# Patient Record
Sex: Male | Born: 1935 | Race: White | Hispanic: No | Smoking: Former smoker
Health system: Southern US, Community
[De-identification: ages and names within clinical notes are randomized; demographics above are authoritative.]

## PROBLEM LIST (undated history)

## (undated) DIAGNOSIS — N200 Calculus of kidney: Secondary | ICD-10-CM

## (undated) DIAGNOSIS — I714 Abdominal aortic aneurysm, without rupture, unspecified: Secondary | ICD-10-CM

## (undated) DIAGNOSIS — J189 Pneumonia, unspecified organism: Secondary | ICD-10-CM

## (undated) DIAGNOSIS — E785 Hyperlipidemia, unspecified: Secondary | ICD-10-CM

## (undated) DIAGNOSIS — I219 Acute myocardial infarction, unspecified: Secondary | ICD-10-CM

## (undated) DIAGNOSIS — J40 Bronchitis, not specified as acute or chronic: Secondary | ICD-10-CM

## (undated) DIAGNOSIS — I2510 Atherosclerotic heart disease of native coronary artery without angina pectoris: Secondary | ICD-10-CM

## (undated) DIAGNOSIS — H9190 Unspecified hearing loss, unspecified ear: Secondary | ICD-10-CM

## (undated) DIAGNOSIS — M1990 Unspecified osteoarthritis, unspecified site: Secondary | ICD-10-CM

## (undated) DIAGNOSIS — Z9109 Other allergy status, other than to drugs and biological substances: Secondary | ICD-10-CM

## (undated) DIAGNOSIS — K219 Gastro-esophageal reflux disease without esophagitis: Secondary | ICD-10-CM

## (undated) DIAGNOSIS — C801 Malignant (primary) neoplasm, unspecified: Secondary | ICD-10-CM

## (undated) DIAGNOSIS — I10 Essential (primary) hypertension: Secondary | ICD-10-CM

## (undated) DIAGNOSIS — I4510 Unspecified right bundle-branch block: Secondary | ICD-10-CM

## (undated) HISTORY — DX: Essential (primary) hypertension: I10

## (undated) HISTORY — DX: Unspecified osteoarthritis, unspecified site: M19.90

## (undated) HISTORY — PX: CARDIAC CATHETERIZATION: SHX172

## (undated) HISTORY — PX: COLONOSCOPY W/ POLYPECTOMY: SHX1380

## (undated) HISTORY — DX: Atherosclerotic heart disease of native coronary artery without angina pectoris: I25.10

## (undated) HISTORY — DX: Abdominal aortic aneurysm, without rupture: I71.4

## (undated) HISTORY — PX: EYE SURGERY: SHX253

## (undated) HISTORY — PX: HERNIA REPAIR: SHX51

## (undated) HISTORY — DX: Hyperlipidemia, unspecified: E78.5

## (undated) HISTORY — DX: Abdominal aortic aneurysm, without rupture, unspecified: I71.40

## (undated) HISTORY — DX: Malignant (primary) neoplasm, unspecified: C80.1

---

## 1966-07-25 DIAGNOSIS — N200 Calculus of kidney: Secondary | ICD-10-CM

## 1966-07-25 HISTORY — DX: Calculus of kidney: N20.0

## 1990-07-25 DIAGNOSIS — I219 Acute myocardial infarction, unspecified: Secondary | ICD-10-CM

## 1990-07-25 HISTORY — DX: Acute myocardial infarction, unspecified: I21.9

## 1991-03-01 HISTORY — PX: CORONARY ARTERY BYPASS GRAFT: SHX141

## 1993-08-13 HISTORY — PX: ABDOMINAL AORTIC ANEURYSM REPAIR: SUR1152

## 1994-04-20 HISTORY — PX: OTHER SURGICAL HISTORY: SHX169

## 1997-07-25 HISTORY — PX: OTHER SURGICAL HISTORY: SHX169

## 1997-11-18 ENCOUNTER — Ambulatory Visit (HOSPITAL_COMMUNITY): Admission: RE | Admit: 1997-11-18 | Discharge: 1997-11-18 | Payer: Self-pay | Admitting: *Deleted

## 1998-04-29 ENCOUNTER — Inpatient Hospital Stay (HOSPITAL_COMMUNITY): Admission: AD | Admit: 1998-04-29 | Discharge: 1998-05-01 | Payer: Self-pay | Admitting: Internal Medicine

## 1998-04-30 ENCOUNTER — Encounter: Payer: Self-pay | Admitting: General Surgery

## 1998-09-24 ENCOUNTER — Inpatient Hospital Stay (HOSPITAL_COMMUNITY): Admission: AD | Admit: 1998-09-24 | Discharge: 1998-10-01 | Payer: Self-pay | Admitting: *Deleted

## 1998-09-25 ENCOUNTER — Encounter: Payer: Self-pay | Admitting: General Surgery

## 1998-09-26 ENCOUNTER — Encounter: Payer: Self-pay | Admitting: Internal Medicine

## 1998-09-26 ENCOUNTER — Encounter: Payer: Self-pay | Admitting: General Surgery

## 1998-09-28 ENCOUNTER — Encounter: Payer: Self-pay | Admitting: Internal Medicine

## 1999-12-13 ENCOUNTER — Encounter: Payer: Self-pay | Admitting: *Deleted

## 1999-12-13 ENCOUNTER — Encounter: Admission: RE | Admit: 1999-12-13 | Discharge: 1999-12-13 | Payer: Self-pay | Admitting: *Deleted

## 2001-08-04 ENCOUNTER — Encounter: Payer: Self-pay | Admitting: Ophthalmology

## 2001-08-04 ENCOUNTER — Inpatient Hospital Stay (HOSPITAL_COMMUNITY): Admission: EM | Admit: 2001-08-04 | Discharge: 2001-08-05 | Payer: Self-pay | Admitting: Emergency Medicine

## 2004-09-09 ENCOUNTER — Ambulatory Visit: Payer: Self-pay | Admitting: *Deleted

## 2004-09-10 ENCOUNTER — Ambulatory Visit: Payer: Self-pay | Admitting: Internal Medicine

## 2004-09-15 ENCOUNTER — Ambulatory Visit: Payer: Self-pay | Admitting: *Deleted

## 2004-09-29 ENCOUNTER — Ambulatory Visit: Payer: Self-pay | Admitting: *Deleted

## 2004-10-29 ENCOUNTER — Ambulatory Visit: Payer: Self-pay | Admitting: *Deleted

## 2004-11-04 ENCOUNTER — Ambulatory Visit: Payer: Self-pay | Admitting: Cardiology

## 2005-01-28 ENCOUNTER — Ambulatory Visit: Payer: Self-pay | Admitting: *Deleted

## 2005-02-10 ENCOUNTER — Ambulatory Visit: Payer: Self-pay | Admitting: Cardiology

## 2005-03-08 ENCOUNTER — Ambulatory Visit: Payer: Self-pay

## 2005-03-22 ENCOUNTER — Ambulatory Visit: Payer: Self-pay | Admitting: *Deleted

## 2005-05-27 ENCOUNTER — Ambulatory Visit: Payer: Self-pay | Admitting: *Deleted

## 2005-08-31 ENCOUNTER — Ambulatory Visit: Payer: Self-pay | Admitting: *Deleted

## 2005-11-18 ENCOUNTER — Ambulatory Visit: Payer: Self-pay | Admitting: *Deleted

## 2005-11-24 ENCOUNTER — Ambulatory Visit: Payer: Self-pay | Admitting: Cardiology

## 2006-03-07 ENCOUNTER — Ambulatory Visit: Payer: Self-pay | Admitting: *Deleted

## 2006-03-08 ENCOUNTER — Ambulatory Visit: Payer: Self-pay

## 2006-04-26 ENCOUNTER — Encounter: Payer: Self-pay | Admitting: Cardiology

## 2006-04-26 ENCOUNTER — Ambulatory Visit: Payer: Self-pay | Admitting: *Deleted

## 2006-04-26 ENCOUNTER — Ambulatory Visit: Payer: Self-pay

## 2006-05-05 ENCOUNTER — Ambulatory Visit: Payer: Self-pay | Admitting: *Deleted

## 2006-05-05 LAB — CONVERTED CEMR LAB
ALT: 19 units/L (ref 0–40)
Albumin: 4.2 g/dL (ref 3.5–5.2)
Alkaline Phosphatase: 41 units/L (ref 39–117)
Chloride: 108 meq/L (ref 96–112)
Glucose, Bld: 100 mg/dL — ABNORMAL HIGH (ref 70–99)
LDL Cholesterol: 91 mg/dL (ref 0–99)
Potassium: 4.4 meq/L (ref 3.5–5.1)
Sodium: 140 meq/L (ref 135–145)
VLDL: 17 mg/dL (ref 0–40)

## 2006-05-11 ENCOUNTER — Ambulatory Visit: Payer: Self-pay | Admitting: Internal Medicine

## 2006-06-07 ENCOUNTER — Ambulatory Visit: Payer: Self-pay | Admitting: *Deleted

## 2006-06-08 ENCOUNTER — Ambulatory Visit: Payer: Self-pay

## 2006-10-20 ENCOUNTER — Ambulatory Visit: Payer: Self-pay | Admitting: *Deleted

## 2006-10-20 LAB — CONVERTED CEMR LAB
Alkaline Phosphatase: 42 units/L (ref 39–117)
Bilirubin, Direct: 0.2 mg/dL (ref 0.0–0.3)
Cholesterol: 123 mg/dL (ref 0–200)
LDL Cholesterol: 74 mg/dL (ref 0–99)
Total CHOL/HDL Ratio: 4.5
Total Protein: 6.2 g/dL (ref 6.0–8.3)

## 2006-10-26 ENCOUNTER — Ambulatory Visit: Payer: Self-pay | Admitting: Cardiology

## 2007-01-03 ENCOUNTER — Ambulatory Visit: Payer: Self-pay | Admitting: *Deleted

## 2007-01-03 LAB — CONVERTED CEMR LAB
Calcium: 9.9 mg/dL (ref 8.4–10.5)
Chloride: 103 meq/L (ref 96–112)
GFR calc non Af Amer: 77 mL/min
Glucose, Bld: 92 mg/dL (ref 70–99)

## 2007-04-12 ENCOUNTER — Ambulatory Visit: Payer: Self-pay | Admitting: *Deleted

## 2007-04-20 ENCOUNTER — Ambulatory Visit: Payer: Self-pay | Admitting: Cardiovascular Disease

## 2007-04-20 LAB — CONVERTED CEMR LAB
Bilirubin, Direct: 0.2 mg/dL (ref 0.0–0.3)
Cholesterol: 143 mg/dL (ref 0–200)
HDL: 24.5 mg/dL — ABNORMAL LOW (ref >39.0)
Total Bilirubin: 1.1 mg/dL (ref 0.3–1.2)
Total CHOL/HDL Ratio: 5.8
Total Protein: 6.2 g/dL (ref 6.0–8.3)
Triglycerides: 115 mg/dL (ref 0–149)

## 2007-04-26 ENCOUNTER — Ambulatory Visit: Payer: Self-pay | Admitting: Cardiology

## 2007-05-01 ENCOUNTER — Ambulatory Visit: Payer: Self-pay | Admitting: Cardiovascular Disease

## 2007-05-29 ENCOUNTER — Ambulatory Visit: Payer: Self-pay

## 2007-10-05 ENCOUNTER — Ambulatory Visit: Payer: Self-pay | Admitting: Cardiovascular Disease

## 2007-10-05 LAB — CONVERTED CEMR LAB
ALT: 19 units/L (ref 0–53)
AST: 15 units/L (ref 0–37)
Bilirubin, Direct: 0.2 mg/dL (ref 0.0–0.3)
Cholesterol: 140 mg/dL (ref 0–200)
HDL: 25.3 mg/dL — ABNORMAL LOW (ref >39.0)
Total Protein: 6.1 g/dL (ref 6.0–8.3)

## 2007-10-11 ENCOUNTER — Ambulatory Visit: Payer: Self-pay | Admitting: Cardiology

## 2007-12-28 ENCOUNTER — Ambulatory Visit: Payer: Self-pay | Admitting: Cardiovascular Disease

## 2007-12-28 LAB — CONVERTED CEMR LAB
ALT: 16 units/L (ref 0–53)
AST: 15 units/L (ref 0–37)
Bilirubin, Direct: 0.2 mg/dL (ref 0.0–0.3)
Total Bilirubin: 0.8 mg/dL (ref 0.3–1.2)
Total CHOL/HDL Ratio: 5.2

## 2008-01-03 ENCOUNTER — Ambulatory Visit: Payer: Self-pay | Admitting: Cardiology

## 2008-04-17 ENCOUNTER — Ambulatory Visit: Payer: Self-pay | Admitting: *Deleted

## 2008-06-03 ENCOUNTER — Ambulatory Visit: Admission: RE | Admit: 2008-06-03 | Discharge: 2008-08-29 | Payer: Self-pay | Admitting: Radiation Oncology

## 2008-06-20 ENCOUNTER — Ambulatory Visit: Payer: Self-pay | Admitting: Cardiology

## 2008-06-20 LAB — CONVERTED CEMR LAB
Albumin: 4 g/dL (ref 3.5–5.2)
Alkaline Phosphatase: 38 units/L — ABNORMAL LOW (ref 39–117)
Bilirubin, Direct: 0.2 mg/dL (ref 0.0–0.3)
LDL Cholesterol: 75 mg/dL (ref 0–99)
Total CHOL/HDL Ratio: 3.8
Triglycerides: 100 mg/dL (ref 0–149)
VLDL: 20 mg/dL (ref 0–40)

## 2008-06-26 ENCOUNTER — Ambulatory Visit: Payer: Self-pay | Admitting: Cardiology

## 2008-07-07 ENCOUNTER — Ambulatory Visit: Payer: Self-pay | Admitting: Cardiovascular Disease

## 2008-07-25 DIAGNOSIS — Z8546 Personal history of malignant neoplasm of prostate: Secondary | ICD-10-CM

## 2008-07-25 DIAGNOSIS — C801 Malignant (primary) neoplasm, unspecified: Secondary | ICD-10-CM

## 2008-07-25 HISTORY — DX: Personal history of malignant neoplasm of prostate: Z85.46

## 2008-07-25 HISTORY — DX: Malignant (primary) neoplasm, unspecified: C80.1

## 2008-08-29 ENCOUNTER — Ambulatory Visit: Admission: RE | Admit: 2008-08-29 | Discharge: 2008-10-10 | Payer: Self-pay | Admitting: Radiation Oncology

## 2008-12-19 ENCOUNTER — Ambulatory Visit: Payer: Self-pay | Admitting: Cardiovascular Disease

## 2008-12-25 ENCOUNTER — Ambulatory Visit: Payer: Self-pay | Admitting: Internal Medicine

## 2008-12-26 LAB — CONVERTED CEMR LAB
ALT: 14 units/L (ref 0–53)
AST: 17 units/L (ref 0–37)
Albumin: 3.9 g/dL (ref 3.5–5.2)
HDL: 33.9 mg/dL — ABNORMAL LOW (ref >39.00)
Total Bilirubin: 0.9 mg/dL (ref 0.3–1.2)
Total Protein: 6.3 g/dL (ref 6.0–8.3)
Triglycerides: 73 mg/dL (ref 0.0–149.0)
VLDL: 14.6 mg/dL (ref 0.0–40.0)

## 2008-12-28 DIAGNOSIS — E782 Mixed hyperlipidemia: Secondary | ICD-10-CM | POA: Insufficient documentation

## 2009-01-16 DIAGNOSIS — I739 Peripheral vascular disease, unspecified: Secondary | ICD-10-CM | POA: Insufficient documentation

## 2009-01-16 DIAGNOSIS — Z9889 Other specified postprocedural states: Secondary | ICD-10-CM | POA: Insufficient documentation

## 2009-01-16 DIAGNOSIS — E785 Hyperlipidemia, unspecified: Secondary | ICD-10-CM | POA: Insufficient documentation

## 2009-01-16 DIAGNOSIS — I25810 Atherosclerosis of coronary artery bypass graft(s) without angina pectoris: Secondary | ICD-10-CM | POA: Insufficient documentation

## 2009-01-16 DIAGNOSIS — I10 Essential (primary) hypertension: Secondary | ICD-10-CM | POA: Insufficient documentation

## 2009-04-01 ENCOUNTER — Ambulatory Visit: Payer: Self-pay | Admitting: Cardiovascular Disease

## 2009-04-02 ENCOUNTER — Telehealth: Payer: Self-pay | Admitting: Cardiovascular Disease

## 2009-04-06 ENCOUNTER — Telehealth: Payer: Self-pay | Admitting: Cardiovascular Disease

## 2009-04-13 ENCOUNTER — Telehealth: Payer: Self-pay | Admitting: Cardiovascular Disease

## 2009-04-24 ENCOUNTER — Ambulatory Visit: Payer: Self-pay | Admitting: Vascular Surgery

## 2009-05-22 ENCOUNTER — Ambulatory Visit: Payer: Self-pay | Admitting: Cardiology

## 2009-05-28 ENCOUNTER — Ambulatory Visit: Payer: Self-pay | Admitting: Internal Medicine

## 2009-06-02 LAB — CONVERTED CEMR LAB
ALT: 14 units/L (ref 0–53)
AST: 19 units/L (ref 0–37)
Albumin: 3.8 g/dL (ref 3.5–5.2)
Alkaline Phosphatase: 48 units/L (ref 39–117)
Bilirubin, Direct: 0.2 mg/dL (ref 0.0–0.3)
Cholesterol: 141 mg/dL (ref 0–200)
LDL Cholesterol: 95 mg/dL (ref 0–99)
Total Protein: 6 g/dL (ref 6.0–8.3)
Triglycerides: 69 mg/dL (ref 0.0–149.0)

## 2009-08-28 ENCOUNTER — Ambulatory Visit: Payer: Self-pay | Admitting: Cardiovascular Disease

## 2009-08-29 LAB — CONVERTED CEMR LAB
ALT: 18 units/L (ref 0–53)
AST: 16 units/L (ref 0–37)
Alkaline Phosphatase: 52 units/L (ref 39–117)
Bilirubin, Direct: 0.2 mg/dL (ref 0.0–0.3)
Total Bilirubin: 0.7 mg/dL (ref 0.3–1.2)
Total CHOL/HDL Ratio: 4

## 2009-09-03 ENCOUNTER — Ambulatory Visit: Payer: Self-pay | Admitting: Cardiology

## 2009-10-09 ENCOUNTER — Emergency Department (HOSPITAL_COMMUNITY): Admission: EM | Admit: 2009-10-09 | Discharge: 2009-10-09 | Payer: Self-pay | Admitting: Emergency Medicine

## 2009-12-18 ENCOUNTER — Ambulatory Visit: Payer: Self-pay | Admitting: Vascular Surgery

## 2009-12-18 ENCOUNTER — Encounter: Payer: Self-pay | Admitting: Cardiovascular Disease

## 2009-12-29 ENCOUNTER — Ambulatory Visit: Payer: Self-pay | Admitting: Cardiovascular Disease

## 2010-01-28 ENCOUNTER — Ambulatory Visit (HOSPITAL_COMMUNITY): Admission: RE | Admit: 2010-01-28 | Discharge: 2010-01-28 | Payer: Self-pay | Admitting: Gastroenterology

## 2010-02-26 ENCOUNTER — Ambulatory Visit: Payer: Self-pay | Admitting: Cardiovascular Disease

## 2010-03-02 LAB — CONVERTED CEMR LAB
Albumin: 4.1 g/dL (ref 3.5–5.2)
Alkaline Phosphatase: 81 units/L (ref 39–117)
Cholesterol: 152 mg/dL (ref 0–200)
HDL: 25.3 mg/dL — ABNORMAL LOW (ref >39.00)
LDL Cholesterol: 107 mg/dL — ABNORMAL HIGH (ref 0–99)
Total CHOL/HDL Ratio: 6
Total Protein: 6.2 g/dL (ref 6.0–8.3)
Triglycerides: 98 mg/dL (ref 0.0–149.0)

## 2010-03-04 ENCOUNTER — Ambulatory Visit: Payer: Self-pay | Admitting: Cardiovascular Disease

## 2010-06-16 ENCOUNTER — Ambulatory Visit: Payer: Self-pay | Admitting: Vascular Surgery

## 2010-08-26 NOTE — Assessment & Plan Note (Signed)
Summary: rov-tp   Visit Type:  Follow-up Primary Provider:  Lanell Persons, MD   History of Present Illness: [name] has been doing reasonably well.  Still adjusting to being a widower and cooking for himself.  He has been trying to walk for exercise but not very consistently. He denies any new or significant muscle aches or pains.  He has been compliant with his medications including simvastatin 40mg  and fenofibrate 200mg .   Lipid Management History:      Positive NCEP/ATP III risk factors include male age 75 years old or older, HDL cholesterol less than 40, hypertension, ASHD (either angina/prior MI/prior CABG), and peripheral vascular disease.  Negative NCEP/ATP III risk factors include non-diabetic.     Jesse [name] is seen back in the lipid clinic for continued evaluation and medication titration associated with his mixed hyperlipidemia.  Jesse [name] relates that his wife passed away in 09/10/2022 of this year, and states that he has not exercised since his cancer diagnosis earlier this year.  He completed 40 radiation treatments in March 2010.    Diet therapy:  Jesse [name] has been eating out more at [employer], and works to select low-fat and low-cholesterol foods.  When at home, he has selected soups, sandwiches, and healthy choice meal selections.   Jesse [name] has maintained compliance and tolerance of crestor and fenofibrate throughout this period.    Lipid Management Provider  Charolotte Eke, PharmD  Current Medications (verified): 1)  Carvedilol 12.5 Mg Tabs (Carvedilol) .... Take One Tablet By Mouth Twice A Day 2)  Cozaar 50 Mg Tabs (Losartan Potassium) .... Take One Tablet By Mouth Daily 3)  Simvastatin 40 Mg Tabs (Simvastatin) .... Take One Tablet By Mouth Daily At Bedtime 4)  Fenofibrate Micronized 200 Mg Caps (Fenofibrate Micronized) .... Take 1 Tablet By Mouth Daily 5)  Aspirin 81 Mg Tbec (Aspirin) .... Take One Tablet By Mouth Daily 6)  Claritin 10 Mg Tabs (Loratadine) ....  Take 1 Tablet Day 7)  Acetaminophen 500 Mg  Caps (Acetaminophen) .... As Needed 8)  Hydrochlorothiazide 12.5 Mg Tabs (Hydrochlorothiazide) .... Take One Tablet By Mouth Daily.  Allergies (verified): No Known Drug Allergies   Vital Signs:  Patient profile:   77 year old male Weight:      203 pounds Pulse rate:   72 / minute BP sitting:   120 / 80  (left arm)  Impression & Recommendations:  Problem # 1:  HYPERLIPIDEMIA, BORDERLINE (ICD-272.4) His TC, LDL, and TG are at goals.  HDL is improved but not quite at the goal of >40.  His liver enzymes are normal.  I encouraged him to continue to follow a heart healthy diet and exercise the best he can.  We will see him again in six months.  His updated medication list for this problem includes:    Simvastatin 40 Mg Tabs (Simvastatin) .Marland Kitchen... Take one tablet by mouth daily at bedtime    Fenofibrate Micronized 200 Mg Caps (Fenofibrate micronized) .Marland Kitchen... Take 1 tablet by mouth daily  Lipid Assessment/Plan:      Based on NCEP/ATP III, the patient's risk factor category is "history of coronary disease, peripheral vascular disease, cerebrovascular disease, or aortic aneurysm".  The patient's lipid goals are as follows: Total cholesterol goal is 200; LDL cholesterol goal is 100; HDL cholesterol goal is 40; Triglyceride goal is 150.  His LDL cholesterol goal has been met.

## 2010-08-26 NOTE — Assessment & Plan Note (Signed)
Summary: rov-sl   Jesse [name] is seen back in the lipid clinic for a f/u appointment. Still taking simvastatin 40 mg and fenofibrate 200 mg. No complaints of muscle pain or weakness and no problems obtaining medications.   Diet: Jesse [name] has had difficulty preparing meals at home since his wife passed away in 10/02/08. He eats meals out 3-4x/week, often at [employer]. He chooses vegetables and white meats including chicken and [place]. At home, Jesse [name] prepares low-fat, low-calorie frozen meals such as [employer], [employer], [employer] Choice.  Breakfast: egg beaters 1-2x/week, [employer] sausage biscuits, [place] bacon Snacks: Nabs  ~ peanut butter and crackers Beverages: Sweet tea, coffee, diet coke  Exercise: Jesse [name] has not been able to maintain his exercise routine over the past month due to the heat. He enjoys riding his bike and hopes to resume this activity 2-3x/week once the weather cools off. He goes to the [employer], but does not like to use the treadmill or weights available there.       Lipid Management Caidence Higashi  Jesse [name], PharmD  Preventive Screening-Counseling & Management  Alcohol-Tobacco     Smoking Status: quit > 6 months     Year Quit: 1971  Current Medications (verified): 1)  Carvedilol 12.5 Mg Tabs (Carvedilol) .... Take One Tablet By Mouth Twice A Day 2)  Cozaar 50 Mg Tabs (Losartan Potassium) .... Take One Tablet By Mouth Daily 3)  Simvastatin 40 Mg Tabs (Simvastatin) .... Take One Tablet By Mouth Daily At Bedtime 4)  Fenofibrate Micronized 200 Mg Caps (Fenofibrate Micronized) .... Take 1 Tablet By Mouth Daily 5)  Claritin 10 Mg Tabs (Loratadine) .... Take 1 Tablet Day 6)  Acetaminophen 500 Mg  Caps (Acetaminophen) .... As Needed 7)  Hydrochlorothiazide 12.5 Mg Tabs (Hydrochlorothiazide) .... Take One Tablet By Mouth Daily.  Allergies (verified): No Known Drug Allergies  Past History:  Past medical, surgical, family and social histories  (including risk factors) reviewed, and no changes noted (except as noted below).  Past Medical History: Reviewed history from 01/16/2009 and no changes required. Current Problems:  CAD, ARTERY BYPASS GRAFT (ICD-414.04)- CABG in 1992 PERIPHERAL VASCULAR DISEASE (ICD-443.9) ABDOMINAL AORTIC ANEURYSM REPAIR, HX OF (ICD-V15.1) HYPERTENSION (ICD-401.9) HYPERLIPIDEMIA, BORDERLINE (ICD-272.4) MIXED HYPERLIPIDEMIA (ICD-272.2)  Past Surgical History: Reviewed history from 01/16/2009 and no changes required.  coronary artery bypass graft in 1992  Abdominal aortic aneurysm repair  cataract implant surgery.  ventral hernia repair  Family History: Reviewed history and no changes required.  Social History: Reviewed history from 01/16/2009 and no changes required. Widowed in 2/10.   Lives alone.   He is a retired Psychologist, occupational. He has two children and two grandchildren.  He does not use cigarettes. He does not drink alcohol.    Smoking Status:  quit > 6 months   Vital Signs:  Patient profile:   77 year old male Height:      72 inches Weight:      204 pounds BMI:     27.77 BP sitting:   120 / 78  Impression & Recommendations:  Problem # 1:  HYPERLIPIDEMIA, BORDERLINE (ICD-272.4) Assessment Unchanged Lipids: LDL currently close to goal (LDL < 100). HDL below goal of 40. TC and TG at goal.  TC 152, TG 98, HDL 25, LDL 107.  LFTs WNL. Pt currently compliant and tolerating medication regimen well. Continue current medications.  His updated medication list for this problem includes:    Simvastatin 40 Mg Tabs (Simvastatin) .Marland Kitchen... Take  one tablet by mouth daily at bedtime    Fenofibrate Micronized 200 Mg Caps (Fenofibrate micronized) .Marland Kitchen... Take 1 tablet by mouth daily Exercise: Pt has not exercised over the past month due to the heat, but plans to resume exercise regimen of biking 2-3x/week. Discussed benefits of exercise including increasing HDL.  Diet: Pt attempts to eat a low-fat/low-cal diet,  but has difficulty preparing meals for himself at home. Pt to continue to make healthy choices when eating out and choosing frozen dinners. Discussed excess sodium in frozen meals. Return to clinic for f/u in 6 months.   Patient Instructions: 1)  Lipids: 2)  Total Cholesterol: 152 3)  Triglycerides: 98 4)  HDL ("good" cholesterol): 25 5)  LDL ("bad" cholesterol): 107 6)  Goals before next visit: 7)  1.) Thanks for being here today! 8)  2.) Increase exercise: get out and ride bike or walk once it cools off 9)  3.) Continue to watch diet and intake of fats 10)  Next Visit: 11)  Labs @ [facility]: Friday, March 9th at 8:30 AM 12)  Lipid Clinic: Thursday, March 15th at 1:30

## 2010-08-26 NOTE — Assessment & Plan Note (Signed)
Summary: 9 MONTH      Allergies Added: NKDA  Visit Type:  9 months follow up Primary Provider:  Dr Clarene Duke [facility]  CC:  No complains.  History of Present Illness: [name] is a 77 year old gentleman with coronary artery disease, status post CABG in 1992 as well as previous AAA repair.  He had a followup cardiac cath in 1999, that showed a patent LIMA as well as patent vein grafts to the diagonal OM and sequence graft to the PDA and posterolateral branches of the right coronary artery.  His last Myoview scan was in 2007, that showed a gated LVEF of 44%.  There was a fixed defect in the inferolateral wall, but no ischemia.    He is doing well at present and has no cardiac c/o. He denies chest pain, dyspnea, edema, lightheadedness, or palps.   Current Medications (verified): 1)  Carvedilol 12.5 Mg Tabs (Carvedilol) .... Take One Tablet By Mouth Twice A Day 2)  Cozaar 50 Mg Tabs (Losartan Potassium) .... Take One Tablet By Mouth Daily 3)  Simvastatin 40 Mg Tabs (Simvastatin) .... Take One Tablet By Mouth Daily At Bedtime 4)  Fenofibrate Micronized 200 Mg Caps (Fenofibrate Micronized) .... Take 1 Tablet By Mouth Daily 5)  Claritin 10 Mg Tabs (Loratadine) .... Take 1 Tablet Day 6)  Acetaminophen 500 Mg  Caps (Acetaminophen) .... As Needed 7)  Hydrochlorothiazide 12.5 Mg Tabs (Hydrochlorothiazide) .... Take One Tablet By Mouth Daily.  Allergies (verified): No Known Drug Allergies  Past History:  Past medical history reviewed for relevance to current acute and chronic problems.  Past Medical History: Reviewed history from 01/16/2009 and no changes required. Current Problems:  CAD, ARTERY BYPASS GRAFT (ICD-414.04)- CABG in 1992 PERIPHERAL VASCULAR DISEASE (ICD-443.9) ABDOMINAL AORTIC ANEURYSM REPAIR, HX OF (ICD-V15.1) HYPERTENSION (ICD-401.9) HYPERLIPIDEMIA, BORDERLINE (ICD-272.4) MIXED HYPERLIPIDEMIA (ICD-272.2)  Review of Systems       Positive for mild  rectal bleeding, sinus congestion, otherwise negative except as per history of present illness.   Vital Signs:  Patient profile:   77 year old male Height:      72 inches Weight:      202 pounds BMI:     27.50 Pulse rate:   63 / minute Pulse rhythm:   regular Resp:     18 per minute BP sitting:   122 / 72  (left arm) Cuff size:   large  Vitals Entered By: Vikki Ports (December 29, 2009 4:19 PM)  Physical Exam  General:  Pt is alert and oriented, elderly male, in no acute distress. HEENT: normal Neck: normal carotid upstrokes without bruits, JVP normal Lungs: CTA CV: RRR without murmur or gallop Abd: soft, NT, positive BS, no bruit, no organomegaly Ext: no clubbing, cyanosis, or edema. peripheral pulses 2+ and equal Skin: warm and dry without rash    EKG  Procedure date:  12/29/2009  Findings:      NSR with LBBB, HR 63 bpm.  Impression & Recommendations:  Problem # 1:  CAD, ARTERY BYPASS GRAFT (ICD-414.04) Stable without angina. continue current medical therapy.  The following medications were removed from the medication list:    Aspirin 81 Mg Tbec (Aspirin) .Marland Kitchen... Take one tablet by mouth daily His updated medication list for this problem includes:    Carvedilol 12.5 Mg Tabs (Carvedilol) .Marland Kitchen... Take one tablet by mouth twice a day  Orders: EKG w/ Interpretation (93000)  Problem # 2:  HYPERTENSION (ICD-401.9) BP well-controlled.  The following  medications were removed from the medication list:    Aspirin 81 Mg Tbec (Aspirin) .Marland Kitchen... Take one tablet by mouth daily His updated medication list for this problem includes:    Carvedilol 12.5 Mg Tabs (Carvedilol) .Marland Kitchen... Take one tablet by mouth twice a day    Cozaar 50 Mg Tabs (Losartan potassium) .Marland Kitchen... Take one tablet by mouth daily    Hydrochlorothiazide 12.5 Mg Tabs (Hydrochlorothiazide) .Marland Kitchen... Take one tablet by mouth daily.  Orders: EKG w/ Interpretation (93000)  BP today: 122/72 Prior BP: 120/80  (09/03/2009)  Prior 10 Yr Risk Heart Disease: N/A (09/03/2009)  Labs Reviewed: K+: 4.3 (01/03/2007) Creat: : 1.0 (01/03/2007)   Chol: 139 (08/28/2009)   HDL: 37.20 (08/28/2009)   LDL: 89 (08/28/2009)   TG: 64.0 (08/28/2009)  Problem # 3:  MIXED HYPERLIPIDEMIA (ICD-272.2) Lipids at goal, followed by the lipid clinic. We discussed the importance of increased exercise. Diet has been difficult after becoming a widower. His updated medication list for this problem includes:    Simvastatin 40 Mg Tabs (Simvastatin) .Marland Kitchen... Take one tablet by mouth daily at bedtime    Fenofibrate Micronized 200 Mg Caps (Fenofibrate micronized) .Marland Kitchen... Take 1 tablet by mouth daily  CHOL: 139 (08/28/2009)   LDL: 89 (08/28/2009)   HDL: 37.20 (08/28/2009)   TG: 64.0 (08/28/2009) CHOL (goal): 200 (09/03/2009)   LDL (goal): 100 (09/03/2009)   HDL (goal): 40 (09/03/2009)   TG (goal): 150 (09/03/2009)  Patient Instructions: 1)  Your physician recommends that you continue on your current medications as directed. Please refer to the Current Medication list given to you today. 2)  Your physician wants you to follow-up in:  [facility] 2012. You will receive a reminder letter in the mail two months in advance. If you don't receive a letter, please call our office to schedule the follow-up appointment. 3)  Your physician discussed the importance of regular exercise and recommended that you start or continue a regular exercise program for good health.

## 2010-08-26 NOTE — Letter (Signed)
Summary: [facility]  [facility]   Imported By: Debby Freiberg 01/26/2010 13:25:41  _____________________________________________________________________  External Attachment:    Type:   Image     Comment:   External Document

## 2010-09-23 ENCOUNTER — Encounter: Payer: Self-pay | Admitting: Cardiovascular Disease

## 2010-09-23 ENCOUNTER — Ambulatory Visit (INDEPENDENT_AMBULATORY_CARE_PROVIDER_SITE_OTHER): Payer: Medicare Other | Admitting: Cardiovascular Disease

## 2010-09-23 DIAGNOSIS — E7800 Pure hypercholesterolemia, unspecified: Secondary | ICD-10-CM

## 2010-09-23 DIAGNOSIS — I2510 Atherosclerotic heart disease of native coronary artery without angina pectoris: Secondary | ICD-10-CM

## 2010-09-30 NOTE — Assessment & Plan Note (Signed)
Summary: [id number]/LWB//FROM 12/29/09 OV/LWB/PER PT CALL/MJ/SAF   Visit Type:  Follow-up Primary Provider:  Dr Clarene Duke [facility]  CC:  no complaints.  History of Present Illness: Mr. Crumby is an 77 year old gentleman with coronary artery disease, status post CABG in 1992 as well as previous AAA repair.  He had a followup cardiac cath in 1999, that showed a patent LIMA as well as patent vein grafts to the diagonal OM and sequence graft to the PDA and posterolateral branches of the right coronary artery.  His last Myoview scan was in 2007, that showed a gated LVEF of 44%.  There was a fixed defect in the inferolateral wall, but no ischemia.    The patient is doing well at present. He denies chest pain, dyspnea, edema, orthopnea, PND, or palpitations.  Current Medications (verified): 1)  Carvedilol 12.5 Mg Tabs (Carvedilol) .... Take One Tablet By Mouth Twice A Day 2)  Cozaar 50 Mg Tabs (Losartan Potassium) .... Take One Tablet By Mouth Daily 3)  Simvastatin 40 Mg Tabs (Simvastatin) .... Take One Tablet By Mouth Daily At Bedtime 4)  Fenofibrate Micronized 200 Mg Caps (Fenofibrate Micronized) .... Take 1 Tablet By Mouth Daily 5)  Claritin 10 Mg Tabs (Loratadine) .... Take 1 Tablet Day 6)  Acetaminophen 500 Mg  Caps (Acetaminophen) .... As Needed 7)  Hydrochlorothiazide 12.5 Mg Tabs (Hydrochlorothiazide) .... Take One Tablet By Mouth Daily.  Allergies (verified): No Known Drug Allergies  Past History:  Past medical history reviewed for relevance to current acute and chronic problems.  Past Medical History: Reviewed history from 01/16/2009 and no changes required. Current Problems:  CAD, ARTERY BYPASS GRAFT (ICD-414.04)- CABG in 1992 PERIPHERAL VASCULAR DISEASE (ICD-443.9) ABDOMINAL AORTIC ANEURYSM REPAIR, HX OF (ICD-V15.1) HYPERTENSION (ICD-401.9) HYPERLIPIDEMIA, BORDERLINE (ICD-272.4) MIXED HYPERLIPIDEMIA (ICD-272.2)  Review of Systems       Negative except as per  HPI   Vital Signs:  Patient profile:   77 year old male Height:      72 inches Weight:      205 pounds BMI:     27.90 Pulse rate:   69 / minute Resp:     16 per minute BP sitting:   102 / 64  (left arm) Cuff size:   regular  Vitals Entered By: Hardin Negus, RMA (September 23, 2010 1:34 PM)  Physical Exam  General:  Pt is alert and oriented, elderly male, in no acute distress. HEENT: normal Neck: normal carotid upstrokes without bruits, JVP normal Lungs: CTA CV: RRR without murmur or gallop Abd: soft, obese, NT, positive BS, no organomegaly Ext: no clubbing, cyanosis, or edema. peripheral pulses 2+ and equal Skin: warm and dry without rash    EKG  Procedure date:  09/23/2010  Findings:      NSR, nonspecific IVCD, cannot rule out age-indeterminate inferior infarction. HR 69 bpm.  Impression & Recommendations:  Problem # 1:  CAD, ARTERY BYPASS GRAFT (ICD-414.04) Pt stable without angina. He is 20 years out from CABG. We discussed surveillance stress testing and have decided not to do this since he remains asymptomatic. Continue current medical program.  His updated medication list for this problem includes:    Carvedilol 12.5 Mg Tabs (Carvedilol) .Marland Kitchen... Take one tablet by mouth twice a day  Problem # 2:  HYPERTENSION (ICD-401.9) BP well-controlled. Continue current Rx.  His updated medication list for this problem includes:    Carvedilol 12.5 Mg Tabs (Carvedilol) .Marland Kitchen... Take one tablet by mouth twice a day  Cozaar 50 Mg Tabs (Losartan potassium) .Marland Kitchen... Take one tablet by mouth daily    Hydrochlorothiazide 12.5 Mg Tabs (Hydrochlorothiazide) .Marland Kitchen... Take one tablet by mouth daily.  BP today: 102/64 Prior BP: 120/78 (03/04/2010)  Prior 10 Yr Risk Heart Disease: N/A (09/03/2009)  Labs Reviewed: K+: 4.3 (01/03/2007) Creat: : 1.0 (01/03/2007)   Chol: 152 (02/26/2010)   HDL: 25.30 (02/26/2010)   LDL: 107 (02/26/2010)   TG: 98.0 (02/26/2010)  Problem # 3:  HYPERLIPIDEMIA,  BORDERLINE (ICD-272.4) A little above goal. Discussed dietary modification. He follows with the lipid clinic.  His updated medication list for this problem includes:    Simvastatin 40 Mg Tabs (Simvastatin) .Marland Kitchen... Take one tablet by mouth daily at bedtime    Fenofibrate Micronized 200 Mg Caps (Fenofibrate micronized) .Marland Kitchen... Take 1 tablet by mouth daily  CHOL: 152 (02/26/2010)   LDL: 107 (02/26/2010)   HDL: 25.30 (02/26/2010)   TG: 98.0 (02/26/2010) CHOL (goal): 200 (09/03/2009)   LDL (goal): 100 (09/03/2009)   HDL (goal): 40 (09/03/2009)   TG (goal): 150 (09/03/2009)  Patient Instructions: 1)  Your physician recommends that you schedule a follow-up appointment in: 6 months

## 2010-10-01 ENCOUNTER — Other Ambulatory Visit: Payer: Self-pay

## 2010-10-07 ENCOUNTER — Ambulatory Visit (INDEPENDENT_AMBULATORY_CARE_PROVIDER_SITE_OTHER): Payer: Medicare Other

## 2010-10-07 DIAGNOSIS — I4891 Unspecified atrial fibrillation: Secondary | ICD-10-CM

## 2010-10-11 ENCOUNTER — Other Ambulatory Visit: Payer: Self-pay | Admitting: Cardiovascular Disease

## 2010-10-11 ENCOUNTER — Encounter (INDEPENDENT_AMBULATORY_CARE_PROVIDER_SITE_OTHER): Payer: Self-pay | Admitting: *Deleted

## 2010-10-11 ENCOUNTER — Other Ambulatory Visit: Payer: Medicare Other

## 2010-10-11 DIAGNOSIS — E7800 Pure hypercholesterolemia, unspecified: Secondary | ICD-10-CM

## 2010-10-11 DIAGNOSIS — Z79899 Other long term (current) drug therapy: Secondary | ICD-10-CM

## 2010-10-11 LAB — HEPATIC FUNCTION PANEL
ALT: 18 U/L (ref 0–53)
AST: 16 U/L (ref 0–37)
Bilirubin, Direct: 0.2 mg/dL (ref 0.0–0.3)
Total Bilirubin: 0.9 mg/dL (ref 0.3–1.2)
Total Protein: 6.2 g/dL (ref 6.0–8.3)

## 2010-10-11 LAB — LIPID PANEL
Cholesterol: 143 mg/dL (ref 0–200)
HDL: 30.9 mg/dL — ABNORMAL LOW (ref >39.00)
VLDL: 16.8 mg/dL (ref 0.0–40.0)

## 2010-10-14 ENCOUNTER — Ambulatory Visit (INDEPENDENT_AMBULATORY_CARE_PROVIDER_SITE_OTHER): Payer: Medicare Other

## 2010-10-14 VITALS — BP 118/64 | Wt 202.0 lb

## 2010-10-14 DIAGNOSIS — E785 Hyperlipidemia, unspecified: Secondary | ICD-10-CM

## 2010-10-14 DIAGNOSIS — E7800 Pure hypercholesterolemia, unspecified: Secondary | ICD-10-CM

## 2010-10-14 NOTE — Patient Instructions (Addendum)
1)  It was a pleasure to meet with you today! 2)  Your labwork looks great, keep up the good work! 3)  Attempt to limit frozen dinners in order to limit sodium intake 4)  Consider joining the "Silver Sneakers" program at [employer] (cost covered by [employer]) 5)  Return to clinic in 6 months

## 2010-10-14 NOTE — Progress Notes (Signed)
Subjective:  Jesse [name] is a 77 y.o. male who presents for follow-up of dyslipidemia. Current lipid regimen includes Simvastatin 40 mg daily and fenofibrate 200 mg daily.  He reports compliance and denies any adverse events.   Diet:  Jesse [name] lost his wife about 2 years ago and he mentioned her several times throughout our visit.  She prepared all meals for him and now he has had to learn to prepare his own meals.  He eats frozen dinners ([employer], [employer], etc.) 4-5 nights per week and eats out at [employer] the other nights.  His favorite dishes there are Chicken pot pie, [place] bell peppers, and other chicken/turkey dinners.  He also gets a dessert each night.  The biggest concern regarding diet is his sodium intake due to many frozen dinners.  I have cautioned him about this and he states that "this is the best he can do given the circumstances."    Exercise: Patent exercises only on rare occasion.  He is aware of the importance of exercise and would like to do so.  His barriers are significant allergic rhinitis, sensitivity to the sun, and fatigue and disinterest after losing his wife.  He states that after losing her, the "wind" was knocked out of him and he simply hasn't been able to recover.  I have encouraged him to find some activity that he enjoys and to increase physical activity as tolerated.  I informed him of the Silver Sneaker program at the [facility] and he was very interested in this.    Previous history of cardiac disease includes: CABG, coronary artery disease and previous M.I..  Patient does not smoke.   Lipid Values: TC 143 (goal <200), TG 84 (goal <150), HDL 30.9 (goal>40), LDL 95 (goal <100, optimal <70).    Review of Systems Pertinent items are noted in HPI.   Assessment: TC, TG, and LDL are at goal, HDL is below goal, but patient has never been very successful at increasing this.  Patient does very well for his age but could use some improvements in diet and exercise.  He  lacks motivation due to loss of wife ~2 yrs ago.  He mentioned her multiple times during the visit.  I do not feel the patient needs med changes at this time.  I also have attempted to encourage him regarding improved diet and exercise.    Plan: 1)  It was a pleasure to meet with you today! 2)  Your labwork looks great, keep up the good work! 3)  Attempt to limit frozen dinners in order to limit sodium intake 4)  Consider joining the "Silver Sneakers" program at [employer] (cost covered by [employer]) 5)  Return to clinic in 6 months

## 2010-10-18 LAB — URINALYSIS, ROUTINE W REFLEX MICROSCOPIC
Glucose, UA: NEGATIVE mg/dL
Hgb urine dipstick: NEGATIVE
Ketones, ur: NEGATIVE mg/dL
Protein, ur: NEGATIVE mg/dL
Urobilinogen, UA: 1 mg/dL (ref 0.0–1.0)

## 2010-12-07 NOTE — Procedures (Signed)
LOWER EXTREMITY ARTERIAL DUPLEX   INDICATION:  Follow up lower extremity aneurysms.   HISTORY:  Diabetes:  No.  Cardiac:  CABG.  Hypertension:  Yes.  Smoking:  No.  Previous Surgery:  Aorta right fem graft, aorta left iliac graft.   SINGLE LEVEL ARTERIAL EXAM                          RIGHT                LEFT  Brachial:               142                  143  Anterior tibial:        154                  163  Posterior tibial:       157                  172  Peroneal:  Ankle/Brachial Index:   1.1                  1.2   LOWER EXTREMITY ARTERIAL DUPLEX EXAM   PRIOR EXAM:  Date:  12/18/2009  Right:  1.15  Left:  1.16   DUPLEX:  1. Patent bilateral EIA, CFA, and popliteal arteries.  2. Please see attached worksheet for all measurements.   IMPRESSION:  1. Stable ankle brachial indices from previous examination.  2. 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  3. Right lower extremity aneurysms appear stable.        ___________________________________________  Larina Earthly, M.D.   LT/MEDQ  D:  06/16/2010  T:  06/16/2010  Job:  [id number]

## 2010-12-07 NOTE — Procedures (Signed)
VASCULAR LAB EXAM   INDICATION:  Followup peripheral artery aneurysms.   HISTORY:  AAA repair, right aortofem, left aortoiliac bypass graft 1995 by Dr.  Madilyn Fireman.  Diabetes:  No.  Cardiac:  CABG, MI.  Hypertension:  Yes.   EXAM:  Bilateral EIA, CFA and popliteal artery duplex.   See attached work sheet for all measurements.   IMPRESSION:  1. Right femoral artery measurements show slight increase from      previous study.  2. Left distal EIA/CFA shows significant increase from previous study.  3. Bilateral popliteal artery measurements appear stable from previous      study.   ___________________________________________  Larina Earthly, M.D.   AS/MEDQ  D:  04/24/2009  T:  04/25/2009  Job:  [id number]

## 2010-12-07 NOTE — Assessment & Plan Note (Signed)
[facility]                               LIPID CLINIC NOTE   RAMIREZ, FULLBRIGHT                         MRN:          [id number]  DATE:06/26/2008                            DOB:          [birth year]    Jesse [name] comes in today for followup of his hyperlipidemia therapy,  which includes Crestor 20 mg daily and fenofibrate 200 mg daily.  He has  been compliant with both of these therapies and tolerating them just  fine with no muscle aches or pains to report.   OTHER MEDICATIONS:  1. Aspirin 81 mg.  2. Zinc 50 mg.  3. Claritin 10 mg.  4. Diovan/HCTZ.  5. Coreg.   PHYSICAL EXAMINATION:  VITAL SIGNS:  His weight is 202 pounds and blood  pressure is 115/70.   LABORATORY DATA:  Total cholesterol 128, triglycerides 100, HDL 33.5,  and LDL 75.  LFTs are within normal limits.   ASSESSMENT:  Jesse [name] numbers are slightly better and his  triglycerides remain at goal.  HDL is slightly better, but not yet at  the goal of greater than 45.  His LDL has improved and is closer to the  goal of less than 70.  He has been continuing with the same diet, but  admits to not exercising still.  Of note, he recently received a  diagnosis of prostate cancer and will receive treatment for that in the  next few weeks.  His affect is slightly down due to this news.   PLAN:  We are going to continue with the same medications for now and  hope that he has improved those numbers, continue to improve, I do not  really want to make any changes to his medications at this time in a  high-stress situation, getting ready to undergo treatment for prostate  cancer.  I asked him to continue exercising as much as he can tolerate  and I wished him not to quit his treatment and we made an appointment  with him to follow up with [place] in 6  months to repeat the liver and lipid panel.  He was instructed to call  us with any questions or concerns about his hyperlipidemia therapy in  the meantime.     Charolotte Eke, PharmD  Electronically Signed      Rollene Rotunda, MD, [facility]  Electronically Signed   TP/MedQ  DD: 06/26/2008  DT: 06/27/2008  Job #: [id number]

## 2010-12-07 NOTE — Assessment & Plan Note (Signed)
[facility]                               LIPID CLINIC NOTE   Jesse [name], Jesse [name]                         MRN:          [id number]  DATE:04/26/2007                            DOB:          04/11/1928    PAST MEDICAL HISTORY:  1. Hyperlipidemia.  2. Coronary artery disease status post coronary artery bypass graft.  3. Ischemic cardiomyopathy with ejection fraction of 55%.  4. Hypertension.   MEDICATIONS:  1. Aspirin 81 mg daily.  2. Zinc 50 mg daily.  3. Claritin 10 mg daily.  4. Lipitor 80 mg daily.  5. Diovan HCTZ 80/12.5 mg daily.  6. Fenofibrate 200 mg daily.  7. Coreg 12.5 mg twice daily.   Weight 208 pounds, blood pressure 60/90, heart rate 70.   LAB DATA:  Total cholesterol 143, triglycerides 115, HDL 24, LDL 96,  LFTs within normal limits.   ASSESSMENT:  Jesse [name] is a pleasant 77 year old gentleman who returns  to lipid clinic today with no chest pain, no shortness of breath, no  muscle aches and pains.  He is compliant with current medication.  His  total cholesterol is at goal of less than 200.  Triglycerides at goal of  less than 150.  HDL less than goal greater than 40 and significantly  decreased from last visit.  LDL is greater than goal of less than 70 and  significantly elevated from last visit.  Jesse [name] is compliant with a  heart healthy diet, low fat, low cholesterol diet, low carbohydrates;  however, he is noncompliant with his exercise regimen, always having  several excuses.  He does walk or ride a bike 30-45 minutes twice a  week; however, every time we have discussed increasing throughout, he  states that he does for a short period of time, and then he has an  excuse of why he has slacked off.  Over the recent several weeks, he has  not been exercising very vigilantly.  He has been watching the stock  market changes on TV, and he is very upset over the recent issues  surrounding the [place] economy.  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  We had a lengthy discussion regarding the benefit  of continuing to exercise and that he cannot change the market and  therefore can take 30-45 minutes daily to exercise, decrease his stress,  and will ultimately help him in the long run.  He is willing, so he  states, to follow this advice and does understand if he does not  increase his exercise and bring his lipid panel within range by next  visit that we will be adding new medications, Niaspan being 1 option.   PLAN:  1. Continue current medications at this time.  2. Increase exercise regimen.  3. Continue heart healthy diet.  4. Followup visit in 6 months for lipid panel and LFTs, and I will      make adjustments at that time.      Leota Sauers, PharmD  Electronically Signed      Jesse Sans. Daleen Squibb, MD, [facility]  Electronically Signed   LC/MedQ  DD: 04/26/2007  DT: 04/27/2007  Job #: [id number]

## 2010-12-07 NOTE — Assessment & Plan Note (Signed)
[facility]                               LIPID CLINIC NOTE   GARV, KUECHLE                         MRN:          [id number]  DATE:01/03/2008                            DOB:          Apr 09, 1928    Return office visit for lipid clinic.   PAST MEDICAL HISTORY:  1. Hyperlipidemia.  2. Coronary artery disease, status post acute myocardial infarction      and coronary artery bypass graft in 1992.  3. Peripheral arterial disease.  4. Abdominal aortic aneurysm repair by Dr. Madilyn Fireman.   MEDICATIONS:  1. Aspirin 81 mg daily.  2. Zinc 50 mg daily.  3. Claritin 10 mg daily.  4. Diovan 80 mg daily.  5. HCTZ 12.5 mg daily.  6. Crestor 20 mg daily.  7. Coreg 12.5 mg twice daily.   Vital signs; weight 206 pounds, blood pressure 132/70 and heart rate 70.   LABORATORY DATA:  Total cholesterol 130, triglycerides 105, HDL 25, LDL  84.  LFTs within normal limits.   ASSESSMENT:  Mr. Cajuste is a very pleasant 77 year old man who has no  chest pain, no shortness of breath, no muscle aches or pains.  He states  that he feels like he is in fine health and doing pretty well given his  past medical history.  He states compliance with his exercise regimen  and says how he loves to do it, but upon further questioning of what  exactly he does, it sounds like he rides his bike once a week and  occasionally, maybe two or three other times that week, he may  potentially walk about 20 to 30 minutes.  He has a whole flew of excuses  of why he does not do his exercises, weather it be from allergies and  itching eyes to runny nose to raining or too hot, too cold, etc.  I have  encouraged him and suggested several places indoors where he could walk,  but he says he prefers outside, but then complains of weather related  problems.  I have continued to encourage Mr. Heyward to continue his  exercise regimen a minimum of 3 days a week with the hopes of bringing  his LDL to goal  of less than 70.  He is very against increasing the  Crestor to 40 mg, nor does he want to add Zetia 10 mg to his medication  regimen.  Mr. Haynesworth does understand that with an LDL of greater than  70, it does increase his risk of another myocardial infarction, stroke  or death.  He says that he is sure that he can change his diet of eating  foods that are not as good for him, although he does like to snack on  his sweets, and exercise and bring down his lipid panel to goal.  He is  agreeable to give him the next 6 months to make lifestyle changes and at  that time if his LDL still remains to be above 70, that we will have to  increase his Crestor or add Zetia.  He  seems to be very leery of this,  and hopefully he will make his lifestyle modifications because this  would be healthier for him.   PLAN:  1. Continue current medication regimen.  2. Increase exercise.  3. Decrease fats in diet.  4. Follow-up visit in 6 months for lipid panel and LFTs and make      adjustments at that time.      Leota Sauers, PharmD  Electronically Signed      Jesse Sans. Daleen Squibb, MD, [facility]  Electronically Signed   LC/MedQ  DD: 01/03/2008  DT: 01/03/2008  Job #: [id number]

## 2010-12-07 NOTE — Assessment & Plan Note (Signed)
OFFICE VISIT   Jesse [name], Jesse [name]  DOB:  1928/02/04                                       04/17/2008  CHART#:[id number]   The patient returns for yearly followup of known popliteal and femoral  aneurysm.   No complaints of leg pain.   BP is 115/75, pulse is 78 per minute.  He is alert and oriented and in  no distress.  Femoral and popliteal pulses are intact bilaterally.  2+  posterior tibial and dorsalis pedis bilaterally.  No ankle edema.   Duplex evaluation of the femoral and popliteal arteries bilaterally was  carried out today.  Fairly stable size of peripheral aneurysms.  Right  external iliac measures 3.4 cm, right common femoral 2.2 and right  popliteal 1.7.  Left external iliac 2.6.  Left common femoral 2.8 and  left popliteal 2.5 cm.  These are reasonably stable.   Continue to follow on a yearly basis with surveillance ultrasound.   Balinda Quails, M.D.  Electronically Signed   PGH/MEDQ  D:  04/17/2008  T:  04/19/2008  Job:  [id number]

## 2010-12-07 NOTE — Assessment & Plan Note (Signed)
OFFICE VISIT   DARRIC, PLANTE  DOB:  1927-12-31                                       04/12/2007  CHART#:[id number]   Patient returns today for continued followup of his popliteal and  femoral aneurysms.   He underwent duplex scan revealing his femoral arteries to be 2.1 cm  bilaterally.  The popliteal arteries reveal 1.7 and 2.4 in the right and  left, respectively.  Mild enlargement of his popliteal arteries noted.  Femoral arteries are stable.   BP is 122/72.  Pulse is 74 per minute.  Intact femoral pulses  bilaterally.  Popliteals are full.  Dorsalis pedis and posterior tibial  are 2+ bilaterally.  No ankle edema.   Patient continues to have asymptomatic ectasia with small aneurysm  formation in his lower extremities at the femorals and popliteals.  We  will plan to continue followup on a yearly basis.   Balinda Quails, M.D.  Electronically Signed   PGH/MEDQ  D:  04/12/2007  T:  04/13/2007  Job:  [id number]

## 2010-12-07 NOTE — Procedures (Signed)
BYPASS GRAFT EVALUATION   INDICATION:  Follow up aorto to right femoral/left iliac bypass graft.   HISTORY:  Diabetes:  No.  Cardiac:  CABG, MI.  Hypertension:  Yes.  Smoking:  Previous.  Previous Surgery:  AAA repair and aorta to right femoral/left iliac  bypass graft in 1995 by Dr. Madilyn Fireman.   SINGLE LEVEL ARTERIAL EXAM                               RIGHT              LEFT  Brachial:                    135                136  Anterior tibial:             143                157  Posterior tibial:            156                158  Peroneal:  Ankle/brachial index:        1.15               1.16   PREVIOUS ABI:  Date: 04/24/09  RIGHT:  1.26  LEFT:  1.27   LOWER EXTREMITY BYPASS GRAFT DUPLEX EXAM:   DUPLEX:  Patent aorta, right femoral/left iliac bypass graft with no  evidence of focal stenosis.  Bilateral external iliac artery, common  femoral artery, and popliteal aneurysms appear stable.   IMPRESSION:  1. Stable ankle brachial indices bilaterally.  2. Patent aorta to right femoral/left iliac bypass graft with no focal      stenosis.  3. Stable aneurysms in bilateral legs.         ___________________________________________  Larina Earthly, M.D.   CB/MEDQ  D:  12/18/2009  T:  12/18/2009  Job:  [id number]

## 2010-12-07 NOTE — Procedures (Signed)
VASCULAR LAB EXAM   INDICATION:  Follow up bilateral popliteal and common femoral aneurysm.   HISTORY:  Diabetes:  No  Cardiac:  Yes  Hypertension:  Yes   EXAM:  Duplex of bilateral popliteal artery and common femoral artery.   IMPRESSION:  Stable bilateral external iliac and popliteal aneurysms.   ___________________________________________  P. Liliane Bade, M.D.   MG/MEDQ  D:  04/12/2007  T:  04/12/2007  Job:  [id number]

## 2010-12-07 NOTE — Assessment & Plan Note (Signed)
OFFICE VISIT   DALLYN, BERGLAND  DOB:  02-Jul-1928                                       12/18/2009  CHART#:[id number]   The patient presents today for continued followup of his old aneurysm  repair and also followup of femoral and popliteal artery aneurysms.  He  is an established patient of Dr. Liliane Bade and understands that Dr.  Madilyn Fireman has left our practice.  He had undergone a prior aneurysm repair  in January of 2009 with an aorta to right femoral and left iliac artery.  He subsequently had repair of a large ventral incisional hernia which  has recurred.  Most recently he has had difficulty with prostate cancer  having undergone radiation therapy and was having some difficulty with  rectal irradiation injury.  He has no symptoms referable to his hernia  and has been seen in followup.  He has known peripheral aneurysms as  well.   PAST HISTORY:  Significant for coronary artery bypass grafting,  myocardial infarction.  He is not a diabetic.  He does have history of  hypertension and does not smoke currently.   PHYSICAL EXAM:  A well-developed, well-nourished white male appearing  stated age of 44.  Blood pressure is 148/91, pulse 56, respirations 18.  He is in no acute stress.  HEENT is normal.  Neck is without adenopathy.  Chest is clear bilaterally.  Heart regular rate and rhythm.  His radial  pulses are 2+ bilaterally.  He has prominent femoral, prominent  popliteal and prominent posterior tibial pulses bilaterally.  Abdominal  exam reveals a very large ventral incisional hernia.  Musculoskeletal  shows no major deformities, cyanosis.  Neurologic, no focal weakness or  paresthesias.  Skin without ulcers or rashes.   He did undergo repeat ankle arm indices today revealing normal ankle arm  index bilaterally.  He had imaging of his popliteal artery showing no  significant change in his small to moderate popliteal artery aneurysms.  I again discussed this  at length with the patient and explained that he  is at some risk for progressive enlargement of these and would recommend  that we continue to watch them in our noninvasive vascular lab.  I agree  with Dr. Florina Ou plan due to his advanced age and diffuse peripheral  aneurysms that we would recommend observation unless they become large  or symptomatic.  I discussed symptoms of acute ischemia with the patient  and he knows to present immediately to the emergency room should this  occur.     Larina Earthly, M.D.  Electronically Signed   TFE/MEDQ  D:  12/18/2009  T:  12/18/2009  Job:  [id number]   cc:   Florene Glen  Veverly Fells. Excell Seltzer, MD

## 2010-12-07 NOTE — Assessment & Plan Note (Signed)
[facility]                            CARDIOLOGY OFFICE NOTE   Jesse [name], Jesse [name]                         MRN:          [id number]  DATE:01/03/2007                            DOB:          09-Jan-1928    Jesse [name] is a very pleasant, 77 year old, white, married male, retired  Psychologist, occupational with a long history of coronary artery disease, hyperlipidemia,  hypertension, and ischemic cardiomyopathy. The patient had CABG in 1992  by Dr. Tyrone Sage. He subsequently had AAA resection and grafting by Dr.  Madilyn Fireman who continues to follow him concerning this. The patient had  severe ischemic cardiomyopathy in the past and was considered for an ICD  but decided not to proceed. Most recent echo, April 26, 2006, actually  revealed an EF of 55% with slight LV dysfunction.   The patient is feeling well with no cardiac symptoms. He does note some  increased fatigue at times.   The patient has been on Coreg 12.5 b.i.d. but does not desire to  increase it.   He is also on fenofibrate 200 daily, Diovan HCT 80/12.5, Lipitor 80,  Claritin, Zinc and aspirin 81.   PHYSICAL EXAMINATION:  VITAL SIGNS:  Blood pressure 115/70, pulse 60,  normal sinus rhythm.  GENERAL:  Normal.  NECK:  JVP is not elevated. Carotid pulses palpably equal without  bruits.  LUNGS:  Clear.  CARDIAC:  Reveals no murmur or gallop.  ABDOMEN:  Reveals a large hernia otherwise unremarkable.  EXTREMITIES:  Reveal no edema.   EKG reveals normal sinus rhythm with left anterior hemiblock, there are  some PACs with aberrant conduction.   </DIAGNOSES/M  As noted above. The patient is quite stable. It certainly appears that  his ejection fraction has improved. We plan to obtain a BMP today and  see Dr. Excell Seltzer in 4 months. He will need followup carotids in 6 or 8  months per Dr. Excell Seltzer.     Cecil Cranker, MD, [facility]  Electronically Signed    EJL/MedQ  DD: 01/03/2007  DT: 01/03/2007  Job #: [id number]

## 2010-12-07 NOTE — Assessment & Plan Note (Signed)
[name]                            CARDIOLOGY OFFICE NOTE   Jesse [name], Jesse [name]                         MRN:          [id number]  DATE:12/28/2007                            DOB:          Sep 25, 1927    Jesse [name] was seen in follow-up at the [facility] on  December 28 2007.  Jesse [name] is a 77 year old gentleman with coronary artery  disease status post CABG in 1992 and status post AAA repair.  He has an  ischemic cardiomyopathy that has improved with medical therapy and  revascularization.   He continues to do well from a symptomatic standpoint.  He engages in  regular walking for exercise and has no exertional symptoms.  He  specifically denies chest pain or pressure.  He has no exertional  dyspnea.  He denies edema or palpitations.  He continues to follow with  our lipid clinic for treatment of his cholesterol and he follows yearly  with Dr. Madilyn Fireman for surveillance after AAA repair.   CURRENT MEDICATIONS:  1. Aspirin 81 mg daily.  2. Zinc 50 mg daily.  3. Claritin 10 mg daily.  4. Diovan HCT 80/12.5 mg daily.  5. Fenofibrate 200 mg daily.  6. Crestor 20 mg daily.  7. Coreg 12.5 mg twice daily.   ALLERGIES:  NKDA.   EXAM:  The patient is alert and oriented, he is in no acute distress.  Weight is 206, blood pressure is 124/80, heart rate 62, respiratory rate  is 12.  HEENT:  Normal.  NECK:  Normal carotid upstrokes without bruits, jugular venous pressure  is normal.  LUNGS:  Clear to auscultation bilaterally.  HEART:  Regular rate and rhythm without murmurs or gallops.  ABDOMEN:  Soft, nontender.  There is a midline abdominal hernia that is  easily reducible.  EXTREMITIES:  No clubbing, cyanosis or edema.  Posterior tibial pulses  are 2+ and equal.   EKG shows sinus rhythm with an age indeterminate inferior MI and a left  bundle branch block.   ASSESSMENT:  1. Coronary artery disease status post coronary artery bypass  grafting.  Jesse [name] is doing well with medical therapy.  He has      no angina.  Continue current regimen which includes aspirin, statin      and antihypertensive therapy with beta blocker and angiotensin      reception blocker  2. Dyslipidemia, he has a follow-up appointment in our lipid clinic      next week.  He continues on dual therapy with Crestor and      fenofibrate.  3. Hypertension, he remains on combination of Coreg and Diovan HCT.      He brought in home blood pressure readings which were excellent.      There were a few outliers but over 90% of his blood pressures were      in the range of 110 to the low 120s systolic over 70s diastolic.   For follow-up I would like to see Jesse [name] back in 6 months.     Veverly Fells. Excell Seltzer, MD  Electronically Signed    MDC/MedQ  DD: 12/28/2007  DT: 12/28/2007  Job #: [id number]   cc:   Vikki Ports, M.D.

## 2010-12-07 NOTE — Assessment & Plan Note (Signed)
[facility]                               LIPID CLINIC NOTE   CARMINE, CARROZZA                         MRN:          [id number]  DATE:10/11/2007                            DOB:          March 13, 1928    PAST MEDICAL HISTORY:  1. Hyperlipidemia.  2. Coronary artery disease, status post coronary artery bypass graft      in 1992.  3. Peripheral arterial disease.  4. Abdominal aortic aneurysm repair by Dr. Madilyn Fireman.   MEDICATIONS:  1. Aspirin 81 mg daily.  2. Zinc 50 mg daily.  3. Claritin 10 mg daily.  4. Lipitor 80 mg daily.  5. Diovan 80 mg daily.  6. HCTZ 12.5 mg daily.  7. Tricor 200 mg daily.  8. Coreg 12.5 mg twice daily.   PHYSICAL EXAMINATION:  VITAL SIGNS:  Weight 207 pounds, blood pressure  130/78, heart rate 80.   LABORATORY DATA:  Total cholesterol 140, triglyceride 116, HDL 25, LDL  92.  LFTs within normal limits.   ASSESSMENT:  [name] is a very pleasant, 77 year old gentleman who  returns to lipid clinic today with no chest pain, no shortness of  breath, no muscle aches or pains.  He is compliant with current  medication regimen, although he does state that the Lipitor is a very  large pill and hard to follow and would like to be changed to a  different medication.  He has in the past tolerated Crestor 20 mg with  no problem.  I am unsure why several years ago he was switched from  Crestor 20 to Lipitor 80.  Therefore, we will change him back to Crestor  20 mg tablets 1 tablet daily.  Prescription was given.  Mr. Broyhill  total cholesterol is at goal of less than 200, triglycerides at goal  less of 150, LDL is greater than goal of less than 70 and HDL is than  goal of greater than 40.  This has remained the same since last visit.  At last visit, we had a lengthy discussion regarding diet and exercise.  He swore that he would restart his exercise program which he was riding  his bike or walking several days a week and he has yet  to do this.  He  says, oh yeah, I love to exercise and I do it all the time, but then  once asking him how many times in the last week he did so, he says he  has exercised once in the last 2 weeks.  He blames a lot of his excuses  on the weather whether it has been too cold or too hot, it has been  snowing, it has been rainy, etc.  We have encouraged him to walk in his  house, to use the steps and do those several times a day.  On days the  weather is not permissible to exercise, he is willing to go to the [employer]  and to lift some weights and to ride his bike on days that he is able  to.  As far as  his diet, it sounds like his wife cooks very healthy, low-  fat meals for him.  When out, he seems to choose low-fat, nonfried  foods, however, he does snack on 5-6 chocolate chip cookies a day as  well as a pack of nabs or peanut butter crackers or pimento cheese  crackers.  I have encouraged him to snack on other healthier foods like  fruits and vegetables, however, he has turned his nose up at these.  It  seems that although Mr. Westmoreland wants to have a healthier lifestyle, that  he is not willing to make very many changes in his current activities of  daily living.  I have encouraged him and have also told him that I will  continue to encourage on all followup visits, although I am unsure if  these will actually make any change in his exercise or diet.   PLAN:  1. Stop Lipitor 80 mg.  2. Begin Crestor 20 mg daily.  3. Followup visit in 3 months for lipid panel and LFTs.  4. Increase exercise and decrease snacks in diet.      Leota Sauers, PharmD  Electronically Signed      Jesse Sans. Daleen Squibb, MD, [facility]  Electronically Signed   LC/MedQ  DD: 10/11/2007  DT: 10/12/2007  Job #: [id number]

## 2010-12-07 NOTE — Procedures (Signed)
LOWER EXTREMITY ARTERIAL EVALUATION-SINGLE LEVEL   INDICATION:  Followup evaluation of popliteal and external iliac artery  aneurysms.   HISTORY:  Diabetes:  No.  Cardiac:  Coronary artery bypass graft, MI.  Hypertension:  Yes.  Smoking:  No.  Previous Surgery:  Abdominal aortic aneurysm repair.   RESTING SYSTOLIC PRESSURES: (ABI)                          RIGHT                LEFT  Brachial:               122                  126  Anterior tibial:        133                  144  Posterior tibial:       144 (> 1.0)          158 (> 1.0)  Peroneal:  DOPPLER WAVEFORM ANALYSIS:  Anterior tibial:        Triphasic            Triphasic  Posterior tibial:       Triphasic            Triphasic  Peroneal:   PREVIOUS ABI'S:  Date:  RIGHT:  LEFT:   IMPRESSION:  ABI suggests no significant arterial occlusive disease in  the lower extremities bilaterally.   ___________________________________________  P. Liliane Bade, M.D.   MC/MEDQ  D:  04/17/2008  T:  04/18/2008  Job:  [id number]

## 2010-12-07 NOTE — Assessment & Plan Note (Signed)
[facility]                            CARDIOLOGY OFFICE NOTE   STEADMAN, PROSPERI                         MRN:          [id number]  DATE:05/01/2007                            DOB:          1927/12/04    SUBJECTIVE:  Chord Marsolek was seen in followup at the [facility]iology  Office on May 01, 2007.  He is a very nice 77 year old gentleman who  will turn 68 tomorrow.  He is a retired Psychologist, occupational with a long history of  coronary artery disease and previous bypass surgery back in 1992 by Dr.  Tyrone Sage.  He also has a history of peripheral arterial disease and AAA  repair by Dr. Madilyn Fireman.  He has had an ischemic cardiomyopathy that has  recovered with medical therapy and revascularization.   From a symptomatic standpoint, Mr. Stern is doing quite well.  He tells  me that he has good days and bad days regarding his energy level.  Some  days he feels tired and fatigued but most of the time he feels good.  He  relates some of his symptoms to the air quality.  He [place]  He has not had any chest  pain, dyspnea, edema, light headedness, syncope or palpitations.   CURRENT MEDICATIONS:  1. Aspirin 81 mg daily.  2. Zinc 50 mg daily.  3. Claritin 10 mg daily.  4. Lipitor 80 mg daily.  5. Diovan/HCT 80/12.5 mg daily.  6. Fenofibrate 200 mg daily.  7. Coreg 12.5 mg twice daily.   ALLERGIES:  No known drug allergies.   PHYSICAL EXAMINATION:  GENERAL APPEARANCE:  The patient is alert and  oriented.  He is in no acute distress.  VITAL SIGNS:  Weight 208.  Blood pressure 129/70.  Heart rate 65.  Respiratory rate 12.  HEENT:  Normal.  NECK:  Normal carotid upstrokes without bruits.  Jugular venous pressure  is normal.  LUNGS:  Clear to auscultation bilaterally.  HEART:  Regular rate and rhythm without murmurs or gallops.  ABDOMEN:  Soft, nontender, no organomegaly.  No abdominal bruits.  EXTREMITIES:  No  cyanosis, clubbing or edema.  Peripheral pulses are  intact and equal.   CLINICAL DATA:  Electrocardiogram shows normal sinus rhythm with an  inferior MI, age indeterminate and left axis deviation.  There is a  nonspecific T wave abnormality present.   LABORATORY DATA:  Lipids from April 20, 2007 show a total  cholesterol of 143, triglycerides 115, HDL 25, LDL 96.   ASSESSMENT:  1. Coronary artery disease status post remote coronary artery bypass      grafting.  Continue with aggressive secondary risk reduction and      with antiplatelet therapy with aspirin, Statin therapy with Lipitor      and antihypertensive therapy with combination of Coreg, Diovan and      HCT.  I encouraged Mr. Devino to try to increase his exercise in      order to improve his overall sense of well-being and overall energy  level.  2. Hyperlipidemia.  He is followed in the lipid clinic.  His lipids      are as detailed above.  He continues on Fenofibrate and Lipitor.  3. Hypertension.  Blood pressure is under optimal control with      combination of Diovan/HCT and Coreg.  4. Peripheral arterial disease and prior abdominal aortic aneurysm      repair.  He continues to be regularly followed by Dr. Madilyn Fireman.  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   FOLLOWUP:  I would like to see Mr. Weilbacher back in six months or sooner  if any new problems arise.     Veverly Fells. Excell Seltzer, MD  Electronically Signed    MDC/MedQ  DD: 05/01/2007  DT: 05/02/2007  Job #: [id number]

## 2010-12-07 NOTE — Assessment & Plan Note (Signed)
[facility]                            CARDIOLOGY OFFICE NOTE   Jesse [name], Jesse [name]                         MRN:          [id number]  DATE:07/07/2008                            DOB:          03-28-1928    REASON FOR VISIT:  Followup CAD and status post CABG.   HISTORY OF PRESENT ILLNESS:  Jesse [name] is a 77 year old gentleman with  coronary artery disease, status post CABG in 1992 as well as previous  AAA repair.  He had a followup cardiac cath in 1999, that showed a  patent LIMA as well as patent vein grafts to the diagonal OM and  sequence graft to the PDA and posterolateral branches of the right  coronary artery.  His last Myoview scan was in 2007, that showed a gated  LVEF of 44%.  There was a fixed defect in the inferolateral wall, but no  ischemia.   Jesse [name] has been diagnosed with prostate cancer.  He has had a  difficult time dealing with this and has been struggling with depression  over the last month.  He has an upcoming seed implant and planned course  of prostate radiation therapy.  He denies any cardiac complaints.  He  specifically denies chest pain or dyspnea.   MEDICATIONS:  1. Aspirin 81 mg daily.  2. Claritin 10 mg daily.  3. Diovan/slash HCTZ 80/12.5 mg daily.  4. Fenofibrate 200 mg daily.  5. Crestor 20 mg daily.  6. Coreg 12.5 mg twice daily.   ALLERGIES:  NKDA.   PHYSICAL EXAMINATION:  GENERAL:  The patient is alert and oriented.  He  is in no acute distress.  VITAL SIGNS:  Weight is 199 pounds, blood pressure 132/90, heart rate  73, and respiratory rate 12.  HEENT:  Normal.  NECK:  Normal carotid upstrokes.  No bruits.  JVP normal.  LUNGS:  Clear bilaterally.  HEART:  Regular rate and rhythm.  No murmurs or gallops.  ABDOMEN:  Soft and nontender with a midline umbilical hernia easily  reducible without tenderness.  EXTREMITIES:  No clubbing, cyanosis, or edema.  Peripheral pulses are  intact and equal.   EKG  shows normal sinus rhythm with a left bundle branch block.   ASSESSMENT:  1. Coronary artery disease status post coronary artery bypass graft.      No angina at present.  Continue current medical program, which      includes aspirin, statin, beta-blocker, and angiotensin receptor      blocker.  2. Hypertension, blood pressure remains under good control on his      current medical regimen.  3. Status post abdominal aortic aneurysm repair.  The patient      continues close followup with Dr. Madilyn Fireman.  4. Dyslipidemia.  He is followed in our Lipid Clinic.  He remains on      TriCor and Crestor.  Lipids at last check showed cholesterol 128,      triglycerides 100, HDL 33, and LDL 75.  He was encouraged to resume      his exercise program.  Overall, I think, Jesse [name] is doing well.  We spent a long time  talking about his recent diagnosis of prostate cancer.  I would expect  his prognosis to be fairly good after based on the information that has  been given by Dr. Vernie Ammons.  I would like to see him back in 6 months for  cardiac followup.  I would be happy to see him sooner if any problems  arise.     Veverly Fells. Excell Seltzer, MD  Electronically Signed    MDC/MedQ  DD: 07/07/2008  DT: 07/08/2008  Job #: [id number]   cc:   Balinda Quails, M.D.  Veverly Fells. Vernie Ammons, M.D.  Billie Lade, M.D.  Vikki Ports, M.D.

## 2010-12-07 NOTE — Procedures (Signed)
VASCULAR LAB EXAM   INDICATION:  Followup evaluation of external iliac and popliteal artery  aneurysm.   HISTORY:  Diabetes:  No.  Cardiac:  Coronary artery bypass graft, MI.  Hypertension:  Yes.   EXAM:  Previous duplex on 04/12/2007 revealed right external iliac  measured 3.4 cm x 3.2 cm, right common femoral artery measured 1.8 x 2.2  cm, right popliteal artery measured 1.5 x 1.7 cm, left external iliac  artery measured 2.4 cm x 2.8 cm, left common femoral artery measured 1.6  x 2.1 cm, left popliteal artery measured 2.5 x 2.3 cm.   IMPRESSION:  1. Today right external iliac 3.5 cm x 3.6 cm, left external iliac 2.4      cm x 2.6 cm, right common femoral 1.6 cm x 1.9 cm, left common      femoral 2.5 cm x 2.8 cm.  Popliteal right 1.6 cm x 1.7 cm, left 2.4      cm x 2.5.  2. External iliac, common femoral and popliteal artery aneurysm      measurements are stable compared to previous study.  3. The left common femoral artery is slightly larger than previously      recorded.   ___________________________________________  P. Liliane Bade, M.D.   MC/MEDQ  D:  04/17/2008  T:  04/18/2008  Job:  [id number]

## 2010-12-10 NOTE — Consult Note (Signed)
[place]  Patient:    Jesse [name], Jesse [name] Visit Number: [id number] MRN: [id number]          Service Type: SUR Location: [id number] Attending Physician:  Ivor Messier Dictated by:   Nathen May, M.D., [facility] Proc. Date: 08/04/01 Admit Date:  08/04/2001 Discharge Date: 08/05/2001   CC:         Jesse [name], M.D. [facility]                          Consultation Report  REASON FOR CONSULTATION:  Jesse [name] is seen at the request of Dr. Cecilie Kicks because of anticipated surgery for a left retinal detachment.  HISTORY OF PRESENT ILLNESS:  Jesse [name] has ischemic heart disease.  He is status post bypass surgery in 1992 with a catheterization in 1999 demonstrating patent grafts and a negative Cardiolite in February of this last year that was apparently okay, although the report from February 2000 reportedly demonstrated mild apical ischemia.  There was diffuse hypokinesis with an estimated ejection fraction of 29%.  I saw him at some point in the last year regarding primary prevention therapy for sudden cardiac death given his ischemic heart disease and depressed left ventricular function.  He elected not to undertake this, as his functional capacity was really quite good, probably class I.  He also has peripheral vascular disease, status post AAA repair, and has had some aneurysms followed by Dr. Madilyn Fireman.  He has had no problems with chest pain or shortness of breath of late.  He saw Dr. _____ a couple of weeks ago and at that point the assessment was that he was stable.  MEDICATIONS:  His medications at present include Toprol 25 mg, hydrochlorothiazide 12.5 mg, Pravachol 40 mg, aspirin, and an unknown cholesterol medication.  ALLERGIES:  No known drug allergies.  PAST MEDICAL HISTORY:  As noted above.  In addition, he has hypertension, nephrolithiasis, irritable bowel, and asymptomatic cholelithiasis.  PAST SURGICAL HISTORY:   TURP, a ventral hernia repair, a AAA repair as noted.  SOCIAL HISTORY:  He is married.  He is involved with his children and grandchildren.  He does not use cigarettes, alcohol, or recreational drugs.  PHYSICAL EXAMINATION:  VITAL SIGNS:  His blood pressure is 153/90, his pulse is 57, respirations are 16 and unlabored.  GENERAL:  He is an elderly Caucasian male in no acute distress.  HEENT:  No scleral icterus or xanthomata.  His eyes were not examined.  NECK:  His neck veins were flat.  His carotids were brisk bilaterally without bruits.  BACK:  Without kyphosis or scoliosis.  CHEST:  His lungs were clear.  CARDIAC:  Heart sounds were regular with a diminished S1 and normal S2.  There was a well-healed midsternotomy scar.  ABDOMEN:  Soft with active bowel sounds without midline pulsation or hepatomegaly.  EXTREMITIES:  His femoral pulses were 2+.  Distal pulses were intact.  There was no clubbing, cyanosis, or edema.  NEUROLOGIC:  Grossly normal.  DIAGNOSTIC STUDIES:  Electrocardiogram demonstrated no acute changes.  IMPRESSION: 1. Left retinal detachment. 2. Ischemic heart disease.    a. Status post coronary artery bypass graft.    b. Apparently not negative Cardiolite last spring.    c. No intercurrent change in symptoms.    d. Ejection fraction of 29% with prior referral for implantable       cardioverter-defibrillator implantation for primary prevention, which  the patient declined. 3. Hypertension. 4. Diabetes. 5. Dyslipidemia.  [name] has ischemic heart disease with depressed left ventricular function.  His functional capacity is excellent.  His risk should be acceptable for surgery.  Please let us known, and we will follow afterward. Dictated by:   Nathen May, M.D., [facility] Attending Physician:  Ivor Messier DD:  08/04/01 TD:  08/06/01 Job: [id number]

## 2010-12-10 NOTE — Assessment & Plan Note (Signed)
[facility]                              CARDIOLOGY OFFICE NOTE   NAME:Jesse [name], Jesse [name]                         MRN:          [id number]  DATE:04/26/2006                            DOB:          1928/05/06    A very pleasant nearly 77 year old white married male with a long history of  coronary artery disease, hyperlipidemia, hypertension and ischemic  cardiomyopathy.  He has also had an AAA resection and grafting per Dr.  Madilyn Fireman.  He is followed by Dr. Madilyn Fireman with yearly ultrasound.  A CABG was  performed in 1984.  The patient is having no cardiac symptoms, generally  feels well.  His EF had been as low as 30% by echo in 2002 and more recent  35-45%; however, today it is stated to be 55% with mild decrease in LV  systolic function.  The patient is having no cardiac symptoms.  We recently  started him on Coreg CR 20.   MEDICATIONS:  1. Aspirin 81.  2. Zinc.  3. Claritin.  4. Lipitor 80.  5. Diovan/HCTZ 80/12.5.  6. Fenofibrate.  7. Coreg CR 20.   Blood pressure 118/83, pulse 57, normal sinus rhythm.  GENERAL APPEARANCE:  Normal.  JVP is not elevated, carotid pulse bounding  without bruits.  LUNGS:  Clear.  CARDIAC EXAM:  Normal.  ABDOMINAL EXAM:  Unremarkable.  EXTREMITIES:  Normal.   EKG reveals left bundle branch block unchanged.   DIAGNOSIS:  As above.   I am certainly pleased that his ejection fraction appears to be improved.  The patient is asymptomatic.   We will call Dr. Madilyn Fireman for a copy of the recent ultrasound.  We plan to  increase his Coreg but will give it to him in the form of 25 twice a day.  He is to check his blood pressure closely, have  his blood pressure checked again when he returns for BNP, LFT and lipids and  I will see him back in 6 weeks or p.r.n.          ADDENDUM:  Because of the sinus bradycardia and intraventricular conduction  delay, and blood pressure 118, we plan to increase the Coreg to 18.5 twice  a  day, rather than the 25 twice a day.              ______________________________  E. Graceann Congress, MD, [facility]    Job# [id number]  EJL/MedQ  DD:  04/26/2006  DT:  04/27/2006  Job #:  [id number]

## 2010-12-10 NOTE — H&P (Signed)
[place]  Patient:    Jesse [name], Jesse [name] Visit Number: [id number] MRN: [id number]          Service Type: SUR Location: 5700 [id number] Attending Physician:  Ivor Messier Dictated by:   Guadelupe Sabin, M.D. Proc. Date: 08/05/01 Admit Date:  08/04/2001   CC:         Shon Hale E. Eulah Pont, M.D.  Marcelyn Bruins. Nile Riggs, M.D.  Dellis Anes Idell Pickles, M.D.  Cecil Cranker, M.D. 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  HISTORY OF PRESENT ILLNESS:  This patient has had previous cataract implant surgery by Dr. Loraine Leriche T. Nile Riggs, his regular ophthalmologist, 2 years ago. Last year the patient was treated for a cloudy capsule with YAG laser capsulotomy.  Approximately 3 days ago the patient began to note the onset of a shadow like sensation at the superior portion of his left eye.  The patient denied any new floaters or lighting flashes.  The patient was seen by Dr. Antony Contras, who was covering the practice of Dr. Jethro Bolus, and was found to have a rhegmatogenous retinal detachment.  The patient was referred to my office where this diagnosis was confirmed and arrangements made for his outpatient admission.  PAST MEDICAL HISTORY:  The patient has a history of arteriosclerotic heart disease, coronary artery disease, with probable myocardial infarction.  The patient has undergone a coronary bypass procedure 1992, with 6 bypasses.  The patient currently is being followed by Dr. Glennon Hamilton, Cardiologist, and is currently taking 1 baby aspirin a day.  His regular physician is Dr. Dellis Anes. Heller.  REVIEW OF SYSTEMS:  No current cardiorespiratory complaints.  FAMILY HISTORY:  Patients mother had glaucoma.  PHYSICAL EXAMINATION:  VITAL SIGNS:  As recorded on admission.  Blood pressure 153/89,  temperature 96.9, respirations 20, pulse 57.  GENERAL: The patient is an anxious, well-nourished, well-developed 77 year old white male in acute ocular distress.  HEENT:  Eyes:  The eyes are white and clear.  Visual acuity following dilation of the pupil by Dr. Eulah Pont in the office reveals with correction 20/30 right eye, less than 20/400 left eye.  Applanation tonometry 25 mL right eye 20 left eye.  Slit lamp examination:  The eyes are white and clear with posterior chamber intraocular lens implants.  There appears to be vitreous in the anterior chamber of the right eye.  The posterior capsule appears open.  The left pupil is dilated but small at approximately 3 mm size.  B-scan ultrasonography:  Inferior retinal detachment.  Detailed fundus examination with indirect ophthalmoscopy left eye:  Visualization is somewhat difficult due to the 3 mm pupil despite vigorous dilatation.  The vitreous is relatively clear.  There is an inferior retinal rhegmatogenous retinal detachment involving the inferior macula.  Tiny retinal holes are noted at the 5 and 7 oclock position.  The optic nerve is sharply outlined, of good color.  Disk cup ratio 0.4.  CHEST:  Lungs clear to percussion and auscultation.  HEART:  Normal sinus rhythm.  No cardiomegaly.  No murmurs.  ABDOMEN:  Negative.  EXTREMITIES:  Negative.  ADMISSION DIAGNOSIS:  Rhegmatogenous retinal detachment left eye, pseudophakia both eyes.  SECONDARY DIAGNOSIS: 1. Arteriosclerotic heart disease. 2. Coronary artery disease. 3. Status post coronary artery bypass surgery.  SURGICAL PLAN:  Preoperative evaluation then scleral buckling procedure left eye  with possible vitrectomy.  The patient has been given oral discussion and printed information concerning the procedure and its complications including anesthetic complications.  He has signed an informed consent and arrangements were made for his emergency admission. Dictated by:    Guadelupe Sabin, M.D. Attending Physician:  Ivor Messier DD:  08/05/01 TD:  08/05/01 Job: [id number]

## 2010-12-10 NOTE — Assessment & Plan Note (Signed)
[facility]                               LIPID CLINIC NOTE   MADDEN, GARRON                         MRN:          [id number]  DATE:10/26/2006                            DOB:          [birth year]    Return office visit for lipid clinic.   PAST MEDICAL HISTORY:  Hyperlipidemia, hypertension, coronary artery  disease status post coronary artery bypass graft 13 years ago, ischemic  cardiomyopathy with ejection fraction of 35%.   MEDICATIONS:  1. Aspirin 81 mg daily.  2. Zinc 50 mg daily.  3. Claritin 10 mg daily.  4. Lipitor 80 mg daily.  5. Diovan HCTZ 80/12.5 daily.  6. Tricor 200 mg daily.  7. Coreg 12.5 twice daily.  8. Flomax 0.4 mg daily.   VITAL SIGNS:  Weight 211 pounds, blood pressure 130/78, heart rate 68.   LABORATORY DATA:  Total cholesterol 123, triglycerides 108, HDL 27, LDL  74.   ASSESSMENT:  Mr. Goshert is a very pleasant gentleman. He returns to the  lipid clinic today with no chest pain, no shortness of breath, no muscle  aches or pains. He is compliant with medication regimen. As seen with  the panel, he has had improvement since last visit. Total cholesterol is  a goal of less than 200, triglycerides a goal of less 150, HDL less than  goal of greater than 40 however improved from last visit and LDL at goal  of less than 914 and improved from last visit. He has not followed  through with his exercise regimen that he had planned on last visit  however he does state that now that spring is coming he will increase  his exercise to 3 times a week. He also is eating a fairly heart healthy  diet with grits and toast in the morning, a low fat sandwich at lunch  and his evening meal is soup or broiled meat, most likely chicken with  vegetables and a snack pack of peanut butter crackers at some point  throughout the week. Given that his weight has not changed and his lipid  panel is trending in the right direction, I have  encouraged him to  increase his exercise and continue his current diet and we will followup  in 6 months.   PLAN:  1. Continue current medication regimen.  2. Increase exercise to 3 times a week.  3. Continue heart health diet.  4. Followup visit in 6 months for a lipid panel and LFTs and will make      adjustments at that time.      Leota Sauers, PharmD  Electronically Signed      Jesse Sans. Daleen Squibb, MD, [facility]  Electronically Signed   LC/MedQ  DD: 10/26/2006  DT: 10/26/2006  Job #: [id number]

## 2010-12-10 NOTE — Assessment & Plan Note (Signed)
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  He also has had an AAA resection  and grafting per Dr. Madilyn Fireman where he has been followed yearly.  He had a CABG  in 1984.  As far back as 10 years ago, his EF was in the 30% range; in 2006,  it was 35-45%; however, the most recent echo on April 26, 2006 revealed an  EF of 55% with slight LV systolic dysfunction.  The patient is feeling well  with no cardiac symptoms.   MEDICATIONS:  1. Aspirin 81.  2. Claritin.  3. Lipitor 80.  4. Diovan HCT 80/12.5.  5. Fenofibrate 200 daily.  6. Coreg 12.5 b.i.d.  (His last note stated he was on 18.75 b.i.d., but we      had actually changed it to 12.5).  7. He is now also on Flomax.   PHYSICAL EXAMINATION:  VITAL SIGNS:  Blood pressure 122/72, pulse 62, normal  sinus rhythm.  GENERAL APPEARANCE:  Normal.  NECK:  JVP is not elevated.  Carotid pulses palpable and equal.  Short bruit  over the right.  LUNGS:  Clear.  CARDIAC:  Normal.  ABDOMEN:  No masses.  There is a large hernia.  EXTREMITIES:  No edema.   ELECTROCARDIOGRAM:  Normal sinus rhythm with left anterior hemiblock with  probable left bundle branch block.   DIAGNOSES:  As above.   We plan to continue the same therapy.  I have considered increasing the  Coreg, but the patient states he is feeling fine, and I think he would like  to remain on the same dose.  We plan to see him back in 3-4 months.  We also  plan to get carotid Dopplers.  He will need lipids on returning.     Cecil Cranker, MD, [facility]  Electronically Signed    EJL/MedQ  DD: 06/07/2006   DT: 06/07/2006  Job #: [id number]

## 2010-12-10 NOTE — Assessment & Plan Note (Signed)
[facility]                              CARDIOLOGY OFFICE NOTE   [name], [name]                         MRN:          [id number]  DATE:05/11/2006                            DOB:          08-08-1927    RETURN OFFICE VISIT FOR LIPID CLINIC:   PAST MEDICAL HISTORY:  1. Hyperlipidemia.  2. Coronary artery disease, status post CABG 13 years ago.  3. Ischemic cardiomyopathy, ejection fraction of 35%.   MEDICATIONS:  1. Carvedilol 12.5 mg twice daily.  2. Fenofibrate 200 mg daily.  3. Diovan/HCTZ 80/12.5 mg daily.  4. Lipitor 80 mg daily.  5. Claritin 10 mg daily.  6. Zinc 50 mg daily.  7. Aspirin 81 mg daily.   PHYSICAL EXAMINATION:  VITAL SIGNS:  Weight 211 pounds.  Blood pressure  120/78.  Heart rate 58.   LAB DATA:  Total cholesterol 132, triglycerides 84, HDL 24.6, LDL 91.  LFTs  within normal limits.   ASSESSMENT:  Jesse [name] is a very pleasant 77 year old male who returns to  the lipid clinic today with no chest pain, no shortness of breath, no muscle  aches or pain.  He is compliant with current medication regimen; however, he  is not compliant with his exercise regimen.  He says that on occasion, he  rides a bike about seven miles or he walks.  He says he attempts to do this  about three times a week, but in reality, it is about once a week.  He has  had an increase in his LDL, and his HDL has remained about stable.  Total  cholesterol goal of less than 200.  Triglycerides a goal of less than 150.  HDL much less than goal of greater than 40, and LDL greater than goal of  less than 70.  He is agreeable to be more compliant with his exercise  regimen.  He says that some days he just does not feel like doing it,  especially in the heat of the summer when it has been extremely humid and  hot.  Now that it is much cooler, he is agreeable to walk three times a week  for 30 minutes.  On cold days, now that winter is approaching, he is  willing  to ride his exercise bike at the [facility], and so will be compliant with  a 3-4 day a week exercise regimen.  He states that he will be compliant with  [place] to prevent having to add another medication such as Niaspan to increase  his HDL, which has been below goal for quite some time.  He does follow a  fairly heart-healthy diet with very little fried food, low carbohydrate, low  fat, lots of fruits and vegetables, and lean protein.   PLAN:  1. Continue current medication regimen with the possibility of adding      Niaspan in the future if he does not follow an exercise regimen.  2. Be compliant with exercise regimen 3-4 days a week of either walking or      riding a bike.  3. Continue heart-healthy diet.  4. Follow-up visit in six months for lipid panel, LFTs, and make changes      that will be needed at that time.     ______________________________  Leota Sauers, PharmD    ______________________________  Jesse Sans. Daleen Squibb, MD, [facility]   LC/MedQ  DD: 05/11/2006  DT: 05/14/2006  Job #: [id number]

## 2010-12-10 NOTE — Discharge Summary (Signed)
[place]  Patient:    Jesse [name], Jesse [name] Visit Number: [id number] MRN: [id number]          Service Type: SUR Location: [id number] Attending Physician:  Ivor Messier Dictated by:   Guadelupe Sabin, M.D. Admit Date:  08/04/2001 Disc. Date: 08/05/01   CC:         Kellie Simmering. Eulah Pont, M.D.  Marcelyn Bruins. Nile Riggs, M.D.  Dellis Anes Idell Pickles, M.D.  Cecil Cranker, M.D. 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 (see detailed admission history and physical).  HOSPITAL COURSE:  The patient was evaluated preoperatively and felt to be in satisfactory condition for the proposed surgery under general anesthesia.  He had signed an informed consent.  The patient was taken into the operating room where a scleral buckling procedure was performed on the left eye using solid silicone implants #277 and #240 with cryoapplication and transcleral drainage of subretinal fluid.  The patient tolerated the 1-3/4 hour procedure under general anesthesia well, and there was minimal subretinal fluid remaining at the end of the procedure.  The patient was transferred to the recovery room and subsequently to the 23 hour observation unit.  The patient was seen the following morning.  His vital signs had stabilized and the patient was experiencing no cardiorespiratory complaints.  Examination of the operated eye revealed a clear vitreous.  There remained some ______ folds and a small amount of subretinal fluid present inferiorly. It was felt, however, that since it had been less than 12 hours since the surgery that the patient could be discharged home in a sitting position to allow further drainage of the subretinal fluid and to allow further absorption of the remaining small amount of subretinal fluid.  The patient was given  a printed list of discharge instructions on the care and use of the operated eye.  DISCHARGE OCULAR MEDICATIONS: 1. Tobradex 2. Cyclomydril ophthalmic solutions, 1 drop 4 times a day, 5 minutes apart 3. Maxitrol and atropine ointment at bedtime.  FOLLOWUP:  My office in 3 days.  CONDITION ON DISCHARGE:  Improved.  PROGNOSIS:  Guarded, however, there is still a small amount of subretinal fluid remaining.  Additional surgery may or may not be required.  DISCHARGE DIAGNOSIS:  Rhegmatogenous retinal detachment left eye, pseudophakia both eyes, following previous cataract implant surgery.  SECONDARY DIAGNOSES: 1. Arteriosclerotic heart disease. 2. Coronary artery disease. 3. Status post coronary artery bypass surgery. Dictated by:   Guadelupe Sabin, M.D. Attending Physician:  Ivor Messier DD:  08/05/01 TD:  08/05/01 Job: [id number]

## 2010-12-10 NOTE — Assessment & Plan Note (Signed)
[facility]                              CARDIOLOGY OFFICE NOTE   NAME:Mount, JAWAD WIACEK                         MRN:          [id number]  DATE:                                      DOB:          07/13/1928    ADDENDUM  Because of the sinus bradycardia and intraventricular conduction delay, and  blood pressure 118, we plan to increase the Coreg to 18.5 twice a day,  rather than the 25 twice a day.            ______________________________  E. Graceann Congress, MD, [facility]     EJL/MedQ  DD:  04/26/2006  DT:  04/27/2006  Job #:  [id number]

## 2010-12-10 NOTE — Op Note (Signed)
[place]  Patient:    Jesse [name], Jesse [name] Visit Number: [id number] MRN: [id number]          Service Type: SUR Location: 5700 5733 [id number] Attending Physician:  Ivor Messier Dictated by:   Guadelupe Sabin, M.D. Proc. Date: 08/04/01 Admit Date:  08/04/2001   CC:         Shon Hale E. Eulah Pont, M.D.  Marcelyn Bruins. Nile Riggs, M.D.  Dellis Anes Idell Pickles, M.D.  Cecil Cranker, M.D. [facility]   Operative Report  PREOPERATIVE DIAGNOSIS:  Rhegmatogenous retinal detachment, left eye, multiple defects.  POSTOPERATIVE DIAGNOSIS:  Rhegmatogenous retinal detachment, left eye, multiple defects.  PROCEDURE:  Scleral buckling procedure, left eye, using solid silicone implants #277 and #240 cryoapplication, diathermy application, transcleral subretinal fluid drainage.  SURGEON:  Guadelupe Sabin, M.D.  ASSISTANT:  Nurse.  ANESTHESIA:  General.  OPHTHALMOSCOPY:  Pupil dilatation is approximately 3 mm and this makes ophthalmoscopy difficult.  DESCRIPTION OF PROCEDURE:  After the patient was prepped and draped, a speculum was inserted in the left eye and traction sutures placed in the left upper and lower lids.  A conjunctival peritomy was performed 360 degrees. There was slight oozing of the conjunctival blood vessels probably related to the patients chronic aspirin administration.  The subconjunctival tissue was cleaned, and the rectus muscles isolated with 4-0 silk traction sutures.  The sclerae was inspected and felt to be in satisfactory condition for the proposed surgery.  Indirect ophthalmoscopy was carried out with localization of the tiny retinal holes at the 5:30 and 7:00 oclock position.  It was elected to perform a lamellar scleral dissection from the 3:30 to 8:30 position the bed measuring 9 mm in width.  Light diathermy applications were applied to the intrascleral lamella.  A total of four 4-0 green M[place]ilene sutures were used to close the scleral  flaps over a trimmed #277 solid silicone implant with four 3-0 plain catgut reinforcements.  A #240 solid silicone encircling band was placed about the globe, tied with two sutures at the 2 oclock position.  Anchoring sutures of 5-0 white Dacron were placed at the 2:30 and 10:00 oclock position to hold the encircling band in place.  Repeat indirect ophthalmoscopy was performed and it was elected to drain fluid through the interlamellar of the sclera at the 5 oclock position.  Incision was with the #64 beaver blade, the choroid exposed, treated with diathermy and then perforated with the 10 electrode.  A moderate amount of subretinal fluid drained.  The scleral flaps were pulled up and the tension of the encircling band adjusted.  There was minimal subretinal fluid remaining inferiorly, although there remained some on the buckle itself.  It was then elected to reopen the drain site and a small amount of additional fluid drained until there was some pigment granules. This was felt to represent the end of the drainage and the buckle was pulled up once again and the flaps closed.  Repeat indirect ophthalmoscopy revealed improvement of the fundus status with less fluid present on the buckle itself.  There was small ______ folds noted at the 4 and 7 oclock position.  It was elected to close feeling that the residual subretinal fluid and folds would flatten with time.  The tension again was adjusted of the encircling band as the eye was rather hypotonus.  It was then elected to close.  Tenons capsule was pulled forward in the 4 quadrants and tied as a separate layer with a 6-0  chromic catgut suture. Neosporin ophthalmic solution was irrigated in the subtenon space.  The conjunctivae was then pulled forward and closed with a running 6-0 chromic catgut suture.  Depo-Garamycin and Depo-Dexamethasone were injected in the subtenon space inferiorly and maxitrol and atropine ointment instilled in  the conjunctival cul-de-sac.  A light patch and protector shield were applied.  Duration of the procedure was 1 and 3/4 hours.  The patient tolerated the procedure well and in general the left operating room for the recovery room in good condition. Dictated by:   Guadelupe Sabin, M.D. Attending Physician:  Ivor Messier DD:  08/05/01 TD:  08/05/01 Job: [id number]

## 2010-12-10 NOTE — Assessment & Plan Note (Signed)
[facility]                              CARDIOLOGY OFFICE NOTE   Jesse [name], Jesse [name]                         MRN:          [id number]  DATE:03/07/2006                            DOB:          Jan 09, 1928    The patient is a very pleasant 77 year old white married male with a long  history of coronary artery disease, hyperlipidemia, hypertension and  ischemic cardiomyopathy.  The patient also had an AAA RNG per Dr. Madilyn Fireman.  His CABG was performed in 1994.  The patient is followed __________  with  fairly good control.  He is having no cardiac symptoms at this time.  He is  scheduled to have his initial colonoscopy done next week.  Most recent  stress test was in February of 2001.   The EF on most recent 2D echo on February 23, 2005, was 35% to 45%.  He had  mild aortic sclerosis and trace mitral regurgitation.   MEDICATIONS:  1. Toprol XL 25 mg.  2. Aspirin 81 mg.  3. Zinc 50 mg.  4. Claritin 10 mg.  5. Lipitor 80 mg.  6. Diovan HCT 80/12.5 mg.  7. Fenofibrate 200 mg.   PHYSICAL EXAMINATION:  VITAL SIGNS:  Blood pressure 120/82, pulse 73, normal  sinus rhythm.  GENERAL APPEARANCE:  Normal.  NECK:  JVD is not elevated.  Carotid pulses are palpable and equal without  bruits.  LUNGS:  Clear.  CARDIAC:  Exam reveals no murmur or gallop.  ABDOMEN:  Exam reveals a large hernia.  EXTREMITIES:  Reveal no edema.   EKG reveals normal sinus rhythm, left anterior hemiblock with LVH.   IMPRESSION:  1. Hyperlipidemia, controlled.  2. Coronary artery disease with 13 years post CABG, with ischemic      cardiomyopathy, EF of 35% to 45%.   We plan to change him from Toprol to Coreg CR 20 mg and up-titrate when he  returns.  Because he is having a colonoscopy and has not had a stress test  in 5 years, I think it would be reasonable for an adenosine Myoview.   Otherwise continue the same therapy.                              Cecil Cranker, MD,  [facility]    EJL/MedQ  DD:  03/07/2006  DT:  03/08/2006  Job #:  [id number]

## 2011-03-15 ENCOUNTER — Other Ambulatory Visit: Payer: Self-pay | Admitting: Cardiovascular Disease

## 2011-04-07 ENCOUNTER — Other Ambulatory Visit: Payer: Self-pay | Admitting: Cardiovascular Disease

## 2011-04-11 ENCOUNTER — Other Ambulatory Visit: Payer: Medicare Other

## 2011-04-14 ENCOUNTER — Ambulatory Visit: Payer: Medicare Other

## 2011-04-21 ENCOUNTER — Ambulatory Visit: Payer: Medicare Other

## 2011-04-21 ENCOUNTER — Other Ambulatory Visit: Payer: Medicare Other

## 2011-04-21 DIAGNOSIS — E7800 Pure hypercholesterolemia, unspecified: Secondary | ICD-10-CM

## 2011-04-21 DIAGNOSIS — Z79899 Other long term (current) drug therapy: Secondary | ICD-10-CM

## 2011-04-22 LAB — HEPATIC FUNCTION PANEL
ALT: 16 U/L (ref 0–53)
AST: 19 U/L (ref 0–37)
Albumin: 4.1 g/dL (ref 3.5–5.2)

## 2011-04-22 LAB — LIPID PANEL
Cholesterol: 136 mg/dL (ref 0–200)
Triglycerides: 66 mg/dL (ref 0.0–149.0)

## 2011-04-28 ENCOUNTER — Ambulatory Visit (INDEPENDENT_AMBULATORY_CARE_PROVIDER_SITE_OTHER): Payer: Medicare Other

## 2011-04-28 VITALS — BP 128/72 | HR 78 | Wt 209.0 lb

## 2011-04-28 DIAGNOSIS — E782 Mixed hyperlipidemia: Secondary | ICD-10-CM

## 2011-04-28 NOTE — Assessment & Plan Note (Addendum)
Assessment: Lab values from 9/28: TC 136 (Goal <200), TG 66 (Goal <150), HDL 36.3 (Goal >40), LDL 87 (Goal <100 [ideal <70]), AST 19, ALT 16. Dyslipidemia currently controlled with medication therapy. LDL remains slightly above ideal goal of <70, however has decreased from last lab draw. Continued improvement in lab work since previous visit. Patient does not voice any complaints or present any issues.  Plan:  1) Continue current medications without change. Report any issues that may arise. 2) Try to limit frozen meals to one per day, and incorporate fresh food options throughout the day. Continue to make healthy dietary decisions. 3) Contact the [employer] regarding the [employer] program. Seek exercise programs that include a social aspect so that they are more enjoyable. 4) Patient will begin following up with Dr. Excell Seltzer for his lipids, as he has been stable and controlled for the past several months. Last office visit with Dr. Excell Seltzer was 09/23/10- patient is due for his 6 month visit and will follow-up at that appointment.

## 2011-04-28 NOTE — Patient Instructions (Signed)
1) Continue with same medications without change. 2) Try to incorporate fresh fruits/vegetables into diet at least once a day.  3) Ask about a trial period with Silver Sneakers, or continue going to the [employer] and for walks as able. 4) Follow up will be with Dr. Excell Seltzer from now on since cholesterol has been at goal for an extended period of time. Due for appointment now (last office visit was March 1st).

## 2011-04-28 NOTE — Progress Notes (Signed)
Jesse [name] is an 77 year old white male presenting to the Lipid Clinic today for his 6 month follow-up appointment for the management of his dyslipidemia. Current cholesterol medications include simvastatin 40 mg daily and fenofibrate 200 mg daily. Patient reports that he is adherent to his medications overall (1-2 missed doses per month) and denies any issues tolerating them.  Upon review, diet overall is simple, as patient states he does not like to use the stove or oven. His wife, who has passed away, used to do all of the cooking. Breakfasts consist of bacon, eggs, cereal, and occasional pastries with juice. Lunch is generally either a sandwich or just a snack such as cottage cheese with fruit. Dinners are frequently frozen meals, with restaurant eating interspersed on occasion. Patient enjoys going out to eat at places such as [employer]. States he tries to stay away from red meats, secondary to his history of coronary artery disease.  [name] reports physical activity a few times per month. He enjoys going to the [employer] to use the weight machines or treadmills, but prefers to be outdoors and will sometimes go for walks. He has not looked into the Silver Sneakers program since his last visit, but says that he still has the phone number and thinks he might look into it now- even if just for a trial period.  Denies alcohol or tobacco use.  Current Outpatient Prescriptions on File Prior to Visit  Medication Sig Dispense Refill  . acetaminophen (TYLENOL) 500 MG tablet Take 500 mg by mouth every 6 (six) hours as needed.        . carvedilol (COREG) 12.5 MG tablet TAKE 1 TABLET TWICE A DAY  180 tablet  2  . fenofibrate micronized (LOFIBRA) 200 MG capsule Take 200 mg by mouth daily.        . hydrochlorothiazide (,MICROZIDE/HYDRODIURIL,) 12.5 MG capsule Take 12.5 mg by mouth daily.        Marland Kitchen loratadine (CLARITIN) 10 MG tablet Take 10 mg by mouth daily.        Marland Kitchen losartan (COZAAR) 50 MG tablet Take  50 mg by mouth daily.        . simvastatin (ZOCOR) 40 MG tablet TAKE 1 TABLET DAILY AT BEDTIME  90 tablet  2  . hydrochlorothiazide (HYDRODIURIL) 12.5 MG tablet Take 12.5 mg by mouth daily.

## 2011-05-23 ENCOUNTER — Encounter: Payer: Self-pay | Admitting: Cardiovascular Disease

## 2011-05-23 ENCOUNTER — Ambulatory Visit (INDEPENDENT_AMBULATORY_CARE_PROVIDER_SITE_OTHER): Payer: Medicare Other | Admitting: Cardiovascular Disease

## 2011-05-23 DIAGNOSIS — I25810 Atherosclerosis of coronary artery bypass graft(s) without angina pectoris: Secondary | ICD-10-CM

## 2011-05-23 DIAGNOSIS — E782 Mixed hyperlipidemia: Secondary | ICD-10-CM

## 2011-05-23 DIAGNOSIS — I10 Essential (primary) hypertension: Secondary | ICD-10-CM

## 2011-05-23 DIAGNOSIS — E7800 Pure hypercholesterolemia, unspecified: Secondary | ICD-10-CM

## 2011-05-23 NOTE — Progress Notes (Signed)
HPI:  This is an 77 year old gentleman presenting for follow evaluation. The patient has coronary artery disease and remote CABG (approximately 20 years ago). At the time of his bypass surgery he really had minimal symptoms but was noted to have an abnormal EKG which led to cardiac catheterization showing multivessel disease. The patient has also undergone AAA repair. He has been less active over the last few years since losing his wife. He denies chest pain or tightness. He does complain of fatigue. He denies dyspnea, orthopnea, PND, or palpitations.  Outpatient Encounter Prescriptions as of 05/23/2011  Medication Sig Dispense Refill  . acetaminophen (TYLENOL) 500 MG tablet Take 500 mg by mouth every 6 (six) hours as needed.        . carvedilol (COREG) 12.5 MG tablet TAKE 1 TABLET TWICE A DAY  180 tablet  2  . fenofibrate micronized (LOFIBRA) 200 MG capsule Take 200 mg by mouth daily.        . hydrochlorothiazide (,MICROZIDE/HYDRODIURIL,) 12.5 MG capsule Take 12.5 mg by mouth daily.        Marland Kitchen loratadine (CLARITIN) 10 MG tablet Take 10 mg by mouth daily.        Marland Kitchen losartan (COZAAR) 50 MG tablet Take 50 mg by mouth daily.        . simvastatin (ZOCOR) 40 MG tablet TAKE 1 TABLET DAILY AT BEDTIME  90 tablet  2  . DISCONTD: hydrochlorothiazide (HYDRODIURIL) 12.5 MG tablet Take 12.5 mg by mouth daily.          No Known Allergies  Past Medical History  Diagnosis Date  . Coronary artery disease   . Hyperlipidemia   . Hypertension     ROS: Negative except as per HPI  BP 124/80  Pulse 62  Resp 18  Ht 6' (1.829 m)  Wt 202 lb 12.8 oz (91.989 kg)  BMI 27.50 kg/m2  PHYSICAL EXAM: Pt is alert and oriented, elderly male in NAD HEENT: normal Neck: JVP - normal, carotids 2+= without bruits Lungs: CTA bilaterally CV: RRR without murmur or gallop Abd: soft, NT, Positive BS, there is a large midline hernia easily reducible Ext: no C/C/E, distal pulses intact and equal Skin: warm/dry no rash  EKG:   Normal sinus rhythm with left bundle branch block, heart rate 62 beats per minute, no significant change from last tracing.  ASSESSMENT AND PLAN:

## 2011-05-23 NOTE — Assessment & Plan Note (Signed)
The patient is stable. He has a left bundle branch on EKG. Since he is 20 years out from coronary bypass surgery and has not had ischemic symptoms even predating his surgery, I recommended a Myoview stress scan. This will be done with Lexiscan because of his underlying left bundle branch block.  We will continue his current medical program.

## 2011-05-23 NOTE — Assessment & Plan Note (Signed)
Lipids reviewed and they have been at goal. The patient is on simvastatin 40 mg. We'll repeat a lipid panel prior to his next office visit in 6 months.

## 2011-05-23 NOTE — Assessment & Plan Note (Signed)
Blood pressure is well controlled on current medications. 

## 2011-05-23 NOTE — Patient Instructions (Signed)
Your physician has requested that you have a lexiscan myoview. For further information please visit [email or web address] Please follow instruction sheet, as given.  Your physician wants you to follow-up in: 6 MONTHS.  You will receive a reminder letter in the mail two months in advance. If you don't receive a letter, please call our office to schedule the follow-up appointment.  Your physician recommends that you return for a FASTING lipid and liver profile in 6 MONTHS--nothing to eat or drink after midnight, lab opens at 8:30 (414.02, 272.0)   Your physician recommends that you continue on your current medications as directed. Please refer to the Current Medication list given to you today.

## 2011-05-24 ENCOUNTER — Other Ambulatory Visit (HOSPITAL_COMMUNITY): Payer: Self-pay | Admitting: Radiology

## 2011-05-24 DIAGNOSIS — I2510 Atherosclerotic heart disease of native coronary artery without angina pectoris: Secondary | ICD-10-CM

## 2011-05-24 DIAGNOSIS — I447 Left bundle-branch block, unspecified: Secondary | ICD-10-CM

## 2011-05-25 ENCOUNTER — Ambulatory Visit (HOSPITAL_COMMUNITY): Payer: Medicare Other | Attending: Cardiovascular Disease | Admitting: Radiology

## 2011-05-25 DIAGNOSIS — I447 Left bundle-branch block, unspecified: Secondary | ICD-10-CM

## 2011-05-25 DIAGNOSIS — I2510 Atherosclerotic heart disease of native coronary artery without angina pectoris: Secondary | ICD-10-CM | POA: Insufficient documentation

## 2011-05-25 DIAGNOSIS — I25810 Atherosclerosis of coronary artery bypass graft(s) without angina pectoris: Secondary | ICD-10-CM

## 2011-05-25 DIAGNOSIS — R0602 Shortness of breath: Secondary | ICD-10-CM

## 2011-05-25 DIAGNOSIS — R9431 Abnormal electrocardiogram [ECG] [EKG]: Secondary | ICD-10-CM

## 2011-05-25 MED ORDER — ADENOSINE (DIAGNOSTIC) 3 MG/ML IV SOLN: 0.8400 mg/kg | Freq: Once | INTRAVENOUS | Status: DC

## 2011-05-25 MED ORDER — TECHNETIUM TC 99M TETROFOSMIN IV KIT
33.0000 | PACK | Freq: Once | INTRAVENOUS | Status: AC | PRN
  Administered 2011-05-25: 33 via INTRAVENOUS

## 2011-05-25 MED ORDER — ADENOSINE (DIAGNOSTIC) 3 MG/ML IV SOLN
0.5600 mg/kg | Freq: Once | INTRAVENOUS | Status: AC
  Administered 2011-05-25: 52.2 mg via INTRAVENOUS

## 2011-05-25 MED ORDER — TECHNETIUM TC 99M TETROFOSMIN IV KIT
11.0000 | PACK | Freq: Once | INTRAVENOUS | Status: AC | PRN
  Administered 2011-05-25: 11 via INTRAVENOUS

## 2011-05-25 NOTE — Progress Notes (Signed)
[facility] SITE 3 NUCLEAR MED [address] [place] [address] [phone]  Cardiology Nuclear Med Study  Lyon GAUTHAM HEWINS is a 77 y.o. male [id number] 10-Oct-1927   Nuclear Med Background Indication for Stress Test:  Evaluation for Ischemia, Graft Patency and Abnormal EKG- new LBBB History: '92 CABG, COPD, '07 Echo: EF 55%, '99 Heart Catheterization: EF 30% patent grafts and '07 Myocardial Perfusion Study: EF 44% inflat scar Cardiac Risk Factors: Family History - CAD, History of Smoking, Hypertension, Lipids and PVD  Symptoms:  DOE, Palpitations and SOB   Nuclear Pre-Procedure Caffeine/Decaff Intake:  None NPO After: 8:00am   Lungs:  clear IV 0.9% NS with Angio Cath:  20g  IV Site: R Antecubital  IV Started by:  Bonnita Levan, RN  Chest Size (in):  48 Cup Size: n/a  Height: 6' (1.829 m)  Weight:  205 lb (92.987 kg)  BMI:  Body mass index is 27.80 kg/(m^2). Tech Comments:  Patient held Meds this AM    Nuclear Med Study 1 or 2 day study: 1 day  Stress Test Type:  Adenosine  Reading MD: [name], MD  Order Authorizing Provider:  Dr. Dayle Points  Resting Radionuclide: Technetium 59m Tetrofosmin  Resting Radionuclide Dose: 11.0 mCi   Stress Radionuclide:  Technetium 22m Tetrofosmin  Stress Radionuclide Dose: 33.0 mCi           Stress Protocol Rest HR: 58 Stress HR: 81  Rest BP: 130/86 Stress BP: 151/86  Exercise Time (min): n/a METS: n/a          Dose of Adenosine (mg):  52.2 Dose of Lexiscan: n/a mg  Dose of Atropine (mg): n/a Dose of Dobutamine: n/a mcg/kg/min (at [place] HR)  Stress Test Technologist: Milana Na, EMT-P  Nuclear Technologist:  Domenic Polite, CNMT     Rest Procedure:  Myocardial perfusion imaging was performed at rest 45 minutes following the intravenous administration of Technetium 1m Tetrofosmin. Rest ECG: NSR-LBBB  Stress Procedure:  The patient received IV adenosine at 140 mcg/kg/min for 4 minutes.  There were no  significant changes, + sob, chest tightness, aching in jaws, and a hot flash with infusion.  Technetium 16m Tetrofosmin was injected at the 2 minute mark and quantitative spect images were obtained after a 45 minute delay. Stress ECG: No significant change from baseline ECG  QPS Raw Data Images:  Normal; no motion artifact; normal heart/lung ratio. Stress Images:  Mildly decreased uptake in inferior and infero-lateral walls. Rest Images:  Mildly decreased uptake in inferior and infero-lateral walls. Subtraction (SDS):  There is a fixed defect that is most consistent with a previous infarction. With trivial peri-infarct ischemia. Transient Ischemic Dilatation (Normal <1.22):  1.03 Lung/Heart Ratio (Normal <0.45):  0.46  Quantitative Gated Spect Images QGS EDV:  177 ml QGS ESV:  88 ml QGS cine images:  Mild inferior HK QGS EF: 50%  Impression Exercise Capacity:  Adenosine study with no exercise. BP Response:  n/a Clinical Symptoms:  n/a ECG Impression:  No significant ST segment change with adenosine. Comparison with Prior Nuclear Study: No significant change from previous study  Overall Impression:  Low risk stress nuclear study. Mildly decreased uptake in inferior and infero-lateral walls consistent with small previous infarct. Trivial peri-infarct ischemia.   Jesse [name]

## 2011-05-26 ENCOUNTER — Other Ambulatory Visit: Payer: Self-pay | Admitting: Cardiovascular Disease

## 2011-06-14 ENCOUNTER — Other Ambulatory Visit: Payer: Self-pay | Admitting: Cardiovascular Disease

## 2011-07-06 ENCOUNTER — Ambulatory Visit (INDEPENDENT_AMBULATORY_CARE_PROVIDER_SITE_OTHER): Payer: Medicare Other | Admitting: *Deleted

## 2011-07-06 ENCOUNTER — Other Ambulatory Visit (INDEPENDENT_AMBULATORY_CARE_PROVIDER_SITE_OTHER): Payer: Medicare Other | Admitting: *Deleted

## 2011-07-06 ENCOUNTER — Other Ambulatory Visit: Payer: Self-pay

## 2011-07-06 DIAGNOSIS — I724 Aneurysm of artery of lower extremity: Secondary | ICD-10-CM

## 2011-07-07 ENCOUNTER — Other Ambulatory Visit: Payer: Self-pay

## 2011-08-08 ENCOUNTER — Encounter: Payer: Self-pay | Admitting: Vascular Surgery

## 2011-08-08 ENCOUNTER — Other Ambulatory Visit: Payer: Self-pay | Admitting: *Deleted

## 2011-08-08 DIAGNOSIS — I724 Aneurysm of artery of lower extremity: Secondary | ICD-10-CM

## 2011-08-08 NOTE — Procedures (Unsigned)
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  HISTORY: Diabetes: Cardiac: Hypertension:  EXAM:  Bilateral lower extremity duplex.  IMPRESSION: 1. Triphasic Doppler waveforms noted throughout the right common     femoral and popliteal arteries with maximum diameter measurements     of 3.6 X 3.8 cm and 2.1 X 2.0 cm, respectively. 2. Triphasic Doppler waveforms noted throughout the left common     femoral and popliteal arteries with maximum diameter measurements     of 3.8 X 3.7 cm and 2.5 X 2.4 cm respectively. 3. Significant increase in the right popliteal artery maximum diameter     when compared to the previous examinations (last examination was     06/16/2010).  Mild increase of the maximum diameter of the left     common femoral artery when compared to the previous examination.     Stable maximum diameter measurements of the right common femoral     and left popliteal arteries noted.  ___________________________________________ Larina Earthly, M.D.  CH/MEDQ  D:  07/07/2011  T:  07/07/2011  Job:  [id number]

## 2011-08-09 ENCOUNTER — Ambulatory Visit (INDEPENDENT_AMBULATORY_CARE_PROVIDER_SITE_OTHER): Payer: Medicare Other | Admitting: Vascular Surgery

## 2011-08-09 ENCOUNTER — Encounter: Payer: Self-pay | Admitting: Vascular Surgery

## 2011-08-09 ENCOUNTER — Other Ambulatory Visit: Payer: Self-pay | Admitting: *Deleted

## 2011-08-09 VITALS — BP 109/74 | HR 68 | Resp 16 | Ht 72.0 in | Wt 209.0 lb

## 2011-08-09 DIAGNOSIS — I724 Aneurysm of artery of lower extremity: Secondary | ICD-10-CM

## 2011-08-09 DIAGNOSIS — I723 Aneurysm of iliac artery: Secondary | ICD-10-CM

## 2011-08-09 NOTE — Progress Notes (Signed)
Vascular and Vein Specialist of [facility]   Patient name: Jesse [name] MRN: [id number] DOB: 10-13-1927 Sex: male     Reason for referral:  Chief Complaint  Patient presents with  . Aneurysm    Iliac Art  . Follow-up    L E Fem/Pop    HISTORY OF PRESENT ILLNESS: The patient has today for discussion of his diffuse aneurysmal disease. He is status post open repair of infrarenal abdominal aortic aneurysm Dr. Madilyn Fireman 1995 area and he has been followed since that time with diffuse peripheral aneurysms. He most recently underwent a duplex evaluation our office in December of 2012 he is here today for discussion of this. He tends to be quite active and has no symptoms referable to this.  Past Medical History  Diagnosis Date  . Coronary artery disease   . Hyperlipidemia   . Hypertension     Past Surgical History  Procedure Date  . Coronary artery bypass graft     History   Social History  . Marital Status: Married    Spouse Name: N/A    Number of Children: N/A  . Years of Education: N/A   Occupational History  . Not on file.   Social History Main Topics  . Smoking status: Never Smoker   . Smokeless tobacco: Not on file  . Alcohol Use: Not on file  . Drug Use: Not on file  . Sexually Active: Not on file   Other Topics Concern  . Not on file   Social History Narrative  . No narrative on file    History reviewed. No pertinent family history.  Allergies as of 08/09/2011  . (No Known Allergies)    Current Outpatient Prescriptions on File Prior to Visit  Medication Sig Dispense Refill  . acetaminophen (TYLENOL) 500 MG tablet Take 500 mg by mouth every 6 (six) hours as needed.        . carvedilol (COREG) 12.5 MG tablet TAKE 1 TABLET TWICE A DAY  180 tablet  2  . fenofibrate micronized (LOFIBRA) 200 MG capsule TAKE 1 CAPSULE DAILY  90 capsule  2  . hydrochlorothiazide (,MICROZIDE/HYDRODIURIL,) 12.5 MG capsule Take 12.5 mg by mouth daily.        . hydrochlorothiazide  (HYDRODIURIL) 12.5 MG tablet TAKE 1 TABLET DAILY  90 tablet  2  . loratadine (CLARITIN) 10 MG tablet Take 10 mg by mouth daily.        Marland Kitchen losartan (COZAAR) 50 MG tablet TAKE 1 TABLET DAILY  90 tablet  2  . simvastatin (ZOCOR) 40 MG tablet TAKE 1 TABLET DAILY AT BEDTIME  90 tablet  2     REVIEW OF SYSTEMS:  Positives indicated with an "X"  CARDIOVASCULAR:  [ ]  chest pain   [ ]  chest pressure   [ ]  palpitations   [ ]  orthopnea   [ ]  dyspnea on exertion   [ ]  claudication   [ ]  rest pain   [ ]  DVT   [ ]  phlebitis PULMONARY:   [ ]  productive cough   [ ]  asthma   [ ]  wheezing NEUROLOGIC:   [ ]  weakness  [ ]  paresthesias  [ ]  aphasia  [ ]  amaurosis  [ ]  dizziness HEMATOLOGIC:   [ ]  bleeding problems   [ ]  clotting disorders MUSCULOSKELETAL:  [ ]  joint pain   [ ]  joint swelling GASTROINTESTINAL: [ ]   blood in stool  [ ]   hematemesis GENITOURINARY:  [ ]   dysuria  [ ]   hematuria PSYCHIATRIC:  [ ]  history of major depression INTEGUMENTARY:  [ ]  rashes  [ ]  ulcers CONSTITUTIONAL:  [ ]  fever   [ ]  chills  PHYSICAL EXAMINATION:  General: The patient is a well-nourished male, in no acute distress. Vital signs are BP 109/74  Pulse 68  Resp 16  Ht 6' (1.829 m)  Wt 209 lb (94.802 kg)  BMI 28.35 kg/m2  SpO2 98% Pulmonary: There is a good air exchange bilaterally without wheezing or rales. Abdomen: Soft and non-tender with normal pitch bowel sounds. Ventral hernia is present Musculoskeletal: There are no major deformities.  There is no significant extremity pain. Neurologic: No focal weakness or paresthesias are detected, Skin: There are no ulcer or rashes noted. Psychiatric: The patient has normal affect. Cardiovascular: There is a regular rate and rhythm without significant murmur appreciated. Pulse status: 3+ femoral popliteal dorsalis pedis pulses bilaterally he does have palpable nontender aneurysms in his groins and popliteal bilaterally  VVS Vascular Lab Studies:  Ordered and  Independently Reviewed duplex reveals a 3.8 cm right common femoral aneurysm and 2.1 cm right popliteal artery aneurysm. Left common femoral artery a 3.8 and the left popliteal artery is 2.5 cm. This is slight increase in systemic and 2011.  Impression and Plan:  Long discussion with Mr. Lambert Mody regarding this. He certainly has indication for potential elective repair of all 4 of these vessels. I think this would be quite a significant undertaking to stage these procedures. I did explain the risk for thrombosis and less risk for rupture of these. Feel that his best course would be continued observation this time. We will see him in 6 months a CT angiogram for more definitive evaluation of all these aneurysms. He knows to report immediately for any symptoms    Hieu Herms F [facility] of [place] Office: [phone]

## 2011-09-26 ENCOUNTER — Other Ambulatory Visit: Payer: Self-pay | Admitting: *Deleted

## 2011-09-26 MED ORDER — CARVEDILOL 12.5 MG PO TABS: 12.5000 mg | ORAL_TABLET | Freq: Two times a day (BID) | ORAL | Status: DC

## 2011-09-26 MED ORDER — SIMVASTATIN 40 MG PO TABS: 40.0000 mg | ORAL_TABLET | Freq: Every day | ORAL | Status: DC

## 2011-11-29 ENCOUNTER — Other Ambulatory Visit: Payer: Self-pay | Admitting: *Deleted

## 2011-11-29 DIAGNOSIS — I723 Aneurysm of iliac artery: Secondary | ICD-10-CM

## 2011-11-29 DIAGNOSIS — I714 Abdominal aortic aneurysm, without rupture: Secondary | ICD-10-CM

## 2012-01-10 ENCOUNTER — Ambulatory Visit: Payer: Medicare Other | Admitting: Vascular Surgery

## 2012-01-12 ENCOUNTER — Telehealth: Payer: Self-pay | Admitting: Vascular Surgery

## 2012-01-12 NOTE — Telephone Encounter (Signed)
Patient called a little confused and upset about his 02/21/12 appointment with [facility] and [facility]. He stated that he received a letter about seeing Dr Arbie Cookey, but not about his CAT scan. I apologized for the confusion and confirmed that he did need to be a [facility] @ 1145a for a CAT scan and then to follow up with [facility] @ 1:00. I will mail his paperwork, in the case that it was mailed incorrectly the first time.   He was also anxious about his insurance paying for his CAT scan. I explained that about a week prior to his test, we would initiate a "pre-certification" to ensure that it was a payable service of his [facility] Medicare carrier, and in the event that it was denied, we would contact him. He said "well, ya'll need to do that and let me know." So I am putting a note on his pre-cert sheet to call the patient with the authorization #.

## 2012-02-17 ENCOUNTER — Telehealth: Payer: Self-pay | Admitting: Vascular Surgery

## 2012-02-17 NOTE — Telephone Encounter (Signed)
I called Jesse [name] to remind him of his appointment with [facility] on 02/21/2012. Jesse [name] confirmed the appointment, but right when I went to hang the phone up, he said he wasn't sure he could have a CTA.   Jesse. [name] said he had 40 treatments of radiation two years ago for cancer, and thought that having a CTA might be harmful for him. I told Jesse [name] that I was not familiar with radiation levels and what effects they may have on him, but I would see if our nurse could give him a call to let him know if he should proceed with his imaging.   Jesse [name] can be reached at [phone]. I am routing this to the clinical pool for a call back to Jesse [name].

## 2012-02-20 ENCOUNTER — Encounter: Payer: Self-pay | Admitting: Vascular Surgery

## 2012-02-20 ENCOUNTER — Other Ambulatory Visit: Payer: Self-pay | Admitting: Vascular Surgery

## 2012-02-20 LAB — CREATININE, SERUM: Creat: 1 mg/dL (ref 0.50–1.35)

## 2012-02-20 NOTE — Telephone Encounter (Signed)
This patient should call [facility] with these concerns. We really don't know how much radiation he will or will not receive. If we need to change orders, we will get the doctor involved.

## 2012-02-21 ENCOUNTER — Ambulatory Visit (INDEPENDENT_AMBULATORY_CARE_PROVIDER_SITE_OTHER): Payer: Medicare Other | Admitting: Vascular Surgery

## 2012-02-21 ENCOUNTER — Encounter: Payer: Self-pay | Admitting: Vascular Surgery

## 2012-02-21 ENCOUNTER — Ambulatory Visit
Admission: RE | Admit: 2012-02-21 | Discharge: 2012-02-21 | Disposition: A | Discharge status: Home / Self Care | Payer: Medicare Other | Source: Ambulatory Visit | Attending: Vascular Surgery | Admitting: Vascular Surgery

## 2012-02-21 VITALS — BP 134/85 | HR 58 | Temp 98.4°F | Ht 72.0 in | Wt 202.0 lb

## 2012-02-21 DIAGNOSIS — I714 Abdominal aortic aneurysm, without rupture: Secondary | ICD-10-CM

## 2012-02-21 DIAGNOSIS — I723 Aneurysm of iliac artery: Secondary | ICD-10-CM

## 2012-02-21 MED ORDER — IOHEXOL 350 MG/ML SOLN
80.0000 mL | Freq: Once | INTRAVENOUS | Status: AC | PRN
  Administered 2012-02-21: 80 mL via INTRAVENOUS

## 2012-02-21 NOTE — Progress Notes (Signed)
The patient has today for followup of diffuse peripheral vascular aneurysms. He remains active with no new major medical difficulties. He has no symptoms referable to these aneurysms. He is status post repair of abdominal aortic aneurysm in 1995 and has had followup of his progressive iliac femoral and popliteal aneurysms. He is here today for discussion after obtaining a CT scan for further definition.  Past Medical History  Diagnosis Date  . Coronary artery disease   . Hyperlipidemia   . Hypertension   . Cancer     prostate     History  Substance Use Topics  . Smoking status: Former Smoker    Types: Cigarettes    Quit date: 07/25/1968  . Smokeless tobacco: Never Used  . Alcohol Use: No    History reviewed. No pertinent family history.  No Known Allergies  Current outpatient prescriptions:acetaminophen (TYLENOL) 500 MG tablet, Take 500 mg by mouth every 6 (six) hours as needed.  , Disp: , Rfl: ;  carvedilol (COREG) 12.5 MG tablet, Take 1 tablet (12.5 mg total) by mouth 2 (two) times daily., Disp: 180 tablet, Rfl: 2;  fenofibrate micronized (LOFIBRA) 200 MG capsule, TAKE 1 CAPSULE DAILY, Disp: 90 capsule, Rfl: 2 hydrochlorothiazide (,MICROZIDE/HYDRODIURIL,) 12.5 MG capsule, Take 12.5 mg by mouth daily.  , Disp: , Rfl: ;  hydrochlorothiazide (HYDRODIURIL) 12.5 MG tablet, TAKE 1 TABLET DAILY, Disp: 90 tablet, Rfl: 2;  loratadine (CLARITIN) 10 MG tablet, Take 10 mg by mouth daily.  , Disp: , Rfl: ;  losartan (COZAAR) 50 MG tablet, TAKE 1 TABLET DAILY, Disp: 90 tablet, Rfl: 2 simvastatin (ZOCOR) 40 MG tablet, Take 1 tablet (40 mg total) by mouth at bedtime., Disp: 90 tablet, Rfl: 2 No current facility-administered medications for this visit. Facility-Administered Medications Ordered in Other Visits: iohexol (OMNIPAQUE) 350 MG/ML injection 80 mL, 80 mL, Intravenous, Once PRN, Medication Radiologist, MD, 80 mL at 02/21/12 1239  BP 134/85  Pulse 58  Temp 98.4 F (36.9 C) (Oral)  Ht 6'  (1.829 m)  Wt 202 lb (91.627 kg)  BMI 27.40 kg/m2  SpO2 100%  Body mass index is 27.40 kg/(m^2).       Physical exam reveals a well-developed white male in no acute distress Carotid arteries without bruits bilaterally Chest clear bilaterally without wheezes Heart regular rate and rhythm Abdomen soft nontender large ventral incisional hernia Aneurysms in both common femoral arteries and both popliteal arteries, nontender Neurologically he is grossly intact Skin without ulcers or rashes  CT scan shows continued progressive size of his common femoral arteries bilaterally both approaching 5 cm in diameter. He does have the iliac artery aneurysms bilaterally which are smaller in the 3-3 and half range.  He does have known to 2.5 cm popliteal artery aneurysms.  Impression and plan: A marked enlargement of bilateral femoral artery aneurysms. I feel that he has reached a point where it is not practical continued observation only. We have scheduled an appointment for him to see Dr. Excell Seltzer for cardiac clearance. Following this we will proceed with bilateral femoral artery aneurysm repair.

## 2012-03-06 ENCOUNTER — Encounter: Payer: Self-pay | Admitting: Cardiovascular Disease

## 2012-03-06 ENCOUNTER — Ambulatory Visit (INDEPENDENT_AMBULATORY_CARE_PROVIDER_SITE_OTHER): Payer: Medicare Other | Admitting: Cardiovascular Disease

## 2012-03-06 VITALS — BP 118/80 | HR 71 | Ht 72.0 in | Wt 201.0 lb

## 2012-03-06 DIAGNOSIS — I25810 Atherosclerosis of coronary artery bypass graft(s) without angina pectoris: Secondary | ICD-10-CM

## 2012-03-06 DIAGNOSIS — E782 Mixed hyperlipidemia: Secondary | ICD-10-CM

## 2012-03-06 DIAGNOSIS — I10 Essential (primary) hypertension: Secondary | ICD-10-CM

## 2012-03-06 NOTE — Patient Instructions (Signed)
Your physician wants you to follow-up in: 1 YEAR with Dr Excell Seltzer.  You will receive a reminder letter in the mail two months in advance. If you don't receive a letter, please call our office to schedule the follow-up appointment.  You are cleared for surgery from a cardiac standpoint.   You can take loratadine or cetirizine over the counter.

## 2012-03-12 ENCOUNTER — Encounter: Payer: Self-pay | Admitting: Vascular Surgery

## 2012-03-13 ENCOUNTER — Other Ambulatory Visit: Payer: Self-pay | Admitting: *Deleted

## 2012-03-13 ENCOUNTER — Encounter: Payer: Self-pay | Admitting: *Deleted

## 2012-03-13 ENCOUNTER — Ambulatory Visit (INDEPENDENT_AMBULATORY_CARE_PROVIDER_SITE_OTHER): Payer: Medicare Other | Admitting: Vascular Surgery

## 2012-03-13 ENCOUNTER — Encounter: Payer: Self-pay | Admitting: Vascular Surgery

## 2012-03-13 VITALS — BP 139/74 | HR 62 | Resp 18 | Ht 72.0 in | Wt 202.2 lb

## 2012-03-13 DIAGNOSIS — I714 Abdominal aortic aneurysm, without rupture: Secondary | ICD-10-CM

## 2012-03-13 NOTE — Progress Notes (Signed)
Patient is here today for a discussion regarding his diffuse peripheral vascular aneurysms. He was seen with an extensive office visit on 02/21/2012. CT scan at that time showed a 5 cm common femoral artery aneurysms bilaterally. I have recommended elective repair and he is been seen by Dr. Cooper for cardiac clearance which has been obtained. He is a very pleasant and inquisitive and we had an extremely long discussion regarding the etiology of his diffuse arteriomegaly in aneurysms and also suggested treatment.  Past Medical History  Diagnosis Date  . Coronary artery disease   . Hyperlipidemia   . Hypertension   . Cancer 2010    prostate (radiation treatments)  . AAA (abdominal aortic aneurysm)   . Arthritis     History  Substance Use Topics  . Smoking status: Former Smoker    Types: Cigarettes    Quit date: 07/25/1968  . Smokeless tobacco: Never Used  . Alcohol Use: No    History reviewed. No pertinent family history.  No Known Allergies  Current outpatient prescriptions:acetaminophen (TYLENOL) 500 MG tablet, Take 500 mg by mouth every 6 (six) hours as needed.  , Disp: , Rfl: ;  carvedilol (COREG) 12.5 MG tablet, Take 1 tablet (12.5 mg total) by mouth 2 (two) times daily., Disp: 180 tablet, Rfl: 2;  fenofibrate micronized (LOFIBRA) 200 MG capsule, TAKE 1 CAPSULE DAILY, Disp: 90 capsule, Rfl: 2 hydrochlorothiazide (HYDRODIURIL) 12.5 MG tablet, TAKE 1 TABLET DAILY, Disp: 90 tablet, Rfl: 2;  loratadine (CLARITIN) 10 MG tablet, Take 10 mg by mouth daily.  , Disp: , Rfl: ;  losartan (COZAAR) 50 MG tablet, TAKE 1 TABLET DAILY, Disp: 90 tablet, Rfl: 2;  Polyvinyl Alcohol-Povidone (REFRESH OP), Apply to eye 3 (three) times daily as needed., Disp: , Rfl:  simvastatin (ZOCOR) 40 MG tablet, Take 1 tablet (40 mg total) by mouth at bedtime., Disp: 90 tablet, Rfl: 2  BP 139/74  Pulse 62  Resp 18  Ht 6' (1.829 m)  Wt 202 lb 3.2 oz (91.717 kg)  BMI 27.42 kg/m2  Body mass index is 27.42  kg/(m^2).       Physical exam is unchanged he has a very large nontender femoral artery aneurysms and also prominent popliteal pulse I should this as well.  Impression and plan 5 cm common femoral artery aneurysms which expanded significantly from 3.8-5 cm in the last year and a half. I have recommended staged elective repair. We will proceed initially with his right common femoral artery aneurysm and then repair his left common femoral artery several weeks after recovery. 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 I explained that in all likelihood he should recover quite nicely in the no limitation by that time 

## 2012-03-14 ENCOUNTER — Encounter (HOSPITAL_COMMUNITY): Payer: Self-pay | Admitting: Respiratory Therapy

## 2012-03-14 NOTE — Pre-Procedure Instructions (Signed)
[address]  03/14/2012   Your procedure is scheduled on:  Monday August 26,2013.  Report to [facility] at 0530 AM.  Call this number if you have problems the morning of surgery: [phone]   Remember:   Do not eat food or drink:After Midnight.    Take these medicines the morning of surgery with A SIP OF WATER: Carvedilol (Coreg),    Do not wear jewelry  Do not wear lotions or colognes.  Men may shave face and neck.  Do not bring valuables to the hospital.  Contacts, dentures or bridgework may not be worn into surgery.  Leave suitcase in the car. After surgery it may be brought to your room.  For patients admitted to the hospital, checkout time is 11:00 AM the day of discharge.   Patients discharged the day of surgery will not be allowed to drive home.  Name and phone number of your driver:   Special Instructions: CHG Shower Use Special Wash: 1/2 bottle night before surgery and 1/2 bottle morning of surgery.   Please read over the following fact sheets that you were given: Pain Booklet, Coughing and Deep Breathing, MRSA Information and Surgical Site Infection Prevention

## 2012-03-15 ENCOUNTER — Encounter (HOSPITAL_COMMUNITY)
Admission: RE | Admit: 2012-03-15 | Discharge: 2012-03-15 | Disposition: A | Discharge status: Home / Self Care | Payer: Medicare Other | Source: Ambulatory Visit | Attending: Vascular Surgery | Admitting: Vascular Surgery

## 2012-03-15 ENCOUNTER — Other Ambulatory Visit: Payer: Self-pay | Admitting: *Deleted

## 2012-03-15 ENCOUNTER — Encounter (HOSPITAL_COMMUNITY): Payer: Self-pay

## 2012-03-15 HISTORY — DX: Acute myocardial infarction, unspecified: I21.9

## 2012-03-15 HISTORY — DX: Pneumonia, unspecified organism: J18.9

## 2012-03-15 HISTORY — DX: Bronchitis, not specified as acute or chronic: J40

## 2012-03-15 HISTORY — DX: Calculus of kidney: N20.0

## 2012-03-15 LAB — COMPREHENSIVE METABOLIC PANEL
ALT: 13 U/L (ref 0–53)
AST: 14 U/L (ref 0–37)
Albumin: 3.9 g/dL (ref 3.5–5.2)
Calcium: 10 mg/dL (ref 8.4–10.5)
Chloride: 104 mEq/L (ref 96–112)
Creatinine, Ser: 1.05 mg/dL (ref 0.50–1.35)
Sodium: 138 mEq/L (ref 135–145)
Total Bilirubin: 0.7 mg/dL (ref 0.3–1.2)

## 2012-03-15 LAB — URINALYSIS, ROUTINE W REFLEX MICROSCOPIC
Bilirubin Urine: NEGATIVE
Glucose, UA: NEGATIVE mg/dL
Hgb urine dipstick: NEGATIVE
Specific Gravity, Urine: 1.018 (ref 1.005–1.030)

## 2012-03-15 LAB — CBC
MCV: 89.3 fL (ref 78.0–100.0)
Platelets: 219 10*3/uL (ref 150–400)
RBC: 4.56 MIL/uL (ref 4.22–5.81)
RDW: 13.4 % (ref 11.5–15.5)
WBC: 5.5 10*3/uL (ref 4.0–10.5)

## 2012-03-15 LAB — APTT: aPTT: 31 seconds (ref 24–37)

## 2012-03-15 LAB — SURGICAL PCR SCREEN: Staphylococcus aureus: NEGATIVE

## 2012-03-15 LAB — PROTIME-INR
INR: 1.14 (ref 0.00–1.49)
Prothrombin Time: 14.8 seconds (ref 11.6–15.2)

## 2012-03-15 MED ORDER — FENOFIBRATE MICRONIZED 200 MG PO CAPS: 200.0000 mg | ORAL_CAPSULE | Freq: Every day | ORAL | Status: DC

## 2012-03-15 NOTE — Progress Notes (Signed)
Patient informed Nurse that he had a stress test approximately 1 year ago with Dr. Excell Seltzer at [facility]. Patient had a CAGB in 1992 but denied having any angina or shortness of breath, patient also denied having a recent cardiac cath, or ever having a sleep study. LOV requested from Dr. Ihor Gully at [facility] office # [phone]; fax # [phone].

## 2012-03-15 NOTE — Telephone Encounter (Signed)
..   Requested Prescriptions   Signed Prescriptions Disp Refills  . fenofibrate micronized (LOFIBRA) 200 MG capsule 30 capsule 6    Sig: Take 1 capsule (200 mg total) by mouth daily before breakfast.    Authorizing Provider: Tonny Bollman    Ordering User: Christella Hartigan, Lindsay Soulliere Judie Petit

## 2012-03-16 ENCOUNTER — Encounter: Payer: Self-pay | Admitting: Cardiovascular Disease

## 2012-03-16 NOTE — Assessment & Plan Note (Signed)
Blood pressure is well controlled on carvedilol and hydrochlorothiazide and losartan

## 2012-03-16 NOTE — Assessment & Plan Note (Signed)
The patient is stable without anginal symptoms. He had a low risk stress nuclear study within the last 12 months. He is at moderately increased cardiac risk with his upcoming vascular surgery because of advanced age and remote coronary bypass surgery. However, his risk is certainly not prohibitive and considering his asymptomatic status I would recommend proceeding with surgery without further testing at this time. He is on a stable medical program and his perioperative beta-blockade should be continued without interruption. I would be happy to see him if there are any problems in the postoperative period.

## 2012-03-16 NOTE — Assessment & Plan Note (Signed)
Lipids and LFTs reviewed with the last 12 months. The patient is on a combination of simvastatin and fenofibrate.

## 2012-03-16 NOTE — Progress Notes (Signed)
   HPI:  78 year old gentleman presenting for followup evaluation. The patient is seen in preoperative evaluation for upcoming femoral artery aneurysm repair. He has undergone previous abdominal aortic aneurysm repair. He has a history of coronary artery disease with remote coronary bypass surgery.  Overall the patient is doing fairly well. He has generalized fatigue, but no specific complaints. He denies chest pain or pressure, dyspnea, palpitations, edema, lightheadedness, or syncope.  The patient had a nuclear stress test 05/25/2011. This demonstrated a left ventricular ejection fraction of 50%. There was mildly decreased uptake in the inferior and inferolateral walls consistent with a small previous infarction. Trivial peri-infarct ischemia was noted. The study was low risk.  Outpatient Encounter Prescriptions as of 03/06/2012  Medication Sig Dispense Refill  . acetaminophen (TYLENOL) 500 MG tablet Take 500 mg by mouth every 6 (six) hours as needed.        . carvedilol (COREG) 12.5 MG tablet Take 1 tablet (12.5 mg total) by mouth 2 (two) times daily.  180 tablet  2  . loratadine (CLARITIN) 10 MG tablet Take 10 mg by mouth daily.        . Polyvinyl Alcohol-Povidone (REFRESH OP) Apply 1 drop to eye 3 (three) times daily as needed. For dry eyes      . simvastatin (ZOCOR) 40 MG tablet Take 1 tablet (40 mg total) by mouth at bedtime.  90 tablet  2  . DISCONTD: fenofibrate micronized (LOFIBRA) 200 MG capsule TAKE 1 CAPSULE DAILY  90 capsule  2  . DISCONTD: hydrochlorothiazide (HYDRODIURIL) 12.5 MG tablet TAKE 1 TABLET DAILY  90 tablet  2  . DISCONTD: losartan (COZAAR) 50 MG tablet TAKE 1 TABLET DAILY  90 tablet  2  . DISCONTD: hydrochlorothiazide (,MICROZIDE/HYDRODIURIL,) 12.5 MG capsule Take 12.5 mg by mouth daily.        Marland Kitchen DISCONTD: tobramycin-dexamethasone (TOBRADEX) ophthalmic solution as needed.       No Known Allergies  Past Medical History  Diagnosis Date  . Coronary artery disease   .  Hyperlipidemia   . Hypertension   . Cancer 2010    prostate (radiation treatments)  . AAA (abdominal aortic aneurysm)   . Arthritis   . Myocardial infarction 1992  . Pneumonia     hx of  . Bronchitis     hx of appro 40 years ago  . Kidney stone 1968   ROS: Negative except as per HPI  BP 118/80  Pulse 71  Ht 6' (1.829 m)  Wt 91.173 kg (201 lb)  BMI 27.26 kg/m2  PHYSICAL EXAM: Pt is alert and oriented, pleasant elderly gentleman in NAD HEENT: normal Neck: JVP - normal, carotids 2+= without bruits Lungs: CTA bilaterally CV: RRR without murmur or gallop Abd: soft, NT, Positive BS, no hepatomegaly Ext: no C/C/E, distal pulses intact and equal, enlarged pulsatile femoral arteries bilaterally Skin: warm/dry no rash  EKG:  Sinus rhythm with occasional PVCs, left bundle branch block. No significant change from previous tracing.  ASSESSMENT AND PLAN:

## 2012-03-18 MED ORDER — DEXTROSE 5 % IV SOLN
1.5000 g | INTRAVENOUS | Status: AC
  Administered 2012-03-19: 1.5 g via INTRAVENOUS
  Filled 2012-03-18: qty 1.5

## 2012-03-19 ENCOUNTER — Inpatient Hospital Stay (HOSPITAL_COMMUNITY)
Admission: RE | Admit: 2012-03-19 | Discharge: 2012-03-21 | DRG: 254 | Disposition: A | Discharge status: Home / Self Care | Payer: Medicare Other | Source: Ambulatory Visit | Attending: Vascular Surgery | Admitting: Vascular Surgery

## 2012-03-19 ENCOUNTER — Encounter (HOSPITAL_COMMUNITY)
Admission: RE | Disposition: A | Discharge status: Home / Self Care | Payer: Self-pay | Source: Ambulatory Visit | Attending: Vascular Surgery

## 2012-03-19 ENCOUNTER — Encounter (HOSPITAL_COMMUNITY): Payer: Self-pay | Admitting: *Deleted

## 2012-03-19 ENCOUNTER — Telehealth: Payer: Self-pay | Admitting: Vascular Surgery

## 2012-03-19 ENCOUNTER — Encounter (HOSPITAL_COMMUNITY): Payer: Self-pay | Admitting: Certified Registered"

## 2012-03-19 ENCOUNTER — Ambulatory Visit (HOSPITAL_COMMUNITY): Payer: Medicare Other | Admitting: Certified Registered"

## 2012-03-19 DIAGNOSIS — I252 Old myocardial infarction: Secondary | ICD-10-CM

## 2012-03-19 DIAGNOSIS — Z8546 Personal history of malignant neoplasm of prostate: Secondary | ICD-10-CM

## 2012-03-19 DIAGNOSIS — I739 Peripheral vascular disease, unspecified: Secondary | ICD-10-CM | POA: Diagnosis present

## 2012-03-19 DIAGNOSIS — Z923 Personal history of irradiation: Secondary | ICD-10-CM

## 2012-03-19 DIAGNOSIS — I724 Aneurysm of artery of lower extremity: Secondary | ICD-10-CM

## 2012-03-19 DIAGNOSIS — Z951 Presence of aortocoronary bypass graft: Secondary | ICD-10-CM

## 2012-03-19 DIAGNOSIS — I723 Aneurysm of iliac artery: Secondary | ICD-10-CM | POA: Diagnosis present

## 2012-03-19 DIAGNOSIS — Z79899 Other long term (current) drug therapy: Secondary | ICD-10-CM

## 2012-03-19 DIAGNOSIS — I10 Essential (primary) hypertension: Secondary | ICD-10-CM | POA: Diagnosis present

## 2012-03-19 DIAGNOSIS — R509 Fever, unspecified: Secondary | ICD-10-CM | POA: Diagnosis not present

## 2012-03-19 DIAGNOSIS — I2510 Atherosclerotic heart disease of native coronary artery without angina pectoris: Secondary | ICD-10-CM | POA: Diagnosis present

## 2012-03-19 DIAGNOSIS — Z87891 Personal history of nicotine dependence: Secondary | ICD-10-CM

## 2012-03-19 DIAGNOSIS — Z01812 Encounter for preprocedural laboratory examination: Secondary | ICD-10-CM

## 2012-03-19 DIAGNOSIS — E782 Mixed hyperlipidemia: Secondary | ICD-10-CM | POA: Diagnosis present

## 2012-03-19 HISTORY — PX: OTHER SURGICAL HISTORY: SHX169

## 2012-03-19 LAB — CREATININE, SERUM
Creatinine, Ser: 1 mg/dL (ref 0.50–1.35)
GFR calc Af Amer: 79 mL/min — ABNORMAL LOW (ref >90)
GFR calc non Af Amer: 68 mL/min — ABNORMAL LOW (ref >90)

## 2012-03-19 LAB — CBC
HCT: 36.1 % — ABNORMAL LOW (ref 39.0–52.0)
Hemoglobin: 12 g/dL — ABNORMAL LOW (ref 13.0–17.0)
RBC: 4.08 MIL/uL — ABNORMAL LOW (ref 4.22–5.81)
WBC: 7.4 10*3/uL (ref 4.0–10.5)

## 2012-03-19 SURGERY — ENDARTERECTOMY, FEMORAL
Anesthesia: General | Site: Leg Upper | Laterality: Right | Wound class: Clean

## 2012-03-19 MED ORDER — ROCURONIUM BROMIDE 100 MG/10ML IV SOLN
INTRAVENOUS | Status: DC | PRN
  Administered 2012-03-19: 50 mg via INTRAVENOUS

## 2012-03-19 MED ORDER — OXYCODONE HCL 5 MG PO TABS: 5.0000 mg | ORAL_TABLET | Freq: Four times a day (QID) | ORAL | Status: DC | PRN

## 2012-03-19 MED ORDER — 0.9 % SODIUM CHLORIDE (POUR BTL) OPTIME
TOPICAL | Status: DC | PRN
  Administered 2012-03-19: 2000 mL

## 2012-03-19 MED ORDER — GUAIFENESIN-DM 100-10 MG/5ML PO SYRP: 15.0000 mL | ORAL_SOLUTION | ORAL | Status: DC | PRN

## 2012-03-19 MED ORDER — GLYCOPYRROLATE 0.2 MG/ML IJ SOLN
INTRAMUSCULAR | Status: DC | PRN
  Administered 2012-03-19: 0.2 mg via INTRAVENOUS

## 2012-03-19 MED ORDER — SODIUM CHLORIDE 0.9 % IV SOLN: INTRAVENOUS | Status: DC

## 2012-03-19 MED ORDER — POTASSIUM CHLORIDE CRYS ER 20 MEQ PO TBCR: 20.0000 meq | EXTENDED_RELEASE_TABLET | Freq: Once | ORAL | Status: AC | PRN

## 2012-03-19 MED ORDER — SIMVASTATIN 40 MG PO TABS
40.0000 mg | ORAL_TABLET | Freq: Every day | ORAL | Status: DC
  Administered 2012-03-19 – 2012-03-20 (×2): 40 mg via ORAL
  Filled 2012-03-19 (×3): qty 1

## 2012-03-19 MED ORDER — PROTAMINE SULFATE 10 MG/ML IV SOLN
INTRAVENOUS | Status: DC | PRN
  Administered 2012-03-19 (×2): 10 mg via INTRAVENOUS
  Administered 2012-03-19: 20 mg via INTRAVENOUS
  Administered 2012-03-19: 10 mg via INTRAVENOUS

## 2012-03-19 MED ORDER — MORPHINE SULFATE 2 MG/ML IJ SOLN
2.0000 mg | INTRAMUSCULAR | Status: DC | PRN
Start: 2012-03-19 — End: 2012-03-21
  Administered 2012-03-19 – 2012-03-20 (×5): 2 mg via INTRAVENOUS
  Filled 2012-03-19 (×5): qty 1

## 2012-03-19 MED ORDER — LACTATED RINGERS IV SOLN
INTRAVENOUS | Status: DC | PRN
  Administered 2012-03-19 (×3): via INTRAVENOUS

## 2012-03-19 MED ORDER — DEXTROSE 5 % IV SOLN
1.5000 g | Freq: Two times a day (BID) | INTRAVENOUS | Status: AC
  Administered 2012-03-19 – 2012-03-20 (×2): 1.5 g via INTRAVENOUS
  Filled 2012-03-19 (×2): qty 1.5

## 2012-03-19 MED ORDER — TEMAZEPAM 15 MG PO CAPS: 15.0000 mg | ORAL_CAPSULE | Freq: Every evening | ORAL | Status: DC | PRN

## 2012-03-19 MED ORDER — DOPAMINE-DEXTROSE 3.2-5 MG/ML-% IV SOLN: 3.0000 ug/kg/min | INTRAVENOUS | Status: DC

## 2012-03-19 MED ORDER — ONDANSETRON HCL 4 MG/2ML IJ SOLN
INTRAMUSCULAR | Status: DC | PRN
  Administered 2012-03-19: 4 mg via INTRAVENOUS

## 2012-03-19 MED ORDER — MIDAZOLAM HCL 5 MG/5ML IJ SOLN
INTRAMUSCULAR | Status: DC | PRN
  Administered 2012-03-19: 2 mg via INTRAVENOUS

## 2012-03-19 MED ORDER — CARBOXYMETHYLCELLULOSE SODIUM 1 % OP SOLN
1.0000 [drp] | Freq: Three times a day (TID) | OPHTHALMIC | Status: DC | PRN
Start: 2012-03-19 — End: 2012-03-19

## 2012-03-19 MED ORDER — ACETAMINOPHEN 500 MG PO TABS
500.0000 mg | ORAL_TABLET | Freq: Four times a day (QID) | ORAL | Status: DC | PRN
  Administered 2012-03-20 (×3): 500 mg via ORAL
  Filled 2012-03-19: qty 1
  Filled 2012-03-19: qty 2
  Filled 2012-03-19: qty 1

## 2012-03-19 MED ORDER — LIDOCAINE HCL (CARDIAC) 20 MG/ML IV SOLN
INTRAVENOUS | Status: DC | PRN
  Administered 2012-03-19: 60 mg via INTRAVENOUS

## 2012-03-19 MED ORDER — HYDROMORPHONE HCL PF 1 MG/ML IJ SOLN
0.2500 mg | INTRAMUSCULAR | Status: DC | PRN
  Administered 2012-03-19: 0.5 mg via INTRAVENOUS

## 2012-03-19 MED ORDER — LOSARTAN POTASSIUM 50 MG PO TABS
50.0000 mg | ORAL_TABLET | Freq: Every day | ORAL | Status: DC
  Administered 2012-03-19 – 2012-03-21 (×3): 50 mg via ORAL
  Filled 2012-03-19 (×3): qty 1

## 2012-03-19 MED ORDER — HEPARIN SODIUM (PORCINE) 1000 UNIT/ML IJ SOLN
INTRAMUSCULAR | Status: DC | PRN
  Administered 2012-03-19: 9000 [IU] via INTRAVENOUS

## 2012-03-19 MED ORDER — ENOXAPARIN SODIUM 30 MG/0.3ML ~~LOC~~ SOLN
30.0000 mg | SUBCUTANEOUS | Status: DC
  Administered 2012-03-20: 30 mg via SUBCUTANEOUS
  Filled 2012-03-19: qty 0.3

## 2012-03-19 MED ORDER — ACETAMINOPHEN 650 MG RE SUPP: 325.0000 mg | RECTAL | Status: DC | PRN

## 2012-03-19 MED ORDER — HYDROMORPHONE HCL PF 1 MG/ML IJ SOLN
INTRAMUSCULAR | Status: AC
  Filled 2012-03-19: qty 1

## 2012-03-19 MED ORDER — ALUM & MAG HYDROXIDE-SIMETH 200-200-20 MG/5ML PO SUSP: 15.0000 mL | ORAL | Status: DC | PRN

## 2012-03-19 MED ORDER — PROPOFOL 10 MG/ML IV EMUL
INTRAVENOUS | Status: DC | PRN
  Administered 2012-03-19: 200 mg via INTRAVENOUS

## 2012-03-19 MED ORDER — OXYCODONE HCL 5 MG PO TABS
5.0000 mg | ORAL_TABLET | Freq: Four times a day (QID) | ORAL | Status: DC | PRN
  Administered 2012-03-20 – 2012-03-21 (×4): 5 mg via ORAL
  Filled 2012-03-19 (×4): qty 1

## 2012-03-19 MED ORDER — LABETALOL HCL 5 MG/ML IV SOLN
10.0000 mg | INTRAVENOUS | Status: DC | PRN
  Filled 2012-03-19: qty 4

## 2012-03-19 MED ORDER — KCL IN DEXTROSE-NACL 20-5-0.45 MEQ/L-%-% IV SOLN
INTRAVENOUS | Status: AC
  Administered 2012-03-19: 1000 mL
  Filled 2012-03-19: qty 1000

## 2012-03-19 MED ORDER — POLYVINYL ALCOHOL 1.4 % OP SOLN
1.0000 [drp] | Freq: Three times a day (TID) | OPHTHALMIC | Status: DC | PRN
  Filled 2012-03-19: qty 15

## 2012-03-19 MED ORDER — METOPROLOL TARTRATE 1 MG/ML IV SOLN: 2.0000 mg | INTRAVENOUS | Status: DC | PRN

## 2012-03-19 MED ORDER — PHENOL 1.4 % MT LIQD
1.0000 | OROMUCOSAL | Status: DC | PRN
  Administered 2012-03-20 – 2012-03-21 (×3): 1 via OROMUCOSAL
  Filled 2012-03-19 (×2): qty 177

## 2012-03-19 MED ORDER — EPHEDRINE SULFATE 50 MG/ML IJ SOLN
INTRAMUSCULAR | Status: DC | PRN
  Administered 2012-03-19: 10 mg via INTRAVENOUS
  Administered 2012-03-19: 5 mg via INTRAVENOUS
  Administered 2012-03-19 (×3): 10 mg via INTRAVENOUS

## 2012-03-19 MED ORDER — LORATADINE 10 MG PO TABS
10.0000 mg | ORAL_TABLET | Freq: Every day | ORAL | Status: DC
  Administered 2012-03-19 – 2012-03-21 (×3): 10 mg via ORAL
  Filled 2012-03-19 (×3): qty 1

## 2012-03-19 MED ORDER — CARVEDILOL 12.5 MG PO TABS
12.5000 mg | ORAL_TABLET | Freq: Two times a day (BID) | ORAL | Status: DC
  Administered 2012-03-19 – 2012-03-21 (×3): 12.5 mg via ORAL
  Filled 2012-03-19 (×5): qty 1

## 2012-03-19 MED ORDER — PHENYLEPHRINE HCL 10 MG/ML IJ SOLN
INTRAMUSCULAR | Status: DC | PRN
  Administered 2012-03-19: 80 ug via INTRAVENOUS
  Administered 2012-03-19 (×3): 40 ug via INTRAVENOUS

## 2012-03-19 MED ORDER — HYDRALAZINE HCL 20 MG/ML IJ SOLN
10.0000 mg | INTRAMUSCULAR | Status: DC | PRN
  Filled 2012-03-19: qty 0.5

## 2012-03-19 MED ORDER — ONDANSETRON HCL 4 MG/2ML IJ SOLN: 4.0000 mg | Freq: Four times a day (QID) | INTRAMUSCULAR | Status: DC | PRN

## 2012-03-19 MED ORDER — KCL IN DEXTROSE-NACL 20-5-0.45 MEQ/L-%-% IV SOLN
INTRAVENOUS | Status: DC
  Administered 2012-03-19 – 2012-03-20 (×2): via INTRAVENOUS
  Filled 2012-03-19 (×3): qty 1000

## 2012-03-19 MED ORDER — HYDROCHLOROTHIAZIDE 12.5 MG PO CAPS
12.5000 mg | ORAL_CAPSULE | Freq: Every day | ORAL | Status: DC
  Administered 2012-03-19 – 2012-03-21 (×3): 12.5 mg via ORAL
  Filled 2012-03-19 (×3): qty 1

## 2012-03-19 MED ORDER — SODIUM CHLORIDE 0.9 % IV SOLN: 500.0000 mL | Freq: Once | INTRAVENOUS | Status: AC | PRN

## 2012-03-19 MED ORDER — FENTANYL CITRATE 0.05 MG/ML IJ SOLN
INTRAMUSCULAR | Status: DC | PRN
  Administered 2012-03-19 (×2): 50 ug via INTRAVENOUS

## 2012-03-19 MED ORDER — DOCUSATE SODIUM 100 MG PO CAPS
100.0000 mg | ORAL_CAPSULE | Freq: Every day | ORAL | Status: DC
  Administered 2012-03-21: 100 mg via ORAL
  Filled 2012-03-19 (×2): qty 1

## 2012-03-19 MED ORDER — SODIUM CHLORIDE 0.9 % IR SOLN
Status: DC | PRN
  Administered 2012-03-19: 08:00:00

## 2012-03-19 MED ORDER — PANTOPRAZOLE SODIUM 40 MG PO TBEC
40.0000 mg | DELAYED_RELEASE_TABLET | Freq: Every day | ORAL | Status: DC
  Administered 2012-03-19 – 2012-03-21 (×3): 40 mg via ORAL
  Filled 2012-03-19 (×3): qty 1

## 2012-03-19 SURGICAL SUPPLY — 55 items
BANDAGE ESMARK 6X9 LF (GAUZE/BANDAGES/DRESSINGS) IMPLANT
BENZOIN TINCTURE PRP APPL 2/3 (GAUZE/BANDAGES/DRESSINGS) ×2 IMPLANT
BNDG ESMARK 6X9 LF (GAUZE/BANDAGES/DRESSINGS)
CANISTER SUCTION 2500CC (MISCELLANEOUS) ×2 IMPLANT
CLIP LIGATING EXTRA MED SLVR (CLIP) ×2 IMPLANT
CLIP LIGATING EXTRA SM BLUE (MISCELLANEOUS) ×2 IMPLANT
CLOTH BEACON ORANGE TIMEOUT ST (SAFETY) ×2 IMPLANT
COVER SURGICAL LIGHT HANDLE (MISCELLANEOUS) ×2 IMPLANT
CUFF TOURNIQUET SINGLE 18IN (TOURNIQUET CUFF) IMPLANT
CUFF TOURNIQUET SINGLE 24IN (TOURNIQUET CUFF) IMPLANT
CUFF TOURNIQUET SINGLE 34IN LL (TOURNIQUET CUFF) IMPLANT
CUFF TOURNIQUET SINGLE 44IN (TOURNIQUET CUFF) IMPLANT
DRAIN SNY 10X20 3/4 PERF (WOUND CARE) IMPLANT
DRAPE WARM FLUID 44X44 (DRAPE) ×2 IMPLANT
DRAPE X-RAY CASS 24X20 (DRAPES) IMPLANT
DRSG COVADERM 4X6 (GAUZE/BANDAGES/DRESSINGS) ×2 IMPLANT
DRSG COVADERM 4X8 (GAUZE/BANDAGES/DRESSINGS) IMPLANT
ELECT REM PT RETURN 9FT ADLT (ELECTROSURGICAL) ×2
ELECTRODE REM PT RTRN 9FT ADLT (ELECTROSURGICAL) ×1 IMPLANT
EVACUATOR SILICONE 100CC (DRAIN) IMPLANT
GLOVE BIOGEL M 6.5 STRL (GLOVE) ×8 IMPLANT
GLOVE BIOGEL PI IND STRL 6.5 (GLOVE) ×4 IMPLANT
GLOVE BIOGEL PI IND STRL 7.0 (GLOVE) ×1 IMPLANT
GLOVE BIOGEL PI INDICATOR 6.5 (GLOVE) ×4
GLOVE BIOGEL PI INDICATOR 7.0 (GLOVE) ×1
GLOVE ECLIPSE 7.0 STRL STRAW (GLOVE) ×2 IMPLANT
GLOVE SS BIOGEL STRL SZ 7.5 (GLOVE) ×1 IMPLANT
GLOVE SUPERSENSE BIOGEL SZ 7.5 (GLOVE) ×1
GLOVE SURG SS PI 6.5 STRL IVOR (GLOVE) ×4 IMPLANT
GOWN STRL NON-REIN LRG LVL3 (GOWN DISPOSABLE) ×12 IMPLANT
GRAFT CV 30X10STRG TUBE (Vascular Products) ×1 IMPLANT
GRAFT HEMASHIELD 10MM (Vascular Products) ×1 IMPLANT
KIT BASIN OR (CUSTOM PROCEDURE TRAY) ×2 IMPLANT
KIT ROOM TURNOVER OR (KITS) ×2 IMPLANT
NS IRRIG 1000ML POUR BTL (IV SOLUTION) ×4 IMPLANT
PACK PERIPHERAL VASCULAR (CUSTOM PROCEDURE TRAY) ×2 IMPLANT
PAD ARMBOARD 7.5X6 YLW CONV (MISCELLANEOUS) ×4 IMPLANT
PADDING CAST COTTON 6X4 STRL (CAST SUPPLIES) IMPLANT
SET COLLECT BLD 21X3/4 12 (NEEDLE) IMPLANT
STAPLER VISISTAT 35W (STAPLE) IMPLANT
STOPCOCK 4 WAY LG BORE MALE ST (IV SETS) IMPLANT
STRIP CLOSURE SKIN 1/2X4 (GAUZE/BANDAGES/DRESSINGS) ×2 IMPLANT
SUT ETHILON 3 0 PS 1 (SUTURE) IMPLANT
SUT PROLENE 3 0 SH DA (SUTURE) ×4 IMPLANT
SUT PROLENE 5 0 C 1 24 (SUTURE) ×8 IMPLANT
SUT PROLENE 6 0 CC (SUTURE) ×2 IMPLANT
SUT VIC AB 2-0 CTX 36 (SUTURE) ×4 IMPLANT
SUT VIC AB 3-0 SH 27 (SUTURE) ×2
SUT VIC AB 3-0 SH 27X BRD (SUTURE) ×2 IMPLANT
TOWEL OR 17X24 6PK STRL BLUE (TOWEL DISPOSABLE) ×4 IMPLANT
TOWEL OR 17X26 10 PK STRL BLUE (TOWEL DISPOSABLE) ×4 IMPLANT
TRAY FOLEY CATH 14FRSI W/METER (CATHETERS) ×2 IMPLANT
TUBING EXTENTION W/L.L. (IV SETS) IMPLANT
UNDERPAD 30X30 INCONTINENT (UNDERPADS AND DIAPERS) ×2 IMPLANT
WATER STERILE IRR 1000ML POUR (IV SOLUTION) ×2 IMPLANT

## 2012-03-19 NOTE — Transfer of Care (Signed)
Immediate Anesthesia Transfer of Care Note  Patient: Jesse [name]  Procedure(s) Performed: Procedure(s) (LRB): ENDARTERECTOMY FEMORAL (Right)  Patient Location: PACU  Anesthesia Type: General  Level of Consciousness: awake, alert  and oriented  Airway & Oxygen Therapy: Patient Spontanous Breathing and Patient connected to nasal cannula oxygen  Post-op Assessment: Report given to PACU RN and Post -op Vital signs reviewed and stable  Post vital signs: Reviewed and stable  Complications: No apparent anesthesia complications

## 2012-03-19 NOTE — Telephone Encounter (Addendum)
Message copied by Rosalyn Charters on Mon Mar 19, 2012  1:35 PM ------      Message from: [place], [place] K      Created: Mon Mar 19, 2012 11:35 AM      Regarding: schedule                   ----- Message -----         From: Dara Lords, PA         Sent: 03/19/2012  10:08 AM           To: Sharee Pimple, CMA            [name],Jesse [name], 78 y.o., 11-Nov-1927            Repair right femoral art aneurysm [facility] today.            Fu with [facility] in 2 weeks.            Thanks,      Jesse [name]   notified patient of fu appt. with dr. early on April 03, 2012 at 3:00 p.m.

## 2012-03-19 NOTE — Anesthesia Postprocedure Evaluation (Signed)
  Anesthesia Post-op Note  Patient: Jesse [name]  Procedure(s) Performed: Procedure(s) (LRB): ENDARTERECTOMY FEMORAL (Right)  Patient Location: PACU  Anesthesia Type: General  Level of Consciousness: awake  Airway and Oxygen Therapy: Patient Spontanous Breathing  Post-op Pain: moderate  Post-op Assessment: Post-op Vital signs reviewed  Post-op Vital Signs: Reviewed  Complications: No apparent anesthesia complications

## 2012-03-19 NOTE — Interval H&P Note (Signed)
History and Physical Interval Note:  03/19/2012 7:26 AM  Jesse [name]  has presented today for surgery, with the diagnosis of BILATERAL CFA  ANEURYSM  The various methods of treatment have been discussed with the patient and family. After consideration of risks, benefits and other options for treatment, the patient has consented to  Procedure(s) (LRB): ENDARTERECTOMY FEMORAL (Right) as a surgical intervention .  The patient's history has been reviewed, patient examined, no change in status, stable for surgery.  I have reviewed the patient's chart and labs.  Questions were answered to the patient's satisfaction.     Makaley Storts

## 2012-03-19 NOTE — Op Note (Signed)
OPERATIVE REPORT  DATE OF SURGERY: 03/19/2012  PATIENT: Jesse [name], 78 y.o. male MRN: [id number]  DOB: 03-03-1928  PRE-OPERATIVE DIAGNOSIS: Right common femoral artery aneurysm  POST-OPERATIVE DIAGNOSIS:  Same  PROCEDURE: Resection of right common femoral artery aneurysm and replacement with 10 mm Hemashield graft end and the superficial femoral artery and reimplantation of the profundus femoris artery into the graft  SURGEON:  Gretta Began, M.D.  PHYSICIAN ASSISTANT: Rhyne  ANESTHESIA:  Gen.  EBL: 400 ml  Total I/O In: 2100 [I.V.:2100] Out: 700 [Urine:300; Blood:400]  BLOOD ADMINISTERED: None  DRAINS: None  SPECIMEN: Femoral aneurysm  COUNTS CORRECT:  YES  PLAN OF CARE: PACU   PATIENT DISPOSITION:  PACU - hemodynamically stable  PROCEDURE DETAILS: The patient was taken up replacing that is where the area the right groin was prepped and draped in usual sterile fashion. The patient had a prior aorto right femoral bypass. Patient had extensively growing true aneurysm in his common femoral artery with the graft anastomosed to this. Bilateral aneurysm was recommended he undergo staged repair since both were now 5 cm.  Scissors made to the old scar and carried down to isolate the right limb of the aortofemoral graft this was encircled with a vessel loop. The superficial femoral artery was encircled with a vessel loop and the profundus femoris arteries were also isolated. The patient did have diffuse arteriomegaly. The common femoral artery was aneurysmal up under the inguinal ligament and this was controlled as well. After exposure and control of all vessels the patient was given 9000 intravenous heparin. After adequate circulation time the right limb of aortofemoral graft the common femoral the superficial femoral and function was arteries were all occluded with vascular clamps. The anastomosis was taken down off the old right limb of aortofemoral graft. The common femoral artery  proximally up under the inguinal ligament was oversewn with 3-0 Prolene sutures. CT scan did show that the common femoral artery external iliac artery were chronically occluded with some collateral flow into this proximally. This was oversewn with a 3-0 Prolene suture. Next a 10 mm Hemashield graft was brought onto the field. Due to the diffuse arteriomegaly this was a good size match for the superficial femoral artery and the profundus femoris artery. An ellipse of graft was removed and the back wall of the 10 mm graft and this was sewn with 5-0 Prolene suture with an end to side anastomosis of the profundus femoris artery to the back wall of the 10 mm graft. This anastomosis was tested and found to be adequate. The graft was then cut to appropriate length and was sewn end to end to the superficial femoral artery with a running 5-0 Prolene suture. This anastomosis was tested and also adequate. The graft was flushed with heparinized saline and reoccluded. Finally the old limb of the aortofemoral graft was transected and the new graft cut to appropriate length and sewn end to end to the old graft with a running 5-0 Prolene suture. Prior to completion of the anastomosis the usual flushing maneuvers were undertaken. Anastomosis was completed and flow is restored. The patient was given 50 mg of protamine to reverse the heparin. The wounds were irrigated with saline. Hemostasis obtained electrocautery. The wounds were closed with 2-0 Vicryl in several layers in the subcutaneous tissue. The skin was closed with 3-0 subcuticular Vicryl stitch. Sterile dressing was applied and the patient was taken to the recovery room in stable condition   Gretta Began, M.D. 03/19/2012 10:31 AM

## 2012-03-19 NOTE — Progress Notes (Signed)
Utilization review completed.  

## 2012-03-19 NOTE — H&P (View-Only) (Signed)
Patient is here today for a discussion regarding his diffuse peripheral vascular aneurysms. He was seen with an extensive office visit on 02/21/2012. CT scan at that time showed a 5 cm common femoral artery aneurysms bilaterally. I have recommended elective repair and he is been seen by Dr. Excell Seltzer for cardiac clearance which has been obtained. He is a very pleasant and inquisitive and we had an extremely long discussion regarding the etiology of his diffuse arteriomegaly in aneurysms and also suggested treatment.  Past Medical History  Diagnosis Date  . Coronary artery disease   . Hyperlipidemia   . Hypertension   . Cancer 2010    prostate (radiation treatments)  . AAA (abdominal aortic aneurysm)   . Arthritis     History  Substance Use Topics  . Smoking status: Former Smoker    Types: Cigarettes    Quit date: 07/25/1968  . Smokeless tobacco: Never Used  . Alcohol Use: No    History reviewed. No pertinent family history.  No Known Allergies  Current outpatient prescriptions:acetaminophen (TYLENOL) 500 MG tablet, Take 500 mg by mouth every 6 (six) hours as needed.  , Disp: , Rfl: ;  carvedilol (COREG) 12.5 MG tablet, Take 1 tablet (12.5 mg total) by mouth 2 (two) times daily., Disp: 180 tablet, Rfl: 2;  fenofibrate micronized (LOFIBRA) 200 MG capsule, TAKE 1 CAPSULE DAILY, Disp: 90 capsule, Rfl: 2 hydrochlorothiazide (HYDRODIURIL) 12.5 MG tablet, TAKE 1 TABLET DAILY, Disp: 90 tablet, Rfl: 2;  loratadine (CLARITIN) 10 MG tablet, Take 10 mg by mouth daily.  , Disp: , Rfl: ;  losartan (COZAAR) 50 MG tablet, TAKE 1 TABLET DAILY, Disp: 90 tablet, Rfl: 2;  Polyvinyl Alcohol-Povidone (REFRESH OP), Apply to eye 3 (three) times daily as needed., Disp: , Rfl:  simvastatin (ZOCOR) 40 MG tablet, Take 1 tablet (40 mg total) by mouth at bedtime., Disp: 90 tablet, Rfl: 2  BP 139/74  Pulse 62  Resp 18  Ht 6' (1.829 m)  Wt 202 lb 3.2 oz (91.717 kg)  BMI 27.42 kg/m2  Body mass index is 27.42  kg/(m^2).       Physical exam is unchanged he has a very large nontender femoral artery aneurysms and also prominent popliteal pulse I should this as well.  Impression and plan 5 cm common femoral artery aneurysms which expanded significantly from 3.8-5 cm in the last year and a half. I have recommended staged elective repair. We will proceed initially with his right common femoral artery aneurysm and then repair his left common femoral artery several weeks after recovery. 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 I explained that in all likelihood he should recover quite nicely in the no limitation by that time

## 2012-03-19 NOTE — Discharge Summary (Signed)
[facility] Discharge Summary  Jesse [name] 1928-01-28 78 y.o. male  [id number]  Admission Date: 03/19/2012  Discharge Date: 03/21/2012   Physician: Larina Earthly, MD  Admission Diagnosis: BILATERAL CFA  ANEURYSM   HPI:   This is a 78 y.o. male here today for a discussion regarding his diffuse peripheral vascular aneurysms. He was seen with an extensive office visit on 02/21/2012. CT scan at that time showed a 5 cm common femoral artery aneurysms bilaterally. I have recommended elective repair and he is been seen by Dr. Excell Seltzer for cardiac clearance which has been obtained. He is a very pleasant and inquisitive and we had an extremely long discussion regarding the etiology of his diffuse arteriomegaly in aneurysms and also suggested treatment.  Hospital Course:  The patient was admitted to the hospital and taken to the operating room on 03/19/2012 and underwent Procedure(s):Resection of right common femoral artery aneurysm and replacement with 10 mm Hemashield graft end and the superficial femoral artery and reimplantation of the profundus femoris artery into the graft   The pt tolerated the procedure well and was transported to the PACU in good condition. He had a low grade temp thought to be atelectasis and he was using IS well. His wounds were healing well and he has a palpable right DP pulse.  The remainder of the hospital course consisted of increasing mobilization and increasing intake of solids without difficulty.  Labs: Lab Results  Component Value Date   WBC 8.4 03/20/2012   HGB 11.8* 03/20/2012   HCT 35.2* 03/20/2012   MCV 89.1 03/20/2012   PLT 188 03/20/2012     Discharge Instructions:   The patient is discharged to home with extensive instructions on wound care and progressive ambulation.  They are instructed not to drive or perform any heavy lifting until returning to see the physician in his office.  Discharge Orders    Future Orders Please Complete  By Expires   Resume previous diet      Driving Restrictions      Comments:   No driving for 2 weeks   Lifting restrictions      Comments:   No lifting for 6 weeks   Call MD for:  temperature >100.5      Call MD for:  redness, tenderness, or signs of infection (pain, swelling, bleeding, redness, odor or green/yellow discharge around incision site)      Call MD for:  severe or increased pain, loss or decreased feeling  in affected limb(s)      Discharge wound care:      Comments:   Shower daily with soap and water starting 03/21/12      Discharge Diagnosis:  BILATERAL CFA  ANEURYSM  Secondary Diagnosis: Patient Active Problem List  Diagnosis  . MIXED HYPERLIPIDEMIA  . HYPERLIPIDEMIA, BORDERLINE  . HYPERTENSION  . CAD, ARTERY BYPASS GRAFT  . PERIPHERAL VASCULAR DISEASE  . ABDOMINAL AORTIC ANEURYSM REPAIR, HX OF  . Aneurysm of iliac artery  . Aneurysm of artery of lower extremity  . Abdominal aneurysm without mention of rupture   Past Medical History  Diagnosis Date  . Coronary artery disease   . Hyperlipidemia   . Hypertension   . Cancer 2010    prostate (radiation treatments)  . AAA (abdominal aortic aneurysm)   . Arthritis   . Myocardial infarction 1992  . Pneumonia     hx of  . Bronchitis     hx of appro 40 years ago  .  Kidney stone 1968      [name], Jesse  Home Medication Instructions [id number]   Printed on:03/19/12 1011  Medication Information                    loratadine (CLARITIN) 10 MG tablet Take 10 mg by mouth daily.             acetaminophen (TYLENOL) 500 MG tablet Take 500 mg by mouth every 6 (six) hours as needed.             carvedilol (COREG) 12.5 MG tablet Take 1 tablet (12.5 mg total) by mouth 2 (two) times daily.           simvastatin (ZOCOR) 40 MG tablet Take 1 tablet (40 mg total) by mouth at bedtime.           Polyvinyl Alcohol-Povidone (REFRESH OP) Apply 1 drop to eye 3 (three) times daily as needed. For dry eyes             Polyethyl Glycol-Propyl Glycol (SYSTANE OP) Place 1 drop into both eyes 2 (two) times daily.           hydrochlorothiazide (HYDRODIURIL) 12.5 MG tablet Take 12.5 mg by mouth daily.           losartan (COZAAR) 50 MG tablet Take 50 mg by mouth daily.           fenofibrate micronized (LOFIBRA) 200 MG capsule Take 1 capsule (200 mg total) by mouth daily before breakfast.           oxyCODONE (ROXICODONE) 5 MG immediate release tablet Take 1 tablet (5 mg total) by mouth every 6 (six) hours as needed for pain. #30 NR            Disposition: DC to home  Patient's condition: is Good  Follow up: 1. Dr. Arbie Cookey in 2 weeks   Doreatha Massed, PA-C [facility] [phone] 03/19/2012  10:11 AM

## 2012-03-19 NOTE — Anesthesia Preprocedure Evaluation (Addendum)
Anesthesia Evaluation  Patient identified by MRN, date of birth, ID band Patient awake    Reviewed: Allergy & Precautions, H&P , NPO status , Patient's Chart, lab work & pertinent test results, reviewed documented beta blocker date and time   History of Anesthesia Complications Negative for: history of anesthetic complications  Airway Mallampati: I TM Distance: >3 FB     Dental  (+) Teeth Intact   Pulmonary pneumonia -,  breath sounds clear to auscultation        Cardiovascular hypertension, Pt. on medications and Pt. on home beta blockers + CAD, + Past MI and + CABG (CABG times 6 1992) Rhythm:Regular Rate:Normal     Neuro/Psych    GI/Hepatic Neg liver ROS,   Endo/Other    Renal/GU Renal disease     Musculoskeletal   Abdominal   Peds  Hematology negative hematology ROS (+)   Anesthesia Other Findings   Reproductive/Obstetrics                         Anesthesia Physical Anesthesia Plan  ASA: III  Anesthesia Plan: General   Post-op Pain Management:    Induction: Intravenous  Airway Management Planned: Oral ETT  Additional Equipment:   Intra-op Plan:   Post-operative Plan: Extubation in OR  Informed Consent: I have reviewed the patients History and Physical, chart, labs and discussed the procedure including the risks, benefits and alternatives for the proposed anesthesia with the patient or authorized representative who has indicated his/her understanding and acceptance.   Dental advisory given  Plan Discussed with: Anesthesiologist, Surgeon and CRNA  Anesthesia Plan Comments:        Anesthesia Quick Evaluation

## 2012-03-20 DIAGNOSIS — Z48812 Encounter for surgical aftercare following surgery on the circulatory system: Secondary | ICD-10-CM

## 2012-03-20 LAB — CBC
HCT: 35.2 % — ABNORMAL LOW (ref 39.0–52.0)
Platelets: 188 10*3/uL (ref 150–400)
RDW: 13.5 % (ref 11.5–15.5)
WBC: 8.4 10*3/uL (ref 4.0–10.5)

## 2012-03-20 LAB — BASIC METABOLIC PANEL
Chloride: 103 mEq/L (ref 96–112)
GFR calc Af Amer: 89 mL/min — ABNORMAL LOW (ref >90)
GFR calc non Af Amer: 77 mL/min — ABNORMAL LOW (ref >90)
Potassium: 3.9 mEq/L (ref 3.5–5.1)
Sodium: 134 mEq/L — ABNORMAL LOW (ref 135–145)

## 2012-03-20 MED ORDER — ENOXAPARIN SODIUM 40 MG/0.4ML ~~LOC~~ SOLN
40.0000 mg | SUBCUTANEOUS | Status: DC
  Administered 2012-03-21: 40 mg via SUBCUTANEOUS
  Filled 2012-03-20: qty 0.4

## 2012-03-20 NOTE — Progress Notes (Signed)
Subjective: Interval History: none.. sitting up in chair. Has been up walking in the halls   Objective: Vital signs in last 24 hours: Temp:  [97.2 F (36.2 C)-98.5 F (36.9 C)] 97.7 F (36.5 C) (08/27 0400) Pulse Rate:  [58-73] 72  (08/27 0400) Resp:  [10-24] 17  (08/27 0400) BP: (105-145)/(61-83) 114/63 mmHg (08/27 0400) SpO2:  [96 %-99 %] 97 % (08/27 0400) Weight:  [200 lb 5 oz (90.861 kg)-201 lb 1 oz (91.2 kg)] 201 lb 1 oz (91.2 kg) (08/26 1152)  Intake/Output from previous day: 08/26 0701 - 08/27 0700 In: 3525 [I.V.:3525] Out: 2300 [Urine:1900; Blood:400] Intake/Output this shift: Total I/O In: 825 [I.V.:825] Out: 1025 [Urine:1025]  3+ popliteal pulse bilaterally. Right groin dressing is intact without hematoma.  Lab Results:  Basename 03/19/12 1330  WBC 7.4  HGB 12.0*  HCT 36.1*  PLT 182   BMET  Basename 03/19/12 1330  NA --  K --  CL --  CO2 --  GLUCOSE --  BUN --  CREATININE 1.00  CALCIUM --    Studies/Results: Dg Chest 2 View  03/15/2012  *RADIOLOGY REPORT*  Clinical Data: Preoperative assessment for endarterectomy, history coronary artery disease, hypertension, abdominal aortic aneurysm, MI  CHEST - 2 VIEW  Comparison: None.  Findings: Upper normal heart size post CABG. Calcified tortuous aorta. Pulmonary vascularity normal. Left basilar scarring. Emphysematous changes without infiltrate or effusion. No pneumothorax. Minimal height loss of a mid thoracic vertebra, age indeterminate. Osseous demineralization.  IMPRESSION: Emphysematous changes with left basilar scarring. Post CABG.   Original Report Authenticated By: Lollie Marrow, M.D.    Ct Angio Abd/pel W/ And/or W/o  02/21/2012  *RADIOLOGY REPORT*  Clinical Data:  Aneurysm post open repair, peripheral aneurysms  CT ANGIOGRAPHY ABDOMEN AND PELVIS  Technique:  Multidetector CT imaging of the abdomen and pelvis was performed using the standard protocol during bolus administration of intravenous contrast.   Multiplanar reconstructed images including MIPs were obtained and reviewed to evaluate the vascular anatomy.  Contrast: 80mL OMNIPAQUE IOHEXOL 350 MG/ML SOLN  Comparison:  12/13/1999 by report only  Arterial findings: Aorta:                  Visualized distal descending thoracic aorta is mildly tortuous and ectatic measuring up to 3.8cm diameter just above the diaphragm.  There is scattered calcified plaque in the proximal abdominal aorta.  Changes of the aortobi-iliac grafting with maximum  native sac diameter 3.5 cm.  No significant intraluminal thrombus.  No dissection or stenosis.  Celiac axis:            Partially calcified nonocclusive ostial plaque, widely patent distally  Superior mesenteric:Scattered nonocclusive calcified plaque in its midportion.  Classic distal branching anatomy.  Left renal:             Single, patent proximally.  The distal main left renal artery has a somewhat lobular ectatic appearance measuring up to 12 mm diameter at its bifurcation.  There is calcified plaque in the proximal aspect of the anterior division without discrete aneurysm.  Right renal:            Scattered nonocclusive plaque through the main renal artery.  Ectasia at its bifurcation, with the posterior division branch measuring 12 mm diameter over a short segment.  Inferior mesenteric: Origin occlusion,  distal reconstitution by visceral collaterals.  Left iliac:             Calcified plaque through the common iliac without aneurysm or  stenosis.  3.4 cm aneurysm of the left internal iliac with some mural thrombus.  Mild plaque and an ectatic external iliac artery.  There is a fusiform aneurysm of the common femoral artery up to 4.7 cm transverse diameter extending to involve its bifurcation, with a moderate amount of intraluminal thrombus.  The proximal SFA is ectatic up to 16 mm diameter.  Right iliac:            Graft extends to the right common femoral artery, distal anastomosis widely patent.  Native common ,  proximal internal iliac, and external iliac are thrombosed. There is a nonenhancing internal iliac 4.2 cm aneurysm.    There is a 4.3 cm diameter fusiform aneurysm of the common femoral artery, extending to its bifurcation.  Proximal SFA is ectatic measuring 2 cm diameter.  Venous findings:  Dedicated venous imaging was not obtained.   Review of the MIP images confirms the above findings.  Nonvascular findings: Linear scarring or subsegmental atelectasis posteriorly in the visualized lung bases, left greater than right. Previous median sternotomy.  Extensive coronary arterial calcifications are noted.  Multiple sub centimeter stones are layering in the gallbladder nondistended gallbladder.  Unremarkable arterial phase evaluation of liver, spleen, adrenal glands, pancreas.  2.5 cm probable cyst in the interpolar region of the right kidney.  No hydronephrosis.  Stomach, small bowel, and colon are decompressed.  Urinary bladder incompletely distended. Metallic densities in the region of the prostate.  No free air.  No ascites.  No adenopathy localized.  Mild spondylitic changes in the lumbar spine.  IMPRESSION:  1.  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 2.  Thrombosed 4.2 cm right internal iliac artery aneurysm 3.  4.7 cm left common femoral artery aneurysm with mural thrombus. 4.  4.3 cm right common femoral artery aneurysm. 5.  Cholelithiasis.  Original Report Authenticated By: Osa Craver, M.D.   Anti-infectives: Anti-infectives     Start     Dose/Rate Route Frequency Ordered Stop   03/19/12 1400   cefUROXime (ZINACEF) 1.5 g in dextrose 5 % 50 mL IVPB        1.5 g 100 mL/hr over 30 Minutes Intravenous Every 12 hours 03/19/12 1156 03/20/12 0406   03/18/12 1317   cefUROXime (ZINACEF) 1.5 g in dextrose 5 % 50 mL IVPB        1.5 g 100 mL/hr over 30 Minutes Intravenous 30 min pre-op 03/18/12 1317 03/19/12 0757          Assessment/Plan: s/p Procedure(s) (LRB): ENDARTERECTOMY FEMORAL  (Right) Table postop day 1. We will transfer to unit 2000 and DC Foley catheter. The plan discharge in the a.m.   LOS: 1 day   Gregoria Selvy 03/20/2012, 6:50 AM

## 2012-03-20 NOTE — Evaluation (Signed)
Physical Therapy Evaluation Patient Details Name: Jesse [name] MRN: [id number] DOB: 1928/02/16 Today's Date: 03/20/2012 Time: 1478-2956 PT Time Calculation (min): 31 min  PT Assessment / Plan / Recommendation Clinical Impression  pt s/p resection of femoral aneurysm with grafting.  Mobility limited by pain, but generally WFL.  Pt should be safe to return home with children checking in on him.  No further PT needs.    PT Assessment  Patent does not need any further PT services    Follow Up Recommendations  No PT follow up    Barriers to Discharge        Equipment Recommendations  None recommended by PT    Recommendations for Other Services     Frequency      Precautions / Restrictions Restrictions Weight Bearing Restrictions: No   Pertinent Vitals/Pain       Mobility  Bed Mobility Bed Mobility: Supine to Sit;Sitting - Scoot to Edge of Bed Supine to Sit: 6: Modified independent (Device/Increase time) Sitting - Scoot to Edge of Bed: 7: Independent Transfers Transfers: Sit to Stand;Stand to Sit Sit to Stand: 6: Modified independent (Device/Increase time) Stand to Sit: 6: Modified independent (Device/Increase time) Ambulation/Gait Ambulation/Gait Assistance: 7: Independent Ambulation Distance (Feet): 150 Feet Assistive device: None Ambulation/Gait Assistance Details: mildly unsteady gait that improved with quicker gait speed.  Stability did not worsen with scanning, turns or start/stops Gait Pattern: Within Functional Limits Stairs: No    Exercises     PT Diagnosis:    PT Problem List:   PT Treatment Interventions:     PT Goals    Visit Information  Last PT Received On: 03/20/12 Assistance Needed: +1 PT/OT Co-Evaluation/Treatment: Yes    Subjective Data  Subjective: I really don't need any help Patient Stated Goal: Back Independent   Prior Functioning  Home Living Lives With: Alone Available Help at Discharge:  (son and daughter but live out of  town) Type of Home: House Home Access: Stairs to enter Secretary/administrator of Steps: 3 Entrance Stairs-Rails: Can reach both Home Layout: Two level;Able to live on main level with bedroom/bathroom Bathroom Shower/Tub: Tub/shower unit;C[employer]ain Firefighter: Standard Home Adaptive Equipment: Grab bars in shower;Straight cane Prior Function Level of Independence: Independent Able to Take Stairs?: Yes Driving: Yes Vocation:  (muscian, play professionally) Communication Communication: No difficulties Dominant Hand: Right    Cognition  Overall Cognitive Status: Appears within functional limits for tasks assessed/performed Arousal/Alertness: Awake/alert Orientation Level: Appears intact for tasks assessed Behavior During Session: [facility] for tasks performed    Extremity/Trunk Assessment Right Lower Extremity Assessment RLE ROM/Strength/Tone: [facility] for tasks assessed Left Lower Extremity Assessment LLE ROM/Strength/Tone: Within functional levels Trunk Assessment Trunk Assessment: Normal   Balance    End of Session PT - End of Session Equipment Utilized During Treatment: Gait belt Activity Tolerance: Patient tolerated treatment well Patient left: in chair;with call bell/phone within reach Nurse Communication: Mobility status  GP     Salman Wellen, Eliseo Gum 03/20/2012, 1:11 PM  03/20/2012  Dillard Bing, PT [phone] (pager)

## 2012-03-20 NOTE — Progress Notes (Signed)
VASCULAR LAB PRELIMINARY  ARTERIAL  ABI completed:    RIGHT    LEFT    PRESSURE WAVEFORM  PRESSURE WAVEFORM  BRACHIAL 94 T BRACHIAL 95 T  DP 101 T DP 110 T  AT   AT    PT 118 T PT 108 T  PER   PER    GREAT TOE  NA GREAT TOE  NA    RIGHT LEFT  ABI >1.0 >1.0     Jesse [name], 03/20/2012, 9:14 AM

## 2012-03-20 NOTE — Evaluation (Signed)
Occupational Therapy Evaluation Patient Details Name: Jesse [name] MRN: [id number] DOB: 1927/10/25 Today's Date: 03/20/2012 Time: 1478-2956 OT Time Calculation (min): 31 min  OT Assessment / Plan / Recommendation Clinical Impression  78 yo male admitted for endarterectomy femoral rt removal of aneurysm that does not require skilled OT acutely    OT Assessment  Patient does not need any further OT services    Follow Up Recommendations  No OT follow up    Barriers to Discharge      Equipment Recommendations  None recommended by OT    Recommendations for Other Services    Frequency       Precautions / Restrictions Precautions Precautions: None Restrictions Weight Bearing Restrictions: No   Pertinent Vitals/Pain None    ADL  Grooming: Performed;Wash/dry face;Modified independent;Shaving Where Assessed - Grooming: Unsupported standing Lower Body Dressing: Performed;Set up Where Assessed - Lower Body Dressing: Unsupported sitting Toilet Transfer: Simulated;Modified independent Toilet Transfer Method: Sit to Barista: Regular height toilet Transfers/Ambulation Related to ADLs: pt ambulating with balance challenges and no balance deficits noted ADL Comments: Pt completed bed mobility, ambulating in hall for balance testing, sink level grooming and transfer to chair with no acute OT needs    OT Diagnosis:    OT Problem List:   OT Treatment Interventions:     OT Goals    Visit Information  Last OT Received On: 03/20/12 Assistance Needed: +1 PT/OT Co-Evaluation/Treatment: Yes    Subjective Data  Subjective: "thank you young folks" Patient Stated Goal: to return home   Prior Functioning  Vision/Perception  Home Living Lives With: Alone Available Help at Discharge:  (son and daughter but live out of town) Type of Home: House Home Access: Stairs to enter Secretary/administrator of Steps: 3 Entrance Stairs-Rails: Can reach both Home Layout:  Two level;Able to live on main level with bedroom/bathroom Bathroom Shower/Tub: Tub/shower unit;Curtain Firefighter: Standard Home Adaptive Equipment: Grab bars in shower;Straight cane Prior Function Level of Independence: Independent Able to Take Stairs?: Yes Driving: Yes Vocation:  (muscian, play professionally) Communication Communication: No difficulties Dominant Hand: Right      Cognition  Overall Cognitive Status: Appears within functional limits for tasks assessed/performed Arousal/Alertness: Awake/alert Orientation Level: Appears intact for tasks assessed Behavior During Session: [facility] for tasks performed    Extremity/Trunk Assessment Right Upper Extremity Assessment RUE ROM/Strength/Tone: Within functional levels RUE Sensation: WFL - Light Touch RUE Coordination: WFL - gross/fine motor Left Upper Extremity Assessment LUE ROM/Strength/Tone: Within functional levels LUE Sensation: WFL - Light Touch LUE Coordination: WFL - gross/fine motor Trunk Assessment Trunk Assessment: Normal   Mobility Bed Mobility Supine to Sit: 6: Modified independent (Device/Increase time) Sitting - Scoot to Edge of Bed: 7: Independent Transfers Sit to Stand: 6: Modified independent (Device/Increase time) Stand to Sit: 6: Modified independent (Device/Increase time)   Exercise    Balance Balance Balance Assessed: Yes Standardized Balance Assessment Standardized Balance Assessment: Dynamic Gait Index Dynamic Gait Index Level Surface: Normal Change in Gait Speed: Normal Gait with Horizontal Head Turns: Normal Gait with Vertical Head Turns: Normal Gait and Pivot Turn: Normal Step Over Obstacle: Normal Step Around Obstacles: Normal High Level Balance High Level Balance Activites: Side stepping;Backward walking;Sudden stops;Turns  End of Session OT - End of Session Activity Tolerance: Patient tolerated treatment well Patient left: in chair;with call bell/phone within reach  GO      Lucile Shutters 03/20/2012, 3:12 PM Pager: [phone]

## 2012-03-21 MED ORDER — OXYCODONE HCL 5 MG PO TABS: 5.0000 mg | ORAL_TABLET | Freq: Four times a day (QID) | ORAL | Status: AC | PRN

## 2012-03-21 NOTE — Progress Notes (Signed)
[facility] Progress Note  03/21/2012 7:22 AM POD 2  Subjective:  No complaints other than sore throat and states "I'm having trouble with my sinuses".  Tm 101.2 now 99.3   Otherwise VSS  92%RA Filed Vitals:   03/21/12 0433  BP: 100/58  Pulse: 66  Temp: 99.3 F (37.4 C)  Resp: 18    Physical Exam: Incisions:  C/d/i with minimal erythema and a couple of small tape burns. Extremities:  + palpable right DP pulse. Lungs:  CTAB  CBC    Component Value Date/Time   WBC 8.4 03/20/2012 0613   RBC 3.95* 03/20/2012 0613   HGB 11.8* 03/20/2012 0613   HCT 35.2* 03/20/2012 0613   PLT 188 03/20/2012 0613   MCV 89.1 03/20/2012 0613   MCH 29.9 03/20/2012 0613   MCHC 33.5 03/20/2012 0613   RDW 13.5 03/20/2012 0613    BMET    Component Value Date/Time   NA 134* 03/20/2012 0613   K 3.9 03/20/2012 0613   CL 103 03/20/2012 0613   CO2 23 03/20/2012 0613   GLUCOSE 102* 03/20/2012 0613   GLUCOSE 100* 05/05/2006 0839   BUN 14 03/20/2012 0613   CREATININE 0.93 03/20/2012 0613   CREATININE 1.00 02/20/2012 1452   CALCIUM 8.9 03/20/2012 0613   GFRNONAA 77* 03/20/2012 0613   GFRAA 89* 03/20/2012 0613    INR    Component Value Date/Time   INR 1.14 03/15/2012 0832     Intake/Output Summary (Last 24 hours) at 03/21/12 0722 Last data filed at 03/21/12 0500  Gross per 24 hour  Intake    910 ml  Output   2075 ml  Net  -1165 ml     Assessment/Plan:  78 y.o. 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  May have sinusitis as pt states he feels he is having problems with this as he has in the past.  Incision looks good. -possibly home or keep one more day to observe fever.  Will d/w Dr. Arbie Cookey. -continue mobilization.  -pt has been working with Nature conservation officer.   Doreatha Massed, PA-C [facility] [phone] 03/21/2012 7:22 AM

## 2012-03-22 ENCOUNTER — Telehealth: Payer: Self-pay

## 2012-03-22 DIAGNOSIS — T8140XA Infection following a procedure, unspecified, initial encounter: Secondary | ICD-10-CM

## 2012-03-22 MED ORDER — CEPHALEXIN 500 MG PO CAPS: ORAL_CAPSULE | ORAL | Status: DC

## 2012-03-22 NOTE — Telephone Encounter (Signed)
Phone call from pt. At 4:45 pm.  States he was discharged from hospital yesterday and noticed a redness in right inner thigh, and a purple discoloration posterior thigh.  States there is a soreness in area of redness.  Denies fever or chills.  States incision in right groin is intact/ no drainage.  Denies any swelling of right thigh, or lower leg.  Discussed with Dr. Darrick Penna.  Recommends Keflex 500 mg., Take 1 cap po, tid x 10 days.  Pt. Instructed about medication to be called to pharmacy, and advised to call office is symptoms worsen or don't improve.  Verb. Understanding.

## 2012-04-02 ENCOUNTER — Encounter: Payer: Self-pay | Admitting: Vascular Surgery

## 2012-04-03 ENCOUNTER — Ambulatory Visit (INDEPENDENT_AMBULATORY_CARE_PROVIDER_SITE_OTHER): Payer: Medicare Other | Admitting: Vascular Surgery

## 2012-04-03 ENCOUNTER — Encounter: Payer: Self-pay | Admitting: Vascular Surgery

## 2012-04-03 VITALS — BP 112/74 | HR 66 | Resp 16 | Ht 72.0 in | Wt 195.0 lb

## 2012-04-03 DIAGNOSIS — I724 Aneurysm of artery of lower extremity: Secondary | ICD-10-CM

## 2012-04-03 NOTE — Progress Notes (Signed)
The patient has today for followup of his resection of the large false aneurysm in his right groin. He had an extension from his old limb of his aortofemoral graft down to the superficial femoral artery and reimplantation of his profundus femoris artery. He did well the hospital and was discharged without incident. He is continuing to do well at home. He does have some erythema over his thigh which appears to be more related to Betadine prep. He has a normal femoral pulse and normal popliteal pulses bilaterally. His incision specifically he has healed completely.  Impression and plan successful treatment a large false aneurysm in his right groin. 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 We discussed timing of treatment of this. 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 We have scheduled him for October 14 admission day surgery for repair of left common femoral artery aneurysm

## 2012-04-05 ENCOUNTER — Other Ambulatory Visit: Payer: Self-pay | Admitting: Cardiology

## 2012-04-05 MED ORDER — HYDROCHLOROTHIAZIDE 12.5 MG PO TABS: 12.5000 mg | ORAL_TABLET | Freq: Every day | ORAL | Status: DC

## 2012-04-16 ENCOUNTER — Other Ambulatory Visit: Payer: Self-pay | Admitting: Cardiovascular Disease

## 2012-04-16 MED ORDER — LOSARTAN POTASSIUM 50 MG PO TABS: 50.0000 mg | ORAL_TABLET | Freq: Every day | ORAL | Status: DC

## 2012-04-16 NOTE — Telephone Encounter (Signed)
[facility] on [address]

## 2012-04-18 ENCOUNTER — Other Ambulatory Visit: Payer: Self-pay | Admitting: *Deleted

## 2012-04-18 MED ORDER — FENOFIBRATE MICRONIZED 200 MG PO CAPS: 200.0000 mg | ORAL_CAPSULE | Freq: Every day | ORAL | Status: DC

## 2012-04-20 ENCOUNTER — Other Ambulatory Visit: Payer: Self-pay

## 2012-04-24 ENCOUNTER — Encounter (HOSPITAL_COMMUNITY): Payer: Self-pay | Admitting: Pharmacy Technician

## 2012-04-27 ENCOUNTER — Encounter (HOSPITAL_COMMUNITY): Payer: Self-pay

## 2012-04-27 ENCOUNTER — Encounter (HOSPITAL_COMMUNITY)
Admission: RE | Admit: 2012-04-27 | Discharge: 2012-04-27 | Disposition: A | Discharge status: Home / Self Care | Payer: Medicare Other | Source: Ambulatory Visit | Attending: Vascular Surgery | Admitting: Vascular Surgery

## 2012-04-27 LAB — COMPREHENSIVE METABOLIC PANEL
ALT: 13 U/L (ref 0–53)
AST: 14 U/L (ref 0–37)
Albumin: 4.1 g/dL (ref 3.5–5.2)
Alkaline Phosphatase: 45 U/L (ref 39–117)
BUN: 21 mg/dL (ref 6–23)
CO2: 27 mEq/L (ref 19–32)
Calcium: 10.2 mg/dL (ref 8.4–10.5)
Chloride: 103 mEq/L (ref 96–112)
Creatinine, Ser: 1.01 mg/dL (ref 0.50–1.35)
GFR calc Af Amer: 78 mL/min — ABNORMAL LOW (ref >90)
GFR calc non Af Amer: 68 mL/min — ABNORMAL LOW (ref >90)
Glucose, Bld: 127 mg/dL — ABNORMAL HIGH (ref 70–99)
Potassium: 3.6 mEq/L (ref 3.5–5.1)
Sodium: 139 mEq/L (ref 135–145)
Total Bilirubin: 0.6 mg/dL (ref 0.3–1.2)
Total Protein: 6.5 g/dL (ref 6.0–8.3)

## 2012-04-27 LAB — SURGICAL PCR SCREEN
MRSA, PCR: NEGATIVE
Staphylococcus aureus: NEGATIVE

## 2012-04-27 LAB — ABO/RH: ABO/RH(D): A POS

## 2012-04-27 LAB — CBC
HCT: 39.4 % (ref 39.0–52.0)
MCHC: 33.5 g/dL (ref 30.0–36.0)
MCV: 89.7 fL (ref 78.0–100.0)
RDW: 13.7 % (ref 11.5–15.5)

## 2012-04-27 LAB — TYPE AND SCREEN
ABO/RH(D): A POS
Antibody Screen: NEGATIVE

## 2012-04-27 LAB — URINALYSIS, ROUTINE W REFLEX MICROSCOPIC
Leukocytes, UA: NEGATIVE
Protein, ur: NEGATIVE mg/dL
Urobilinogen, UA: 1 mg/dL (ref 0.0–1.0)

## 2012-04-27 LAB — APTT: aPTT: 36 seconds (ref 24–37)

## 2012-04-27 NOTE — Pre-Procedure Instructions (Signed)
20 Jesse [name]   04/27/2012   Your procedure is scheduled on:  Monday October 14  Report to [facility] at 7:30 AM.  Call this number if you have problems the morning of surgery: [phone]   Remember:   Do not eat or drink:After Midnight.    Take these medicines the morning of surgery with A SIP OF WATER: Carvedilol (Coreg), loratadine (Claritin).   Do not wear jewelry, make-up or nail polish.  Do not wear lotions, powders, or perfumes. You may wear deodorant.  Do not shave 48 hours prior to surgery. Men may shave face and neck.  Do not bring valuables to the hospital.  Contacts, dentures or bridgework may not be worn into surgery.  Leave suitcase in the car. After surgery it may be brought to your room.  For patients admitted to the hospital, checkout time is 11:00 AM the day of discharge.   Patients discharged the day of surgery will not be allowed to drive home.  Name and phone number of your driver: NA  Special Instructions: Shower using CHG 2 nights before surgery and the night before surgery.  If you shower the day of surgery use CHG.  Use special wash - you have one bottle of CHG for all showers.  You should use approximately 1/3 of the bottle for each shower.   Please read over the following fact sheets that you were given: Pain Booklet, Coughing and Deep Breathing, Blood Transfusion Information and Surgical Site Infection Prevention

## 2012-05-01 NOTE — Consult Note (Signed)
Anesthesia Chart Review:  Patient is a 78 year old male posted for repair of left femoral artery aneurysm on 05/07/12 by Dr. Arbie Cookey.  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  Other history includes former smoker, CAD/MI s/p CABG '92, HLD, HTN, AAA repair '95, PNA, prostate CA.  His Cardiologist is Dr. Excell Seltzer.  He last saw patient on 03/06/12 for clearance before his right CFA aneurysm repair in August 2013.  EKG then showed SR with occasional PVCs, LAD, left BBB, with no new acute changes.  According to Dr. Earmon Phoenix note, "The patient is stable without anginal symptoms. He had a low risk stress nuclear study within the last 12 months. He is at moderately increased cardiac risk with his upcoming vascular surgery because of advanced age and remote coronary bypass surgery. However, his risk is certainly not prohibitive and considering his asymptomatic status I would recommend proceeding with surgery without further testing at this time. He is on a stable medical program and his perioperative beta-blockade should be continued without interruption. I would be happy to see him if there are any problems in the postoperative period."  The patient had a nuclear stress test 05/25/2011. This demonstrated a left ventricular ejection fraction of 50%. There was mildly decreased uptake in the inferior and inferolateral walls consistent with a small previous infarction. Trivial peri-infarct ischemia was noted. The study was low risk.   Echo on 04/26/06 showed: - There is mild decreased thickening of the posterior wall at the base. There is mild septal dyssynergy that could be related to IVCD and/or prior CABG. Overall left ventricular systolic function was mildly decreased. Left ventricular ejection fraction was estimated to be 55 %. - Aortic valve thickness was mildly increased. - The left atrium was mildly dilated.  CXR on 03/15/12 showed emphysematous changes with left basilar scarring. Post CABG.   Labs  noted.  He tolerated recent vascular procedure in August 2013.  Anticipate he can proceed as planned if no new CV symptoms.  Shonna Chock, PA-C

## 2012-05-06 MED ORDER — DEXTROSE 5 % IV SOLN
1.5000 g | INTRAVENOUS | Status: AC
  Administered 2012-05-07: 1.5 g via INTRAVENOUS
  Filled 2012-05-06: qty 1.5

## 2012-05-06 MED ORDER — SODIUM CHLORIDE 0.9 % IV SOLN: INTRAVENOUS | Status: DC

## 2012-05-07 ENCOUNTER — Encounter (HOSPITAL_COMMUNITY)
Admission: RE | Disposition: A | Discharge status: Home / Self Care | Payer: Self-pay | Source: Ambulatory Visit | Attending: Vascular Surgery

## 2012-05-07 ENCOUNTER — Ambulatory Visit (HOSPITAL_COMMUNITY): Payer: Medicare Other | Admitting: Vascular Surgery

## 2012-05-07 ENCOUNTER — Encounter (HOSPITAL_COMMUNITY): Payer: Self-pay | Admitting: Vascular Surgery

## 2012-05-07 ENCOUNTER — Encounter (HOSPITAL_COMMUNITY): Payer: Self-pay | Admitting: *Deleted

## 2012-05-07 ENCOUNTER — Inpatient Hospital Stay (HOSPITAL_COMMUNITY)
Admission: RE | Admit: 2012-05-07 | Discharge: 2012-05-09 | DRG: 254 | Disposition: A | Discharge status: Home / Self Care | Payer: Medicare Other | Source: Ambulatory Visit | Attending: Vascular Surgery | Admitting: Vascular Surgery

## 2012-05-07 ENCOUNTER — Telehealth: Payer: Self-pay | Admitting: Vascular Surgery

## 2012-05-07 DIAGNOSIS — E785 Hyperlipidemia, unspecified: Secondary | ICD-10-CM | POA: Diagnosis present

## 2012-05-07 DIAGNOSIS — I724 Aneurysm of artery of lower extremity: Secondary | ICD-10-CM

## 2012-05-07 DIAGNOSIS — I10 Essential (primary) hypertension: Secondary | ICD-10-CM | POA: Diagnosis present

## 2012-05-07 DIAGNOSIS — I739 Peripheral vascular disease, unspecified: Secondary | ICD-10-CM | POA: Diagnosis present

## 2012-05-07 DIAGNOSIS — I2510 Atherosclerotic heart disease of native coronary artery without angina pectoris: Secondary | ICD-10-CM | POA: Diagnosis present

## 2012-05-07 DIAGNOSIS — N289 Disorder of kidney and ureter, unspecified: Secondary | ICD-10-CM | POA: Diagnosis present

## 2012-05-07 DIAGNOSIS — Z79899 Other long term (current) drug therapy: Secondary | ICD-10-CM

## 2012-05-07 DIAGNOSIS — Z8546 Personal history of malignant neoplasm of prostate: Secondary | ICD-10-CM

## 2012-05-07 DIAGNOSIS — Z87891 Personal history of nicotine dependence: Secondary | ICD-10-CM

## 2012-05-07 DIAGNOSIS — M129 Arthropathy, unspecified: Secondary | ICD-10-CM | POA: Diagnosis present

## 2012-05-07 DIAGNOSIS — Z01812 Encounter for preprocedural laboratory examination: Secondary | ICD-10-CM

## 2012-05-07 DIAGNOSIS — I252 Old myocardial infarction: Secondary | ICD-10-CM

## 2012-05-07 HISTORY — PX: FALSE ANEURYSM REPAIR: SHX5152

## 2012-05-07 SURGERY — REPAIR, PSEUDOANEURYSM
Anesthesia: General | Site: Thigh | Laterality: Left | Wound class: Clean

## 2012-05-07 MED ORDER — DOPAMINE-DEXTROSE 3.2-5 MG/ML-% IV SOLN: 3.0000 ug/kg/min | INTRAVENOUS | Status: DC

## 2012-05-07 MED ORDER — FENOFIBRATE 54 MG PO TABS
54.0000 mg | ORAL_TABLET | Freq: Every day | ORAL | Status: DC
  Administered 2012-05-08 – 2012-05-09 (×2): 54 mg via ORAL
  Filled 2012-05-07 (×3): qty 1

## 2012-05-07 MED ORDER — LABETALOL HCL 5 MG/ML IV SOLN
10.0000 mg | INTRAVENOUS | Status: DC | PRN
  Filled 2012-05-07: qty 4

## 2012-05-07 MED ORDER — POTASSIUM CHLORIDE CRYS ER 20 MEQ PO TBCR: 20.0000 meq | EXTENDED_RELEASE_TABLET | Freq: Once | ORAL | Status: AC | PRN

## 2012-05-07 MED ORDER — LACTATED RINGERS IV SOLN
INTRAVENOUS | Status: DC | PRN
  Administered 2012-05-07 (×2): via INTRAVENOUS

## 2012-05-07 MED ORDER — DEXTROSE 5 % IV SOLN
1.5000 g | Freq: Two times a day (BID) | INTRAVENOUS | Status: AC
  Administered 2012-05-07 – 2012-05-08 (×2): 1.5 g via INTRAVENOUS
  Filled 2012-05-07 (×2): qty 1.5

## 2012-05-07 MED ORDER — DEXTROSE 5 % IV SOLN
1.5000 g | Freq: Two times a day (BID) | INTRAVENOUS | Status: DC
  Filled 2012-05-07: qty 1.5

## 2012-05-07 MED ORDER — TEMAZEPAM 15 MG PO CAPS
15.0000 mg | ORAL_CAPSULE | Freq: Every evening | ORAL | Status: DC | PRN
  Administered 2012-05-09: 15 mg via ORAL
  Filled 2012-05-07: qty 1

## 2012-05-07 MED ORDER — LORATADINE 10 MG PO TABS
10.0000 mg | ORAL_TABLET | Freq: Every day | ORAL | Status: DC
  Administered 2012-05-08 – 2012-05-09 (×2): 10 mg via ORAL
  Filled 2012-05-07 (×2): qty 1

## 2012-05-07 MED ORDER — LIDOCAINE HCL (CARDIAC) 20 MG/ML IV SOLN
INTRAVENOUS | Status: DC | PRN
  Administered 2012-05-07: 70 mg via INTRAVENOUS

## 2012-05-07 MED ORDER — ARTIFICIAL TEARS OP OINT
TOPICAL_OINTMENT | OPHTHALMIC | Status: DC | PRN
  Administered 2012-05-07: 1 via OPHTHALMIC

## 2012-05-07 MED ORDER — HYDROCHLOROTHIAZIDE 12.5 MG PO CAPS
12.5000 mg | ORAL_CAPSULE | Freq: Every day | ORAL | Status: DC
  Administered 2012-05-08 – 2012-05-09 (×2): 12.5 mg via ORAL
  Filled 2012-05-07 (×3): qty 1

## 2012-05-07 MED ORDER — SODIUM CHLORIDE 0.9 % IV SOLN
INTRAVENOUS | Status: DC
  Administered 2012-05-07 – 2012-05-08 (×2): via INTRAVENOUS

## 2012-05-07 MED ORDER — MORPHINE SULFATE 2 MG/ML IJ SOLN
INTRAMUSCULAR | Status: AC
  Administered 2012-05-07: 2 mg via INTRAVENOUS
  Filled 2012-05-07: qty 1

## 2012-05-07 MED ORDER — OXYCODONE HCL 5 MG PO TABS
5.0000 mg | ORAL_TABLET | ORAL | Status: DC | PRN
  Administered 2012-05-07: 5 mg via ORAL
  Administered 2012-05-08: 10 mg via ORAL
  Administered 2012-05-09 (×3): 5 mg via ORAL
  Filled 2012-05-07: qty 1
  Filled 2012-05-07: qty 2
  Filled 2012-05-07 (×3): qty 1

## 2012-05-07 MED ORDER — HYDROMORPHONE HCL PF 1 MG/ML IJ SOLN: 0.2500 mg | INTRAMUSCULAR | Status: DC | PRN

## 2012-05-07 MED ORDER — HYDRALAZINE HCL 20 MG/ML IJ SOLN
10.0000 mg | INTRAMUSCULAR | Status: DC | PRN
  Filled 2012-05-07: qty 0.5

## 2012-05-07 MED ORDER — DEXTROSE 5 % IV SOLN
INTRAVENOUS | Status: DC | PRN
  Administered 2012-05-07: 10:00:00 via INTRAVENOUS

## 2012-05-07 MED ORDER — PHENYLEPHRINE HCL 10 MG/ML IJ SOLN
20.0000 mg | INTRAVENOUS | Status: DC | PRN
  Administered 2012-05-07: 25 ug/min via INTRAVENOUS

## 2012-05-07 MED ORDER — SODIUM CHLORIDE 0.9 % IR SOLN
Status: DC | PRN
  Administered 2012-05-07: 11:00:00

## 2012-05-07 MED ORDER — CARVEDILOL 12.5 MG PO TABS
12.5000 mg | ORAL_TABLET | Freq: Two times a day (BID) | ORAL | Status: DC
  Administered 2012-05-08: 12.5 mg via ORAL
  Filled 2012-05-07 (×6): qty 1

## 2012-05-07 MED ORDER — ACETAMINOPHEN 650 MG RE SUPP: 325.0000 mg | RECTAL | Status: DC | PRN

## 2012-05-07 MED ORDER — MORPHINE SULFATE 2 MG/ML IJ SOLN
2.0000 mg | INTRAMUSCULAR | Status: DC | PRN
  Administered 2012-05-07 (×3): 2 mg via INTRAVENOUS
  Filled 2012-05-07 (×5): qty 1

## 2012-05-07 MED ORDER — SODIUM CHLORIDE 0.9 % IV SOLN: 500.0000 mL | Freq: Once | INTRAVENOUS | Status: AC | PRN

## 2012-05-07 MED ORDER — ALUM & MAG HYDROXIDE-SIMETH 200-200-20 MG/5ML PO SUSP: 15.0000 mL | ORAL | Status: DC | PRN

## 2012-05-07 MED ORDER — ROCURONIUM BROMIDE 100 MG/10ML IV SOLN
INTRAVENOUS | Status: DC | PRN
  Administered 2012-05-07: 50 mg via INTRAVENOUS
  Administered 2012-05-07: 20 mg via INTRAVENOUS

## 2012-05-07 MED ORDER — LACTATED RINGERS IV SOLN
INTRAVENOUS | Status: DC
  Administered 2012-05-07: 09:00:00 via INTRAVENOUS

## 2012-05-07 MED ORDER — NEOSTIGMINE METHYLSULFATE 1 MG/ML IJ SOLN
INTRAMUSCULAR | Status: DC | PRN
  Administered 2012-05-07: 4 mg via INTRAVENOUS

## 2012-05-07 MED ORDER — OXYCODONE HCL 5 MG PO TABS: 5.0000 mg | ORAL_TABLET | Freq: Four times a day (QID) | ORAL | Status: DC | PRN

## 2012-05-07 MED ORDER — DOCUSATE SODIUM 100 MG PO CAPS
100.0000 mg | ORAL_CAPSULE | Freq: Every day | ORAL | Status: DC
  Administered 2012-05-08 – 2012-05-09 (×2): 100 mg via ORAL
  Filled 2012-05-07 (×2): qty 1

## 2012-05-07 MED ORDER — ACETAMINOPHEN 500 MG PO TABS
500.0000 mg | ORAL_TABLET | Freq: Four times a day (QID) | ORAL | Status: DC | PRN
  Administered 2012-05-08: 500 mg via ORAL
  Filled 2012-05-07 (×2): qty 1

## 2012-05-07 MED ORDER — PHENOL 1.4 % MT LIQD: 1.0000 | OROMUCOSAL | Status: DC | PRN

## 2012-05-07 MED ORDER — METOPROLOL TARTRATE 1 MG/ML IV SOLN
2.0000 mg | INTRAVENOUS | Status: DC | PRN
  Administered 2012-05-08: 2.5 mg via INTRAVENOUS
  Filled 2012-05-07: qty 5

## 2012-05-07 MED ORDER — PANTOPRAZOLE SODIUM 40 MG PO TBEC
40.0000 mg | DELAYED_RELEASE_TABLET | Freq: Every day | ORAL | Status: DC
  Administered 2012-05-07 – 2012-05-08 (×2): 40 mg via ORAL
  Filled 2012-05-07 (×2): qty 1

## 2012-05-07 MED ORDER — SIMVASTATIN 40 MG PO TABS
40.0000 mg | ORAL_TABLET | Freq: Every day | ORAL | Status: DC
  Administered 2012-05-07 – 2012-05-08 (×2): 40 mg via ORAL
  Filled 2012-05-07 (×4): qty 1

## 2012-05-07 MED ORDER — HYDROCHLOROTHIAZIDE 25 MG PO TABS
12.5000 mg | ORAL_TABLET | Freq: Every day | ORAL | Status: DC
  Filled 2012-05-07: qty 0.5

## 2012-05-07 MED ORDER — ONDANSETRON HCL 4 MG/2ML IJ SOLN
INTRAMUSCULAR | Status: DC | PRN
  Administered 2012-05-07: 4 mg via INTRAVENOUS

## 2012-05-07 MED ORDER — PROTAMINE SULFATE 10 MG/ML IV SOLN
INTRAVENOUS | Status: DC | PRN
  Administered 2012-05-07: 50 mg via INTRAVENOUS

## 2012-05-07 MED ORDER — HEPARIN SODIUM (PORCINE) 1000 UNIT/ML IJ SOLN
INTRAMUSCULAR | Status: DC | PRN
  Administered 2012-05-07: 9000 [IU] via INTRAVENOUS

## 2012-05-07 MED ORDER — SUFENTANIL CITRATE 50 MCG/ML IV SOLN
INTRAVENOUS | Status: DC | PRN
  Administered 2012-05-07 (×2): 10 ug via INTRAVENOUS
  Administered 2012-05-07: 15 ug via INTRAVENOUS

## 2012-05-07 MED ORDER — PROPOFOL 10 MG/ML IV BOLUS
INTRAVENOUS | Status: DC | PRN
  Administered 2012-05-07: 150 mg via INTRAVENOUS

## 2012-05-07 MED ORDER — LOSARTAN POTASSIUM 50 MG PO TABS
50.0000 mg | ORAL_TABLET | Freq: Every day | ORAL | Status: DC
  Administered 2012-05-08: 50 mg via ORAL
  Filled 2012-05-07 (×3): qty 1

## 2012-05-07 MED ORDER — LIDOCAINE HCL 4 % MT SOLN
OROMUCOSAL | Status: DC | PRN
  Administered 2012-05-07: 4 mL via TOPICAL

## 2012-05-07 MED ORDER — ONDANSETRON HCL 4 MG/2ML IJ SOLN: 4.0000 mg | Freq: Four times a day (QID) | INTRAMUSCULAR | Status: DC | PRN

## 2012-05-07 MED ORDER — MAGNESIUM SULFATE 40 MG/ML IJ SOLN
2.0000 g | Freq: Once | INTRAMUSCULAR | Status: AC | PRN
  Filled 2012-05-07: qty 50

## 2012-05-07 MED ORDER — OXYCODONE HCL 5 MG/5ML PO SOLN: 5.0000 mg | Freq: Once | ORAL | Status: DC | PRN

## 2012-05-07 MED ORDER — OXYCODONE HCL 5 MG PO TABS: 5.0000 mg | ORAL_TABLET | Freq: Once | ORAL | Status: DC | PRN

## 2012-05-07 MED ORDER — GLYCOPYRROLATE 0.2 MG/ML IJ SOLN
INTRAMUSCULAR | Status: DC | PRN
  Administered 2012-05-07: .6 mg via INTRAVENOUS

## 2012-05-07 MED ORDER — GUAIFENESIN-DM 100-10 MG/5ML PO SYRP: 15.0000 mL | ORAL_SOLUTION | ORAL | Status: DC | PRN

## 2012-05-07 MED ORDER — 0.9 % SODIUM CHLORIDE (POUR BTL) OPTIME
TOPICAL | Status: DC | PRN
  Administered 2012-05-07: 2000 mL

## 2012-05-07 SURGICAL SUPPLY — 55 items
BANDAGE ESMARK 6X9 LF (GAUZE/BANDAGES/DRESSINGS) IMPLANT
BENZOIN TINCTURE PRP APPL 2/3 (GAUZE/BANDAGES/DRESSINGS) ×2 IMPLANT
BNDG ESMARK 6X9 LF (GAUZE/BANDAGES/DRESSINGS)
CANISTER SUCTION 2500CC (MISCELLANEOUS) ×2 IMPLANT
CLIP LIGATING EXTRA MED SLVR (CLIP) ×2 IMPLANT
CLIP LIGATING EXTRA SM BLUE (MISCELLANEOUS) ×2 IMPLANT
CLOSURE STERI-STRIP 1/4X4 (GAUZE/BANDAGES/DRESSINGS) ×2 IMPLANT
CLOTH BEACON ORANGE TIMEOUT ST (SAFETY) ×2 IMPLANT
COVER SURGICAL LIGHT HANDLE (MISCELLANEOUS) ×2 IMPLANT
CUFF TOURNIQUET SINGLE 18IN (TOURNIQUET CUFF) IMPLANT
CUFF TOURNIQUET SINGLE 24IN (TOURNIQUET CUFF) IMPLANT
CUFF TOURNIQUET SINGLE 34IN LL (TOURNIQUET CUFF) IMPLANT
CUFF TOURNIQUET SINGLE 44IN (TOURNIQUET CUFF) IMPLANT
DRAIN SNY 10X20 3/4 PERF (WOUND CARE) IMPLANT
DRAPE WARM FLUID 44X44 (DRAPE) ×2 IMPLANT
DRAPE X-RAY CASS 24X20 (DRAPES) IMPLANT
DRSG COVADERM 4X6 (GAUZE/BANDAGES/DRESSINGS) ×2 IMPLANT
DRSG COVADERM 4X8 (GAUZE/BANDAGES/DRESSINGS) IMPLANT
ELECT REM PT RETURN 9FT ADLT (ELECTROSURGICAL) ×2
ELECTRODE REM PT RTRN 9FT ADLT (ELECTROSURGICAL) ×1 IMPLANT
EVACUATOR SILICONE 100CC (DRAIN) IMPLANT
GLOVE BIO SURGEON STRL SZ 6.5 (GLOVE) ×6 IMPLANT
GLOVE BIOGEL PI IND STRL 6.5 (GLOVE) ×3 IMPLANT
GLOVE BIOGEL PI INDICATOR 6.5 (GLOVE) ×3
GLOVE SS BIOGEL STRL SZ 6.5 (GLOVE) ×1 IMPLANT
GLOVE SS BIOGEL STRL SZ 7.5 (GLOVE) ×1 IMPLANT
GLOVE SUPERSENSE BIOGEL SZ 6.5 (GLOVE) ×1
GLOVE SUPERSENSE BIOGEL SZ 7.5 (GLOVE) ×1
GLOVE SURG SS PI 6.0 STRL IVOR (GLOVE) ×2 IMPLANT
GLOVE SURG SS PI 6.5 STRL IVOR (GLOVE) ×2 IMPLANT
GOWN STRL NON-REIN LRG LVL3 (GOWN DISPOSABLE) ×8 IMPLANT
GRAFT HEMASHIELD 12MM (Vascular Products) ×1 IMPLANT
GRAFT VASC STRG 30X12KNIT (Vascular Products) ×1 IMPLANT
KIT BASIN OR (CUSTOM PROCEDURE TRAY) ×2 IMPLANT
KIT ROOM TURNOVER OR (KITS) ×2 IMPLANT
NS IRRIG 1000ML POUR BTL (IV SOLUTION) ×4 IMPLANT
PACK PERIPHERAL VASCULAR (CUSTOM PROCEDURE TRAY) ×2 IMPLANT
PAD ARMBOARD 7.5X6 YLW CONV (MISCELLANEOUS) ×4 IMPLANT
PADDING CAST COTTON 6X4 STRL (CAST SUPPLIES) IMPLANT
SET COLLECT BLD 21X3/4 12 (NEEDLE) IMPLANT
STAPLER VISISTAT 35W (STAPLE) IMPLANT
STOPCOCK 4 WAY LG BORE MALE ST (IV SETS) IMPLANT
STRIP CLOSURE SKIN 1/2X4 (GAUZE/BANDAGES/DRESSINGS) ×2 IMPLANT
SUT ETHILON 3 0 PS 1 (SUTURE) IMPLANT
SUT PROLENE 5 0 C 1 24 (SUTURE) ×8 IMPLANT
SUT PROLENE 6 0 CC (SUTURE) ×4 IMPLANT
SUT VIC AB 2-0 CTX 36 (SUTURE) ×2 IMPLANT
SUT VIC AB 3-0 SH 27 (SUTURE) ×1
SUT VIC AB 3-0 SH 27X BRD (SUTURE) ×1 IMPLANT
TOWEL OR 17X24 6PK STRL BLUE (TOWEL DISPOSABLE) ×4 IMPLANT
TOWEL OR 17X26 10 PK STRL BLUE (TOWEL DISPOSABLE) ×2 IMPLANT
TRAY FOLEY CATH 14FRSI W/METER (CATHETERS) ×2 IMPLANT
TUBING EXTENTION W/L.L. (IV SETS) IMPLANT
UNDERPAD 30X30 INCONTINENT (UNDERPADS AND DIAPERS) ×2 IMPLANT
WATER STERILE IRR 1000ML POUR (IV SOLUTION) ×2 IMPLANT

## 2012-05-07 NOTE — H&P (Signed)
Jesse [name]  Description:  78 year old male  03/13/2012 2:00 PM Office Visit Provider:  Kaelani Kendrick, MD  MRN: [id number] Department:  [facility]            Diagnoses  Reason for Visit    Abdominal aneurysm without mention of rupture - Primary  Follow-up     CT angiogram 02-21-2012 [facility]   441.4  AAA           Vitals - Last Recorded       BP  Pulse  Resp  Ht  Wt  BMI    139/74  62  18  6' (1.829 m)  202 lb 3.2 oz (91.717 kg)  27.42 kg/m2             Progress Notes     Jadien Lehigh, MD 03/13/2012 5:45 PM Signed  Patient is here today for a discussion regarding his diffuse peripheral vascular aneurysms. He was seen with an extensive office visit on 02/21/2012. CT scan at that time showed a 5 cm common femoral artery aneurysms bilaterally. I have recommended elective repair and he is been seen by Dr. Excell Seltzer for cardiac clearance which has been obtained. He is a very pleasant and inquisitive and we had an extremely long discussion regarding the etiology of his diffuse arteriomegaly in aneurysms and also suggested treatment.     Past Medical History     Diagnosis  Date     .  Coronary artery disease      .  Hyperlipidemia      .  Hypertension      .  Cancer  2010       prostate (radiation treatments)     .  AAA (abdominal aortic aneurysm)      .  Arthritis          History     Substance Use Topics     .  Smoking status:  Former Smoker       Types:  Cigarettes       Quit date:  07/25/1968     .  Smokeless tobacco:  Never Used     .  Alcohol Use:  No      History reviewed. No pertinent family history.  No Known Allergies  Current outpatient prescriptions:acetaminophen (TYLENOL) 500 MG tablet, Take 500 mg by mouth every 6 (six) hours as needed. , Disp: , Rfl: ; carvedilol (COREG) 12.5 MG tablet, Take 1 tablet (12.5 mg total) by mouth 2 (two) times daily., Disp: 180 tablet, Rfl: 2; fenofibrate micronized (LOFIBRA) 200 MG capsule, TAKE 1  CAPSULE DAILY, Disp: 90 capsule, Rfl: 2  hydrochlorothiazide (HYDRODIURIL) 12.5 MG tablet, TAKE 1 TABLET DAILY, Disp: 90 tablet, Rfl: 2; loratadine (CLARITIN) 10 MG tablet, Take 10 mg by mouth daily. , Disp: , Rfl: ; losartan (COZAAR) 50 MG tablet, TAKE 1 TABLET DAILY, Disp: 90 tablet, Rfl: 2; Polyvinyl Alcohol-Povidone (REFRESH OP), Apply to eye 3 (three) times daily as needed., Disp: , Rfl:  simvastatin (ZOCOR) 40 MG tablet, Take 1 tablet (40 mg total) by mouth at bedtime., Disp: 90 tablet, Rfl: 2  BP 139/74  Pulse 62  Resp 18  Ht 6' (1.829 m)  Wt 202 lb 3.2 oz (91.717 kg)  BMI 27.42 kg/m2  Body mass index is 27.42 kg/(m^2).  Physical exam is unchanged he has a very large nontender femoral artery aneurysms and also prominent popliteal pulse I should this  as well.  Impression and plan 5 cm common femoral artery aneurysms which expanded significantly from 3.8-5 cm in the last year and a half. I have recommended staged elective repair. We will proceed initially with his right common femoral artery aneurysm and then repair his left common femoral artery several weeks after recovery. 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 I explained that in all likelihood he should recover quite nicely in the no limitation by that time      Electronic signature on 03/13/2012          Not recorded                               Level of Service  Follow-up and Disposition    PR OFFICE OUTPATIENT VISIT 15 MINUTES [99213]  Routing History Recorded           All Flowsheet Templates (all recorded)     Encounter Vitals Flowsheet   Custom Formula Data Flowsheet   Anthropometrics Flowsheet                           Referring Provider          Sigmund Hazel, MD            All Charges for This Encounter       Code  Description  Service Date  Service Provider   Modifiers  Quantity    99213  PR OFFICE OUTPATIENT VISIT 15 MINUTES  03/13/2012  Larina Earthly, MD   1                Other Encounter Related Information     Allergies & Medications      Problem List      History      Patient-Entered Questionnaires      Printed AVS Reports       Printed at    Printed by    03/13/2012 3:34 PM  After Visit Summary  Fredrich Birks           No data filed

## 2012-05-07 NOTE — Telephone Encounter (Signed)
Message copied by Fredrich Birks on Mon May 07, 2012  2:04 PM ------      Message from: Sharee Pimple      Created: Mon May 07, 2012 12:34 PM      Regarding: schedule                   ----- Message -----         From: Dara Lords, PA         Sent: 05/07/2012  12:06 PM           To: Sharee Pimple, CMA            S/p left femoral artery aneurysm repair by TFE today            F/u with him in 2 weeks.            Thanks,      Lelon Mast

## 2012-05-07 NOTE — Anesthesia Preprocedure Evaluation (Addendum)
Anesthesia Evaluation  Patient identified by MRN, date of birth, ID band Patient awake    Reviewed: Allergy & Precautions, H&P , NPO status , Patient's Chart, lab work & pertinent test results  Airway Mallampati: II TM Distance: >3 FB Neck ROM: full    Dental  (+) Teeth Intact, Dental Advisory Given, Caps and Implants,    Pulmonary pneumonia -, resolved, former smoker,   About 1 week ago had productive cough.  Lungs bilat. Clear, w/ decrease in bases Pulmonary exam normal       Cardiovascular hypertension, Pt. on medications and Pt. on home beta blockers + CAD, + Past MI, + CABG and + Peripheral Vascular Disease Rhythm:Regular Rate:Normal     Neuro/Psych    GI/Hepatic negative GI ROS, Neg liver ROS,   Endo/Other  negative endocrine ROS  Renal/GU Renal InsufficiencyRenal disease     Musculoskeletal  (+) Arthritis -,   Abdominal   Peds  Hematology negative hematology ROS (+)   Anesthesia Other Findings Upper Molar bridge  Reproductive/Obstetrics                        Anesthesia Physical Anesthesia Plan  ASA: III  Anesthesia Plan: General   Post-op Pain Management:    Induction: Intravenous  Airway Management Planned: LMA  Additional Equipment:   Intra-op Plan:   Post-operative Plan:   Informed Consent: I have reviewed the patients History and Physical, chart, labs and discussed the procedure including the risks, benefits and alternatives for the proposed anesthesia with the patient or authorized representative who has indicated his/her understanding and acceptance.   Dental advisory given  Plan Discussed with: CRNA and Surgeon  Anesthesia Plan Comments:        Anesthesia Quick Evaluation

## 2012-05-07 NOTE — Op Note (Signed)
OPERATIVE REPORT  DATE OF SURGERY: 05/07/2012  PATIENT: Jesse [name], 78 y.o. male MRN: [id number]  DOB: 05/10/1928  PRE-OPERATIVE DIAGNOSIS: Left common femoral artery aneurysm  POST-OPERATIVE DIAGNOSIS:  Same  PROCEDURE: Repair of left common femoral artery aneurysm with a 12 mm Hemashield graft  SURGEON:  Gretta Began, M.D.  PHYSICIAN ASSISTANT: Rhyne  ANESTHESIA:  Gen.  EBL: 200 ml  Total I/O In: 1550 [I.V.:1550] Out: 650 [Urine:650]  BLOOD ADMINISTERED: None  DRAINS: None  SPECIMEN: Femoral aneurysm  COUNTS CORRECT:  YES  PLAN OF CARE: PACU   PATIENT DISPOSITION:  PACU - hemodynamically stable  PROCEDURE DETAILS: The patient was taken the operating room placed supine position where the area the left groin was prepped and draped in usual sterile fashion. An incision was made over the femoral artery pulsation and carried down to isolate the large left femoral false aneurysm. Dissection was continued up under the inguinal ligament and tributary branches were ligated with 2-0 silk ties and divided. The external iliac artery was encircled at this level above the inguinal ligament and was ectatic but not aneurysmal. Dissection was then continued down to isolate the superficial femoral artery which again was ectatic with the patient's and diffuse arteriomegaly. The patient had 2 large branches of the profundus femoris arteries and both of these were isolated with vessel loops. The patient was given 9000 units of intravenous heparin and after adequate circulation time the external iliac artery was occluded with a Hanley clamp. The superficial femoral artery was also occluded with a Hanley clamp. The profundus branches were occluded with a baby profunda clamps. The aneurysm was opened longitudinally and the artery was transected below the proximal clamp at the level of the inguinal ligament. The takeoff of the superficial femoral and profundus femoris arteries were close enough of  these could both be included in the closure with a straight graft. An 12 mm Hemashield graft was brought onto the field. This was sewn end to end to the distal external iliac artery with a running 5-0 Prolene suture. This anastomosis was tested and found to be adequate. Next the graft was cut to appropriate length and was sewn end-to-end to the junction of the superficial femoral and profundus femoris arteries. This was with 5-0 Prolene sutures well. Prior to completion of the anastomosis the usual flushing maneuvers were undertaken. Anastomosis completed and flow is restored to the superficial and profundus femoris arteries. Several additional sutures required at the distal anastomosis for hemostasis. The patient was given 50 mg of protamine to reverse heparin. Wound irrigated with saline. Hemostasis was obtained. The wounds were closed with 2-0 Vicryl in several layers in the subcutaneous tissue. The skin was closed with 3-0 subcuticular Vicryl stitch. Sterile dressing was applied and the patient was taken to the recovery in stable condition   Gretta Began, M.D. 05/07/2012 12:37 PM

## 2012-05-07 NOTE — Telephone Encounter (Signed)
Lvm and sent letter to home address regarding follow up from femoral artery repair, dpm

## 2012-05-07 NOTE — Preoperative (Addendum)
Beta Blockers   Coreg last taken on 6:30 on 05/07/12

## 2012-05-07 NOTE — Anesthesia Procedure Notes (Signed)
Procedure Name: Intubation Date/Time: 05/07/2012 9:41 AM Performed by: Tyrone Nine Pre-anesthesia Checklist: Patient identified, Timeout performed, Emergency Drugs available, Suction available and Patient being monitored Patient Re-evaluated:Patient Re-evaluated prior to inductionOxygen Delivery Method: Circle system utilized Preoxygenation: Pre-oxygenation with 100% oxygen Intubation Type: IV induction Ventilation: Mask ventilation without difficulty Laryngoscope Size: Mac and 3 Grade View: Grade I Tube type: Oral Tube size: 8.0 mm Number of attempts: 1 Airway Equipment and Method: Stylet Placement Confirmation: breath sounds checked- equal and bilateral,  CO2 detector,  positive ETCO2 and ETT inserted through vocal cords under direct vision Secured at: 22 cm Tube secured with: Tape Dental Injury: Teeth and Oropharynx as per pre-operative assessment

## 2012-05-07 NOTE — Interval H&P Note (Signed)
History and Physical Interval Note:  05/07/2012 7:39 AM  Jesse [name]  has presented today for surgery, with the diagnosis of Left Femoral Artery Aneurysm   The various methods of treatment have been discussed with the patient and family. After consideration of risks, benefits and other options for treatment, the patient has consented to  Procedure(s) (LRB) with comments: REPAIR FALSE ANEURYSM (Left) - Repair of Left Femoral Artery Aneurysm as a surgical intervention .  The patient's history has been reviewed, patient examined, no change in status, stable for surgery.  I have reviewed the patient's chart and labs.  Questions were answered to the patient's satisfaction.   The patient had an uneventful repair of his right femoral aneurysm proximal and one month ago and now returns for elective repair of his left femoral aneurysm.  [name]

## 2012-05-07 NOTE — H&P (View-Only) (Signed)
Anesthesia Chart Review:  Patient is a 78 year old male posted for repair of left femoral artery aneurysm on 05/07/12 by Dr. Early.  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  Other history includes former smoker, CAD/MI s/p CABG '92, HLD, HTN, AAA repair '95, PNA, prostate CA.  His Cardiologist is Dr. Cooper.  He last saw patient on 03/06/12 for clearance before his right CFA aneurysm repair in August 2013.  EKG then showed SR with occasional PVCs, LAD, left BBB, with no new acute changes.  According to Dr. Cooper's note, "The patient is stable without anginal symptoms. He had a low risk stress nuclear study within the last 12 months. He is at moderately increased cardiac risk with his upcoming vascular surgery because of advanced age and remote coronary bypass surgery. However, his risk is certainly not prohibitive and considering his asymptomatic status I would recommend proceeding with surgery without further testing at this time. He is on a stable medical program and his perioperative beta-blockade should be continued without interruption. I would be happy to see him if there are any problems in the postoperative period."  The patient had a nuclear stress test 05/25/2011. This demonstrated a left ventricular ejection fraction of 50%. There was mildly decreased uptake in the inferior and inferolateral walls consistent with a small previous infarction. Trivial peri-infarct ischemia was noted. The study was low risk.   Echo on 04/26/06 showed: - There is mild decreased thickening of the posterior wall at the base. There is mild septal dyssynergy that could be related to IVCD and/or prior CABG. Overall left ventricular systolic function was mildly decreased. Left ventricular ejection fraction was estimated to be 55 %. - Aortic valve thickness was mildly increased. - The left atrium was mildly dilated.  CXR on 03/15/12 showed emphysematous changes with left basilar scarring. Post CABG.   Labs  noted.  He tolerated recent vascular procedure in August 2013.  Anticipate he can proceed as planned if no new CV symptoms.  Johnesha Acheampong, PA-C  

## 2012-05-07 NOTE — Transfer of Care (Signed)
Immediate Anesthesia Transfer of Care Note  Patient: Jesse [name]  Procedure(s) Performed: Procedure(s) (LRB) with comments: REPAIR FALSE ANEURYSM (Left) - Repair of Left Femoral Artery Aneurysm  Patient Location: PACU  Anesthesia Type: General  Level of Consciousness: awake, alert , oriented and patient cooperative  Airway & Oxygen Therapy: Patient Spontanous Breathing  Post-op Assessment: Report given to PACU RN and Post -op Vital signs reviewed and stable  Post vital signs: Reviewed and stable  Complications: No apparent anesthesia complications

## 2012-05-07 NOTE — Progress Notes (Signed)
Pt admitted from PACU, oriented to unit and routines, pain management and safety discussed, pt and son verbalize understanding

## 2012-05-08 ENCOUNTER — Other Ambulatory Visit: Payer: Self-pay

## 2012-05-08 LAB — BASIC METABOLIC PANEL
CO2: 24 mEq/L (ref 19–32)
Chloride: 106 mEq/L (ref 96–112)
Glucose, Bld: 95 mg/dL (ref 70–99)
Potassium: 4.1 mEq/L (ref 3.5–5.1)
Sodium: 136 mEq/L (ref 135–145)

## 2012-05-08 LAB — CBC
Hemoglobin: 11.3 g/dL — ABNORMAL LOW (ref 13.0–17.0)
MCH: 30.1 pg (ref 26.0–34.0)
Platelets: 205 10*3/uL (ref 150–400)
RBC: 3.75 MIL/uL — ABNORMAL LOW (ref 4.22–5.81)
WBC: 7.9 10*3/uL (ref 4.0–10.5)

## 2012-05-08 MED ORDER — INFLUENZA VIRUS VACC SPLIT PF IM SUSP
0.5000 mL | Freq: Once | INTRAMUSCULAR | Status: AC
  Administered 2012-05-08: 0.5 mL via INTRAMUSCULAR

## 2012-05-08 NOTE — Evaluation (Signed)
Occupational Therapy Evaluation Patient Details Name: Jesse [name] MRN: [id number] DOB: [birth year] Today's Date: 05/08/2012 Time: 4098-1191 OT Time Calculation (min): 32 min  OT Assessment / Plan / Recommendation Clinical Impression  78 y/o male admitted for left femoral aneurysm repair. Pt presents with ADL and mobility deficits mainly related to pain. Educated pt on AE for LB ADLs, but not anticipate pain will restrict movement for extended period of time. Will continue to see pt acutely to ensure safety upon return home.    OT Assessment  Patient needs continued OT Services    Follow Up Recommendations  No OT follow up;Supervision - Intermittent    Barriers to Discharge Decreased caregiver support son lives with pt but works throughout the day  Equipment Recommendations  None recommended by OT    Recommendations for Other Services    Frequency  Min 2X/week    Precautions / Restrictions Precautions Precautions: Fall Restrictions Weight Bearing Restrictions: No   Pertinent Vitals/Pain Pt reports left sided pain. Did not rate, but indicated increase with activity. Pt states he is pre-medicated.    ADL       OT Diagnosis: Generalized weakness;Acute pain  OT Problem List: Decreased activity tolerance;Impaired balance (sitting and/or standing);Pain OT Treatment Interventions: Self-care/ADL training;DME and/or AE instruction;Therapeutic activities;Balance training;Patient/family education   OT Goals Acute Rehab OT Goals OT Goal Formulation: With patient Time For Goal Achievement: 05/15/12 Potential to Achieve Goals: Good ADL Goals Pt Will Perform Grooming: Independently;Standing at sink ADL Goal: Grooming - Progress: Goal set today Pt Will Perform Lower Body Dressing: Independently;with modified independence;Sit to stand from bed;Sit to stand from chair ADL Goal: Lower Body Dressing - Progress: Goal set today Pt Will Transfer to Toilet: Independently;Ambulation;Comfort  height toilet ADL Goal: Toilet Transfer - Progress: Goal set today Pt Will Perform Toileting - Hygiene: Independently;Sitting on 3-in-1 or toilet;Sit to stand from 3-in-1/toilet ADL Goal: Toileting - Hygiene - Progress: Goal set today Pt Will Perform Tub/Shower Transfer: Tub transfer;with modified independence;Independently;Ambulation ADL Goal: Tub/Shower Transfer - Progress: Goal set today  Visit Information  Last OT Received On: 05/08/12 Assistance Needed: +1    Subjective Data  Subjective: I feel like this surgery wasn't as bad as the last, except my vision was off when I woke up. It's a little better now. Patient Stated Goal: return home and to music (hobby)   Prior Functioning     Home Living Lives With: Son Available Help at Discharge: Family;Available PRN/intermittently Type of Home: House Home Access: Stairs to enter [employer] of Steps: 3 Entrance Stairs-Rails: Can reach both Home Layout: Two level;Able to live on main level with bedroom/bathroom Bathroom Shower/Tub: Tub/shower unit;Curtain Bathroom Toilet: Standard Home Adaptive Equipment: Grab bars in shower;Straight cane;Shower chair with back Prior Function Level of Independence: Independent Able to Take Stairs?: Yes Driving: Yes 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 He also states he feels as though it has improved over the past day. When reading larger print, pt with no errors, when reading newspaper print, pt with 2 word errors and lost place x 2. Will continue to follow up, but anticipate this will clear on its own.  Cognition   Overall Cognitive Status: Appears within functional limits for tasks assessed/performed Arousal/Alertness: Awake/alert Orientation Level: Appears intact for tasks assessed Behavior During Session: [name] for tasks performed    Extremity/Trunk Assessment Right Upper Extremity Assessment RUE ROM/Strength/Tone: WFL for tasks assessed RUE Sensation: WFL - Light Touch RUE Coordination: WFL - gross/fine motor Left Upper Extremity Assessment LUE ROM/Strength/Tone: WFL for tasks assessed LUE Sensation: WFL - Light Touch LUE Coordination: WFL - gross/fine motor     Mobility Transfers Sit to Stand: 4: Min guard;With armrests;From chair/3-in-1 Stand to Sit: 4: Min guard;With armrests;To chair/3-in-1 Details for Transfer Assistance: pt with incr time/difficulty with sit to stand, most likely related to lt sided pain. Min guard for safety     Shoulder Instructions     Exercise     Balance     End of Session OT - End of Session Equipment Utilized During Treatment: Gait belt Activity Tolerance: Patient tolerated treatment well;Patient limited by pain Patient left: in chair;with call bell/phone within reach Nurse Communication: Mobility status  GO Functional Assessment Tool Used: clinical judgement Functional Limitation: Self care Self Care Current Status (Z6109): At least 1 percent but less than 20 percent impaired, limited or restricted Self Care Goal Status (U0454): 0 percent impaired, limited or restricted   Daneli Butkiewicz 05/08/2012, 11:23 AM

## 2012-05-08 NOTE — Anesthesia Postprocedure Evaluation (Signed)
  Anesthesia Post-op Note  Patient: Jesse [name]  Procedure(s) Performed: Procedure(s) (LRB) with comments: REPAIR FALSE ANEURYSM (Left) - Repair of Left Femoral Artery Aneurysm  Patient Location: PACU and Nursing Unit  Anesthesia Type: General  Level of Consciousness: awake, alert  and oriented  Airway and Oxygen Therapy: Patient Spontanous Breathing  Post-op Pain: none  Post-op Assessment: Post-op Vital signs reviewed, Patient's Cardiovascular Status Stable, Respiratory Function Stable, Patent Airway, No signs of Nausea or vomiting and Pain level controlled  Post-op Vital Signs: Reviewed and stable  Complications: No apparent anesthesia complications

## 2012-05-08 NOTE — Progress Notes (Signed)
Notified that pt has been experiencing frequent PVC's (usually bigeminy). Notified MD on call to inquire about necessary further intervention. Order placed for BMET tomorrow morning. Will continue to monitor pt closely.

## 2012-05-08 NOTE — Progress Notes (Signed)
Pt with new onset Afib HR 110's-140.  Complains of "sinus headache" and "chills."   PA notified, orders received and carried out. VSS, see flowsheet.  Per tele Pt in and out of Afib / NSR with PAC's.  See strips in chart.  12 lead performed as ordered.  Will monitor closely.

## 2012-05-08 NOTE — Evaluation (Signed)
Physical Therapy Evaluation Patient Details Name: Jesse [name] MRN: [id number] DOB: 11-06-1927 Today's Date: 05/08/2012 Time: 4098-1191 PT Time Calculation (min): 28 min  PT Assessment / Plan / Recommendation Clinical Impression  pt s/p resection of femoral aneurysm with grafting.  Mobility limited by mild pain and fatigue.  Should be safe at home with intermittent assist.   No further PT needs.  D/C    PT Assessment  Patent does not need any further PT services    Follow Up Recommendations  No PT follow up    Does the patient have the potential to tolerate intense rehabilitation      Barriers to Discharge        Equipment Recommendations  None recommended by PT    Recommendations for Other Services     Frequency      Precautions / Restrictions Precautions Precautions: Fall Restrictions Weight Bearing Restrictions: No   Pertinent Vitals/Pain       Mobility  Transfers Transfers: Sit to Stand;Stand to Sit Sit to Stand: 5: Supervision;From chair/3-in-1 Stand to Sit: 5: Supervision;To chair/3-in-1 Details for Transfer Assistance: safe technique Ambulation/Gait Ambulation/Gait Assistance: 5: Supervision Ambulation Distance (Feet): 400 Feet Assistive device: None Ambulation/Gait Assistance Details: mildly unsteady at times (scanning), but no LOB Gait Pattern: Within Functional Limits Gait velocity: moderate speed with ability to vary cadence safely Stairs: Yes Stairs Assistance: 5: Supervision Stair Management Technique: Two rails;Alternating pattern;Step to pattern;Forwards Number of Stairs: 5  Wheelchair Mobility Wheelchair Mobility: No    Shoulder Instructions     Exercises     PT Diagnosis:    PT Problem List:   PT Treatment Interventions:     PT Goals    Visit Information  Last PT Received On: 05/08/12 Assistance Needed: +1    Subjective Data      Prior Functioning  Home Living Lives With: Son Available Help at Discharge: Family;Available  PRN/intermittently Type of Home: House Home Access: Stairs to enter [employer] of Steps: 3 Entrance Stairs-Rails: Can reach both Home Layout: Two level;Able to live on main level with bedroom/bathroom Bathroom Shower/Tub: Tub/shower unit;Curtain Bathroom Toilet: Standard Home Adaptive Equipment: Grab bars in shower;Straight cane;Shower chair with back Prior Function Level of Independence: Independent Able to Take Stairs?: Yes Driving: Yes 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 for tasks performed    Extremity/Trunk Assessment Right Upper Extremity Assessment RUE ROM/Strength/Tone: WFL for tasks assessed RUE Sensation: WFL - Light Touch RUE Coordination: WFL - gross/fine motor Left Upper Extremity Assessment LUE ROM/Strength/Tone: WFL for tasks assessed LUE Sensation: WFL - Light Touch LUE Coordination: WFL - gross/fine motor Right Lower Extremity Assessment RLE ROM/Strength/Tone: WFL for tasks assessed Left Lower Extremity Assessment LLE ROM/Strength/Tone: Within functional levels Trunk Assessment Trunk Assessment: Normal   Balance Balance Balance Assessed: Yes Standardized Balance Assessment Standardized Balance Assessment: Dynamic Gait Index Dynamic Gait Index Level Surface: Normal Change in Gait Speed: Mild Impairment Gait with Horizontal Head Turns: Mild Impairment Gait with Vertical Head Turns: Normal Gait and Pivot Turn: Normal Step Over Obstacle: Mild Impairment Step Around Obstacles: Normal Steps: Mild Impairment Total Score: 20  High Level Balance High Level Balance Activites: Backward  walking;Direction changes;Head turns;Sudden stops;Turns (with no LOB)  End of Session  PT - End of Session Activity Tolerance: Patient tolerated treatment well Patient left: in chair;with call bell/phone within reach Nurse Communication: Mobility status  GP     Hadeel Hillebrand, Eliseo Gum 05/08/2012, 2:09 PM  05/08/2012  [place], [place] [phone] (pager)

## 2012-05-08 NOTE — Progress Notes (Signed)
Subjective: Interval History: none.. comfortable up in chair  Objective: Vital signs in last 24 hours: Temp:  [96.8 F (36 C)-99.7 F (37.6 C)] 98.6 F (37 C) (10/15 0300) Pulse Rate:  [46-75] 75  (10/15 0300) Resp:  [1-47] 20  (10/15 0300) BP: (99-136)/(48-78) 103/48 mmHg (10/15 0300) SpO2:  [84 %-100 %] 93 % (10/15 0300) Weight:  [195 lb 1.7 oz (88.5 kg)-195 lb 12.3 oz (88.8 kg)] 195 lb 1.7 oz (88.5 kg) (10/14 1345)  Intake/Output from previous day: 10/14 0701 - 10/15 0700 In: 3003.8 [I.V.:2953.8; IV Piggyback:50] Out: 1875 [Urine:1875] Intake/Output this shift:    Left groin without hematoma dressing intact 3+ popliteal  pulse  Lab Results:  Basename 05/08/12 0430  WBC 7.9  HGB 11.3*  HCT 34.0*  PLT 205   BMET  Basename 05/08/12 0430  NA 136  K 4.1  CL 106  CO2 24  GLUCOSE 95  BUN 17  CREATININE 1.07  CALCIUM 8.5    Studies/Results: No results found. Anti-infectives: Anti-infectives     Start     Dose/Rate Route Frequency Ordered Stop   05/07/12 2200   cefUROXime (ZINACEF) 1.5 g in dextrose 5 % 50 mL IVPB        1.5 g 100 mL/hr over 30 Minutes Intravenous Every 12 hours 05/07/12 1408 05/08/12 2159   05/07/12 1415   cefUROXime (ZINACEF) 1.5 g in dextrose 5 % 50 mL IVPB  Status:  Discontinued        1.5 g 100 mL/hr over 30 Minutes Intravenous Every 12 hours 05/07/12 1400 05/07/12 1408   05/06/12 0926   cefUROXime (ZINACEF) 1.5 g in dextrose 5 % 50 mL IVPB        1.5 g 100 mL/hr over 30 Minutes Intravenous 30 min pre-op 05/06/12 0926 05/07/12 0938          Assessment/Plan: s/p Procedure(s) (LRB) with comments: REPAIR FALSE ANEURYSM (Left) - Repair of Left Femoral Artery Aneurysm Of table. Has been up walking. Will DC Foley today and transferred to 2000.   LOS: 1 day   Jesse [name] 05/08/2012, 7:14 AM

## 2012-05-09 ENCOUNTER — Encounter (HOSPITAL_COMMUNITY): Payer: Self-pay | Admitting: Vascular Surgery

## 2012-05-09 LAB — BASIC METABOLIC PANEL
BUN: 15 mg/dL (ref 6–23)
CO2: 23 mEq/L (ref 19–32)
Chloride: 101 mEq/L (ref 96–112)
GFR calc non Af Amer: 65 mL/min — ABNORMAL LOW (ref >90)
Glucose, Bld: 104 mg/dL — ABNORMAL HIGH (ref 70–99)
Potassium: 3.6 mEq/L (ref 3.5–5.1)
Sodium: 131 mEq/L — ABNORMAL LOW (ref 135–145)

## 2012-05-09 NOTE — Progress Notes (Signed)
[facility]  Subjective  - POD#2 Repair of left common femoral artery aneurysm with a 12 mm Hemashield graft  He is ambulating well and is taking PO'S well.      Objective 122/63 64 98.3 F (36.8 C) (Oral) 19 91%  Intake/Output Summary (Last 24 hours) at 05/09/12 0827 Last data filed at 05/09/12 0600  Gross per 24 hour  Intake    530 ml  Output   1575 ml  Net  -1045 ml    Left groin without hematoma dressing intact 3+ popliteal pulse      Assessment/Planning: POD #2  Plan to D/C home with SON as care giver. F/U in the office is 2 weeks with Dr. Arbie Cookey.  Clinton [facility] 05/09/2012 8:27 AM --  Laboratory Lab Results:  Basename 05/08/12 0430  WBC 7.9  HGB 11.3*  HCT 34.0*  PLT 205   BMET  Basename 05/09/12 0525 05/08/12 0430  NA 131* 136  K 3.6 4.1  CL 101 106  CO2 23 24  GLUCOSE 104* 95  BUN 15 17  CREATININE 1.04 1.07  CALCIUM 9.0 8.5    COAG Lab Results  Component Value Date   INR 1.15 04/27/2012   INR 1.14 03/15/2012   No results found for this basename: PTT    Antibiotics Anti-infectives     Start     Dose/Rate Route Frequency Ordered Stop   05/07/12 2200   cefUROXime (ZINACEF) 1.5 g in dextrose 5 % 50 mL IVPB        1.5 g 100 mL/hr over 30 Minutes Intravenous Every 12 hours 05/07/12 1408 05/08/12 0931   05/07/12 1415   cefUROXime (ZINACEF) 1.5 g in dextrose 5 % 50 mL IVPB  Status:  Discontinued        1.5 g 100 mL/hr over 30 Minutes Intravenous Every 12 hours 05/07/12 1400 05/07/12 1408   05/06/12 0926   cefUROXime (ZINACEF) 1.5 g in dextrose 5 % 50 mL IVPB        1.5 g 100 mL/hr over 30 Minutes Intravenous 30 min pre-op 05/06/12 0926 05/07/12 1610

## 2012-05-09 NOTE — Progress Notes (Signed)
Occupational Therapy Treatment Patient Details Name: Jesse [name] MRN: [id number] DOB: 06-Jul-1928 Today's Date: 05/09/2012 Time: 7829-5621 OT Time Calculation (min): 9 min  OT Assessment / Plan / Recommendation Comments on Treatment Session Pt. doing very well.  Pt progressing toward goals.  Ready for discharge    Follow Up Recommendations  No OT follow up;Supervision - Intermittent    Barriers to Discharge       Equipment Recommendations  None recommended by PT;None recommended by OT    Recommendations for Other Services    Frequency     Plan All goals met and education completed, patient discharged from OT services    Precautions / Restrictions Precautions Precautions: Fall Restrictions Weight Bearing Restrictions: No   Pertinent Vitals/Pain     ADL  Grooming: Simulated;Modified independent Where Assessed - Grooming: Unsupported standing Lower Body Dressing: Performed;Modified independent Where Assessed - Lower Body Dressing: Unsupported sit to stand Toilet Transfer: Performed;Modified independent Toilet Transfer Method: Sit to Barista: Regular height toilet Toileting - Clothing Manipulation and Hygiene: Simulated;Modified independent Where Assessed - Toileting Clothing Manipulation and Hygiene: Standing Tub/Shower Transfer: Simulated;Modified independent Tub/Shower Transfer Method: Science writer: Shower seat with back;Grab bars Transfers/Ambulation Related to ADLs: modified independent ADL Comments: Pt. able to access feet today with no difficulty.  Pt. simulated stepping over tub modified independently.  He reports vision is cleared.     OT Diagnosis:    OT Problem List:   OT Treatment Interventions:     OT Goals ADL Goals ADL Goal: Grooming - Progress: Discontinued (comment) (discharge) ADL Goal: Lower Body Dressing - Progress: Met ADL Goal: Toilet Transfer - Progress: Discontinued (comment) (discharge) ADL  Goal: Toileting - Hygiene - Progress: Met ADL Goal: Tub/Shower Transfer - Progress: Met  Visit Information  Last OT Received On: 05/09/12 Assistance Needed: +1    Subjective Data      Prior Functioning       Cognition  Overall Cognitive Status: Appears within functional limits for tasks assessed/performed Arousal/Alertness: Awake/alert Orientation Level: Appears intact for tasks assessed Behavior During Session: [facility] for tasks performed    Mobility  Shoulder Instructions Transfers Transfers: Sit to Stand;Stand to Sit Sit to Stand: 6: Modified independent (Device/Increase time);From chair/3-in-1;From toilet Stand to Sit: 6: Modified independent (Device/Increase time);To chair/3-in-1;To toilet;Without upper extremity assist       Exercises      Balance     End of Session OT - End of Session Activity Tolerance: Patient tolerated treatment well Patient left: in chair;with family/visitor present  GO Functional Limitation: Self care Self Care Current Status (H0865): At least 1 percent but less than 20 percent impaired, limited or restricted Self Care Goal Status (H8469): 0 percent impaired, limited or restricted Self Care Discharge Status [phone]): 0 percent impaired, limited or restricted   Jesse [name] M 05/09/2012, 11:16 AM

## 2012-05-09 NOTE — Discharge Summary (Signed)
[facility] Discharge Summary   Patient ID:  Jesse [name] MRN: [id number] DOB/AGE: Mar 12, 1928 78 y.o.  Admit date: 05/07/2012 Discharge date: 05/09/2012 Date of Surgery: 05/07/2012 Surgeon: Surgeon(s): Larina Earthly, MD  Admission Diagnosis: Left Femoral Artery Aneurysm   Discharge Diagnoses:  Left Femoral Artery Aneurysm   Secondary Diagnoses: Past Medical History  Diagnosis Date  . Coronary artery disease   . Hyperlipidemia   . Hypertension   . Cancer 2010    prostate (radiation treatments)  . AAA (abdominal aortic aneurysm)   . Arthritis   . Myocardial infarction 1992  . Pneumonia     hx of  . Bronchitis     hx of appro 40 years ago  . Kidney stone 1968    Procedure(s): REPAIR FALSE ANEURYSM  Discharged Condition: good  HPI: Patient is here today for a discussion regarding his diffuse peripheral vascular aneurysms. He was seen with an extensive office visit on 02/21/2012. CT scan at that time showed a 5 cm common femoral artery aneurysms bilaterally. I have recommended elective repair and he is been seen by Dr. Excell Seltzer for cardiac clearance which has been obtained. He is a very pleasant and inquisitive and we had an extremely long discussion regarding the etiology of his diffuse arteriomegaly in aneurysms and also suggested treatment.     Hospital Course:  Jesse [name] is a 78 y.o. male is S/P Left Procedure(s): REPAIR FALSE ANEURYSM Extubated: POD # 0 Post-op wounds clean, dry, intact or healing well Pt. Ambulating, voiding and taking PO diet without difficulty. Pt pain controlled with PO pain meds. Labs as below Complications:none  Consults:     Significant Diagnostic Studies: CBC Lab Results  Component Value Date   WBC 7.9 05/08/2012   HGB 11.3* 05/08/2012   HCT 34.0* 05/08/2012   MCV 90.7 05/08/2012   PLT 205 05/08/2012    BMET    Component Value Date/Time   NA 131* 05/09/2012 0525   K 3.6 05/09/2012 0525   CL 101  05/09/2012 0525   CO2 23 05/09/2012 0525   GLUCOSE 104* 05/09/2012 0525   GLUCOSE 100* 05/05/2006 0839   BUN 15 05/09/2012 0525   CREATININE 1.04 05/09/2012 0525   CREATININE 1.00 02/20/2012 1452   CALCIUM 9.0 05/09/2012 0525   GFRNONAA 65* 05/09/2012 0525   GFRAA 75* 05/09/2012 0525   COAG Lab Results  Component Value Date   INR 1.15 04/27/2012   INR 1.14 03/15/2012     Disposition:  Discharge to :Home Discharge Orders    Future Appointments: Provider: Department: Dept Phone: Center:   05/22/2012 1:15 PM Larina Earthly, MD [facility] [phone] [facility]     Future Orders Please Complete By Expires   Resume previous diet      Driving Restrictions      Comments:   No driving for 2 weeks   Lifting restrictions      Comments:   No lifting for 6 weeks   Call MD for:  temperature >100.5      Call MD for:  redness, tenderness, or signs of infection (pain, swelling, bleeding, redness, odor or green/yellow discharge around incision site)      Call MD for:  severe or increased pain, loss or decreased feeling  in affected limb(s)      Discharge wound care:      Comments:   Shower daily with soap and water starting 05/09/12      [name]  Home Medication Instructions  BJY:[id number]   Printed on:05/09/12 0865  Medication Information                    loratadine (CLARITIN) 10 MG tablet Take 10 mg by mouth daily.            acetaminophen (TYLENOL) 500 MG tablet Take 500 mg by mouth every 6 (six) hours as needed.             carvedilol (COREG) 12.5 MG tablet Take 1 tablet (12.5 mg total) by mouth 2 (two) times daily.           simvastatin (ZOCOR) 40 MG tablet Take 1 tablet (40 mg total) by mouth at bedtime.           Polyvinyl Alcohol-Povidone (REFRESH OP) Apply 1 drop to eye 3 (three) times daily as needed. For dry eyes           Polyethyl Glycol-Propyl Glycol (SYSTANE OP) Place 1 drop into both eyes 2 (two) times daily.           hydrochlorothiazide (HYDRODIURIL)  12.5 MG tablet Take 1 tablet (12.5 mg total) by mouth daily.           losartan (COZAAR) 50 MG tablet Take 1 tablet (50 mg total) by mouth daily.           fenofibrate micronized (LOFIBRA) 200 MG capsule Take 1 capsule (200 mg total) by mouth daily before breakfast.           oxyCODONE (ROXICODONE) 5 MG immediate release tablet Take 1 tablet (5 mg total) by mouth every 6 (six) hours as needed for pain.            Verbal and written Discharge instructions given to the patient. Wound care per Discharge AVS Follow-up Information    Follow up with EARLY, TODD, MD. In 2 weeks. (Office will call you to arranage your appt (sent))    Contact information:   [address] [place] [address] [phone]          Signed: Clinton Gallant [facility] 05/09/2012, 8:32 AM

## 2012-05-21 ENCOUNTER — Encounter: Payer: Self-pay | Admitting: Vascular Surgery

## 2012-05-22 ENCOUNTER — Ambulatory Visit (INDEPENDENT_AMBULATORY_CARE_PROVIDER_SITE_OTHER): Payer: Medicare Other | Admitting: Vascular Surgery

## 2012-05-22 ENCOUNTER — Encounter: Payer: Self-pay | Admitting: Vascular Surgery

## 2012-05-22 VITALS — BP 125/75 | HR 66 | Resp 20 | Ht 72.0 in | Wt 189.6 lb

## 2012-05-22 DIAGNOSIS — I724 Aneurysm of artery of lower extremity: Secondary | ICD-10-CM | POA: Insufficient documentation

## 2012-05-22 DIAGNOSIS — Z48812 Encounter for surgical aftercare following surgery on the circulatory system: Secondary | ICD-10-CM

## 2012-05-22 NOTE — Progress Notes (Signed)
Patient has today for followup of staged bilateral common femoral artery aneurysm repair. Most recently this was the left side. He had a Hemashield graft placed from his distal external iliac artery up under the inguinal ligament down to the bifurcation of his femoral artery. He did well in the hospital and was discharged to home with no complications. He has electively to include playing saxophone. He reports the soreness is diminishing.  Physical exam: Incisions healing. There is an exposed Vicryl stitch right at the groin crease was some mild surrounding erythema. The Vicryl suture was removed. He does have palpable popliteal pulses.  Impression and plan stable recovery following staged bilateral femoral artery aneurysm repairs. I have written a prescription for Keflex 500 mg 3 times a day for 10 days due to the mild erythema at the incision. We will see him in the office in 6 months with lower surety duplex for followup of his popliteal artery aneurysms. He'll notify should he develop any wound problems or other difficulties in the interim

## 2012-05-23 NOTE — Addendum Note (Signed)
Addended by: Melodye Ped C on: 05/23/2012 11:45 AM   Modules accepted: Orders

## 2012-10-05 ENCOUNTER — Telehealth: Payer: Self-pay

## 2012-10-05 MED ORDER — HYDROCHLOROTHIAZIDE 12.5 MG PO TABS: 12.5000 mg | ORAL_TABLET | Freq: Every day | ORAL | Status: DC

## 2012-10-09 ENCOUNTER — Other Ambulatory Visit: Payer: Self-pay | Admitting: *Deleted

## 2012-10-09 MED ORDER — HYDROCHLOROTHIAZIDE 12.5 MG PO TABS: 12.5000 mg | ORAL_TABLET | Freq: Every day | ORAL | Status: DC

## 2012-10-11 NOTE — Telephone Encounter (Signed)
error 

## 2012-11-12 ENCOUNTER — Other Ambulatory Visit: Payer: Self-pay | Admitting: *Deleted

## 2012-11-12 MED ORDER — CARVEDILOL 12.5 MG PO TABS: 12.5000 mg | ORAL_TABLET | Freq: Two times a day (BID) | ORAL | Status: DC

## 2012-11-19 ENCOUNTER — Encounter: Payer: Self-pay | Admitting: Vascular Surgery

## 2012-11-20 ENCOUNTER — Ambulatory Visit: Payer: Medicare Other | Admitting: Vascular Surgery

## 2012-11-30 ENCOUNTER — Other Ambulatory Visit: Payer: Self-pay | Admitting: *Deleted

## 2012-11-30 MED ORDER — SIMVASTATIN 40 MG PO TABS: 40.0000 mg | ORAL_TABLET | Freq: Every day | ORAL | Status: DC

## 2012-12-03 ENCOUNTER — Encounter: Payer: Self-pay | Admitting: Vascular Surgery

## 2012-12-04 ENCOUNTER — Encounter: Payer: Self-pay | Admitting: Vascular Surgery

## 2012-12-04 ENCOUNTER — Ambulatory Visit (INDEPENDENT_AMBULATORY_CARE_PROVIDER_SITE_OTHER): Payer: Medicare Other | Admitting: Vascular Surgery

## 2012-12-04 ENCOUNTER — Other Ambulatory Visit: Payer: Self-pay | Admitting: *Deleted

## 2012-12-04 ENCOUNTER — Encounter (INDEPENDENT_AMBULATORY_CARE_PROVIDER_SITE_OTHER): Payer: Medicare Other | Admitting: *Deleted

## 2012-12-04 ENCOUNTER — Other Ambulatory Visit: Payer: Self-pay | Admitting: Vascular Surgery

## 2012-12-04 VITALS — BP 143/81 | HR 57 | Resp 18 | Ht 72.0 in | Wt 193.4 lb

## 2012-12-04 DIAGNOSIS — I714 Abdominal aortic aneurysm, without rupture, unspecified: Secondary | ICD-10-CM

## 2012-12-04 DIAGNOSIS — Z48812 Encounter for surgical aftercare following surgery on the circulatory system: Secondary | ICD-10-CM

## 2012-12-04 DIAGNOSIS — I724 Aneurysm of artery of lower extremity: Secondary | ICD-10-CM

## 2012-12-04 LAB — BUN: BUN: 26 mg/dL — ABNORMAL HIGH (ref 6–23)

## 2012-12-04 NOTE — Progress Notes (Signed)
Patient presents today for followup of his diffuse aneurysmal disease. He is status post resection of abdominal aortic aneurysm in 1995. He did have the development of bilateral femoral artery aneurysms and underwent a resection of these by myself and staged fashion. The right femoral artery resection was in August of last year and the left was aneurysm resection left common femoral artery in October of 2013. He does have known popliteal artery aneurysms which are larger on the left than on the right. He did well with his femoral artery aneurysm repairs and returned to his normal baseline. He is quite good today with no limitations in no discomfort.  Past Medical History  Diagnosis Date  . Coronary artery disease   . Hyperlipidemia   . Hypertension   . Cancer 2010    prostate (radiation treatments)  . AAA (abdominal aortic aneurysm)   . Arthritis   . Myocardial infarction 1992  . Pneumonia     hx of  . Bronchitis     hx of appro 40 years ago  . Kidney stone 1968    History  Substance Use Topics  . Smoking status: Former Smoker -- 20 years    Types: Cigarettes    Quit date: 07/25/1968  . Smokeless tobacco: Never Used  . Alcohol Use: No    History reviewed. No pertinent family history.  No Known Allergies  Current outpatient prescriptions:acetaminophen (TYLENOL) 500 MG tablet, Take 500 mg by mouth every 6 (six) hours as needed.  , Disp: , Rfl: ;  carvedilol (COREG) 12.5 MG tablet, Take 1 tablet (12.5 mg total) by mouth 2 (two) times daily., Disp: 180 tablet, Rfl: 2;  fenofibrate micronized (LOFIBRA) 200 MG capsule, Take 1 capsule (200 mg total) by mouth daily before breakfast., Disp: 90 capsule, Rfl: 3 hydrochlorothiazide (HYDRODIURIL) 12.5 MG tablet, Take 1 tablet (12.5 mg total) by mouth daily., Disp: 90 tablet, Rfl: 3;  loratadine (CLARITIN) 10 MG tablet, Take 10 mg by mouth daily. , Disp: , Rfl: ;  losartan (COZAAR) 50 MG tablet, Take 1 tablet (50 mg total) by mouth daily., Disp:  30 tablet, Rfl: 11;  Polyethyl Glycol-Propyl Glycol (SYSTANE OP), Place 1 drop into both eyes 2 (two) times daily., Disp: , Rfl:  Polyvinyl Alcohol-Povidone (REFRESH OP), Apply 1 drop to eye 3 (three) times daily as needed. For dry eyes, Disp: , Rfl: ;  simvastatin (ZOCOR) 40 MG tablet, Take 1 tablet (40 mg total) by mouth at bedtime., Disp: 90 tablet, Rfl: 2;  oxyCODONE (ROXICODONE) 5 MG immediate release tablet, Take 1 tablet (5 mg total) by mouth every 6 (six) hours as needed for pain., Disp: 30 tablet, Rfl: 0  BP 143/81  Pulse 57  Resp 18  Ht 6' (1.829 m)  Wt 193 lb 6.4 oz (87.726 kg)  BMI 26.22 kg/m2  Body mass index is 26.22 kg/(m^2).       Exam well-developed well-nourished gentleman appearing stated age in no acute distress Abdomen soft nontender he does have a diastases rectus. No aneurysm palpable. Lateral groin incisions are well-healed with normal femoral pulse and no evidence of aneurysm He does have prominent popliteal pulses bilaterally with palpable aneurysms. Is nontender. Does have 2+ dorsalis pedis pulses bilaterally Neurologically he is grossly intact Skin without ulcers or rashes Respirations equal and nonlabored Ultrasound today reveals progression in size of his left popliteal artery aneurysm up to 3.2 cm this is changed from 2.5 cm in December of 2012 maximal size of the right is 1.8 cm with  minimal change.  Impression and plan: Diffuse aneurysmal disease. I'm concerned regarding his left popliteal artery aneurysm. It as such and significant growth in the last 18 months. I have recommended CT angiogram for further evaluation of this. I did explain the risk for thrombosis in the higher risk of limb loss should this occur. We will obtain an outpatient CT angiogram and discuss recommendations further pending these results

## 2012-12-24 ENCOUNTER — Encounter: Payer: Self-pay | Admitting: Vascular Surgery

## 2012-12-25 ENCOUNTER — Encounter: Payer: Self-pay | Admitting: Vascular Surgery

## 2012-12-25 ENCOUNTER — Ambulatory Visit
Admission: RE | Admit: 2012-12-25 | Discharge: 2012-12-25 | Disposition: A | Discharge status: Home / Self Care | Payer: Medicare Other | Source: Ambulatory Visit | Attending: Vascular Surgery | Admitting: Vascular Surgery

## 2012-12-25 ENCOUNTER — Ambulatory Visit (INDEPENDENT_AMBULATORY_CARE_PROVIDER_SITE_OTHER): Payer: Medicare Other | Admitting: Vascular Surgery

## 2012-12-25 VITALS — BP 120/75 | HR 59 | Resp 16 | Ht 72.0 in | Wt 193.0 lb

## 2012-12-25 DIAGNOSIS — I714 Abdominal aortic aneurysm, without rupture: Secondary | ICD-10-CM

## 2012-12-25 DIAGNOSIS — M79609 Pain in unspecified limb: Secondary | ICD-10-CM | POA: Insufficient documentation

## 2012-12-25 DIAGNOSIS — Z48812 Encounter for surgical aftercare following surgery on the circulatory system: Secondary | ICD-10-CM

## 2012-12-25 DIAGNOSIS — I724 Aneurysm of artery of lower extremity: Secondary | ICD-10-CM

## 2012-12-25 MED ORDER — IOHEXOL 350 MG/ML SOLN
80.0000 mL | Freq: Once | INTRAVENOUS | Status: AC | PRN
  Administered 2012-12-25: 80 mL via INTRAVENOUS

## 2012-12-25 NOTE — Progress Notes (Signed)
Today for followup of popliteal artery aneurysm. CT of abdomen and pelvis today which shows stable prior repair. Did not have runoff. I discussed his explained to the patient. Will be scheduled this and see him back in the 2 weeks for continued discussion

## 2012-12-26 NOTE — Addendum Note (Signed)
Addended by: Sharee Pimple on: 12/26/2012 11:23 AM   Modules accepted: Orders

## 2012-12-31 ENCOUNTER — Ambulatory Visit
Admission: RE | Admit: 2012-12-31 | Discharge: 2012-12-31 | Disposition: A | Discharge status: Home / Self Care | Payer: Medicare Other | Source: Ambulatory Visit | Attending: Vascular Surgery | Admitting: Vascular Surgery

## 2012-12-31 DIAGNOSIS — I724 Aneurysm of artery of lower extremity: Secondary | ICD-10-CM

## 2012-12-31 DIAGNOSIS — M79609 Pain in unspecified limb: Secondary | ICD-10-CM

## 2012-12-31 MED ORDER — IOHEXOL 350 MG/ML SOLN
200.0000 mL | Freq: Once | INTRAVENOUS | Status: AC | PRN
  Administered 2012-12-31: 200 mL via INTRAVENOUS

## 2013-01-07 ENCOUNTER — Encounter: Payer: Self-pay | Admitting: Vascular Surgery

## 2013-01-08 ENCOUNTER — Ambulatory Visit (INDEPENDENT_AMBULATORY_CARE_PROVIDER_SITE_OTHER): Payer: Medicare Other | Admitting: Vascular Surgery

## 2013-01-08 ENCOUNTER — Encounter: Payer: Self-pay | Admitting: Vascular Surgery

## 2013-01-08 VITALS — BP 106/70 | HR 56 | Resp 16 | Ht 72.0 in | Wt 194.0 lb

## 2013-01-08 DIAGNOSIS — Z48812 Encounter for surgical aftercare following surgery on the circulatory system: Secondary | ICD-10-CM | POA: Insufficient documentation

## 2013-01-08 DIAGNOSIS — I724 Aneurysm of artery of lower extremity: Secondary | ICD-10-CM

## 2013-01-08 NOTE — Progress Notes (Signed)
The patient has today for continued discussion regarding his diffuse aneurysmal disease. He is status post resection of abdominal aortic aneurysm in the mid 1990s and a recent staged femoral artery aneurysm repair bilaterally by myself. 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 This did show some expansion ultrasound and he had been scheduled for a CT angiogram is abdomen pelvis and runoff. Unfortunately this was not accomplished and he had CT of his abdomen and pelvis only. He returns today for runoff study. This does show a 3.1 cm aneurysm the left popliteal artery 2.4 cm aneurysm on the right popliteal artery aneurysm. He has no symptoms referable to this. He has no new medical difficulties as visit 2 weeks ago.  Past Medical History  Diagnosis Date  . Coronary artery disease   . Hyperlipidemia   . Hypertension   . AAA (abdominal aortic aneurysm)   . Arthritis   . Myocardial infarction 1992  . Pneumonia     hx of  . Bronchitis     hx of appro 40 years ago  . Kidney stone 1968  . Cancer 2010    prostate ( 40 Txs. of radiation treatments)    History  Substance Use Topics  . Smoking status: Former Smoker -- 20 years    Types: Cigarettes    Quit date: 07/25/1968  . Smokeless tobacco: Never Used  . Alcohol Use: No    History reviewed. No pertinent family history.  No Known Allergies  Current outpatient prescriptions:acetaminophen (TYLENOL) 500 MG tablet, Take 500 mg by mouth every 6 (six) hours as needed.  , Disp: , Rfl: ;  carvedilol (COREG) 12.5 MG tablet, Take 1 tablet (12.5 mg total) by mouth 2 (two) times daily., Disp: 180 tablet, Rfl: 2;  fenofibrate micronized (LOFIBRA) 200 MG capsule, Take 1 capsule (200 mg total) by mouth daily before breakfast., Disp: 90 capsule, Rfl: 3 hydrochlorothiazide (HYDRODIURIL) 12.5 MG tablet, Take 1 tablet (12.5 mg total) by mouth daily., Disp: 90 tablet, Rfl: 3;  loratadine (CLARITIN) 10 MG tablet, Take 10 mg by mouth daily. , Disp: , Rfl: ;   losartan (COZAAR) 50 MG tablet, Take 1 tablet (50 mg total) by mouth daily., Disp: 30 tablet, Rfl: 11;  Polyethyl Glycol-Propyl Glycol (SYSTANE OP), Place 1 drop into both eyes 2 (two) times daily., Disp: , Rfl:  Polyvinyl Alcohol-Povidone (REFRESH OP), Apply 1 drop to eye 3 (three) times daily as needed. For dry eyes, Disp: , Rfl: ;  simvastatin (ZOCOR) 40 MG tablet, Take 1 tablet (40 mg total) by mouth at bedtime., Disp: 90 tablet, Rfl: 2;  oxyCODONE (ROXICODONE) 5 MG immediate release tablet, Take 1 tablet (5 mg total) by mouth every 6 (six) hours as needed for pain., Disp: 30 tablet, Rfl: 0  BP 106/70  Pulse 56  Resp 16  Ht 6' (1.829 m)  Wt 194 lb (87.998 kg)  BMI 26.31 kg/m2  SpO2 98%  Body mass index is 26.31 kg/(m^2).       Physical exam is unchanged. He does have a prominent popliteal pulses bilaterally with no tenderness.  I did review his CT of this femoral and runoff and discussed this at length with the patient. This does show extreme diffuse arteriomegaly throughout his lower extremity. His native superficial arteries proximally 2 cm throughout its course. His popliteal artery and left and mildly dilated on the right.  Had a long discussion with the patient. I explained that he does have a significant diameter of his popliteal arteries bilaterally but  in comparison to his native surrounding arteries has minimal change to his diffuse arteriomegaly. I would not recommend any treatment since I feel that this puts him at minimal risk for thrombosis or rupture. He is understands this and leave this discussion. He will see [place] again in 6 months with repeat duplex of his popliteal artery

## 2013-01-09 ENCOUNTER — Other Ambulatory Visit: Payer: Self-pay | Admitting: *Deleted

## 2013-01-09 DIAGNOSIS — Z48812 Encounter for surgical aftercare following surgery on the circulatory system: Secondary | ICD-10-CM

## 2013-01-09 DIAGNOSIS — I724 Aneurysm of artery of lower extremity: Secondary | ICD-10-CM

## 2013-03-11 ENCOUNTER — Other Ambulatory Visit: Payer: Self-pay | Admitting: Cardiology

## 2013-03-11 MED ORDER — LOSARTAN POTASSIUM 50 MG PO TABS: 50.0000 mg | ORAL_TABLET | Freq: Every day | ORAL | Status: DC

## 2013-04-01 ENCOUNTER — Other Ambulatory Visit: Payer: Self-pay | Admitting: *Deleted

## 2013-04-02 ENCOUNTER — Other Ambulatory Visit: Payer: Self-pay | Admitting: *Deleted

## 2013-04-02 MED ORDER — FENOFIBRATE MICRONIZED 200 MG PO CAPS: 200.0000 mg | ORAL_CAPSULE | Freq: Every day | ORAL | Status: DC

## 2013-04-11 ENCOUNTER — Ambulatory Visit: Payer: Medicare Other | Admitting: Cardiovascular Disease

## 2013-04-17 ENCOUNTER — Encounter: Payer: Self-pay | Admitting: Cardiovascular Disease

## 2013-04-17 ENCOUNTER — Ambulatory Visit (INDEPENDENT_AMBULATORY_CARE_PROVIDER_SITE_OTHER): Payer: Medicare Other | Admitting: Cardiovascular Disease

## 2013-04-17 VITALS — BP 124/68 | HR 62 | Wt 193.0 lb

## 2013-04-17 DIAGNOSIS — I10 Essential (primary) hypertension: Secondary | ICD-10-CM

## 2013-04-17 DIAGNOSIS — E785 Hyperlipidemia, unspecified: Secondary | ICD-10-CM

## 2013-04-17 DIAGNOSIS — I25810 Atherosclerosis of coronary artery bypass graft(s) without angina pectoris: Secondary | ICD-10-CM

## 2013-04-17 NOTE — Patient Instructions (Addendum)
Your physician has requested that you have an adenosine myoview. For further information please visit [email or web address] Please follow instruction sheet, as given.  Your physician recommends that you return for a FASTING LIPID and LIVER Profile--nothing to eat or drink after midnight, lab opens at 7:30 (this can be done the same day as myoview)  Your physician wants you to follow-up in: 1 YEAR with Dr Excell Seltzer.  You will receive a reminder letter in the mail two months in advance. If you don't receive a letter, please call our office to schedule the follow-up appointment.  Your physician recommends that you continue on your current medications as directed. Please refer to the Current Medication list given to you today.

## 2013-04-17 NOTE — Progress Notes (Signed)
HPI:  79 year old gentleman presenting for followup evaluation. The patient has a history of diffuse aneurysmal disease. He is followed by Dr. Arbie Cookey. He also has coronary artery disease status post remote CABG about 20 years ago. The patient has a left bundle branch block. He presents today for followup.  He complains of exertional dyspnea. He is more short of breath when playing his musical instrument. He denies chest pain or pressure. He denies orthopnea, PND, or palpitations. He complains of postnasal drip and wheezing at nighttime.  Outpatient Encounter Prescriptions as of 04/17/2013  Medication Sig Dispense Refill  . acetaminophen (TYLENOL) 500 MG tablet Take 500 mg by mouth every 6 (six) hours as needed.        . carvedilol (COREG) 12.5 MG tablet Take 1 tablet (12.5 mg total) by mouth 2 (two) times daily.  180 tablet  2  . fenofibrate micronized (LOFIBRA) 200 MG capsule Take 1 capsule (200 mg total) by mouth daily before breakfast.  90 capsule  1  . hydrochlorothiazide (HYDRODIURIL) 12.5 MG tablet Take 1 tablet (12.5 mg total) by mouth daily.  90 tablet  3  . loratadine (CLARITIN) 10 MG tablet Take 10 mg by mouth daily.       Marland Kitchen losartan (COZAAR) 50 MG tablet Take 1 tablet (50 mg total) by mouth daily.  90 tablet  1  . oxyCODONE (ROXICODONE) 5 MG immediate release tablet Take 1 tablet (5 mg total) by mouth every 6 (six) hours as needed for pain.  30 tablet  0  . Polyethyl Glycol-Propyl Glycol (SYSTANE OP) Place 1 drop into both eyes 2 (two) times daily.      . Polyvinyl Alcohol-Povidone (REFRESH OP) Apply 1 drop to eye 3 (three) times daily as needed. For dry eyes      . simvastatin (ZOCOR) 40 MG tablet Take 1 tablet (40 mg total) by mouth at bedtime.  90 tablet  2   No facility-administered encounter medications on file as of 04/17/2013.    No Known Allergies  Past Medical History  Diagnosis Date  . Coronary artery disease   . Hyperlipidemia   . Hypertension   . AAA (abdominal  aortic aneurysm)   . Arthritis   . Myocardial infarction 1992  . Pneumonia     hx of  . Bronchitis     hx of appro 40 years ago  . Kidney stone 1968  . Cancer 2010    prostate ( 40 Txs. of radiation treatments)    ROS: Negative except as per HPI  BP 124/68  Pulse 62  Wt 193 lb (87.544 kg)  BMI 26.17 kg/m2  PHYSICAL EXAM: Pt is alert and oriented,  pleasant elderly male in NAD HEENT: normal Neck: JVP - normal, carotids 2+= without bruits Lungs: CTA bilaterally CV: RRR without murmur or gallop Abd: soft, NT, Positive BS, no hepatomegaly Ext: no C/C/E, distal pulses intact and equal Skin: warm/dry no rash  EKG:  normal sinus rhythm 62 beats per minute, left bundle branch block   ASSESSMENT AND PLAN: 1. Coronary artery disease, native vessel. The patient had remote CABG. He does have some worsening in dyspnea in this could be an anginal equivalent. His EKG is nondiagnostic because of left bundle branch block. Recommend a Myoview scan for further risk stratification.  2. Hyperlipidemia. The patient is treated with simvastatin and fenofibrate. Lipids from September 2012 showed cholesterol of 136, HDL 36, and LDL 87. His primary care physician is following his lipids and LFTs.  3. Hypertension. Blood pressure is well controlled on carvedilol, losartan, and hydrochlorothiazide.  4. Arteriomegaly and diffuse aneurysmal disease. Followed by vascular surgery.  Tonny Bollman 04/17/2013 4:13 PM

## 2013-05-03 ENCOUNTER — Encounter: Payer: Self-pay | Admitting: Cardiology

## 2013-05-03 ENCOUNTER — Encounter: Payer: Self-pay | Admitting: *Deleted

## 2013-05-08 ENCOUNTER — Ambulatory Visit (HOSPITAL_COMMUNITY): Payer: Medicare Other | Attending: Cardiology | Admitting: Radiology

## 2013-05-08 ENCOUNTER — Other Ambulatory Visit (INDEPENDENT_AMBULATORY_CARE_PROVIDER_SITE_OTHER): Payer: Medicare Other

## 2013-05-08 VITALS — BP 113/88 | Ht 72.0 in | Wt 194.0 lb

## 2013-05-08 DIAGNOSIS — E785 Hyperlipidemia, unspecified: Secondary | ICD-10-CM

## 2013-05-08 DIAGNOSIS — R0989 Other specified symptoms and signs involving the circulatory and respiratory systems: Secondary | ICD-10-CM | POA: Insufficient documentation

## 2013-05-08 DIAGNOSIS — I25810 Atherosclerosis of coronary artery bypass graft(s) without angina pectoris: Secondary | ICD-10-CM

## 2013-05-08 DIAGNOSIS — I10 Essential (primary) hypertension: Secondary | ICD-10-CM | POA: Insufficient documentation

## 2013-05-08 DIAGNOSIS — I252 Old myocardial infarction: Secondary | ICD-10-CM | POA: Insufficient documentation

## 2013-05-08 DIAGNOSIS — R0609 Other forms of dyspnea: Secondary | ICD-10-CM | POA: Insufficient documentation

## 2013-05-08 DIAGNOSIS — R9439 Abnormal result of other cardiovascular function study: Secondary | ICD-10-CM | POA: Insufficient documentation

## 2013-05-08 DIAGNOSIS — Z87891 Personal history of nicotine dependence: Secondary | ICD-10-CM | POA: Insufficient documentation

## 2013-05-08 DIAGNOSIS — Z951 Presence of aortocoronary bypass graft: Secondary | ICD-10-CM | POA: Insufficient documentation

## 2013-05-08 DIAGNOSIS — I739 Peripheral vascular disease, unspecified: Secondary | ICD-10-CM | POA: Insufficient documentation

## 2013-05-08 DIAGNOSIS — I4949 Other premature depolarization: Secondary | ICD-10-CM

## 2013-05-08 DIAGNOSIS — I447 Left bundle-branch block, unspecified: Secondary | ICD-10-CM | POA: Insufficient documentation

## 2013-05-08 DIAGNOSIS — Z8249 Family history of ischemic heart disease and other diseases of the circulatory system: Secondary | ICD-10-CM | POA: Insufficient documentation

## 2013-05-08 LAB — LIPID PANEL: VLDL: 14.4 mg/dL (ref 0.0–40.0)

## 2013-05-08 LAB — HEPATIC FUNCTION PANEL
ALT: 16 U/L (ref 0–53)
Bilirubin, Direct: 0 mg/dL (ref 0.0–0.3)
Total Bilirubin: 0.8 mg/dL (ref 0.3–1.2)

## 2013-05-08 MED ORDER — TECHNETIUM TC 99M SESTAMIBI GENERIC - CARDIOLITE
30.0000 | Freq: Once | INTRAVENOUS | Status: AC | PRN
  Administered 2013-05-08: 30 via INTRAVENOUS

## 2013-05-08 MED ORDER — ADENOSINE (DIAGNOSTIC) 3 MG/ML IV SOLN
0.5600 mg/kg | Freq: Once | INTRAVENOUS | Status: AC
  Administered 2013-05-08: 49.2 mg via INTRAVENOUS

## 2013-05-08 MED ORDER — TECHNETIUM TC 99M SESTAMIBI GENERIC - CARDIOLITE
10.0000 | Freq: Once | INTRAVENOUS | Status: AC | PRN
  Administered 2013-05-08: 10 via INTRAVENOUS

## 2013-05-08 NOTE — Progress Notes (Signed)
[facility] SITE 3 NUCLEAR MED [address] [place], [place] [address] [phone]    Cardiology Nuclear Med Study  Jesse [name] is a 79 y.o. male     MRN : [id number]     DOB: [birth year]  Procedure Date: 05/08/2013  Nuclear Med Background Indication for Stress Test:  Evaluation for Ischemia and Graft Patency History:  '92 MI> CABG; '99 Cath: Patent Grafts, EF=30%; '07 Echo: EF=55%, and 04-2011 Myocardial Perfusion Study-Inferior/inferolateral scar, minimal peri infarct ischemia, EF=50% Cardiac Risk Factors: Family History - CAD, History of Smoking, Hypertension, LBBB, Lipids and PVD  Symptoms:  DOE   Nuclear Pre-Procedure Caffeine/Decaff Intake:  None > 12 hrs NPO After: 7:00pm   Lungs:  clear O2 Sat: 97% on room air. IV 0.9% NS with Angio Cath:  20g  IV Site: R Antecubital x 1, tolerated well IV Started by:  Jesse Hong, RN  Chest Size (in):  44 Cup Size: n/a  Height: 6' (1.829 m)  Weight:  194 lb (87.998 kg)  BMI:  Body mass index is 26.31 kg/(m^2). Tech Comments:  Coreg last night    Nuclear Med Study 1 or 2 day study: 1 day  Stress Test Type:  Adenosine  Reading MD: Jesse Ancona, MD  Order Authorizing Provider:  Darci Needle, MD  Resting Radionuclide: Technetium 25m Sestamibi  Resting Radionuclide Dose: 11.0 mCi   Stress Radionuclide:  Technetium 72m Sestamibi  Stress Radionuclide Dose: 33.0 mCi           Stress Protocol Rest [name]: 55 Stress [name]: 67  Rest BP: 113/88 Stress BP: 123/71  Exercise Time (min): n/a METS: n/a   Predicted [name] [name]: 137 bpm % [name] [name]: 48.91 bpm Rate Pressure Product: 8241   Dose of Adenosine (mg):  49.4 Dose of Lexiscan: n/a mg  Dose of Atropine (mg): n/a Dose of Dobutamine: n/a mcg/kg/min (at Bow [name])  Stress Test Technologist: Jesse [name], EMT-P  Nuclear Technologist:  Jesse [name], CNMT     Rest Procedure:  Myocardial perfusion imaging was performed at rest 45 minutes following the intravenous administration of  Technetium 61m Sestamibi. Rest ECG: NSR-LBBB  Stress Procedure:  The patient received IV adenosine at 140 mcg/kg/min for 4 minutes. This patient had sob, neck tightness, warm, and flushed with the Adenosine infusion. Technetium 14m Sestamibi was injected at the 2 minute mark and quantitative spect images were obtained after a 45 minute delay. Stress ECG: Uninteretable due to baseline LBBB  QPS Raw Data Images:  Normal; no motion artifact; normal heart/lung ratio. Stress Images:  Medium-sized, moderate basal to mid inferior and inferolateral perfusion defect.  Rest Images:  Medium-sized, moderate basal to mid inferior and inferolateral perfusion defect. Subtraction (SDS):  Fixed, medium-sized moderate basal to mid inferior and inferolateral perfusion defect.  Transient Ischemic Dilatation (Normal <1.22):  1.04 Lung/Heart Ratio (Normal <0.45):  0.40  Quantitative Gated Spect Images QGS EDV:  180 ml QGS ESV:  94 ml  Impression Exercise Capacity:  Adenosine study with no exercise. BP Response:  Normal blood pressure response. Clinical Symptoms:  Short of breath, flushed.  ECG Impression:  Baseline:  LBBB.  EKG uninterpretable due to LBBB at rest and stress. Comparison with Prior Nuclear Study: No significant change from previous study  Overall Impression:  Low risk stress nuclear study. There is a primarily fixed basal to mid inferior and inferolateral perfusion defect, medium-sized and moderate in intensity.  Inferolateral hypokinesis. .  LV Ejection Fraction: 48%.  LV Wall Motion:  Inferolateral hypokinesis.   Jesse [name] 05/08/2013

## 2013-07-16 ENCOUNTER — Encounter: Payer: Self-pay | Admitting: Family

## 2013-07-16 ENCOUNTER — Encounter (HOSPITAL_COMMUNITY): Payer: Medicare Other

## 2013-07-16 ENCOUNTER — Ambulatory Visit: Payer: Medicare Other | Admitting: Vascular Surgery

## 2013-07-16 ENCOUNTER — Other Ambulatory Visit (HOSPITAL_COMMUNITY): Payer: Medicare Other

## 2013-07-17 ENCOUNTER — Ambulatory Visit (HOSPITAL_COMMUNITY)
Admission: RE | Admit: 2013-07-17 | Discharge: 2013-07-17 | Disposition: A | Discharge status: Home / Self Care | Payer: Medicare Other | Source: Ambulatory Visit | Attending: Family | Admitting: Family

## 2013-07-17 ENCOUNTER — Encounter: Payer: Self-pay | Admitting: Family

## 2013-07-17 ENCOUNTER — Ambulatory Visit (INDEPENDENT_AMBULATORY_CARE_PROVIDER_SITE_OTHER): Payer: Medicare Other | Admitting: Family

## 2013-07-17 ENCOUNTER — Encounter (INDEPENDENT_AMBULATORY_CARE_PROVIDER_SITE_OTHER): Payer: Self-pay

## 2013-07-17 VITALS — BP 108/68 | HR 60 | Resp 14 | Ht 72.0 in | Wt 193.0 lb

## 2013-07-17 DIAGNOSIS — Z48812 Encounter for surgical aftercare following surgery on the circulatory system: Secondary | ICD-10-CM

## 2013-07-17 DIAGNOSIS — I724 Aneurysm of artery of lower extremity: Secondary | ICD-10-CM | POA: Insufficient documentation

## 2013-07-17 DIAGNOSIS — Z09 Encounter for follow-up examination after completed treatment for conditions other than malignant neoplasm: Secondary | ICD-10-CM | POA: Insufficient documentation
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
## 2013-08-14 ENCOUNTER — Other Ambulatory Visit: Payer: Self-pay

## 2013-08-14 MED ORDER — LOSARTAN POTASSIUM 50 MG PO TABS: 50.0000 mg | ORAL_TABLET | Freq: Every day | ORAL | Status: DC

## 2013-08-20 ENCOUNTER — Other Ambulatory Visit: Payer: Self-pay | Admitting: *Deleted

## 2013-08-20 MED ORDER — CARVEDILOL 12.5 MG PO TABS: 12.5000 mg | ORAL_TABLET | Freq: Two times a day (BID) | ORAL | Status: DC

## 2013-09-12 ENCOUNTER — Other Ambulatory Visit: Payer: Self-pay

## 2013-09-12 MED ORDER — SIMVASTATIN 40 MG PO TABS: 40.0000 mg | ORAL_TABLET | Freq: Every day | ORAL | Status: DC

## 2013-10-22 ENCOUNTER — Other Ambulatory Visit: Payer: Self-pay

## 2013-10-22 MED ORDER — FENOFIBRATE MICRONIZED 200 MG PO CAPS: 200.0000 mg | ORAL_CAPSULE | Freq: Every day | ORAL | Status: DC

## 2013-12-23 ENCOUNTER — Other Ambulatory Visit: Payer: Self-pay | Admitting: *Deleted

## 2013-12-23 MED ORDER — HYDROCHLOROTHIAZIDE 12.5 MG PO TABS: 12.5000 mg | ORAL_TABLET | Freq: Every day | ORAL | Status: DC

## 2014-02-11 ENCOUNTER — Other Ambulatory Visit: Payer: Self-pay | Admitting: *Deleted

## 2014-02-11 MED ORDER — LOSARTAN POTASSIUM 50 MG PO TABS: 50.0000 mg | ORAL_TABLET | Freq: Every day | ORAL | Status: DC

## 2014-02-19 ENCOUNTER — Other Ambulatory Visit: Payer: Self-pay

## 2014-02-19 MED ORDER — HYDROCHLOROTHIAZIDE 12.5 MG PO TABS: 12.5000 mg | ORAL_TABLET | Freq: Every day | ORAL | Status: DC

## 2014-02-28 ENCOUNTER — Encounter: Payer: Self-pay | Admitting: Cardiovascular Disease

## 2014-02-28 NOTE — Telephone Encounter (Signed)
New problem    Pt needs a call back about medications please.

## 2014-02-28 NOTE — Telephone Encounter (Signed)
This encounter was created in error - please disregard.

## 2014-03-04 ENCOUNTER — Other Ambulatory Visit: Payer: Self-pay | Admitting: *Deleted

## 2014-03-04 ENCOUNTER — Telehealth: Payer: Self-pay | Admitting: *Deleted

## 2014-03-04 DIAGNOSIS — I714 Abdominal aortic aneurysm, without rupture, unspecified: Secondary | ICD-10-CM

## 2014-03-04 DIAGNOSIS — Z48812 Encounter for surgical aftercare following surgery on the circulatory system: Secondary | ICD-10-CM

## 2014-03-04 DIAGNOSIS — M79606 Pain in leg, unspecified: Secondary | ICD-10-CM

## 2014-03-04 NOTE — Telephone Encounter (Signed)
Mr. Wainer called to report that he is having increasing rest pain in his BLE when they are hanging down; this pain is from the knees to his feet. No coolness, discoloration or ulcerations are noted. 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 Patient will be taking a long airplane flight to [place] on 03-27-14; this will be approx 12-13 hours long. I discussed this with Dr. Donnetta Hutching and he has ordered an CTA abdomen / pelvis with runoff to be done prior to an office appt with him on 03-11-14. Patient voiced understanding of this plan.

## 2014-03-05 ENCOUNTER — Other Ambulatory Visit: Payer: Self-pay

## 2014-03-05 MED ORDER — FENOFIBRATE MICRONIZED 200 MG PO CAPS: 200.0000 mg | ORAL_CAPSULE | Freq: Every day | ORAL | Status: DC

## 2014-03-10 ENCOUNTER — Encounter: Payer: Self-pay | Admitting: Vascular Surgery

## 2014-03-10 ENCOUNTER — Other Ambulatory Visit: Payer: Self-pay | Admitting: Vascular Surgery

## 2014-03-11 ENCOUNTER — Ambulatory Visit (INDEPENDENT_AMBULATORY_CARE_PROVIDER_SITE_OTHER): Payer: Medicare Other | Admitting: Vascular Surgery

## 2014-03-11 ENCOUNTER — Encounter: Payer: Self-pay | Admitting: Vascular Surgery

## 2014-03-11 ENCOUNTER — Ambulatory Visit
Admission: RE | Admit: 2014-03-11 | Discharge: 2014-03-11 | Disposition: A | Discharge status: Home / Self Care | Payer: Medicare Other | Source: Ambulatory Visit | Attending: Vascular Surgery | Admitting: Vascular Surgery

## 2014-03-11 VITALS — BP 125/83 | HR 59 | Ht 72.0 in | Wt 195.0 lb

## 2014-03-11 DIAGNOSIS — Z48812 Encounter for surgical aftercare following surgery on the circulatory system: Secondary | ICD-10-CM

## 2014-03-11 DIAGNOSIS — I714 Abdominal aortic aneurysm, without rupture, unspecified: Secondary | ICD-10-CM

## 2014-03-11 DIAGNOSIS — I724 Aneurysm of artery of lower extremity: Secondary | ICD-10-CM

## 2014-03-11 DIAGNOSIS — M79606 Pain in leg, unspecified: Secondary | ICD-10-CM

## 2014-03-11 LAB — CREATININE, SERUM: Creat: 0.99 mg/dL (ref 0.50–1.35)

## 2014-03-11 LAB — BUN: BUN: 25 mg/dL — AB (ref 6–23)

## 2014-03-11 MED ORDER — IOHEXOL 350 MG/ML SOLN
150.0000 mL | Freq: Once | INTRAVENOUS | Status: AC | PRN
  Administered 2014-03-11: 150 mL via INTRAVENOUS

## 2014-03-11 NOTE — Progress Notes (Signed)
Patient name: Jesse [name] MRN: [id number] DOB: [birth year] Sex: male   Referred by: Sabra Heck  Reason for referral:  Chief Complaint  Patient presents with  . Re-evaluation    c/o discomfort in Lt LE    HISTORY OF PRESENT ILLNESS: Patient is a very pleasant 80 year old gentleman with a very long history of diffuse aneurysmal disease. He has known popliteal and internal iliac aneurysms and has been followed with ultrasound due to these. He is about to leave on a trip to [place] for 2 weeks and call the several days ago complaining of some discomfort in his left calf. Recommended he undergo CT angiogram for further evaluation of this and is seen today to discuss this further. He does report mainly knee discomfort and some engorgement and some superficial veins in his catheter causing some discomfort as well. No suggestion of ischemia and no evidence of symptoms from his popliteal artery aneurysms today.  Past Medical History  Diagnosis Date  . Coronary artery disease   . Hyperlipidemia   . Hypertension   . AAA (abdominal aortic aneurysm)   . Arthritis   . Myocardial infarction 1992  . Pneumonia     hx of  . Bronchitis     hx of appro 40 years ago  . Kidney stone 1968  . Cancer 2010    prostate ( 40 Txs. of radiation treatments)    Past Surgical History  Procedure Laterality Date  . Repair of ventral hernia  04-20-1994    P. Cameron Sprang MD  . Abdominal aortic aneurysm repair  08-13-1993    Dr Cameron Sprang  . Coronary artery bypass graft  03-01-1991    Dr. Servando Snare  . Repair of bowel obstruction  1999    Dr. Dalbert Batman  . Eye surgery      Detached retina (Left eye); bilateral cataract removal  . Colonoscopy w/ polypectomy    . Hernia repair    . Resection femoral artery  03/19/2012    Dr. [name] Areta Terwilliger  . False aneurysm repair  05/07/2012    Procedure: REPAIR FALSE ANEURYSM;  Surgeon: Rosetta Posner, MD;  Location: [facility] OR;  Service: Vascular;  Laterality: Left;  Repair of Left  Femoral Artery Aneurysm    History   Social History  . Marital Status: Widowed    Spouse Name: N/A    Number of Children: N/A  . Years of Education: N/A   Occupational History  . Not on file.   Social History Main Topics  . Smoking status: Former Smoker -- 20 years    Types: Cigarettes    Quit date: 07/25/1968  . Smokeless tobacco: Never Used  . Alcohol Use: No  . Drug Use: No  . Sexual Activity: Not on file   Other Topics Concern  . Not on file   Social History Narrative  . No narrative on file    History reviewed. No pertinent family history.  Allergies as of 03/11/2014 - Review Complete 03/11/2014  Allergen Reaction Noted  . Adhesive [tape]  07/17/2013    Current Outpatient Prescriptions on File Prior to Visit  Medication Sig Dispense Refill  . acetaminophen (TYLENOL) 500 MG tablet Take 500 mg by mouth every 6 (six) hours as needed.        . carvedilol (COREG) 12.5 MG tablet Take 1 tablet (12.5 mg total) by mouth 2 (two) times daily.  180 tablet  2  . fenofibrate micronized (LOFIBRA) 200 MG capsule Take 1 capsule (200  mg total) by mouth daily before breakfast.  90 capsule  0  . hydrochlorothiazide (HYDRODIURIL) 12.5 MG tablet Take 1 tablet (12.5 mg total) by mouth daily.  90 tablet  1  . loratadine (CLARITIN) 10 MG tablet Take 10 mg by mouth daily.       Marland Kitchen losartan (COZAAR) 50 MG tablet Take 1 tablet (50 mg total) by mouth daily.  90 tablet  0  . Polyethyl Glycol-Propyl Glycol (SYSTANE OP) Place 1 drop into both eyes 2 (two) times daily.      . Polyvinyl Alcohol-Povidone (REFRESH OP) Apply 1 drop to eye 3 (three) times daily as needed. For dry eyes      . simvastatin (ZOCOR) 40 MG tablet Take 1 tablet (40 mg total) by mouth at bedtime.  90 tablet  2   No current facility-administered medications on file prior to visit.        PHYSICAL EXAMINATION:  General: The patient is a well-nourished male, in no acute distress. Vital signs are BP 125/83  Pulse 59   Ht 6' (1.829 m)  Wt 195 lb (88.451 kg)  BMI 26.44 kg/m2  SpO2 99% Pulmonary: There is a good air exchange  Abdomen: Soft and non-tender. Does have an umbilical ventral hernia Musculoskeletal: There are no major deformities.  There is no significant extremity pain. Neurologic: No focal weakness or paresthesias are detected, Skin: There are no ulcer or rashes noted. Psychiatric: The patient has normal affect. Cardiovascular: 2+ radial pulses bilaterally. He has 3+ popliteal pulses bilaterally palpable posterior tibial pulses.   CT angiogram was discussed with the patient after review by myself. He has a multiple aneurysms in the extreme diffuse arteriomegaly. And his pelvis he does have a 5.3 cm right internal iliac artery aneurysm. There is no inflow from his common iliac artery into this. This is fed only by collateral circulation. Since his last CT scan this has grown from 4.8 cm to 5.3 cm. He does have bilateral popliteal artery aneurysms. On the left this is now 4 cm and on the right is 2.5 cm.   Impression and Plan:  Diffuse arteriomegaly with clinical aneurysms and his iliac arteries on the internal iliac and bilateral popliteal artery aneurysms. I again discussed the diffuse nature this is a have in the past. He has had progressive enlargement. I have recommended observation only in the past but these are certainly now becoming larger her. I have recommended he go to a [place] and had the usual DVT and upper cautions with walking and calf exercises while in a long flight. We'll see him back in approximately one month to discuss this further upon his return.    Leary Mcnulty [facility] [place] Office: [phone]

## 2014-03-27 ENCOUNTER — Other Ambulatory Visit: Payer: Self-pay

## 2014-03-27 MED ORDER — CARVEDILOL 12.5 MG PO TABS: 12.5000 mg | ORAL_TABLET | Freq: Two times a day (BID) | ORAL | Status: DC

## 2014-04-14 ENCOUNTER — Encounter: Payer: Self-pay | Admitting: Vascular Surgery

## 2014-04-15 ENCOUNTER — Encounter: Payer: Self-pay | Admitting: Vascular Surgery

## 2014-04-15 ENCOUNTER — Ambulatory Visit (INDEPENDENT_AMBULATORY_CARE_PROVIDER_SITE_OTHER): Payer: Medicare Other | Admitting: Vascular Surgery

## 2014-04-15 VITALS — BP 132/90 | HR 56 | Temp 98.2°F | Resp 16 | Ht 72.0 in | Wt 188.0 lb

## 2014-04-15 DIAGNOSIS — I724 Aneurysm of artery of lower extremity: Secondary | ICD-10-CM

## 2014-04-15 DIAGNOSIS — Z48812 Encounter for surgical aftercare following surgery on the circulatory system: Secondary | ICD-10-CM

## 2014-04-15 NOTE — Progress Notes (Signed)
Here today for continued discussion of diffuse or determine degree of diffuse aneurysmal formation. We'll see him in a month ago for you and extended trip to [place]. He is returns today for further discussion. He had a very good time with his travel and no difficulty medical standpoint.  Physical exam is unchanged. Abdomen is nontender. He has a very prominent popliteal pulsations bilaterally which are nontender.  Again reviewed his CT scan and duplex findings with him. He does have diffuse aneurysmal degeneration of both internal iliac arteries having had prior open aneurysm repair. Also has known bilateral popular artery aneurysms.  Explained this is difficult problem. I explained that each one of these has a slight risk for causing difficulty with either rupture or thrombosis. Explained to be impossible to know what would be the most likely first artery to cause trouble. Explain only option on August treat everything versus continued observation. I certainly have recommended observation only explaining that treating all of his different aneurysmal degeneration would be quite extensive with multiple surgeries with the likelihood of complications related to all these operations were done in a single risk of a single aneurysm. He is comfortable with this discussion and will see [place] again in one year with repeat duplex followup of his diffuse aneurysmal change. Again reviewed symptoms with him and he will notify us immediately should this occur

## 2014-04-15 NOTE — Addendum Note (Signed)
Addended by: Mena Goes on: 04/15/2014 04:45 PM   Modules accepted: Orders

## 2014-05-19 ENCOUNTER — Encounter: Payer: Self-pay | Admitting: Cardiovascular Disease

## 2014-05-19 ENCOUNTER — Ambulatory Visit (INDEPENDENT_AMBULATORY_CARE_PROVIDER_SITE_OTHER): Payer: Medicare Other | Admitting: Cardiovascular Disease

## 2014-05-19 VITALS — BP 130/68 | HR 60 | Ht 72.0 in | Wt 193.1 lb

## 2014-05-19 DIAGNOSIS — E785 Hyperlipidemia, unspecified: Secondary | ICD-10-CM

## 2014-05-19 DIAGNOSIS — I25812 Atherosclerosis of bypass graft of coronary artery of transplanted heart without angina pectoris: Secondary | ICD-10-CM

## 2014-05-19 DIAGNOSIS — I10 Essential (primary) hypertension: Secondary | ICD-10-CM

## 2014-05-19 MED ORDER — ASPIRIN EC 81 MG PO TBEC: 81.0000 mg | DELAYED_RELEASE_TABLET | Freq: Every day | ORAL | Status: DC

## 2014-05-19 NOTE — Progress Notes (Signed)
Background: The patient is followed for coronary artery disease status post remote CABG in 1992. He has a chronic right bundle branch block. The patient is followed by Dr. 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 He has been managed conservatively with medical therapy.  HPI:  80 year old gentleman presenting for follow-up evaluation. He was last seen in September 2014. Patient has no specific cardiac complaints. He denies chest pain or pressure. He has mild shortness of breath with activity but this is unchanged. No orthopnea, PND, lightheadedness, or syncope. He does complain of chronic gait instability.  Studies:  Myoview stress test 05/08/2013: Impression  Exercise Capacity: Adenosine study with no exercise.  BP Response: Normal blood pressure response.  Clinical Symptoms: Short of breath, flushed.  ECG Impression: Baseline: LBBB. EKG uninterpretable due to LBBB at rest and stress.  Comparison with Prior Nuclear Study: No significant change from previous study  Overall Impression: Low risk stress nuclear study. There is a primarily fixed basal to mid inferior and inferolateral perfusion defect, medium-sized and moderate in intensity. Inferolateral hypokinesis. .  LV Ejection Fraction: 48%. LV Wall Motion: Inferolateral hypokinesis.  Jesse [name]  05/08/2013   Outpatient Encounter Prescriptions as of 05/19/2014  Medication Sig  . acetaminophen (TYLENOL) 500 MG tablet Take 500 mg by mouth every 6 (six) hours as needed.    . carvedilol (COREG) 12.5 MG tablet Take 1 tablet (12.5 mg total) by mouth 2 (two) times daily.  . fenofibrate micronized (LOFIBRA) 200 MG capsule Take 1 capsule (200 mg total) by mouth daily before breakfast.  . hydrochlorothiazide (HYDRODIURIL) 12.5 MG tablet Take 1 tablet (12.5 mg total) by mouth daily.  Marland Kitchen loratadine (CLARITIN) 10 MG tablet Take 10 mg by mouth daily.   Marland Kitchen losartan (COZAAR) 50 MG tablet Take 1 tablet (50  mg total) by mouth daily.  Jesse [name] Jesse [name] (SYSTANE OP) Place 1 drop into both eyes 2 (two) times daily.  . Polyvinyl Alcohol-Povidone (REFRESH OP) Apply 1 drop to eye 3 (three) times daily as needed. For dry eyes  . simvastatin (ZOCOR) 40 MG tablet Take 1 tablet (40 mg total) by mouth at bedtime.  Marland Kitchen aspirin EC 81 MG tablet Take 1 tablet (81 mg total) by mouth daily.    Allergies  Allergen Reactions  . Adhesive [Tape]     Rash and itching    Past Medical History  Diagnosis Date  . Coronary artery disease   . Hyperlipidemia   . Hypertension   . AAA (abdominal aortic aneurysm)   . Arthritis   . Myocardial infarction 1992  . Pneumonia     hx of  . Bronchitis     hx of appro 40 years ago  . Kidney stone 1968  . Cancer 2010    prostate ( 40 Txs. of radiation treatments)    family history is not on file.   ROS: Negative except as per HPI  BP 130/68  Pulse 60  Ht 6' (1.829 m)  Wt 193 lb 1.9 oz (87.599 kg)  BMI 26.19 kg/m2  PHYSICAL EXAM: Pt is alert and oriented, pleasant elderly male in NAD HEENT: normal Neck: JVP - normal, carotids 2+= with soft bilateral bruits Lungs: CTA bilaterally CV: RRR without murmur or gallop Abd: soft, NT, Positive BS, no hepatomegaly Ext: no C/C/E Skin: warm/dry no rash  EKG:  Normal sinus rhythm 60 bpm, right bundle branch block, possible age-indeterminate inferior infarct, left axis deviation.  ASSESSMENT AND PLAN: 1. Coronary  artery disease, native vessel, without anginal symptoms. Nuclear stress test last year demonstrated no ischemia. Medications reviewed. Advised that he start aspirin 81 mg daily.  2. Hyperlipidemia. Last year's lipid panel reviewed. Will repeat lab work when he is fasting. He continues on a combination of fenofibrate and simvastatin.  3. Essential hypertension. Blood pressure is controlled on carvedilol, hydrochlorothiazide, and losartan.  4. Diffuse aneurysmal disease. Followed by vascular  surgery.  For follow-up I will see him back in one year unless problems arise.  Jesse Mocha, MD 05/19/2014 9:08 AM

## 2014-05-19 NOTE — Patient Instructions (Addendum)
Your physician recommends that you return for a FASTING LIPID and LIVER profile--nothing to eat or drink after midnight, lab opens at 7:30 AM  Your physician wants you to follow-up in: 1 YEAR with Dr Burt Knack.  You will receive a reminder letter in the mail two months in advance. If you don't receive a letter, please call our office to schedule the follow-up appointment.  Your physician has recommended you make the following change in your medication: START Aspirin 81mg  take one by mouth daily

## 2014-05-20 ENCOUNTER — Other Ambulatory Visit (INDEPENDENT_AMBULATORY_CARE_PROVIDER_SITE_OTHER): Payer: Medicare Other

## 2014-05-20 DIAGNOSIS — I25812 Atherosclerosis of bypass graft of coronary artery of transplanted heart without angina pectoris: Secondary | ICD-10-CM

## 2014-05-20 DIAGNOSIS — I10 Essential (primary) hypertension: Secondary | ICD-10-CM

## 2014-05-20 DIAGNOSIS — E785 Hyperlipidemia, unspecified: Secondary | ICD-10-CM

## 2014-05-20 LAB — HEPATIC FUNCTION PANEL
ALK PHOS: 41 U/L (ref 39–117)
ALT: 16 U/L (ref 0–53)
AST: 16 U/L (ref 0–37)
Albumin: 3.4 g/dL — ABNORMAL LOW (ref 3.5–5.2)
BILIRUBIN DIRECT: 0.2 mg/dL (ref 0.0–0.3)
BILIRUBIN TOTAL: 1.1 mg/dL (ref 0.2–1.2)
Total Protein: 6.4 g/dL (ref 6.0–8.3)

## 2014-05-20 LAB — LIPID PANEL
CHOL/HDL RATIO: 4
Cholesterol: 123 mg/dL (ref 0–200)
HDL: 28.9 mg/dL — ABNORMAL LOW (ref >39.00)
LDL CALC: 81 mg/dL (ref 0–99)
NonHDL: 94.1
TRIGLYCERIDES: 64 mg/dL (ref 0.0–149.0)
VLDL: 12.8 mg/dL (ref 0.0–40.0)

## 2014-06-02 ENCOUNTER — Other Ambulatory Visit: Payer: Self-pay | Admitting: *Deleted

## 2014-06-02 MED ORDER — LOSARTAN POTASSIUM 50 MG PO TABS: 50.0000 mg | ORAL_TABLET | Freq: Every day | ORAL | Status: DC

## 2014-06-09 ENCOUNTER — Other Ambulatory Visit: Payer: Self-pay | Admitting: *Deleted

## 2014-06-09 MED ORDER — SIMVASTATIN 40 MG PO TABS: 40.0000 mg | ORAL_TABLET | Freq: Every day | ORAL | Status: DC

## 2014-07-09 ENCOUNTER — Other Ambulatory Visit: Payer: Self-pay | Admitting: Cardiovascular Disease

## 2014-07-23 ENCOUNTER — Ambulatory Visit: Payer: Medicare Other | Admitting: Family

## 2014-07-23 ENCOUNTER — Other Ambulatory Visit (HOSPITAL_COMMUNITY): Payer: Medicare Other

## 2014-08-28 ENCOUNTER — Other Ambulatory Visit: Payer: Self-pay | Admitting: Cardiovascular Disease

## 2014-09-10 ENCOUNTER — Other Ambulatory Visit: Payer: Self-pay | Admitting: Cardiovascular Disease

## 2014-11-11 ENCOUNTER — Telehealth: Payer: Self-pay

## 2014-11-11 NOTE — Telephone Encounter (Signed)
Will ask for PharmD input. Lipids/LFT's have been in good range on combination of simvastatin/Lofibra except for low HDL.

## 2014-11-12 MED ORDER — FENOFIBRATE 160 MG PO TABS: 160.0000 mg | ORAL_TABLET | Freq: Every day | ORAL | Status: DC

## 2014-11-12 NOTE — Telephone Encounter (Signed)
LMOM for pt.  Will change to fenofibrate 160mg  daily with food as this is usually cheaper than some of the other formulations.  New Rx sent for patient.

## 2015-03-04 ENCOUNTER — Other Ambulatory Visit: Payer: Self-pay | Admitting: Gastroenterology

## 2015-04-24 ENCOUNTER — Encounter: Payer: Self-pay | Admitting: Vascular Surgery

## 2015-04-28 ENCOUNTER — Ambulatory Visit (INDEPENDENT_AMBULATORY_CARE_PROVIDER_SITE_OTHER): Payer: Medicare Other | Admitting: Vascular Surgery

## 2015-04-28 ENCOUNTER — Ambulatory Visit (HOSPITAL_COMMUNITY)
Admission: RE | Admit: 2015-04-28 | Discharge: 2015-04-28 | Disposition: A | Discharge status: Home / Self Care | Payer: Medicare Other | Source: Ambulatory Visit | Attending: Vascular Surgery | Admitting: Vascular Surgery

## 2015-04-28 ENCOUNTER — Ambulatory Visit (INDEPENDENT_AMBULATORY_CARE_PROVIDER_SITE_OTHER)
Admission: RE | Admit: 2015-04-28 | Discharge: 2015-04-28 | Disposition: A | Discharge status: Home / Self Care | Payer: Medicare Other | Source: Ambulatory Visit | Attending: Vascular Surgery | Admitting: Vascular Surgery

## 2015-04-28 ENCOUNTER — Other Ambulatory Visit: Payer: Self-pay | Admitting: Vascular Surgery

## 2015-04-28 ENCOUNTER — Encounter: Payer: Self-pay | Admitting: Vascular Surgery

## 2015-04-28 VITALS — BP 128/77 | HR 50 | Ht 72.0 in | Wt 192.3 lb

## 2015-04-28 DIAGNOSIS — I714 Abdominal aortic aneurysm, without rupture: Secondary | ICD-10-CM | POA: Diagnosis not present

## 2015-04-28 DIAGNOSIS — Z48812 Encounter for surgical aftercare following surgery on the circulatory system: Secondary | ICD-10-CM | POA: Diagnosis not present

## 2015-04-28 DIAGNOSIS — M7122 Synovial cyst of popliteal space [Baker], left knee: Secondary | ICD-10-CM | POA: Diagnosis not present

## 2015-04-28 DIAGNOSIS — Z95828 Presence of other vascular implants and grafts: Secondary | ICD-10-CM | POA: Diagnosis not present

## 2015-04-28 DIAGNOSIS — I724 Aneurysm of artery of lower extremity: Secondary | ICD-10-CM

## 2015-04-28 NOTE — Progress Notes (Signed)
Here today for follow-up of diffuse aneurysmal disease. He is status post open repair of abdominal aortic aneurysm in 1995 with Dr. [name]. 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 He remains quite active has no claudication type symptoms and was had no new major medical problems.  Past Medical History  Diagnosis Date  . Coronary artery disease   . Hyperlipidemia   . Hypertension   . AAA (abdominal aortic aneurysm) ([place])   . Arthritis   . Myocardial infarction ([place]) 1992  . Pneumonia     hx of  . Bronchitis     hx of appro 40 years ago  . Kidney stone 1968  . Cancer ([place]) 2010    prostate ( 40 Txs. of radiation treatments)    Social History  Substance Use Topics  . Smoking status: Former Smoker -- 20 years    Types: Cigarettes    Quit date: 07/25/1968  . Smokeless tobacco: Never Used  . Alcohol Use: No    Family History  Problem Relation Age of Onset  . Cancer Mother   . Cancer Father   . Varicose Veins Father   . Cancer Sister   . Diabetes Sister   . Heart attack Sister     Allergies  Allergen Reactions  . Adhesive [Tape]     Rash and itching     Current outpatient prescriptions:  .  acetaminophen (TYLENOL) 500 MG tablet, Take 500 mg by mouth every 6 (six) hours as needed.  , Disp: , Rfl:  .  Alum Hydroxide-Mag Carbonate (GAVISCON PO), Take by mouth., Disp: , Rfl:  .  aspirin EC 81 MG tablet, Take 1 tablet (81 mg total) by mouth daily., Disp: , Rfl:  .  carvedilol (COREG) 12.5 MG tablet, TAKE 1 BY MOUTH TWICE DAILY, Disp: 180 tablet, Rfl: 4 .  fenofibrate 160 MG tablet, Take 1 tablet (160 mg total) by mouth daily., Disp: 90 tablet, Rfl: 2 .  hydrochlorothiazide (HYDRODIURIL) 12.5 MG tablet, TAKE 1 BY MOUTH DAILY, Disp: 90 tablet, Rfl: 2 .  Lactase (DAIRY DIGESTIVE SUPPLEMENT PO), Take by mouth., Disp: , Rfl:  .  loratadine (CLARITIN) 10 MG tablet, Take 10 mg by mouth  daily. , Disp: , Rfl:  .  losartan (COZAAR) 50 MG tablet, Take 1 tablet (50 mg total) by mouth daily., Disp: 90 tablet, Rfl: 3 .  Polyethyl Glycol-Propyl Glycol (SYSTANE OP), Place 1 drop into both eyes 2 (two) times daily., Disp: , Rfl:  .  Polyvinyl Alcohol-Povidone (REFRESH OP), Apply 1 drop to eye 3 (three) times daily as needed. For dry eyes, Disp: , Rfl:  .  simvastatin (ZOCOR) 40 MG tablet, Take 1 tablet (40 mg total) by mouth at bedtime., Disp: 90 tablet, Rfl: 3  Filed Vitals:   04/28/15 0940  BP: 128/77  Pulse: 50  Height: 6' (1.829 m)  Weight: 192 lb 4.8 oz (87.227 kg)    Body mass index is 26.07 kg/(m^2).       Physical exam well-developed well-nourished gentleman appearing younger than stated age of 76. 2+ radial 2+ femoral 3+ popliteal and palpable posterior tibial pulses bilaterally. Abdomen soft nontender Neurologically he is grossly intact  He underwent duplex today of his femoral and popliteal arteries. This shows no change from his last exam with the duplex on 07/17/2013. Also compare this to his CT scan of his aorta iliac and lower extremities from a  year ago. He does have a maximal diameter of his right popliteal at 1.8 cm and left popliteal of 3.2 cm. This compares to a CT of the 3.4 cm one year ago  Impression and plan diffuse aneurysms which are asymptomatic. Again had long discussion with patient explaining the significance of this. Difficult to know if that any of these would benefit from elective repair since he has diffuse aneurysmal disease. His left popliteal is most concerning fortunately has had no change in a year. I did explain that he still could suffer thrombosis of this. In reviewing his CT scan from one year ago he has very large aneurysm throughout the thigh of the superficial femoral artery with some ectasia of his artery below this. Explained this should be less risky than normal artery with a large 3 cm aneurysm. He states that he turns 87 later  this week and would prefer continued observation. I feel this is appropriate. I feel that he is at some risk of thrombosis of this. I did explain symptoms he knows to report immediately to the emergency room should he develop any ischemic symptoms in his right or left leg. Otherwise we will see him in one year with repeat imaging

## 2015-05-12 ENCOUNTER — Ambulatory Visit (HOSPITAL_COMMUNITY): Admission: RE | Admit: 2015-05-12 | Payer: Self-pay | Source: Ambulatory Visit | Admitting: Gastroenterology

## 2015-05-12 ENCOUNTER — Encounter (HOSPITAL_COMMUNITY): Admission: RE | Payer: Self-pay | Source: Ambulatory Visit

## 2015-05-12 SURGERY — COLONOSCOPY WITH PROPOFOL
Anesthesia: Monitor Anesthesia Care

## 2015-05-18 ENCOUNTER — Other Ambulatory Visit: Payer: Self-pay | Admitting: Cardiovascular Disease

## 2015-05-27 ENCOUNTER — Emergency Department (HOSPITAL_COMMUNITY)
Admission: EM | Admit: 2015-05-27 | Discharge: 2015-05-28 | Disposition: A | Discharge status: Home / Self Care | Payer: Medicare Other | Attending: Emergency Medicine | Admitting: Emergency Medicine

## 2015-05-27 DIAGNOSIS — M791 Myalgia: Secondary | ICD-10-CM | POA: Diagnosis not present

## 2015-05-27 DIAGNOSIS — R109 Unspecified abdominal pain: Secondary | ICD-10-CM | POA: Insufficient documentation

## 2015-05-27 DIAGNOSIS — K5900 Constipation, unspecified: Secondary | ICD-10-CM | POA: Insufficient documentation

## 2015-05-27 DIAGNOSIS — Z951 Presence of aortocoronary bypass graft: Secondary | ICD-10-CM | POA: Insufficient documentation

## 2015-05-27 DIAGNOSIS — I2510 Atherosclerotic heart disease of native coronary artery without angina pectoris: Secondary | ICD-10-CM | POA: Insufficient documentation

## 2015-05-27 DIAGNOSIS — Z8709 Personal history of other diseases of the respiratory system: Secondary | ICD-10-CM | POA: Diagnosis not present

## 2015-05-27 DIAGNOSIS — Z79899 Other long term (current) drug therapy: Secondary | ICD-10-CM | POA: Insufficient documentation

## 2015-05-27 DIAGNOSIS — I252 Old myocardial infarction: Secondary | ICD-10-CM | POA: Diagnosis not present

## 2015-05-27 DIAGNOSIS — Z9889 Other specified postprocedural states: Secondary | ICD-10-CM | POA: Diagnosis not present

## 2015-05-27 DIAGNOSIS — Z7982 Long term (current) use of aspirin: Secondary | ICD-10-CM | POA: Diagnosis not present

## 2015-05-27 DIAGNOSIS — R1084 Generalized abdominal pain: Secondary | ICD-10-CM | POA: Diagnosis present

## 2015-05-27 DIAGNOSIS — Z87891 Personal history of nicotine dependence: Secondary | ICD-10-CM | POA: Insufficient documentation

## 2015-05-27 DIAGNOSIS — E785 Hyperlipidemia, unspecified: Secondary | ICD-10-CM | POA: Diagnosis not present

## 2015-05-27 DIAGNOSIS — Z8546 Personal history of malignant neoplasm of prostate: Secondary | ICD-10-CM | POA: Insufficient documentation

## 2015-05-27 DIAGNOSIS — R52 Pain, unspecified: Secondary | ICD-10-CM

## 2015-05-27 DIAGNOSIS — Z87442 Personal history of urinary calculi: Secondary | ICD-10-CM | POA: Diagnosis not present

## 2015-05-27 DIAGNOSIS — Z8701 Personal history of pneumonia (recurrent): Secondary | ICD-10-CM | POA: Insufficient documentation

## 2015-05-27 DIAGNOSIS — I10 Essential (primary) hypertension: Secondary | ICD-10-CM | POA: Diagnosis not present

## 2015-05-27 NOTE — ED Notes (Signed)
Pt states that he has had abdominal pain today. Belching. Hx of gallstones but also AAA. Alert and oriented. N/V.

## 2015-05-28 ENCOUNTER — Emergency Department (HOSPITAL_COMMUNITY): Payer: Medicare Other

## 2015-05-28 ENCOUNTER — Encounter (HOSPITAL_COMMUNITY): Payer: Self-pay | Admitting: Emergency Medicine

## 2015-05-28 LAB — COMPREHENSIVE METABOLIC PANEL
ALBUMIN: 4.2 g/dL (ref 3.5–5.0)
ALT: 12 U/L — AB (ref 17–63)
AST: 16 U/L (ref 15–41)
Alkaline Phosphatase: 45 U/L (ref 38–126)
Anion gap: 6 (ref 5–15)
BUN: 30 mg/dL — AB (ref 6–20)
CHLORIDE: 107 mmol/L (ref 101–111)
CO2: 24 mmol/L (ref 22–32)
Calcium: 9.9 mg/dL (ref 8.9–10.3)
Creatinine, Ser: 1.16 mg/dL (ref 0.61–1.24)
GFR calc Af Amer: >60 mL/min (ref >60)
GFR, EST NON AFRICAN AMERICAN: 56 mL/min — AB (ref >60)
GLUCOSE: 114 mg/dL — AB (ref 65–99)
POTASSIUM: 4 mmol/L (ref 3.5–5.1)
SODIUM: 137 mmol/L (ref 135–145)
Total Bilirubin: 1 mg/dL (ref 0.3–1.2)
Total Protein: 6.5 g/dL (ref 6.5–8.1)

## 2015-05-28 LAB — URINALYSIS, ROUTINE W REFLEX MICROSCOPIC
BILIRUBIN URINE: NEGATIVE
GLUCOSE, UA: NEGATIVE mg/dL
Hgb urine dipstick: NEGATIVE
Ketones, ur: NEGATIVE mg/dL
Leukocytes, UA: NEGATIVE
NITRITE: NEGATIVE
PH: 7.5 (ref 5.0–8.0)
Protein, ur: NEGATIVE mg/dL
SPECIFIC GRAVITY, URINE: 1.019 (ref 1.005–1.030)
Urobilinogen, UA: 2 mg/dL — ABNORMAL HIGH (ref 0.0–1.0)

## 2015-05-28 LAB — CBC
HEMATOCRIT: 39.2 % (ref 39.0–52.0)
Hemoglobin: 13 g/dL (ref 13.0–17.0)
MCH: 29.8 pg (ref 26.0–34.0)
MCHC: 33.2 g/dL (ref 30.0–36.0)
MCV: 89.9 fL (ref 78.0–100.0)
Platelets: 240 10*3/uL (ref 150–400)
RBC: 4.36 MIL/uL (ref 4.22–5.81)
RDW: 13.4 % (ref 11.5–15.5)
WBC: 5.7 10*3/uL (ref 4.0–10.5)

## 2015-05-28 LAB — I-STAT TROPONIN, ED: Troponin i, poc: 0.01 ng/mL (ref 0.00–0.08)

## 2015-05-28 LAB — LIPASE, BLOOD: LIPASE: 48 U/L (ref 11–51)

## 2015-05-28 MED ORDER — GI COCKTAIL ~~LOC~~
30.0000 mL | Freq: Once | ORAL | Status: AC
  Administered 2015-05-28: 30 mL via ORAL
  Filled 2015-05-28: qty 30

## 2015-05-28 MED ORDER — KETOROLAC TROMETHAMINE 60 MG/2ML IM SOLN
60.0000 mg | Freq: Once | INTRAMUSCULAR | Status: AC
  Administered 2015-05-28: 60 mg via INTRAMUSCULAR
  Filled 2015-05-28: qty 2

## 2015-05-28 MED ORDER — DICYCLOMINE HCL 20 MG PO TABS: 20.0000 mg | ORAL_TABLET | Freq: Two times a day (BID) | ORAL | Status: DC

## 2015-05-28 MED ORDER — OMEPRAZOLE 20 MG PO CPDR: 20.0000 mg | DELAYED_RELEASE_CAPSULE | Freq: Every day | ORAL | Status: DC

## 2015-05-28 MED ORDER — DICYCLOMINE HCL 10 MG/ML IM SOLN
20.0000 mg | Freq: Once | INTRAMUSCULAR | Status: AC
  Administered 2015-05-28: 20 mg via INTRAMUSCULAR
  Filled 2015-05-28: qty 2

## 2015-05-28 NOTE — ED Notes (Signed)
Patient transported to CT 

## 2015-05-28 NOTE — ED Provider Notes (Signed)
CSN: [id number]     Arrival date & time 05/27/15  2345 History  By signing my name below, I, Emmanuella Mensah, attest that this documentation has been prepared under the direction and in the presence of Audy Dauphine, MD. Electronically Signed: Judithann Sauger, ED Scribe. 05/28/2015. 1:51 AM.    Chief Complaint  Patient presents with  . Abdominal Pain   Patient is a 81 y.o. male presenting with abdominal pain. The history is provided by the patient. No language interpreter was used.  Abdominal Pain Pain location:  Generalized Pain quality: cramping   Pain radiates to:  Does not radiate Pain severity:  Moderate Onset quality:  Gradual Timing:  Intermittent Progression:  Worsening Chronicity:  Recurrent Context: not diet changes, not suspicious food intake and not trauma   Relieved by:  None tried Worsened by:  Nothing tried Ineffective treatments:  None tried Associated symptoms: constipation   Associated symptoms: no chest pain, no dysuria, no fever and no shortness of breath   Risk factors: no alcohol abuse    HPI Comments: Jesse [name] is a 81 y.o. male with ah hx of HLD, HTN, AAA repair, kidney stone, prostate cancer, and repair of ventral hernia who presents to the Emergency Department complaining of gradually worsening intermittent generalized abdominal pain that occurs approx once a week onset 7 months ago. He reports associated generalized myalgias, belching, and intermittent constipation. He denies any burning while urinating or frequency. He explains that at time of onset, he attributed his abdominal pain to his lactose intolerance after ingesting milk products. He denies that his PCP has performed any blood work, x-rays, ultrasounds, or scans to evaluate these symptoms. He adds that he has not seen his PCP since June 2016 when she referred him to the ER for a scan for further evaluation. Pt states that he refused the scan at that time. He reports that his last colonoscopy was 5  years ago and when he went to his GI in September, his GI did not perform a colonoscopy due to his age.   Past Medical History  Diagnosis Date  . Coronary artery disease   . Hyperlipidemia   . Hypertension   . AAA (abdominal aortic aneurysm) ([employer])   . Arthritis   . Myocardial infarction ([place]) 1992  . Pneumonia     hx of  . Bronchitis     hx of appro 40 years ago  . Kidney stone 1968  . Cancer ([place]) 2010    prostate ( 40 Txs. of radiation treatments)   Past Surgical History  Procedure Laterality Date  . Repair of ventral hernia  04-20-1994    P. Cameron Sprang MD  . Abdominal aortic aneurysm repair  08-13-1993    Dr Cameron Sprang  . Coronary artery bypass graft  03-01-1991    Dr. Servando Snare  . Repair of bowel obstruction  1999    Dr. Dalbert Batman  . Eye surgery      Detached retina (Left eye); bilateral cataract removal  . Colonoscopy w/ polypectomy    . Hernia repair    . Resection femoral artery  03/19/2012    Dr. [name] Early  . False aneurysm repair  05/07/2012    Procedure: REPAIR FALSE ANEURYSM;  Surgeon: Rosetta Posner, MD;  Location: [facility] OR;  Service: Vascular;  Laterality: Left;  Repair of Left Femoral Artery Aneurysm   Family History  Problem Relation Age of Onset  . Cancer Mother   . Cancer Father   . Varicose  Veins Father   . Cancer Sister   . Diabetes Sister   . Heart attack Sister    Social History  Substance Use Topics  . Smoking status: Former Smoker -- 20 years    Types: Cigarettes    Quit date: 07/25/1968  . Smokeless tobacco: Never Used  . Alcohol Use: No    Review of Systems  Constitutional: Negative for fever.  Respiratory: Negative for shortness of breath.   Cardiovascular: Negative for chest pain.  Gastrointestinal: Positive for abdominal pain and constipation.  Genitourinary: Negative for dysuria and frequency.  Musculoskeletal: Positive for myalgias.  All other systems reviewed and are negative.     Allergies  Adhesive  Home Medications    Prior to Admission medications   Medication Sig Start Date End Date Taking? Authorizing Provider  acetaminophen (TYLENOL) 500 MG tablet Take 500 mg by mouth every 6 (six) hours as needed.      Historical Provider, MD  Alum Hydroxide-Mag Carbonate (GAVISCON PO) Take by mouth.    Historical Provider, MD  aspirin EC 81 MG tablet Take 1 tablet (81 mg total) by mouth daily. 05/19/14   [name], MD  carvedilol (COREG) 12.5 MG tablet TAKE 1 BY MOUTH TWICE DAILY 08/29/14   [name], MD  fenofibrate 160 MG tablet Take 1 tablet (160 mg total) by mouth daily. 11/12/14   [name], MD  hydrochlorothiazide (HYDRODIURIL) 12.5 MG tablet TAKE 1 BY MOUTH DAILY 05/19/15   [name], MD  Lactase (DAIRY DIGESTIVE SUPPLEMENT PO) Take by mouth.    Historical Provider, MD  loratadine (CLARITIN) 10 MG tablet Take 10 mg by mouth daily.     Historical Provider, MD  losartan (COZAAR) 50 MG tablet TAKE 1 BY MOUTH DAILY 05/19/15   [name], MD  Polyethyl Glycol-Propyl Glycol (SYSTANE OP) Place 1 drop into both eyes 2 (two) times daily.    Historical Provider, MD  Polyvinyl Alcohol-Povidone (REFRESH OP) Apply 1 drop to eye 3 (three) times daily as needed. For dry eyes    Historical Provider, MD  simvastatin (ZOCOR) 40 MG tablet TAKE 1 BY MOUTH AT BEDTIME 05/19/15   [name], MD   BP 121/75 mmHg  Pulse 62  Temp(Src) 98.4 F (36.9 C) (Oral)  SpO2 98% Physical Exam  Constitutional: He is oriented to person, place, and time. He appears well-developed and well-nourished. No distress.  HENT:  Head: Normocephalic and atraumatic.  Mouth/Throat: Oropharynx is clear and moist. No oropharyngeal exudate.  Eyes: Conjunctivae and EOM are normal. Pupils are equal, round, and reactive to light.  Neck: Normal range of motion. Neck supple. No tracheal deviation present.  Cardiovascular: Normal rate and regular rhythm.   Pulmonary/Chest: Effort normal and breath sounds normal. No stridor. No  respiratory distress. He has no wheezes. He has no rales.  Abdominal: Soft. He exhibits no ascites and no mass. There is no tenderness. There is no rigidity, no rebound, no guarding, no tenderness at McBurney's point and negative Murphy's sign.  Hyperactive bowel sounds Gassy throughout  Musculoskeletal: Normal range of motion. He exhibits no edema or tenderness.  Neurological: He is alert and oriented to person, place, and time. He has normal reflexes.  Intact DTRs  Skin: Skin is warm and dry.  Psychiatric: He has a normal mood and affect. His behavior is normal.  Nursing note and vitals reviewed.   ED Course  Procedures (including critical care time) DIAGNOSTIC STUDIES: Oxygen Saturation is 97% on RA, adequate by my interpretation.  COORDINATION OF CARE: 12:46 AM- Pt advised of plan for treatment and pt agrees.  1:50 AM - Pt advised to follow up with his vascular specialist.    Labs Review Labs Reviewed  COMPREHENSIVE METABOLIC PANEL - Abnormal; Notable for the following:    Glucose, Bld 114 (*)    BUN 30 (*)    ALT 12 (*)    GFR calc non Af Amer 56 (*)    All other components within normal limits  LIPASE, BLOOD  CBC  URINALYSIS, ROUTINE W REFLEX MICROSCOPIC (NOT AT [facility])  I-STAT TROPOININ, ED    Imaging Review No results found. Cincere Deprey, MD has personally reviewed and evaluated these images and lab results as part of her medical decision-making.   EKG Interpretation   Date/Time:  Thursday May 28 2015 00:05:15 EDT Ventricular Rate:  58 PR Interval:  200 QRS Duration: 151 QT Interval:  466 QTC Calculation: 458 R Axis:   -61 Text Interpretation:  Sinus rhythm Left ventricular hypertrophy Inferior  infarct, old Probable anterior infarct, old First degree A-V block  Confirmed by [facility]  MD, Chasitee Zenker ([id number]) on 05/28/2015 12:46:12 AM      EKG Interpretation  Date/Time:  Thursday May 28 2015 00:05:15 EDT Ventricular Rate:  58 PR  Interval:  200 QRS Duration: 151 QT Interval:  466 QTC Calculation: 458 R Axis:   -61 Text Interpretation:  Sinus rhythm Left ventricular hypertrophy Inferior infarct, old Probable anterior infarct, old First degree A-V block Confirmed by [facility]  MD, Polk Minor ([id number]) on 05/28/2015 12:46:12 AM       MDM   Final diagnoses:  Pain   Even though this is not the reason for today's visit, patient Advised to follow up with Dr. [name] Early for his care of his ongoing iliac artery aneurysms seen on CT scan, Slight enlargement from previous.  Suspect gas as source of ongoing pain  Of 7 months duration and perhaps 17 years based on patient's revised statement.  There are no non verbal signs of pain. All labs and imaging are unremarkable.  Follow up with Dr. Sabra Heck for ongoing care of your ongoing abdominal cramping. Verbalized to patient and patient agrees.    I personally performed the services described in this documentation, which was scribed in my presence. The recorded information has been reviewed and is accurate.    Veatrice Kells, MD 05/28/15 [phone]

## 2015-06-01 ENCOUNTER — Emergency Department (HOSPITAL_COMMUNITY)
Admission: EM | Admit: 2015-06-01 | Discharge: 2015-06-02 | Disposition: A | Discharge status: Home / Self Care | Payer: Medicare Other | Attending: Emergency Medicine | Admitting: Emergency Medicine

## 2015-06-01 ENCOUNTER — Emergency Department (HOSPITAL_COMMUNITY): Payer: Medicare Other

## 2015-06-01 ENCOUNTER — Encounter (HOSPITAL_COMMUNITY): Payer: Self-pay | Admitting: *Deleted

## 2015-06-01 DIAGNOSIS — W010XXA Fall on same level from slipping, tripping and stumbling without subsequent striking against object, initial encounter: Secondary | ICD-10-CM | POA: Insufficient documentation

## 2015-06-01 DIAGNOSIS — E785 Hyperlipidemia, unspecified: Secondary | ICD-10-CM | POA: Diagnosis not present

## 2015-06-01 DIAGNOSIS — M1990 Unspecified osteoarthritis, unspecified site: Secondary | ICD-10-CM | POA: Diagnosis not present

## 2015-06-01 DIAGNOSIS — Y998 Other external cause status: Secondary | ICD-10-CM | POA: Insufficient documentation

## 2015-06-01 DIAGNOSIS — Z8546 Personal history of malignant neoplasm of prostate: Secondary | ICD-10-CM | POA: Diagnosis not present

## 2015-06-01 DIAGNOSIS — Z7982 Long term (current) use of aspirin: Secondary | ICD-10-CM | POA: Diagnosis not present

## 2015-06-01 DIAGNOSIS — I2510 Atherosclerotic heart disease of native coronary artery without angina pectoris: Secondary | ICD-10-CM | POA: Diagnosis not present

## 2015-06-01 DIAGNOSIS — Z8709 Personal history of other diseases of the respiratory system: Secondary | ICD-10-CM | POA: Insufficient documentation

## 2015-06-01 DIAGNOSIS — Z79899 Other long term (current) drug therapy: Secondary | ICD-10-CM | POA: Diagnosis not present

## 2015-06-01 DIAGNOSIS — I252 Old myocardial infarction: Secondary | ICD-10-CM | POA: Diagnosis not present

## 2015-06-01 DIAGNOSIS — Y9301 Activity, walking, marching and hiking: Secondary | ICD-10-CM | POA: Insufficient documentation

## 2015-06-01 DIAGNOSIS — Y92481 Parking lot as the place of occurrence of the external cause: Secondary | ICD-10-CM | POA: Diagnosis not present

## 2015-06-01 DIAGNOSIS — I10 Essential (primary) hypertension: Secondary | ICD-10-CM | POA: Insufficient documentation

## 2015-06-01 DIAGNOSIS — Z87891 Personal history of nicotine dependence: Secondary | ICD-10-CM | POA: Diagnosis not present

## 2015-06-01 DIAGNOSIS — S82091A Other fracture of right patella, initial encounter for closed fracture: Secondary | ICD-10-CM | POA: Insufficient documentation

## 2015-06-01 DIAGNOSIS — Z87442 Personal history of urinary calculi: Secondary | ICD-10-CM | POA: Diagnosis not present

## 2015-06-01 DIAGNOSIS — Z951 Presence of aortocoronary bypass graft: Secondary | ICD-10-CM | POA: Insufficient documentation

## 2015-06-01 DIAGNOSIS — S8991XA Unspecified injury of right lower leg, initial encounter: Secondary | ICD-10-CM | POA: Diagnosis present

## 2015-06-01 DIAGNOSIS — Z8701 Personal history of pneumonia (recurrent): Secondary | ICD-10-CM | POA: Diagnosis not present

## 2015-06-01 DIAGNOSIS — S82001A Unspecified fracture of right patella, initial encounter for closed fracture: Secondary | ICD-10-CM

## 2015-06-01 MED ORDER — OXYCODONE-ACETAMINOPHEN 5-325 MG PO TABS
1.0000 | ORAL_TABLET | Freq: Once | ORAL | Status: AC
  Administered 2015-06-01: 1 via ORAL
  Filled 2015-06-01: qty 1

## 2015-06-01 NOTE — ED Provider Notes (Signed)
CSN: [id number]     Arrival date & time 06/01/15  2027 History   First MD Initiated Contact with Patient 06/01/15 2212     No chief complaint on file.  HPI  Jesse [name] is an 81 year old male with PMHx of CAD, HTN, AAA and MI presenting after a fall. The patient was walking through a parking lot when he tripped over a parking block. He fell forward and landed on his right knee. He reports immediate, severe pain to the right knee which increases with weightbearing and movement. He denies numbness, tingling or weakness of the affected extremity. He denies any other injuries sustained in the fall. Denies hitting his head or loss of consciousness. Denies dizziness, lightheadedness, chest pain or SOB immediately before or after the fall. His son is at bedside and reports that his father lives alone. His son reports that his father is at his baseline.   Past Medical History  Diagnosis Date  . Coronary artery disease   . Hyperlipidemia   . Hypertension   . AAA (abdominal aortic aneurysm) ([place])   . Arthritis   . Myocardial infarction ([place]) 1992  . Pneumonia     hx of  . Bronchitis     hx of appro 40 years ago  . Kidney stone 1968  . Cancer ([place]) 2010    prostate ( 40 Txs. of radiation treatments)   Past Surgical History  Procedure Laterality Date  . Repair of ventral hernia  04-20-1994    P. Cameron Sprang MD  . Abdominal aortic aneurysm repair  08-13-1993    Dr Cameron Sprang  . Coronary artery bypass graft  03-01-1991    Dr. Servando Snare  . Repair of bowel obstruction  1999    Dr. Dalbert Batman  . Eye surgery      Detached retina (Left eye); bilateral cataract removal  . Colonoscopy w/ polypectomy    . Hernia repair    . Resection femoral artery  03/19/2012    Dr. [name] Early  . False aneurysm repair  05/07/2012    Procedure: REPAIR FALSE ANEURYSM;  Surgeon: Rosetta Posner, MD;  Location: [facility] OR;  Service: Vascular;  Laterality: Left;  Repair of Left Femoral Artery Aneurysm   Family History   Problem Relation Age of Onset  . Cancer Mother   . Cancer Father   . Varicose Veins Father   . Cancer Sister   . Diabetes Sister   . Heart attack Sister    Social History  Substance Use Topics  . Smoking status: Former Smoker -- 20 years    Types: Cigarettes    Quit date: 07/25/1968  . Smokeless tobacco: Never Used  . Alcohol Use: No    Review of Systems  Constitutional: Negative for diaphoresis.  Eyes: Negative for visual disturbance.  Respiratory: Negative for shortness of breath.   Cardiovascular: Negative for chest pain.  Gastrointestinal: Negative for nausea and vomiting.  Musculoskeletal: Positive for joint swelling, arthralgias and gait problem. Negative for back pain.  Skin: Negative for wound.  Neurological: Negative for dizziness, syncope, weakness, numbness and headaches.  Hematological: Does not bruise/bleed easily.  All other systems reviewed and are negative.     Allergies  Adhesive and Lactose intolerance (gi)  Home Medications   Prior to Admission medications   Medication Sig Start Date End Date Taking? Authorizing Provider  acetaminophen (TYLENOL) 325 MG tablet Take 325 mg by mouth every 6 (six) hours as needed for moderate pain or headache.  Yes Historical Provider, MD  aspirin EC 81 MG tablet Take 1 tablet (81 mg total) by mouth daily. 05/19/14  Yes [name], MD  carvedilol (COREG) 12.5 MG tablet TAKE 1 BY MOUTH TWICE DAILY 08/29/14  Yes [name], MD  dicyclomine (BENTYL) 20 MG tablet Take 1 tablet (20 mg total) by mouth 2 (two) times daily. 05/28/15  Yes April Palumbo, MD  fenofibrate 160 MG tablet Take 1 tablet (160 mg total) by mouth daily. 11/12/14  Yes [name], MD  hydrochlorothiazide (HYDRODIURIL) 12.5 MG tablet TAKE 1 BY MOUTH DAILY 05/19/15  Yes [name], MD  Lactase (DAIRY DIGESTIVE SUPPLEMENT PO) Take by mouth.   Yes Historical Provider, MD  loratadine (CLARITIN) 10 MG tablet Take 10 mg by mouth daily.    Yes  Historical Provider, MD  losartan (COZAAR) 50 MG tablet TAKE 1 BY MOUTH DAILY 05/19/15  Yes [name], MD  omeprazole (PRILOSEC) 20 MG capsule Take 1 capsule (20 mg total) by mouth daily. 05/28/15  Yes April Palumbo, MD  Polyvinyl Alcohol-Povidone (REFRESH OP) Apply 1 drop to eye 3 (three) times daily as needed. For dry eyes   Yes Historical Provider, MD  Probiotic Product (PROBIOTIC PO) Take 1 capsule by mouth daily.   Yes Historical Provider, MD  simvastatin (ZOCOR) 40 MG tablet TAKE 1 BY MOUTH AT BEDTIME 05/19/15  Yes [name], MD  Alum Hydroxide-Mag Carbonate (GAVISCON PO) Take by mouth.    Historical Provider, MD  oxyCODONE-acetaminophen (PERCOCET/ROXICET) 5-325 MG tablet Take 1 tablet by mouth every 6 (six) hours as needed for severe pain. 06/02/15   Lorece Keach, PA-C   BP 118/72 mmHg  Pulse 68  Temp(Src) 98.4 F (36.9 C) (Oral)  Resp 16  SpO2 96% Physical Exam  Constitutional: He is oriented to person, place, and time. He appears well-developed and well-nourished. No distress.  HENT:  Head: Normocephalic and atraumatic.  Eyes: Conjunctivae are normal. Right eye exhibits no discharge. Left eye exhibits no discharge. No scleral icterus.  Neck: Normal range of motion.  Cardiovascular: Normal rate, regular rhythm and intact distal pulses.   Cap refill < 3. Pedal pulses palpable  Pulmonary/Chest: Effort normal and breath sounds normal. No respiratory distress. He has no wheezes. He has no rales.  Musculoskeletal: He exhibits edema and tenderness.  Tenderness over anterior knee localized over patella. Mild edema of knee. No obvious deformity. Pain with flexion and extension of knee. Pt able to straight leg raise without significant pain. Ankle plantar and dorsi-flexion intact. Toe movement intact.   Neurological: He is alert and oriented to person, place, and time. Coordination normal.  Unable to assess knee strength due to pain. Sensation to light touch intact throughout.    Skin: Skin is warm and dry.  Psychiatric: He has a normal mood and affect. His behavior is normal.  Nursing note and vitals reviewed.   ED Course  Procedures (including critical care time) Labs Review Labs Reviewed - No data to display  Imaging Review Dg Knee Complete 4 Views Right  06/01/2015  CLINICAL DATA:  Severe knee pain swelling bruising for 2 hours after tripping and falling today EXAM: RIGHT KNEE - COMPLETE 4+ VIEW COMPARISON:  None. FINDINGS: Calcification demonstrates known popliteal artery aneurysm. Diffuse osteopenia. Comminuted fracture of the patella with prepatellar soft tissue swelling. Small joint effusion. IMPRESSION: Fracture of the patella. Known popliteal artery aneurysm. Electronically Signed   By: Skipper Cliche M.D.   On: 06/01/2015 21:13   I have personally reviewed and  evaluated these images and lab results as part of my medical decision-making.   EKG Interpretation None      MDM   Final diagnoses:  Patella fracture, right, closed, initial encounter   Patient presenting after a mechanical fall with severe right knee pain. Pain controlled in ED with percocet and ice. Knee xray shows a comminuted patella fracture. Discussed patient with Dr. Lorin Mercy who recommends knee immobilizer and walker. Patient will follow up with Dr. Lorin Mercy outpatient later this week. No walkers available in the emergency department and pt does not use walking aid at home. Attempted to walk patient with crutches but this was largely unsuccessful. Patient is too unstable on crutches to safely be sent home. Son works at an assisted living facility and states that he can get a wheelchair in the morning. The son reports that he or his brother will stay with Jesse [name] once discharged to help with his ADLs until his full recover. I do not feel that it is appropriate to discharge Jesse [name] home without a wheelchair at this time and he will need to stay in ED until son is able to get the wheelchair.  The son states that he could most likely get it around 7 AM. Pt signed out to [facility], PA-C at 2 AM who will observe him overnight and discharge him once son brings the wheelchair. Pt and son agree with this plan.    Josephina Gip, PA-C 06/02/15 [id number]  Debby Freiberg, MD 06/05/15 [phone]

## 2015-06-01 NOTE — ED Notes (Signed)
Pt complains of pain and inability to bear weight on his right leg since an injury tonight around 815PM. Pt states he tripped over a parking block in the parking lot, landing on his right knee.

## 2015-06-02 ENCOUNTER — Encounter (HOSPITAL_COMMUNITY): Payer: Self-pay | Admitting: Emergency Medicine

## 2015-06-02 MED ORDER — OXYCODONE-ACETAMINOPHEN 5-325 MG PO TABS: 1.0000 | ORAL_TABLET | Freq: Four times a day (QID) | ORAL | Status: DC | PRN

## 2015-06-02 MED ORDER — OXYCODONE-ACETAMINOPHEN 5-325 MG PO TABS
1.0000 | ORAL_TABLET | Freq: Once | ORAL | Status: AC
  Administered 2015-06-02: 1 via ORAL
  Filled 2015-06-02: qty 1

## 2015-06-02 NOTE — Discharge Instructions (Signed)
- Call Dr. Lorin Mercy in the morning to schedule a follow up appointment - Keep your knee immobilizer on at all times unless showering. Use your wheelchair when moving around. You may use crutches to help balance you when transferring.  - Percocet may be used for pain control   Patellar Fracture, Adult A patellar fracture is a break in your kneecap (patella).  CAUSES   A direct blow to the knee or a fall is usually the cause of a broken patella.  A very hard and strong bending of your knee can cause a patellar fracture. RISK FACTORS Involvement in contact sports, especially sports that involve a lot of jumping. SIGNS AND SYMPTOMS   Tender and swollen knee.  Pain when you move your knee, especially when you try to straighten out your leg.  Difficulty walking or putting weight on your knee.  Misshapen knee (as if a bone is out of place). DIAGNOSIS  Patellar fracture is usually diagnosed with a physical exam and an X-ray exam. TREATMENT  Treatment depends on the type of fracture:  If your patella is still in the right position after the fracture and you can still straighten your leg out, you can usually be treated with a splint or cast for 4-6 weeks.  If your patella is broken into multiple small pieces but you are able to straighten your leg, you can usually be treated with a splint or cast for 4-6 weeks. Sometimes your patella may need to be removed before the cast is applied.  If you cannot straighten out your leg after a patellar fracture, then surgery is required to hold the bony fragments together until they heal. A cast or splint will be applied for 4-6 weeks. HOME CARE INSTRUCTIONS   Only take over-the-counter or prescription medicines for pain, discomfort, or fever as directed by your health care provider.  Use crutches as directed, and exercise the leg as directed.  Apply ice to the injured area:  Put ice in a plastic bag.  Place a towel between your skin and the  bag.  Leave the ice on for 20 minutes, 2-3 times a day.  Elevate the affected knee above the level of your heart. SEEK MEDICAL CARE IF:  You suspect you have significantly injured your knee.  You hear a pop after a knee injury.  Your knee is misshapen after a knee injury.  You have pain when you move your knee.  You have difficulty walking or putting weight on your knee.  You cannot fully move your knee. SEEK IMMEDIATE MEDICAL CARE IF:  You have redness, swelling, or increasing pain in your knee.  You have a fever.   This information is not intended to replace advice given to you by your health care provider. Make sure you discuss any questions you have with your health care provider.   Document Released: 04/09/2003 Document Revised: 05/01/2013 Document Reviewed: 02/20/2013 Elsevier Interactive Patient Education 2016 [employer].  How to Use a Knee Brace A knee brace is a device that you wear to support your knee, especially if the knee is healing after an injury or surgery. There are several types of knee braces. Some are designed to prevent an injury (prophylactic brace). These are often worn during sports. Others support an injured knee (functional brace) or keep it still while it heals (rehabilitative brace). People with severe arthritis of the knee may benefit from a brace that takes some pressure off the knee (unloader brace). Most knee braces are  made from a combination of cloth and metal or plastic.  You may need to wear a knee brace to:  Relieve knee pain.  Help your knee support your weight (improve stability).  Help you walk farther (improve mobility).  Prevent injury.  Support your knee while it heals from surgery or from an injury. RISKS AND COMPLICATIONS Generally, knee braces are very safe to wear. However, problems may occur, including:  Skin irritation that may lead to infection.  Making your condition worse if you wear the brace in the wrong  way. HOW TO USE A KNEE BRACE Different braces will have different instructions for use. Your health care provider will tell you or show you:  How to put on your brace.  How to adjust the brace.  When and how often to wear the brace.  How to remove the brace.  If you will need any assistive devices in addition to the brace, such as crutches or a cane. In general, your brace should:  Have the hinge of the brace line up with the bend of your knee.  Have straps, hooks, or tapes that fasten snugly around your leg.  Not feel too tight or too loose. HOW TO CARE FOR A KNEE BRACE  Check your brace often for signs of damage, such as loose connections or attachments. Your knee brace may get damaged or wear out during normal use.  Wash the fabric parts of your brace with soap and water.  Read the insert that comes with your brace for other specific care instructions. SEEK MEDICAL CARE IF:  Your knee brace is too loose or too tight and you cannot adjust it.  Your knee brace causes skin redness, swelling, bruising, or irritation.  Your knee brace is not helping.  Your knee brace is making your knee pain worse.   This information is not intended to replace advice given to you by your health care provider. Make sure you discuss any questions you have with your health care provider.   Document Released: 10/01/2003 Document Revised: 04/01/2015 Document Reviewed: 11/03/2014 Elsevier Interactive Patient Education [employer].

## 2015-06-02 NOTE — ED Notes (Signed)
Patient belongings - two hearing aids, glasses, tennis shoes, pants, shirt, belt, jacket.

## 2015-06-02 NOTE — ED Notes (Signed)
Bed: VU13 Expected date:  Expected time:  Means of arrival:  Comments: H-C

## 2015-06-02 NOTE — ED Provider Notes (Signed)
1:56 AM Patient signed out to me by Josephina Gip, PA-C. Patient will be staying in the ED overnight until his son returns at [facility] with a wheelchair to pick him up. Patient has a patella fracture and is unable to use crutches.   Results for orders placed or performed during the hospital encounter of 05/27/15  Lipase, blood  Result Value Ref Range   Lipase 48 11 - 51 U/L  Comprehensive metabolic panel  Result Value Ref Range   Sodium 137 135 - 145 mmol/L   Potassium 4.0 3.5 - 5.1 mmol/L   Chloride 107 101 - 111 mmol/L   CO2 24 22 - 32 mmol/L   Glucose, Bld 114 (H) 65 - 99 mg/dL   BUN 30 (H) 6 - 20 mg/dL   Creatinine, Ser 1.16 0.61 - 1.24 mg/dL   Calcium 9.9 8.9 - 10.3 mg/dL   Total Protein 6.5 6.5 - 8.1 g/dL   Albumin 4.2 3.5 - 5.0 g/dL   AST 16 15 - 41 U/L   ALT 12 (L) 17 - 63 U/L   Alkaline Phosphatase 45 38 - 126 U/L   Total Bilirubin 1.0 0.3 - 1.2 mg/dL   GFR calc non Af Amer 56 (L) >60 mL/min   GFR calc Af Amer >60 >60 mL/min   Anion gap 6 5 - 15  CBC  Result Value Ref Range   WBC 5.7 4.0 - 10.5 K/uL   RBC 4.36 4.22 - 5.81 MIL/uL   Hemoglobin 13.0 13.0 - 17.0 g/dL   HCT 39.2 39.0 - 52.0 %   MCV 89.9 78.0 - 100.0 fL   MCH 29.8 26.0 - 34.0 pg   MCHC 33.2 30.0 - 36.0 g/dL   RDW 13.4 11.5 - 15.5 %   Platelets 240 150 - 400 K/uL  Urinalysis, Routine w reflex microscopic (not at [facility])  Result Value Ref Range   Color, Urine YELLOW YELLOW   APPearance CLOUDY (A) CLEAR   Specific Gravity, Urine 1.019 1.005 - 1.030   pH 7.5 5.0 - 8.0   Glucose, UA NEGATIVE NEGATIVE mg/dL   Hgb urine dipstick NEGATIVE NEGATIVE   Bilirubin Urine NEGATIVE NEGATIVE   Ketones, ur NEGATIVE NEGATIVE mg/dL   Protein, ur NEGATIVE NEGATIVE mg/dL   Urobilinogen, UA 2.0 (H) 0.0 - 1.0 mg/dL   Nitrite NEGATIVE NEGATIVE   Leukocytes, UA NEGATIVE NEGATIVE  I-Stat Troponin, ED (not at [facility])  Result Value Ref Range   Troponin i, poc 0.01 0.00 - 0.08 ng/mL   Comment 3           Dg Knee Complete 4 Views  Right  06/01/2015  CLINICAL DATA:  Severe knee pain swelling bruising for 2 hours after tripping and falling today EXAM: RIGHT KNEE - COMPLETE 4+ VIEW COMPARISON:  None. FINDINGS: Calcification demonstrates known popliteal artery aneurysm. Diffuse osteopenia. Comminuted fracture of the patella with prepatellar soft tissue swelling. Small joint effusion. IMPRESSION: Fracture of the patella. Known popliteal artery aneurysm. Electronically Signed   By: Skipper Cliche M.D.   On: 06/01/2015 21:13   Ct Renal Stone Study  05/28/2015  CLINICAL DATA:  Generalized abdominal pain once a week for 7 months. Malaise, belching, and constipation. Pain is sometimes worse in the right flank area. EXAM: CT ABDOMEN AND PELVIS WITHOUT CONTRAST TECHNIQUE: Multidetector CT imaging of the abdomen and pelvis was performed following the standard protocol without IV contrast. COMPARISON:  03/11/2014 FINDINGS: Patchy areas of tree-in-bud infiltration in both lung bases suggesting bronchiolitis. Coronary artery calcifications.  Cholelithiasis with multiple stones in the gallbladder. No inflammatory infiltration. No bile duct dilatation. Unenhanced appearance of the liver, spleen, pancreas, adrenal glands, inferior vena cava, and retroperitoneal lymph nodes is unremarkable. Calcification of the abdominal aorta. Abdominal aortic dilatation measuring 3.2 cm maximal AP diameter. Narrative bi-iliac bypass grafts. 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 Central low-attenuation in the right kidney measuring 2.9 cm diameter, likely representing a cyst. No hydronephrosis in either kidney. Stomach, small bowel, and colon are not abnormally distended. Diffusely stool-filled colon. No free air or free fluid in the abdomen. Pelvis: There is a left internal iliac artery aneurysm measuring 4 x 4.2 cm diameter. Right internal iliac artery aneurysm measuring 5.6 x 5.8 cm diameter. Both of the aneurysms demonstrate enlargement since  previous study. No evidence of rupture. Prostate gland is not enlarged. Bladder wall is not thickened. No free or loculated pelvic fluid collections. No pelvic mass or lymphadenopathy. Degenerative changes in the spine and hips with mild lumbar scoliosis convex towards the left. No destructive bone lesions. IMPRESSION: Cholelithiasis. Abdominal aortic ectasia. Bilateral iliac artery aneurysms measuring rib 5.8 cm on the right and 4.2 cm on the left, demonstrating enlargement since previous study. Aortobifemoral bypass graft. Coronary artery calcifications. Electronically Signed   By: Lucienne Capers M.D.   On: 05/28/2015 01:33      Alvina Chou, PA-C 06/02/15 [place], MD 06/03/15 [phone]

## 2015-06-02 NOTE — ED Notes (Signed)
Patient d/c'd with family, f/u and medications discussed.  Patient and family verbalized understanding.

## 2015-06-02 NOTE — ED Notes (Signed)
Patient moved from [facility] to room 24 into hospital bed. Patient's son in law will be picking patient up in the AM after he has access to wheelchair.  Patient has unstable gait while attempting to walk with crutches and will be sent home once wheelchair is available.

## 2015-06-05 ENCOUNTER — Other Ambulatory Visit (HOSPITAL_COMMUNITY): Payer: Self-pay | Admitting: Orthopaedic Surgery

## 2015-06-05 NOTE — Pre-Procedure Instructions (Signed)
Jesse [name]  06/05/2015      [facility]MAIL [facility]) [facility] - [address], [place] [address] Phone: [phone] Fax: [phone]  [facility] [address] [place] - [id number] [place] [address] Phone: [phone] Fax: [phone]    Your procedure is scheduled on Tues, Nov 15 @ 7:30 AM  Report to [facility] Admitting at 5:30 AM  Call this number if you have problems the morning of surgery:  [phone]   Remember:  Do not eat food or drink liquids after midnight.  Take these medicines the morning of surgery with A SIP OF WATER Carvedilol(Coreg),Bentyl(Dicyclomine),Claritin(Loratadine),Omeprazole(Prilosec),and Pain Pill(if needed)               Stop taking your Aspirin along with any Vitamins or Herbal Medications. No Goody's,BC's,Aleve,Ibuprofen,Advil,Motrin,and Fish Oil.   Do not wear jewelry.  Do not wear lotions, powders, or colognes.  You may wear deodorant.  Men may shave face and neck.  Do not bring valuables to the hospital.  [facility] is not responsible for any belongings or valuables.  Contacts, dentures or bridgework may not be worn into surgery.  Leave your suitcase in the car.  After surgery it may be brought to your room.  For patients admitted to the hospital, discharge time will be determined by your treatment team.  Patients discharged the day of surgery will not be allowed to drive home.    Special instructions:  [place] - Preparing for Surgery  Before surgery, you can play an important role.  Because skin is not sterile, your skin needs to be as free of germs as possible.  You can reduce the number of germs on you skin by washing with CHG (chlorahexidine gluconate) soap before surgery.  CHG is an antiseptic cleaner which kills germs and bonds with the skin to continue killing germs even after washing.  Please DO NOT use  if you have an allergy to CHG or antibacterial soaps.  If your skin becomes reddened/irritated stop using the CHG and inform your nurse when you arrive at Short Stay.  Do not shave (including legs and underarms) for at least 48 hours prior to the first CHG shower.  You may shave your face.  Please follow these instructions carefully:   1.  Shower with CHG Soap the night before surgery and the                                morning of Surgery.  2.  If you choose to wash your hair, wash your hair first as usual with your       normal shampoo.  3.  After you shampoo, rinse your hair and body thoroughly to remove the                      Shampoo.  4.  Use CHG as you would any other liquid soap.  You can apply chg directly       to the skin and wash gently with scrungie or a clean washcloth.  5.  Apply the CHG Soap to your body ONLY FROM THE NECK DOWN.        Do not use on open wounds or open sores.  Avoid contact with your eyes,       ears, mouth and genitals (private parts).  Wash genitals (private parts)       with your normal soap.  6.  Wash thoroughly, paying special attention to the area where your surgery        will be performed.  7.  Thoroughly rinse your body with warm water from the neck down.  8.  DO NOT shower/wash with your normal soap after using and rinsing off       the CHG Soap.  9.  Pat yourself dry with a clean towel.            10.  Wear clean pajamas.            11.  Place clean sheets on your bed the night of your first shower and do not        sleep with pets.  Day of Surgery  Do not apply any lotions/deoderants the morning of surgery.  Please wear clean clothes to the hospital/surgery center.    Please read over the following fact sheets that you were given. Pain Booklet, Coughing and Deep Breathing and Surgical Site Infection Prevention

## 2015-06-08 ENCOUNTER — Encounter (HOSPITAL_COMMUNITY)
Admission: RE | Admit: 2015-06-08 | Discharge: 2015-06-08 | Disposition: A | Discharge status: Home / Self Care | Payer: Medicare Other | Source: Ambulatory Visit | Attending: Orthopaedic Surgery | Admitting: Orthopaedic Surgery

## 2015-06-08 ENCOUNTER — Encounter (HOSPITAL_COMMUNITY): Payer: Self-pay

## 2015-06-08 HISTORY — DX: Gastro-esophageal reflux disease without esophagitis: K21.9

## 2015-06-08 HISTORY — DX: Unspecified hearing loss, unspecified ear: H91.90

## 2015-06-08 HISTORY — DX: Other allergy status, other than to drugs and biological substances: Z91.09

## 2015-06-08 NOTE — Progress Notes (Signed)
Anesthesia Chart Review:  Pt is 81 year old male scheduled for ORIF R patella on 06/09/2015 with Dr. Lorin Mercy.   Cardiologist is Dr. Burt Knack, last office visit 05/19/14.   PMH includes:  CAD (MI & CABG 1992), HTN, AAA (repaired 1995), hyperlipidemia, prostate cancer, GERD. Hard of hearing. Former smoker. BMI 24. S/p L thigh false aneurysm repair 05/07/12. S/p R femoral endarterectomy 03/19/12.   Medications include: ASA, carvedilol, fenofibrate, hctz, losartan, prilosec, simvastatin.   Labs 05/28/15 reviewed. Pt will need PT/INR DOS.   EKG 05/28/15: Sinus rhythm. LVH. Inferior infarct, old. Probable anterior infarct, old. First degree A-V block.  AAA duplex US 04/28/15: Patent AAA repair with maximum diameter of 2.61cm, mural thrombus noted. Widely patent R limb anastomosis at the R CFA. Limited visualization of the L limb anastomosis.   Nuclear stress test 05/08/13:  - Low risk stress nuclear study. There is a primarily fixed basal to mid inferior and inferolateral perfusion defect, medium-sized and moderate in intensity. Inferolateral hypokinesis. LV Ejection Fraction: 48%. LV Wall Motion: Inferolateral hypokinesis.  If no changes, I anticipate pt can proceed with surgery as scheduled.   Willeen Cass, FNP-BC [facility] Short Stay Surgical [facility]/Anesthesiology Phone: [phone] 06/08/2015 2:21 PM

## 2015-06-08 NOTE — Anesthesia Preprocedure Evaluation (Signed)
Anesthesia Evaluation  Patient identified by MRN, date of birth, ID band Patient awake    Reviewed: Allergy & Precautions, H&P , NPO status , Patient's Chart, lab work & pertinent test results  Airway Mallampati: II  TM Distance: >3 FB Neck ROM: full    Dental  (+) Teeth Intact, Dental Advisory Given, Caps, Implants,    Pulmonary pneumonia, resolved, former smoker,   About 1 week ago had productive cough.  Lungs bilat. Clear, w/ decrease in bases Pulmonary exam normal        Cardiovascular hypertension, Pt. on medications and Pt. on home beta blockers + CAD, + Past MI, + CABG and + Peripheral Vascular Disease   Rhythm:Regular Rate:Normal     Neuro/Psych    GI/Hepatic Neg liver ROS, GERD  ,  Endo/Other  negative endocrine ROS  Renal/GU Renal InsufficiencyRenal disease     Musculoskeletal  (+) Arthritis ,   Abdominal   Peds  Hematology negative hematology ROS (+)   Anesthesia Other Findings Upper Molar bridge  Reproductive/Obstetrics                             Anesthesia Physical  Anesthesia Plan  ASA: III  Anesthesia Plan: General and Regional   Post-op Pain Management:    Induction: Intravenous  Airway Management Planned: LMA  Additional Equipment:   Intra-op Plan:   Post-operative Plan: Extubation in OR  Informed Consent: I have reviewed the patients History and Physical, chart, labs and discussed the procedure including the risks, benefits and alternatives for the proposed anesthesia with the patient or authorized representative who has indicated his/her understanding and acceptance.   Dental advisory given  Plan Discussed with: CRNA and Surgeon  Anesthesia Plan Comments:         Anesthesia Quick Evaluation

## 2015-06-08 NOTE — Progress Notes (Signed)
Nurse called Malachy Mood at Dr. Lorin Mercy office and informed her that orders needed to be signed. Malachy Mood stated that Dr. Lorin Mercy had jury duty today. Therefore consent and INR will be done DOS. Prior lab worked noted in [employer] on 05/28/15.

## 2015-06-08 NOTE — Pre-Procedure Instructions (Signed)
Jesse [name]  06/08/2015        Your procedure is scheduled on Tuesday June 09, 2015 at 7:30 AM  Report to [facility] Admitting at 5:30 AM  Call this number if you have problems the morning of surgery: [phone]   Remember:  Do not eat food or drink liquids after midnight.  Take these medicines the morning of surgery with A SIP OF WATER: Carvedilol (Coreg) Bentyl(Dicyclomine), Claritin(Loratadine), Omeprazole(Prilosec), and Pain Pill (if needed)               Stop taking your Aspirin along with any Vitamins or Herbal Medications. No Goody's,BC's,Aleve,Ibuprofen,Advil,Motrin,and Fish Oil.   Do not wear jewelry.  Do not wear lotions, powders, or colognes.    Men may shave face and neck.  Do not bring valuables to the hospital.  [facility] is not responsible for any belongings or valuables.  Contacts, dentures or bridgework may not be worn into surgery.  Leave your suitcase in the car.  After surgery it may be brought to your room.  For patients admitted to the hospital, discharge time will be determined by your treatment team.  Patients discharged the day of surgery will not be allowed to drive home.   Special instructions: Shower using CHG soap the night before and the morning of your surgery  Please read over the following fact sheets that you were given. Pain Booklet, Coughing and Deep Breathing and Surgical Site Infection Prevention

## 2015-06-08 NOTE — Progress Notes (Signed)
Patient arrived to PAT via wheelchair. Patient denied having any acute cardiac or pulmonary issues.   Patient voiced concern about completing CHG bath as he lives alone and is unable to bathe or shower. Nurse instructed patient to do the best he can and if he is unable to complete CHG then CHG cloths will be given DOS. Patient verbalized understanding.

## 2015-06-09 ENCOUNTER — Ambulatory Visit (HOSPITAL_COMMUNITY): Payer: Medicare Other

## 2015-06-09 ENCOUNTER — Encounter (HOSPITAL_COMMUNITY)
Admission: AD | Disposition: A | Discharge status: Skilled Nursing Facility | Payer: Self-pay | Source: Ambulatory Visit | Attending: Orthopaedic Surgery

## 2015-06-09 ENCOUNTER — Encounter (HOSPITAL_COMMUNITY): Payer: Self-pay | Admitting: Surgery

## 2015-06-09 ENCOUNTER — Ambulatory Visit (HOSPITAL_COMMUNITY): Payer: Medicare Other | Admitting: Anesthesiology

## 2015-06-09 ENCOUNTER — Inpatient Hospital Stay (HOSPITAL_COMMUNITY)
Admission: AD | Admit: 2015-06-09 | Discharge: 2015-06-11 | DRG: 517 | Disposition: A | Discharge status: Skilled Nursing Facility | Payer: Medicare Other | Source: Ambulatory Visit | Attending: Orthopaedic Surgery | Admitting: Orthopaedic Surgery

## 2015-06-09 ENCOUNTER — Ambulatory Visit (HOSPITAL_COMMUNITY): Payer: Medicare Other | Admitting: Emergency Medicine

## 2015-06-09 DIAGNOSIS — Z974 Presence of external hearing-aid: Secondary | ICD-10-CM | POA: Diagnosis not present

## 2015-06-09 DIAGNOSIS — Z951 Presence of aortocoronary bypass graft: Secondary | ICD-10-CM

## 2015-06-09 DIAGNOSIS — I10 Essential (primary) hypertension: Secondary | ICD-10-CM | POA: Diagnosis present

## 2015-06-09 DIAGNOSIS — K219 Gastro-esophageal reflux disease without esophagitis: Secondary | ICD-10-CM | POA: Diagnosis present

## 2015-06-09 DIAGNOSIS — I2510 Atherosclerotic heart disease of native coronary artery without angina pectoris: Secondary | ICD-10-CM | POA: Diagnosis present

## 2015-06-09 DIAGNOSIS — S82009A Unspecified fracture of unspecified patella, initial encounter for closed fracture: Secondary | ICD-10-CM

## 2015-06-09 DIAGNOSIS — H9193 Unspecified hearing loss, bilateral: Secondary | ICD-10-CM | POA: Diagnosis present

## 2015-06-09 DIAGNOSIS — E785 Hyperlipidemia, unspecified: Secondary | ICD-10-CM | POA: Diagnosis present

## 2015-06-09 DIAGNOSIS — W010XXA Fall on same level from slipping, tripping and stumbling without subsequent striking against object, initial encounter: Secondary | ICD-10-CM | POA: Diagnosis present

## 2015-06-09 DIAGNOSIS — I252 Old myocardial infarction: Secondary | ICD-10-CM | POA: Diagnosis not present

## 2015-06-09 DIAGNOSIS — Z87891 Personal history of nicotine dependence: Secondary | ICD-10-CM | POA: Diagnosis not present

## 2015-06-09 DIAGNOSIS — Z8249 Family history of ischemic heart disease and other diseases of the circulatory system: Secondary | ICD-10-CM | POA: Diagnosis not present

## 2015-06-09 DIAGNOSIS — S82041A Displaced comminuted fracture of right patella, initial encounter for closed fracture: Principal | ICD-10-CM | POA: Diagnosis present

## 2015-06-09 DIAGNOSIS — S82001A Unspecified fracture of right patella, initial encounter for closed fracture: Secondary | ICD-10-CM | POA: Diagnosis present

## 2015-06-09 DIAGNOSIS — Y92481 Parking lot as the place of occurrence of the external cause: Secondary | ICD-10-CM | POA: Diagnosis not present

## 2015-06-09 HISTORY — PX: ORIF PATELLA: SHX5033

## 2015-06-09 HISTORY — DX: Unspecified fracture of unspecified patella, initial encounter for closed fracture: S82.009A

## 2015-06-09 LAB — PROTIME-INR
INR: 1.23 (ref 0.00–1.49)
PROTHROMBIN TIME: 15.6 s — AB (ref 11.6–15.2)

## 2015-06-09 SURGERY — OPEN REDUCTION INTERNAL FIXATION (ORIF) PATELLA
Anesthesia: Regional | Site: Knee | Laterality: Right

## 2015-06-09 MED ORDER — CHLORHEXIDINE GLUCONATE 4 % EX LIQD
60.0000 mL | Freq: Once | CUTANEOUS | Status: DC
Start: 2015-06-09 — End: 2015-06-09

## 2015-06-09 MED ORDER — EPHEDRINE SULFATE 50 MG/ML IJ SOLN
INTRAMUSCULAR | Status: AC
  Filled 2015-06-09: qty 1

## 2015-06-09 MED ORDER — POTASSIUM CHLORIDE IN NACL 20-0.45 MEQ/L-% IV SOLN
INTRAVENOUS | Status: DC
  Administered 2015-06-09 – 2015-06-10 (×2): via INTRAVENOUS
  Filled 2015-06-09 (×7): qty 1000

## 2015-06-09 MED ORDER — PHENYLEPHRINE HCL 10 MG/ML IJ SOLN
INTRAMUSCULAR | Status: DC | PRN
  Administered 2015-06-09: 80 ug via INTRAVENOUS

## 2015-06-09 MED ORDER — MIDAZOLAM HCL 2 MG/2ML IJ SOLN
INTRAMUSCULAR | Status: AC
  Filled 2015-06-09: qty 4

## 2015-06-09 MED ORDER — ROCURONIUM BROMIDE 50 MG/5ML IV SOLN
INTRAVENOUS | Status: AC
  Filled 2015-06-09: qty 1

## 2015-06-09 MED ORDER — PROPOFOL 10 MG/ML IV BOLUS
INTRAVENOUS | Status: DC | PRN
  Administered 2015-06-09: 40 mg via INTRAVENOUS
  Administered 2015-06-09: 160 mg via INTRAVENOUS

## 2015-06-09 MED ORDER — HYDROMORPHONE HCL 1 MG/ML IJ SOLN
0.5000 mg | INTRAMUSCULAR | Status: DC | PRN
  Administered 2015-06-10: 0.5 mg via INTRAVENOUS
  Filled 2015-06-09: qty 1

## 2015-06-09 MED ORDER — LIDOCAINE HCL (CARDIAC) 20 MG/ML IV SOLN
INTRAVENOUS | Status: AC
  Filled 2015-06-09: qty 5

## 2015-06-09 MED ORDER — FENOFIBRATE 160 MG PO TABS
160.0000 mg | ORAL_TABLET | Freq: Every day | ORAL | Status: DC
  Administered 2015-06-10 – 2015-06-11 (×2): 160 mg via ORAL
  Filled 2015-06-09 (×2): qty 1

## 2015-06-09 MED ORDER — PROMETHAZINE HCL 25 MG/ML IJ SOLN: 6.2500 mg | INTRAMUSCULAR | Status: DC | PRN

## 2015-06-09 MED ORDER — PANTOPRAZOLE SODIUM 40 MG PO TBEC
40.0000 mg | DELAYED_RELEASE_TABLET | Freq: Every day | ORAL | Status: DC
  Administered 2015-06-10 – 2015-06-11 (×2): 40 mg via ORAL
  Filled 2015-06-09 (×2): qty 1

## 2015-06-09 MED ORDER — GLYCOPYRROLATE 0.2 MG/ML IJ SOLN
INTRAMUSCULAR | Status: DC | PRN
  Administered 2015-06-09 (×2): 0.1 mg via INTRAVENOUS

## 2015-06-09 MED ORDER — PROPOFOL 10 MG/ML IV BOLUS
INTRAVENOUS | Status: AC
  Filled 2015-06-09: qty 20

## 2015-06-09 MED ORDER — MEPERIDINE HCL 25 MG/ML IJ SOLN: 6.2500 mg | INTRAMUSCULAR | Status: DC | PRN

## 2015-06-09 MED ORDER — CEFAZOLIN SODIUM-DEXTROSE 2-3 GM-% IV SOLR
2.0000 g | Freq: Three times a day (TID) | INTRAVENOUS | Status: AC
  Administered 2015-06-09 (×2): 2 g via INTRAVENOUS
  Filled 2015-06-09 (×2): qty 50

## 2015-06-09 MED ORDER — SODIUM CHLORIDE 0.9 % IJ SOLN
INTRAMUSCULAR | Status: AC
  Filled 2015-06-09: qty 10

## 2015-06-09 MED ORDER — 0.9 % SODIUM CHLORIDE (POUR BTL) OPTIME
TOPICAL | Status: DC | PRN
  Administered 2015-06-09: 1000 mL

## 2015-06-09 MED ORDER — DICYCLOMINE HCL 20 MG PO TABS
20.0000 mg | ORAL_TABLET | Freq: Two times a day (BID) | ORAL | Status: DC
  Administered 2015-06-09 – 2015-06-11 (×4): 20 mg via ORAL
  Filled 2015-06-09 (×4): qty 1

## 2015-06-09 MED ORDER — PHENYLEPHRINE 40 MCG/ML (10ML) SYRINGE FOR IV PUSH (FOR BLOOD PRESSURE SUPPORT)
PREFILLED_SYRINGE | INTRAVENOUS | Status: AC
  Filled 2015-06-09: qty 10

## 2015-06-09 MED ORDER — LORATADINE 10 MG PO TABS
10.0000 mg | ORAL_TABLET | Freq: Every day | ORAL | Status: DC
  Administered 2015-06-10 – 2015-06-11 (×2): 10 mg via ORAL
  Filled 2015-06-09 (×2): qty 1

## 2015-06-09 MED ORDER — ONDANSETRON HCL 4 MG/2ML IJ SOLN: 4.0000 mg | Freq: Four times a day (QID) | INTRAMUSCULAR | Status: DC | PRN

## 2015-06-09 MED ORDER — HYDROCHLOROTHIAZIDE 25 MG PO TABS
12.5000 mg | ORAL_TABLET | Freq: Every day | ORAL | Status: DC
Start: 2015-06-09 — End: 2015-06-11
  Administered 2015-06-10: 12.5 mg via ORAL
  Filled 2015-06-09 (×2): qty 1

## 2015-06-09 MED ORDER — ONDANSETRON HCL 4 MG/2ML IJ SOLN
INTRAMUSCULAR | Status: AC
  Filled 2015-06-09: qty 4

## 2015-06-09 MED ORDER — EPHEDRINE SULFATE 50 MG/ML IJ SOLN
INTRAMUSCULAR | Status: DC | PRN
  Administered 2015-06-09 (×5): 10 mg via INTRAVENOUS

## 2015-06-09 MED ORDER — PNEUMOCOCCAL VAC POLYVALENT 25 MCG/0.5ML IJ INJ
0.5000 mL | INJECTION | INTRAMUSCULAR | Status: AC
  Administered 2015-06-10: 0.5 mL via INTRAMUSCULAR
  Filled 2015-06-09: qty 0.5

## 2015-06-09 MED ORDER — ONDANSETRON HCL 4 MG PO TABS: 4.0000 mg | ORAL_TABLET | Freq: Four times a day (QID) | ORAL | Status: DC | PRN

## 2015-06-09 MED ORDER — FENTANYL CITRATE (PF) 100 MCG/2ML IJ SOLN
INTRAMUSCULAR | Status: DC | PRN
  Administered 2015-06-09 (×2): 25 ug via INTRAVENOUS
  Administered 2015-06-09: 50 ug via INTRAVENOUS

## 2015-06-09 MED ORDER — POLYETHYLENE GLYCOL 3350 17 G PO PACK: 17.0000 g | PACK | Freq: Every day | ORAL | Status: DC | PRN

## 2015-06-09 MED ORDER — LACTASE 3000 UNITS PO TABS
1.0000 | ORAL_TABLET | Freq: Every day | ORAL | Status: DC
  Administered 2015-06-10 – 2015-06-11 (×2): 3000 [IU] via ORAL
  Filled 2015-06-09 (×2): qty 1

## 2015-06-09 MED ORDER — METOCLOPRAMIDE HCL 5 MG PO TABS: 5.0000 mg | ORAL_TABLET | Freq: Three times a day (TID) | ORAL | Status: DC | PRN

## 2015-06-09 MED ORDER — CARVEDILOL 12.5 MG PO TABS
12.5000 mg | ORAL_TABLET | Freq: Two times a day (BID) | ORAL | Status: DC
  Administered 2015-06-10 – 2015-06-11 (×3): 12.5 mg via ORAL
  Filled 2015-06-09 (×4): qty 1

## 2015-06-09 MED ORDER — ONDANSETRON HCL 4 MG/2ML IJ SOLN
INTRAMUSCULAR | Status: DC | PRN
  Administered 2015-06-09: 4 mg via INTRAVENOUS

## 2015-06-09 MED ORDER — GLYCOPYRROLATE 0.2 MG/ML IJ SOLN
INTRAMUSCULAR | Status: AC
  Filled 2015-06-09: qty 1

## 2015-06-09 MED ORDER — BUPIVACAINE-EPINEPHRINE (PF) 0.5% -1:200000 IJ SOLN
INTRAMUSCULAR | Status: DC | PRN
  Administered 2015-06-09: 30 mL via PERINEURAL

## 2015-06-09 MED ORDER — LIDOCAINE HCL (CARDIAC) 20 MG/ML IV SOLN
INTRAVENOUS | Status: DC | PRN
  Administered 2015-06-09: 80 mg via INTRAVENOUS

## 2015-06-09 MED ORDER — CEFAZOLIN SODIUM-DEXTROSE 2-3 GM-% IV SOLR
2.0000 g | INTRAVENOUS | Status: AC
  Administered 2015-06-09: 2 g via INTRAVENOUS
  Filled 2015-06-09: qty 50

## 2015-06-09 MED ORDER — METOCLOPRAMIDE HCL 5 MG/ML IJ SOLN: 5.0000 mg | Freq: Three times a day (TID) | INTRAMUSCULAR | Status: DC | PRN

## 2015-06-09 MED ORDER — LOSARTAN POTASSIUM 50 MG PO TABS
50.0000 mg | ORAL_TABLET | Freq: Every day | ORAL | Status: DC
  Administered 2015-06-10: 50 mg via ORAL
  Filled 2015-06-09 (×2): qty 1

## 2015-06-09 MED ORDER — MIDAZOLAM HCL 5 MG/5ML IJ SOLN
INTRAMUSCULAR | Status: DC | PRN
  Administered 2015-06-09: 1 mg via INTRAVENOUS

## 2015-06-09 MED ORDER — HYDROMORPHONE HCL 1 MG/ML IJ SOLN: 0.2500 mg | INTRAMUSCULAR | Status: DC | PRN

## 2015-06-09 MED ORDER — BUPIVACAINE HCL (PF) 0.25 % IJ SOLN
INTRAMUSCULAR | Status: DC | PRN
  Administered 2015-06-09: 20 mL

## 2015-06-09 MED ORDER — ACETAMINOPHEN 325 MG PO TABS
650.0000 mg | ORAL_TABLET | Freq: Four times a day (QID) | ORAL | Status: DC | PRN
  Administered 2015-06-10 – 2015-06-11 (×3): 650 mg via ORAL
  Filled 2015-06-09 (×3): qty 2

## 2015-06-09 MED ORDER — SUCCINYLCHOLINE CHLORIDE 20 MG/ML IJ SOLN
INTRAMUSCULAR | Status: AC
  Filled 2015-06-09: qty 1

## 2015-06-09 MED ORDER — OXYCODONE-ACETAMINOPHEN 5-325 MG PO TABS
1.0000 | ORAL_TABLET | Freq: Four times a day (QID) | ORAL | Status: DC | PRN
  Administered 2015-06-09 – 2015-06-11 (×7): 1 via ORAL
  Filled 2015-06-09 (×8): qty 1

## 2015-06-09 MED ORDER — ACETAMINOPHEN 650 MG RE SUPP: 650.0000 mg | Freq: Four times a day (QID) | RECTAL | Status: DC | PRN

## 2015-06-09 MED ORDER — LACTATED RINGERS IV SOLN
INTRAVENOUS | Status: DC | PRN
  Administered 2015-06-09: 07:00:00 via INTRAVENOUS

## 2015-06-09 MED ORDER — BUPIVACAINE HCL (PF) 0.25 % IJ SOLN
INTRAMUSCULAR | Status: AC
  Filled 2015-06-09: qty 30

## 2015-06-09 MED ORDER — FENTANYL CITRATE (PF) 250 MCG/5ML IJ SOLN
INTRAMUSCULAR | Status: AC
  Filled 2015-06-09: qty 5

## 2015-06-09 SURGICAL SUPPLY — 62 items
BANDAGE ELASTIC 4 VELCRO ST LF (GAUZE/BANDAGES/DRESSINGS) ×4 IMPLANT
BANDAGE ELASTIC 6 VELCRO ST LF (GAUZE/BANDAGES/DRESSINGS) ×4 IMPLANT
BANDAGE ESMARK 6X9 LF (GAUZE/BANDAGES/DRESSINGS) ×1 IMPLANT
BIT DRILL 2.7XCANN QCK CNCT (BIT) ×1 IMPLANT
BIT DRILL CANN 2.7 (BIT) ×1
BIT DRL 2.7XCANN QCK CNCT (BIT) ×1
BLADE SURG ROTATE 9660 (MISCELLANEOUS) ×2 IMPLANT
BNDG ESMARK 6X9 LF (GAUZE/BANDAGES/DRESSINGS) ×2
CANISTER SUCTION 2500CC (MISCELLANEOUS) ×2 IMPLANT
COVER SURGICAL LIGHT HANDLE (MISCELLANEOUS) ×2 IMPLANT
CUFF TOURNIQUET SINGLE 34IN LL (TOURNIQUET CUFF) IMPLANT
CUFF TOURNIQUET SINGLE 44IN (TOURNIQUET CUFF) IMPLANT
DECANTER SPIKE VIAL GLASS SM (MISCELLANEOUS) ×2 IMPLANT
DRAPE C-ARM 42X72 X-RAY (DRAPES) ×2 IMPLANT
DRAPE C-ARMOR (DRAPES) ×2 IMPLANT
DRAPE INCISE IOBAN 66X45 STRL (DRAPES) ×2 IMPLANT
DRAPE U-SHAPE 47X51 STRL (DRAPES) ×2 IMPLANT
DRSG ADAPTIC 3X8 NADH LF (GAUZE/BANDAGES/DRESSINGS) ×4 IMPLANT
DRSG EMULSION OIL 3X3 NADH (GAUZE/BANDAGES/DRESSINGS) ×2 IMPLANT
DRSG PAD ABDOMINAL 8X10 ST (GAUZE/BANDAGES/DRESSINGS) ×4 IMPLANT
ELECT REM PT RETURN 9FT ADLT (ELECTROSURGICAL) ×2
ELECTRODE REM PT RTRN 9FT ADLT (ELECTROSURGICAL) ×1 IMPLANT
GAUZE SPONGE 4X4 12PLY STRL (GAUZE/BANDAGES/DRESSINGS) ×4 IMPLANT
GAUZE SPONGE 4X4 16PLY XRAY LF (GAUZE/BANDAGES/DRESSINGS) ×2 IMPLANT
GLOVE BIOGEL PI IND STRL 8 (GLOVE) ×2 IMPLANT
GLOVE BIOGEL PI INDICATOR 8 (GLOVE) ×2
GLOVE ORTHO TXT STRL SZ7.5 (GLOVE) ×4 IMPLANT
GOWN STRL REUS W/ TWL LRG LVL3 (GOWN DISPOSABLE) ×1 IMPLANT
GOWN STRL REUS W/ TWL XL LVL3 (GOWN DISPOSABLE) ×1 IMPLANT
GOWN STRL REUS W/TWL 2XL LVL3 (GOWN DISPOSABLE) ×2 IMPLANT
GOWN STRL REUS W/TWL LRG LVL3 (GOWN DISPOSABLE) ×1
GOWN STRL REUS W/TWL XL LVL3 (GOWN DISPOSABLE) ×1
IMMOBILIZER KNEE 24 THIGH 36 (MISCELLANEOUS) ×1 IMPLANT
IMMOBILIZER KNEE 24 UNIV (MISCELLANEOUS) ×2
K-WIRE SMOOTH 1.6 (WIRE) ×4 IMPLANT
KIT BASIN OR (CUSTOM PROCEDURE TRAY) ×2 IMPLANT
KIT ROOM TURNOVER OR (KITS) ×2 IMPLANT
NEEDLE 22X1 1/2 (OR ONLY) (NEEDLE) ×2 IMPLANT
NS IRRIG 1000ML POUR BTL (IV SOLUTION) ×2 IMPLANT
PACK ORTHO EXTREMITY (CUSTOM PROCEDURE TRAY) ×2 IMPLANT
PAD ARMBOARD 7.5X6 YLW CONV (MISCELLANEOUS) ×4 IMPLANT
PAD CAST 4YDX4 CTTN HI CHSV (CAST SUPPLIES) ×3 IMPLANT
PADDING CAST ABS 4INX4YD NS (CAST SUPPLIES) ×1
PADDING CAST ABS COTTON 4X4 ST (CAST SUPPLIES) ×1 IMPLANT
PADDING CAST COTTON 4X4 STRL (CAST SUPPLIES) ×3
SCREW CANNULATED 4.0X46MM PT (Screw) ×2 IMPLANT
SCREW CANNULATED 4.0X50MM PT (Screw) ×2 IMPLANT
SPONGE LAP 4X18 X RAY DECT (DISPOSABLE) ×2 IMPLANT
STAPLER VISISTAT 35W (STAPLE) ×2 IMPLANT
SUCTION FRAZIER TIP 10 FR DISP (SUCTIONS) ×2 IMPLANT
SUT VIC AB 0 CT1 27 (SUTURE) ×1
SUT VIC AB 0 CT1 27XBRD ANBCTR (SUTURE) ×1 IMPLANT
SUT VIC AB 2-0 CT1 27 (SUTURE) ×2
SUT VIC AB 2-0 CT1 36 (SUTURE) ×2 IMPLANT
SUT VIC AB 2-0 CT1 TAPERPNT 27 (SUTURE) ×2 IMPLANT
SYR CONTROL 10ML LL (SYRINGE) IMPLANT
TOWEL OR 17X24 6PK STRL BLUE (TOWEL DISPOSABLE) ×2 IMPLANT
TOWEL OR 17X26 10 PK STRL BLUE (TOWEL DISPOSABLE) ×2 IMPLANT
TUBE CONNECTING 12X1/4 (SUCTIONS) ×2 IMPLANT
UNDERPAD 30X30 INCONTINENT (UNDERPADS AND DIAPERS) ×2 IMPLANT
WATER STERILE IRR 1000ML POUR (IV SOLUTION) ×2 IMPLANT
YANKAUER SUCT BULB TIP NO VENT (SUCTIONS) ×2 IMPLANT

## 2015-06-09 NOTE — Care Management (Signed)
Utilization review completed. Aramis Zobel, RN Case Manager [phone]. 

## 2015-06-09 NOTE — Anesthesia Postprocedure Evaluation (Signed)
Anesthesia Post Note  Patient: Jesse [name]  Procedure(s) Performed: Procedure(s) (LRB): OPEN REDUCTION INTERNAL (ORIF) FIXATION PATELLA (Right)  Anesthesia type: General  Patient location: PACU  Post pain: Pain level controlled  Post assessment: Post-op Vital signs reviewed  Last Vitals: BP 133/74 mmHg  Pulse 69  Temp(Src) 36.4 C (Oral)  Resp 19  SpO2 100%  Post vital signs: Reviewed  Level of consciousness: sedated  Complications: No apparent anesthesia complications

## 2015-06-09 NOTE — H&P (Signed)
Jesse [name] is an 81 y.o. male.   Chief Complaint: right patella fracture HPI: patient fell 01 Jun 2015.  Direct impact to anterior knee.  xrays showed a displaced patella fracture.  Pain with ambulating and knee flexion.  Was seen in ED and advised to f/u with [place] for treatment.   Past Medical History  Diagnosis Date  . Coronary artery disease   . Hyperlipidemia   . Hypertension   . AAA (abdominal aortic aneurysm) ([place])   . Arthritis   . Bronchitis     hx of appro 40 years ago  . Kidney stone 1968  . Myocardial infarction ([place]) 1992  . Pneumonia     hx of  . Cancer ([place]) 2010    prostate ( 40 Txs. of radiation treatments)  . GERD (gastroesophageal reflux disease)   . Environmental allergies   . HOH (hard of hearing)     wears bilateral hearing aids    Past Surgical History  Procedure Laterality Date  . Repair of ventral hernia  04-20-1994    P. Cameron Sprang MD  . Abdominal aortic aneurysm repair  08-13-1993    Dr Cameron Sprang  . Coronary artery bypass graft  03-01-1991    Dr. Servando Snare  . Repair of bowel obstruction  1999    Dr. Dalbert Batman  . Colonoscopy w/ polypectomy    . Hernia repair    . Resection femoral artery  03/19/2012    Dr. [name] Early  . False aneurysm repair  05/07/2012    Procedure: REPAIR FALSE ANEURYSM;  Surgeon: Rosetta Posner, MD;  Location: [facility] OR;  Service: Vascular;  Laterality: Left;  Repair of Left Femoral Artery Aneurysm  . Eye surgery      Detached retina (Left eye); bilateral cataract removal  . Cardiac catheterization      Family History  Problem Relation Age of Onset  . Cancer Mother   . Cancer Father   . Varicose Veins Father   . Cancer Sister   . Diabetes Sister   . Heart attack Sister    Social History:  reports that he quit smoking about 46 years ago. His smoking use included Cigarettes. He quit after 20 years of use. He has never used smokeless tobacco. He reports that he does not drink alcohol or use illicit drugs.  Allergies:   Allergies  Allergen Reactions  . Lactose Intolerance (Gi) Other (See Comments)    Gas, bloating, stomach indigestion   . Adhesive [Tape] Itching and Rash    Medications Prior to Admission  Medication Sig Dispense Refill  . acetaminophen (TYLENOL) 325 MG tablet Take 325 mg by mouth every 6 (six) hours as needed for moderate pain or headache.    . Alum Hydroxide-Mag Carbonate (GAVISCON PO) Take by mouth.    Marland Kitchen aspirin EC 81 MG tablet Take 1 tablet (81 mg total) by mouth daily.    . carvedilol (COREG) 12.5 MG tablet TAKE 1 BY MOUTH TWICE DAILY 180 tablet 4  . dicyclomine (BENTYL) 20 MG tablet Take 1 tablet (20 mg total) by mouth 2 (two) times daily. 20 tablet 0  . fenofibrate 160 MG tablet Take 1 tablet (160 mg total) by mouth daily. 90 tablet 2  . hydrochlorothiazide (HYDRODIURIL) 12.5 MG tablet TAKE 1 BY MOUTH DAILY 90 tablet 2  . Lactase (DAIRY DIGESTIVE SUPPLEMENT PO) Take by mouth.    . loratadine (CLARITIN) 10 MG tablet Take 10 mg by mouth daily.     Marland Kitchen losartan (COZAAR)  50 MG tablet TAKE 1 BY MOUTH DAILY 90 tablet 2  . omeprazole (PRILOSEC) 20 MG capsule Take 1 capsule (20 mg total) by mouth daily. 30 capsule 0  . oxyCODONE-acetaminophen (PERCOCET/ROXICET) 5-325 MG tablet Take 1 tablet by mouth every 6 (six) hours as needed for severe pain. 10 tablet 0  . Polyvinyl Alcohol-Povidone (REFRESH OP) Apply 1 drop to eye 3 (three) times daily as needed. For dry eyes    . Probiotic Product (PROBIOTIC PO) Take 1 capsule by mouth daily.    . simvastatin (ZOCOR) 40 MG tablet TAKE 1 BY MOUTH AT BEDTIME 90 tablet 2    No results found for this or any previous visit (from the past 48 hour(s)). No results found.  Review of Systems  Constitutional: Negative.   HENT: Negative.   Respiratory: Negative.   Cardiovascular: Negative.   Genitourinary: Negative.   Musculoskeletal: Positive for joint pain.  Psychiatric/Behavioral: Negative.     Blood pressure 113/59, pulse 98, temperature 97.5 F  (36.4 C), temperature source Oral, resp. rate 14, SpO2 98 %. Physical Exam  Constitutional: No distress.  HENT:  Head: Atraumatic.  Eyes: EOM are normal.  Neck: Normal range of motion.  Cardiovascular: Normal rate.   Respiratory: Effort normal. No respiratory distress.  GI: He exhibits no distension.  Musculoskeletal: He exhibits edema and tenderness.  Neurological: He is alert.  Skin: Skin is warm.     Assessment/Plan Right patella fracture.  Will proceed with ORIF as scheduled.  Surgical procedure along with potential risks and complications discussed.  All questions answered.    Ziad Maye M 06/09/2015, 6:57 AM

## 2015-06-09 NOTE — Interval H&P Note (Signed)
History and Physical Interval Note:  06/09/2015 7:23 AM  Jesse [name]  has presented today for surgery, with the diagnosis of Right Patella Fracture  The various methods of treatment have been discussed with the patient and family. After consideration of risks, benefits and other options for treatment, the patient has consented to  Procedure(s): OPEN REDUCTION INTERNAL (ORIF) FIXATION PATELLA (Right) as a surgical intervention .  The patient's history has been reviewed, patient examined, no change in status, stable for surgery.  I have reviewed the patient's chart and labs.  Questions were answered to the patient's satisfaction.     Sheria Rosello C

## 2015-06-09 NOTE — Op Note (Signed)
[name], Jesse [name]                  ACCOUNT NO.:  [id number]  MEDICAL RECORD NO.:  [id number]  LOCATION:  [facility]                         FACILITY:  [facility]  PHYSICIAN:  Emeterio Balke C. Lorin Mercy, M.D.    DATE OF BIRTH:  1928-01-11  DATE OF PROCEDURE:  06/09/2015 DATE OF DISCHARGE:                              OPERATIVE REPORT   PREOPERATIVE DIAGNOSIS:  Right comminuted patella fracture.  POSTOPERATIVE DIAGNOSIS:  Right comminuted patella fracture.  PROCEDURE:  Open reduction internal fixation of right patella.  SURGEON:  Esme Freund C. Lorin Mercy, M.D.  ASSISTANT:  Mack Guise, PA-C; who was present for the entire procedure, was medically necessary.  ANESTHESIA:  General plus preoperative block.  TOURNIQUET TIME:  Less than one hour.  INDICATIONS FOR PROCEDURE:  An 81 year old male with a comminuted patellar fracture with a displacement of articular surface, difficulty ambulating.  He was brought to the operating room for fixation of the patella.  DESCRIPTION OF PROCEDURE:  After standard prepping and draping, operative antibiotics, and time-out procedure, a proximal thigh tourniquet had been applied.  DuraPrep was used for prepping. Impervious stockinette, Coban, extremity sheets, and drapes were applied.  Leg was wrapped in an Esmarch, tourniquet inflated.  Betadine and Steri-Drape had been applied.  Midline incision was made.  Patella was exposed, and a K-wire was placed from distal to proximal, lagging across the two fragments using towel clips to help hold reduction to reduce the articular surface.  Once the K-wire was penetrated through, they were backed up some, checked under C-arm.  Measurements were made, and a 46-50 mm Zimmer stainless steel 4.0 cancellous partially threaded lag screws were used.  A 20-cage stainless steel wire was placed in a figure-of-eight fashion after C-arm confirmed good position.  Wire was tightened down using a wire tightener twisters, and the handle of the Cobb as a  fulcrum.  Square knot was tied.  There was a good position alignment in final x-rays.  Wound was irrigated.  Tourniquet was deflated.  Hemostasis was obtained.  A 2-0 Vicryl for subcutaneous tissue reapproximation, and skin staple closure, postop dressing, and knee immobilizer.  Instrument count and needle count was correct.  The patient tolerated the procedure well, transferred to the recovery room in stable condition.     Carolanne Mercier C. Lorin Mercy, M.D.     MCY/MEDQ  D:  06/09/2015  T:  06/09/2015  Job:  [id number]

## 2015-06-09 NOTE — Progress Notes (Signed)
Glasses given to pt.

## 2015-06-09 NOTE — Brief Op Note (Signed)
06/09/2015  9:18 AM  PATIENT:  Jesse [name]  81 y.o. male  PRE-OPERATIVE DIAGNOSIS:  Right Patella Fracture  POST-OPERATIVE DIAGNOSIS:  Right Patella Fracture  PROCEDURE:  Procedure(s): OPEN REDUCTION INTERNAL (ORIF) FIXATION PATELLA (Right)  SURGEON:  Surgeon(s) and Role:    * Marybelle Killings, MD - Primary  PHYSICIAN ASSISTANT: Benjiman Core    ANESTHESIA:   general  EBL:  Total I/O In: 900 [I.V.:900] Out: 20 [Blood:20]  BLOOD ADMINISTERED:none  DRAINS: none   LOCAL MEDICATIONS USED:  MARCAINE     SPECIMEN:  No Specimen  DISPOSITION OF SPECIMEN:  N/A  COUNTS:  YES  TOURNIQUET:   Total Tourniquet Time Documented: Thigh (Right) - 28 minutes Total: Thigh (Right) - 28 minutes   DICTATION: .Viviann Spare Dictation  PLAN OF CARE: Admit to inpatient   PATIENT DISPOSITION:  PACU - hemodynamically stable.

## 2015-06-09 NOTE — Anesthesia Procedure Notes (Addendum)
Procedure Name: LMA Insertion Date/Time: 06/09/2015 7:43 AM Performed by: Susa Loffler Pre-anesthesia Checklist: Patient identified, Timeout performed, Emergency Drugs available, Suction available and Patient being monitored Patient Re-evaluated:Patient Re-evaluated prior to inductionOxygen Delivery Method: Circle system utilized Preoxygenation: Pre-oxygenation with 100% oxygen Intubation Type: IV induction LMA: LMA inserted LMA Size: 4.0 Number of attempts: 1 Placement Confirmation: positive ETCO2 and breath sounds checked- equal and bilateral Tube secured with: Tape Dental Injury: Teeth and Oropharynx as per pre-operative assessment    Anesthesia Regional Block:  Femoral nerve block  Pre-Anesthetic Checklist: ,, timeout performed, Correct Patient, Correct Site, Correct Laterality, Correct Procedure, Correct Position, site marked, Risks and benefits discussed, Surgical consent,  Pre-op evaluation,  Post-op pain management  Laterality: Right  Prep: chloraprep       Needles:  Injection technique: Single-shot  Needle Type: Stimulator Needle - 80     Needle Length: 9cm 9 cm Needle Gauge: 22 and 22 G  Needle insertion depth: 6 cm   Additional Needles:  Procedures: ultrasound guided (picture in chart) and nerve stimulator Femoral nerve block  Nerve Stimulator or Paresthesia:  Response: Patellar snap, 0.5 mA,   Additional Responses:   Narrative:  Injection made incrementally with aspirations every 5 mL.  Performed by: Personally  Anesthesiologist: Nolon Nations  Additional Notes: BP cuff, EKG monitors applied. Sedation begun. Femoral artery palpated for location of nerve. After nerve location verified with U/S, anesthetic injected incrementally, slowly, and after negative aspirations under direct u/s guidance. Good perineural spread. Patient tolerated well.

## 2015-06-09 NOTE — Transfer of Care (Signed)
Immediate Anesthesia Transfer of Care Note  Patient: Jesse [name]  Procedure(s) Performed: Procedure(s): OPEN REDUCTION INTERNAL (ORIF) FIXATION PATELLA (Right)  Patient Location: PACU  Anesthesia Type:General  Level of Consciousness: awake, alert  and oriented  Airway & Oxygen Therapy: Patient Spontanous Breathing and Patient connected to nasal cannula oxygen  Post-op Assessment: Report given to RN and Post -op Vital signs reviewed and stable  Post vital signs: Reviewed and stable  Last Vitals:  Filed Vitals:   06/09/15 0617  BP: 113/59  Pulse: 98  Temp: 36.4 C  Resp: 14    Complications: No apparent anesthesia complications

## 2015-06-10 ENCOUNTER — Encounter (HOSPITAL_COMMUNITY): Payer: Self-pay | Admitting: Orthopaedic Surgery

## 2015-06-10 DIAGNOSIS — S82041A Displaced comminuted fracture of right patella, initial encounter for closed fracture: Secondary | ICD-10-CM | POA: Diagnosis not present

## 2015-06-10 NOTE — Clinical Social Work Note (Signed)
Clinical Social Work Assessment  Patient Details  Name: Jesse [name] MRN: [id number] Date of Birth: 02-23-1928  Date of referral:  06/10/15               Reason for consult:  Facility Placement                Permission sought to share information with:  Family Supports, Customer service manager Permission granted to share information::     Name::     Loni Dolly patient's daughter  Agency::  SNF admissions  Relationship::     Contact Information:     Housing/Transportation Living arrangements for the past 2 months:  Single Family Home Source of Information:  Patient, Adult Children Patient Interpreter Needed:  None Criminal Activity/Legal Involvement Pertinent to Current Situation/Hospitalization:  No - Comment as needed Significant Relationships:  Adult Children Lives with:   Alone Do you feel safe going back to the place where you live?  Yes (Patient agrees to having some short term rehab before returning back home.) Need for family participation in patient care:  No (Coment)  Care giving concerns:  Patient and his daughter feel he needs some short term rehab before he can return back home.   Social Worker assessment / plan: Patient is a 81 year old male who is alert and oriented and lives by himself.  Patient expressed that he has not been to SNF rehab before, CSW explained process and expectations for SNF.  Patient stated hopes he will not have to be in rehab very long, and is hopeful to return back home once he has received some PT and OT.  Patient was explained how insurance pays for his stay, and also informed that [facility] has to review his information and then determine if they can accept him.  Patient expressed he did not have any other questions.  Employment status:  Retired Nurse, adult PT Recommendations:  [place] / Referral to community resources:  [place]  Patient/Family's  Response to care:  Patient and family agreeable to going to SNF for short term rehab.  Patient/Family's Understanding of and Emotional Response to Diagnosis, Current Treatment, and Prognosis:  Patient aware of current prognosis and current treatment plan.  Emotional Assessment Appearance:  Appears stated age Attitude/Demeanor/Rapport:    Affect (typically observed):  Appropriate, Calm, Pleasant, Stable Orientation:  Oriented to Self, Oriented to Place, Oriented to  Time, Oriented to Situation Alcohol / Substance use:  Not Applicable Psych involvement (Current and /or in the community):  No (Comment)  Discharge Needs  Concerns to be addressed:  Lack of Support Readmission within the last 30 days:  No Current discharge risk:  Lives alone Barriers to Discharge:  No Barriers Identified   Anell Barr 06/10/2015, 7:18 PM

## 2015-06-10 NOTE — Progress Notes (Signed)
Subjective: Doing ok.  C/o right knee pain.  No c/o cp, sob.  States that he lives alone.  Has to children that live out of town and will not be able to offer assistance.    Objective: Vital signs in last 24 hours: Temp:  [97.5 F (36.4 C)-97.9 F (36.6 C)] 97.9 F (36.6 C) (11/16 0510) Pulse Rate:  [69-85] 73 (11/16 0510) Resp:  [10-19] 18 (11/16 0510) BP: (100-137)/(65-78) 137/76 mmHg (11/16 0510) SpO2:  [96 %-100 %] 98 % (11/16 0510)  Intake/Output from previous day: 11/15 0701 - 11/16 0700 In: 3443.8 [P.O.:1200; I.V.:2243.8] Out: 1045 [Urine:1025; Blood:20] Intake/Output this shift:    No results for input(s): HGB in the last 72 hours. No results for input(s): WBC, RBC, HCT, PLT in the last 72 hours. No results for input(s): NA, K, CL, CO2, BUN, CREATININE, GLUCOSE, CALCIUM in the last 72 hours.  Recent Labs  06/09/15 0708  INR 1.23    Exam:  Alert and oriented.  Dressing c/d/i.  Calf nontender.   Assessment/Plan: Will need snf placement.  Discussed [facility].  Ok to transfer when bed available.  Knee immobilizer on at all times.  Do not bend knee.    Jaquasha Carnevale M 06/10/2015, 8:41 AM

## 2015-06-10 NOTE — NC FL2 (Signed)
[place] LEVEL OF CARE SCREENING TOOL     IDENTIFICATION  Patient Name: Jesse [name] Birthdate: 06-08-1928 Sex: male Admission Date (Current Location): 06/09/2015  [facility] and [place] Number: Herbalist and Address:  [facility] [place] [address] [place], [place] [address]      Provider Number: [id number]  Attending Physician Name and Address:  Marybelle Killings, MD  Relative Name and Phone Number:  Loni Dolly, Daughter [phone] and [phone]    Current Level of Care: Hospital Recommended Level of Care: [place] Prior Approval Number:    Date Approved/Denied:   PASRR Number: [id number]  Discharge Plan: SNF    Current Diagnoses: Patient Active Problem List   Diagnosis Date Noted  . Patella fracture 06/09/2015  . Aftercare following surgery of the circulatory system, [place] 01/08/2013  . Pain in limb- Left popliteal 12/25/2012  . 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 Abdominal aneurysm without mention of rupture 02/21/2012  . Aneurysm of iliac artery ([facility]) 08/09/2011  . 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 [place], [place] 01/16/2009  . HYPERTENSION 01/16/2009  . CAD, ARTERY BYPASS GRAFT 01/16/2009  . PERIPHERAL VASCULAR DISEASE 01/16/2009  . ABDOMINAL AORTIC ANEURYSM REPAIR, HX OF 01/16/2009  . MIXED HYPERLIPIDEMIA 12/28/2008    Orientation ACTIVITIES/SOCIAL BLADDER RESPIRATION    Self, Time, Situation, Place    Continent Normal  BEHAVIORAL SYMPTOMS/MOOD NEUROLOGICAL BOWEL NUTRITION STATUS      Continent Diet  PHYSICIAN VISITS COMMUNICATION OF NEEDS Height & Weight Skin    Verbally 6' (182.9 cm) 180 lbs. Surgical wounds          AMBULATORY STATUS RESPIRATION    Supervision limited Normal      Personal Care Assistance Level of Assistance  Bathing, Dressing Bathing Assistance: Limited assistance    Dressing Assistance: Limited assistance      Functional Limitations Info                SPECIAL CARE FACTORS FREQUENCY  PT (By licensed PT)     PT Frequency: Minimum 3x per week             Additional Factors Info  Allergies, Code Status Code Status Info: Full Code Allergies Info: LACTOSE INTOLERANCE (GI), ADHESIVE           Current Medications (06/10/2015): Current Facility-Administered Medications  Medication Dose Route Frequency Provider Last Rate Last Dose  . 0.45 % NaCl with KCl 20 mEq / L infusion   Intravenous Continuous Lanae Crumbly, PA-C 85 mL/hr at 06/10/15 [id number]    . acetaminophen (TYLENOL) tablet 650 mg  650 mg Oral Q6H PRN Lanae Crumbly, PA-C   650 mg at 06/10/15 1647   Or  . acetaminophen (TYLENOL) suppository 650 mg  650 mg Rectal Q6H PRN Lanae Crumbly, PA-C      . carvedilol (COREG) tablet 12.5 mg  12.5 mg Oral BID WC Lanae Crumbly, PA-C   12.5 mg at 06/10/15 1647  . dicyclomine (BENTYL) tablet 20 mg  20 mg Oral BID Lanae Crumbly, PA-C   20 mg at 06/10/15 0845  . fenofibrate tablet 160 mg  160 mg Oral Daily Lanae Crumbly, PA-C   160 mg at 06/10/15 0845  . hydrochlorothiazide (HYDRODIURIL) tablet 12.5 mg  12.5 mg Oral Daily Lanae Crumbly, PA-C   12.5 mg at 06/10/15 0845  . lactase (LACTAID) tablet  3,000 Units  1 tablet Oral Daily Lanae Crumbly, PA-C   3,000 Units at 06/10/15 1223  . loratadine (CLARITIN) tablet 10 mg  10 mg Oral Daily Lanae Crumbly, PA-C   10 mg at 06/10/15 0845  . losartan (COZAAR) tablet 50 mg  50 mg Oral Daily Lanae Crumbly, PA-C   50 mg at 06/10/15 0845  . metoCLOPramide (REGLAN) tablet 5-10 mg  5-10 mg Oral Q8H PRN Lanae Crumbly, PA-C       Or  . metoCLOPramide (REGLAN) injection 5-10 mg  5-10 mg Intravenous Q8H PRN Lanae Crumbly, PA-C      . ondansetron [facility]) tablet 4 mg  4 mg Oral Q6H PRN Lanae Crumbly, PA-C       Or  . ondansetron [facility]) injection 4 mg  4 mg Intravenous Q6H PRN Lanae Crumbly, PA-C      .  oxyCODONE-acetaminophen (PERCOCET/ROXICET) 5-325 MG per tablet 1 tablet  1 tablet Oral Q6H PRN Lanae Crumbly, PA-C   1 tablet at 06/10/15 1830  . pantoprazole (PROTONIX) EC tablet 40 mg  40 mg Oral Daily Lanae Crumbly, PA-C   40 mg at 06/10/15 0844  . polyethylene glycol (MIRALAX / GLYCOLAX) packet 17 g  17 g Oral Daily PRN Lanae Crumbly, PA-C       Do not use this list as official medication orders. Please verify with discharge summary.  Discharge Medications:   Medication List    ASK your doctor about these medications        acetaminophen 325 MG tablet  Commonly known as:  TYLENOL  Take 325 mg by mouth every 6 (six) hours as needed for moderate pain or headache.     aspirin EC 81 MG tablet  Take 1 tablet (81 mg total) by mouth daily.     carvedilol 12.5 MG tablet  Commonly known as:  COREG  TAKE 1 BY MOUTH TWICE DAILY     DAIRY DIGESTIVE SUPPLEMENT PO  Take by mouth.     dicyclomine 20 MG tablet  Commonly known as:  BENTYL  Take 1 tablet (20 mg total) by mouth 2 (two) times daily.     fenofibrate 160 MG tablet  Take 1 tablet (160 mg total) by mouth daily.     GAVISCON PO  Take by mouth.     hydrochlorothiazide 12.5 MG tablet  Commonly known as:  HYDRODIURIL  TAKE 1 BY MOUTH DAILY     loratadine 10 MG tablet  Commonly known as:  CLARITIN  Take 10 mg by mouth daily.     losartan 50 MG tablet  Commonly known as:  COZAAR  TAKE 1 BY MOUTH DAILY     omeprazole 20 MG capsule  Commonly known as:  PRILOSEC  Take 1 capsule (20 mg total) by mouth daily.     oxyCODONE-acetaminophen 5-325 MG tablet  Commonly known as:  PERCOCET/ROXICET  Take 1 tablet by mouth every 6 (six) hours as needed for severe pain.     PROBIOTIC PO  Take 1 capsule by mouth daily.     REFRESH OP  Apply 1 drop to eye 3 (three) times daily as needed. For dry eyes     simvastatin 40 MG tablet  Commonly known as:  ZOCOR  TAKE 1 BY MOUTH AT BEDTIME        Relevant Imaging  Results:  Relevant Lab Results:  Recent Labs    Additional Information    Evette Cristal  R, LCSWA

## 2015-06-10 NOTE — Evaluation (Signed)
Physical Therapy Evaluation Patient Details Name: Jesse [name] MRN: [id number] DOB: 1927-12-09 Today's Date: 06/10/2015   History of Present Illness  81 y.o. male who reports falling in a parking lot and landing on his Rt knee which resulted in a patellar fx. The patient underwent an ORIF of the patella on 06/09/15. PMH: coronary artery disease, MI, hypertension, prostate cancer, AAA.  Clinical Impression  Patient is s/p above surgery resulting in functional limitations due to the deficits listed below (see PT Problem List).  Patient will benefit from skilled PT to increase their independence and safety with mobility to allow discharge to the venue listed below. Based upon the patient's current mobility level, recommending SNF upon D/C for further rehabilitation.       Follow Up Recommendations SNF;Supervision for mobility/OOB    Equipment Recommendations  None recommended by PT;Other (comment) (to be addressed at next level of care)    Recommendations for Other Services       Precautions / Restrictions Precautions Precautions: Fall Precaution Comments: no bending of Rt knee Required Braces or Orthoses: Knee Immobilizer - Right Knee Immobilizer - Right: On at all times Restrictions Weight Bearing Restrictions: Yes RLE Weight Bearing: Non weight bearing      Mobility  Bed Mobility               General bed mobility comments: up in chair upon arrival  Transfers Overall transfer level: Needs assistance Equipment used: Rolling walker (2 wheeled) Transfers: Sit to/from Stand Sit to Stand: Mod assist         General transfer comment: cues for hand position for safety with all transfers  Ambulation/Gait Ambulation/Gait assistance: Min guard Ambulation Distance (Feet): 12 Feet Assistive device: Rolling walker (2 wheeled) Gait Pattern/deviations: Step-to pattern Gait velocity: decreased   General Gait Details: Repeated cues during ambulation for NWB status. Patient  attempting to maintain full NWB but appeared to be unable to remain consistent.   Stairs            Wheelchair Mobility    Modified Rankin (Stroke Patients Only)       Balance Overall balance assessment: Needs assistance Sitting-balance support: No upper extremity supported Sitting balance-Leahy Scale: Good     Standing balance support: Single extremity supported Standing balance-Leahy Scale: Poor Standing balance comment: using rw for support                             Pertinent Vitals/Pain Pain Assessment: 0-10 Pain Score: 2  Pain Location: Rt knee Pain Descriptors / Indicators: Sore Pain Intervention(s): Limited activity within patient's tolerance;Monitored during session    Home Living Family/patient expects to be discharged to:: Skilled nursing facility Living Arrangements: Alone Available Help at Discharge: Other (Comment) (none) Type of Home: House       Home Layout: Able to live on main level with bedroom/bathroom Home Equipment: Crutches Additional Comments: stairs to enter with railing    Prior Function Level of Independence: Independent               Hand Dominance        Extremity/Trunk Assessment               Lower Extremity Assessment: LLE deficits/detail   LLE Deficits / Details: WFL for functional tasks     Communication   Communication: HOH  Cognition Arousal/Alertness: Awake/alert Behavior During Therapy: WFL for tasks assessed/performed Overall Cognitive Status: Within Functional Limits for tasks  assessed                      General Comments      Exercises        Assessment/Plan    PT Assessment Patient needs continued PT services  PT Diagnosis Difficulty walking   PT Problem List Decreased strength;Decreased activity tolerance;Decreased balance;Decreased mobility;Decreased knowledge of use of DME;Decreased safety awareness;Pain  PT Treatment Interventions DME instruction;Gait  training;Functional mobility training;Therapeutic activities;Therapeutic exercise;Stair training;Balance training;Patient/family education   PT Goals (Current goals can be found in the Care Plan section) Acute Rehab PT Goals Patient Stated Goal: be able to get back to being independent again PT Goal Formulation: With patient Time For Goal Achievement: 06/24/15 Potential to Achieve Goals: Fair    Frequency Min 3X/week   Barriers to discharge Decreased caregiver support      Co-evaluation               End of Session Equipment Utilized During Treatment: Gait belt;Right knee immobilizer Activity Tolerance: Patient tolerated treatment well;Other (comment) (difficulty with maintaining NWB status) Patient left: in chair;with call bell/phone within reach Nurse Communication: Mobility status;Precautions;Weight bearing status         Time: [id number] PT Time Calculation (min) (ACUTE ONLY): 23 min   Charges:   PT Evaluation $Initial PT Evaluation Tier I: 1 Procedure PT Treatments $Gait Training: 8-22 mins   PT G Codes:        Cassell Clement, PT, CSCS Pager [phone] Office [phone]  06/10/2015, 11:01 AM

## 2015-06-10 NOTE — Care Management Note (Signed)
Case Management Note  Patient Details  Name: Jesse [name] MRN: [id number] Date of Birth: 02-Mar-1928  Subjective/Objective:   81 yr old gentleman s/p fall who underwent a ORIF of his right patella.              Action/Plan: Case manager spoke with patient concerning discharge plans. He states he can not care for self at discharge, lives alone, family out of town and work. Case manager reassured patient that physical therapy will see him and  social worker will assist him to get to a skilled facility for rehab. Case manager will continue to monitor.  Expected Discharge Date:    06/11/15              Expected Discharge Plan:  Skilled Nursing Facility  In-House Referral:  Clinical Social Work  Discharge planning Services  CM Consult  Post Acute Care Choice:    Choice offered to:  NA  DME Arranged:  N/A DME Agency:     HH Arranged:  NA HH Agency:  NA  Status of Service:  Completed, signed off  Medicare Important Message Given:    Date Medicare IM Given:    Medicare IM give by:    Date Additional Medicare IM Given:    Additional Medicare Important Message give by:     If discussed at [place] of Stay Meetings, dates discussed:    Additional Comments:  Ninfa Meeker, RN 06/10/2015, 10:21 AM

## 2015-06-10 NOTE — Clinical Social Work Placement (Signed)
   CLINICAL SOCIAL WORK PLACEMENT  NOTE  Date:  06/10/2015  Patient Details  Name: Jesse [name] MRN: [id number] Date of Birth: [birth year]  Clinical Social Work is seeking post-discharge placement for this patient at the [place] level of care (*CSW will initial, date and re-position this form in  chart as items are completed):  Yes   Patient/family provided with [place] Work Department's list of facilities offering this level of care within the geographic area requested by the patient (or if unable, by the patient's family).  Yes   Patient/family informed of their freedom to choose among providers that offer the needed level of care, that participate in Medicare, Medicaid or managed care program needed by the patient, have an available bed and are willing to accept the patient.  Yes   Patient/family informed of [facility]'s ownership interest in [facility] and [facility], as well as of the fact that they are under no obligation to receive care at these facilities.  PASRR submitted to EDS on 06/10/15     PASRR number received on 06/10/15     Existing PASRR number confirmed on       FL2 transmitted to all facilities in geographic area requested by pt/family on 06/10/15     FL2 transmitted to all facilities within larger geographic area on       Patient informed that his/her managed care company has contracts with or will negotiate with certain facilities, including the following:            Patient/family informed of bed offers received.  Patient chooses bed at       Physician recommends and patient chooses bed at      Patient to be transferred to   on  .  Patient to be transferred to facility by       Patient family notified on   of transfer.  Name of family member notified:        PHYSICIAN Please sign FL2     Additional Comment:    _______________________________________________ Ross Ludwig, LCSWA 06/10/2015,  7:22 PM

## 2015-06-11 MED ORDER — OXYCODONE-ACETAMINOPHEN 5-325 MG PO TABS: 1.0000 | ORAL_TABLET | Freq: Four times a day (QID) | ORAL | Status: DC | PRN

## 2015-06-11 MED ORDER — ASPIRIN EC 325 MG PO TBEC: 325.0000 mg | DELAYED_RELEASE_TABLET | Freq: Every day | ORAL | Status: DC

## 2015-06-11 NOTE — Progress Notes (Signed)
Called report to SNF. Gave report to RN  

## 2015-06-11 NOTE — Discharge Planning (Signed)
Per [facility], no bed availability on 06/11/2015. Patient has accepted bed offer with [facility] for 06/11/2015.  [employer] Medicare authorization: Next review date: RUG level:  Facility: [facility] report number: [id number] Transportation: [place], [place] [facility]: [phone] Surgical: [phone]

## 2015-06-11 NOTE — Progress Notes (Signed)
Subjective: 2 Days Post-Op Procedure(s) (LRB): OPEN REDUCTION INTERNAL (ORIF) FIXATION PATELLA (Right) Patient reports pain as mild.    Objective: Vital signs in last 24 hours: Temp:  [98 F (36.7 C)-98.3 F (36.8 C)] 98 F (36.7 C) (11/17 0506) Pulse Rate:  [66-73] 66 (11/17 0506) Resp:  [16-18] 16 (11/17 0506) BP: (102-116)/(55-66) 116/66 mmHg (11/17 0506) SpO2:  [94 %-96 %] 94 % (11/17 0506)  Intake/Output from previous day: 11/16 0701 - 11/17 0700 In: 720 [P.O.:720] Out: 1075 [Urine:1075] Intake/Output this shift:    No results for input(s): HGB in the last 72 hours. No results for input(s): WBC, RBC, HCT, PLT in the last 72 hours. No results for input(s): NA, K, CL, CO2, BUN, CREATININE, GLUCOSE, CALCIUM in the last 72 hours.  Recent Labs  06/09/15 0708  INR 1.23    Neurologically intact  Assessment/Plan: 2 Days Post-Op Procedure(s) (LRB): OPEN REDUCTION INTERNAL (ORIF) FIXATION PATELLA (Right) Up with therapy Discharge to SNF  D/c saline lock.   Helmut Hennon C 06/11/2015, 7:57 AM

## 2015-06-11 NOTE — Clinical Social Work Placement (Signed)
   CLINICAL SOCIAL WORK PLACEMENT  NOTE  Date:  06/11/2015  Patient Details  Name: Jesse [name] MRN: [id number] Date of Birth: Apr 21, 1928  Clinical Social Work is seeking post-discharge placement for this patient at the [place] level of care (*CSW will initial, date and re-position this form in  chart as items are completed):  Yes   Patient/family provided with [place] Work Department's list of facilities offering this level of care within the geographic area requested by the patient (or if unable, by the patient's family).  Yes   Patient/family informed of their freedom to choose among providers that offer the needed level of care, that participate in Medicare, Medicaid or managed care program needed by the patient, have an available bed and are willing to accept the patient.  Yes   Patient/family informed of [place] [facility]'s ownership interest in [facility] and [facility], as well as of the fact that they are under no obligation to receive care at these facilities.  PASRR submitted to EDS on 06/10/15     PASRR number received on 06/10/15     Existing PASRR number confirmed on       FL2 transmitted to all facilities in geographic area requested by pt/family on 06/10/15     FL2 transmitted to all facilities within larger geographic area on       Patient informed that his/her managed care company has contracts with or will negotiate with certain facilities, including the following:        Yes   Patient/family informed of bed offers received.  Patient chooses bed at [facility]     Physician recommends and patient chooses bed at      Patient to be transferred to [facility] on 06/11/15.  Patient to be transferred to facility by [facility]AR     Patient family notified on 06/11/15 of transfer.  Name of family member notified:  Patient alert and oriented.     PHYSICIAN       Additional Comment:     _______________________________________________ Caroline Sauger, LCSW 06/11/2015, 10:58 AM

## 2015-06-11 NOTE — Discharge Summary (Signed)
Patient ID: Jesse [name] MRN: [id number] DOB/AGE: 1928-01-24 81 y.o.  Admit date: 06/09/2015 Discharge date: 06/11/2015  Admission Diagnoses:  Active Problems:   Patella fracture   Discharge Diagnoses:  Active Problems:   Patella fracture  status post Procedure(s): OPEN REDUCTION INTERNAL (ORIF) FIXATION PATELLA  Past Medical History  Diagnosis Date  . Coronary artery disease   . Hyperlipidemia   . Hypertension   . AAA (abdominal aortic aneurysm) ([place])   . Arthritis   . Bronchitis     hx of appro 40 years ago  . Kidney stone 1968  . Myocardial infarction ([place]) 1992  . Pneumonia     hx of  . Cancer ([place]) 2010    prostate ( 40 Txs. of radiation treatments)  . GERD (gastroesophageal reflux disease)   . Environmental allergies   . HOH (hard of hearing)     wears bilateral hearing aids    Surgeries: Procedure(s): OPEN REDUCTION INTERNAL (ORIF) FIXATION PATELLA on 06/09/2015   Consultants:    Discharged Condition: Improved  Hospital Course: Jesse [name] is an 81 y.o. male who was admitted 06/09/2015 for operative treatment of patella fracture.  Patient failed conservative treatments (please see the history and physical for the specifics) and had severe unremitting pain that affects sleep, daily activities and work/hobbies. After pre-op clearance, the patient was taken to the operating room on 06/09/2015 and underwent  Procedure(s): OPEN REDUCTION INTERNAL (ORIF) FIXATION PATELLA.    Patient was given perioperative antibiotics: Anti-infectives    Start     Dose/Rate Route Frequency Ordered Stop   06/09/15 1530  ceFAZolin (ANCEF) IVPB 2 g/50 mL premix     2 g 100 mL/hr over 30 Minutes Intravenous Every 8 hours 06/09/15 1037 06/09/15 2300   06/09/15 0608  ceFAZolin (ANCEF) IVPB 2 g/50 mL premix     2 g 100 mL/hr over 30 Minutes Intravenous On call to O.R. 06/09/15 0608 06/09/15 0800       Patient was given sequential compression devices and early ambulation to  prevent DVT.   Patient benefited maximally from hospital stay and there were no complications. At the time of discharge, the patient was urinating/moving their bowels without difficulty, tolerating a regular diet, pain is controlled with oral pain medications and they have been cleared by PT/OT.   Recent vital signs: Patient Vitals for the past 24 hrs:  BP Temp Temp src Pulse Resp SpO2  06/11/15 0506 116/66 mmHg 98 F (36.7 C) Oral 66 16 94 %  06/10/15 2026 102/64 mmHg 98.3 F (36.8 C) Oral 73 16 96 %  06/10/15 1423 (!) 110/55 mmHg 98 F (36.7 C) Oral 67 18 96 %     Recent laboratory studies:  Recent Labs  06/09/15 0708  INR 1.23     Discharge Medications:     Medication List    STOP taking these medications        acetaminophen 325 MG tablet  Commonly known as:  TYLENOL      TAKE these medications        aspirin EC 325 MG tablet  Take 1 tablet (325 mg total) by mouth daily.     carvedilol 12.5 MG tablet  Commonly known as:  COREG  TAKE 1 BY MOUTH TWICE DAILY     DAIRY DIGESTIVE SUPPLEMENT PO  Take by mouth.     dicyclomine 20 MG tablet  Commonly known as:  BENTYL  Take 1 tablet (20 mg total) by mouth 2 (  two) times daily.     fenofibrate 160 MG tablet  Take 1 tablet (160 mg total) by mouth daily.     GAVISCON PO  Take by mouth.     hydrochlorothiazide 12.5 MG tablet  Commonly known as:  HYDRODIURIL  TAKE 1 BY MOUTH DAILY     loratadine 10 MG tablet  Commonly known as:  CLARITIN  Take 10 mg by mouth daily.     losartan 50 MG tablet  Commonly known as:  COZAAR  TAKE 1 BY MOUTH DAILY     omeprazole 20 MG capsule  Commonly known as:  PRILOSEC  Take 1 capsule (20 mg total) by mouth daily.     oxyCODONE-acetaminophen 5-325 MG tablet  Commonly known as:  PERCOCET/ROXICET  Take 1 tablet by mouth every 6 (six) hours as needed for severe pain.     PROBIOTIC PO  Take 1 capsule by mouth daily.     REFRESH OP  Apply 1 drop to eye 3 (three) times daily  as needed. For dry eyes     simvastatin 40 MG tablet  Commonly known as:  ZOCOR  TAKE 1 BY MOUTH AT BEDTIME        Diagnostic Studies: Dg Chest 2 View  06/09/2015  CLINICAL DATA:  81 year old male under preoperative evaluation. History right patellar fracture. EXAM: CHEST  2 VIEW COMPARISON:  Chest x-ray 03/15/2012. FINDINGS: Linear opacity in the left mid to lower lung with some architectural distortion in the left lung base, similar to prior examinations, most compatible with chronic scarring. In addition, there is chronic elevation of the minor fissure, indicative of chronic right upper lobe volume loss (unchanged). No acute consolidative airspace disease. No pleural effusions. No evidence of pulmonary edema. Heart size is borderline enlarged. Upper mediastinal contours are within normal limits. Atherosclerosis in the thoracic aorta. Status post median sternotomy for CABG. IMPRESSION: 1. No radiographic evidence of acute cardiopulmonary disease. 2. Areas of scarring in the lungs bilaterally, as above, similar to the prior examination. 3. Atherosclerosis. Electronically Signed   By: Vinnie Langton M.D.   On: 06/09/2015 07:28   Dg Knee 1-2 Views Right  06/09/2015  CLINICAL DATA:  ORIF of right patella. EXAM: RIGHT KNEE - 1-2 VIEW; DG C-ARM 61-120 MIN COMPARISON:  06/01/2015 FINDINGS: Two fluoroscopic images of the patella were obtained. There are 2 surgical screws extending through the patella with surgical cerclage wires along the anterior aspect of the patella. Mild anterior displacement of the patellar fracture on the lateral view. IMPRESSION: Surgical fixation of the patellar fracture. Electronically Signed   By: Markus Daft M.D.   On: 06/09/2015 08:39   Dg Knee Complete 4 Views Right  06/01/2015  CLINICAL DATA:  Severe knee pain swelling bruising for 2 hours after tripping and falling today EXAM: RIGHT KNEE - COMPLETE 4+ VIEW COMPARISON:  None. FINDINGS: Calcification demonstrates known  popliteal artery aneurysm. Diffuse osteopenia. Comminuted fracture of the patella with prepatellar soft tissue swelling. Small joint effusion. IMPRESSION: Fracture of the patella. Known popliteal artery aneurysm. Electronically Signed   By: Skipper Cliche M.D.   On: 06/01/2015 21:13   Dg C-arm 1-60 Min  06/09/2015  CLINICAL DATA:  ORIF of right patella. EXAM: RIGHT KNEE - 1-2 VIEW; DG C-ARM 61-120 MIN COMPARISON:  06/01/2015 FINDINGS: Two fluoroscopic images of the patella were obtained. There are 2 surgical screws extending through the patella with surgical cerclage wires along the anterior aspect of the patella. Mild anterior displacement of the patellar  fracture on the lateral view. IMPRESSION: Surgical fixation of the patellar fracture. Electronically Signed   By: Markus Daft M.D.   On: 06/09/2015 08:39   Ct Renal Stone Study  05/28/2015  CLINICAL DATA:  Generalized abdominal pain once a week for 7 months. Malaise, belching, and constipation. Pain is sometimes worse in the right flank area. EXAM: CT ABDOMEN AND PELVIS WITHOUT CONTRAST TECHNIQUE: Multidetector CT imaging of the abdomen and pelvis was performed following the standard protocol without IV contrast. COMPARISON:  03/11/2014 FINDINGS: Patchy areas of tree-in-bud infiltration in both lung bases suggesting bronchiolitis. Coronary artery calcifications. Cholelithiasis with multiple stones in the gallbladder. No inflammatory infiltration. No bile duct dilatation. Unenhanced appearance of the liver, spleen, pancreas, adrenal glands, inferior vena cava, and retroperitoneal lymph nodes is unremarkable. Calcification of the abdominal aorta. Abdominal aortic dilatation measuring 3.2 cm maximal AP diameter. Narrative bi-iliac bypass grafts. 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 Central low-attenuation in the right kidney measuring 2.9 cm diameter, likely representing a cyst. No hydronephrosis in either kidney. Stomach, small  bowel, and colon are not abnormally distended. Diffusely stool-filled colon. No free air or free fluid in the abdomen. Pelvis: There is a left internal iliac artery aneurysm measuring 4 x 4.2 cm diameter. Right internal iliac artery aneurysm measuring 5.6 x 5.8 cm diameter. Both of the aneurysms demonstrate enlargement since previous study. No evidence of rupture. Prostate gland is not enlarged. Bladder wall is not thickened. No free or loculated pelvic fluid collections. No pelvic mass or lymphadenopathy. Degenerative changes in the spine and hips with mild lumbar scoliosis convex towards the left. No destructive bone lesions. IMPRESSION: Cholelithiasis. Abdominal aortic ectasia. Bilateral iliac artery aneurysms measuring rib 5.8 cm on the right and 4.2 cm on the left, demonstrating enlargement since previous study. Aortobifemoral bypass graft. Coronary artery calcifications. Electronically Signed   By: Lucienne Capers M.D.   On: 05/28/2015 01:33        Discharge Instructions    Call MD / Call 911    Complete by:  As directed   If you experience chest pain or shortness of breath, CALL 911 and be transported to the hospital emergency room.  If you develope a fever above 101 F, pus (white drainage) or increased drainage or redness at the wound, or calf pain, call your surgeon's office.     Constipation Prevention    Complete by:  As directed   Drink plenty of fluids.  Prune juice may be helpful.  You may use a stool softener, such as Colace (over the counter) 100 mg twice a day.  Use MiraLax (over the counter) for constipation as needed.     Diet - low sodium heart healthy    Complete by:  As directed      Discharge instructions    Complete by:  As directed   Ok to shower.  Do not apply any creams or ointments to incision.  Staples to be removed 2 weeks postop if indicated. Do not bend knee.  Knee immobilizer on at all times.  OK to weightbear as tolerated in knee immobilizer only.  Use walker.   Elevate foot as much as possible to decrease pain and swelling.  Ice pack to knee as needed.     Driving restrictions    Complete by:  As directed   No driving     Increase activity slowly as tolerated    Complete by:  As directed  Lifting restrictions    Complete by:  As directed   No lifting           Follow-up Information    Schedule an appointment as soon as possible for a visit with Marybelle Killings, MD.   Specialty:  Orthopedic Surgery   Why:  need return office visit 2 weeks postop. sooner if needed.    Contact information:   [place] [address] [phone]       Discharge Plan:  discharge to snf  Disposition:     Signed: [place] [phone] 06/11/2015, 8:27 AM

## 2015-06-16 ENCOUNTER — Non-Acute Institutional Stay (SKILLED_NURSING_FACILITY): Payer: Medicare Other | Admitting: Internal Medicine

## 2015-06-16 DIAGNOSIS — I714 Abdominal aortic aneurysm, without rupture, unspecified: Secondary | ICD-10-CM

## 2015-06-16 DIAGNOSIS — R2681 Unsteadiness on feet: Secondary | ICD-10-CM | POA: Diagnosis not present

## 2015-06-16 DIAGNOSIS — I10 Essential (primary) hypertension: Secondary | ICD-10-CM

## 2015-06-16 DIAGNOSIS — S82001S Unspecified fracture of right patella, sequela: Secondary | ICD-10-CM | POA: Diagnosis not present

## 2015-06-16 DIAGNOSIS — K219 Gastro-esophageal reflux disease without esophagitis: Secondary | ICD-10-CM | POA: Diagnosis not present

## 2015-06-16 DIAGNOSIS — E785 Hyperlipidemia, unspecified: Secondary | ICD-10-CM | POA: Diagnosis not present

## 2015-06-16 DIAGNOSIS — I25709 Atherosclerosis of coronary artery bypass graft(s), unspecified, with unspecified angina pectoris: Secondary | ICD-10-CM | POA: Diagnosis not present

## 2015-06-16 NOTE — Progress Notes (Signed)
Patient ID: Jesse [name], male   DOB: [birth year], 81 y.o.   MRN: [id number]     Facility: [facility]    PCP: Tawanna Solo, MD  Code Status: full code  Allergies  Allergen Reactions  . Lactose Intolerance (Gi) Other (See Comments)    Gas, bloating, stomach indigestion   . Adhesive [Tape] Itching and Rash    Chief Complaint  Patient presents with  . New Admit To SNF     HPI:  81 y.o. patient is here for short term rehabilitation post hospital admission from 06/09/15-06/11/15 with right patella fracture. He underwent open reduction and internal fixation. He has PMH of CAD, HTN, HLD, AAA, gerd, arthritis among others. His pain is under control with current regimen. He has noticed swelling in his right foot and ankle.   Review of Systems:  Constitutional: Negative for fever, chills, diaphoresis.  HENT: Negative for headache, congestion, nasal discharge, difficulty swallowing.   Eyes: Negative for eye pain, blurred vision, double vision and discharge.  Respiratory: Negative for cough, shortness of breath and wheezing.   Cardiovascular: Negative for chest pain, palpitations.  Gastrointestinal: Negative for heartburn, nausea, vomiting, abdominal pain. Genitourinary: Negative for dysuria, flank pain.  Musculoskeletal: Negative for back pain, falls in the facility Skin: Negative for itching, rash.  Neurological: Negative for dizziness, tingling, focal weakness Psychiatric/Behavioral: Negative for depression   Past Medical History  Diagnosis Date  . Coronary artery disease   . Hyperlipidemia   . Hypertension   . AAA (abdominal aortic aneurysm) ([place])   . Arthritis   . Bronchitis     hx of appro 40 years ago  . Kidney stone 1968  . Myocardial infarction ([place]) 1992  . Pneumonia     hx of  . Cancer ([place]) 2010    prostate ( 40 Txs. of radiation treatments)  . GERD (gastroesophageal reflux disease)   . Environmental allergies   . HOH (hard of  hearing)     wears bilateral hearing aids   Past Surgical History  Procedure Laterality Date  . Repair of ventral hernia  04-20-1994    P. Cameron Sprang MD  . Abdominal aortic aneurysm repair  08-13-1993    Dr Cameron Sprang  . Coronary artery bypass graft  03-01-1991    Dr. Servando Snare  . Repair of bowel obstruction  1999    Dr. Dalbert Batman  . Colonoscopy w/ polypectomy    . Hernia repair    . Resection femoral artery  03/19/2012    Dr. [name] Early  . False aneurysm repair  05/07/2012    Procedure: REPAIR FALSE ANEURYSM;  Surgeon: Rosetta Posner, MD;  Location: [facility] OR;  Service: Vascular;  Laterality: Left;  Repair of Left Femoral Artery Aneurysm  . Eye surgery      Detached retina (Left eye); bilateral cataract removal  . Cardiac catheterization    . Orif patella Right 06/09/2015    Procedure: OPEN REDUCTION INTERNAL (ORIF) FIXATION PATELLA;  Surgeon: Marybelle Killings, MD;  Location: [facility];  Service: Orthopedics;  Laterality: Right;   Social History:   reports that he quit smoking about 46 years ago. His smoking use included Cigarettes. He quit after 20 years of use. He has never used smokeless tobacco. He reports that he does not drink alcohol or use illicit drugs.  Family History  Problem Relation Age of Onset  . Cancer Mother   . Cancer Father   . Varicose Veins Father   . Cancer  Sister   . Diabetes Sister   . Heart attack Sister     Medications:   Medication List       This list is accurate as of: 06/16/15  3:26 PM.  Always use your most recent med list.               aspirin EC 325 MG tablet  Take 1 tablet (325 mg total) by mouth daily.     carvedilol 12.5 MG tablet  Commonly known as:  COREG  TAKE 1 BY MOUTH TWICE DAILY     DAIRY DIGESTIVE SUPPLEMENT PO  Take by mouth.     dicyclomine 20 MG tablet  Commonly known as:  BENTYL  Take 1 tablet (20 mg total) by mouth 2 (two) times daily.     fenofibrate 160 MG tablet  Take 1 tablet (160 mg total) by mouth daily.       GAVISCON PO  Take by mouth.     hydrochlorothiazide 12.5 MG tablet  Commonly known as:  HYDRODIURIL  TAKE 1 BY MOUTH DAILY     loratadine 10 MG tablet  Commonly known as:  CLARITIN  Take 10 mg by mouth daily.     losartan 50 MG tablet  Commonly known as:  COZAAR  TAKE 1 BY MOUTH DAILY     omeprazole 20 MG capsule  Commonly known as:  PRILOSEC  Take 1 capsule (20 mg total) by mouth daily.     oxyCODONE-acetaminophen 5-325 MG tablet  Commonly known as:  PERCOCET/ROXICET  Take 1 tablet by mouth every 6 (six) hours as needed for severe pain.     PROBIOTIC PO  Take 1 capsule by mouth daily.     REFRESH OP  Apply 1 drop to eye 3 (three) times daily as needed. For dry eyes     simvastatin 40 MG tablet  Commonly known as:  ZOCOR  TAKE 1 BY MOUTH AT BEDTIME         Physical Exam: Filed Vitals:   06/16/15 1522  BP: 131/87  Pulse: 66  Temp: 97.4 F (36.3 C)  Resp: 18  SpO2: 97%    General- elderly male, well built, in no acute distress Head- normocephalic, atraumatic Nose- normal nasal mucosa, no maxillary or frontal sinus tenderness, no nasal discharge Throat- moist mucus membrane Eyes- PERRLA, EOMI, no pallor, no icterus, no discharge, normal conjunctiva, normal sclera Neck- no cervical lymphadenopathy Cardiovascular- normal s1,s2, no murmurs, palpable dorsalis pedis and radial pulses, 1+ right leg edema Respiratory- bilateral clear to auscultation, no wheeze, no rhonchi, no crackles, no use of accessory muscles Abdomen- bowel sounds present, soft, non tender Musculoskeletal- able to move all 4 extremities, limited right knee range of motion, WBAT Neurological- no focal deficit, alert and oriented to person, place and time Skin- warm and dry, right knee surgical area with dressing in place and dressing clean and dry Psychiatry- normal mood and affect    Labs reviewed: Basic Metabolic Panel:  Recent Labs  05/28/15 0011  NA 137  K 4.0  CL 107  CO2 24   GLUCOSE 114*  BUN 30*  CREATININE 1.16  CALCIUM 9.9   Liver Function Tests:  Recent Labs  05/28/15 0011  AST 16  ALT 12*  ALKPHOS 45  BILITOT 1.0  PROT 6.5  ALBUMIN 4.2    Recent Labs  05/28/15 0011  LIPASE 48   No results for input(s): AMMONIA in the last 8760 hours. CBC:  Recent Labs  05/28/15 0011  WBC 5.7  HGB 13.0  HCT 39.2  MCV 89.9  PLT 240    Radiological Exams: Dg Chest 2 View  06/09/2015  CLINICAL DATA:  81 year old male under preoperative evaluation. History right patellar fracture. EXAM: CHEST  2 VIEW COMPARISON:  Chest x-ray 03/15/2012. FINDINGS: Linear opacity in the left mid to lower lung with some architectural distortion in the left lung base, similar to prior examinations, most compatible with chronic scarring. In addition, there is chronic elevation of the minor fissure, indicative of chronic right upper lobe volume loss (unchanged). No acute consolidative airspace disease. No pleural effusions. No evidence of pulmonary edema. Heart size is borderline enlarged. Upper mediastinal contours are within normal limits. Atherosclerosis in the thoracic aorta. Status post median sternotomy for CABG. IMPRESSION: 1. No radiographic evidence of acute cardiopulmonary disease. 2. Areas of scarring in the lungs bilaterally, as above, similar to the prior examination. 3. Atherosclerosis. Electronically Signed   By: Vinnie Langton M.D.   On: 06/09/2015 07:28   Dg Knee 1-2 Views Right  06/09/2015  CLINICAL DATA:  ORIF of right patella. EXAM: RIGHT KNEE - 1-2 VIEW; DG C-ARM 61-120 MIN COMPARISON:  06/01/2015 FINDINGS: Two fluoroscopic images of the patella were obtained. There are 2 surgical screws extending through the patella with surgical cerclage wires along the anterior aspect of the patella. Mild anterior displacement of the patellar fracture on the lateral view. IMPRESSION: Surgical fixation of the patellar fracture. Electronically Signed   By: Markus Daft M.D.    On: 06/09/2015 08:39   Dg C-arm 1-60 Min  06/09/2015  CLINICAL DATA:  ORIF of right patella. EXAM: RIGHT KNEE - 1-2 VIEW; DG C-ARM 61-120 MIN COMPARISON:  06/01/2015 FINDINGS: Two fluoroscopic images of the patella were obtained. There are 2 surgical screws extending through the patella with surgical cerclage wires along the anterior aspect of the patella. Mild anterior displacement of the patellar fracture on the lateral view. IMPRESSION: Surgical fixation of the patellar fracture. Electronically Signed   By: Markus Daft M.D.   On: 06/09/2015 08:39    Assessment/Plan  Unsteady gait Post right patella fracture. To be WBAT to RLE, Will have patient work with PT/OT as tolerated to regain strength and restore function.  Fall precautions are in place.  Right patella fracture S/p ORIF. Not to bend his right knee. WBAT. Has follow up with orthopedics. Will have him work with physical therapy and occupational therapy team to help with gait training and muscle strengthening exercises.fall precautions. Skin care. Encourage to be out of bed. Continue percocet 5-325 mg 1 tab q6h prn severe pain and tylenol for mild pain. Check cbc to assess fr post op blood loss anemia. Add ted hose to help with leg edema. Monitor bowel movement  HTN Stable bp, continue coreg 12.5 mg bid with hctz 12.5 mg daily and losartan 50 mg daily, monitor daily BP, check bmp  gerd Stable, continue prilosec 20 mg daily  Hyperlipidemia Continue zocor 40 mg daily for now  Abdominal aortic aneurysm Stable, continue aspirin 325 mg daily  CAD S/p bypass and graft. Chest pain free. Continue aspirin, statin, b blokcer and ARB for now   Goals of care: short term rehabilitation   Labs/tests ordered: cbc, bmp next lab  Family/ staff Communication: reviewed care plan with patient and nursing supervisor    Blanchie Serve, MD  [place] [phone] (Monday-Friday 8 am - 5 pm) [phone] (afterhours)

## 2015-06-23 ENCOUNTER — Encounter (HOSPITAL_COMMUNITY): Payer: Self-pay | Admitting: Orthopaedic Surgery

## 2015-06-25 LAB — CBC AND DIFFERENTIAL
HCT: 32 % — AB (ref 41–53)
HEMOGLOBIN: 10 g/dL — AB (ref 13.5–17.5)
PLATELETS: 253 10*3/uL (ref 150–399)
WBC: 5.8 10*3/mL

## 2015-06-29 ENCOUNTER — Non-Acute Institutional Stay (SKILLED_NURSING_FACILITY): Payer: Medicare Other | Admitting: Nurse Practitioner

## 2015-06-29 DIAGNOSIS — I25709 Atherosclerosis of coronary artery bypass graft(s), unspecified, with unspecified angina pectoris: Secondary | ICD-10-CM

## 2015-06-29 DIAGNOSIS — R2681 Unsteadiness on feet: Secondary | ICD-10-CM

## 2015-06-29 DIAGNOSIS — D62 Acute posthemorrhagic anemia: Secondary | ICD-10-CM

## 2015-06-29 DIAGNOSIS — E785 Hyperlipidemia, unspecified: Secondary | ICD-10-CM | POA: Diagnosis not present

## 2015-06-29 DIAGNOSIS — I10 Essential (primary) hypertension: Secondary | ICD-10-CM | POA: Diagnosis not present

## 2015-06-29 DIAGNOSIS — S82001S Unspecified fracture of right patella, sequela: Secondary | ICD-10-CM | POA: Diagnosis not present

## 2015-06-29 NOTE — Progress Notes (Signed)
Patient ID: Jesse [name], male   DOB: 1928-01-23, 81 y.o.   MRN: [id number]    Nursing Home Location:  [place] of Service: SNF (31)  PCP: Tawanna Solo, MD  Allergies  Allergen Reactions  . Lactose Intolerance (Gi) Other (See Comments)    Gas, bloating, stomach indigestion   . Adhesive [Tape] Itching and Rash    Chief Complaint  Patient presents with  . Discharge Note    HPI:  Patient is a 81 y.o. male seen today at [facility] for discharge home. He has PMH of CAD, HTN, HLD, AAA, gerd, arthritis.  Pt here for short term rehabilitation post hospital admission from 06/09/15-06/11/15 with right patella fracture. He underwent open reduction and internal fixation. Pt reports pain is under control with current regimen. Patient currently doing well with therapy, now stable to discharge home with home health.   Review of Systems:  Review of Systems  Constitutional: Negative for activity change, appetite change, fatigue and unexpected weight change.  HENT: Negative for congestion and hearing loss.   Eyes: Negative.   Respiratory: Negative for cough and shortness of breath.   Cardiovascular: Negative for chest pain, palpitations and leg swelling.  Gastrointestinal: Negative for abdominal pain, diarrhea and constipation.  Genitourinary: Negative for dysuria and difficulty urinating.  Musculoskeletal: Negative for myalgias and arthralgias.  Skin: Negative for color change and wound.  Neurological: Negative for dizziness and weakness.  Psychiatric/Behavioral: Negative for behavioral problems, confusion and agitation.    Past Medical History  Diagnosis Date  . Coronary artery disease   . Hyperlipidemia   . Hypertension   . AAA (abdominal aortic aneurysm) ([place])   . Arthritis   . Bronchitis     hx of appro 40 years ago  . Kidney stone 1968  . Myocardial infarction ([place]) 1992  . Pneumonia     hx of  . Cancer ([place]) 2010   prostate ( 40 Txs. of radiation treatments)  . GERD (gastroesophageal reflux disease)   . Environmental allergies   . HOH (hard of hearing)     wears bilateral hearing aids   Past Surgical History  Procedure Laterality Date  . Repair of ventral hernia  04-20-1994    P. Cameron Sprang MD  . Abdominal aortic aneurysm repair  08-13-1993    Dr Cameron Sprang  . Coronary artery bypass graft  03-01-1991    Dr. Servando Snare  . Repair of bowel obstruction  1999    Dr. Dalbert Batman  . Colonoscopy w/ polypectomy    . Hernia repair    . Resection femoral artery  03/19/2012    Dr. [name] Early  . False aneurysm repair  05/07/2012    Procedure: REPAIR FALSE ANEURYSM;  Surgeon: Rosetta Posner, MD;  Location: [facility] OR;  Service: Vascular;  Laterality: Left;  Repair of Left Femoral Artery Aneurysm  . Eye surgery      Detached retina (Left eye); bilateral cataract removal  . Cardiac catheterization    . Orif patella Right 06/09/2015    Procedure: OPEN REDUCTION INTERNAL (ORIF) FIXATION PATELLA;  Surgeon: Marybelle Killings, MD;  Location: [facility];  Service: Orthopedics;  Laterality: Right;   Social History:   reports that he quit smoking about 46 years ago. His smoking use included Cigarettes. He quit after 20 years of use. He has never used smokeless tobacco. He reports that he does not drink alcohol or use illicit drugs.  Family History  Problem Relation Age of Onset  . Cancer Mother   . Cancer Father   . Varicose Veins Father   . Cancer Sister   . Diabetes Sister   . Heart attack Sister     Medications: Patient's Medications  New Prescriptions   No medications on file  Previous Medications   ALUM HYDROXIDE-MAG CARBONATE (GAVISCON PO)    Take by mouth.   ASPIRIN EC 325 MG TABLET    Take 1 tablet (325 mg total) by mouth daily.   CARVEDILOL (COREG) 12.5 MG TABLET    TAKE 1 BY MOUTH TWICE DAILY   DICYCLOMINE (BENTYL) 20 MG TABLET    Take 1 tablet (20 mg total) by mouth 2 (two) times daily.   FENOFIBRATE 160 MG  TABLET    Take 1 tablet (160 mg total) by mouth daily.   HYDROCHLOROTHIAZIDE (HYDRODIURIL) 12.5 MG TABLET    TAKE 1 BY MOUTH DAILY   LACTASE (DAIRY DIGESTIVE SUPPLEMENT PO)    Take by mouth.   LORATADINE (CLARITIN) 10 MG TABLET    Take 10 mg by mouth daily.    LOSARTAN (COZAAR) 50 MG TABLET    TAKE 1 BY MOUTH DAILY   OMEPRAZOLE (PRILOSEC) 20 MG CAPSULE    Take 1 capsule (20 mg total) by mouth daily.   OXYCODONE-ACETAMINOPHEN (PERCOCET/ROXICET) 5-325 MG TABLET    Take 1 tablet by mouth every 6 (six) hours as needed for severe pain.   POLYVINYL ALCOHOL-POVIDONE (REFRESH OP)    Apply 1 drop to eye 3 (three) times daily as needed. For dry eyes   PROBIOTIC PRODUCT (PROBIOTIC PO)    Take 1 capsule by mouth daily.   SIMVASTATIN (ZOCOR) 40 MG TABLET    TAKE 1 BY MOUTH AT BEDTIME  Modified Medications   No medications on file  Discontinued Medications   No medications on file     Physical Exam: Filed Vitals:   06/29/15 1509  BP: 104/64  Pulse: 74  Temp: 98.7 F (37.1 C)  Resp: 20    Physical Exam  Constitutional: He is oriented to person, place, and time. He appears well-developed and well-nourished. No distress.  HENT:  Head: Normocephalic and atraumatic.  Mouth/Throat: Oropharynx is clear and moist. No oropharyngeal exudate.  Eyes: Conjunctivae and EOM are normal. Pupils are equal, round, and reactive to light.  Neck: Normal range of motion. Neck supple.  Cardiovascular: Normal rate, regular rhythm and normal heart sounds.   Pulmonary/Chest: Effort normal and breath sounds normal.  Abdominal: Soft. Bowel sounds are normal.  Musculoskeletal: He exhibits no edema or tenderness.  Limited ROM to right knee  Neurological: He is alert and oriented to person, place, and time.  Skin: Skin is warm and dry. He is not diaphoretic.  Psychiatric: He has a normal mood and affect.    Labs reviewed: Basic Metabolic Panel:  Recent Labs  05/28/15 0011  NA 137  K 4.0  CL 107  CO2 24    GLUCOSE 114*  BUN 30*  CREATININE 1.16  CALCIUM 9.9   Liver Function Tests:  Recent Labs  05/28/15 0011  AST 16  ALT 12*  ALKPHOS 45  BILITOT 1.0  PROT 6.5  ALBUMIN 4.2    Recent Labs  05/28/15 0011  LIPASE 48   No results for input(s): AMMONIA in the last 8760 hours. CBC:  Recent Labs  05/28/15 0011 06/25/15  WBC 5.7 5.8  HGB 13.0 10.0*  HCT 39.2 32*  MCV 89.9  --   PLT  240 253   TSH: No results for input(s): TSH in the last 8760 hours. A1C: No results found for: HGBA1C Lipid Panel: No results for input(s): CHOL, HDL, LDLCALC, TRIG, CHOLHDL, LDLDIRECT in the last 8760 hours.    Assessment/Plan 1. Patella fracture, right, sequela S/p ORIF. WBAT. Ongoing follow up with orthopedics. pain controlled on percocet 5-325 mg 1 tab q6h prn severe pain and tylenol for mild pain.  2. Unsteady gait Will need outpatient therapy, to cont fall precautions.   3. Essential hypertension -Stable, continue coreg 12.5 mg bid with hctz 12.5 mg daily and losartan 50 mg daily  4. Atherosclerosis of coronary artery bypass graft of native heart with angina pectoris (HCC) Stable, without chest pains at this time, cont asa, statin and betablocker.   5. Hyperlipidemia conts on zocor 40 mg daily   6. Acute blood loss anemia hgb down from pre-op- no post op hgb noted, pt asymptomatic will need ongoing follow up of hgb by PCP  pt is stable for discharge-will need PT/OT/hha per home health. DME needed FWW.  Rx written.  will need to follow up with PCP within 2 weeks.    Carlos American. Harle Battiest  [facility] [phone] 8 am - 5 pm) [phone] (after hours)

## 2015-07-23 ENCOUNTER — Encounter: Payer: Self-pay | Admitting: Cardiovascular Disease

## 2015-07-23 ENCOUNTER — Ambulatory Visit (INDEPENDENT_AMBULATORY_CARE_PROVIDER_SITE_OTHER): Payer: Medicare Other | Admitting: Cardiovascular Disease

## 2015-07-23 VITALS — BP 120/60 | HR 68 | Ht 72.0 in | Wt 186.2 lb

## 2015-07-23 DIAGNOSIS — I2510 Atherosclerotic heart disease of native coronary artery without angina pectoris: Secondary | ICD-10-CM | POA: Diagnosis not present

## 2015-07-23 NOTE — Patient Instructions (Signed)

## 2015-07-24 ENCOUNTER — Other Ambulatory Visit: Payer: Self-pay | Admitting: Cardiovascular Disease

## 2015-07-25 NOTE — Progress Notes (Signed)
Cardiology Office Note Date:  07/25/2015   ID:  Jesse [name], DOB 1927/10/24, MRN [id number]  PCP:  Tawanna Solo, MD  Cardiologist:  [name], MD    Chief Complaint  Patient presents with  . Coronary Artery Disease    denies cp/sob/leg swelling     History of Present Illness: Jesse [name] is a 81 y.o. male who presents for follow-up evaluation. The patient is followed for coronary artery disease status post remote CABG in 1992. He has a chronic right bundle branch block. The patient is followed by Dr. 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 He has been managed conservatively with medical therapy.  He has been stable from a cardiac perspective. Since his last visit he fell and dislocated his patella, required surgery, reports severe pain at the time of the injury but now doing much better. Today, he denies symptoms of palpitations, chest pain, shortness of breath, orthopnea, PND, lower extremity edema, dizziness, or syncope.    Past Medical History  Diagnosis Date  . Coronary artery disease   . Hyperlipidemia   . Hypertension   . AAA (abdominal aortic aneurysm) ([place])   . Arthritis   . Bronchitis     hx of appro 40 years ago  . Kidney stone 1968  . Myocardial infarction ([place]) 1992  . Pneumonia     hx of  . Cancer ([place]) 2010    prostate ( 40 Txs. of radiation treatments)  . GERD (gastroesophageal reflux disease)   . Environmental allergies   . HOH (hard of hearing)     wears bilateral hearing aids    Past Surgical History  Procedure Laterality Date  . Repair of ventral hernia  04-20-1994    P. Cameron Sprang MD  . Abdominal aortic aneurysm repair  08-13-1993    Dr Cameron Sprang  . Coronary artery bypass graft  03-01-1991    Dr. Servando Snare  . Repair of bowel obstruction  1999    Dr. Dalbert Batman  . Colonoscopy w/ polypectomy    . Hernia repair    . Resection femoral artery  03/19/2012    Dr. [name] Early  . False  aneurysm repair  05/07/2012    Procedure: REPAIR FALSE ANEURYSM;  Surgeon: Rosetta Posner, MD;  Location: [facility] OR;  Service: Vascular;  Laterality: Left;  Repair of Left Femoral Artery Aneurysm  . Eye surgery      Detached retina (Left eye); bilateral cataract removal  . Cardiac catheterization    . Orif patella Right 06/09/2015    Procedure: OPEN REDUCTION INTERNAL (ORIF) FIXATION PATELLA;  Surgeon: Marybelle Killings, MD;  Location: [place];  Service: Orthopedics;  Laterality: Right;    Current Outpatient Prescriptions  Medication Sig Dispense Refill  . Alum Hydroxide-Mag Carbonate (GAVISCON PO) Take 1 tablet by mouth daily as needed (upset stomach).     Marland Kitchen aspirin EC 325 MG tablet Take 1 tablet (325 mg total) by mouth daily. 30 tablet 0  . carvedilol (COREG) 12.5 MG tablet Take 12.5 mg by mouth 2 (two) times daily with a meal.    . dicyclomine (BENTYL) 20 MG tablet Take 1 tablet (20 mg total) by mouth 2 (two) times daily. 20 tablet 0  . fenofibrate 160 MG tablet Take 1 tablet (160 mg total) by mouth daily. 90 tablet 2  . hydrochlorothiazide (HYDRODIURIL) 12.5 MG tablet Take 12.5 mg by mouth daily.    . Lactase (DAIRY DIGESTIVE SUPPLEMENT PO) Take 1  tablet by mouth daily as needed (when eating foods containing lactose).     Marland Kitchen loratadine (CLARITIN) 10 MG tablet Take 10 mg by mouth daily.     Marland Kitchen losartan (COZAAR) 50 MG tablet Take 50 mg by mouth daily.    Marland Kitchen omeprazole (PRILOSEC) 20 MG capsule Take 1 capsule (20 mg total) by mouth daily. 30 capsule 0  . oxyCODONE-acetaminophen (PERCOCET/ROXICET) 5-325 MG tablet Take 1 tablet by mouth every 6 (six) hours as needed for severe pain. 60 tablet 0  . Polyvinyl Alcohol-Povidone (REFRESH OP) Apply 1 drop to eye 3 (three) times daily as needed. For dry eyes    . Probiotic Product (PROBIOTIC PO) Take 1 capsule by mouth daily.    . simvastatin (ZOCOR) 40 MG tablet Take 40 mg by mouth daily at 6 PM.    . carvedilol (COREG) 12.5 MG tablet TAKE 1 BY MOUTH TWICE DAILY  180 tablet 3   No current facility-administered medications for this visit.    Allergies:   Lactose intolerance (gi) and Adhesive   Social History:  The patient  reports that he quit smoking about 47 years ago. His smoking use included Cigarettes. He quit after 20 years of use. He has never used smokeless tobacco. He reports that he does not drink alcohol or use illicit drugs.   Family History:  The patient's  family history includes Cancer in his father, mother, and sister; Diabetes in his sister; Heart attack in his sister; Varicose Veins in his father.    ROS:  Please see the history of present illness.  Otherwise, review of systems is positive for hearing loss, visual disturbance, rash, leg pain, joint swelling, balance problems.  All other systems are reviewed and negative.    PHYSICAL EXAM: VS:  BP 120/60 mmHg  Pulse 68  Ht 6' (1.829 m)  Wt 186 lb 3.2 oz (84.46 kg)  BMI 25.25 kg/m2 , BMI Body mass index is 25.25 kg/(m^2). GEN: Well nourished, well developed, pleasant elderly male in no acute distress HEENT: normal Neck: no JVD, no masses. bilateral carotid bruits Cardiac: RRR without murmur or gallop                Respiratory:  clear to auscultation bilaterally, normal work of breathing GI: soft, nontender, nondistended, + BS MS: no deformity or atrophy Ext: no pretibial edema, pedal pulses 2+= bilaterally Skin: warm and dry, no rash Neuro:  Strength and sensation are intact Psych: euthymic mood, full affect  EKG:  EKG is not ordered today.  Recent Labs: 05/28/2015: ALT 12*; BUN 30*; Creatinine, Ser 1.16; Potassium 4.0; Sodium 137 06/25/2015: Hemoglobin 10.0*; Platelets 253   Lipid Panel     Component Value Date/Time   CHOL 123 05/20/2014 0833   TRIG 64.0 05/20/2014 0833   TRIG 84 05/05/2006 0839   HDL 28.90* 05/20/2014 0833   CHOLHDL 4 05/20/2014 0833   CHOLHDL 5.4 CALC 05/05/2006 0839   VLDL 12.8 05/20/2014 0833   LDLCALC 81 05/20/2014 0833      Wt Readings  from Last 3 Encounters:  07/23/15 186 lb 3.2 oz (84.46 kg)  06/08/15 180 lb (81.647 kg)  04/28/15 192 lb 4.8 oz (87.227 kg)     Cardiac Studies Reviewed: Nuclear Stress Test 05-08-2013: Overall Impression: Low risk stress nuclear study. There is a primarily fixed basal to mid inferior and inferolateral perfusion defect, medium-sized and moderate in intensity. Inferolateral hypokinesis. .  LV Ejection Fraction: 48%. LV Wall Motion: Inferolateral hypokinesis.   ASSESSMENT AND  PLAN: 1.  CAD, native vessel: no angina. Last nuclear scan reviewed as above with low-risk result. He's had remote CABG and appears stable on his current Rx.  2. Hyperlipidemia: labs reviewed. On simvastatin, fenofibrate  3. Essential HTN: BP well-controlled.  4. PAD/diffuse aneurysmal disease. Managed conservatively. Dr Luther Parody most recent note reviewed.  Current medicines are reviewed with the patient today.  The patient does not have concerns regarding medicines.  Labs/ tests ordered today include:  No orders of the defined types were placed in this encounter.    Disposition:   FU one year  Signed, [name], MD  07/25/2015 10:55 AM    [address], [place], [place]  [id number] Phone: [phone]; Fax: [phone]

## 2015-09-24 DIAGNOSIS — H01024 Squamous blepharitis left upper eyelid: Secondary | ICD-10-CM | POA: Diagnosis not present

## 2015-09-24 DIAGNOSIS — H16293 Other keratoconjunctivitis, bilateral: Secondary | ICD-10-CM | POA: Diagnosis not present

## 2015-09-24 DIAGNOSIS — H01021 Squamous blepharitis right upper eyelid: Secondary | ICD-10-CM | POA: Diagnosis not present

## 2015-10-01 DIAGNOSIS — H01021 Squamous blepharitis right upper eyelid: Secondary | ICD-10-CM | POA: Diagnosis not present

## 2015-10-01 DIAGNOSIS — H16293 Other keratoconjunctivitis, bilateral: Secondary | ICD-10-CM | POA: Diagnosis not present

## 2015-10-01 DIAGNOSIS — H01024 Squamous blepharitis left upper eyelid: Secondary | ICD-10-CM | POA: Diagnosis not present

## 2015-10-05 DIAGNOSIS — H01021 Squamous blepharitis right upper eyelid: Secondary | ICD-10-CM | POA: Diagnosis not present

## 2015-10-05 DIAGNOSIS — H01024 Squamous blepharitis left upper eyelid: Secondary | ICD-10-CM | POA: Diagnosis not present

## 2015-10-05 DIAGNOSIS — H16293 Other keratoconjunctivitis, bilateral: Secondary | ICD-10-CM | POA: Diagnosis not present

## 2015-10-08 DIAGNOSIS — C61 Malignant neoplasm of prostate: Secondary | ICD-10-CM | POA: Diagnosis not present

## 2015-10-12 DIAGNOSIS — H01021 Squamous blepharitis right upper eyelid: Secondary | ICD-10-CM | POA: Diagnosis not present

## 2015-10-12 DIAGNOSIS — H16293 Other keratoconjunctivitis, bilateral: Secondary | ICD-10-CM | POA: Diagnosis not present

## 2015-10-12 DIAGNOSIS — H01024 Squamous blepharitis left upper eyelid: Secondary | ICD-10-CM | POA: Diagnosis not present

## 2015-10-15 DIAGNOSIS — Z0000 Encounter for general adult medical examination without abnormal findings: Secondary | ICD-10-CM | POA: Diagnosis not present

## 2015-10-15 DIAGNOSIS — C61 Malignant neoplasm of prostate: Secondary | ICD-10-CM | POA: Diagnosis not present

## 2015-10-19 DIAGNOSIS — H01024 Squamous blepharitis left upper eyelid: Secondary | ICD-10-CM | POA: Diagnosis not present

## 2015-10-19 DIAGNOSIS — S0501XA Injury of conjunctiva and corneal abrasion without foreign body, right eye, initial encounter: Secondary | ICD-10-CM | POA: Diagnosis not present

## 2015-10-19 DIAGNOSIS — H01021 Squamous blepharitis right upper eyelid: Secondary | ICD-10-CM | POA: Diagnosis not present

## 2015-10-21 DIAGNOSIS — H01021 Squamous blepharitis right upper eyelid: Secondary | ICD-10-CM | POA: Diagnosis not present

## 2015-10-21 DIAGNOSIS — S0502XD Injury of conjunctiva and corneal abrasion without foreign body, left eye, subsequent encounter: Secondary | ICD-10-CM | POA: Diagnosis not present

## 2015-10-21 DIAGNOSIS — H01024 Squamous blepharitis left upper eyelid: Secondary | ICD-10-CM | POA: Diagnosis not present

## 2015-10-21 DIAGNOSIS — H16142 Punctate keratitis, left eye: Secondary | ICD-10-CM | POA: Diagnosis not present

## 2015-10-28 DIAGNOSIS — H01024 Squamous blepharitis left upper eyelid: Secondary | ICD-10-CM | POA: Diagnosis not present

## 2015-10-28 DIAGNOSIS — H16142 Punctate keratitis, left eye: Secondary | ICD-10-CM | POA: Diagnosis not present

## 2015-10-28 DIAGNOSIS — H01021 Squamous blepharitis right upper eyelid: Secondary | ICD-10-CM | POA: Diagnosis not present

## 2015-10-30 DIAGNOSIS — H16232 Neurotrophic keratoconjunctivitis, left eye: Secondary | ICD-10-CM | POA: Diagnosis not present

## 2015-10-30 DIAGNOSIS — H01001 Unspecified blepharitis right upper eyelid: Secondary | ICD-10-CM | POA: Diagnosis not present

## 2015-11-20 DIAGNOSIS — H01001 Unspecified blepharitis right upper eyelid: Secondary | ICD-10-CM | POA: Diagnosis not present

## 2015-11-20 DIAGNOSIS — H16232 Neurotrophic keratoconjunctivitis, left eye: Secondary | ICD-10-CM | POA: Diagnosis not present

## 2015-11-21 DIAGNOSIS — J0100 Acute maxillary sinusitis, unspecified: Secondary | ICD-10-CM | POA: Diagnosis not present

## 2015-11-27 DIAGNOSIS — H35431 Paving stone degeneration of retina, right eye: Secondary | ICD-10-CM | POA: Diagnosis not present

## 2015-11-27 DIAGNOSIS — H43811 Vitreous degeneration, right eye: Secondary | ICD-10-CM | POA: Diagnosis not present

## 2015-11-27 DIAGNOSIS — H35373 Puckering of macula, bilateral: Secondary | ICD-10-CM | POA: Diagnosis not present

## 2015-12-10 ENCOUNTER — Other Ambulatory Visit: Payer: Self-pay

## 2015-12-10 MED ORDER — FENOFIBRATE 160 MG PO TABS: 160.0000 mg | ORAL_TABLET | Freq: Every day | ORAL | Status: DC

## 2015-12-16 DIAGNOSIS — H01024 Squamous blepharitis left upper eyelid: Secondary | ICD-10-CM | POA: Diagnosis not present

## 2015-12-16 DIAGNOSIS — H35373 Puckering of macula, bilateral: Secondary | ICD-10-CM | POA: Diagnosis not present

## 2015-12-16 DIAGNOSIS — H01021 Squamous blepharitis right upper eyelid: Secondary | ICD-10-CM | POA: Diagnosis not present

## 2016-01-01 ENCOUNTER — Emergency Department (HOSPITAL_COMMUNITY)
Admission: EM | Admit: 2016-01-01 | Discharge: 2016-01-01 | Disposition: A | Discharge status: Home / Self Care | Payer: Medicare Other | Attending: Emergency Medicine | Admitting: Emergency Medicine

## 2016-01-01 ENCOUNTER — Emergency Department (HOSPITAL_COMMUNITY): Payer: Medicare Other

## 2016-01-01 ENCOUNTER — Encounter (HOSPITAL_COMMUNITY): Payer: Self-pay | Admitting: Emergency Medicine

## 2016-01-01 DIAGNOSIS — R5383 Other fatigue: Secondary | ICD-10-CM | POA: Diagnosis not present

## 2016-01-01 DIAGNOSIS — Z8679 Personal history of other diseases of the circulatory system: Secondary | ICD-10-CM | POA: Diagnosis not present

## 2016-01-01 DIAGNOSIS — R109 Unspecified abdominal pain: Secondary | ICD-10-CM | POA: Diagnosis not present

## 2016-01-01 DIAGNOSIS — R110 Nausea: Secondary | ICD-10-CM | POA: Diagnosis not present

## 2016-01-01 DIAGNOSIS — I517 Cardiomegaly: Secondary | ICD-10-CM | POA: Diagnosis not present

## 2016-01-01 DIAGNOSIS — I10 Essential (primary) hypertension: Secondary | ICD-10-CM | POA: Diagnosis not present

## 2016-01-01 DIAGNOSIS — M1990 Unspecified osteoarthritis, unspecified site: Secondary | ICD-10-CM | POA: Insufficient documentation

## 2016-01-01 DIAGNOSIS — R1084 Generalized abdominal pain: Secondary | ICD-10-CM | POA: Diagnosis not present

## 2016-01-01 DIAGNOSIS — K219 Gastro-esophageal reflux disease without esophagitis: Secondary | ICD-10-CM | POA: Insufficient documentation

## 2016-01-01 DIAGNOSIS — J159 Unspecified bacterial pneumonia: Secondary | ICD-10-CM | POA: Insufficient documentation

## 2016-01-01 DIAGNOSIS — R140 Abdominal distension (gaseous): Secondary | ICD-10-CM | POA: Insufficient documentation

## 2016-01-01 DIAGNOSIS — R404 Transient alteration of awareness: Secondary | ICD-10-CM | POA: Diagnosis not present

## 2016-01-01 DIAGNOSIS — I252 Old myocardial infarction: Secondary | ICD-10-CM | POA: Diagnosis not present

## 2016-01-01 DIAGNOSIS — Z8546 Personal history of malignant neoplasm of prostate: Secondary | ICD-10-CM | POA: Diagnosis not present

## 2016-01-01 DIAGNOSIS — I712 Thoracic aortic aneurysm, without rupture, unspecified: Secondary | ICD-10-CM

## 2016-01-01 DIAGNOSIS — I2510 Atherosclerotic heart disease of native coronary artery without angina pectoris: Secondary | ICD-10-CM | POA: Diagnosis not present

## 2016-01-01 DIAGNOSIS — H9190 Unspecified hearing loss, unspecified ear: Secondary | ICD-10-CM | POA: Insufficient documentation

## 2016-01-01 DIAGNOSIS — Z87442 Personal history of urinary calculi: Secondary | ICD-10-CM | POA: Diagnosis not present

## 2016-01-01 DIAGNOSIS — Z87891 Personal history of nicotine dependence: Secondary | ICD-10-CM | POA: Diagnosis not present

## 2016-01-01 DIAGNOSIS — J189 Pneumonia, unspecified organism: Secondary | ICD-10-CM

## 2016-01-01 DIAGNOSIS — Z79899 Other long term (current) drug therapy: Secondary | ICD-10-CM | POA: Diagnosis not present

## 2016-01-01 DIAGNOSIS — R0602 Shortness of breath: Secondary | ICD-10-CM | POA: Diagnosis not present

## 2016-01-01 DIAGNOSIS — IMO0001 Reserved for inherently not codable concepts without codable children: Secondary | ICD-10-CM

## 2016-01-01 DIAGNOSIS — R143 Flatulence: Secondary | ICD-10-CM | POA: Diagnosis not present

## 2016-01-01 DIAGNOSIS — Z7982 Long term (current) use of aspirin: Secondary | ICD-10-CM | POA: Diagnosis not present

## 2016-01-01 DIAGNOSIS — R531 Weakness: Secondary | ICD-10-CM | POA: Diagnosis not present

## 2016-01-01 DIAGNOSIS — J439 Emphysema, unspecified: Secondary | ICD-10-CM | POA: Diagnosis not present

## 2016-01-01 HISTORY — DX: Unspecified right bundle-branch block: I45.10

## 2016-01-01 LAB — CBC WITH DIFFERENTIAL/PLATELET
Basophils Absolute: 0.1 10*3/uL (ref 0.0–0.1)
Basophils Relative: 1 %
Eosinophils Absolute: 0.1 10*3/uL (ref 0.0–0.7)
Eosinophils Relative: 2 %
HEMATOCRIT: 40.6 % (ref 39.0–52.0)
Hemoglobin: 12.9 g/dL — ABNORMAL LOW (ref 13.0–17.0)
LYMPHS PCT: 14 %
Lymphs Abs: 1 10*3/uL (ref 0.7–4.0)
MCH: 28.1 pg (ref 26.0–34.0)
MCHC: 31.8 g/dL (ref 30.0–36.0)
MCV: 88.5 fL (ref 78.0–100.0)
MONO ABS: 0.6 10*3/uL (ref 0.1–1.0)
MONOS PCT: 9 %
NEUTROS ABS: 5 10*3/uL (ref 1.7–7.7)
Neutrophils Relative %: 74 %
Platelets: 259 10*3/uL (ref 150–400)
RBC: 4.59 MIL/uL (ref 4.22–5.81)
RDW: 14 % (ref 11.5–15.5)
WBC: 6.7 10*3/uL (ref 4.0–10.5)

## 2016-01-01 LAB — URINALYSIS, ROUTINE W REFLEX MICROSCOPIC
Bilirubin Urine: NEGATIVE
Glucose, UA: NEGATIVE mg/dL
Hgb urine dipstick: NEGATIVE
KETONES UR: NEGATIVE mg/dL
LEUKOCYTES UA: NEGATIVE
NITRITE: NEGATIVE
PH: 7.5 (ref 5.0–8.0)
Protein, ur: NEGATIVE mg/dL
SPECIFIC GRAVITY, URINE: 1.011 (ref 1.005–1.030)

## 2016-01-01 LAB — D-DIMER, QUANTITATIVE: D-Dimer, Quant: 3.59 ug/mL-FEU — ABNORMAL HIGH (ref 0.00–0.50)

## 2016-01-01 LAB — TROPONIN I: Troponin I: <0.03 ng/mL (ref <0.031)

## 2016-01-01 LAB — COMPREHENSIVE METABOLIC PANEL
ALBUMIN: 3.9 g/dL (ref 3.5–5.0)
ALT: 15 U/L — AB (ref 17–63)
AST: 16 U/L (ref 15–41)
Alkaline Phosphatase: 52 U/L (ref 38–126)
Anion gap: 6 (ref 5–15)
BILIRUBIN TOTAL: 1.1 mg/dL (ref 0.3–1.2)
BUN: 16 mg/dL (ref 6–20)
CO2: 25 mmol/L (ref 22–32)
CREATININE: 0.98 mg/dL (ref 0.61–1.24)
Calcium: 10.2 mg/dL (ref 8.9–10.3)
Chloride: 105 mmol/L (ref 101–111)
GFR calc Af Amer: >60 mL/min (ref >60)
GFR calc non Af Amer: >60 mL/min (ref >60)
GLUCOSE: 106 mg/dL — AB (ref 65–99)
POTASSIUM: 3.8 mmol/L (ref 3.5–5.1)
Sodium: 136 mmol/L (ref 135–145)
TOTAL PROTEIN: 6.3 g/dL — AB (ref 6.5–8.1)

## 2016-01-01 LAB — BRAIN NATRIURETIC PEPTIDE: B Natriuretic Peptide: 25.9 pg/mL (ref 0.0–100.0)

## 2016-01-01 MED ORDER — IOPAMIDOL (ISOVUE-370) INJECTION 76%
INTRAVENOUS | Status: AC
  Administered 2016-01-01: 100 mL
  Filled 2016-01-01: qty 100

## 2016-01-01 MED ORDER — ALBUTEROL SULFATE HFA 108 (90 BASE) MCG/ACT IN AERS
2.0000 | INHALATION_SPRAY | RESPIRATORY_TRACT | Status: AC
  Administered 2016-01-01: 2 via RESPIRATORY_TRACT
  Filled 2016-01-01: qty 6.7

## 2016-01-01 MED ORDER — LEVOFLOXACIN 750 MG PO TABS
750.0000 mg | ORAL_TABLET | Freq: Once | ORAL | Status: AC
  Administered 2016-01-01: 750 mg via ORAL
  Filled 2016-01-01: qty 1

## 2016-01-01 MED ORDER — LEVOFLOXACIN 750 MG PO TABS: 750.0000 mg | ORAL_TABLET | Freq: Every day | ORAL | Status: DC

## 2016-01-01 NOTE — ED Notes (Signed)
Patient 02 sat was 99% ra while walking in room.

## 2016-01-01 NOTE — Discharge Instructions (Signed)
Take the prescription as directed.  Use your albuterol inhaler (2 to 4 puffs) every 4 hours for the next 7 days, then as needed for cough, wheezing, or shortness of breath. Your CT scan showed 2 incidental findings: "Moderate to marked cardiomegaly with biventricular enlarged." "Approximate 4.5 cm descending thoracic aortic aneurysm. Recommend annual imaging followup by CTA or MRA. This recommendation follows 2010 ACCF/AHA/AATS/ACR/ASA/SCA/SCAI/SIR/STS/SVM Guidelines for the Diagnosis and Management of Patients with Thoracic Aortic Disease. Circulation. 2010; [id number]."  Call your regular medical doctor today to schedule a follow up appointment within the next 3 days for your pneumonia. The Cardiologist has ordered an outpatient echocardiogram of your heart. Call your Cardiologist today to schedule a follow up appointment within the next week regarding your cardiomegaly ("enlarged heart"). Call your Cardiothoracic Surgeon today to schedule a follow up appointment regarding your thoracic aneurysm within the next week. Return to the Emergency Department immediately sooner if worsening.

## 2016-01-01 NOTE — ED Notes (Signed)
Pt transported to xray 

## 2016-01-01 NOTE — ED Notes (Signed)
Per [facility], pt from pcp. Off and on x1 week SOB and weakness, seen at PCP today, did bloodwork and EKG, bloodwork normal, ekg changed since last one in November. Denies any symptoms at this time, denies SOB, CP. 12 lead same by ems from PCP office. Shows BBB with inverted T waves. VSS. Hx of CAD, hernia, prostate cancer. AAOX4, in NAD

## 2016-01-01 NOTE — ED Provider Notes (Signed)
CSN: [id number]     Arrival date & time 01/01/16  1128 History   First MD Initiated Contact with Patient 01/01/16 1155     Chief Complaint  Patient presents with  . Abnormal ECG  . Shortness of Breath     HPI Pt was seen at 1200. Per pt, c/o gradual onset and persistence of constant generalized weakness for the past several weeks. Has been associated with feeling "gassy," and having intermittent SOB. States he feels more SOB when he plays his saxophone. Pt was evaluated by his PMD today, then sent to the ED for further evaluation. Denies CP/palpitations, no cough, no calf/LE pain or unilateral swelling, no abd pain, no N/V/D, no back pain, no focal motor weakness, no tingling/numbness in extremities, no fevers.   Cards: Dr. Burt Knack Vasc: Dr. Donnetta Hutching Past Medical History  Diagnosis Date  . Coronary artery disease   . Hyperlipidemia   . Hypertension   . AAA (abdominal aortic aneurysm) ([place])   . Arthritis   . Bronchitis     hx of appro 40 years ago  . Kidney stone 1968  . Myocardial infarction ([place]) 1992  . Pneumonia     hx of  . Cancer ([place]) 2010    prostate ( 40 Txs. of radiation treatments)  . GERD (gastroesophageal reflux disease)   . Environmental allergies   . HOH (hard of hearing)     wears bilateral hearing aids  . RBBB    Past Surgical History  Procedure Laterality Date  . Repair of ventral hernia  04-20-1994    P. Cameron Sprang MD  . Abdominal aortic aneurysm repair  08-13-1993    Dr Cameron Sprang  . Coronary artery bypass graft  03-01-1991    Dr. Servando Snare  . Repair of bowel obstruction  1999    Dr. Dalbert Batman  . Colonoscopy w/ polypectomy    . Hernia repair    . Resection femoral artery  03/19/2012    Dr. [name] Early  . False aneurysm repair  05/07/2012    Procedure: REPAIR FALSE ANEURYSM;  Surgeon: Rosetta Posner, MD;  Location: [facility] OR;  Service: Vascular;  Laterality: Left;  Repair of Left Femoral Artery Aneurysm  . Eye surgery      Detached retina (Left eye);  bilateral cataract removal  . Cardiac catheterization    . Orif patella Right 06/09/2015    Procedure: OPEN REDUCTION INTERNAL (ORIF) FIXATION PATELLA;  Surgeon: Marybelle Killings, MD;  Location: [place];  Service: Orthopedics;  Laterality: Right;   Family History  Problem Relation Age of Onset  . Cancer Mother   . Cancer Father   . Varicose Veins Father   . Cancer Sister   . Diabetes Sister   . Heart attack Sister    Social History  Substance Use Topics  . Smoking status: Former Smoker -- 20 years    Types: Cigarettes    Quit date: 07/25/1968  . Smokeless tobacco: Never Used  . Alcohol Use: No    Review of Systems ROS: Statement: All systems negative except as marked or noted in the HPI; Constitutional: Negative for fever and chills. +generalized weakness/fatigue.; ; Eyes: Negative for eye pain, redness and discharge. ; ; ENMT: Negative for ear pain, hoarseness, nasal congestion, sinus pressure and sore throat. ; ; Cardiovascular: Negative for chest pain, palpitations, diaphoresis, and peripheral edema. ; ; Respiratory: +SOB. Negative for cough, wheezing and stridor. ; ; Gastrointestinal: +"feels gassy." Negative for nausea, vomiting, diarrhea, abdominal pain, blood in  stool, hematemesis, jaundice and rectal bleeding. . ; ; Genitourinary: Negative for dysuria, flank pain and hematuria. ; ; Musculoskeletal: Negative for back pain and neck pain. Negative for swelling and trauma.; ; Skin: Negative for pruritus, rash, abrasions, blisters, bruising and skin lesion.; ; Neuro: Negative for headache, lightheadedness and neck stiffness. Negative for weakness, altered level of consciousness, altered mental status, extremity weakness, paresthesias, involuntary movement, seizure and syncope.      Allergies  Lactose intolerance (gi) and Adhesive  Home Medications   Prior to Admission medications   Medication Sig Start Date End Date Taking? Authorizing Provider  Alum Hydroxide-Mag Carbonate (GAVISCON  PO) Take 1 tablet by mouth daily as needed (upset stomach).     Historical Provider, MD  aspirin EC 325 MG tablet Take 1 tablet (325 mg total) by mouth daily. 06/11/15   Lanae Crumbly, PA-C  carvedilol (COREG) 12.5 MG tablet Take 12.5 mg by mouth 2 (two) times daily with a meal.    Historical Provider, MD  carvedilol (COREG) 12.5 MG tablet TAKE 1 BY MOUTH TWICE DAILY 07/24/15   [name], MD  dicyclomine (BENTYL) 20 MG tablet Take 1 tablet (20 mg total) by mouth 2 (two) times daily. 05/28/15   April Palumbo, MD  fenofibrate 160 MG tablet Take 1 tablet (160 mg total) by mouth daily. 12/10/15   [name], MD  hydrochlorothiazide (HYDRODIURIL) 12.5 MG tablet Take 12.5 mg by mouth daily.    Historical Provider, MD  Lactase (DAIRY DIGESTIVE SUPPLEMENT PO) Take 1 tablet by mouth daily as needed (when eating foods containing lactose).     Historical Provider, MD  loratadine (CLARITIN) 10 MG tablet Take 10 mg by mouth daily.     Historical Provider, MD  losartan (COZAAR) 50 MG tablet Take 50 mg by mouth daily.    Historical Provider, MD  omeprazole (PRILOSEC) 20 MG capsule Take 1 capsule (20 mg total) by mouth daily. 05/28/15   April Palumbo, MD  oxyCODONE-acetaminophen (PERCOCET/ROXICET) 5-325 MG tablet Take 1 tablet by mouth every 6 (six) hours as needed for severe pain. 06/11/15   Lanae Crumbly, PA-C  Polyvinyl Alcohol-Povidone (REFRESH OP) Apply 1 drop to eye 3 (three) times daily as needed. For dry eyes    Historical Provider, MD  Probiotic Product (PROBIOTIC PO) Take 1 capsule by mouth daily.    Historical Provider, MD  simvastatin (ZOCOR) 40 MG tablet Take 40 mg by mouth daily at 6 PM.    Historical Provider, MD   BP 141/86 mmHg  Pulse 60  Temp(Src) 97.6 F (36.4 C) (Oral)  Resp 12  SpO2 98%   12:29 Orthostatic Vital Signs JP  Orthostatic Lying  - BP- Lying: 142/88 mmHg ; Pulse- Lying: 62  Orthostatic Sitting - BP- Sitting: 128/94 mmHg ; Pulse- Sitting: 67  Orthostatic Standing at 0  minutes - BP- Standing at 0 minutes: 114/81 mmHg ; Pulse- Standing at 0 minutes: 72      Physical Exam  1205: Physical examination:  Nursing notes reviewed; Vital signs and O2 SAT reviewed;  Constitutional: Well developed, Well nourished, Well hydrated, In no acute distress; Head:  Normocephalic, atraumatic; Eyes: EOMI, PERRL, No scleral icterus; ENMT: Mouth and pharynx normal, Mucous membranes moist; Neck: Supple, Full range of motion, No lymphadenopathy; Cardiovascular: Regular rate and rhythm, No gallop; Respiratory: Breath sounds clear & equal bilaterally, No wheezes.  Speaking full sentences with ease, Normal respiratory effort/excursion; Chest: Nontender, Movement normal; Abdomen: Soft, Nontender, Nondistended, Normal bowel sounds; Genitourinary: No CVA  tenderness; Extremities: Pulses normal, No tenderness, No edema, No calf edema or asymmetry.; Neuro: AA&Ox3, Major CN grossly intact.  Speech clear. No gross focal motor or sensory deficits in extremities.; Skin: Color normal, Warm, Dry.   ED Course  Procedures (including critical care time) Labs Review  Imaging Review  I have personally reviewed and evaluated these images and lab results as part of my medical decision-making.   EKG Interpretation   Date/Time:  Friday January 01 2016 11:35:49 EDT Ventricular Rate:  64 PR Interval:  208 QRS Duration: 142 QT Interval:  430 QTC Calculation: 444 R Axis:   -64 Text Interpretation:  Sinus rhythm with 1st degree A-V block Atrial  premature complex LVH with IVCD, LAD and secondary repol abnrm Inferior  infarct, age indeterminate When compared with ECG of 05/28/2015 and  05/08/2013 No significant change was found Confirmed by [facility]  MD,  Nunzio Cory [phone]) on 01/01/2016 12:15:41 PM      MDM  MDM Reviewed: previous chart, nursing note and vitals Reviewed previous: labs and ECG Interpretation: labs, ECG, x-ray and CT scan   Results for orders placed or performed during the hospital  encounter of 01/01/16  Urinalysis, Routine w reflex microscopic  Result Value Ref Range   Color, Urine YELLOW YELLOW   APPearance CLEAR CLEAR   Specific Gravity, Urine 1.011 1.005 - 1.030   pH 7.5 5.0 - 8.0   Glucose, UA NEGATIVE NEGATIVE mg/dL   Hgb urine dipstick NEGATIVE NEGATIVE   Bilirubin Urine NEGATIVE NEGATIVE   Ketones, ur NEGATIVE NEGATIVE mg/dL   Protein, ur NEGATIVE NEGATIVE mg/dL   Nitrite NEGATIVE NEGATIVE   Leukocytes, UA NEGATIVE NEGATIVE  Comprehensive metabolic panel  Result Value Ref Range   Sodium 136 135 - 145 mmol/L   Potassium 3.8 3.5 - 5.1 mmol/L   Chloride 105 101 - 111 mmol/L   CO2 25 22 - 32 mmol/L   Glucose, Bld 106 (H) 65 - 99 mg/dL   BUN 16 6 - 20 mg/dL   Creatinine, Ser 0.98 0.61 - 1.24 mg/dL   Calcium 10.2 8.9 - 10.3 mg/dL   Total Protein 6.3 (L) 6.5 - 8.1 g/dL   Albumin 3.9 3.5 - 5.0 g/dL   AST 16 15 - 41 U/L   ALT 15 (L) 17 - 63 U/L   Alkaline Phosphatase 52 38 - 126 U/L   Total Bilirubin 1.1 0.3 - 1.2 mg/dL   GFR calc non Af Amer >60 >60 mL/min   GFR calc Af Amer >60 >60 mL/min   Anion gap 6 5 - 15  Troponin I  Result Value Ref Range   Troponin I <0.03 <0.031 ng/mL  CBC with Differential  Result Value Ref Range   WBC 6.7 4.0 - 10.5 K/uL   RBC 4.59 4.22 - 5.81 MIL/uL   Hemoglobin 12.9 (L) 13.0 - 17.0 g/dL   HCT 40.6 39.0 - 52.0 %   MCV 88.5 78.0 - 100.0 fL   MCH 28.1 26.0 - 34.0 pg   MCHC 31.8 30.0 - 36.0 g/dL   RDW 14.0 11.5 - 15.5 %   Platelets 259 150 - 400 K/uL   Neutrophils Relative % 74 %   Neutro Abs 5.0 1.7 - 7.7 K/uL   Lymphocytes Relative 14 %   Lymphs Abs 1.0 0.7 - 4.0 K/uL   Monocytes Relative 9 %   Monocytes Absolute 0.6 0.1 - 1.0 K/uL   Eosinophils Relative 2 %   Eosinophils Absolute 0.1 0.0 - 0.7 K/uL  Basophils Relative 1 %   Basophils Absolute 0.1 0.0 - 0.1 K/uL  Brain natriuretic peptide  Result Value Ref Range   B Natriuretic Peptide 25.9 0.0 - 100.0 pg/mL  D-dimer, quantitative  Result Value Ref Range    D-Dimer, Quant 3.59 (H) 0.00 - 0.50 ug/mL-FEU   Dg Chest 2 View 01/01/2016  CLINICAL DATA:  82 year old male with a history of EKG changes. Shortness of breath EXAM: CHEST  2 VIEW COMPARISON:  06/09/2015 FINDINGS: Cardiomediastinal silhouette unchanged with cardiomegaly. Surgical changes of median sternotomy and CABG. No evidence of central vascular congestion. Coarsened interstitial markings, similar to the prior. Architectural distortion at the left base is again noted. Stigmata of emphysema, with increased retrosternal airspace, flattened hemidiaphragms, increased AP diameter, and hyperinflation on the AP view. No pleural effusion or pneumothorax.  No confluent airspace disease. IMPRESSION: Chronic lung changes and emphysema with no evidence of acute cardiopulmonary disease. Surgical changes of median sternotomy and CABG. Signed, Dulcy Fanny. Earleen Newport, DO [facility] Radiology Electronically Signed   By: Corrie Mckusick D.O.   On: 01/01/2016 12:30   Ct Angio Chest Pe W/cm &/or Wo Cm 01/01/2016  CLINICAL DATA:  82 year old presenting with intermittent shortness of breath for the past several days which acutely worsened this morning. Intermittent cough. Elevated D-dimer. Prior CABG in 1992 and prior abdominal aortic aneurysm repair in 1995. EXAM: CT ANGIOGRAPHY CHEST WITH CONTRAST TECHNIQUE: Multidetector CT imaging of the chest was performed using the standard protocol during bolus administration of intravenous contrast. Multiplanar CT image reconstructions and MIPs were obtained to evaluate the vascular anatomy. CONTRAST:  100 ml Isovue 370 IV. COMPARISON:  No prior CT.  Multiple prior chest x-rays. FINDINGS: Technical quality:  Very good. Pulmonary embolism:  Absent. Cardiovascular: Prior sternotomy for CABG. Heart moderately to markedly enlarged. Mild left ventricular enlargement and possible mild left ventricular hypertrophy. Right ventricular enlargement and right  ventricular hypertrophy. No pericardial effusion. Moderate atherosclerosis involving the thoracic and upper abdominal aorta with evidence of the descending thoracic aortic aneurysm with maximum diameter approximating 4.5 cm. Mediastinum/Lymph Nodes: No pathologically enlarged mediastinal, hilar or axillary lymph nodes. No mediastinal masses. Normal-appearing esophagus. Visualized thyroid gland unremarkable. Lungs/Pleura: Streaky and patchy opacities in the left lower lobe. Lungs otherwise clear. No evidence of interstitial pulmonary edema. No pulmonary parenchymal nodules or masses. No pleural effusions. Central airways patent with moderate to marked bronchial wall thickening. Upper abdomen: Numerous calcified gallstones without evidence of acute cholecystitis. No acute abnormalities. Musculoskeletal: Severe osseous demineralization. Exaggeration of the usual kyphosis. Mild degenerative disc disease and spondylosis involving the mid and lower thoracic spine. Review of the MIP images confirms the above findings. IMPRESSION: 1. No evidence of pulmonary embolism. 2. Moderate to marked cardiomegaly with biventricular enlarged. 3. Approximate 4.5 cm descending thoracic aortic aneurysm. Recommend annual imaging followup by CTA or MRA. This recommendation follows 2010 ACCF/AHA/AATS/ACR/ASA/SCA/SCAI/SIR/STS/SVM Guidelines for the Diagnosis and Management of Patients with Thoracic Aortic Disease. Circulation. 2010; 121: [id number]. 4. Left lower lobe atelectasis and/or bronchopneumonia. Moderate to severe changes of bronchitis and/or asthma. 5. Cholelithiasis without evidence of acute cholecystitis. Electronically Signed   By: Evangeline Dakin M.D.   On: 01/01/2016 15:10   Dg Abd 2 Views 01/01/2016  CLINICAL DATA:  Abdominal pain for 1 week.  Nausea. EXAM: ABDOMEN - 2 VIEW COMPARISON:  None. FINDINGS: Atelectasis seen in the left lung base. Lung bases otherwise normal. No free air, portal venous gas, or pneumatosis. Surgical  clips are seen within the  mid central abdomen. Surgical clips are seen in the pelvis is well. There is no evidence of bowel obstruction on this study. Both kidneys are largely obscured by bowel contents but no renal stones are identified. IMPRESSION: No acute abnormalities. Electronically Signed   By: Dorise Bullion III M.D   On: 01/01/2016 12:57      1310:  EKG is unchanged from 2014 and 2016 EKG's on file. Pt not orthostatic on VS. Pt ambulated with Sats remaining 99% R/A. D-dimer elevated; will obtain CT-A chest.   1540:   Pt states he continues to "feel ok" and wants to go home now. Pt has ambulated around his exam room and in the hallways with steady gait, easy resps, NAD. Denies CP/SOB. CT findings as above. No recent echocardiogram found in EPIC. T/C to Cards Dr. Oval Linsey, case discussed, including:  HPI, pertinent PM/SHx, VS/PE, dx testing, ED course and treatment:  Agrees pt can be d/c home to f/u in office, she will order outpatient echocardiogram.   1620:   T/C to CTS Dr. Prescott Gum, case discussed, including:  HPI, pertinent PM/SHx, VS/PE, dx testing, ED course and treatment: have pt call office to f/u with Dr. Servando Snare. Dx and testing, as well as d/w Cards MD and CTS MD, d/w pt and family.  Questions answered.  Verb understanding, agreeable to d/c home with outpt f/u.   Francine Graven, DO 01/04/16 1557

## 2016-01-04 ENCOUNTER — Telehealth: Payer: Self-pay | Admitting: Cardiovascular Disease

## 2016-01-04 NOTE — Telephone Encounter (Signed)
Pt scheduled to see Dr Burt Knack 01/11/16 for post hospital follow-up. Per ER notes.

## 2016-01-04 NOTE — Telephone Encounter (Signed)
New Message  Pt calling to speak w/ RN- was seen @ [facility] for pneumonia- was told to follow up w/ Dr Burt Knack- pt stated had CT done @ [facility]. Please call back and discuss.

## 2016-01-05 ENCOUNTER — Telehealth: Payer: Self-pay

## 2016-01-05 ENCOUNTER — Other Ambulatory Visit (HOSPITAL_COMMUNITY): Payer: Medicare Other

## 2016-01-05 NOTE — Telephone Encounter (Signed)
Phone call from pt.  Reported he was seen in the ER recently for pneumonia, and in the findings of the CTA of chest, he was noted to have a Thoracic Aortic aneurysm.  Requested to have Dr. Donnetta Hutching be made aware of this.  Voiced concern about treatment of the above aneurysm.  Discussed with Dr. Donnetta Hutching.  CTA Chest film reviewed by Dr. Donnetta Hutching.  Advised to reassure pt. that this aneurysm needs to be monitored for changes, and that it likely will not require any surgery at this time.  Advised to see Dr. Servando Snare for monitoring of the Thoracic aneurysm, as was recommended by the ER physician.  Phone call returned to pt.  Left voice message with Dr. Luther Parody recommendation.  Encouraged to call the office with any questions.

## 2016-01-08 DIAGNOSIS — I517 Cardiomegaly: Secondary | ICD-10-CM | POA: Diagnosis not present

## 2016-01-08 DIAGNOSIS — I712 Thoracic aortic aneurysm, without rupture: Secondary | ICD-10-CM | POA: Diagnosis not present

## 2016-01-11 ENCOUNTER — Ambulatory Visit (INDEPENDENT_AMBULATORY_CARE_PROVIDER_SITE_OTHER): Payer: Medicare Other | Admitting: Cardiovascular Disease

## 2016-01-11 ENCOUNTER — Encounter: Payer: Self-pay | Admitting: Cardiovascular Disease

## 2016-01-11 ENCOUNTER — Encounter: Payer: Self-pay | Admitting: Cardiothoracic Surgery

## 2016-01-11 ENCOUNTER — Institutional Professional Consult (permissible substitution) (INDEPENDENT_AMBULATORY_CARE_PROVIDER_SITE_OTHER): Payer: Medicare Other | Admitting: Cardiothoracic Surgery

## 2016-01-11 VITALS — BP 102/63 | HR 72 | Resp 20 | Ht 72.0 in | Wt 179.0 lb

## 2016-01-11 VITALS — BP 118/74 | HR 63 | Ht 72.0 in | Wt 179.6 lb

## 2016-01-11 DIAGNOSIS — I2510 Atherosclerotic heart disease of native coronary artery without angina pectoris: Secondary | ICD-10-CM

## 2016-01-11 DIAGNOSIS — I712 Thoracic aortic aneurysm, without rupture: Secondary | ICD-10-CM | POA: Diagnosis not present

## 2016-01-11 DIAGNOSIS — R0602 Shortness of breath: Secondary | ICD-10-CM | POA: Diagnosis not present

## 2016-01-11 DIAGNOSIS — I7123 Aneurysm of the descending thoracic aorta, without rupture: Secondary | ICD-10-CM

## 2016-01-11 NOTE — Patient Instructions (Signed)

## 2016-01-11 NOTE — Progress Notes (Signed)
[address]       [place] [address]             [place] Record [id number] Date of Birth: 1927/10/16  Referring: Francine Graven, DO Primary Care: Tawanna Solo, MD  Chief Complaint:    Chief Complaint  Patient presents with  . Thoracic Aortic Aneurysm    Surgical eval,     History of Present Illness:    Jesse [name] 82 y.o. male is seen in the office  today for incidental finding of descending thoracic aneurysm. He has been followed by Dr Hedy Camara for abdominal aneurysm disease known since 1992 when he had CABG.  He was last seen in December 2016. He has had open abdominal aneurysm repair and bilateral femoral  aneurysm repair.       The patient is followed by Dr Burt Knack  for coronary artery disease status post remote CABG in 1992. He has a chronic right bundle branch block. The patient was evaluated in the emergency room last week for shortness of breath. He also had some lower abdominal discomfort. . The patient's d-dimer was elevated and a CT angiogram of the chest was done. 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   Current Activity/ Functional Status:  Patient is independent with mobility/ambulation, transfers, ADL's, IADL's.   Zubrod Score: At the time of surgery this patient's most appropriate activity status/level should be described as: []     0    Normal activity, no symptoms [x]     1    Restricted in physical strenuous activity but ambulatory, able to do out light work []     2    Ambulatory and capable of self care, unable to do work activities, up and about               >50 % of waking hours                              []     3    Only limited self care, in bed greater than 50% of waking hours []     4    Completely disabled, no self care, confined to bed or chair []     5    Moribund   Past Medical History    Diagnosis Date  . Coronary artery disease   . Hyperlipidemia   . Hypertension   . AAA (abdominal aortic aneurysm) ([place])   . Arthritis   . Bronchitis     hx of appro 40 years ago  . Kidney stone 1968  . Myocardial infarction ([place]) 1992  . Pneumonia     hx of  . Cancer ([place]) 2010    prostate ( 40 Txs. of radiation treatments)  . GERD (gastroesophageal reflux disease)   . Environmental allergies   . HOH (hard of hearing)     wears bilateral hearing aids  . RBBB     Past Surgical History  Procedure Laterality Date  . Repair of ventral hernia  04-20-1994    P. Cameron Sprang MD  . Abdominal aortic aneurysm repair  08-13-1993    Dr Cameron Sprang  . Coronary artery bypass graft  03-01-1991    Dr. Servando Snare  .  Repair of bowel obstruction  1999    Dr. Dalbert Batman  . Colonoscopy w/ polypectomy    . Hernia repair    . Resection femoral artery  03/19/2012    Dr. [name] Early  . False aneurysm repair  05/07/2012    Procedure: REPAIR FALSE ANEURYSM;  Surgeon: Rosetta Posner, MD;  Location: [facility] OR;  Service: Vascular;  Laterality: Left;  Repair of Left Femoral Artery Aneurysm  . Eye surgery      Detached retina (Left eye); bilateral cataract removal  . Cardiac catheterization    . Orif patella Right 06/09/2015    Procedure: OPEN REDUCTION INTERNAL (ORIF) FIXATION PATELLA;  Surgeon: Marybelle Killings, MD;  Location: [facility];  Service: Orthopedics;  Laterality: Right;    Family History  Problem Relation Age of Onset  . Cancer Mother   . Cancer Father   . Varicose Veins Father   . Cancer Sister   . Diabetes Sister   . Heart attack Sister     Social History   Social History  . Marital Status: Widowed    Spouse Name: N/A  . Number of Children: N/A  . Years of Education: N/A   Occupational History  . Not on file.   Social History Main Topics  . Smoking status: Former Smoker -- 20 years    Types: Cigarettes    Quit date: 07/25/1968  . Smokeless tobacco: Never Used  . Alcohol Use: No   . Drug Use: No  . Sexual Activity: Not on file   Other Topics Concern  . Not on file   Social History Narrative    History  Smoking status  . Former Smoker -- 20 years  . Types: Cigarettes  . Quit date: 07/25/1968  Smokeless tobacco  . Never Used    History  Alcohol Use No     Allergies  Allergen Reactions  . Lactose Intolerance (Gi) Other (See Comments)    Gas, bloating, stomach indigestion   . Adhesive [Tape] Itching and Rash    Current Outpatient Prescriptions  Medication Sig Dispense Refill  . acetaminophen (TYLENOL) 325 MG tablet Take 650 mg by mouth every 6 (six) hours as needed for mild pain.    Marland Kitchen Alum Hydroxide-Mag Carbonate (GAVISCON PO) Take 1 tablet by mouth daily as needed (upset stomach).     Marland Kitchen aspirin EC 325 MG tablet Take 1 tablet (325 mg total) by mouth daily. 30 tablet 0  . carboxymethylcellulose (REFRESH PLUS) 0.5 % SOLN Apply 1 drop to eye 3 (three) times daily as needed.    . carvedilol (COREG) 12.5 MG tablet Take 12.5 mg by mouth 2 (two) times daily with a meal.    . dicyclomine (BENTYL) 20 MG tablet Take 1 tablet (20 mg total) by mouth 2 (two) times daily. 20 tablet 0  . fenofibrate 160 MG tablet Take 1 tablet (160 mg total) by mouth daily. 90 tablet 2  . hydrochlorothiazide (HYDRODIURIL) 12.5 MG tablet Take 12.5 mg by mouth daily.    . Lactase (DAIRY DIGESTIVE SUPPLEMENT PO) Take 1 tablet by mouth daily as needed (when eating foods containing lactose).     Marland Kitchen levofloxacin (LEVAQUIN) 750 MG tablet Take 1 tablet (750 mg total) by mouth daily. For the next 4 days 4 tablet 0  . loratadine (CLARITIN) 10 MG tablet Take 10 mg by mouth daily.     Marland Kitchen losartan (COZAAR) 50 MG tablet Take 50 mg by mouth daily.    Marland Kitchen omeprazole (PRILOSEC) 20 MG  capsule Take 1 capsule (20 mg total) by mouth daily. 30 capsule 0  . oxyCODONE-acetaminophen (PERCOCET/ROXICET) 5-325 MG tablet Take 1 tablet by mouth every 6 (six) hours as needed for severe pain. 60 tablet 0  . Polyvinyl  Alcohol-Povidone (REFRESH OP) Apply 1 drop to eye 3 (three) times daily as needed. For dry eyes    . Probiotic Product (PROBIOTIC PO) Take 1 capsule by mouth daily.    . simvastatin (ZOCOR) 40 MG tablet Take 40 mg by mouth daily at 6 PM.     No current facility-administered medications for this visit.      Review of Systems:     Cardiac Review of Systems: Y or N  Chest Pain [    ]  Resting SOB [   ] Exertional SOB  [  ]  Orthopnea [  ]   Pedal Edema [   ]    Palpitations [  ] Syncope  [  ]   Presyncope [   ]  General Review of Systems: [Y] = yes [  ]=no Constitional: recent weight change [  ];  Wt loss over the last 3 months [   ] anorexia [  ]; fatigue [  ]; nausea [  ]; night sweats [  ]; fever [  ]; or chills [  ];          Dental: poor dentition[  ]; Last Dentist visit:   Eye : blurred vision [  ]; diplopia [   ]; vision changes [  ];  Amaurosis fugax[  ]; Resp: cough [  ];  wheezing[  ];  hemoptysis[  ]; shortness of breath[  ]; paroxysmal nocturnal dyspnea[  ]; dyspnea on exertion[  ]; or orthopnea[  ];  GI:  gallstones[  ], vomiting[  ];  dysphagia[  ]; melena[  ];  hematochezia [  ]; heartburn[  ];   Hx of  Colonoscopy[  ]; GU: kidney stones [  ]; hematuria[  ];   dysuria [  ];  nocturia[  ];  history of     obstruction [  ]; urinary frequency [  ]             Skin: rash, swelling[  ];, hair loss[  ];  peripheral edema[  ];  or itching[  ]; Musculosketetal: myalgias[  ];  joint swelling[  ];  joint erythema[  ];  joint pain[  ];  back pain[  ];  Heme/Lymph: bruising[  ];  bleeding[  ];  anemia[  ];  Neuro: TIA[  ];  headaches[  ];  stroke[  ];  vertigo[  ];  seizures[  ];   paresthesias[  ];  difficulty walking[  ];  Psych:depression[  ]; anxiety[  ];  Endocrine: diabetes[  ];  thyroid dysfunction[  ];  Immunizations: Flu up to date [  ]; Pneumococcal up to date [  ];  Other:  Physical Exam: BP 102/63 mmHg  Pulse 72  Resp 20  Ht 6' (1.829 m)  Wt 179 lb (81.194 kg)  BMI  24.27 kg/m2  SpO2 98%  PHYSICAL EXAMINATION: General appearance: alert, cooperative, appears stated age and no distress Head: Normocephalic, without obvious abnormality, atraumatic Neck: no adenopathy, no carotid bruit, no JVD, supple, symmetrical, trachea midline and thyroid not enlarged, symmetric, no tenderness/mass/nodules Lymph nodes: Cervical, supraclavicular, and axillary nodes normal. Resp: clear to auscultation bilaterally Back: symmetric, no curvature. ROM normal. No CVA tenderness. Cardio: regular rate and rhythm, S1,  S2 normal, no murmur, click, rub or gallop GI: abdominal ventral incisional hernia is present  Extremities: extremities normal, atraumatic, no cyanosis or edema and Homans sign is negative, no sign of DVT Neurologic: Grossly normal bilateral groin incisions well healed  bilateral popliteal artery pulse easily felt  dp and pt pulses present   Diagnostic Studies & Laboratory data:     Recent Radiology Findings:   Dg Chest 2 View  01/01/2016  CLINICAL DATA:  82 year old male with a history of EKG changes. Shortness of breath EXAM: CHEST  2 VIEW COMPARISON:  06/09/2015 FINDINGS: Cardiomediastinal silhouette unchanged with cardiomegaly. Surgical changes of median sternotomy and CABG. No evidence of central vascular congestion. Coarsened interstitial markings, similar to the prior. Architectural distortion at the left base is again noted. Stigmata of emphysema, with increased retrosternal airspace, flattened hemidiaphragms, increased AP diameter, and hyperinflation on the AP view. No pleural effusion or pneumothorax.  No confluent airspace disease. IMPRESSION: Chronic lung changes and emphysema with no evidence of acute cardiopulmonary disease. Surgical changes of median sternotomy and CABG. Signed, Dulcy Fanny. Earleen Newport, DO [facility] Radiology Electronically Signed   By: Corrie Mckusick D.O.   On: 01/01/2016 12:30   Ct Angio Chest  Pe W/cm &/or Wo Cm  01/01/2016  CLINICAL DATA:  82 year old presenting with intermittent shortness of breath for the past several days which acutely worsened this morning. Intermittent cough. Elevated D-dimer. Prior CABG in 1992 and prior abdominal aortic aneurysm repair in 1995. EXAM: CT ANGIOGRAPHY CHEST WITH CONTRAST TECHNIQUE: Multidetector CT imaging of the chest was performed using the standard protocol during bolus administration of intravenous contrast. Multiplanar CT image reconstructions and MIPs were obtained to evaluate the vascular anatomy. CONTRAST:  100 ml Isovue 370 IV. COMPARISON:  No prior CT.  Multiple prior chest x-rays. FINDINGS: Technical quality:  Very good. Pulmonary embolism:  Absent. Cardiovascular: Prior sternotomy for CABG. Heart moderately to markedly enlarged. Mild left ventricular enlargement and possible mild left ventricular hypertrophy. Right ventricular enlargement and right ventricular hypertrophy. No pericardial effusion. Moderate atherosclerosis involving the thoracic and upper abdominal aorta with evidence of the descending thoracic aortic aneurysm with maximum diameter approximating 4.5 cm. Mediastinum/Lymph Nodes: No pathologically enlarged mediastinal, hilar or axillary lymph nodes. No mediastinal masses. Normal-appearing esophagus. Visualized thyroid gland unremarkable. Lungs/Pleura: Streaky and patchy opacities in the left lower lobe. Lungs otherwise clear. No evidence of interstitial pulmonary edema. No pulmonary parenchymal nodules or masses. No pleural effusions. Central airways patent with moderate to marked bronchial wall thickening. Upper abdomen: Numerous calcified gallstones without evidence of acute cholecystitis. No acute abnormalities. Musculoskeletal: Severe osseous demineralization. Exaggeration of the usual kyphosis. Mild degenerative disc disease and spondylosis involving the mid and lower thoracic spine. Review of the MIP images confirms the above findings.  IMPRESSION: 1. No evidence of pulmonary embolism. 2. Moderate to marked cardiomegaly with biventricular enlarged. 3. Approximate 4.5 cm descending thoracic aortic aneurysm. Recommend annual imaging followup by CTA or MRA. This recommendation follows 2010 ACCF/AHA/AATS/ACR/ASA/SCA/SCAI/SIR/STS/SVM Guidelines for the Diagnosis and Management of Patients with Thoracic Aortic Disease. Circulation. 2010; 121: [id number]. 4. Left lower lobe atelectasis and/or bronchopneumonia. Moderate to severe changes of bronchitis and/or asthma. 5. Cholelithiasis without evidence of acute cholecystitis. Electronically Signed   By: Evangeline Dakin M.D.   On: 01/01/2016 15:10   Dg Abd 2 Views  01/01/2016  CLINICAL DATA:  Abdominal pain for 1 week.  Nausea. EXAM: ABDOMEN - 2 VIEW COMPARISON:  None. FINDINGS: Atelectasis seen in the left  lung base. Lung bases otherwise normal. No free air, portal venous gas, or pneumatosis. Surgical clips are seen within the mid central abdomen. Surgical clips are seen in the pelvis is well. There is no evidence of bowel obstruction on this study. Both kidneys are largely obscured by bowel contents but no renal stones are identified. IMPRESSION: No acute abnormalities. Electronically Signed   By: Dorise Bullion III M.D   On: 01/01/2016 12:57     I have independently reviewed the above radiologic studies.  Recent Lab Findings: Lab Results  Component Value Date   WBC 6.7 01/01/2016   HGB 12.9* 01/01/2016   HCT 40.6 01/01/2016   PLT 259 01/01/2016   GLUCOSE 106* 01/01/2016   CHOL 123 05/20/2014   TRIG 64.0 05/20/2014   HDL 28.90* 05/20/2014   LDLCALC 81 05/20/2014   ALT 15* 01/01/2016   AST 16 01/01/2016   NA 136 01/01/2016   K 3.8 01/01/2016   CL 105 01/01/2016   CREATININE 0.98 01/01/2016   BUN 16 01/01/2016   CO2 25 01/01/2016   INR 1.23 06/09/2015      Assessment / Plan:   Descending thoracic aortic aneurysm: 4.5 cm by CT scan. The patient has extensive aneurysmal  disease. He has undergone AAA repair  He has had bilateral  right femoral artery aneurysm repair by Dr. Donnetta Hutching. 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 With the current size at age 41 would recommend continued following the descending aorta, Patient has CTA already ordered by Dr Early for 04/2016 - will add cta of chest to same scan to reevaluate for rate of change .  Coronary artery disease, native vessel: The patient has no symptoms of angina. He is status post remote CABG x 25 years .    Essential hypertension: His blood pressure is controlled on  carvedilol, hydrochlorothiazide, and losartan.   Hyperlipidemia: The patient is treated with simvastatin.   I  spent 30 minutes counseling the patient face to face and 50% or more the  time was spent in counseling and coordination of care. The total time spent in the appointment was 40 minutes.  Grace Isaac MD      [address] [place] [address] Office [phone]   Beeper [phone]  01/11/2016 2:18 PM

## 2016-01-11 NOTE — Progress Notes (Signed)
Cardiology Office Note Date:  01/11/2016   ID:  Jesse [name], DOB [birth year], MRN [id number]  PCP:  Tawanna Solo, MD  Cardiologist:  [name], MD    No chief complaint on file.    History of Present Illness: Jesse [name] is a 82 y.o. male who presents for follow-up evaluation. The patient is followed for coronary artery disease status post remote CABG in 1992. He has a chronic right bundle branch block. The patient is followed by Dr. 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 He has been managed conservatively with medical therapy. He was last seen in December 2016.  The patient was evaluated in the emergency room last week for shortness of breath. He also had some lower abdominal discomfort. States that he thought these think should be checked out so he went to the emergency department. The patient's d-dimer was elevated and a CT angiogram of the chest was done. 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 He is scheduled to follow-up with Dr. Servando Snare later this afternoon. Other studies were unrevealing. The patient's chest x-ray was clear, his troponin and BNP levels were normal. His renal function and electrolytes were normal. CBC was essentially normal as well.  The patient complains of fatigue. He has no chest pain or pressure. He has shortness of breath with activity, but denies leg swelling, orthopnea, or PND. He's had no lightheadedness or syncope. There are no recent medicine changes. He's had no fevers, chills, or body aches.   Past Medical History  Diagnosis Date  . Coronary artery disease   . Hyperlipidemia   . Hypertension   . AAA (abdominal aortic aneurysm) ([place])   . Arthritis   . Bronchitis     hx of appro 40 years ago  . Kidney stone 1968  . Myocardial infarction ([place]) 1992  . Pneumonia     hx of  . Cancer ([place]) 2010    prostate ( 40 Txs. of  radiation treatments)  . GERD (gastroesophageal reflux disease)   . Environmental allergies   . HOH (hard of hearing)     wears bilateral hearing aids  . RBBB     Past Surgical History  Procedure Laterality Date  . Repair of ventral hernia  04-20-1994    P. Cameron Sprang MD  . Abdominal aortic aneurysm repair  08-13-1993    Dr Cameron Sprang  . Coronary artery bypass graft  03-01-1991    Dr. Servando Snare  . Repair of bowel obstruction  1999    Dr. Dalbert Batman  . Colonoscopy w/ polypectomy    . Hernia repair    . Resection femoral artery  03/19/2012    Dr. [name] Early  . False aneurysm repair  05/07/2012    Procedure: REPAIR FALSE ANEURYSM;  Surgeon: Rosetta Posner, MD;  Location: [facility] OR;  Service: Vascular;  Laterality: Left;  Repair of Left Femoral Artery Aneurysm  . Eye surgery      Detached retina (Left eye); bilateral cataract removal  . Cardiac catheterization    . Orif patella Right 06/09/2015    Procedure: OPEN REDUCTION INTERNAL (ORIF) FIXATION PATELLA;  Surgeon: Marybelle Killings, MD;  Location: [facility];  Service: Orthopedics;  Laterality: Right;    Current Outpatient Prescriptions  Medication Sig Dispense Refill  . acetaminophen (TYLENOL) 325 MG tablet Take 650 mg by mouth every 6 (six) hours as needed for mild pain.    Marland Kitchen  Alum Hydroxide-Mag Carbonate (GAVISCON PO) Take 1 tablet by mouth daily as needed (upset stomach).     Marland Kitchen aspirin EC 325 MG tablet Take 1 tablet (325 mg total) by mouth daily. 30 tablet 0  . carboxymethylcellulose (REFRESH PLUS) 0.5 % SOLN Apply 1 drop to eye 3 (three) times daily as needed.    . carvedilol (COREG) 12.5 MG tablet Take 12.5 mg by mouth 2 (two) times daily with a meal.    . dicyclomine (BENTYL) 20 MG tablet Take 1 tablet (20 mg total) by mouth 2 (two) times daily. 20 tablet 0  . fenofibrate 160 MG tablet Take 1 tablet (160 mg total) by mouth daily. 90 tablet 2  . hydrochlorothiazide (HYDRODIURIL) 12.5 MG tablet Take 12.5 mg by mouth daily.    . Lactase  (DAIRY DIGESTIVE SUPPLEMENT PO) Take 1 tablet by mouth daily as needed (when eating foods containing lactose).     Marland Kitchen levofloxacin (LEVAQUIN) 750 MG tablet Take 1 tablet (750 mg total) by mouth daily. For the next 4 days 4 tablet 0  . loratadine (CLARITIN) 10 MG tablet Take 10 mg by mouth daily.     Marland Kitchen losartan (COZAAR) 50 MG tablet Take 50 mg by mouth daily.    Marland Kitchen omeprazole (PRILOSEC) 20 MG capsule Take 1 capsule (20 mg total) by mouth daily. 30 capsule 0  . oxyCODONE-acetaminophen (PERCOCET/ROXICET) 5-325 MG tablet Take 1 tablet by mouth every 6 (six) hours as needed for severe pain. 60 tablet 0  . Polyvinyl Alcohol-Povidone (REFRESH OP) Apply 1 drop to eye 3 (three) times daily as needed. For dry eyes    . Probiotic Product (PROBIOTIC PO) Take 1 capsule by mouth daily.    . simvastatin (ZOCOR) 40 MG tablet Take 40 mg by mouth daily at 6 PM.     No current facility-administered medications for this visit.    Allergies:   Lactose intolerance (gi) and Adhesive   Social History:  The patient  reports that he quit smoking about 47 years ago. His smoking use included Cigarettes. He quit after 20 years of use. He has never used smokeless tobacco. He reports that he does not drink alcohol or use illicit drugs.   Family History:  The patient's  family history includes Cancer in his father, mother, and sister; Diabetes in his sister; Heart attack in his sister; Varicose Veins in his father.    ROS:  Please see the history of present illness.  Otherwise, review of systems is positive for abdominal pain, cough.  All other systems are reviewed and negative.    PHYSICAL EXAM: VS:  BP 100/50 mmHg  Pulse 63  Ht 6' (1.829 m)  Wt 179 lb 9.6 oz (81.466 kg)  BMI 24.35 kg/m2  SpO2 95% , BMI Body mass index is 24.35 kg/(m^2). GEN: Well nourished, well developed, pleasant elderly male in no acute distress HEENT: normal Neck: no JVD, no masses. No carotid bruits Cardiac: RRR without murmur or gallop                 Respiratory:  clear to auscultation bilaterally, normal work of breathing GI: soft, nontender, nondistended, + BS MS: no deformity or atrophy Ext: no pretibial edema Skin: warm and dry, no rash Neuro:  Strength and sensation are intact Psych: euthymic mood, full affect  EKG:  EKG is not ordered today. EKG 01/01/2016 shows sinus rhythm with right bundle branch block, PA-C, and possible age-indeterminate inferior infarct no significant change from previous tracing  Recent  Labs: Jan 13, 2016: ALT 15*; B Natriuretic Peptide 25.9; BUN 16; Creatinine, Ser 0.98; Hemoglobin 12.9*; Platelets 259; Potassium 3.8; Sodium 136   Lipid Panel     Component Value Date/Time   CHOL 123 05/20/2014 0833   TRIG 64.0 05/20/2014 0833   TRIG 84 05/05/2006 0839   HDL 28.90* 05/20/2014 0833   CHOLHDL 4 05/20/2014 0833   CHOLHDL 5.4 CALC 05/05/2006 0839   VLDL 12.8 05/20/2014 0833   LDLCALC 81 05/20/2014 0833      Wt Readings from Last 3 Encounters:  01/11/16 179 lb 9.6 oz (81.466 kg)  07/23/15 186 lb 3.2 oz (84.46 kg)  06/08/15 180 lb (81.647 kg)     Cardiac Studies Reviewed: CT Angio Chest 2016/01/13: IMPRESSION: 1. No evidence of pulmonary embolism. 2. Moderate to marked cardiomegaly with biventricular enlarged. 3. Approximate 4.5 cm descending thoracic aortic aneurysm. Recommend annual imaging followup by CTA or MRA. This recommendation follows 2010 ACCF/AHA/AATS/ACR/ASA/SCA/SCAI/SIR/STS/SVM Guidelines for the Diagnosis and Management of Patients with Thoracic Aortic Disease. Circulation. 2010; 121: [id number]. 4. Left lower lobe atelectasis and/or bronchopneumonia. Moderate to severe changes of bronchitis and/or asthma. 5. Cholelithiasis without evidence of acute cholecystitis.  ASSESSMENT AND PLAN: 1.  Coronary artery disease, native vessel: The patient has no symptoms of angina. He is status post remote CABG. Current medications will be continued.  2. Cardiomegaly/shortness of breath:  Incidental notation of cardiomegaly on his CT scan. Considering his generalized fatigue and shortness of breath, will check an echocardiogram. He has no exam evidence of volume overload and his recent lab evaluation was unremarkable with a normal BNP level.  3. Descending thoracic aortic aneurysm: 4.5 cm by CT scan. The patient has extensive aneurysmal disease. He has undergone AAA repair by Dr. [name]. He's had a right femoral artery aneurysm repair by Dr. Donnetta Hutching. 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  4. Essential hypertension: His blood pressure is controlled on a combination of carvedilol, hydrochlorothiazide, and losartan.  5. Hyperlipidemia: The patient is treated with simvastatin.  Current medicines are reviewed with the patient today.  The patient does not have concerns regarding medicines.  Labs/ tests ordered today include:  No orders of the defined types were placed in this encounter.   Disposition:   FU one year. If echo is abnormal will arrange close FU.  Deatra James, MD  01/11/2016 12:32 PM    [address], [place],   [address] Phone: [phone]; Fax: [phone]

## 2016-01-29 ENCOUNTER — Ambulatory Visit (HOSPITAL_COMMUNITY): Payer: Medicare Other | Attending: Cardiovascular Disease

## 2016-01-29 ENCOUNTER — Other Ambulatory Visit: Payer: Self-pay

## 2016-01-29 DIAGNOSIS — I4510 Unspecified right bundle-branch block: Secondary | ICD-10-CM | POA: Insufficient documentation

## 2016-01-29 DIAGNOSIS — R0602 Shortness of breath: Secondary | ICD-10-CM | POA: Diagnosis not present

## 2016-01-29 DIAGNOSIS — I119 Hypertensive heart disease without heart failure: Secondary | ICD-10-CM | POA: Diagnosis not present

## 2016-01-29 DIAGNOSIS — R0600 Dyspnea, unspecified: Secondary | ICD-10-CM | POA: Diagnosis present

## 2016-01-29 DIAGNOSIS — E785 Hyperlipidemia, unspecified: Secondary | ICD-10-CM | POA: Insufficient documentation

## 2016-01-29 DIAGNOSIS — I2510 Atherosclerotic heart disease of native coronary artery without angina pectoris: Secondary | ICD-10-CM | POA: Diagnosis not present

## 2016-03-02 DIAGNOSIS — G4700 Insomnia, unspecified: Secondary | ICD-10-CM | POA: Diagnosis not present

## 2016-03-18 DIAGNOSIS — Z0000 Encounter for general adult medical examination without abnormal findings: Secondary | ICD-10-CM | POA: Diagnosis not present

## 2016-03-18 DIAGNOSIS — Z8546 Personal history of malignant neoplasm of prostate: Secondary | ICD-10-CM | POA: Diagnosis not present

## 2016-03-18 DIAGNOSIS — I724 Aneurysm of artery of lower extremity: Secondary | ICD-10-CM | POA: Diagnosis not present

## 2016-03-18 DIAGNOSIS — R109 Unspecified abdominal pain: Secondary | ICD-10-CM | POA: Diagnosis not present

## 2016-03-18 DIAGNOSIS — H9113 Presbycusis, bilateral: Secondary | ICD-10-CM | POA: Diagnosis not present

## 2016-03-18 DIAGNOSIS — I517 Cardiomegaly: Secondary | ICD-10-CM | POA: Diagnosis not present

## 2016-03-18 DIAGNOSIS — Z8679 Personal history of other diseases of the circulatory system: Secondary | ICD-10-CM | POA: Diagnosis not present

## 2016-03-18 DIAGNOSIS — G4700 Insomnia, unspecified: Secondary | ICD-10-CM | POA: Diagnosis not present

## 2016-03-18 DIAGNOSIS — Z86010 Personal history of colonic polyps: Secondary | ICD-10-CM | POA: Diagnosis not present

## 2016-03-18 DIAGNOSIS — E782 Mixed hyperlipidemia: Secondary | ICD-10-CM | POA: Diagnosis not present

## 2016-04-01 ENCOUNTER — Other Ambulatory Visit: Payer: Self-pay | Admitting: *Deleted

## 2016-04-01 MED ORDER — SIMVASTATIN 40 MG PO TABS: 40.0000 mg | ORAL_TABLET | Freq: Every day | ORAL | 2 refills | Status: DC

## 2016-04-06 ENCOUNTER — Other Ambulatory Visit: Payer: Self-pay | Admitting: *Deleted

## 2016-04-06 MED ORDER — LOSARTAN POTASSIUM 50 MG PO TABS: 50.0000 mg | ORAL_TABLET | Freq: Every day | ORAL | 2 refills | Status: DC

## 2016-04-12 ENCOUNTER — Other Ambulatory Visit: Payer: Self-pay | Admitting: *Deleted

## 2016-04-12 DIAGNOSIS — I713 Abdominal aortic aneurysm, ruptured, unspecified: Secondary | ICD-10-CM

## 2016-04-12 DIAGNOSIS — I7123 Aneurysm of the descending thoracic aorta, without rupture: Secondary | ICD-10-CM

## 2016-04-12 DIAGNOSIS — I712 Thoracic aortic aneurysm, without rupture: Secondary | ICD-10-CM

## 2016-04-13 ENCOUNTER — Other Ambulatory Visit: Payer: Self-pay

## 2016-04-13 MED ORDER — HYDROCHLOROTHIAZIDE 12.5 MG PO TABS: 12.5000 mg | ORAL_TABLET | Freq: Every day | ORAL | 3 refills | Status: DC

## 2016-04-29 ENCOUNTER — Encounter: Payer: Self-pay | Admitting: Vascular Surgery

## 2016-05-03 ENCOUNTER — Encounter: Payer: Self-pay | Admitting: Vascular Surgery

## 2016-05-03 ENCOUNTER — Ambulatory Visit (HOSPITAL_COMMUNITY)
Admission: RE | Admit: 2016-05-03 | Discharge: 2016-05-03 | Disposition: A | Discharge status: Home / Self Care | Payer: Medicare Other | Source: Ambulatory Visit | Attending: Vascular Surgery | Admitting: Vascular Surgery

## 2016-05-03 ENCOUNTER — Ambulatory Visit (INDEPENDENT_AMBULATORY_CARE_PROVIDER_SITE_OTHER): Payer: Medicare Other | Admitting: Vascular Surgery

## 2016-05-03 ENCOUNTER — Ambulatory Visit (INDEPENDENT_AMBULATORY_CARE_PROVIDER_SITE_OTHER)
Admission: RE | Admit: 2016-05-03 | Discharge: 2016-05-03 | Disposition: A | Discharge status: Home / Self Care | Payer: Medicare Other | Source: Ambulatory Visit | Attending: Vascular Surgery | Admitting: Vascular Surgery

## 2016-05-03 VITALS — BP 129/77 | HR 59 | Temp 98.1°F | Resp 16 | Ht 72.0 in | Wt 171.0 lb

## 2016-05-03 DIAGNOSIS — I724 Aneurysm of artery of lower extremity: Secondary | ICD-10-CM | POA: Diagnosis not present

## 2016-05-03 DIAGNOSIS — I743 Embolism and thrombosis of arteries of the lower extremities: Secondary | ICD-10-CM | POA: Insufficient documentation

## 2016-05-03 NOTE — Progress Notes (Signed)
V[facility] and [facility] Specialist of [facility]  Patient name: Jesse [name] MRN: [id number] DOB: 10/30/1927 Sex: male  REASON FOR VISIT: Follow-up diffuse aneurysmal disease  HPI: Jesse [name] is a 82 y.o. male . Today for follow-up. Had the open aneurysm repair by Dr. [name] in 1995. Underwent repair of a large right femoral aneurysm by myself and 2013. He continues to do quite well. He did suffer patella fracture since my last visit with him recovered from this without difficulty. He is quite active at his age of 41. Reports that he occasionally has a upset stomach but otherwise no new major medical difficulties. No new cardiac difficulties.  Past Medical History:  Diagnosis Date  . AAA (abdominal aortic aneurysm) ([place])   . Arthritis   . Bronchitis    hx of appro 40 years ago  . Cancer ([place]) 2010   prostate ( 40 Txs. of radiation treatments)  . Coronary artery disease   . Environmental allergies   . GERD (gastroesophageal reflux disease)   . HOH (hard of hearing)    wears bilateral hearing aids  . Hyperlipidemia   . Hypertension   . Kidney stone 1968  . Myocardial infarction 1992  . Pneumonia    hx of  . RBBB     Family History  Problem Relation Age of Onset  . Cancer Mother   . Cancer Father   . Varicose Veins Father   . Cancer Sister   . Diabetes Sister   . Heart attack Sister     SOCIAL HISTORY: Social History  Substance Use Topics  . Smoking status: Former Smoker    Years: 20.00    Types: Cigarettes    Quit date: 07/25/1968  . Smokeless tobacco: Never Used  . Alcohol use No    Allergies  Allergen Reactions  . Lactose Intolerance (Gi) Other (See Comments)    Gas, bloating, stomach indigestion   . Adhesive [Tape] Itching and Rash    Current Outpatient Prescriptions  Medication Sig Dispense Refill  . acetaminophen (TYLENOL) 325 MG tablet Take 650 mg by mouth every 6 (six) hours as needed for mild pain.    Marland Kitchen Alum  Hydroxide-Mag Carbonate (GAVISCON PO) Take 1 tablet by mouth daily as needed (upset stomach).     Marland Kitchen aspirin EC 325 MG tablet Take 1 tablet (325 mg total) by mouth daily. 30 tablet 0  . carboxymethylcellulose (REFRESH PLUS) 0.5 % SOLN Apply 1 drop to eye 3 (three) times daily as needed.    . carvedilol (COREG) 12.5 MG tablet Take 12.5 mg by mouth 2 (two) times daily with a meal.    . dicyclomine (BENTYL) 20 MG tablet Take 1 tablet (20 mg total) by mouth 2 (two) times daily. 20 tablet 0  . fenofibrate 160 MG tablet Take 1 tablet (160 mg total) by mouth daily. 90 tablet 2  . hydrochlorothiazide (HYDRODIURIL) 12.5 MG tablet Take 1 tablet (12.5 mg total) by mouth daily. 90 tablet 3  . Lactase (DAIRY DIGESTIVE SUPPLEMENT PO) Take 1 tablet by mouth daily as needed (when eating foods containing lactose).     Marland Kitchen loratadine (CLARITIN) 10 MG tablet Take 10 mg by mouth daily.     Marland Kitchen losartan (COZAAR) 50 MG tablet Take 1 tablet (50 mg total) by mouth daily. 90 tablet 2  . omeprazole (PRILOSEC) 20 MG capsule Take 1 capsule (20 mg total) by mouth daily. 30 capsule 0  . oxyCODONE-acetaminophen (PERCOCET/ROXICET) 5-325 MG tablet Take 1 tablet by  mouth every 6 (six) hours as needed for severe pain. 60 tablet 0  . Polyvinyl Alcohol-Povidone (REFRESH OP) Apply 1 drop to eye 3 (three) times daily as needed. For dry eyes    . Probiotic Product (PROBIOTIC PO) Take 1 capsule by mouth daily.    . simvastatin (ZOCOR) 40 MG tablet Take 1 tablet (40 mg total) by mouth daily at 6 PM. 90 tablet 2   No current facility-administered medications for this visit.     REVIEW OF SYSTEMS:  [X]  denotes positive finding, [ ]  denotes negative finding Cardiac  Comments:  Chest pain or chest pressure:     Shortness of breath upon exertion:    Short of breath when lying flat:    Irregular heart rhythm:        Vascular    Pain in calf, thigh, or hip brought on by ambulation:    Pain in feet at night that wakes you up from your  sleep:     Blood clot in your veins:    Leg swelling:         Pulmonary    Oxygen at home:    Productive cough:     Wheezing:         Neurologic    Sudden weakness in arms or legs:     Sudden numbness in arms or legs:     Sudden onset of difficulty speaking or slurred speech:    Temporary loss of vision in one eye:     Problems with dizziness:         Gastrointestinal    Blood in stool:     Vomited blood:         Genitourinary    Burning when urinating:     Blood in urine:        Psychiatric    Major depression:         Hematologic    Bleeding problems:    Problems with blood clotting too easily:        Skin    Rashes or ulcers:        Constitutional    Fever or chills:      PHYSICAL EXAM: Vitals:   05/03/16 0939  BP: 129/77  Pulse: (!) 59  Resp: 16  Temp: 98.1 F (36.7 C)  TempSrc: Oral  SpO2: 99%  Weight: 171 lb (77.6 kg)  Height: 6' (1.829 m)    GENERAL: The patient is a well-nourished male, in no acute distress. The vital signs are documented above. VASCULAR: 2+ femoral pulses bilaterally. Prominent popliteal pulses bilaterally. PULMONARY: There is good air exchange bilaterally without wheezing or rales. ABDOMEN: Soft and non-tender diffuse abdominal hernia which is easily reducible MUSCULOSKELETAL: There are no major deformities or cyanosis. NEUROLOGIC: No focal weakness or paresthesias are detected. SKIN: There are no ulcers or rashes noted. PSYCHIATRIC: The patient has a normal affect.  DATA:  He underwent duplex of his aorta and iliac system today and also a duplex of his bilateral lower extremity arteries. This shows a no significant change. In the past she is felt have the popliteal artery is on the left proximally 3.5 cm. Maximal diameter visualized today is 2 cm. No change on the right at 1.7 L.  MEDICAL ISSUES: Diffuse arteriomegaly with diffuse aneurysmal disease. Again discussed that these could put him at risk for thromboses. He is  comfortable with continued observation uncertain feel that the elective repair would pose a greater risk. Again reviewed symptoms of  thromboses and rupture and he will present alert immediately should this occur. We will see him again in one year with repeat ultrasound    Rosetta Posner, MD [facility] Tel [phone] Pager [phone]

## 2016-05-09 ENCOUNTER — Other Ambulatory Visit: Payer: Self-pay | Admitting: Emergency Medicine

## 2016-05-09 ENCOUNTER — Other Ambulatory Visit: Payer: Self-pay | Admitting: *Deleted

## 2016-05-09 DIAGNOSIS — I713 Abdominal aortic aneurysm, ruptured, unspecified: Secondary | ICD-10-CM

## 2016-05-10 ENCOUNTER — Encounter: Payer: Self-pay | Admitting: *Deleted

## 2016-05-12 ENCOUNTER — Other Ambulatory Visit: Payer: Medicare Other

## 2016-05-12 ENCOUNTER — Ambulatory Visit (INDEPENDENT_AMBULATORY_CARE_PROVIDER_SITE_OTHER): Payer: Medicare Other | Admitting: Cardiothoracic Surgery

## 2016-05-12 VITALS — BP 125/79 | HR 71 | Resp 20 | Ht 72.0 in | Wt 171.0 lb

## 2016-05-12 DIAGNOSIS — I7123 Aneurysm of the descending thoracic aorta, without rupture: Secondary | ICD-10-CM

## 2016-05-12 DIAGNOSIS — I712 Thoracic aortic aneurysm, without rupture: Secondary | ICD-10-CM

## 2016-05-12 NOTE — Progress Notes (Signed)
[address]       [place] [address]             [phone]                    Duwane W Behrend [place] Medical Record [id number] Date of Birth: [birth year]  Referring: Early, [name], MD Primary Care: Tawanna Solo, MD  Chief Complaint:    Chief Complaint  Patient presents with  . Thoracic Aortic Aneurysm    2m month f/u Descending thoracic aortic aneurysm    History of Present Illness:    Jesse [name] 82 y.o. male is seen in the office for incidental finding of descending thoracic aneurysm. He has been followed by Dr Early  for abdominal aneurysm disease known since 1992 when he had CABG.  He was last seen in Oct 2017 2016. He has had open abdominal aneurysm repair and bilateral femoral  aneurysm repair.       The patient is followed by Dr Burt Knack  for coronary artery disease status post remote CABG in 1992. He has a chronic right bundle branch block. The patient was evaluated in the emergency room last week for shortness of breath. He also had some lower abdominal discomfort. . The patient's d-dimer was elevated and a CT angiogram of the chest was done. 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   Current Activity/ Functional Status:  Patient is independent with mobility/ambulation, transfers, ADL's, IADL's.   Zubrod Score: At the time of surgery this patient's most appropriate activity status/level should be described as: []     0    Normal activity, no symptoms [x]     1    Restricted in physical strenuous activity but ambulatory, able to do out light work []     2    Ambulatory and capable of self care, unable to do work activities, up and about               >50 % of waking hours                              []     3    Only limited self care, in bed greater than 50% of waking hours []     4    Completely disabled, no self care, confined to bed or chair []     5    Moribund   Past  Medical History:  Diagnosis Date  . AAA (abdominal aortic aneurysm) ([place])   . Arthritis   . Bronchitis    hx of appro 40 years ago  . Cancer ([place]) 2010   prostate ( 40 Txs. of radiation treatments)  . Coronary artery disease   . Environmental allergies   . GERD (gastroesophageal reflux disease)   . HOH (hard of hearing)    wears bilateral hearing aids  . Hyperlipidemia   . Hypertension   . Kidney stone 1968  . Myocardial infarction 1992  . Pneumonia    hx of  . RBBB     Past Surgical History:  Procedure Laterality Date  . ABDOMINAL AORTIC ANEURYSM REPAIR  08-13-1993   Dr Cameron Sprang  . CARDIAC CATHETERIZATION    . COLONOSCOPY W/ POLYPECTOMY    . CORONARY ARTERY BYPASS GRAFT  03-01-1991   Dr. Servando Snare  . EYE SURGERY  Detached retina (Left eye); bilateral cataract removal  . FALSE ANEURYSM REPAIR  05/07/2012   Procedure: REPAIR FALSE ANEURYSM;  Surgeon: Rosetta Posner, MD;  Location: [facility] OR;  Service: Vascular;  Laterality: Left;  Repair of Left Femoral Artery Aneurysm  . HERNIA REPAIR    . ORIF PATELLA Right 06/09/2015   Procedure: OPEN REDUCTION INTERNAL (ORIF) FIXATION PATELLA;  Surgeon: Marybelle Killings, MD;  Location: [facility];  Service: Orthopedics;  Laterality: Right;  . repair of bowel obstruction  1999   Dr. Dalbert Batman  . repair of ventral hernia  04-20-1994   P. Cameron Sprang MD  . resection femoral artery  03/19/2012   Dr. [name] Early    Family History  Problem Relation Age of Onset  . Cancer Mother   . Cancer Father   . Varicose Veins Father   . Cancer Sister   . Diabetes Sister   . Heart attack Sister     Social History   Social History  . Marital status: Widowed    Spouse name: N/A  . Number of children: N/A  . Years of education: N/A   Occupational History  . Not on file.   Social History Main Topics  . Smoking status: Former Smoker    Years: 20.00    Types: Cigarettes    Quit date: 07/25/1968  . Smokeless tobacco: Never Used  . Alcohol use No    . Drug use: No  . Sexual activity: Not on file   Other Topics Concern  . Not on file   Social History Narrative  . No narrative on file    History  Smoking Status  . Former Smoker  . Years: 20.00  . Types: Cigarettes  . Quit date: 07/25/1968  Smokeless Tobacco  . Never Used    History  Alcohol Use No     Allergies  Allergen Reactions  . Lactose Intolerance (Gi) Other (See Comments)    Gas, bloating, stomach indigestion   . Adhesive [Tape] Itching and Rash    Current Outpatient Prescriptions  Medication Sig Dispense Refill  . acetaminophen (TYLENOL) 325 MG tablet Take 650 mg by mouth every 6 (six) hours as needed for mild pain.    Marland Kitchen Alum Hydroxide-Mag Carbonate (GAVISCON PO) Take 1 tablet by mouth daily as needed (upset stomach).     Marland Kitchen aspirin EC 325 MG tablet Take 1 tablet (325 mg total) by mouth daily. 30 tablet 0  . carboxymethylcellulose (REFRESH PLUS) 0.5 % SOLN Apply 1 drop to eye 3 (three) times daily as needed.    . carvedilol (COREG) 12.5 MG tablet Take 12.5 mg by mouth 2 (two) times daily with a meal.    . dicyclomine (BENTYL) 20 MG tablet Take 1 tablet (20 mg total) by mouth 2 (two) times daily. 20 tablet 0  . fenofibrate 160 MG tablet Take 1 tablet (160 mg total) by mouth daily. 90 tablet 2  . hydrochlorothiazide (HYDRODIURIL) 12.5 MG tablet Take 1 tablet (12.5 mg total) by mouth daily. 90 tablet 3  . Lactase (DAIRY DIGESTIVE SUPPLEMENT PO) Take 1 tablet by mouth daily as needed (when eating foods containing lactose).     Marland Kitchen loratadine (CLARITIN) 10 MG tablet Take 10 mg by mouth daily.     Marland Kitchen losartan (COZAAR) 50 MG tablet Take 1 tablet (50 mg total) by mouth daily. 90 tablet 2  . omeprazole (PRILOSEC) 20 MG capsule Take 1 capsule (20 mg total) by mouth daily. 30 capsule 0  . oxyCODONE-acetaminophen (  PERCOCET/ROXICET) 5-325 MG tablet Take 1 tablet by mouth every 6 (six) hours as needed for severe pain. 60 tablet 0  . Polyvinyl Alcohol-Povidone (REFRESH OP) Apply  1 drop to eye 3 (three) times daily as needed. For dry eyes    . Probiotic Product (PROBIOTIC PO) Take 1 capsule by mouth daily.    . simvastatin (ZOCOR) 40 MG tablet Take 1 tablet (40 mg total) by mouth daily at 6 PM. 90 tablet 2   No current facility-administered medications for this visit.       Review of Systems:     Cardiac Review of Systems: Y or N  Chest Pain [ n  ]  Resting SOB [  n ] Exertional SOB  [  n]  Orthopnea [ n ]   Pedal Edema [n   ]    Palpitations [n  ] Syncope  Florencio.Farrier  ]   Presyncope [  n ]  General Review of Systems: [Y] = yes [  ]=no Constitional: recent weight change [ n ];  Wt loss over the last 3 months [   ] anorexia [  ]; fatigue [  ]; nausea [  ]; night sweats [  ]; fever [  ]; or chills [  ];          Dental: poor dentition[  ]; Last Dentist visit:   Eye : blurred vision [  ]; diplopia [   ]; vision changes [  ];  Amaurosis fugax[  ]; Resp: cough [n  ];  wheezing[  ];  hemoptysis[  ]; shortness of breath[  ]; paroxysmal nocturnal dyspnea[n]; dyspnea on exertion[  ]; or orthopnea[ n ];  GI:  gallstones[  ], vomiting[  ];  dysphagia[  ]; melena[  ];  hematochezia [  ]; heartburn[  ];   Hx of  Colonoscopy[  ]; GU: kidney stones [  ]; hematuria[  ];   dysuria [  ];  nocturia[  ];  history of     obstruction [  ]; urinary frequency [  ]             Skin: rash, swelling[  ];, hair loss[  ];  peripheral edema[  ];  or itching[  ]; Musculosketetal: myalgias[  ];  joint swelling[  ];  joint erythema[  ];  joint pain[  ];  back pain[  ];  Heme/Lymph: bruising[  ];  bleeding[  ];  anemia[  ];  Neuro: TIA[ n ];  headaches[  ];  stroke[n  ];  vertigo[  ];  seizures[  n];   paresthesias[  ];  difficulty walking[ n ];  Psych:depression[  ]; anxiety[  ];  Endocrine: diabetes[  ];  thyroid dysfunction[  ];  Immunizations: Flu up to date Blue.Reese ]; Pneumococcal up to date [  ];  Other:  Physical Exam: BP 125/79   Pulse 71   Resp 20   Ht 6' (1.829 m)   Wt 171 lb (77.6 kg)   SpO2  98% Comment: RA  BMI 23.19 kg/m   PHYSICAL EXAMINATION: General appearance: alert, cooperative, appears stated age and no distress Head: Normocephalic, without obvious abnormality, atraumatic Neck: no adenopathy, no carotid bruit, no JVD, supple, symmetrical, trachea midline and thyroid not enlarged, symmetric, no tenderness/mass/nodules Lymph nodes: Cervical, supraclavicular, and axillary nodes normal. Resp: clear to auscultation bilaterally Back: symmetric, no curvature. ROM normal. No CVA tenderness. Cardio: regular rate and rhythm, S1, S2 normal, no murmur, click, rub or gallop  GI: abdominal ventral incisional hernia is present  Extremities: extremities normal, atraumatic, no cyanosis or edema and Homans sign is negative, no sign of DVT Neurologic: Grossly normal bilateral groin incisions well healed  bilateral popliteal artery pulse easily felt  dp and pt pulses present   Diagnostic Studies & Laboratory data:     Recent Radiology Findings:   Dg Chest 2 View  01/01/2016  CLINICAL DATA:  82 year old male with a history of EKG changes. Shortness of breath EXAM: CHEST  2 VIEW COMPARISON:  06/09/2015 FINDINGS: Cardiomediastinal silhouette unchanged with cardiomegaly. Surgical changes of median sternotomy and CABG. No evidence of central vascular congestion. Coarsened interstitial markings, similar to the prior. Architectural distortion at the left base is again noted. Stigmata of emphysema, with increased retrosternal airspace, flattened hemidiaphragms, increased AP diameter, and hyperinflation on the AP view. No pleural effusion or pneumothorax.  No confluent airspace disease. IMPRESSION: Chronic lung changes and emphysema with no evidence of acute cardiopulmonary disease. Surgical changes of median sternotomy and CABG. Signed, Dulcy Fanny. Earleen Newport, DO [facility] Electronically Signed   By: Corrie Mckusick D.O.   On: 01/01/2016 12:30   Ct  Angio Chest Pe W/cm &/or Wo Cm  01/01/2016  CLINICAL DATA:  82 year old presenting with intermittent shortness of breath for the past several days which acutely worsened this morning. Intermittent cough. Elevated D-dimer. Prior CABG in 1992 and prior abdominal aortic aneurysm repair in 1995. EXAM: CT ANGIOGRAPHY CHEST WITH CONTRAST TECHNIQUE: Multidetector CT imaging of the chest was performed using the standard protocol during bolus administration of intravenous contrast. Multiplanar CT image reconstructions and MIPs were obtained to evaluate the vascular anatomy. CONTRAST:  100 ml Isovue 370 IV. COMPARISON:  No prior CT.  Multiple prior chest x-rays. FINDINGS: Technical quality:  Very good. Pulmonary embolism:  Absent. Cardiovascular: Prior sternotomy for CABG. Heart moderately to markedly enlarged. Mild left ventricular enlargement and possible mild left ventricular hypertrophy. Right ventricular enlargement and right ventricular hypertrophy. No pericardial effusion. Moderate atherosclerosis involving the thoracic and upper abdominal aorta with evidence of the descending thoracic aortic aneurysm with maximum diameter approximating 4.5 cm. Mediastinum/Lymph Nodes: No pathologically enlarged mediastinal, hilar or axillary lymph nodes. No mediastinal masses. Normal-appearing esophagus. Visualized thyroid gland unremarkable. Lungs/Pleura: Streaky and patchy opacities in the left lower lobe. Lungs otherwise clear. No evidence of interstitial pulmonary edema. No pulmonary parenchymal nodules or masses. No pleural effusions. Central airways patent with moderate to marked bronchial wall thickening. Upper abdomen: Numerous calcified gallstones without evidence of acute cholecystitis. No acute abnormalities. Musculoskeletal: Severe osseous demineralization. Exaggeration of the usual kyphosis. Mild degenerative disc disease and spondylosis involving the mid and lower thoracic spine. Review of the MIP images confirms the  above findings. IMPRESSION: 1. No evidence of pulmonary embolism. 2. Moderate to marked cardiomegaly with biventricular enlarged. 3. Approximate 4.5 cm descending thoracic aortic aneurysm. Recommend annual imaging followup by CTA or MRA. This recommendation follows 2010 ACCF/AHA/AATS/ACR/ASA/SCA/SCAI/SIR/STS/SVM Guidelines for the Diagnosis and Management of Patients with Thoracic Aortic Disease. Circulation. 2010; 121: [id number]. 4. Left lower lobe atelectasis and/or bronchopneumonia. Moderate to severe changes of bronchitis and/or asthma. 5. Cholelithiasis without evidence of acute cholecystitis. Electronically Signed   By: Evangeline Dakin M.D.   On: 01/01/2016 15:10   Dg Abd 2 Views  01/01/2016  CLINICAL DATA:  Abdominal pain for 1 week.  Nausea. EXAM: ABDOMEN - 2 VIEW COMPARISON:  None. FINDINGS: Atelectasis seen in the left lung base. Lung bases otherwise normal. No free  air, portal venous gas, or pneumatosis. Surgical clips are seen within the mid central abdomen. Surgical clips are seen in the pelvis is well. There is no evidence of bowel obstruction on this study. Both kidneys are largely obscured by bowel contents but no renal stones are identified. IMPRESSION: No acute abnormalities. Electronically Signed   By: Dorise Bullion III M.D   On: 01/01/2016 12:57     I have independently reviewed the above radiologic studies.  Recent Lab Findings: Lab Results  Component Value Date   WBC 6.7 01/01/2016   HGB 12.9 (L) 01/01/2016   HCT 40.6 01/01/2016   PLT 259 01/01/2016   GLUCOSE 106 (H) 01/01/2016   CHOL 123 05/20/2014   TRIG 64.0 05/20/2014   HDL 28.90 (L) 05/20/2014   LDLCALC 81 05/20/2014   ALT 15 (L) 01/01/2016   AST 16 01/01/2016   NA 136 01/01/2016   K 3.8 01/01/2016   CL 105 01/01/2016   CREATININE 0.98 01/01/2016   BUN 16 01/01/2016   CO2 25 01/01/2016   INR 1.23 06/09/2015      Assessment / Plan:    Descending thoracic aortic aneurysm: 4.5 cm by CT scan. The patient  has extensive aneurysmal disease. He has undergone AAA repair   Coronary artery disease, native vessel: The patient has no symptoms of angina. He is status post remote CABG x 25 years .    Essential hypertension: His blood pressure is controlled on  carvedilol, hydrochlorothiazide, and losartan.   Hyperlipidemia: The patient is treated with simvastatin.  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   Will plan to get CTA of chest abdomen pelvis to check in next week or so.  Grace Isaac MD      [address] [place] [address] Office [phone]   Beeper [phone]  05/12/2016 3:09 PM

## 2016-05-13 ENCOUNTER — Other Ambulatory Visit: Payer: Self-pay | Admitting: *Deleted

## 2016-05-13 DIAGNOSIS — I723 Aneurysm of iliac artery: Secondary | ICD-10-CM

## 2016-05-18 ENCOUNTER — Ambulatory Visit
Admission: RE | Admit: 2016-05-18 | Discharge: 2016-05-18 | Disposition: A | Discharge status: Home / Self Care | Payer: Medicare Other | Source: Ambulatory Visit | Attending: Cardiothoracic Surgery | Admitting: Cardiothoracic Surgery

## 2016-05-18 DIAGNOSIS — I724 Aneurysm of artery of lower extremity: Secondary | ICD-10-CM | POA: Diagnosis not present

## 2016-05-18 DIAGNOSIS — I723 Aneurysm of iliac artery: Secondary | ICD-10-CM | POA: Diagnosis not present

## 2016-05-18 DIAGNOSIS — I712 Thoracic aortic aneurysm, without rupture: Secondary | ICD-10-CM | POA: Diagnosis not present

## 2016-05-18 MED ORDER — IOPAMIDOL (ISOVUE-370) INJECTION 76%
75.0000 mL | Freq: Once | INTRAVENOUS | Status: AC | PRN
  Administered 2016-05-18: 75 mL via INTRAVENOUS

## 2016-05-19 ENCOUNTER — Telehealth: Payer: Self-pay | Admitting: Cardiothoracic Surgery

## 2016-05-19 NOTE — Telephone Encounter (Signed)
      [address]       [place] [address]             [phone]    CT of chest abdomen and pelvis reviewed and results called to patient, thoracicaorta will continue to follow as outlined in last note. Hypogastric vessels slightly more enlarged, will send information to Dr early for follow up. Patient aware

## 2016-06-13 DIAGNOSIS — H43811 Vitreous degeneration, right eye: Secondary | ICD-10-CM | POA: Diagnosis not present

## 2016-06-13 DIAGNOSIS — H35431 Paving stone degeneration of retina, right eye: Secondary | ICD-10-CM | POA: Diagnosis not present

## 2016-06-13 DIAGNOSIS — H35373 Puckering of macula, bilateral: Secondary | ICD-10-CM | POA: Diagnosis not present

## 2016-06-13 NOTE — Addendum Note (Signed)
Addended by: Lianne Cure A on: 06/13/2016 08:29 AM   Modules accepted: Orders

## 2016-06-22 ENCOUNTER — Other Ambulatory Visit: Payer: Self-pay

## 2016-06-22 DIAGNOSIS — H35372 Puckering of macula, left eye: Secondary | ICD-10-CM | POA: Diagnosis not present

## 2016-06-22 DIAGNOSIS — H01024 Squamous blepharitis left upper eyelid: Secondary | ICD-10-CM | POA: Diagnosis not present

## 2016-06-22 DIAGNOSIS — H01021 Squamous blepharitis right upper eyelid: Secondary | ICD-10-CM | POA: Diagnosis not present

## 2016-06-22 MED ORDER — CARVEDILOL 12.5 MG PO TABS: 12.5000 mg | ORAL_TABLET | Freq: Two times a day (BID) | ORAL | 2 refills | Status: DC

## 2016-06-22 MED ORDER — FENOFIBRATE 160 MG PO TABS: 160.0000 mg | ORAL_TABLET | Freq: Every day | ORAL | 2 refills | Status: DC

## 2016-09-16 IMAGING — CR DG KNEE COMPLETE 4+V*R*
4 series · 4 of 4 positions shown · non-contrast
Comparison: None.

CLINICAL DATA: Severe knee pain swelling bruising for 2 hours after
tripping and falling today

EXAM:
RIGHT KNEE - COMPLETE 4+ VIEW

[t knee ap right]
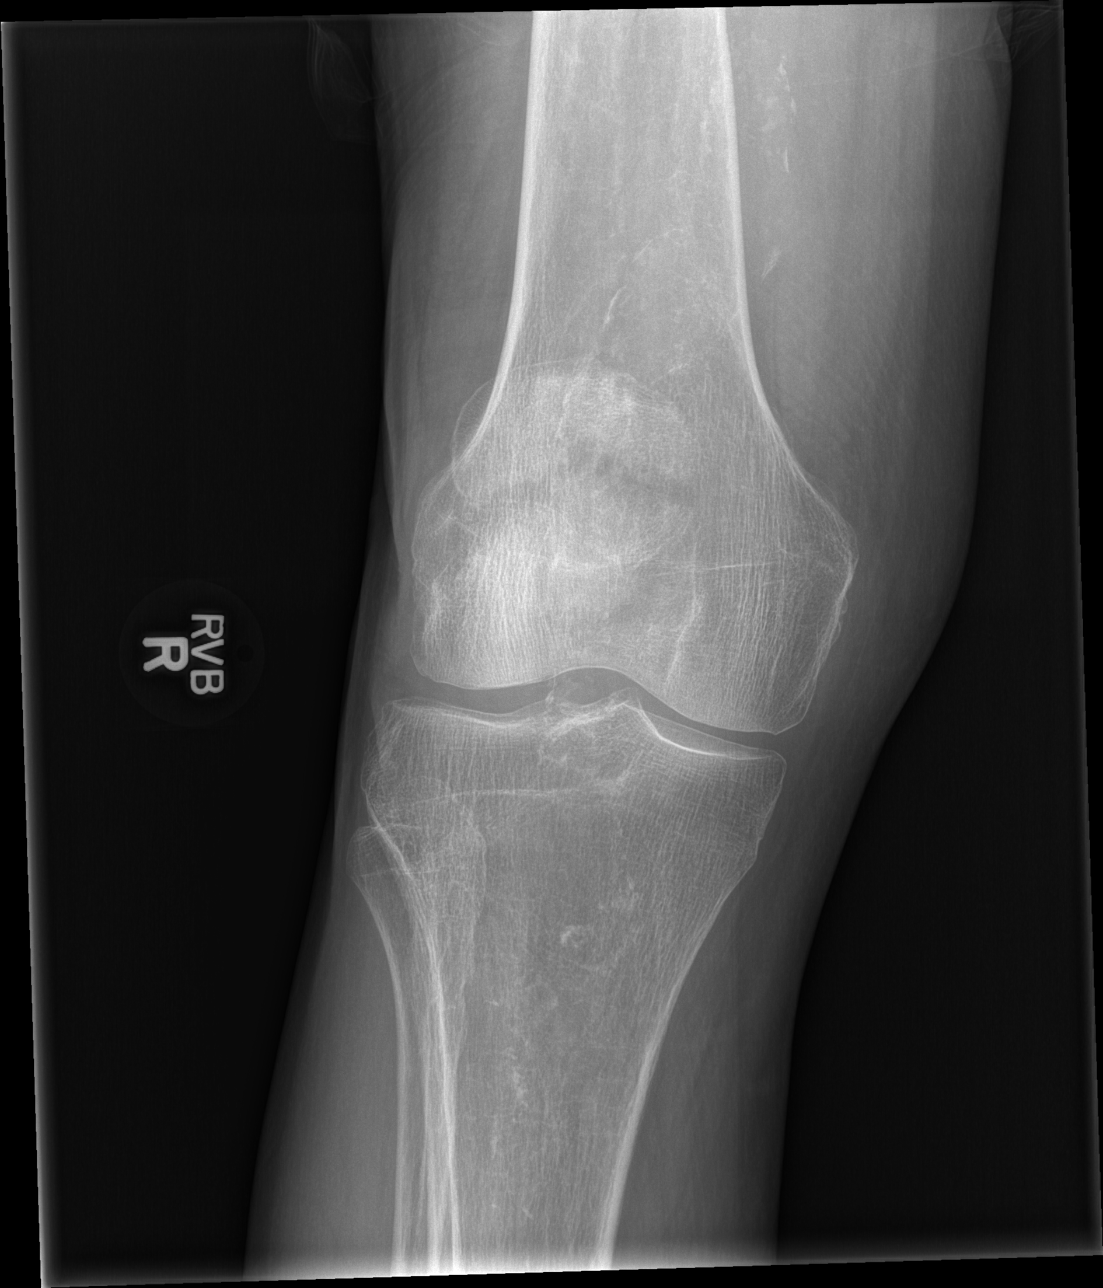

[t knee obl right (1 of 2)]
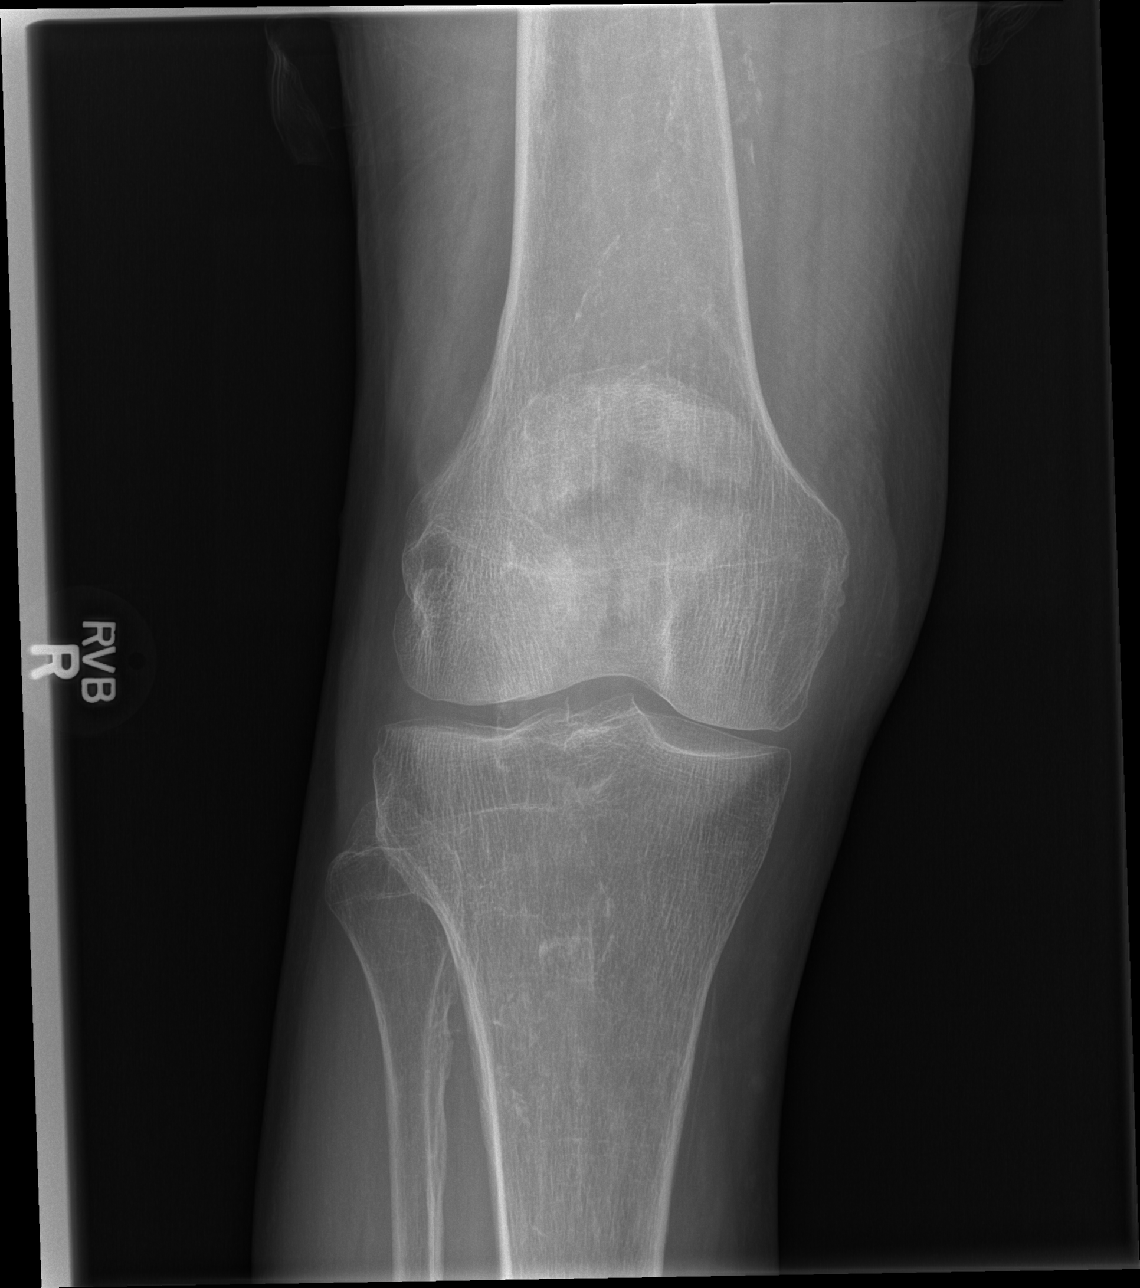

[t knee obl right (2 of 2)]
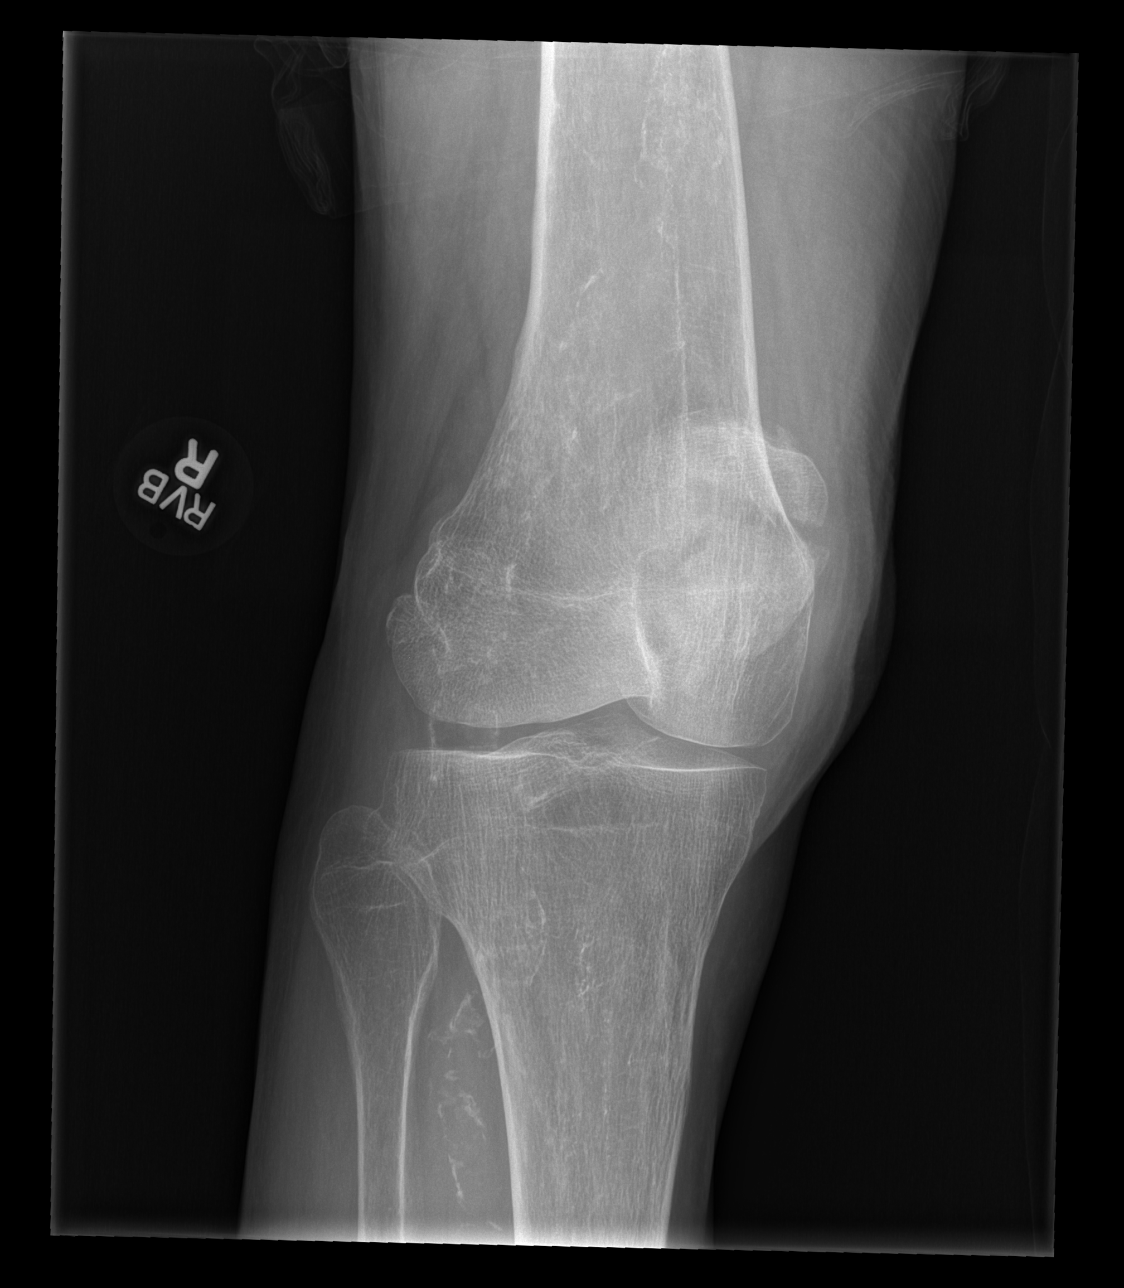

[x knee lat right]
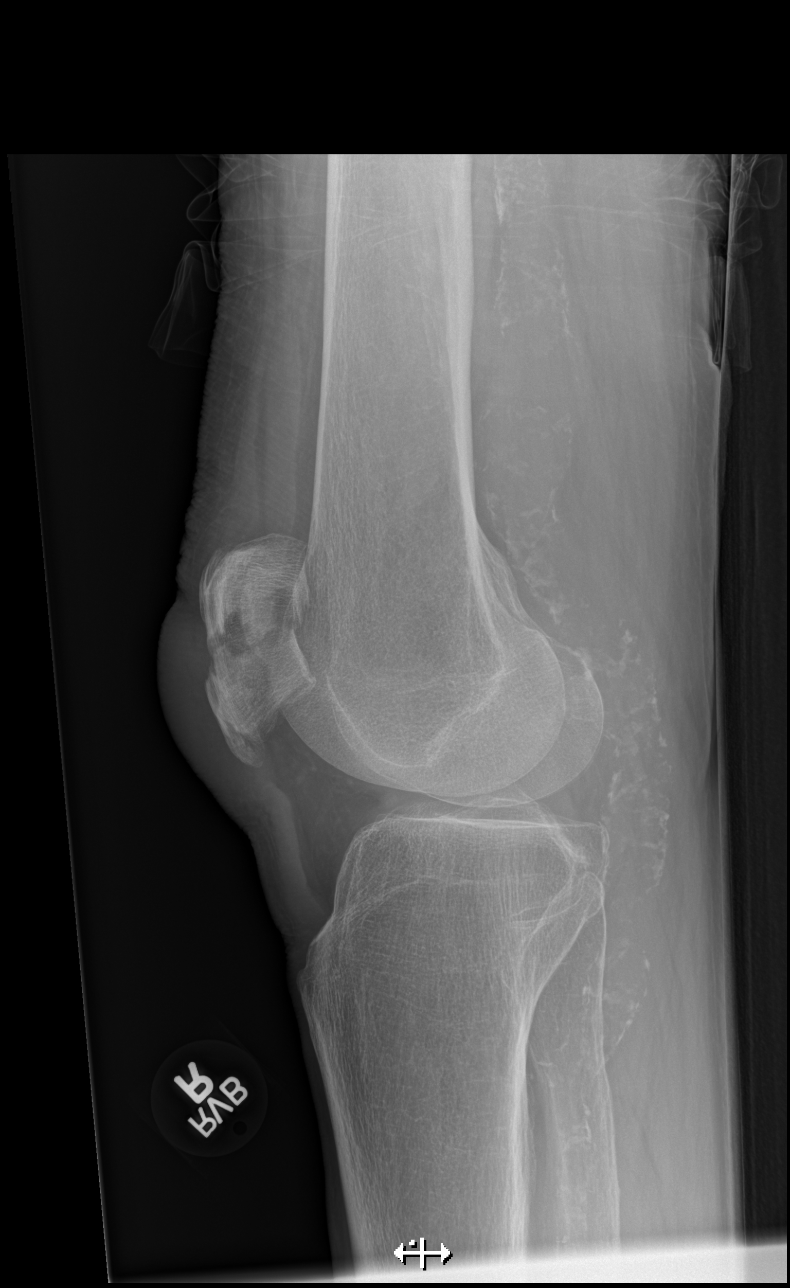

[4 of 4 positions shown; findings below may reference images not displayed]

FINDINGS: Calcification demonstrates known popliteal artery aneurysm. Diffuse
osteopenia. Comminuted fracture of the patella with prepatellar soft
tissue swelling. Small joint effusion.
IMPRESSION: Fracture of the patella.

Known popliteal artery aneurysm.

## 2016-09-30 ENCOUNTER — Other Ambulatory Visit: Payer: Self-pay | Admitting: Cardiothoracic Surgery

## 2016-09-30 DIAGNOSIS — I712 Thoracic aortic aneurysm, without rupture, unspecified: Secondary | ICD-10-CM

## 2016-10-18 DIAGNOSIS — Z8546 Personal history of malignant neoplasm of prostate: Secondary | ICD-10-CM | POA: Diagnosis not present

## 2016-10-21 ENCOUNTER — Other Ambulatory Visit: Payer: Self-pay | Admitting: Cardiothoracic Surgery

## 2016-10-27 ENCOUNTER — Ambulatory Visit
Admission: RE | Admit: 2016-10-27 | Discharge: 2016-10-27 | Disposition: A | Discharge status: Home / Self Care | Payer: Medicare Other | Source: Ambulatory Visit | Attending: Cardiothoracic Surgery | Admitting: Cardiothoracic Surgery

## 2016-10-27 ENCOUNTER — Encounter: Payer: Self-pay | Admitting: Cardiothoracic Surgery

## 2016-10-27 ENCOUNTER — Ambulatory Visit (INDEPENDENT_AMBULATORY_CARE_PROVIDER_SITE_OTHER): Payer: Medicare Other | Admitting: Cardiothoracic Surgery

## 2016-10-27 VITALS — BP 111/68 | HR 70 | Resp 16 | Ht 72.0 in | Wt 182.0 lb

## 2016-10-27 DIAGNOSIS — I712 Thoracic aortic aneurysm, without rupture, unspecified: Secondary | ICD-10-CM

## 2016-10-27 DIAGNOSIS — I7123 Aneurysm of the descending thoracic aorta, without rupture: Secondary | ICD-10-CM

## 2016-10-27 MED ORDER — IOPAMIDOL (ISOVUE-370) INJECTION 76%
75.0000 mL | Freq: Once | INTRAVENOUS | Status: AC | PRN
  Administered 2016-10-27: 75 mL via INTRAVENOUS

## 2016-10-27 NOTE — Progress Notes (Signed)
[address]       [place] [address]             [phone]                    [name] [place] Medical Record #[id number] Date of Birth: 20-Oct-1927  Referring: Early, [name], MD Primary Care: Tawanna Solo, MD  Chief Complaint:    Chief Complaint  Patient presents with  . Follow-up    6 month with CTA CHEST for surveillance of TAA    History of Present Illness:    Jesse [name] 83 y.o. male is seen in the office for incidental finding of descending thoracic aneurysm. He has been followed by Dr Early  for abdominal aneurysm disease known since 1992 when he had CABG.  . He has had open abdominal aneurysm repair and bilateral femoral  aneurysm repair.       The patient is followed by Dr Burt Knack  for coronary artery disease. He has a chronic right bundle branch block. The patient was evaluated in the emergency room  6 months ago  for shortness of breath. He also had some lower abdominal discomfort. . The patient's d-dimer was elevated and a CT angiogram of the chest was done. 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    Since last seen the patient has no new complaints.   Current Activity/ Functional Status:  Patient is independent with mobility/ambulation, transfers, ADL's, IADL's.   Zubrod Score: At the time of surgery this patient's most appropriate activity status/level should be described as: []     0    Normal activity, no symptoms [x]     1    Restricted in physical strenuous activity but ambulatory, able to do out light work []     2    Ambulatory and capable of self care, unable to do work activities, up and about               >50 % of waking hours                              []     3    Only limited self care, in bed greater than 50% of waking hours []     4    Completely disabled, no self care, confined to bed or chair []     5    Moribund   Past Medical History:    Diagnosis Date  . AAA (abdominal aortic aneurysm) ([place])   . Arthritis   . Bronchitis    hx of appro 40 years ago  . Cancer ([place]) 2010   prostate ( 40 Txs. of radiation treatments)  . Coronary artery disease   . Environmental allergies   . GERD (gastroesophageal reflux disease)   . HOH (hard of hearing)    wears bilateral hearing aids  . Hyperlipidemia   . Hypertension   . Kidney stone 1968  . Myocardial infarction 1992  . Pneumonia    hx of  . RBBB     Past Surgical History:  Procedure Laterality Date  . ABDOMINAL AORTIC ANEURYSM REPAIR  08-13-1993   Dr Cameron Sprang  . CARDIAC CATHETERIZATION    . COLONOSCOPY W/ POLYPECTOMY    . CORONARY ARTERY BYPASS GRAFT  03-01-1991   Dr. Servando Snare  . EYE  SURGERY     Detached retina (Left eye); bilateral cataract removal  . FALSE ANEURYSM REPAIR  05/07/2012   Procedure: REPAIR FALSE ANEURYSM;  Surgeon: Rosetta Posner, MD;  Location: [facility] OR;  Service: Vascular;  Laterality: Left;  Repair of Left Femoral Artery Aneurysm  . HERNIA REPAIR    . ORIF PATELLA Right 06/09/2015   Procedure: OPEN REDUCTION INTERNAL (ORIF) FIXATION PATELLA;  Surgeon: Marybelle Killings, MD;  Location: [facility];  Service: Orthopedics;  Laterality: Right;  . repair of bowel obstruction  1999   Dr. Dalbert Batman  . repair of ventral hernia  04-20-1994   P. Cameron Sprang MD  . resection femoral artery  03/19/2012   Dr. [name] Early    Family History  Problem Relation Age of Onset  . Cancer Mother   . Cancer Father   . Varicose Veins Father   . Cancer Sister   . Diabetes Sister   . Heart attack Sister     Social History   Social History  . Marital status: Widowed    Spouse name: N/A  . Number of children: N/A  . Years of education: N/A   Occupational History  . Not on file.   Social History Main Topics  . Smoking status: Former Smoker    Years: 20.00    Types: Cigarettes    Quit date: 07/25/1968  . Smokeless tobacco: Never Used  . Alcohol use No  . Drug use: No   . Sexual activity: Not on file   Other Topics Concern  . Not on file   Social History Narrative  . No narrative on file    History  Smoking Status  . Former Smoker  . Years: 20.00  . Types: Cigarettes  . Quit date: 07/25/1968  Smokeless Tobacco  . Never Used    History  Alcohol Use No     Allergies  Allergen Reactions  . Lactose Intolerance (Gi) Other (See Comments)    Gas, bloating, stomach indigestion   . Adhesive [Tape] Itching and Rash    Current Outpatient Prescriptions  Medication Sig Dispense Refill  . acetaminophen (TYLENOL) 325 MG tablet Take 650 mg by mouth every 6 (six) hours as needed for mild pain.    Marland Kitchen Alum Hydroxide-Mag Carbonate (GAVISCON PO) Take 1 tablet by mouth daily as needed (upset stomach).     Marland Kitchen aspirin EC 325 MG tablet Take 1 tablet (325 mg total) by mouth daily. 30 tablet 0  . carboxymethylcellulose (REFRESH PLUS) 0.5 % SOLN Apply 1 drop to eye 3 (three) times daily as needed.    . carvedilol (COREG) 12.5 MG tablet Take 1 tablet (12.5 mg total) by mouth 2 (two) times daily with a meal. 180 tablet 2  . dicyclomine (BENTYL) 20 MG tablet Take 1 tablet (20 mg total) by mouth 2 (two) times daily. 20 tablet 0  . fenofibrate 160 MG tablet Take 1 tablet (160 mg total) by mouth daily. 90 tablet 2  . hydrochlorothiazide (HYDRODIURIL) 12.5 MG tablet Take 1 tablet (12.5 mg total) by mouth daily. 90 tablet 3  . Lactase (DAIRY DIGESTIVE SUPPLEMENT PO) Take 1 tablet by mouth daily as needed (when eating foods containing lactose).     Marland Kitchen loratadine (CLARITIN) 10 MG tablet Take 10 mg by mouth daily.     Marland Kitchen losartan (COZAAR) 50 MG tablet Take 1 tablet (50 mg total) by mouth daily. 90 tablet 2  . omeprazole (PRILOSEC) 20 MG capsule Take 1 capsule (20 mg total) by  mouth daily. 30 capsule 0  . Polyvinyl Alcohol-Povidone (REFRESH OP) Apply 1 drop to eye 3 (three) times daily as needed. For dry eyes    . Probiotic Product (PROBIOTIC PO) Take 1 capsule by mouth daily.     . simvastatin (ZOCOR) 40 MG tablet Take 1 tablet (40 mg total) by mouth daily at 6 PM. 90 tablet 2   No current facility-administered medications for this visit.       Review of Systems:     Cardiac Review of Systems: Y or N  Chest Pain [ n  ]  Resting SOB [  n ] Exertional SOB  [  n]  Orthopnea [ n ]   Pedal Edema [n   ]    Palpitations [n  ] Syncope  Florencio.Farrier  ]   Presyncope [  n ]  General Review of Systems: [Y] = yes [  ]=no Constitional: recent weight change [ n ];  Wt loss over the last 3 months [   ] anorexia [  ]; fatigue [  ]; nausea [  ]; night sweats [  ]; fever [  ]; or chills [  ];          Dental: poor dentition[  ]; Last Dentist visit:   Eye : blurred vision [  ]; diplopia [   ]; vision changes [  ];  Amaurosis fugax[  ]; Resp: cough [n  ];  wheezing[  ];  hemoptysis[  ]; shortness of breath[  ]; paroxysmal nocturnal dyspnea[n]; dyspnea on exertion[  ]; or orthopnea[ n ];  GI:  gallstones[  ], vomiting[  ];  dysphagia[  ]; melena[  ];  hematochezia [  ]; heartburn[  ];   Hx of  Colonoscopy[  ]; GU: kidney stones [  ]; hematuria[  ];   dysuria [  ];  nocturia[  ];  history of     obstruction [  ]; urinary frequency [  ]             Skin: rash, swelling[  ];, hair loss[  ];  peripheral edema[  ];  or itching[  ]; Musculosketetal: myalgias[  ];  joint swelling[  ];  joint erythema[  ];  joint pain[  ];  back pain[  ];  Heme/Lymph: bruising[  ];  bleeding[  ];  anemia[  ];  Neuro: TIA[ n ];  headaches[  ];  stroke[n  ];  vertigo[  ];  seizures[  n];   paresthesias[  ];  difficulty walking[ n ];  Psych:depression[  ]; anxiety[  ];  Endocrine: diabetes[  ];  thyroid dysfunction[  ];  Immunizations: Flu up to date Blue.Reese ]; Pneumococcal up to date [  ];  Other:  Physical Exam: BP 111/68 (BP Location: Right Arm, Patient Position: Sitting, Cuff Size: Large)   Pulse 70   Resp 16   Ht 6' (1.829 m)   Wt 182 lb (82.6 kg)   SpO2 96% Comment: ON RA  BMI 24.68 kg/m   PHYSICAL  EXAMINATION: General appearance: alert, cooperative, appears stated age and no distress Head: Normocephalic, without obvious abnormality, atraumatic Neck: no adenopathy, no carotid bruit, no JVD, supple, symmetrical, trachea midline and thyroid not enlarged, symmetric, no tenderness/mass/nodules Lymph nodes: Cervical, supraclavicular, and axillary nodes normal. Resp: clear to auscultation bilaterally Back: symmetric, no curvature. ROM normal. No CVA tenderness. Cardio: regular rate and rhythm, S1, S2 normal, no murmur, click, rub or gallop GI: abdominal ventral incisional hernia  is present  Extremities: extremities normal, atraumatic, no cyanosis or edema and Homans sign is negative, no sign of DVT Neurologic: Grossly normal bilateral groin incisions well healed  bilateral popliteal artery pulse easily felt  dp and pt pulses present   Diagnostic Studies & Laboratory data:     Recent Radiology Findings:    Ct Angio Chest Aorta W/cm &/or Wo/cm  Result Date: 10/27/2016 CLINICAL DATA:  Thoracic aortic aneurysm without rupture. EXAM: CT ANGIOGRAPHY CHEST WITH CONTRAST TECHNIQUE: Multidetector CT imaging of the chest was performed using the standard protocol during bolus administration of intravenous contrast. Multiplanar CT image reconstructions and MIPs were obtained to evaluate the vascular anatomy. CONTRAST:  75 mL of Isovue 370 intravenously. COMPARISON:  CT scan of May 18, 2016. FINDINGS: Cardiovascular: Stable 4.2 cm ascending thoracic aortic aneurysm is noted. Stable 4.5 cm proximal descending thoracic aortic aneurysm is noted. Aortic root measures 3.9 cm. Atherosclerosis of thoracic aorta is noted. No dissection is noted. Great vessels are widely patent. Status post coronary artery bypass graft. 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 Mediastinum/Nodes: No enlarged mediastinal, hilar, or axillary lymph nodes. Thyroid gland, trachea, and esophagus demonstrate no  significant findings. Lungs/Pleura: Lungs are clear. No pleural effusion or pneumothorax. Upper Abdomen: Cholelithiasis is noted. No other significant abnormality is noted in the visualized portion of upper abdomen. Musculoskeletal: No chest wall abnormality. No acute or significant osseous findings. Review of the MIP images confirms the above findings. IMPRESSION: Stable 4.2 cm ascending thoracic aortic aneurysm. Stable 4.5 cm proximal descending thoracic aortic aneurysm. Recommend annual imaging followup by CTA or MRA. This recommendation follows 2010 ACCF/AHA/AATS/ACR/ASA/SCA/SCAI/SIR/STS/SVM Guidelines for the Diagnosis and Management of Patients with Thoracic Aortic Disease. Circulation. 2010; 121: L935-T017. Lithiasis. Electronically Signed   By: Marijo Conception, M.D.   On: 10/27/2016 13:52   Ct Angio Chest Pe W/cm &/or Wo Cm  01/01/2016  CLINICAL DATA:  83 year old presenting with intermittent shortness of breath for the past several days which acutely worsened this morning. Intermittent cough. Elevated D-dimer. Prior CABG in 1992 and prior abdominal aortic aneurysm repair in 1995. EXAM: CT ANGIOGRAPHY CHEST WITH CONTRAST TECHNIQUE: Multidetector CT imaging of the chest was performed using the standard protocol during bolus administration of intravenous contrast. Multiplanar CT image reconstructions and MIPs were obtained to evaluate the vascular anatomy. CONTRAST:  100 ml Isovue 370 IV. COMPARISON:  No prior CT.  Multiple prior chest x-rays. FINDINGS: Technical quality:  Very good. Pulmonary embolism:  Absent. Cardiovascular: Prior sternotomy for CABG. Heart moderately to markedly enlarged. Mild left ventricular enlargement and possible mild left ventricular hypertrophy. Right ventricular enlargement and right ventricular hypertrophy. No pericardial effusion. Moderate atherosclerosis involving the thoracic and upper abdominal aorta with evidence of the descending thoracic aortic aneurysm with maximum  diameter approximating 4.5 cm. Mediastinum/Lymph Nodes: No pathologically enlarged mediastinal, hilar or axillary lymph nodes. No mediastinal masses. Normal-appearing esophagus. Visualized thyroid gland unremarkable. Lungs/Pleura: Streaky and patchy opacities in the left lower lobe. Lungs otherwise clear. No evidence of interstitial pulmonary edema. No pulmonary parenchymal nodules or masses. No pleural effusions. Central airways patent with moderate to marked bronchial wall thickening. Upper abdomen: Numerous calcified gallstones without evidence of acute cholecystitis. No acute abnormalities. Musculoskeletal: Severe osseous demineralization. Exaggeration of the usual kyphosis. Mild degenerative disc disease and spondylosis involving the mid and lower thoracic spine. Review of the MIP images confirms the above findings. IMPRESSION: 1. No evidence of pulmonary embolism. 2. Moderate to marked cardiomegaly with biventricular enlarged. 3.  Approximate 4.5 cm descending thoracic aortic aneurysm. Recommend annual imaging followup by CTA or MRA. This recommendation follows 2010 ACCF/AHA/AATS/ACR/ASA/SCA/SCAI/SIR/STS/SVM Guidelines for the Diagnosis and Management of Patients with Thoracic Aortic Disease. Circulation. 2010; 121: C588-F027. 4. Left lower lobe atelectasis and/or bronchopneumonia. Moderate to severe changes of bronchitis and/or asthma. 5. Cholelithiasis without evidence of acute cholecystitis. Electronically Signed   By: Evangeline Dakin M.D.   On: 01/01/2016 15:10   Dg Abd 2 Views  01/01/2016  CLINICAL DATA:  Abdominal pain for 1 week.  Nausea. EXAM: ABDOMEN - 2 VIEW COMPARISON:  None. FINDINGS: Atelectasis seen in the left lung base. Lung bases otherwise normal. No free air, portal venous gas, or pneumatosis. Surgical clips are seen within the mid central abdomen. Surgical clips are seen in the pelvis is well. There is no evidence of bowel obstruction on this study. Both kidneys are largely obscured by  bowel contents but no renal stones are identified. IMPRESSION: No acute abnormalities. Electronically Signed   By: Dorise Bullion III M.D   On: 01/01/2016 12:57     I have independently reviewed the above radiologic studies.  Recent Lab Findings: Lab Results  Component Value Date   WBC 6.7 01/01/2016   HGB 12.9 (L) 01/01/2016   HCT 40.6 01/01/2016   PLT 259 01/01/2016   GLUCOSE 106 (H) 01/01/2016   CHOL 123 05/20/2014   TRIG 64.0 05/20/2014   HDL 28.90 (L) 05/20/2014   LDLCALC 81 05/20/2014   ALT 15 (L) 01/01/2016   AST 16 01/01/2016   NA 136 01/01/2016   K 3.8 01/01/2016   CL 105 01/01/2016   CREATININE 0.98 01/01/2016   BUN 16 01/01/2016   CO2 25 01/01/2016   INR 1.23 06/09/2015      Assessment / Plan:  1/ Stable 4.2 cm ascending thoracic aortic aneurysm.  2/ Stable 4.5 cm proximal descending thoracic aortic aneurysm. 3/ extensive aneurysmal disease. He has undergone AAA repair   4/Coronary artery disease, native vessel: The patient has no symptoms of angina. He is status post remote CABG x 25 years .  5/Essential hypertension: His blood pressure is controlled on  carvedilol, hydrochlorothiazide, and losartan. 6/Hyperlipidemia: The patient is treated with simvastatin.  Plan repeat CTA  of the chest one year   Grace Isaac MD      [address] [place] [address] Office [phone]   Beeper [phone]  10/27/2016 1:35 PM

## 2016-11-15 DIAGNOSIS — Z8546 Personal history of malignant neoplasm of prostate: Secondary | ICD-10-CM | POA: Diagnosis not present

## 2016-12-16 DIAGNOSIS — J069 Acute upper respiratory infection, unspecified: Secondary | ICD-10-CM | POA: Diagnosis not present

## 2016-12-16 DIAGNOSIS — J309 Allergic rhinitis, unspecified: Secondary | ICD-10-CM | POA: Diagnosis not present

## 2016-12-21 ENCOUNTER — Other Ambulatory Visit: Payer: Self-pay | Admitting: *Deleted

## 2016-12-21 DIAGNOSIS — H01021 Squamous blepharitis right upper eyelid: Secondary | ICD-10-CM | POA: Diagnosis not present

## 2016-12-21 DIAGNOSIS — H35373 Puckering of macula, bilateral: Secondary | ICD-10-CM | POA: Diagnosis not present

## 2016-12-21 DIAGNOSIS — H01024 Squamous blepharitis left upper eyelid: Secondary | ICD-10-CM | POA: Diagnosis not present

## 2016-12-21 MED ORDER — LOSARTAN POTASSIUM 50 MG PO TABS: 50.0000 mg | ORAL_TABLET | Freq: Every day | ORAL | 0 refills | Status: DC

## 2016-12-21 MED ORDER — SIMVASTATIN 40 MG PO TABS: 40.0000 mg | ORAL_TABLET | Freq: Every day | ORAL | 0 refills | Status: DC

## 2017-01-09 DIAGNOSIS — J019 Acute sinusitis, unspecified: Secondary | ICD-10-CM | POA: Diagnosis not present

## 2017-02-06 ENCOUNTER — Encounter: Payer: Self-pay | Admitting: *Deleted

## 2017-02-08 ENCOUNTER — Other Ambulatory Visit: Payer: Self-pay | Admitting: Cardiovascular Disease

## 2017-02-08 MED ORDER — LOSARTAN POTASSIUM 50 MG PO TABS: 50.0000 mg | ORAL_TABLET | Freq: Every day | ORAL | 0 refills | Status: DC

## 2017-02-20 ENCOUNTER — Ambulatory Visit: Payer: Medicare Other | Admitting: Cardiovascular Disease

## 2017-03-01 ENCOUNTER — Ambulatory Visit (INDEPENDENT_AMBULATORY_CARE_PROVIDER_SITE_OTHER): Payer: Medicare Other | Admitting: Cardiovascular Disease

## 2017-03-01 ENCOUNTER — Encounter: Payer: Self-pay | Admitting: Cardiovascular Disease

## 2017-03-01 VITALS — BP 120/70 | HR 59 | Ht 72.0 in | Wt 176.0 lb

## 2017-03-01 DIAGNOSIS — I2510 Atherosclerotic heart disease of native coronary artery without angina pectoris: Secondary | ICD-10-CM

## 2017-03-01 DIAGNOSIS — I10 Essential (primary) hypertension: Secondary | ICD-10-CM

## 2017-03-01 NOTE — Progress Notes (Signed)
Cardiology Office Note Date:  03/01/2017   ID:  JAYTON POPELKA, DOB 1928/01/10, MRN [id number]  PCP:  [name], MD  Cardiologist:  [name], MD    Chief Complaint  Patient presents with  . Follow-up     History of Present Illness: Jesse [name] is a 83 y.o. male who presents for  follow-up evaluation. The patient is followed for coronary artery disease status post remote CABG in 1992. He has a chronic right bundle branch block. The patient is followed by Dr. 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 He has been managed conservatively with medical therapy. He is now followed by Dr. Servando Snare for thoracic aortic aneurysmal disease with ongoing surveillance recommended. The maximum dimension of his thoracic aorta is in the range of 4.5 cm.  The patient is here alone today. He is reading a book about the [employer]. He feels well and has no specific cardiac complaints. He denies chest pain, chest pressure, or shortness of breath. He denies leg swelling. His aneurysmal disease is followed by vascular cardiac surgery. He's been compliant with his medications. He denies palpitations or lightheadedness.  Past Medical History:  Diagnosis Date  . AAA (abdominal aortic aneurysm) ([place])   . Arthritis   . Bronchitis    hx of appro 40 years ago  . Cancer ([place]) 2010   prostate ( 40 Txs. of radiation treatments)  . Coronary artery disease   . Environmental allergies   . GERD (gastroesophageal reflux disease)   . HOH (hard of hearing)    wears bilateral hearing aids  . Hyperlipidemia   . Hypertension   . Kidney stone 1968  . Myocardial infarction ([place]) 1992  . Pneumonia    hx of  . RBBB     Past Surgical History:  Procedure Laterality Date  . ABDOMINAL AORTIC ANEURYSM REPAIR  08-13-1993   Dr Cameron Sprang  . CARDIAC CATHETERIZATION    . COLONOSCOPY W/ POLYPECTOMY    . CORONARY ARTERY BYPASS GRAFT  03-01-1991   Dr. Servando Snare  . EYE  SURGERY     Detached retina (Left eye); bilateral cataract removal  . FALSE ANEURYSM REPAIR  05/07/2012   Procedure: REPAIR FALSE ANEURYSM;  Surgeon: Rosetta Posner, MD;  Location: [facility] OR;  Service: Vascular;  Laterality: Left;  Repair of Left Femoral Artery Aneurysm  . HERNIA REPAIR    . ORIF PATELLA Right 06/09/2015   Procedure: OPEN REDUCTION INTERNAL (ORIF) FIXATION PATELLA;  Surgeon: Marybelle Killings, MD;  Location: [facility];  Service: Orthopedics;  Laterality: Right;  . repair of bowel obstruction  1999   Dr. Dalbert Batman  . repair of ventral hernia  04-20-1994   P. Cameron Sprang MD  . resection femoral artery  03/19/2012   Dr. [name] Early    Current Outpatient Prescriptions  Medication Sig Dispense Refill  . acetaminophen (TYLENOL) 325 MG tablet Take 650 mg by mouth every 6 (six) hours as needed for mild pain.    Marland Kitchen Alum Hydroxide-Mag Carbonate (GAVISCON PO) Take 1 tablet by mouth daily as needed (upset stomach).     Marland Kitchen aspirin EC 325 MG tablet Take 1 tablet (325 mg total) by mouth daily. 30 tablet 0  . carboxymethylcellulose (REFRESH PLUS) 0.5 % SOLN Apply 1 drop to eye 3 (three) times daily as needed (dry eyes).     . carvedilol (COREG) 12.5 MG tablet Take 1 tablet (12.5 mg total) by mouth 2 (two) times daily with  a meal. 180 tablet 2  . dicyclomine (BENTYL) 20 MG tablet Take 1 tablet (20 mg total) by mouth 2 (two) times daily. 20 tablet 0  . fenofibrate 160 MG tablet Take 1 tablet (160 mg total) by mouth daily. 90 tablet 2  . hydrochlorothiazide (HYDRODIURIL) 12.5 MG tablet Take 1 tablet (12.5 mg total) by mouth daily. 90 tablet 3  . Lactase (DAIRY DIGESTIVE SUPPLEMENT PO) Take 1 tablet by mouth daily as needed (when eating foods containing lactose).     Marland Kitchen loratadine (CLARITIN) 10 MG tablet Take 10 mg by mouth daily.     Marland Kitchen losartan (COZAAR) 50 MG tablet Take 1 tablet (50 mg total) by mouth daily. 60 tablet 0  . omeprazole (PRILOSEC) 20 MG capsule Take 1 capsule (20 mg total) by mouth daily. 30  capsule 0  . Polyvinyl Alcohol-Povidone (REFRESH OP) Apply 1 drop to eye 3 (three) times daily as needed. For dry eyes    . Probiotic Product (PROBIOTIC PO) Take 1 capsule by mouth daily.    . simvastatin (ZOCOR) 40 MG tablet Take 1 tablet (40 mg total) by mouth daily at 6 PM. 60 tablet 0   No current facility-administered medications for this visit.     Allergies:   Lactose intolerance (gi) and Adhesive [tape]   Social History:  The patient  reports that he quit smoking about 48 years ago. His smoking use included Cigarettes. He quit after 20.00 years of use. He has never used smokeless tobacco. He reports that he does not drink alcohol or use drugs.   Family History:  The patient's family history includes Cancer in his father, mother, and sister; Diabetes in his sister; Heart attack in his sister; Varicose Veins in his father.   ROS:  Please see the history of present illness.  All other systems are reviewed and negative.   PHYSICAL EXAM: VS:  BP 120/70   Pulse (!) 59   Ht 6' (1.829 m)   Wt 176 lb (79.8 kg)   BMI 23.87 kg/m  , BMI Body mass index is 23.87 kg/m. GEN: Well nourished, well developed, in no acute distress - pleasant elderly male HEENT: normal  Neck: no JVD, no masses. No carotid bruits Cardiac: RRR with no murmur               Respiratory:  clear to auscultation bilaterally, normal work of breathing GI: soft, nontender, nondistended, + BS MS: no deformity or atrophy  Ext: no pretibial edema Skin: warm and dry, no rash Neuro:  Strength and sensation are intact Psych: euthymic mood, full affect  EKG:  EKG is ordered today. The ekg ordered today shows normal sinus rhythm 59 bpm, right bundle branch block, left axis deviation, no change from previous. Occasional PVC.  Recent Labs: No results found for requested labs within last 8760 hours.   Lipid Panel     Component Value Date/Time   CHOL 123 05/20/2014 0833   TRIG 64.0 05/20/2014 0833   TRIG 84 05/05/2006  0839   HDL 28.90 (L) 05/20/2014 0833   CHOLHDL 4 05/20/2014 0833   VLDL 12.8 05/20/2014 0833   LDLCALC 81 05/20/2014 0833      Wt Readings from Last 3 Encounters:  03/01/17 176 lb (79.8 kg)  10/27/16 182 lb (82.6 kg)  05/12/16 171 lb (77.6 kg)     Cardiac Studies Reviewed: 2D Echo 02/08/2016: Study Conclusions  - Left ventricle: Inferior , lateral and septal hypokinesis The   cavity size was  moderately dilated. There was mild focal basal   hypertrophy of the septum. Systolic function was mildly to   moderately reduced. The estimated ejection fraction was in the   range of 40% to 45%. Wall motion was normal; there were no   regional wall motion abnormalities. Doppler parameters are   consistent with abnormal left ventricular relaxation (grade 1   diastolic dysfunction). - Left atrium: The atrium was moderately dilated. - Atrial septum: No defect or patent foramen ovale was identified.  ASSESSMENT AND PLAN: 1.  Coronary artery disease, native vessel, without angina: Patient is status post remote CABG. His medical program is reviewed today.  2. Peripheral arterial disease with aneurysms of the descending thoracic aorta, abdominal aorta status post remote surgery, femoral and popliteal arteries. He's followed closely by vascular surgery.  3. Hypertension: Blood pressure is well controlled.  4. Hyperlipidemia: The patient is treated with simvastatin. Will update his lipids and LFTs.  5. Chronic systolic heart failure: The patient's most recent echocardiogram is reviewed as above. LVEF is approximately 45% which was stable last year compared to previous imaging studies. The patient is on a good regimen with an ARB and beta blocker. He has New York Heart Association functional class 1-2 symptoms.   Current medicines are reviewed with the patient today.  The patient does not have concerns regarding medicines.  Labs/ tests ordered today include:   Orders Placed This Encounter    Procedures  . Lipid Profile  . Comp Met (CMET)  . EKG 12-Lead    Disposition:   FU one year  Signed, [name], MD  03/01/2017 1:28 PM    [facility] [address], [place], [place]  [address] Phone: [phone]; Fax: [phone]

## 2017-03-01 NOTE — Patient Instructions (Signed)
Medication Instructions:  Your physician recommends that you continue on your current medications as directed. Please refer to the Current Medication list given to you today.   Labwork: Your physician recommends that you return for lab work in: the next week  You will need to FAST for this appointment - nothing to eat or drink after midnight the night before except water.    Testing/Procedures: None Ordered   Follow-Up: Your physician wants you to follow-up in: 1 year with Dr. Burt Knack.  You will receive a reminder letter in the mail two months in advance. If you don't receive a letter, please call our office to schedule the follow-up appointment.   If you need a refill on your cardiac medications before your next appointment, please call your pharmacy.   Thank you for choosing [facility]! Christen Bame, RN [phone]

## 2017-03-02 ENCOUNTER — Other Ambulatory Visit: Payer: Medicare Other | Admitting: *Deleted

## 2017-03-02 DIAGNOSIS — I10 Essential (primary) hypertension: Secondary | ICD-10-CM | POA: Diagnosis not present

## 2017-03-02 DIAGNOSIS — I2510 Atherosclerotic heart disease of native coronary artery without angina pectoris: Secondary | ICD-10-CM | POA: Diagnosis not present

## 2017-03-02 LAB — COMPREHENSIVE METABOLIC PANEL
ALBUMIN: 3.7 g/dL (ref 3.5–4.7)
ALT: 12 IU/L (ref 0–44)
AST: 15 IU/L (ref 0–40)
Albumin/Globulin Ratio: 1.9 (ref 1.2–2.2)
Alkaline Phosphatase: 55 IU/L (ref 39–117)
BUN / CREAT RATIO: 31 — AB (ref 10–24)
BUN: 26 mg/dL (ref 8–27)
Bilirubin Total: 0.6 mg/dL (ref 0.0–1.2)
CO2: 23 mmol/L (ref 20–29)
CREATININE: 0.84 mg/dL (ref 0.76–1.27)
Calcium: 9.4 mg/dL (ref 8.6–10.2)
Chloride: 106 mmol/L (ref 96–106)
GFR calc non Af Amer: 79 mL/min/{1.73_m2} (ref >59)
GFR, EST AFRICAN AMERICAN: 92 mL/min/{1.73_m2} (ref >59)
GLUCOSE: 89 mg/dL (ref 65–99)
Globulin, Total: 1.9 g/dL (ref 1.5–4.5)
Potassium: 4.3 mmol/L (ref 3.5–5.2)
Sodium: 141 mmol/L (ref 134–144)
TOTAL PROTEIN: 5.6 g/dL — AB (ref 6.0–8.5)

## 2017-03-02 LAB — LIPID PANEL
CHOL/HDL RATIO: 3.3 ratio (ref 0.0–5.0)
Cholesterol, Total: 106 mg/dL (ref 100–199)
HDL: 32 mg/dL — ABNORMAL LOW (ref >39)
LDL Calculated: 63 mg/dL (ref 0–99)
Triglycerides: 57 mg/dL (ref 0–149)
VLDL Cholesterol Cal: 11 mg/dL (ref 5–40)

## 2017-03-21 DIAGNOSIS — Z8679 Personal history of other diseases of the circulatory system: Secondary | ICD-10-CM | POA: Diagnosis not present

## 2017-03-21 DIAGNOSIS — Z23 Encounter for immunization: Secondary | ICD-10-CM | POA: Diagnosis not present

## 2017-03-21 DIAGNOSIS — Z0000 Encounter for general adult medical examination without abnormal findings: Secondary | ICD-10-CM | POA: Diagnosis not present

## 2017-03-21 DIAGNOSIS — Z8546 Personal history of malignant neoplasm of prostate: Secondary | ICD-10-CM | POA: Diagnosis not present

## 2017-03-30 ENCOUNTER — Other Ambulatory Visit: Payer: Self-pay | Admitting: *Deleted

## 2017-03-30 ENCOUNTER — Other Ambulatory Visit: Payer: Self-pay

## 2017-03-30 MED ORDER — LOSARTAN POTASSIUM 50 MG PO TABS: 50.0000 mg | ORAL_TABLET | Freq: Every day | ORAL | 3 refills | Status: DC

## 2017-03-30 MED ORDER — SIMVASTATIN 40 MG PO TABS: 40.0000 mg | ORAL_TABLET | Freq: Every day | ORAL | 3 refills | Status: DC

## 2017-03-30 MED ORDER — HYDROCHLOROTHIAZIDE 12.5 MG PO TABS: 12.5000 mg | ORAL_TABLET | Freq: Every day | ORAL | 3 refills | Status: DC

## 2017-05-09 ENCOUNTER — Ambulatory Visit: Payer: Medicare Other | Admitting: Vascular Surgery

## 2017-05-09 ENCOUNTER — Encounter (HOSPITAL_COMMUNITY): Payer: Medicare Other

## 2017-06-12 DIAGNOSIS — H35431 Paving stone degeneration of retina, right eye: Secondary | ICD-10-CM | POA: Diagnosis not present

## 2017-06-12 DIAGNOSIS — H43811 Vitreous degeneration, right eye: Secondary | ICD-10-CM | POA: Diagnosis not present

## 2017-06-12 DIAGNOSIS — H35373 Puckering of macula, bilateral: Secondary | ICD-10-CM | POA: Diagnosis not present

## 2017-06-12 DIAGNOSIS — H35421 Microcystoid degeneration of retina, right eye: Secondary | ICD-10-CM | POA: Diagnosis not present

## 2017-06-20 ENCOUNTER — Other Ambulatory Visit: Payer: Self-pay

## 2017-06-20 MED ORDER — FENOFIBRATE 160 MG PO TABS: 160.0000 mg | ORAL_TABLET | Freq: Every day | ORAL | 3 refills | Status: DC

## 2017-06-20 MED ORDER — CARVEDILOL 12.5 MG PO TABS: 12.5000 mg | ORAL_TABLET | Freq: Two times a day (BID) | ORAL | 2 refills | Status: DC

## 2017-07-04 ENCOUNTER — Encounter (HOSPITAL_COMMUNITY): Payer: Medicare Other

## 2017-07-04 ENCOUNTER — Ambulatory Visit: Payer: Medicare Other | Admitting: Vascular Surgery

## 2017-08-23 ENCOUNTER — Other Ambulatory Visit: Payer: Self-pay | Admitting: *Deleted

## 2017-08-23 MED ORDER — LOSARTAN POTASSIUM 50 MG PO TABS: 50.0000 mg | ORAL_TABLET | Freq: Every day | ORAL | 1 refills | Status: DC

## 2017-08-29 ENCOUNTER — Ambulatory Visit (INDEPENDENT_AMBULATORY_CARE_PROVIDER_SITE_OTHER): Payer: Medicare Other | Admitting: Vascular Surgery

## 2017-08-29 ENCOUNTER — Ambulatory Visit (HOSPITAL_COMMUNITY)
Admission: RE | Admit: 2017-08-29 | Discharge: 2017-08-29 | Disposition: A | Discharge status: Home / Self Care | Payer: Medicare Other | Source: Ambulatory Visit | Attending: Vascular Surgery | Admitting: Vascular Surgery

## 2017-08-29 ENCOUNTER — Ambulatory Visit (INDEPENDENT_AMBULATORY_CARE_PROVIDER_SITE_OTHER)
Admission: RE | Admit: 2017-08-29 | Discharge: 2017-08-29 | Disposition: A | Discharge status: Home / Self Care | Payer: Medicare Other | Source: Ambulatory Visit | Attending: Vascular Surgery | Admitting: Vascular Surgery

## 2017-08-29 ENCOUNTER — Encounter: Payer: Self-pay | Admitting: Vascular Surgery

## 2017-08-29 VITALS — BP 120/72 | HR 58 | Resp 20 | Ht 72.0 in | Wt 186.0 lb

## 2017-08-29 DIAGNOSIS — I724 Aneurysm of artery of lower extremity: Secondary | ICD-10-CM

## 2017-08-29 NOTE — Progress Notes (Signed)
[facility]ist [facility]  Patient name: Jesse [name] MRN: [id number] DOB: [birth year] Sex: male  REASON FOR VISIT: Follow-up of diffuse aneurysmal disease  HPI: Jesse [name] is a 84 y.o. male here today for follow-up.  He is a very pleasant active healthy 84 year old gentleman.  He is quite independent.  He has known history of arteriomegaly and multiple diffuse aneurysms.  He has a remote history of open aneurysm Jesse [name].  Has undergone subsequent femoral artery repair as well.  He has known bilateral popliteal aneurysms and also known internal iliac artery aneurysms.  He remains asymptomatic from all of these and is active walker  Past Medical History:  Diagnosis Date  . AAA (abdominal aortic aneurysm) ([place])   . Arthritis   . Bronchitis    hx of appro 40 years ago  . Cancer ([place]) 2010   prostate ( 40 Txs. of radiation treatments)  . Coronary artery disease   . Environmental allergies   . GERD (gastroesophageal reflux disease)   . HOH (hard of hearing)    wears bilateral hearing aids  . Hyperlipidemia   . Hypertension   . Kidney stone 1968  . Myocardial infarction ([place]) 1992  . Pneumonia    hx of  . RBBB     Family History  Problem Relation Age of Onset  . Cancer Mother   . Cancer Father   . Varicose Veins Father   . Cancer Sister   . Diabetes Sister   . Heart attack Sister     SOCIAL HISTORY: Social History   Tobacco Use  . Smoking status: Former Smoker    Years: 20.00    Types: Cigarettes    Last attempt to quit: 07/25/1968    Years since quitting: 49.1  . Smokeless tobacco: Never Used  Substance Use Topics  . Alcohol use: No    Allergies  Allergen Reactions  . Lactose Intolerance (Gi) Other (See Comments)    Gas, bloating, stomach indigestion   . Adhesive [Tape] Itching and Rash    Current Outpatient Medications  Medication Sig Dispense Refill  . acetaminophen (TYLENOL) 325 MG tablet Take 650 mg by  mouth every 6 (six) hours as needed for mild pain.    Marland Kitchen Alum Hydroxide-Mag Carbonate (GAVISCON PO) Take 1 tablet by mouth daily as needed (upset stomach).     Marland Kitchen aspirin 81 MG tablet Take 81 mg by mouth daily.    . carboxymethylcellulose (REFRESH PLUS) 0.5 % SOLN Apply 1 drop to eye 3 (three) times daily as needed (dry eyes).     . carvedilol (COREG) 12.5 MG tablet Take 1 tablet (12.5 mg total) by mouth 2 (two) times daily with a meal. 180 tablet 2  . dicyclomine (BENTYL) 20 MG tablet Take 1 tablet (20 mg total) by mouth 2 (two) times daily. 20 tablet 0  . fenofibrate 160 MG tablet Take 1 tablet (160 mg total) by mouth daily. 90 tablet 3  . hydrochlorothiazide (HYDRODIURIL) 12.5 MG tablet Take 1 tablet (12.5 mg total) by mouth daily. 90 tablet 3  . Lactase (DAIRY DIGESTIVE SUPPLEMENT PO) Take 1 tablet by mouth daily as needed (when eating foods containing lactose).     Marland Kitchen loratadine (CLARITIN) 10 MG tablet Take 10 mg by mouth daily.     Marland Kitchen losartan (COZAAR) 50 MG tablet Take 1 tablet (50 mg total) by mouth daily. 90 tablet 1  . omeprazole (PRILOSEC) 20 MG capsule Take 1 capsule (20 mg total) by  mouth daily. 30 capsule 0  . Polyvinyl Alcohol-Povidone (REFRESH OP) Apply 1 drop to eye 3 (three) times daily as needed. For dry eyes    . Probiotic Product (PROBIOTIC PO) Take 1 capsule by mouth daily.    . simvastatin (ZOCOR) 40 MG tablet Take 1 tablet (40 mg total) by mouth daily at 6 PM. 90 tablet 3  . aspirin EC 325 MG tablet Take 1 tablet (325 mg total) by mouth daily. (Patient not taking: Reported on 08/29/2017) 30 tablet 0   No current facility-administered medications for this visit.     REVIEW OF SYSTEMS:  [X]  denotes positive finding, [ ]  denotes negative finding Cardiac  Comments:  Chest pain or chest pressure:    Shortness of breath upon exertion:    Short of breath when lying flat:    Irregular heart rhythm:        Vascular    Pain in calf, thigh, or hip brought on by ambulation:      Pain in feet at night that wakes you up from your sleep:     Blood clot in your veins:    Leg swelling:           PHYSICAL EXAM: Vitals:   08/29/17 1031  BP: 120/72  Pulse: (!) 58  Resp: 20  SpO2: 99%  Weight: 186 lb (84.4 kg)  Height: 6' (1.829 m)    GENERAL: The patient is a well-nourished male, in no acute distress. The vital signs are documented above. CARDIOVASCULAR: 2+ radial pulses bilaterally.  3+ femoral 3+ popliteal and 2+ posterior tibial pulses bilaterally.  Abdomen reveals diastases recti versus large hernia with no tenderness. PULMONARY: There is good air exchange  MUSCULOSKELETAL: There are no major deformities or cyanosis. NEUROLOGIC: No focal weakness or paresthesias are detected. SKIN: There are no ulcers or rashes noted. PSYCHIATRIC: The patient has a normal affect.  DATA:  Duplex today of his aorta shows 3.9 cm bilateral lower extremity duplex shows diffuse dilatation throughout with maximal diameter of his distal superficial femoral artery at 3.0 cm and the maximal diameter on the left of 2.5 cm.  MEDICAL ISSUES: Active healthy 84 year old gentleman with aneurysms.  He has a known 4 cm ascending arch aneurysm known internal iliac artery aneurysms by CT scan.  Also with common superficial femoral and popliteal dilatation throughout.  A long discussion with the patient explaining that any 1 of these could put him at risk for either rupture or throat.  Explained that it would be quite a magnitude of procedures to fix all of these.  He does remain asymptomatic and I would recommend continued surveillance only.  He is comfortable with this suggested commendation.  We will see him again in 1 year at which time a CT scan of his abdomen and pelvis with runoff for continued delineation of his diffuse aneurysmal disease I did explain symptoms of aneurysmal disease and he understands to report immediately to the emergency should this occur    Rosetta Posner, MD  [facility] Tel [phone] Pager [phone]

## 2017-08-30 LAB — VAS US LOWER EXTREMITY ARTERIAL DUPLEX
LSFDPSV: -66 cm/s
LSFMPSV: -69 cm/s
Left ant tibial distal sys: 73 cm/s
Left super femoral prox sys PSV: -41 cm/s
RIGHT ANT DIST TIBAL SYS PSV: 60 cm/s
RSFDPSV: -13 cm/s
RSFMPSV: -45 cm/s
RTIBDISTSYS: 59 cm/s
Right super femoral prox sys PSV: 43 cm/s
left post tibial dist sys: -37 cm/s

## 2017-10-14 DIAGNOSIS — L03032 Cellulitis of left toe: Secondary | ICD-10-CM | POA: Diagnosis not present

## 2017-10-14 DIAGNOSIS — B351 Tinea unguium: Secondary | ICD-10-CM | POA: Diagnosis not present

## 2017-10-14 DIAGNOSIS — M109 Gout, unspecified: Secondary | ICD-10-CM | POA: Diagnosis not present

## 2017-10-16 ENCOUNTER — Other Ambulatory Visit: Payer: Self-pay | Admitting: Cardiothoracic Surgery

## 2017-10-16 DIAGNOSIS — I712 Thoracic aortic aneurysm, without rupture, unspecified: Secondary | ICD-10-CM

## 2017-10-16 DIAGNOSIS — I724 Aneurysm of artery of lower extremity: Secondary | ICD-10-CM

## 2017-10-17 DIAGNOSIS — L03032 Cellulitis of left toe: Secondary | ICD-10-CM | POA: Diagnosis not present

## 2017-10-17 DIAGNOSIS — M109 Gout, unspecified: Secondary | ICD-10-CM | POA: Diagnosis not present

## 2017-10-17 DIAGNOSIS — B351 Tinea unguium: Secondary | ICD-10-CM | POA: Diagnosis not present

## 2017-10-20 DIAGNOSIS — B351 Tinea unguium: Secondary | ICD-10-CM | POA: Diagnosis not present

## 2017-10-20 DIAGNOSIS — L600 Ingrowing nail: Secondary | ICD-10-CM | POA: Diagnosis not present

## 2017-11-30 ENCOUNTER — Ambulatory Visit: Payer: Medicare Other | Admitting: Cardiothoracic Surgery

## 2017-11-30 ENCOUNTER — Ambulatory Visit
Admission: RE | Admit: 2017-11-30 | Discharge: 2017-11-30 | Disposition: A | Discharge status: Home / Self Care | Payer: Medicare Other | Source: Ambulatory Visit | Attending: Cardiothoracic Surgery | Admitting: Cardiothoracic Surgery

## 2017-11-30 ENCOUNTER — Other Ambulatory Visit: Payer: Self-pay

## 2017-11-30 ENCOUNTER — Encounter: Payer: Self-pay | Admitting: Cardiothoracic Surgery

## 2017-11-30 VITALS — BP 102/70 | HR 64 | Resp 16 | Ht 72.0 in | Wt 185.0 lb

## 2017-11-30 DIAGNOSIS — I712 Thoracic aortic aneurysm, without rupture, unspecified: Secondary | ICD-10-CM

## 2017-11-30 MED ORDER — IOPAMIDOL (ISOVUE-370) INJECTION 76%
75.0000 mL | Freq: Once | INTRAVENOUS | Status: AC | PRN
  Administered 2017-11-30: 75 mL via INTRAVENOUS

## 2017-11-30 NOTE — Patient Instructions (Signed)

## 2017-11-30 NOTE — Progress Notes (Signed)
[address]       [place] [address]             [phone]                    Viral W Mezera  Medical Record #[id number] Date of Birth: [birth year]  Referring: Early, [name], MD Primary Care: [name], MD  Chief Complaint:    Chief Complaint  Patient presents with  . Thoracic Aortic Aneurysm    1 year f/u with CTA Chest    History of Present Illness:     Jesse [name] 84 y.o. male is seen in the office for incidental finding of  thoracic aneurysm. He has been followed by Dr Early  for abdominal aneurysm disease known since 1992 when he had CABG by me.   He had open abdominal aneurysm repair and bilateral femoral  aneurysm repair .        Since last seen the patient has no new complaints.  He returns today with a follow-up CTA of the chest.   Current Activity/ Functional Status:  Patient is independent with mobility/ambulation, transfers, ADL's, IADL's.   Zubrod Score: At the time of surgery this patient's most appropriate activity status/level should be described as: []     0    Normal activity, no symptoms [x]     1    Restricted in physical strenuous activity but ambulatory, able to do out light work []     2    Ambulatory and capable of self care, unable to do work activities, up and about               >50 % of waking hours                              []     3    Only limited self care, in bed greater than 50% of waking hours []     4    Completely disabled, no self care, confined to bed or chair []     5    Moribund   Past Medical History:  Diagnosis Date  . AAA (abdominal aortic aneurysm) ([place])   . Arthritis   . Bronchitis    hx of appro 40 years ago  . Cancer ([place]) 2010   prostate ( 40 Txs. of radiation treatments)  . Coronary artery disease   . Environmental allergies   . GERD (gastroesophageal reflux disease)   . HOH (hard of hearing)    wears bilateral hearing aids  . Hyperlipidemia   . Hypertension   . Kidney stone  1968  . Myocardial infarction ([place]) 1992  . Pneumonia    hx of  . RBBB     Past Surgical History:  Procedure Laterality Date  . ABDOMINAL AORTIC ANEURYSM REPAIR  08-13-1993   Dr Cameron Sprang  . CARDIAC CATHETERIZATION    . COLONOSCOPY W/ POLYPECTOMY    . CORONARY ARTERY BYPASS GRAFT  03-01-1991   Dr. Servando Snare  . EYE SURGERY     Detached retina (Left eye); bilateral cataract removal  . FALSE ANEURYSM REPAIR  05/07/2012   Procedure: REPAIR FALSE ANEURYSM;  Surgeon: Rosetta Posner, MD;  Location: [name] OR;  Service: Vascular;  Laterality: Left;  Repair of Left Femoral Artery Aneurysm  . HERNIA REPAIR    . ORIF PATELLA Right 06/09/2015   Procedure: OPEN REDUCTION INTERNAL (  ORIF) FIXATION PATELLA;  Surgeon: Marybelle Killings, MD;  Location: [facility];  Service: Orthopedics;  Laterality: Right;  . repair of bowel obstruction  1999   Dr. Dalbert Batman  . repair of ventral hernia  04-20-1994   P. Cameron Sprang MD  . resection femoral artery  03/19/2012   Dr. [name] Early    Family History  Problem Relation Age of Onset  . Cancer Mother   . Cancer Father   . Varicose Veins Father   . Cancer Sister   . Diabetes Sister   . Heart attack Sister     Social History   Socioeconomic History  . Marital status: Widowed    Spouse name: Not on file  . Number of children: Not on file  . Years of education: Not on file  . Highest education level: Not on file  Occupational History  . Not on file  Social Needs  . Financial resource strain: Not on file  . Food insecurity:    Worry: Not on file    Inability: Not on file  . Transportation needs:    Medical: Not on file    Non-medical: Not on file  Tobacco Use  . Smoking status: Former Smoker    Years: 20.00    Types: Cigarettes    Last attempt to quit: 07/25/1968    Years since quitting: 49.3  . Smokeless tobacco: Never Used  Substance and Sexual Activity  . Alcohol use: No  . Drug use: No  . Sexual activity: Not on file  Lifestyle  . Physical  activity:    Days per week: Not on file    Minutes per session: Not on file  . Stress: Not on file  Relationships  . Social connections:    Talks on phone: Not on file    Gets together: Not on file    Attends religious service: Not on file    Active member of club or organization: Not on file    Attends meetings of clubs or organizations: Not on file    Relationship status: Not on file  . Intimate partner violence:    Fear of current or ex partner: Not on file    Emotionally abused: Not on file    Physically abused: Not on file    Forced sexual activity: Not on file  Other Topics Concern  . Not on file  Social History Narrative  . Not on file    Social History   Tobacco Use  Smoking Status Former Smoker  . Years: 20.00  . Types: Cigarettes  . Last attempt to quit: 07/25/1968  . Years since quitting: 49.3  Smokeless Tobacco Never Used    Social History   Substance and Sexual Activity  Alcohol Use No     Allergies  Allergen Reactions  . Lactose Intolerance (Gi) Other (See Comments)    Gas, bloating, stomach indigestion   . Adhesive [Tape] Itching and Rash    Current Outpatient Medications  Medication Sig Dispense Refill  . acetaminophen (TYLENOL) 325 MG tablet Take 650 mg by mouth every 6 (six) hours as needed for mild pain.    Marland Kitchen Alum Hydroxide-Mag Carbonate (GAVISCON PO) Take 1 tablet by mouth daily as needed (upset stomach).     Marland Kitchen aspirin 81 MG tablet Take 81 mg by mouth daily.    . carboxymethylcellulose (REFRESH PLUS) 0.5 % SOLN Apply 1 drop to eye 3 (three) times daily as needed (dry eyes).     . carvedilol (COREG)  12.5 MG tablet Take 1 tablet (12.5 mg total) by mouth 2 (two) times daily with a meal. 180 tablet 2  . dicyclomine (BENTYL) 20 MG tablet Take 1 tablet (20 mg total) by mouth 2 (two) times daily. 20 tablet 0  . fenofibrate 160 MG tablet Take 1 tablet (160 mg total) by mouth daily. 90 tablet 3  . hydrochlorothiazide (HYDRODIURIL) 12.5 MG tablet Take 1  tablet (12.5 mg total) by mouth daily. 90 tablet 3  . Lactase (DAIRY DIGESTIVE SUPPLEMENT PO) Take 1 tablet by mouth daily as needed (when eating foods containing lactose).     Marland Kitchen loratadine (CLARITIN) 10 MG tablet Take 10 mg by mouth daily.     Marland Kitchen losartan (COZAAR) 50 MG tablet Take 1 tablet (50 mg total) by mouth daily. 90 tablet 1  . omeprazole (PRILOSEC) 20 MG capsule Take 1 capsule (20 mg total) by mouth daily. 30 capsule 0  . Polyvinyl Alcohol-Povidone (REFRESH OP) Apply 1 drop to eye 3 (three) times daily as needed. For dry eyes    . Probiotic Product (PROBIOTIC PO) Take 1 capsule by mouth daily.    . simvastatin (ZOCOR) 40 MG tablet Take 1 tablet (40 mg total) by mouth daily at 6 PM. 90 tablet 3   No current facility-administered medications for this visit.       Review of Systems:     Cardiac Review of Systems: Y or N  Chest Pain [ n  ]  Resting SOB [  n ] Exertional SOB  [  n]  Orthopnea [ n ]   Pedal Edema [n   ]    Palpitations [n  ] Syncope  Florencio.Farrier  ]   Presyncope [  n ]  General Review of Systems: [Y] = yes [  ]=no Constitional: recent weight change [ n ];  Wt loss over the last 3 months [   ] anorexia [  ]; fatigue [  ]; nausea [  ]; night sweats [  ]; fever [  ]; or chills [  ];          Dental: poor dentition[  ]; Last Dentist visit:   Eye : blurred vision [  ]; diplopia [   ]; vision changes [  ];  Amaurosis fugax[  ]; Resp: cough [ ] ;  wheezing[  ];  hemoptysis[  ]; shortness of breath[  ]; paroxysmal nocturnal dyspnea[] ; dyspnea on exertion[  ]; or orthopnea[ n ];  GI:  gallstones[  ], vomiting[  ];  dysphagia[  ]; melena[  ];  hematochezia [  ]; heartburn[  ];   Hx of  Colonoscopy[  ]; GU: kidney stones [  ]; hematuria[  ];   dysuria [  ];  nocturia[  ];  history of     obstruction [  ]; urinary frequency [  ]             Skin: rash, swelling[  ];, hair loss[  ];  peripheral edema[  ];  or itching[  ]; Musculosketetal: myalgias[  ];  joint swelling[  ];  joint erythema[  ];   joint pain[  ];  back pain[  ];  Heme/Lymph: bruising[  ];  bleeding[  ];  anemia[  ];  Neuro: TIA[  ];  headaches[  ];  stroke[  ];  vertigo[  ];  seizures[  n];   paresthesias[  ];  difficulty walking[ n ];  Psych:depression[  ]; anxiety[  ];  Endocrine: diabetes[  ];  thyroid dysfunction[  ];  Immunizations: Flu up to date Blue.Reese ]; Pneumococcal up to date [  ];  Other:  Physical Exam: BP 102/70 (BP Location: Left Arm, Patient Position: Sitting, Cuff Size: Normal)   Pulse 64   Resp 16   Ht 6' (1.829 m)   Wt 185 lb (83.9 kg)   SpO2 98% Comment: RA  BMI 25.09 kg/m   PHYSICAL EXAMINATION: General appearance: alert and cooperative Head: Normocephalic, without obvious abnormality, atraumatic Neck: no adenopathy, no carotid bruit, no JVD, supple, symmetrical, trachea midline and thyroid not enlarged, symmetric, no tenderness/mass/nodules Lymph nodes: Cervical, supraclavicular, and axillary nodes normal. Resp: clear to auscultation bilaterally Back: symmetric, no curvature. ROM normal. No CVA tenderness. Cardio: regular rate and rhythm, S1, S2 normal, no murmur, click, rub or gallop GI: soft, non-tender; bowel sounds normal; no masses,  no organomegaly Extremities: extremities normal, atraumatic, no cyanosis or edema and Homans sign is negative, no sign of DVT Neurologic: Grossly normal  Bilateral groin incisions present well-healed Bilateral popliteal artery pulses easily felt DP PT pulses present bilaterally  Diagnostic Studies & Laboratory data:     Recent Radiology Findings:  Ct Angio Chest Aorta W/cm &/or Wo/cm  Result Date: 11/30/2017 CLINICAL DATA:  Thoracic aortic aneurysm without rupture. EXAM: CT ANGIOGRAPHY CHEST WITH CONTRAST TECHNIQUE: Multidetector CT imaging of the chest was performed using the standard protocol during bolus administration of intravenous contrast. Multiplanar CT image reconstructions and MIPs were obtained to evaluate the vascular anatomy. CONTRAST:  44mL  ISOVUE-370 IOPAMIDOL (ISOVUE-370) INJECTION 76% COMPARISON:  CT scan of May 20, 2016. FINDINGS: Cardiovascular: Stable 4.1 cm ascending thoracic aortic aneurysm is noted. Stable 4.5 cm proximal descending thoracic aortic aneurysm is noted. Great vessels are widely patent without significant stenosis. No dissection is noted. Status post coronary artery bypass graft. Mediastinum/Nodes: No enlarged mediastinal, hilar, or axillary lymph nodes. Thyroid gland, trachea, and esophagus demonstrate no significant findings. Lungs/Pleura: Lungs are clear. No pleural effusion or pneumothorax. Upper Abdomen: Cholelithiasis is noted. No other significant abnormality seen in the visualized portion of upper abdomen. Musculoskeletal: No chest wall abnormality. No acute or significant osseous findings. Review of the MIP images confirms the above findings. IMPRESSION: Stable 4.1 cm ascending thoracic aortic aneurysm. Stable aneurysmal dilatation of proximal descending thoracic aorta at 4.5 cm. Recommend annual imaging followup by CTA or MRA. This recommendation follows 2010 ACCF/AHA/AATS/ACR/ASA/SCA/SCAI/SIR/STS/SVM Guidelines for the Diagnosis and Management of Patients with Thoracic Aortic Disease. Circulation. 2010; 121: P824-M353. Cholelithiasis is noted. Aortic Atherosclerosis (ICD10-I70.0). Electronically Signed   By: Marijo Conception, M.D.   On: 11/30/2017 12:55   I have independently reviewed the above radiology studies  and reviewed the findings with the patient.   Ct Angio Chest Aorta W/cm &/or Wo/cm  Result Date: 10/27/2016 CLINICAL DATA:  Thoracic aortic aneurysm without rupture. EXAM: CT ANGIOGRAPHY CHEST WITH CONTRAST TECHNIQUE: Multidetector CT imaging of the chest was performed using the standard protocol during bolus administration of intravenous contrast. Multiplanar CT image reconstructions and MIPs were obtained to evaluate the vascular anatomy. CONTRAST:  75 mL of Isovue 370 intravenously. COMPARISON:  CT  scan of May 18, 2016. FINDINGS: Cardiovascular: Stable 4.2 cm ascending thoracic aortic aneurysm is noted. Stable 4.5 cm proximal descending thoracic aortic aneurysm is noted. Aortic root measures 3.9 cm. Atherosclerosis of thoracic aorta is noted. No dissection is noted. Great vessels are widely patent. Status post coronary artery bypass graft. 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 Mediastinum/Nodes: No enlarged mediastinal, hilar,  or axillary lymph nodes. Thyroid gland, trachea, and esophagus demonstrate no significant findings. Lungs/Pleura: Lungs are clear. No pleural effusion or pneumothorax. Upper Abdomen: Cholelithiasis is noted. No other significant abnormality is noted in the visualized portion of upper abdomen. Musculoskeletal: No chest wall abnormality. No acute or significant osseous findings. Review of the MIP images confirms the above findings. IMPRESSION: Stable 4.2 cm ascending thoracic aortic aneurysm. Stable 4.5 cm proximal descending thoracic aortic aneurysm. Recommend annual imaging followup by CTA or MRA. This recommendation follows 2010 ACCF/AHA/AATS/ACR/ASA/SCA/SCAI/SIR/STS/SVM Guidelines for the Diagnosis and Management of Patients with Thoracic Aortic Disease. Circulation. 2010; 121: K025-K270. Lithiasis. Electronically Signed   By: Marijo Conception, M.D.   On: 10/27/2016 13:52   Ct Angio Chest Pe W/cm &/or Wo Cm  01/01/2016  CLINICAL DATA:  84 year old presenting with intermittent shortness of breath for the past several days which acutely worsened this morning. Intermittent cough. Elevated D-dimer. Prior CABG in 1992 and prior abdominal aortic aneurysm repair in 1995. EXAM: CT ANGIOGRAPHY CHEST WITH CONTRAST TECHNIQUE: Multidetector CT imaging of the chest was performed using the standard protocol during bolus administration of intravenous contrast. Multiplanar CT image reconstructions and MIPs were obtained to evaluate the vascular anatomy. CONTRAST:  100  ml Isovue 370 IV. COMPARISON:  No prior CT.  Multiple prior chest x-rays. FINDINGS: Technical quality:  Very good. Pulmonary embolism:  Absent. Cardiovascular: Prior sternotomy for CABG. Heart moderately to markedly enlarged. Mild left ventricular enlargement and possible mild left ventricular hypertrophy. Right ventricular enlargement and right ventricular hypertrophy. No pericardial effusion. Moderate atherosclerosis involving the thoracic and upper abdominal aorta with evidence of the descending thoracic aortic aneurysm with maximum diameter approximating 4.5 cm. Mediastinum/Lymph Nodes: No pathologically enlarged mediastinal, hilar or axillary lymph nodes. No mediastinal masses. Normal-appearing esophagus. Visualized thyroid gland unremarkable. Lungs/Pleura: Streaky and patchy opacities in the left lower lobe. Lungs otherwise clear. No evidence of interstitial pulmonary edema. No pulmonary parenchymal nodules or masses. No pleural effusions. Central airways patent with moderate to marked bronchial wall thickening. Upper abdomen: Numerous calcified gallstones without evidence of acute cholecystitis. No acute abnormalities. Musculoskeletal: Severe osseous demineralization. Exaggeration of the usual kyphosis. Mild degenerative disc disease and spondylosis involving the mid and lower thoracic spine. Review of the MIP images confirms the above findings. IMPRESSION: 1. No evidence of pulmonary embolism. 2. Moderate to marked cardiomegaly with biventricular enlarged. 3. Approximate 4.5 cm descending thoracic aortic aneurysm. Recommend annual imaging followup by CTA or MRA. This recommendation follows 2010 ACCF/AHA/AATS/ACR/ASA/SCA/SCAI/SIR/STS/SVM Guidelines for the Diagnosis and Management of Patients with Thoracic Aortic Disease. Circulation. 2010; 121: W237-S283. 4. Left lower lobe atelectasis and/or bronchopneumonia. Moderate to severe changes of bronchitis and/or asthma. 5. Cholelithiasis without evidence of acute  cholecystitis. Electronically Signed   By: Evangeline Dakin M.D.   On: 01/01/2016 15:10   Dg Abd 2 Views  01/01/2016  CLINICAL DATA:  Abdominal pain for 1 week.  Nausea. EXAM: ABDOMEN - 2 VIEW COMPARISON:  None. FINDINGS: Atelectasis seen in the left lung base. Lung bases otherwise normal. No free air, portal venous gas, or pneumatosis. Surgical clips are seen within the mid central abdomen. Surgical clips are seen in the pelvis is well. There is no evidence of bowel obstruction on this study. Both kidneys are largely obscured by bowel contents but no renal stones are identified. IMPRESSION: No acute abnormalities. Electronically Signed   By: Dorise Bullion III M.D   On: 01/01/2016 12:57     I have independently reviewed the above radiologic studies.  Recent Lab Findings: Lab Results  Component Value Date   WBC 6.7 01/01/2016   HGB 12.9 (L) 01/01/2016   HCT 40.6 01/01/2016   PLT 259 01/01/2016   GLUCOSE 89 03/02/2017   CHOL 106 03/02/2017   TRIG 57 03/02/2017   HDL 32 (L) 03/02/2017   LDLCALC 63 03/02/2017   ALT 12 03/02/2017   AST 15 03/02/2017   NA 141 03/02/2017   K 4.3 03/02/2017   CL 106 03/02/2017   CREATININE 0.84 03/02/2017   BUN 26 03/02/2017   CO2 23 03/02/2017   INR 1.23 06/09/2015      Assessment / Plan:  #1 stable 4.2 cm ascending thoracic aneurysm #2 stable 4.5 cm proximal descending thoracic aneurysm #3 history of coronary artery disease currently the patient has no symptoms of angina status post coronary artery bypass grafting 26 years ago-although the CT of the chest today does not fully evaluate for coronary artery disease the mammary artery and vein grafts are patent. #4 history of hypertension well controlled #5 history of hyperlipidemia currently on simvastatin  I reviewed with patient the CT scan of the chest done today and do not recommend any surgical intervention for his aneurysmal disease based on its current size.  He was cautioned about strenuous  lifting or the use of fluoroquinolone antibiotics.  We will plan to see him back in 1 year with a follow-up CTA of the chest  Patient was warned about not using Cipro and similar antibiotics. Recent studies have raised concern that fluoroquinolone antibiotics could be associated with an increased risk of aortic aneurysm Fluoroquinolones have non-antimicrobial properties that might jeopardise the integrity of the extracellular matrix of the vascular wall In a  propensity score matched cohort study in [place], there was a 66% increased rate of aortic aneurysm or dissection associated with oral fluoroquinolone use, compared with amoxicillin use, within a 60 day risk period from start of treatment  Grace Isaac MD      [address] [place] [address] Office [phone]   Beeper [phone]  11/30/2017 2:07 PM

## 2018-01-10 DIAGNOSIS — H01021 Squamous blepharitis right upper eyelid: Secondary | ICD-10-CM | POA: Diagnosis not present

## 2018-01-10 DIAGNOSIS — H35373 Puckering of macula, bilateral: Secondary | ICD-10-CM | POA: Diagnosis not present

## 2018-01-10 DIAGNOSIS — H01024 Squamous blepharitis left upper eyelid: Secondary | ICD-10-CM | POA: Diagnosis not present

## 2018-01-17 ENCOUNTER — Other Ambulatory Visit: Payer: Self-pay

## 2018-01-17 MED ORDER — LOSARTAN POTASSIUM 50 MG PO TABS: 50.0000 mg | ORAL_TABLET | Freq: Every day | ORAL | 0 refills | Status: DC

## 2018-02-09 ENCOUNTER — Other Ambulatory Visit: Payer: Self-pay | Admitting: Cardiovascular Disease

## 2018-02-15 DIAGNOSIS — R112 Nausea with vomiting, unspecified: Secondary | ICD-10-CM | POA: Diagnosis not present

## 2018-02-16 ENCOUNTER — Ambulatory Visit
Admission: RE | Admit: 2018-02-16 | Discharge: 2018-02-16 | Disposition: A | Discharge status: Home / Self Care | Payer: Medicare Other | Source: Ambulatory Visit | Attending: Family Medicine | Admitting: Family Medicine

## 2018-02-16 ENCOUNTER — Other Ambulatory Visit: Payer: Self-pay | Admitting: Family Medicine

## 2018-02-16 DIAGNOSIS — R1110 Vomiting, unspecified: Secondary | ICD-10-CM

## 2018-02-16 DIAGNOSIS — R1084 Generalized abdominal pain: Secondary | ICD-10-CM

## 2018-02-16 MED ORDER — IOPAMIDOL (ISOVUE-300) INJECTION 61%: 100.0000 mL | Freq: Once | INTRAVENOUS | Status: DC | PRN

## 2018-02-17 ENCOUNTER — Other Ambulatory Visit: Payer: Self-pay | Admitting: Family Medicine

## 2018-02-17 DIAGNOSIS — R109 Unspecified abdominal pain: Secondary | ICD-10-CM

## 2018-03-23 ENCOUNTER — Encounter

## 2018-03-27 ENCOUNTER — Other Ambulatory Visit: Payer: Self-pay | Admitting: Cardiovascular Disease

## 2018-03-27 DIAGNOSIS — Z23 Encounter for immunization: Secondary | ICD-10-CM | POA: Diagnosis not present

## 2018-03-27 DIAGNOSIS — Z1389 Encounter for screening for other disorder: Secondary | ICD-10-CM | POA: Diagnosis not present

## 2018-03-27 DIAGNOSIS — Z0000 Encounter for general adult medical examination without abnormal findings: Secondary | ICD-10-CM | POA: Diagnosis not present

## 2018-03-27 DIAGNOSIS — Z6825 Body mass index (BMI) 25.0-25.9, adult: Secondary | ICD-10-CM | POA: Diagnosis not present

## 2018-04-03 ENCOUNTER — Other Ambulatory Visit: Payer: Self-pay | Admitting: Cardiovascular Disease

## 2018-04-03 MED ORDER — SIMVASTATIN 40 MG PO TABS: ORAL_TABLET | ORAL | 0 refills | Status: DC

## 2018-04-03 NOTE — Telephone Encounter (Signed)
Pt's medication was sent to pt's pharmacy as requested. Confirmation received.  °

## 2018-04-17 ENCOUNTER — Other Ambulatory Visit: Payer: Self-pay | Admitting: Cardiovascular Disease

## 2018-04-24 ENCOUNTER — Other Ambulatory Visit: Payer: Self-pay | Admitting: Cardiovascular Disease

## 2018-05-04 ENCOUNTER — Inpatient Hospital Stay (HOSPITAL_COMMUNITY)
Admission: EM | Admit: 2018-05-04 | Discharge: 2018-05-08 | DRG: 377 | Disposition: A | Discharge status: Home / Self Care | Payer: Medicare Other | Attending: Internal Medicine | Admitting: Internal Medicine

## 2018-05-04 ENCOUNTER — Other Ambulatory Visit: Payer: Self-pay

## 2018-05-04 ENCOUNTER — Inpatient Hospital Stay (HOSPITAL_COMMUNITY): Payer: Medicare Other | Admitting: Anesthesiology

## 2018-05-04 ENCOUNTER — Encounter (HOSPITAL_COMMUNITY)
Admission: EM | Disposition: A | Discharge status: Home / Self Care | Payer: Self-pay | Source: Home / Self Care | Attending: Internal Medicine

## 2018-05-04 ENCOUNTER — Inpatient Hospital Stay (HOSPITAL_COMMUNITY): Payer: Medicare Other

## 2018-05-04 ENCOUNTER — Encounter (HOSPITAL_COMMUNITY): Payer: Self-pay | Admitting: Emergency Medicine

## 2018-05-04 DIAGNOSIS — K269 Duodenal ulcer, unspecified as acute or chronic, without hemorrhage or perforation: Secondary | ICD-10-CM | POA: Diagnosis not present

## 2018-05-04 DIAGNOSIS — K922 Gastrointestinal hemorrhage, unspecified: Secondary | ICD-10-CM | POA: Diagnosis not present

## 2018-05-04 DIAGNOSIS — N19 Unspecified kidney failure: Secondary | ICD-10-CM

## 2018-05-04 DIAGNOSIS — K921 Melena: Secondary | ICD-10-CM

## 2018-05-04 DIAGNOSIS — Z8546 Personal history of malignant neoplasm of prostate: Secondary | ICD-10-CM | POA: Diagnosis not present

## 2018-05-04 DIAGNOSIS — N179 Acute kidney failure, unspecified: Secondary | ICD-10-CM | POA: Diagnosis present

## 2018-05-04 DIAGNOSIS — R7989 Other specified abnormal findings of blood chemistry: Secondary | ICD-10-CM

## 2018-05-04 DIAGNOSIS — I10 Essential (primary) hypertension: Secondary | ICD-10-CM | POA: Diagnosis present

## 2018-05-04 DIAGNOSIS — K264 Chronic or unspecified duodenal ulcer with hemorrhage: Principal | ICD-10-CM | POA: Diagnosis present

## 2018-05-04 DIAGNOSIS — K219 Gastro-esophageal reflux disease without esophagitis: Secondary | ICD-10-CM | POA: Diagnosis present

## 2018-05-04 DIAGNOSIS — E861 Hypovolemia: Secondary | ICD-10-CM | POA: Diagnosis not present

## 2018-05-04 DIAGNOSIS — R578 Other shock: Secondary | ICD-10-CM | POA: Diagnosis present

## 2018-05-04 DIAGNOSIS — I252 Old myocardial infarction: Secondary | ICD-10-CM | POA: Diagnosis not present

## 2018-05-04 DIAGNOSIS — Z833 Family history of diabetes mellitus: Secondary | ICD-10-CM

## 2018-05-04 DIAGNOSIS — I4510 Unspecified right bundle-branch block: Secondary | ICD-10-CM | POA: Diagnosis not present

## 2018-05-04 DIAGNOSIS — Z951 Presence of aortocoronary bypass graft: Secondary | ICD-10-CM | POA: Diagnosis not present

## 2018-05-04 DIAGNOSIS — K222 Esophageal obstruction: Secondary | ICD-10-CM | POA: Diagnosis present

## 2018-05-04 DIAGNOSIS — K449 Diaphragmatic hernia without obstruction or gangrene: Secondary | ICD-10-CM | POA: Diagnosis present

## 2018-05-04 DIAGNOSIS — E785 Hyperlipidemia, unspecified: Secondary | ICD-10-CM | POA: Diagnosis not present

## 2018-05-04 DIAGNOSIS — I25810 Atherosclerosis of coronary artery bypass graft(s) without angina pectoris: Secondary | ICD-10-CM | POA: Diagnosis not present

## 2018-05-04 DIAGNOSIS — I25119 Atherosclerotic heart disease of native coronary artery with unspecified angina pectoris: Secondary | ICD-10-CM | POA: Diagnosis not present

## 2018-05-04 DIAGNOSIS — K8020 Calculus of gallbladder without cholecystitis without obstruction: Secondary | ICD-10-CM | POA: Diagnosis not present

## 2018-05-04 DIAGNOSIS — D62 Acute posthemorrhagic anemia: Secondary | ICD-10-CM | POA: Diagnosis not present

## 2018-05-04 DIAGNOSIS — R42 Dizziness and giddiness: Secondary | ICD-10-CM | POA: Diagnosis not present

## 2018-05-04 DIAGNOSIS — T39395A Adverse effect of other nonsteroidal anti-inflammatory drugs [NSAID], initial encounter: Secondary | ICD-10-CM | POA: Diagnosis present

## 2018-05-04 DIAGNOSIS — Z923 Personal history of irradiation: Secondary | ICD-10-CM

## 2018-05-04 DIAGNOSIS — I951 Orthostatic hypotension: Secondary | ICD-10-CM | POA: Diagnosis present

## 2018-05-04 DIAGNOSIS — R55 Syncope and collapse: Secondary | ICD-10-CM | POA: Diagnosis not present

## 2018-05-04 DIAGNOSIS — R1010 Upper abdominal pain, unspecified: Secondary | ICD-10-CM | POA: Diagnosis not present

## 2018-05-04 DIAGNOSIS — I2510 Atherosclerotic heart disease of native coronary artery without angina pectoris: Secondary | ICD-10-CM | POA: Diagnosis not present

## 2018-05-04 DIAGNOSIS — Z8249 Family history of ischemic heart disease and other diseases of the circulatory system: Secondary | ICD-10-CM

## 2018-05-04 DIAGNOSIS — D649 Anemia, unspecified: Secondary | ICD-10-CM

## 2018-05-04 DIAGNOSIS — I959 Hypotension, unspecified: Secondary | ICD-10-CM | POA: Diagnosis not present

## 2018-05-04 HISTORY — PX: ESOPHAGOGASTRODUODENOSCOPY (EGD) WITH PROPOFOL: SHX5813

## 2018-05-04 HISTORY — PX: SUBMUCOSAL INJECTION: SHX5543

## 2018-05-04 HISTORY — PX: HOT HEMOSTASIS: SHX5433

## 2018-05-04 HISTORY — DX: Gastrointestinal hemorrhage, unspecified: K92.2

## 2018-05-04 HISTORY — DX: Melena: K92.1

## 2018-05-04 LAB — HEPATIC FUNCTION PANEL
ALBUMIN: 2.8 g/dL — AB (ref 3.5–5.0)
ALT: 11 U/L (ref 0–44)
AST: 16 U/L (ref 15–41)
Alkaline Phosphatase: 28 U/L — ABNORMAL LOW (ref 38–126)
BILIRUBIN TOTAL: 0.8 mg/dL (ref 0.3–1.2)
Bilirubin, Direct: 0.2 mg/dL (ref 0.0–0.2)
Indirect Bilirubin: 0.6 mg/dL (ref 0.3–0.9)
Total Protein: 4.7 g/dL — ABNORMAL LOW (ref 6.5–8.1)

## 2018-05-04 LAB — BASIC METABOLIC PANEL
Anion gap: 10 (ref 5–15)
BUN: 66 mg/dL — AB (ref 8–23)
CALCIUM: 9.3 mg/dL (ref 8.9–10.3)
CHLORIDE: 111 mmol/L (ref 98–111)
CO2: 25 mmol/L (ref 22–32)
CREATININE: 1.24 mg/dL (ref 0.61–1.24)
GFR calc Af Amer: 58 mL/min — ABNORMAL LOW (ref >60)
GFR calc non Af Amer: 50 mL/min — ABNORMAL LOW (ref >60)
Glucose, Bld: 148 mg/dL — ABNORMAL HIGH (ref 70–99)
Potassium: 3.7 mmol/L (ref 3.5–5.1)
SODIUM: 146 mmol/L — AB (ref 135–145)

## 2018-05-04 LAB — URINALYSIS, ROUTINE W REFLEX MICROSCOPIC
Bilirubin Urine: NEGATIVE
Glucose, UA: NEGATIVE mg/dL
Hgb urine dipstick: NEGATIVE
Ketones, ur: NEGATIVE mg/dL
LEUKOCYTES UA: NEGATIVE
NITRITE: NEGATIVE
PH: 6 (ref 5.0–8.0)
Protein, ur: NEGATIVE mg/dL
Specific Gravity, Urine: 1.028 (ref 1.005–1.030)

## 2018-05-04 LAB — LIPASE, BLOOD: Lipase: 41 U/L (ref 11–51)

## 2018-05-04 LAB — CBC WITH DIFFERENTIAL/PLATELET
Abs Immature Granulocytes: 0.05 10*3/uL (ref 0.00–0.07)
Basophils Absolute: 0.1 10*3/uL (ref 0.0–0.1)
Basophils Relative: 1 %
Eosinophils Absolute: 0.1 10*3/uL (ref 0.0–0.5)
Eosinophils Relative: 1 %
HCT: 24.4 % — ABNORMAL LOW (ref 39.0–52.0)
HEMOGLOBIN: 7.6 g/dL — AB (ref 13.0–17.0)
IMMATURE GRANULOCYTES: 1 %
LYMPHS PCT: 8 %
Lymphs Abs: 0.7 10*3/uL (ref 0.7–4.0)
MCH: 29.2 pg (ref 26.0–34.0)
MCHC: 31.1 g/dL (ref 30.0–36.0)
MCV: 93.8 fL (ref 80.0–100.0)
Monocytes Absolute: 0.5 10*3/uL (ref 0.1–1.0)
Monocytes Relative: 6 %
NEUTROS ABS: 6.7 10*3/uL (ref 1.7–7.7)
Neutrophils Relative %: 83 %
Platelets: 193 10*3/uL (ref 150–400)
RBC: 2.6 MIL/uL — AB (ref 4.22–5.81)
RDW: 13.8 % (ref 11.5–15.5)
WBC: 8 10*3/uL (ref 4.0–10.5)
nRBC: 0 % (ref 0.0–0.2)

## 2018-05-04 LAB — CBG MONITORING, ED: Glucose-Capillary: 131 mg/dL — ABNORMAL HIGH (ref 70–99)

## 2018-05-04 LAB — PREPARE RBC (CROSSMATCH)

## 2018-05-04 LAB — POC OCCULT BLOOD, ED: Fecal Occult Bld: POSITIVE — AB

## 2018-05-04 LAB — HEMOGLOBIN AND HEMATOCRIT, BLOOD
HCT: 23.4 % — ABNORMAL LOW (ref 39.0–52.0)
Hemoglobin: 7.5 g/dL — ABNORMAL LOW (ref 13.0–17.0)

## 2018-05-04 LAB — MRSA PCR SCREENING: MRSA by PCR: NEGATIVE

## 2018-05-04 LAB — ABO/RH: ABO/RH(D): A POS

## 2018-05-04 SURGERY — ESOPHAGOGASTRODUODENOSCOPY (EGD) WITH PROPOFOL
Anesthesia: General

## 2018-05-04 MED ORDER — IOPAMIDOL (ISOVUE-300) INJECTION 61%
100.0000 mL | Freq: Once | INTRAVENOUS | Status: AC | PRN
  Administered 2018-05-04: 80 mL via INTRAVENOUS

## 2018-05-04 MED ORDER — ACETAMINOPHEN 325 MG PO TABS
325.0000 mg | ORAL_TABLET | Freq: Once | ORAL | Status: AC
  Administered 2018-05-04: 325 mg via ORAL
  Filled 2018-05-04: qty 1

## 2018-05-04 MED ORDER — SUCCINYLCHOLINE CHLORIDE 200 MG/10ML IV SOSY
PREFILLED_SYRINGE | INTRAVENOUS | Status: DC | PRN
  Administered 2018-05-04: 100 mg via INTRAVENOUS

## 2018-05-04 MED ORDER — SODIUM CHLORIDE 0.9 % IV SOLN
80.0000 mg | Freq: Once | INTRAVENOUS | Status: AC
  Administered 2018-05-04: 80 mg via INTRAVENOUS
  Filled 2018-05-04: qty 80

## 2018-05-04 MED ORDER — PHENYLEPHRINE 40 MCG/ML (10ML) SYRINGE FOR IV PUSH (FOR BLOOD PRESSURE SUPPORT)
PREFILLED_SYRINGE | INTRAVENOUS | Status: DC | PRN
  Administered 2018-05-04: 40 ug via INTRAVENOUS
  Administered 2018-05-04: 80 ug via INTRAVENOUS

## 2018-05-04 MED ORDER — HYDROMORPHONE HCL 1 MG/ML IJ SOLN
INTRAMUSCULAR | Status: AC
  Filled 2018-05-04: qty 1

## 2018-05-04 MED ORDER — EPINEPHRINE PF 1 MG/10ML IJ SOSY
PREFILLED_SYRINGE | INTRAMUSCULAR | Status: AC
  Filled 2018-05-04: qty 10

## 2018-05-04 MED ORDER — PROPOFOL 10 MG/ML IV BOLUS
INTRAVENOUS | Status: DC | PRN
  Administered 2018-05-04: 40 mg via INTRAVENOUS
  Administered 2018-05-04 (×2): 20 mg via INTRAVENOUS

## 2018-05-04 MED ORDER — SODIUM CHLORIDE 0.9 % IV SOLN
INTRAVENOUS | Status: DC | PRN
  Administered 2018-05-04: 20 ug/min via INTRAVENOUS

## 2018-05-04 MED ORDER — PROPOFOL 10 MG/ML IV BOLUS
INTRAVENOUS | Status: AC
  Filled 2018-05-04: qty 40

## 2018-05-04 MED ORDER — LACTATED RINGERS IV SOLN
INTRAVENOUS | Status: DC | PRN
  Administered 2018-05-04: 09:00:00 via INTRAVENOUS

## 2018-05-04 MED ORDER — SUCCINYLCHOLINE CHLORIDE 20 MG/ML IJ SOLN
INTRAMUSCULAR | Status: DC | PRN
  Administered 2018-05-04: 80 mg via INTRAVENOUS

## 2018-05-04 MED ORDER — SODIUM CHLORIDE 0.9 % IJ SOLN
PREFILLED_SYRINGE | INTRAMUSCULAR | Status: DC | PRN
  Administered 2018-05-04: 9.5 mL

## 2018-05-04 MED ORDER — PANTOPRAZOLE SODIUM 40 MG IV SOLR: 40.0000 mg | Freq: Two times a day (BID) | INTRAVENOUS | Status: DC

## 2018-05-04 MED ORDER — METOCLOPRAMIDE HCL 5 MG/ML IJ SOLN
10.0000 mg | Freq: Once | INTRAMUSCULAR | Status: AC
  Administered 2018-05-04: 10 mg via INTRAVENOUS

## 2018-05-04 MED ORDER — SODIUM CHLORIDE 0.9 % IV SOLN
8.0000 mg/h | INTRAVENOUS | Status: DC
  Administered 2018-05-04 – 2018-05-06 (×7): 8 mg/h via INTRAVENOUS
  Filled 2018-05-04 (×9): qty 80

## 2018-05-04 MED ORDER — HYDROMORPHONE HCL 1 MG/ML IJ SOLN
1.0000 mg | Freq: Once | INTRAMUSCULAR | Status: AC
  Administered 2018-05-04: 1 mg via INTRAVENOUS

## 2018-05-04 MED ORDER — IOPAMIDOL (ISOVUE-300) INJECTION 61%
INTRAVENOUS | Status: AC
  Filled 2018-05-04: qty 100

## 2018-05-04 MED ORDER — PROPOFOL 500 MG/50ML IV EMUL
INTRAVENOUS | Status: DC | PRN
  Administered 2018-05-04: 75 ug/kg/min via INTRAVENOUS

## 2018-05-04 MED ORDER — METOCLOPRAMIDE HCL 5 MG/ML IJ SOLN
INTRAMUSCULAR | Status: AC
  Filled 2018-05-04: qty 2

## 2018-05-04 MED ORDER — LIP MEDEX EX OINT
TOPICAL_OINTMENT | CUTANEOUS | Status: AC
  Administered 2018-05-04: 23:00:00
  Filled 2018-05-04: qty 7

## 2018-05-04 MED ORDER — SODIUM CHLORIDE 0.9 % IV SOLN
10.0000 mL/h | Freq: Once | INTRAVENOUS | Status: AC
  Administered 2018-05-04: 10 mL/h via INTRAVENOUS

## 2018-05-04 MED ORDER — SODIUM CHLORIDE 0.9 % IV BOLUS
1000.0000 mL | Freq: Once | INTRAVENOUS | Status: AC
  Administered 2018-05-04: 1000 mL via INTRAVENOUS

## 2018-05-04 SURGICAL SUPPLY — 14 items

## 2018-05-04 NOTE — ED Notes (Signed)
Pt. Unable to stand for the duration of the orthostatic vital signs, pt. Became dizzy and redirected back to bed with 2 assist.

## 2018-05-04 NOTE — H&P (Addendum)
History and Physical    Jesse [name] DJS:[id number] DOB: [birth year] DOA: 05/04/2018  PCP: [name], MD  Patient coming from: home  I have personally briefly reviewed patient's old medical records in [place]  Chief Complaint: abdominal pain, nausea  HPI: Jesse [name] is Jesse [name] 83 y.o. male with medical history significant of coronary disease status post CABG, AAA s/p repair, hypertension, hyperlipidemia, GERD presenting with several days of abdominal discomfort.   He notes his symptoms started around Sunday.  He describes feeling sick at that time with nausea and aching in his abdomen.  He describes the pain as achy in description and notes that his abdomen is distended as well.  He notes that the symptoms have progressively been getting worse.  He had one episode of emesis which looked like undigested food  (denies seeing any blood or coffee-ground emesis , he did describe the emesis as looking like coffee because that is what he just had to drink).  He notes Jesse [name] large dark bowel movement on Tuesday.  Last night he got up and got lightheadedness and felt like he almost went unconscious, because of this he called EMS.  He takes Sanyia Dini baby aspirin daily.  He took maybe 2 Alka-Seltzer over the past week.  He denies any other NSAIDS.  He denies any smoking or drinking.  He lives at home alone.  His son is his power of attorney.  ED Course: Labs, EKG, GI consult.  IVF, pRBC.  Hospitalist to admit for GI bleed.  Review of Systems: As per HPI otherwise 10 point review of systems negative.    Past Medical History:  Diagnosis Date  . AAA (abdominal aortic aneurysm) ([place])   . Arthritis   . Bronchitis    hx of appro 40 years ago  . Cancer ([place]) 2010   prostate ( 40 Txs. of radiation treatments)  . Coronary artery disease   . Environmental allergies   . GERD (gastroesophageal reflux disease)   . HOH (hard of hearing)    wears bilateral hearing aids  . Hyperlipidemia   . Hypertension   .  Kidney stone 1968  . Myocardial infarction ([place]) 1992  . Pneumonia    hx of  . RBBB     Past Surgical History:  Procedure Laterality Date  . ABDOMINAL AORTIC ANEURYSM REPAIR  08-13-1993   Dr Cameron Sprang  . CARDIAC CATHETERIZATION    . COLONOSCOPY W/ POLYPECTOMY    . CORONARY ARTERY BYPASS GRAFT  03-01-1991   Dr. Servando Snare  . EYE SURGERY     Detached retina (Left eye); bilateral cataract removal  . FALSE ANEURYSM REPAIR  05/07/2012   Procedure: REPAIR FALSE ANEURYSM;  Surgeon: Rosetta Posner, MD;  Location: [facility] OR;  Service: Vascular;  Laterality: Left;  Repair of Left Femoral Artery Aneurysm  . HERNIA REPAIR    . ORIF PATELLA Right 06/09/2015   Procedure: OPEN REDUCTION INTERNAL (ORIF) FIXATION PATELLA;  Surgeon: Marybelle Killings, MD;  Location: [facility];  Service: Orthopedics;  Laterality: Right;  . repair of bowel obstruction  1999   Dr. Dalbert Batman  . repair of ventral hernia  04-20-1994   P. Cameron Sprang MD  . resection femoral artery  03/19/2012   Dr. [name] Early     reports that he quit smoking about 49 years ago. His smoking use included cigarettes. He quit after 20.00 years of use. He has never used smokeless tobacco. He reports that he does not drink alcohol or  use drugs.  Allergies  Allergen Reactions  . Lactose Intolerance (Gi) Other (See Comments)    Gas, bloating, stomach indigestion   . Adhesive [Tape] Itching and Rash    Family History  Problem Relation Age of Onset  . Cancer Mother   . Cancer Father   . Varicose Veins Father   . Cancer Sister   . Diabetes Sister   . Heart attack Sister    Prior to Admission medications   Medication Sig Start Date End Date Taking? Authorizing Provider  acetaminophen (TYLENOL) 325 MG tablet Take 650 mg by mouth every 6 (six) hours as needed for mild pain.   Yes [provider]  Alum Hydroxide-Mag Carbonate (GAVISCON PO) Take 1 tablet by mouth daily as needed (upset stomach).    Yes [provider]  aspirin 81 MG  tablet Take 81 mg by mouth daily.   Yes [provider]  carboxymethylcellulose (REFRESH PLUS) 0.5 % SOLN Apply 1 drop to eye 3 (three) times daily as needed (dry eyes).    Yes [provider]  carvedilol (COREG) 12.5 MG tablet TAKE 1 TABLET BY MOUTH 2 TIMES DAILY WITH [name] MEAL Patient taking differently: Take 12.5 mg by mouth 2 (two) times daily with [name] meal.  02/12/18  Yes [name], MD  fenofibrate 160 MG tablet Take 1 tablet (160 mg total) by mouth daily. 06/20/17  Yes [name], MD  hydrochlorothiazide (HYDRODIURIL) 12.5 MG tablet TAKE 1 TABLET BY MOUTH DAILY GENERIC EQUIVALENT FOR HYDRODIURIL Patient taking differently: Take 12.5 mg by mouth daily.  04/25/18  Yes [name], MD  Lactase (DAIRY DIGESTIVE SUPPLEMENT PO) Take 1 tablet by mouth daily as needed (when eating foods containing lactose).    Yes [provider]  loratadine (CLARITIN) 10 MG tablet Take 10 mg by mouth daily.    Yes [provider]  losartan (COZAAR) 50 MG tablet TAKE 1 TABLET BY MOUTH DAILY GENERIC EQUIVALENT FOR COZAAR Patient taking differently: Take 50 mg by mouth daily.  04/25/18  Yes [name], MD  Probiotic Product (PROBIOTIC PO) Take 1 capsule by mouth daily.   Yes [provider]  simvastatin (ZOCOR) 40 MG tablet TAKE 1 TABLET BY MOUTH DAILY AT 6PM. Please keep upcoming appt in October for future refills. Thank you 04/03/18  Yes Burt Knack, [name], MD  omeprazole (PRILOSEC) 20 MG capsule Take 1 capsule (20 mg total) by mouth daily. 05/28/15   Palumbo, April, MD    Physical Exam: Vitals:   05/04/18 0717 05/04/18 0730 05/04/18 0745 05/04/18 0800  BP:  102/81 127/68 119/74  Pulse:  74 77 79  Resp:  20 (!) 24 18  Temp: 98 F (36.7 C)     TempSrc: Oral     SpO2:  99% 100% 100%    Constitutional: NAD, calm, comfortable Vitals:   05/04/18 0717 05/04/18 0730 05/04/18 0745 05/04/18 0800  BP:  102/81 127/68 119/74  Pulse:  74 77 79  Resp:  20 (!) 24 18   Temp: 98 F (36.7 C)     TempSrc: Oral     SpO2:  99% 100% 100%   Eyes: PERRL, lids and conjunctivae normal ENMT: Mucous membranes are moist. Posterior pharynx clear of any exudate or lesions.Normal dentition.  Neck: normal, supple, no masses, no thyromegaly Respiratory: clear to auscultation bilaterally, no wheezing, no crackles. Normal respiratory effort. No accessory muscle use.  Cardiovascular: Regular rate and rhythm, no murmurs / rubs / gallops. No extremity edema. 2+ pedal pulses.  No carotid bruits.  Abdomen: soft, distended, tender to palpation without rebound or guarding, most in LLQ Rectal: gross melena on exam, pt had melanotic bowel movement prior to me getting to bedside Musculoskeletal: no clubbing / cyanosis. No joint deformity upper and lower extremities. Good ROM, no contractures. Normal muscle tone.  Skin: no rashes, lesions, ulcers. No induration Neurologic: CN 2-12 grossly intact. Sensation intact. Strength 5/5 in all 4.  Psychiatric: Normal judgment and insight. Alert and oriented x 3. Normal mood.   Labs on Admission: I have personally reviewed following labs and imaging studies  CBC: Recent Labs  Lab 05/04/18 0507  WBC 8.0  NEUTROABS 6.7  HGB 7.6*  HCT 24.4*  MCV 93.8  PLT 366   Basic Metabolic Panel: Recent Labs  Lab 05/04/18 0506  NA 146*  K 3.7  CL 111  CO2 25  GLUCOSE 148*  BUN 66*  CREATININE 1.24  CALCIUM 9.3   GFR: CrCl cannot be calculated (Unknown ideal weight.). Liver Function Tests: No results for input(s): AST, ALT, ALKPHOS, BILITOT, PROT, ALBUMIN in the last 168 hours. Recent Labs  Lab 05/04/18 0506  LIPASE 41   No results for input(s): AMMONIA in the last 168 hours. Coagulation Profile: No results for input(s): INR, PROTIME in the last 168 hours. Cardiac Enzymes: No results for input(s): CKTOTAL, CKMB, CKMBINDEX, TROPONINI in the last 168 hours. BNP (last 3 results) No results for input(s): PROBNP in the last 8760  hours. HbA1C: No results for input(s): HGBA1C in the last 72 hours. CBG: Recent Labs  Lab 05/04/18 0447  GLUCAP 131*   Lipid Profile: No results for input(s): CHOL, HDL, LDLCALC, TRIG, CHOLHDL, LDLDIRECT in the last 72 hours. Thyroid Function Tests: No results for input(s): TSH, T4TOTAL, FREET4, T3FREE, THYROIDAB in the last 72 hours. Anemia Panel: No results for input(s): VITAMINB12, FOLATE, FERRITIN, TIBC, IRON, RETICCTPCT in the last 72 hours. Urine analysis:    Component Value Date/Time   COLORURINE YELLOW 01/01/2016 1242   APPEARANCEUR CLEAR 01/01/2016 1242   LABSPEC 1.011 01/01/2016 1242   PHURINE 7.5 01/01/2016 1242   GLUCOSEU NEGATIVE 01/01/2016 1242   HGBUR NEGATIVE 01/01/2016 1242   BILIRUBINUR NEGATIVE 01/01/2016 1242   KETONESUR NEGATIVE 01/01/2016 1242   PROTEINUR NEGATIVE 01/01/2016 1242   UROBILINOGEN 2.0 (H) 05/28/2015 0029   NITRITE NEGATIVE 01/01/2016 1242   LEUKOCYTESUR NEGATIVE 01/01/2016 1242    Radiological Exams on Admission: No results found.  EKG: Independently reviewed. Normal sinus rhythm, LAD, RBBB.  Prolonged QTc.  T wave inversions in V2-V3.  New Q waves in V3-V5.  Assessment/Plan Principal Problem:   Symptomatic anemia Active Problems:   Orthostatic hypotension   Melena   GI bleed  GI bleed  Abdominal Discomfort: pt with gross melena on exam.  Has had several days of abdominal discomfort associated with nausea.  1 episode of emesis without any notable blood (he states he threw up coffee, but had just had coffee with breakfast, he doesn't describe coffee ground emesis).  He had 1 large dark BM on Tuesday.  Takes baby ASA daily, but denies any NSAIDs or other OTC meds other than alka seltzer which he took it sounds like twice over the past week.  Suspect upper GI bleed in setting of elevated BUN and melena on exam. Hb today 7.6, down from 12.9 in 2017 2 PIV in place 2 units pRBC ordered q6 H/H IV PPI GI consult, discussed with Dr.  Therisa Doyne, pt has endoscopy scheduled for this AM Given abdominal  discomfort on exam, will follow CT abdomen/pelvis  Hypotension: 2/2 above, it's improved with IVF and pRBC's, most recently in 458'K systolics. Continue to hold home BP meds Follow serial H/H and transfuse as appropriate  Acute Kidney Injury: likely 2/2 hypovolemia  CAD s/p CABG: holding ASA, coreg, losartan  Hypertension: holding coreg and losartan due to recent hypotension as noted above  Hx AAA s/p repair  Thoracic Aortic Aneurysm: follows with CT surgery  HLD: hold simvastatin  HFrEF: EF 99-83%, grade 1 diastolic dysfunction in 3825 echo, pt has done well with IVF and pRBC here, but follow volume status closely  Abnormal EKG: pt with new q waves in V3-V5 as well as T wave inversions in V2-V3. Pt is CP free. Follow repeat EKG in AM.    Addendum: CT abdomen pelvis with internal iliac artery aneurysms bilaterally, R>L.  Retrograde filling noted to be more prominent on R than previous study and rads recommended discussing with vascular.  Discussed imaging with Dr. Donzetta Matters from vascular who noted this can be followed up with vascular as outpatient.  DVT prophylaxis: SCD Code Status: DNR - discussed with patient on admission Family Communication: called son and daughter, but no answer - son is POA Disposition Plan: pending improvement Consults called: GI, discussed with Dr. Therisa Doyne  Admission status: inpatient given acute GI bleed with hemodynamic instability, he will need greater than Canisha Issac 2 midnight stay given risk for decompensation    Fayrene Helper MD [facility] Pager [phone]  If 7PM-7AM, please contact night-coverage [email or web address] Password [facility]  05/04/2018, 8:23 AM

## 2018-05-04 NOTE — ED Notes (Signed)
Bed: EQ73 Expected date:  Expected time:  Means of arrival:  Comments: 65M GI issues

## 2018-05-04 NOTE — ED Notes (Signed)
EKG given to EDP,Molpus,MD., for review. 

## 2018-05-04 NOTE — Brief Op Note (Signed)
05/04/2018  10:08 AM  PATIENT:  [name]  84 y.o. male  PRE-OPERATIVE DIAGNOSIS:  GIB  POST-OPERATIVE DIAGNOSIS:  duodenal ulcer s/p epi and bicap  PROCEDURE:  Procedure(s): ESOPHAGOGASTRODUODENOSCOPY (EGD) WITH PROPOFOL (N/A) HOT HEMOSTASIS (ARGON PLASMA COAGULATION/BICAP) (N/A) injection of epinephrine dodenum  SURGEON:  Surgeon(s) and Role:    [name], MD - Primary  PHYSICIAN ASSISTANT:   ASSISTANTS: Burtis Junes, RN, Guillama Awake, Tech  ANESTHESIA:   MAC  EBL:  Minimal  BLOOD ADMINISTERED:none  DRAINS: none   LOCAL MEDICATIONS USED:  NONE  SPECIMEN:  Biopsy / Limited Resection  DISPOSITION OF SPECIMEN:  N/A  COUNTS:  YES  TOURNIQUET:  * No tourniquets in log *  DICTATION: .Dragon Dictation  PLAN OF CARE: Admit to inpatient   PATIENT DISPOSITION:  PACU - hemodynamically stable.   Delay start of Pharmacological VTE agent (>24hrs) due to surgical blood loss or risk of bleeding: yes

## 2018-05-04 NOTE — ED Notes (Signed)
Patient in CT

## 2018-05-04 NOTE — ED Triage Notes (Signed)
Patient here from home with complaints of hypotension that started this morning when he was unable to sleep. Felt weak with standing. States that he has been having GI issues since this last week, but that resolved after having large bowel movement.

## 2018-05-04 NOTE — Anesthesia Procedure Notes (Signed)
Procedure Name: Intubation Date/Time: 05/04/2018 9:31 AM Performed by: Victoriano Lain, CRNA Pre-anesthesia Checklist: Patient identified, Emergency Drugs available, Suction available, Patient being monitored and Timeout performed Patient Re-evaluated:Patient Re-evaluated prior to induction Oxygen Delivery Method: Circle system utilized Preoxygenation: Pre-oxygenation with 100% oxygen Induction Type: IV induction, Rapid sequence and Cricoid Pressure applied Laryngoscope Size: Mac and 4 Grade View: Grade I Tube type: Oral Tube size: 7.5 mm Number of attempts: 1 Airway Equipment and Method: Stylet Placement Confirmation: ETT inserted through vocal cords under direct vision,  positive ETCO2 and breath sounds checked- equal and bilateral Secured at: 22 cm Tube secured with: Tape Dental Injury: Teeth and Oropharynx as per pre-operative assessment  Comments: RSI with cricoid pressure by Dr. Ermalene Postin. Grade 1 view. ATOI.

## 2018-05-04 NOTE — Consult Note (Signed)
[facility] Consult  Referring Provider: Shanon Rosser, MD/ER Primary Care Physician:  [name], MD Primary Gastroenterologist: Dr.Johnson Hassell Done  Reason for Consultation:  Anemia and black stools  HPI: Jesse [name] is a 84 y.o. male presented to the ER with periumbilical abdominal pain for several weeks, but worse for the past 4 days. He noted black stools 4 days ago, reports not paying attention to the color of his stool thereafter. In the ER he had 3 Bms described as dark, black and bloody slime, with very little of stool.He has had vomiting of clear fluid a few days ago but denies vomiting blood or coffee ground material. He is usually constipated. Denies recent unintentional weight loss or loss of appetite. He takes ASA 81 mg daily and denies use of NSAIDs. 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 When he was evaluated by EMS he was hypotensive and orthostatic prior to arrival to ER. He is receiving is first of 2 units of PRBC transfusion. He had a CT of abdomen done, results are pending.    Past Medical History:  Diagnosis Date  . AAA (abdominal aortic aneurysm) ([place])   . Arthritis   . Bronchitis    hx of appro 40 years ago  . Cancer ([place]) 2010   prostate ( 40 Txs. of radiation treatments)  . Coronary artery disease   . Environmental allergies   . GERD (gastroesophageal reflux disease)   . HOH (hard of hearing)    wears bilateral hearing aids  . Hyperlipidemia   . Hypertension   . Kidney stone 1968  . Myocardial infarction ([place]) 1992  . Pneumonia    hx of  . RBBB     Past Surgical History:  Procedure Laterality Date  . ABDOMINAL AORTIC ANEURYSM REPAIR  08-13-1993   Dr Cameron Sprang  . CARDIAC CATHETERIZATION    . COLONOSCOPY W/ POLYPECTOMY    . CORONARY ARTERY BYPASS GRAFT  03-01-1991   Dr. Servando Snare  . EYE SURGERY     Detached retina (Left eye); bilateral cataract removal  . FALSE ANEURYSM REPAIR  05/07/2012    Procedure: REPAIR FALSE ANEURYSM;  Surgeon: Rosetta Posner, MD;  Location: [facility] OR;  Service: Vascular;  Laterality: Left;  Repair of Left Femoral Artery Aneurysm  . HERNIA REPAIR    . ORIF PATELLA Right 06/09/2015   Procedure: OPEN REDUCTION INTERNAL (ORIF) FIXATION PATELLA;  Surgeon: Marybelle Killings, MD;  Location: [facility];  Service: Orthopedics;  Laterality: Right;  . repair of bowel obstruction  1999   Dr. Dalbert Batman  . repair of ventral hernia  04-20-1994   P. Cameron Sprang MD  . resection femoral artery  03/19/2012   Dr. [name] Early    Prior to Admission medications   Medication Sig Start Date End Date Taking? Authorizing Provider  acetaminophen (TYLENOL) 325 MG tablet Take 650 mg by mouth every 6 (six) hours as needed for mild pain.   Yes [provider]  Alum Hydroxide-Mag Carbonate (GAVISCON PO) Take 1 tablet by mouth daily as needed (upset stomach).    Yes [provider]  aspirin 81 MG tablet Take 81 mg by mouth daily.   Yes [provider]  carboxymethylcellulose (REFRESH PLUS) 0.5 % SOLN Apply 1 drop to eye 3 (three) times daily as needed (dry eyes).    Yes [provider]  carvedilol (COREG) 12.5 MG tablet TAKE 1 TABLET BY MOUTH 2 TIMES DAILY WITH A MEAL Patient taking differently:  Take 12.5 mg by mouth 2 (two) times daily with a meal.  02/12/18  Yes [name], MD  fenofibrate 160 MG tablet Take 1 tablet (160 mg total) by mouth daily. 06/20/17  Yes [name], MD  hydrochlorothiazide (HYDRODIURIL) 12.5 MG tablet TAKE 1 TABLET BY MOUTH DAILY GENERIC EQUIVALENT FOR HYDRODIURIL Patient taking differently: Take 12.5 mg by mouth daily.  04/25/18  Yes [name], MD  Lactase (DAIRY DIGESTIVE SUPPLEMENT PO) Take 1 tablet by mouth daily as needed (when eating foods containing lactose).    Yes [provider]  loratadine (CLARITIN) 10 MG tablet Take 10 mg by mouth daily.    Yes [provider]  losartan (COZAAR) 50 MG tablet TAKE 1  TABLET BY MOUTH DAILY GENERIC EQUIVALENT FOR COZAAR Patient taking differently: Take 50 mg by mouth daily.  04/25/18  Yes [name], MD  Probiotic Product (PROBIOTIC PO) Take 1 capsule by mouth daily.   Yes [provider]  simvastatin (ZOCOR) 40 MG tablet TAKE 1 TABLET BY MOUTH DAILY AT 6PM. Please keep upcoming appt in October for future refills. Thank you 04/03/18  Yes Burt Knack, [name], MD  omeprazole (PRILOSEC) 20 MG capsule Take 1 capsule (20 mg total) by mouth daily. 05/28/15   Palumbo, April, MD    Current Facility-Administered Medications  Medication Dose Route Frequency Provider Last Rate Last Dose  . 0.9 %  sodium chloride infusion  10 mL/hr Intravenous Once Molpus, John, MD      . iopamidol (ISOVUE-300) 61 % injection           . pantoprazole (PROTONIX) 80 mg in sodium chloride 0.9 % 250 mL (0.32 mg/mL) infusion  8 mg/hr Intravenous Continuous Opyd, Ilene Qua, MD 25 mL/hr at 05/04/18 0639 8 mg/hr at 05/04/18 0639  . [START ON 05/07/2018] pantoprazole (PROTONIX) injection 40 mg  40 mg Intravenous Q12H Opyd, Ilene Qua, MD       Current Outpatient Medications  Medication Sig Dispense Refill  . acetaminophen (TYLENOL) 325 MG tablet Take 650 mg by mouth every 6 (six) hours as needed for mild pain.    Marland Kitchen Alum Hydroxide-Mag Carbonate (GAVISCON PO) Take 1 tablet by mouth daily as needed (upset stomach).     Marland Kitchen aspirin 81 MG tablet Take 81 mg by mouth daily.    . carboxymethylcellulose (REFRESH PLUS) 0.5 % SOLN Apply 1 drop to eye 3 (three) times daily as needed (dry eyes).     . carvedilol (COREG) 12.5 MG tablet TAKE 1 TABLET BY MOUTH 2 TIMES DAILY WITH A MEAL (Patient taking differently: Take 12.5 mg by mouth 2 (two) times daily with a meal. ) 180 tablet 0  . fenofibrate 160 MG tablet Take 1 tablet (160 mg total) by mouth daily. 90 tablet 3  . hydrochlorothiazide (HYDRODIURIL) 12.5 MG tablet TAKE 1 TABLET BY MOUTH DAILY GENERIC EQUIVALENT FOR HYDRODIURIL (Patient taking  differently: Take 12.5 mg by mouth daily. ) 90 tablet 0  . Lactase (DAIRY DIGESTIVE SUPPLEMENT PO) Take 1 tablet by mouth daily as needed (when eating foods containing lactose).     Marland Kitchen loratadine (CLARITIN) 10 MG tablet Take 10 mg by mouth daily.     Marland Kitchen losartan (COZAAR) 50 MG tablet TAKE 1 TABLET BY MOUTH DAILY GENERIC EQUIVALENT FOR COZAAR (Patient taking differently: Take 50 mg by mouth daily. ) 90 tablet 0  . Probiotic Product (PROBIOTIC PO) Take 1 capsule by mouth daily.    . simvastatin (ZOCOR) 40 MG tablet TAKE 1 TABLET BY MOUTH  DAILY AT 6PM. Please keep upcoming appt in October for future refills. Thank you 90 tablet 0  . omeprazole (PRILOSEC) 20 MG capsule Take 1 capsule (20 mg total) by mouth daily. 30 capsule 0    Allergies as of 05/04/2018 - Review Complete 05/04/2018  Allergen Reaction Noted  . Lactose intolerance (gi) Other (See Comments) 06/01/2015  . Adhesive [tape] Itching and Rash 07/17/2013    Family History  Problem Relation Age of Onset  . Cancer Mother   . Cancer Father   . Varicose Veins Father   . Cancer Sister   . Diabetes Sister   . Heart attack Sister     Social History   Socioeconomic History  . Marital status: Widowed    Spouse name: Not on file  . Number of children: Not on file  . Years of education: Not on file  . Highest education level: Not on file  Occupational History  . Not on file  Social Needs  . Financial resource strain: Not on file  . Food insecurity:    Worry: Not on file    Inability: Not on file  . Transportation needs:    Medical: Not on file    Non-medical: Not on file  Tobacco Use  . Smoking status: Former Smoker    Years: 20.00    Types: Cigarettes    Last attempt to quit: 07/25/1968    Years since quitting: 49.8  . Smokeless tobacco: Never Used  Substance and Sexual Activity  . Alcohol use: No  . Drug use: No  . Sexual activity: Not on file  Lifestyle  . Physical activity:    Days per week: Not on file    Minutes  per session: Not on file  . Stress: Not on file  Relationships  . Social connections:    Talks on phone: Not on file    Gets together: Not on file    Attends religious service: Not on file    Active member of club or organization: Not on file    Attends meetings of clubs or organizations: Not on file    Relationship status: Not on file  . Intimate partner violence:    Fear of current or ex partner: Not on file    Emotionally abused: Not on file    Physically abused: Not on file    Forced sexual activity: Not on file  Other Topics Concern  . Not on file  Social History Narrative  . Not on file    Review of Systems: Positive for: GI: Described in detail in HPI.    Gen: Denies any fever, chills, rigors, night sweats, anorexia, fatigue, weakness, malaise, involuntary weight loss, and sleep disorder CV: Denies chest pain, angina, palpitations, syncope, orthopnea, PND, peripheral edema, and claudication. Resp: Denies dyspnea, cough, sputum, wheezing, coughing up blood. GU : Denies urinary burning, blood in urine, urinary frequency, urinary hesitancy, nocturnal urination, and urinary incontinence. MS: Denies joint pain or swelling.  Denies muscle weakness, cramps, atrophy.  Derm: Denies rash, itching, oral ulcerations, hives, unhealing ulcers.  Psych: Denies depression, anxiety, memory loss, suicidal ideation, hallucinations,  and confusion. Heme: rectal bleeding,Denies bruising, and enlarged lymph nodes. Neuro:  Denies any headaches, dizziness, paresthesias. Endo:  Denies any problems with DM, thyroid, adrenal function.  Physical Exam: Vital signs in last 24 hours: Temp:  [97.7 F (36.5 C)-98.9 F (37.2 C)] 98 F (36.7 C) (10/11 0717) Pulse Rate:  [70-79] 78 (10/11 0838) Resp:  [12-24] 18 (10/11 0800)  BP: (69-127)/(47-81) 119/74 (10/11 0838) SpO2:  [97 %-100 %] 100 % (10/11 0800)    General:   Alert,  Well-developed, well-nourished, pleasant and cooperative in NAD Head:   Normocephalic and atraumatic. Eyes:  Sclera clear, no icterus.   Prominent pallor Ears: hard of hearing Nose:  No deformity, discharge,  or lesions. Mouth:  No deformity or lesions.  Oropharynx pink & moist. Neck:  Supple; no masses or thyromegaly. Lungs:  Clear throughout to auscultation.   No wheezes, crackles, or rhonchi. No acute distress. Heart:  Regular rate and rhythm; no murmurs, clicks, rubs,  or gallops. Extremities:  Without clubbing or edema. Neurologic:  Alert and  oriented x4;  grossly normal neurologically. Skin:  Intact without significant lesions or rashes. Psych:  Alert and cooperative. Normal mood and affect. Abdomen:  Soft,periumbilical tenderness and nondistended. No masses, hepatosplenomegaly or hernias noted. Normal bowel sounds, without guarding, and without rebound.     Rectal exam by ER physician showed grossly melanotic stool oozing from rectum, as per patient's hospitalist stool was black     Lab Results: Recent Labs    05/04/18 0507  WBC 8.0  HGB 7.6*  HCT 24.4*  PLT 193   BMET Recent Labs    05/04/18 0506  NA 146*  K 3.7  CL 111  CO2 25  GLUCOSE 148*  BUN 66*  CREATININE 1.24  CALCIUM 9.3   LFT No results for input(s): PROT, ALBUMIN, AST, ALT, ALKPHOS, BILITOT, BILIDIR, IBILI in the last 72 hours. PT/INR No results for input(s): LABPROT, INR in the last 72 hours.  Studies/Results: No results found.  Impression: Melena, elevated Bun/Cr ratio, hypotensive on presentation compatible with UGI bleeding CT abdomen pending  Plan: EGD now, has received protonix 80 mg IV and continued on protonix drip at 8mg /hr To receive 2 units of PRBC transfusion The risks and benefits and alternatives were explained to the patient in details. He verbalizes understanding and consents for the procedure.    LOS: 0 days   [name], MD  05/04/2018, 8:47 AM  Pager [phone] If no answer or after 5 PM call [phone]

## 2018-05-04 NOTE — Op Note (Signed)
EGD showed  Coffee ground fluid in esophagus, widely patent Schatzki's ring, 2 cm hiatal hernia. More than 1300 ml of coffee ground fluid, old blood and clots were removed from the gastric cavity.  A cratered 12 mm ulcer with a visible vessel and oozing was noted in the duodenal bulb. This was treated with 9.5 ml of 1:10,000 epinephrine and cauterized with bipolar cautery at 20 watts successfully.  Recommendations: Keep NPO for today. Protonix drip for at least another 24 hours and then BID for at least 2 months. Avoid NSAIDs, H and H monitoring and transfusion as needed.   [name], MD

## 2018-05-04 NOTE — Transfer of Care (Signed)
Immediate Anesthesia Transfer of Care Note  Patient: Jesse [name]  Procedure(s) Performed: ESOPHAGOGASTRODUODENOSCOPY (EGD) WITH PROPOFOL (N/A ) HOT HEMOSTASIS (ARGON PLASMA COAGULATION/BICAP) (N/A ) injection of epinephrine dodenum  Patient Location: PACU and Endoscopy Unit  Anesthesia Type:General  Level of Consciousness: awake, alert , oriented and patient cooperative  Airway & Oxygen Therapy: Patient Spontanous Breathing and Patient connected to face mask oxygen  Post-op Assessment: Report given to RN, Post -op Vital signs reviewed and stable and Patient moving all extremities  Post vital signs: Reviewed and stable  Last Vitals:  Vitals Value Taken Time  BP    Temp    Pulse 71 05/04/2018 10:09 AM  Resp 14 05/04/2018 10:09 AM  SpO2 100 % 05/04/2018 10:09 AM  Vitals shown include unvalidated device data.  Last Pain:  Vitals:   05/04/18 0838  TempSrc: Oral  PainSc: 0-No pain         Complications: No apparent anesthesia complications

## 2018-05-04 NOTE — ED Notes (Signed)
hospitalist at bedside states he will order CT of abdomen, will notify ICU of delay in transport.

## 2018-05-04 NOTE — ED Provider Notes (Signed)
[facility] DEPT Provider Note: Georgena Spurling, MD, FACEP  CSN: [id number] MRN: [id number] ARRIVAL: 05/04/18 at [place]: [place]  Weakness   HISTORY OF PRESENT ILLNESS  05/04/18 4:46 AM Jesse [name] is a 84 y.o. male who began feeling weak this morning when standing.  He has been unable to sleep.  He had been constipated for several days but had a large bowel movement 3 days ago.  He has not had a bowel movement since.  EMS noted him to be hypotensive and orthostatic prior to arrival.  They also noted him to be pale.   The patient complains of generalized abdominal pain which is not severe.  He also states his abdomen feels more distended than usual.  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  He has been short of breath, worse with exertion but denies chest pain.   Past Medical History:  Diagnosis Date  . AAA (abdominal aortic aneurysm) ([place])   . Arthritis   . Bronchitis    hx of appro 40 years ago  . Cancer ([place]) 2010   prostate ( 40 Txs. of radiation treatments)  . Coronary artery disease   . Environmental allergies   . GERD (gastroesophageal reflux disease)   . HOH (hard of hearing)    wears bilateral hearing aids  . Hyperlipidemia   . Hypertension   . Kidney stone 1968  . Myocardial infarction ([place]) 1992  . Pneumonia    hx of  . RBBB     Past Surgical History:  Procedure Laterality Date  . ABDOMINAL AORTIC ANEURYSM REPAIR  08-13-1993   Dr Cameron Sprang  . CARDIAC CATHETERIZATION    . COLONOSCOPY W/ POLYPECTOMY    . CORONARY ARTERY BYPASS GRAFT  03-01-1991   Dr. Servando Snare  . EYE SURGERY     Detached retina (Left eye); bilateral cataract removal  . FALSE ANEURYSM REPAIR  05/07/2012   Procedure: REPAIR FALSE ANEURYSM;  Surgeon: Rosetta Posner, MD;  Location: [facility] OR;  Service: Vascular;  Laterality: Left;  Repair of Left Femoral Artery Aneurysm  . HERNIA REPAIR    . ORIF PATELLA Right  06/09/2015   Procedure: OPEN REDUCTION INTERNAL (ORIF) FIXATION PATELLA;  Surgeon: Marybelle Killings, MD;  Location: [facility];  Service: Orthopedics;  Laterality: Right;  . repair of bowel obstruction  1999   Dr. Dalbert Batman  . repair of ventral hernia  04-20-1994   P. Cameron Sprang MD  . resection femoral artery  03/19/2012   Dr. [name] Early    Family History  Problem Relation Age of Onset  . Cancer Mother   . Cancer Father   . Varicose Veins Father   . Cancer Sister   . Diabetes Sister   . Heart attack Sister     Social History   Tobacco Use  . Smoking status: Former Smoker    Years: 20.00    Types: Cigarettes    Last attempt to quit: 07/25/1968    Years since quitting: 49.8  . Smokeless tobacco: Never Used  Substance Use Topics  . Alcohol use: No  . Drug use: No    Prior to Admission medications   Medication Sig Start Date End Date Taking? Authorizing Provider  acetaminophen (TYLENOL) 325 MG tablet Take 650 mg by mouth every 6 (six) hours as needed for mild pain.   Yes [provider]  Alum Hydroxide-Mag Carbonate (GAVISCON PO) Take 1 tablet by  mouth daily as needed (upset stomach).    Yes [provider]  aspirin 81 MG tablet Take 81 mg by mouth daily.   Yes [provider]  carboxymethylcellulose (REFRESH PLUS) 0.5 % SOLN Apply 1 drop to eye 3 (three) times daily as needed (dry eyes).    Yes [provider]  carvedilol (COREG) 12.5 MG tablet TAKE 1 TABLET BY MOUTH 2 TIMES DAILY WITH A MEAL Patient taking differently: Take 12.5 mg by mouth 2 (two) times daily with a meal.  02/12/18  Yes [name], MD  fenofibrate 160 MG tablet Take 1 tablet (160 mg total) by mouth daily. 06/20/17  Yes [name], MD  hydrochlorothiazide (HYDRODIURIL) 12.5 MG tablet TAKE 1 TABLET BY MOUTH DAILY GENERIC EQUIVALENT FOR HYDRODIURIL Patient taking differently: Take 12.5 mg by mouth daily.  04/25/18  Yes [name], MD  Lactase (DAIRY DIGESTIVE SUPPLEMENT  PO) Take 1 tablet by mouth daily as needed (when eating foods containing lactose).    Yes [provider]  loratadine (CLARITIN) 10 MG tablet Take 10 mg by mouth daily.    Yes [provider]  losartan (COZAAR) 50 MG tablet TAKE 1 TABLET BY MOUTH DAILY GENERIC EQUIVALENT FOR COZAAR Patient taking differently: Take 50 mg by mouth daily.  04/25/18  Yes [name], MD  Probiotic Product (PROBIOTIC PO) Take 1 capsule by mouth daily.   Yes [provider]  simvastatin (ZOCOR) 40 MG tablet TAKE 1 TABLET BY MOUTH DAILY AT 6PM. Please keep upcoming appt in October for future refills. Thank you 04/03/18  Yes [name], MD  dicyclomine (BENTYL) 20 MG tablet Take 1 tablet (20 mg total) by mouth 2 (two) times daily. Patient not taking: Reported on 05/04/2018 05/28/15   Palumbo, April, MD  omeprazole (PRILOSEC) 20 MG capsule Take 1 capsule (20 mg total) by mouth daily. 05/28/15   Palumbo, April, MD    Allergies Lactose intolerance (gi) and Adhesive [tape]   REVIEW OF SYSTEMS  Negative except as noted here or in the History of Present Illness.   PHYSICAL EXAMINATION  Initial Vital Signs Blood pressure (!) 82/56, pulse 73, temperature 97.7 F (36.5 C), temperature source Oral, resp. rate 18, SpO2 97 %.  Examination General: Well-developed, well-nourished male in no acute distress; appearance consistent with age of record HENT: normocephalic; atraumatic Eyes: pupils equal, round and reactive to light; extraocular muscles intact; arcus senilis bilaterally; pale conjunctivae Neck: supple Heart: regular rate and rhythm Lungs: clear to auscultation bilaterally Abdomen: soft; distended; mild diffuse tenderness; bowel sounds present Rectal: Grossly melanotic stool oozing from rectum Extremities: No deformity; full range of motion; pulses mildly decreased Neurologic: Awake, alert and oriented; motor function intact in all extremities and symmetric; no facial droop Skin:  Warm and dry; pale Psychiatric: Normal mood and affect   RESULTS  Summary of this visit's results, reviewed by myself:   EKG Interpretation  Date/Time:  Friday May 04 2018 04:47:04 EDT Ventricular Rate:  73 PR Interval:    QRS Duration: 154 QT Interval:  463 QTC Calculation: 511 R Axis:   -74 Text Interpretation:  Sinus rhythm IVCD, consider atypical RBBB LVH with IVCD, LAD and secondary repol abnrm Inferior infarct, old Anterolateral infarct, age indeterminate Prolonged QT interval PACs seen previously Confirmed by Shanon Rosser [phone]) on 05/04/2018 4:57:17 AM      Laboratory Studies: Results for orders placed or performed during the hospital encounter of 05/04/18 (from the past 24 hour(s))  CBG monitoring, ED  Status: Abnormal   Collection Time: 05/04/18  4:47 AM  Result Value Ref Range   Glucose-Capillary 131 (H) 70 - 99 mg/dL   Comment 1 Notify RN   POC occult blood, ED Provider will collect     Status: Abnormal   Collection Time: 05/04/18  4:53 AM  Result Value Ref Range   Fecal Occult Bld POSITIVE (A) NEGATIVE  Basic metabolic panel     Status: Abnormal   Collection Time: 05/04/18  5:06 AM  Result Value Ref Range   Sodium 146 (H) 135 - 145 mmol/L   Potassium 3.7 3.5 - 5.1 mmol/L   Chloride 111 98 - 111 mmol/L   CO2 25 22 - 32 mmol/L   Glucose, Bld 148 (H) 70 - 99 mg/dL   BUN 66 (H) 8 - 23 mg/dL   Creatinine, Ser 1.24 0.61 - 1.24 mg/dL   Calcium 9.3 8.9 - 10.3 mg/dL   GFR calc non Af Amer 50 (L) >60 mL/min   GFR calc Af Amer 58 (L) >60 mL/min   Anion gap 10 5 - 15  Lipase, blood     Status: None   Collection Time: 05/04/18  5:06 AM  Result Value Ref Range   Lipase 41 11 - 51 U/L  CBC with Differential/Platelet     Status: Abnormal   Collection Time: 05/04/18  5:07 AM  Result Value Ref Range   WBC 8.0 4.0 - 10.5 K/uL   RBC 2.60 (L) 4.22 - 5.81 MIL/uL   Hemoglobin 7.6 (L) 13.0 - 17.0 g/dL   HCT 24.4 (L) 39.0 - 52.0 %   MCV 93.8 80.0 - 100.0 fL   MCH  29.2 26.0 - 34.0 pg   MCHC 31.1 30.0 - 36.0 g/dL   RDW 13.8 11.5 - 15.5 %   Platelets 193 150 - 400 K/uL   nRBC 0.0 0.0 - 0.2 %   Neutrophils Relative % 83 %   Neutro Abs 6.7 1.7 - 7.7 K/uL   Lymphocytes Relative 8 %   Lymphs Abs 0.7 0.7 - 4.0 K/uL   Monocytes Relative 6 %   Monocytes Absolute 0.5 0.1 - 1.0 K/uL   Eosinophils Relative 1 %   Eosinophils Absolute 0.1 0.0 - 0.5 K/uL   Basophils Relative 1 %   Basophils Absolute 0.1 0.0 - 0.1 K/uL   Immature Granulocytes 1 %   Abs Immature Granulocytes 0.05 0.00 - 0.07 K/uL  Type and screen     Status: None (Preliminary result)   Collection Time: 05/04/18  5:07 AM  Result Value Ref Range   ABO/RH(D) A POS    Antibody Screen PENDING    Sample Expiration      05/07/2018 Performed at [facility], [place] [address], [place], [place] [address]    Imaging Studies: No results found.  ED COURSE and MDM  Nursing notes and initial vitals signs, including pulse oximetry, reviewed.  Vitals:   05/04/18 0428  BP: (!) 82/56  Pulse: 73  Resp: 18  Temp: 97.7 F (36.5 C)  TempSrc: Oral  SpO2: 97%   4:59 AM Second IV and IV fluid bolus ordered.   5:56 AM 2 units packed red blood cells ordered.  Will have patient admitted to the hospitalist service.  PROCEDURES   CRITICAL CARE Performed by: Karen Chafe Paulena Servais Total critical care time: 30 minutes Critical care time was exclusive of separately billable procedures and treating other patients. Critical care was necessary to treat or prevent imminent or life-threatening  deterioration. Critical care was time spent personally by me on the following activities: development of treatment plan with patient and/or surrogate as well as nursing, discussions with consultants, evaluation of patient's response to treatment, examination of patient, obtaining history from patient or surrogate, ordering and performing treatments and interventions, ordering and review of laboratory studies,  ordering and review of radiographic studies, pulse oximetry and re-evaluation of patient's condition.   ED DIAGNOSES     ICD-10-CM   1. Lower GI bleed K92.2   2. Anemia due to acute blood loss D62   3. Acute prerenal azotemia R79.89        Shanon Rosser, MD 05/04/18 [phone]

## 2018-05-04 NOTE — Anesthesia Preprocedure Evaluation (Addendum)
Anesthesia Evaluation  Patient identified by MRN, date of birth, ID band Patient awake    Reviewed: Allergy & Precautions, H&P , NPO status , Patient's Chart, lab work & pertinent test results  Airway Mallampati: II  TM Distance: >3 FB Neck ROM: full    Dental  (+) Teeth Intact, Dental Advisory Given, Caps, Implants,    Pulmonary neg shortness of breath, neg COPD, neg recent URI, former smoker,   About 1 week ago had productive cough.  Lungs bilat. Clear, w/ decrease in bases  breath sounds clear to auscultation       Cardiovascular hypertension, Pt. on medications and Pt. on home beta blockers (-) angina+ CAD, + Past MI, + CABG and + Peripheral Vascular Disease   Rhythm:Regular Rate:Normal     Neuro/Psych negative neurological ROS  negative psych ROS   GI/Hepatic Neg liver ROS, GERD  ,? GI bleed   Endo/Other  negative endocrine ROS  Renal/GU Renal InsufficiencyRenal disease     Musculoskeletal  (+) Arthritis ,   Abdominal   Peds  Hematology negative hematology ROS (+) anemia ,   Anesthesia Other Findings S/p CABG last visit with Cardiology March with no new concerns S/p AAA repair    Upper Molar bridge   Reproductive/Obstetrics                             Anesthesia Physical Anesthesia Plan  ASA: III  Anesthesia Plan: MAC and General   Post-op Pain Management:    Induction: Intravenous, Rapid sequence and Cricoid pressure planned  PONV Risk Score and Plan: 1 and Treatment may vary due to age or medical condition  Airway Management Planned: Nasal Cannula and Oral ETT  Additional Equipment: None  Intra-op Plan:   Post-operative Plan: Extubation in OR  Informed Consent: I have reviewed the patients History and Physical, chart, labs and discussed the procedure including the risks, benefits and alternatives for the proposed anesthesia with the patient or authorized  representative who has indicated his/her understanding and acceptance.   Dental advisory given  Plan Discussed with: CRNA  Anesthesia Plan Comments:        Anesthesia Quick Evaluation

## 2018-05-04 NOTE — Op Note (Signed)
[facility] Patient Name: Jesse [name] Procedure Date: 05/04/2018 MRN: [id number] Attending MD: [name] , MD Date of Birth: 06-20-1928 CSN: [id number] Age: 84 Admit Type: Outpatient Procedure:                Upper GI endoscopy Indications:              Periumbilical abdominal pain, Upper abdominal pain,                            Melena Providers:                [name], MD, Burtis Junes, RN, William Dalton,                            Technician Referring MD:              Medicines:                Monitored Anesthesia Care Complications:            No immediate complications. Estimated blood loss:                            None. Estimated Blood Loss:     Estimated blood loss was minimal. Procedure:                Pre-Anesthesia Assessment:                           - Prior to the procedure, a History and Physical                            was performed, and patient medications and                            allergies were reviewed. The patient's tolerance of                            previous anesthesia was also reviewed. The risks                            and benefits of the procedure and the sedation                            options and risks were discussed with the patient.                            All questions were answered, and informed consent                            was obtained. Prior Anticoagulants: The patient has                            taken aspirin, last dose was 1 day prior to                            procedure. ASA Grade Assessment:  III - A patient                            with severe systemic disease. After reviewing the                            risks and benefits, the patient was deemed in                            satisfactory condition to undergo the procedure.                           After obtaining informed consent, the endoscope was                            passed under direct vision. Throughout the             procedure, the patient's blood pressure, pulse, and                            oxygen saturations were monitored continuously. The                            GIF-H190 ([id number] Olympus adult endoscope was                            introduced through the mouth, and advanced to the                            second part of duodenum. The upper GI endoscopy was                            accomplished without difficulty. The patient                            tolerated the procedure well. Patient was intubated                            during the procedure for airway protection as more                            than 1300 ml of coffee ground fluid and blood clots                            was suctioned from the gastric cavity. Scope In: Scope Out: Findings:      Coffee ground fluid was found in the entire esophagus.      A widely patent Schatzki ring was found at the gastroesophageal junction.      A 2 cm hiatal hernia was present.      Hematin (altered blood/coffee-ground-like material) and clots was found       in the cardia, in the gastric fundus, in the gastric body, in the       gastric antrum, in the prepyloric region of the stomach and at the  pylorus. It was suctioned via the scope and large volume lavage was       performed. There was no evidence of active bleeding noted in the stomach.      One oozing cratered duodenal ulcer with a visible vessel was found in       the duodenal bulb. The lesion was 12 mm in largest dimension. Area was       successfully injected with 9.5 mL of a 1:10,000 solution of epinephrine       for hemostasis. Coagulation for hemostasis using bipolar probe was       successful.      Red blood was found in the duodenal bulb, in the first portion of the       duodenum and in the second portion of the duodenum. Impression:               - Coffee ground fluid in the esophagus.                           - Widely patent Schatzki ring.                            - 2 cm hiatal hernia.                           - Hematin (altered blood/coffee-ground-like                            material) in the cardia, in the gastric fundus, in                            the gastric body, in the gastric antrum, in the                            prepyloric region of the stomach and in the                            pylorus. Fluid aspiration performed.                           - One oozing duodenal ulcer with a visible vessel.                            Injected. Treated with bipolar cautery.                           - Blood in the duodenal bulb and in the second                            portion of the duodenum. Moderate Sedation:      Patient did not receive moderate sedation for this procedure, but       instead received monitored anesthesia care. Recommendation:           - NPO.                           - Give Protonix (pantoprazole): 8 mg/hr IV  by                            continuous infusion for 1 days. Procedure Code(s):        --- Professional ---                           [phone], Esophagogastroduodenoscopy, flexible,                            transoral; with control of bleeding, any method Diagnosis Code(s):        --- Professional ---                           K22.2, Esophageal obstruction                           K44.9, Diaphragmatic hernia without obstruction or                            gangrene                           K92.2, Gastrointestinal hemorrhage, unspecified                           K26.4, Chronic or unspecified duodenal ulcer with                            hemorrhage                           U76.54, Periumbilical pain                           R10.10, Upper abdominal pain, unspecified                           K92.1, Melena (includes Hematochezia) CPT copyright 2018 [employer]. All rights reserved. The codes documented in this report are preliminary and upon coder review may  be revised to meet  current compliance requirements. [name], MD 05/04/2018 10:08:14 AM This report has been signed electronically. Number of Addenda: 0

## 2018-05-04 NOTE — ED Notes (Signed)
Patient transported to endoscopy. Pt belongings taken to patients rm [id number]

## 2018-05-05 ENCOUNTER — Encounter (HOSPITAL_COMMUNITY): Payer: Self-pay | Admitting: Gastroenterology

## 2018-05-05 DIAGNOSIS — D649 Anemia, unspecified: Secondary | ICD-10-CM

## 2018-05-05 DIAGNOSIS — R7989 Other specified abnormal findings of blood chemistry: Secondary | ICD-10-CM

## 2018-05-05 DIAGNOSIS — K264 Chronic or unspecified duodenal ulcer with hemorrhage: Secondary | ICD-10-CM

## 2018-05-05 DIAGNOSIS — I951 Orthostatic hypotension: Secondary | ICD-10-CM

## 2018-05-05 DIAGNOSIS — R578 Other shock: Secondary | ICD-10-CM

## 2018-05-05 DIAGNOSIS — N179 Acute kidney failure, unspecified: Secondary | ICD-10-CM

## 2018-05-05 DIAGNOSIS — D62 Acute posthemorrhagic anemia: Secondary | ICD-10-CM

## 2018-05-05 DIAGNOSIS — I10 Essential (primary) hypertension: Secondary | ICD-10-CM

## 2018-05-05 HISTORY — DX: Acute kidney failure, unspecified: N17.9

## 2018-05-05 HISTORY — DX: Chronic or unspecified duodenal ulcer with hemorrhage: K26.4

## 2018-05-05 HISTORY — DX: Other shock: R57.8

## 2018-05-05 LAB — COMPREHENSIVE METABOLIC PANEL
ALT: 11 U/L (ref 0–44)
ANION GAP: 6 (ref 5–15)
AST: 18 U/L (ref 15–41)
Albumin: 2.7 g/dL — ABNORMAL LOW (ref 3.5–5.0)
Alkaline Phosphatase: 26 U/L — ABNORMAL LOW (ref 38–126)
BUN: 49 mg/dL — AB (ref 8–23)
CO2: 24 mmol/L (ref 22–32)
Calcium: 8.5 mg/dL — ABNORMAL LOW (ref 8.9–10.3)
Chloride: 115 mmol/L — ABNORMAL HIGH (ref 98–111)
Creatinine, Ser: 1 mg/dL (ref 0.61–1.24)
GFR calc non Af Amer: >60 mL/min (ref >60)
Glucose, Bld: 99 mg/dL (ref 70–99)
POTASSIUM: 3.7 mmol/L (ref 3.5–5.1)
Sodium: 145 mmol/L (ref 135–145)
TOTAL PROTEIN: 4.5 g/dL — AB (ref 6.5–8.1)
Total Bilirubin: 1 mg/dL (ref 0.3–1.2)

## 2018-05-05 LAB — CBC
HEMATOCRIT: 23.5 % — AB (ref 39.0–52.0)
HEMOGLOBIN: 7.4 g/dL — AB (ref 13.0–17.0)
MCH: 29.7 pg (ref 26.0–34.0)
MCHC: 31.5 g/dL (ref 30.0–36.0)
MCV: 94.4 fL (ref 80.0–100.0)
NRBC: 0 % (ref 0.0–0.2)
Platelets: 136 10*3/uL — ABNORMAL LOW (ref 150–400)
RBC: 2.49 MIL/uL — AB (ref 4.22–5.81)
RDW: 15.6 % — ABNORMAL HIGH (ref 11.5–15.5)
WBC: 7.5 10*3/uL (ref 4.0–10.5)

## 2018-05-05 LAB — HEMOGLOBIN AND HEMATOCRIT, BLOOD
HCT: 22.6 % — ABNORMAL LOW (ref 39.0–52.0)
HCT: 27 % — ABNORMAL LOW (ref 39.0–52.0)
HEMATOCRIT: 25.4 % — AB (ref 39.0–52.0)
HEMOGLOBIN: 8.2 g/dL — AB (ref 13.0–17.0)
Hemoglobin: 7.1 g/dL — ABNORMAL LOW (ref 13.0–17.0)
Hemoglobin: 8.6 g/dL — ABNORMAL LOW (ref 13.0–17.0)

## 2018-05-05 LAB — PREPARE RBC (CROSSMATCH)

## 2018-05-05 MED ORDER — SODIUM CHLORIDE 0.9% IV SOLUTION
Freq: Once | INTRAVENOUS | Status: AC
  Administered 2018-05-05: 09:00:00 via INTRAVENOUS

## 2018-05-05 MED ORDER — CARVEDILOL 12.5 MG PO TABS
12.5000 mg | ORAL_TABLET | Freq: Two times a day (BID) | ORAL | Status: DC
  Administered 2018-05-05 – 2018-05-08 (×6): 12.5 mg via ORAL
  Filled 2018-05-05 (×6): qty 1

## 2018-05-05 MED ORDER — ACETAMINOPHEN 325 MG PO TABS
325.0000 mg | ORAL_TABLET | Freq: Four times a day (QID) | ORAL | Status: DC | PRN
  Administered 2018-05-05 (×2): 325 mg via ORAL
  Administered 2018-05-06: 650 mg via ORAL
  Filled 2018-05-05 (×2): qty 1
  Filled 2018-05-05: qty 2

## 2018-05-05 MED ORDER — FUROSEMIDE 10 MG/ML IJ SOLN: 40.0000 mg | Freq: Once | INTRAMUSCULAR | Status: DC

## 2018-05-05 MED ORDER — ACETAMINOPHEN 325 MG PO TABS
325.0000 mg | ORAL_TABLET | Freq: Once | ORAL | Status: AC
  Administered 2018-05-05: 325 mg via ORAL
  Filled 2018-05-05: qty 1

## 2018-05-05 NOTE — Progress Notes (Signed)
Subjective: Some residual blood in stool. No abdominal pain.  Objective: Vital signs in last 24 hours: Temp:  [97.4 F (36.3 C)-99.2 F (37.3 C)] 97.9 F (36.6 C) (10/12 1123) Pulse Rate:  [60-83] 62 (10/12 1123) Resp:  [11-22] 16 (10/12 1123) BP: (77-123)/(40-79) 119/54 (10/12 1123) SpO2:  [94 %-100 %] 97 % (10/12 1123) Weight:  [82 kg] 82 kg (10/11 1200) Weight change:  Last BM Date: 05/04/18  PE: GEN:  Elderly, pale, NAD ABD: Soft, non-tender  Lab Results: CBC    Component Value Date/Time   WBC 7.5 05/05/2018 0711   RBC 2.49 (L) 05/05/2018 0711   HGB 7.4 (L) 05/05/2018 0711   HCT 23.5 (L) 05/05/2018 0711   PLT 136 (L) 05/05/2018 0711   MCV 94.4 05/05/2018 0711   MCH 29.7 05/05/2018 0711   MCHC 31.5 05/05/2018 0711   RDW 15.6 (H) 05/05/2018 0711   LYMPHSABS 0.7 05/04/2018 0507   MONOABS 0.5 05/04/2018 0507   EOSABS 0.1 05/04/2018 0507   BASOSABS 0.1 05/04/2018 0507   CMP     Component Value Date/Time   NA 145 05/05/2018 0711   NA 141 03/02/2017 0753   K 3.7 05/05/2018 0711   CL 115 (H) 05/05/2018 0711   CO2 24 05/05/2018 0711   GLUCOSE 99 05/05/2018 0711   GLUCOSE 100 (H) 05/05/2006 0839   BUN 49 (H) 05/05/2018 0711   BUN 26 03/02/2017 0753   CREATININE 1.00 05/05/2018 0711   CREATININE 0.99 03/10/2014 1445   CALCIUM 8.5 (L) 05/05/2018 0711   PROT 4.5 (L) 05/05/2018 0711   PROT 5.6 (L) 03/02/2017 0753   ALBUMIN 2.7 (L) 05/05/2018 0711   ALBUMIN 3.7 03/02/2017 0753   AST 18 05/05/2018 0711   ALT 11 05/05/2018 0711   ALKPHOS 26 (L) 05/05/2018 0711   BILITOT 1.0 05/05/2018 0711   BILITOT 0.6 03/02/2017 0753   GFRNONAA >60 05/05/2018 0711   GFRAA >60 05/05/2018 0711   Assessment:  1.  Melena, less but persistent (old blood?). 2.  Acute blood loss anemia, persistent, re-equilibration? 3.  Duodenal ulcer.  NSAID (ASA 81 only) versus H. Pylori?  Plan:  1.  Clear liquid diet ok. 2.  PPI drip for at least another 24 hours. 3.  If ongoing melena  and non-responsive anemia, would have to consider repeat endoscopy in the next couple days. 4.  Check H. Pylori serologies. 5.  [facility] will follow.    Landry Dyke 05/05/2018, 11:28 AM   Cell [phone] If no answer or after 5 PM call [phone]

## 2018-05-05 NOTE — Progress Notes (Signed)
[facility] PROGRESS NOTE    Progress Note  Jesse [name]  QIO:[id number] DOB: 1928/06/26 DOA: 05/04/2018 PCP: [name], MD     Brief Narrative:   Jesse [name] is an 84 y.o. male medical history of CAD status post CABG, AAA status post repair presents with 3 days of abdominal discomfort he has been taking Alka-Seltzer over the past week but denies any other NSAIDs denies smoking or alcohol abuse.  Assessment/Plan:   Melena/GI bleed/ Duodenal ulcer with hemorrhage/Hemorrhagic shock [facility]): Likely due to NSAIDs open (all cultures are has aspirin. Status post 2 units of packed red blood cells. Status post endoscopy on 05/04/2018 that showed Schatzki ring 2 cm hiatal hernia and an oozing duodenal ulcer that was treated. His hemoglobin is drifting down we will give him 2 units of packed red blood cells and will give Lasix in between.   Keep hemoglobin above 8 Resume Coreg hold hydrochlorothiazide and lisinopril.  Essential hypertension: Hypotension now resolved. Resume Coreg.  AKI (acute kidney injury) ([place]) With a baseline creatinine of less than 1 on admission 1.2. He is to get 2 units of packed red blood cells were checked post transfusional.   DVT prophylaxis: SCD Family Communication:none Disposition Plan/Barrier to D/C: keep in SDU Code Status:     Code Status Orders  (From admission, onward)         Start     Ordered   05/04/18 1444  Do not attempt resuscitation (DNR)  Continuous    Question Answer Comment  In the event of cardiac or respiratory ARREST Do not call a "code blue"   In the event of cardiac or respiratory ARREST Do not perform Intubation, CPR, defibrillation or ACLS   In the event of cardiac or respiratory ARREST Use medication by any route, position, wound care, and other measures to relive pain and suffering. May use oxygen, suction and manual treatment of airway obstruction as needed for comfort.      05/04/18 1443        Code Status  History    Date Active Date Inactive Code Status Order ID Comments User Context   06/09/2015 1037 06/11/2015 1711 Full Code [id number]  Herbie Saxon Inpatient   05/07/2012 1400 05/09/2012 1556 Full Code [id number]  Jolyne Loa, RN Inpatient   03/19/2012 1157 03/21/2012 1901 Full Code [id number]  Johnsie Cancel, RN Inpatient    Advance Directive Documentation     Most Recent Value  Type of Advance Directive  Healthcare Power of [place], Living will  Pre-existing out of facility DNR order (yellow form or pink MOST form)  -  "MOST" Form in Place?  -        IV Access:    Peripheral IV   Procedures and diagnostic studies:   Ct Abdomen Pelvis W Contrast  Result Date: 05/04/2018 CLINICAL DATA:  Generalized abdominal pain and hypotension EXAM: CT ABDOMEN AND PELVIS WITH CONTRAST TECHNIQUE: Multidetector CT imaging of the abdomen and pelvis was performed using the standard protocol following bolus administration of intravenous contrast. CONTRAST:  66mL ISOVUE-300 IOPAMIDOL (ISOVUE-300) INJECTION 61% COMPARISON:  May 18, 2016 FINDINGS: Lower chest: There is fibrotic change in the lung bases. There is atelectatic change in the inferior most aspect of the lingula. There are multiple foci coronary artery calcification. The distal descending thoracic aorta appears somewhat ectatic. Hepatobiliary: No focal liver lesions are evident. There is cholelithiasis. There is no gallbladder wall thickening or pericholecystic fluid. There is no biliary duct  dilatation. Pancreas: No pancreatic mass or inflammatory focus evident. Spleen: No splenic lesions are appreciable. Adrenals/Urinary Tract: Adrenals bilaterally appear unremarkable. There is a cyst in the mid right kidney measuring 3.8 x 3.2 cm. There is a 1 x 1 cm cyst in the medial lower pole right kidney. There is a 1 x 1 cm cyst in the mid left kidney. There is no hydronephrosis on either side. There is no renal or ureteral calculus on either  side. Urinary bladder is midline with wall thickness within normal limits. Stomach/Bowel: There is no appreciable bowel wall or mesenteric thickening. There is no evident bowel obstruction. There is no free air or portal venous air. Vascular/Lymphatic: There is extensive aortic atherosclerosis. There is dilatation of the distal abdominal aorta with a maximum transverse diameter of 4.0 x 3.9 cm, essentially stable from prior study. There is extensive thrombus in the leftward aspect of this area of aneurysmal dilatation. Patient has had aortobifemoral grafting. There is retrograde filling of an aneurysm arising from the internal iliac artery on the right. This aneurysm measures 7.3 x 6.9 cm, larger than on prior study. There is also aneurysmal dilatation in the left internal iliac region measuring 5.4 x 4.7 cm. There is moderate thrombus in this aneurysm. This aneurysm also is larger than on the previous study. There is an aneurysm arising from the lateral right common femoral artery measuring 2.4 x 1.6 cm, similar to prior study. This aneurysm is largely thrombosed. Note that there are embolization coils in the proximal internal iliac arteries bilaterally. Major venous structures appear patent. There is no evident adenopathy in the abdomen or pelvis. Reproductive: There are presumed seed implants along the periphery of the prostate. Prostate and seminal vesicles are normal in size and contour. Other: Appendix appears normal. No abscess or ascites is evident in the abdomen or pelvis. Musculoskeletal: There is degenerative change in the lumbar spine. Bones are osteoporotic. There are no blastic or lytic bone lesions. There are no intramuscular lesions. IMPRESSION: 1. Previous aortobifemoral grafting with coils in the internal iliac arteries. There is aneurysmal dilatation of the distal abdominal aorta measuring 4.0 x 3.9 cm, stable from prior study with thrombus in the aorta. 2. 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 This filling is more prominent on the right than on previous study. Given enlargement of these aneurysms since previous study, vascular surgery consultation is felt to be advisable. Note that there is a smaller largely thrombosed aneurysm arising from the lateral right femoral artery measuring 2.4 x 1.6 cm. 3. Post radiation therapy change in the prostate region. Prostate not enlarged. 4.  Cholelithiasis.  No gallbladder wall thickening evident by CT. 5. No bowel obstruction. No abscess in the abdomen or pelvis. Appendix region appears normal. 6.  Fibrotic change in lung bases. Electronically Signed   By: Lowella Grip III M.D.   On: 05/04/2018 08:53     Medical Consultants:    None.  Anti-Infectives:     Subjective:    Jesse [name] he relates he is thirsty lightheadedness is resolved anorexic  Objective:    Vitals:   05/05/18 0225 05/05/18 0353 05/05/18 0400 05/05/18 0600  BP: (!) 98/40  (!) 77/40 (!) 123/49  Pulse: 70  62 62  Resp: 14  14 16   Temp:  98.1 F (36.7 C)    TempSrc:  Oral    SpO2: 96%  96% 98%  Weight:  Height:        Intake/Output Summary (Last 24 hours) at 05/05/2018 0713 Last data filed at 05/05/2018 0600 Gross per 24 hour  Intake 4267.16 ml  Output 1170 ml  Net 3097.16 ml   Filed Weights   05/04/18 0838 05/04/18 1200  Weight: 81.6 kg 82 kg    Exam: General exam: In no acute distress. Respiratory system: Good air movement and clear to auscultation. Cardiovascular system: S1 & S2 heard, RRR.  Gastrointestinal system: Abdomen is nondistended, soft and nontender.  Central nervous system: Alert and oriented. No focal neurological deficits. Extremities: No pedal edema. Skin: No rashes, lesions or ulcers Psychiatry: Judgement and insight appear normal. Mood & affect appropriate.    Data Reviewed:    Labs: Basic Metabolic Panel: Recent Labs  Lab  05/04/18 0506  NA 146*  K 3.7  CL 111  CO2 25  GLUCOSE 148*  BUN 66*  CREATININE 1.24  CALCIUM 9.3   GFR Estimated Creatinine Clearance: 45.2 mL/min (by C-G formula based on SCr of 1.24 mg/dL). Liver Function Tests: Recent Labs  Lab 05/04/18 0506  AST 16  ALT 11  ALKPHOS 28*  BILITOT 0.8  PROT 4.7*  ALBUMIN 2.8*   Recent Labs  Lab 05/04/18 0506  LIPASE 41   No results for input(s): AMMONIA in the last 168 hours. Coagulation profile No results for input(s): INR, PROTIME in the last 168 hours.  CBC: Recent Labs  Lab 05/04/18 0507 05/04/18 1955 05/05/18 0113  WBC 8.0  --   --   NEUTROABS 6.7  --   --   HGB 7.6* 7.5* 7.1*  HCT 24.4* 23.4* 22.6*  MCV 93.8  --   --   PLT 193  --   --    Cardiac Enzymes: No results for input(s): CKTOTAL, CKMB, CKMBINDEX, TROPONINI in the last 168 hours. BNP (last 3 results) No results for input(s): PROBNP in the last 8760 hours. CBG: Recent Labs  Lab 05/04/18 0447  GLUCAP 131*   D-Dimer: No results for input(s): DDIMER in the last 72 hours. Hgb A1c: No results for input(s): HGBA1C in the last 72 hours. Lipid Profile: No results for input(s): CHOL, HDL, LDLCALC, TRIG, CHOLHDL, LDLDIRECT in the last 72 hours. Thyroid function studies: No results for input(s): TSH, T4TOTAL, T3FREE, THYROIDAB in the last 72 hours.  Invalid input(s): FREET3 Anemia work up: No results for input(s): VITAMINB12, FOLATE, FERRITIN, TIBC, IRON, RETICCTPCT in the last 72 hours. Sepsis Labs: Recent Labs  Lab 05/04/18 0507  WBC 8.0   Microbiology Recent Results (from the past 240 hour(s))  MRSA PCR Screening     Status: None   Collection Time: 05/04/18  2:22 PM  Result Value Ref Range Status   MRSA by PCR NEGATIVE NEGATIVE Final    Comment:        The GeneXpert MRSA Assay (FDA approved for NASAL specimens only), is one component of a comprehensive MRSA colonization surveillance program. It is not intended to diagnose MRSA infection  nor to guide or monitor treatment for MRSA infections. Performed at [facility], [place] [address] [place], [place] [address]      Medications:   . [START ON 05/07/2018] pantoprazole  40 mg Intravenous Q12H   Continuous Infusions: . pantoprozole (PROTONIX) infusion 8 mg/hr (05/05/18 0307)      LOS: 1 day   Charlynne Cousins  [facility] Pager [phone]  *Please refer to [email or web address], password [email or web address] to get updated schedule on who will round  on this patient, as hospitalists switch teams weekly. If 7PM-7AM, please contact night-coverage at [email or web address], password [email or web address] for any overnight needs.  05/05/2018, 7:13 AM

## 2018-05-06 LAB — HEMOGLOBIN AND HEMATOCRIT, BLOOD
HCT: 24.2 % — ABNORMAL LOW (ref 39.0–52.0)
HCT: 30.2 % — ABNORMAL LOW (ref 39.0–52.0)
HCT: 28.1 % — ABNORMAL LOW (ref 39.0–52.0)
HEMATOCRIT: 24.7 % — AB (ref 39.0–52.0)
HEMOGLOBIN: 9.7 g/dL — AB (ref 13.0–17.0)
Hemoglobin: 7.8 g/dL — ABNORMAL LOW (ref 13.0–17.0)
Hemoglobin: 7.9 g/dL — ABNORMAL LOW (ref 13.0–17.0)
Hemoglobin: 8.9 g/dL — ABNORMAL LOW (ref 13.0–17.0)

## 2018-05-06 LAB — PREPARE RBC (CROSSMATCH)

## 2018-05-06 MED ORDER — SODIUM CHLORIDE 0.9% IV SOLUTION: Freq: Once | INTRAVENOUS | Status: DC

## 2018-05-06 MED ORDER — SODIUM CHLORIDE 0.9% IV SOLUTION
Freq: Once | INTRAVENOUS | Status: AC
  Administered 2018-05-06: 08:00:00 via INTRAVENOUS

## 2018-05-06 NOTE — Progress Notes (Signed)
Subjective: No further bleeding. No abdominal pain.  Objective: Vital signs in last 24 hours: Temp:  [97.2 F (36.2 C)-98.6 F (37 C)] 98.3 F (36.8 C) (10/13 1120) Pulse Rate:  [47-126] 64 (10/13 1000) Resp:  [12-21] 21 (10/13 1000) BP: (94-164)/(38-87) 130/69 (10/13 1000) SpO2:  [92 %-100 %] 98 % (10/13 1000) Weight:  [86.1 kg] 86.1 kg (10/13 0542) Weight change: 4.453 kg Last BM Date: 05/05/18(small smear no formed movement )  PE: GEN:  NAD, elderly but alert ABD:  Protuberant, soft, non-tender  Lab Results: CBC    Component Value Date/Time   WBC 7.5 05/05/2018 0711   RBC 2.49 (L) 05/05/2018 0711   HGB 7.9 (L) 05/06/2018 0648   HCT 24.7 (L) 05/06/2018 0648   PLT 136 (L) 05/05/2018 0711   MCV 94.4 05/05/2018 0711   MCH 29.7 05/05/2018 0711   MCHC 31.5 05/05/2018 0711   RDW 15.6 (H) 05/05/2018 0711   LYMPHSABS 0.7 05/04/2018 0507   MONOABS 0.5 05/04/2018 0507   EOSABS 0.1 05/04/2018 0507   BASOSABS 0.1 05/04/2018 0507   CMP     Component Value Date/Time   NA 145 05/05/2018 0711   NA 141 03/02/2017 0753   K 3.7 05/05/2018 0711   CL 115 (H) 05/05/2018 0711   CO2 24 05/05/2018 0711   GLUCOSE 99 05/05/2018 0711   GLUCOSE 100 (H) 05/05/2006 0839   BUN 49 (H) 05/05/2018 0711   BUN 26 03/02/2017 0753   CREATININE 1.00 05/05/2018 0711   CREATININE 0.99 03/10/2014 1445   CALCIUM 8.5 (L) 05/05/2018 0711   PROT 4.5 (L) 05/05/2018 0711   PROT 5.6 (L) 03/02/2017 0753   ALBUMIN 2.7 (L) 05/05/2018 0711   ALBUMIN 3.7 03/02/2017 0753   AST 18 05/05/2018 0711   ALT 11 05/05/2018 0711   ALKPHOS 26 (L) 05/05/2018 0711   BILITOT 1.0 05/05/2018 0711   BILITOT 0.6 03/02/2017 0753   GFRNONAA >60 05/05/2018 0711   GFRAA >60 05/05/2018 0711   Assessment:  1.  Melena, resolved. 2.  Acute blood loss anemia, persistent, re-equilibration?  No overt bleeding noted. 3.  Duodenal ulcer.  NSAID (ASA 81 only) versus H. Pylori?  Plan:  1.  PPI gtt for another 24 hours, then  transition to oral. 2.  IV fluids, volume repletion, supportive care. 3.  Advance diet to soft diet. 4.  Awaiting H. Pylori serologies. 5.  [facility] will follow.   Landry Dyke 05/06/2018, 11:31 AM   Cell [phone] If no answer or after 5 PM call [phone]

## 2018-05-06 NOTE — Progress Notes (Addendum)
[facility] PROGRESS NOTE    Progress Note  SHOLOM DULUDE  GYJ:[id number] DOB: [birth year] DOA: 05/04/2018 PCP: [name], MD     Brief Narrative:   Cobey Viona Gilmore Schappert is an 84 y.o. male medical history of CAD status post CABG, AAA status post repair presents with 3 days of abdominal discomfort he has been taking Alka-Seltzer over the past week but denies any other NSAIDs denies smoking or alcohol abuse.  Assessment/Plan:   Melena/GI bleed/ Duodenal ulcer with hemorrhage/Hemorrhagic shock [facility]): Likely due to NSAIDs open (alka ztser has aspirin.) Status post 4 units of packed red blood cells, as of 05/06/2018. Status post endoscopy on 05/04/2018 that showed Schatzki ring 2 cm hiatal hernia and an oozing duodenal ulcer that was treated. Hemoglobin continues to drift down we will transfuse an additional unit of packed red blood cells Keep hemoglobin above 8 Resume Coreg hold hydrochlorothiazide and lisinopril. Continue IV Protonix.  Essential hypertension: Hypotension now resolved. Resume Coreg.  AKI (acute kidney injury) ([place]) With a baseline creatinine of less than 1 on admission 1.2. He is to get 2 units of packed red blood cells were checked post transfusional.   DVT prophylaxis: SCD Family Communication:none Disposition Plan/Barrier to D/C: keep in SDU Code Status:     Code Status Orders  (From admission, onward)         Start     Ordered   05/04/18 1444  Do not attempt resuscitation (DNR)  Continuous    Question Answer Comment  In the event of cardiac or respiratory ARREST Do not call a "code blue"   In the event of cardiac or respiratory ARREST Do not perform Intubation, CPR, defibrillation or ACLS   In the event of cardiac or respiratory ARREST Use medication by any route, position, wound care, and other measures to relive pain and suffering. May use oxygen, suction and manual treatment of airway obstruction as needed for comfort.      05/04/18 1443        Code Status History    Date Active Date Inactive Code Status Order ID Comments User Context   06/09/2015 1037 06/11/2015 1711 Full Code [id number]  Herbie Saxon Inpatient   05/07/2012 1400 05/09/2012 1556 Full Code [id number]  Jolyne Loa, RN Inpatient   03/19/2012 1157 03/21/2012 1901 Full Code [id number]  Johnsie Cancel, RN Inpatient    Advance Directive Documentation     Most Recent Value  Type of Advance Directive  Healthcare Power of [place], Living will  Pre-existing out of facility DNR order (yellow form or pink MOST form)  -  "MOST" Form in Place?  -        IV Access:    Peripheral IV   Procedures and diagnostic studies:   Ct Abdomen Pelvis W Contrast  Result Date: 05/04/2018 CLINICAL DATA:  Generalized abdominal pain and hypotension EXAM: CT ABDOMEN AND PELVIS WITH CONTRAST TECHNIQUE: Multidetector CT imaging of the abdomen and pelvis was performed using the standard protocol following bolus administration of intravenous contrast. CONTRAST:  58mL ISOVUE-300 IOPAMIDOL (ISOVUE-300) INJECTION 61% COMPARISON:  May 18, 2016 FINDINGS: Lower chest: There is fibrotic change in the lung bases. There is atelectatic change in the inferior most aspect of the lingula. There are multiple foci coronary artery calcification. The distal descending thoracic aorta appears somewhat ectatic. Hepatobiliary: No focal liver lesions are evident. There is cholelithiasis. There is no gallbladder wall thickening or pericholecystic fluid. There is no biliary duct dilatation. Pancreas: No pancreatic  mass or inflammatory focus evident. Spleen: No splenic lesions are appreciable. Adrenals/Urinary Tract: Adrenals bilaterally appear unremarkable. There is a cyst in the mid right kidney measuring 3.8 x 3.2 cm. There is a 1 x 1 cm cyst in the medial lower pole right kidney. There is a 1 x 1 cm cyst in the mid left kidney. There is no hydronephrosis on either side. There is no renal or ureteral  calculus on either side. Urinary bladder is midline with wall thickness within normal limits. Stomach/Bowel: There is no appreciable bowel wall or mesenteric thickening. There is no evident bowel obstruction. There is no free air or portal venous air. Vascular/Lymphatic: There is extensive aortic atherosclerosis. There is dilatation of the distal abdominal aorta with a maximum transverse diameter of 4.0 x 3.9 cm, essentially stable from prior study. There is extensive thrombus in the leftward aspect of this area of aneurysmal dilatation. Patient has had aortobifemoral grafting. There is retrograde filling of an aneurysm arising from the internal iliac artery on the right. This aneurysm measures 7.3 x 6.9 cm, larger than on prior study. There is also aneurysmal dilatation in the left internal iliac region measuring 5.4 x 4.7 cm. There is moderate thrombus in this aneurysm. This aneurysm also is larger than on the previous study. There is an aneurysm arising from the lateral right common femoral artery measuring 2.4 x 1.6 cm, similar to prior study. This aneurysm is largely thrombosed. Note that there are embolization coils in the proximal internal iliac arteries bilaterally. Major venous structures appear patent. There is no evident adenopathy in the abdomen or pelvis. Reproductive: There are presumed seed implants along the periphery of the prostate. Prostate and seminal vesicles are normal in size and contour. Other: Appendix appears normal. No abscess or ascites is evident in the abdomen or pelvis. Musculoskeletal: There is degenerative change in the lumbar spine. Bones are osteoporotic. There are no blastic or lytic bone lesions. There are no intramuscular lesions. IMPRESSION: 1. Previous aortobifemoral grafting with coils in the internal iliac arteries. There is aneurysmal dilatation of the distal abdominal aorta measuring 4.0 x 3.9 cm, stable from prior study with thrombus in the aorta. 2. 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 This filling is more prominent on the right than on previous study. Given enlargement of these aneurysms since previous study, vascular surgery consultation is felt to be advisable. Note that there is a smaller largely thrombosed aneurysm arising from the lateral right femoral artery measuring 2.4 x 1.6 cm. 3. Post radiation therapy change in the prostate region. Prostate not enlarged. 4.  Cholelithiasis.  No gallbladder wall thickening evident by CT. 5. No bowel obstruction. No abscess in the abdomen or pelvis. Appendix region appears normal. 6.  Fibrotic change in lung bases. Electronically Signed   By: Lowella Grip III M.D.   On: 05/04/2018 08:53     Medical Consultants:    None.  Anti-Infectives:     Subjective:    Trevante W [place] tenderness has resolved, no melanotic or bright red stools per rectum.  Objective:    Vitals:   05/06/18 0100 05/06/18 0400 05/06/18 0542 05/06/18 0600  BP: (!) 110/46 (!) 94/38  135/63  Pulse: 61 (!) 56  (!) 58  Resp: 12 15  16   Temp:  98.2 F (36.8 C)    TempSrc:  Oral    SpO2: 95% 95%  97%  Weight:   86.1 kg  Height:        Intake/Output Summary (Last 24 hours) at 05/06/2018 0709 Last data filed at 05/06/2018 0600 Gross per 24 hour  Intake 1221.74 ml  Output 1150 ml  Net 71.74 ml   Filed Weights   05/04/18 0838 05/04/18 1200 05/06/18 0542  Weight: 81.6 kg 82 kg 86.1 kg    Exam: General exam: In no acute distress. Respiratory system: Good air movement and clear to auscultation. Cardiovascular system: S1 & S2 heard, RRR.  Gastrointestinal system: Abdomen is nondistended, soft and nontender.  Central nervous system: Alert and oriented. No focal neurological deficits. Extremities: No pedal edema. Skin: No rashes, lesions or ulcers Psychiatry: Judgement and insight appear normal. Mood & affect appropriate.    Data Reviewed:     Labs: Basic Metabolic Panel: Recent Labs  Lab 05/04/18 0506 05/05/18 0711  NA 146* 145  K 3.7 3.7  CL 111 115*  CO2 25 24  GLUCOSE 148* 99  BUN 66* 49*  CREATININE 1.24 1.00  CALCIUM 9.3 8.5*   GFR Estimated Creatinine Clearance: 56 mL/min (by C-G formula based on SCr of 1 mg/dL). Liver Function Tests: Recent Labs  Lab 05/04/18 0506 05/05/18 0711  AST 16 18  ALT 11 11  ALKPHOS 28* 26*  BILITOT 0.8 1.0  PROT 4.7* 4.5*  ALBUMIN 2.8* 2.7*   Recent Labs  Lab 05/04/18 0506  LIPASE 41   No results for input(s): AMMONIA in the last 168 hours. Coagulation profile No results for input(s): INR, PROTIME in the last 168 hours.  CBC: Recent Labs  Lab 05/04/18 0507  05/05/18 0113 05/05/18 0711 05/05/18 1657 05/05/18 2027 05/06/18 0102  WBC 8.0  --   --  7.5  --   --   --   NEUTROABS 6.7  --   --   --   --   --   --   HGB 7.6*   < > 7.1* 7.4* 8.6* 8.2* 7.8*  HCT 24.4*   < > 22.6* 23.5* 27.0* 25.4* 24.2*  MCV 93.8  --   --  94.4  --   --   --   PLT 193  --   --  136*  --   --   --    < > = values in this interval not displayed.   Cardiac Enzymes: No results for input(s): CKTOTAL, CKMB, CKMBINDEX, TROPONINI in the last 168 hours. BNP (last 3 results) No results for input(s): PROBNP in the last 8760 hours. CBG: Recent Labs  Lab 05/04/18 0447  GLUCAP 131*   D-Dimer: No results for input(s): DDIMER in the last 72 hours. Hgb A1c: No results for input(s): HGBA1C in the last 72 hours. Lipid Profile: No results for input(s): CHOL, HDL, LDLCALC, TRIG, CHOLHDL, LDLDIRECT in the last 72 hours. Thyroid function studies: No results for input(s): TSH, T4TOTAL, T3FREE, THYROIDAB in the last 72 hours.  Invalid input(s): FREET3 Anemia work up: No results for input(s): VITAMINB12, FOLATE, FERRITIN, TIBC, IRON, RETICCTPCT in the last 72 hours. Sepsis Labs: Recent Labs  Lab 05/04/18 0507 05/05/18 0711  WBC 8.0 7.5   Microbiology Recent Results (from the past 240  hour(s))  MRSA PCR Screening     Status: None   Collection Time: 05/04/18  2:22 PM  Result Value Ref Range Status   MRSA by PCR NEGATIVE NEGATIVE Final    Comment:        The GeneXpert MRSA Assay (FDA approved for NASAL specimens only), is one component of  a comprehensive MRSA colonization surveillance program. It is not intended to diagnose MRSA infection nor to guide or monitor treatment for MRSA infections. Performed at [facility], [place] [address], [place], [place] [address]      Medications:   . sodium chloride   Intravenous Once  . sodium chloride   Intravenous Once  . carvedilol  12.5 mg Oral BID WC  . [START ON 05/07/2018] pantoprazole  40 mg Intravenous Q12H   Continuous Infusions: . pantoprozole (PROTONIX) infusion 8 mg/hr (05/05/18 2319)      LOS: 2 days   Charlynne Cousins  [facility] Pager [phone]  *Please refer to [email or web address], password [email or web address] to get updated schedule on who will round on this patient, as hospitalists switch teams weekly. If 7PM-7AM, please contact night-coverage at [email or web address], password [email or web address] for any overnight needs.  05/06/2018, 7:09 AM

## 2018-05-07 LAB — TYPE AND SCREEN
ABO/RH(D): A POS
ABO/RH(D): A POS
Antibody Screen: NEGATIVE
Antibody Screen: NEGATIVE
UNIT DIVISION: 0
UNIT DIVISION: 0
UNIT DIVISION: 0
UNIT DIVISION: 0
Unit division: 0

## 2018-05-07 LAB — HEMOGLOBIN AND HEMATOCRIT, BLOOD
HEMATOCRIT: 28.9 % — AB (ref 39.0–52.0)
HEMATOCRIT: 26.8 % — AB (ref 39.0–52.0)
HEMOGLOBIN: 8.7 g/dL — AB (ref 13.0–17.0)
Hemoglobin: 9.3 g/dL — ABNORMAL LOW (ref 13.0–17.0)

## 2018-05-07 LAB — BPAM RBC
BLOOD PRODUCT EXPIRATION DATE: 201911022359
Blood Product Expiration Date: 201911012359
Blood Product Expiration Date: 201911012359
Blood Product Expiration Date: 201911022359
Blood Product Expiration Date: 201911022359
ISSUE DATE / TIME: 201910110654
ISSUE DATE / TIME: 201910111343
ISSUE DATE / TIME: 201910120852
ISSUE DATE / TIME: 201910121222
ISSUE DATE / TIME: 201910130726
UNIT TYPE AND RH: 6200
UNIT TYPE AND RH: 6200
Unit Type and Rh: 6200
Unit Type and Rh: 6200
Unit Type and Rh: 6200

## 2018-05-07 LAB — H. PYLORI ANTIBODY, IGG: H Pylori IgG: 4.57 Index Value — ABNORMAL HIGH (ref 0.00–0.79)

## 2018-05-07 MED ORDER — PANTOPRAZOLE SODIUM 40 MG PO TBEC
40.0000 mg | DELAYED_RELEASE_TABLET | Freq: Two times a day (BID) | ORAL | Status: DC
  Administered 2018-05-07 – 2018-05-08 (×3): 40 mg via ORAL
  Filled 2018-05-07 (×3): qty 1

## 2018-05-07 MED ORDER — SODIUM CHLORIDE 0.9 % IV SOLN
INTRAVENOUS | Status: DC
  Administered 2018-05-07 – 2018-05-08 (×2): via INTRAVENOUS

## 2018-05-07 NOTE — Progress Notes (Signed)
Subjective: No blood in stool. No abdominal pain.  Objective: Vital signs in last 24 hours: Temp:  [98.2 F (36.8 C)-99.2 F (37.3 C)] 98.5 F (36.9 C) (10/14 0908) Pulse Rate:  [59-74] 67 (10/14 0908) Resp:  [12-19] 16 (10/14 0908) BP: (102-155)/(46-106) 103/64 (10/14 0908) SpO2:  [94 %-100 %] 97 % (10/14 0908) Weight:  [85.7 kg] 85.7 kg (10/14 0443) Weight change: -0.4 kg Last BM Date: 05/05/18(small smear no formed movement )  PE: GEN:  NAD, elderly  Lab Results: CBC    Component Value Date/Time   WBC 7.5 05/05/2018 0711   RBC 2.49 (L) 05/05/2018 0711   HGB 8.7 (L) 05/07/2018 0704   HCT 26.8 (L) 05/07/2018 0704   PLT 136 (L) 05/05/2018 0711   MCV 94.4 05/05/2018 0711   MCH 29.7 05/05/2018 0711   MCHC 31.5 05/05/2018 0711   RDW 15.6 (H) 05/05/2018 0711   LYMPHSABS 0.7 05/04/2018 0507   MONOABS 0.5 05/04/2018 0507   EOSABS 0.1 05/04/2018 0507   BASOSABS 0.1 05/04/2018 0507    Assessment:  1. Melena, resolved. 2. Acute blood loss anemia, persistent, re-equilibration?  No overt bleeding noted. 3. Duodenal ulcer. NSAID (ASA 81 only) versus H. Pylori?  Plan:  1.  Oral PPI. 2.  Advance diet. 3.  Check H. Pylori. 4.  PT evaluation. 5.  HOpefully home tomorrow. 6.  Follow up [facility] in a few weeks; we'll sign-off.   Landry Dyke 05/07/2018, 11:02 AM   Cell [phone] If no answer or after 5 PM call [phone]

## 2018-05-07 NOTE — Care Management Note (Signed)
Case Management Note  Patient Details  Name: MIKYLE SOX MRN: [id number] Date of Birth: 1928-06-23  Subjective/Objective:                  Lower gi bleed patient was taking alka seltzer prior to bleed  Action/Plan: Following for progression and cm needs/pt being moved out of sdu to med-surg. Floor [id number]/ lives alone  Expected Discharge Date:  (unknown)               Expected Discharge Plan:  Home/Self Care  In-House Referral:     Discharge planning Services  CM Consult  Post Acute Care Choice:    Choice offered to:     DME Arranged:    DME Agency:     HH Arranged:    HH Agency:     Status of Service:  In process, will continue to follow  If discussed at Long Length of Stay Meetings, dates discussed:    Additional Comments:  Leeroy Cha, RN 05/07/2018, 8:45 AM

## 2018-05-07 NOTE — Care Management Important Message (Signed)
Important Message  Patient Details  Name: KENTA LASTER MRN: [id number] Date of Birth: Feb 23, 1928   Medicare Important Message Given:  Yes    Kerin Salen 05/07/2018, 1:20 [place] Message  Patient Details  Name: KENTRELL HALLAHAN MRN: [id number] Date of Birth: 03-15-1928   Medicare Important Message Given:  Yes    Kerin Salen 05/07/2018, 1:19 PM

## 2018-05-07 NOTE — Evaluation (Signed)
Physical Therapy Evaluation Patient Details Name: Jesse [name] MRN: [id number] DOB: 11-23-1927 Today's Date: 05/07/2018   History of Present Illness  Jesse [name] is an 84 y.o. male medical history of CAD status post CABG, AAA status post repair presents with dizziness, hypotension, ABLA,abdominal discomfort .Found to have [place].  Clinical Impression  The  Patient presents with increased weakness and does not appear near his baseline independent level. Patient reports that  His son wants patient to stay  With the son. Patient will benefit from having caregivers initially.     Follow Up Recommendations No PT follow up    Equipment Recommendations  None recommended by PT    Recommendations for Other Services       Precautions / Restrictions Precautions Precautions: Fall      Mobility  Bed Mobility               General bed mobility comments: in recliner  Transfers Overall transfer level: Needs assistance Equipment used: None Transfers: Sit to/from Stand Sit to Stand: Supervision         General transfer comment: extra time to rise  Ambulation/Gait Ambulation/Gait assistance: Min guard Gait Distance (Feet): 300 Feet Assistive device: None Gait Pattern/deviations: Step-through pattern;Drifts right/left     General Gait Details: patient is noted to sway at times as patient ambulated forther, patient improved with  blanace. patient does endorse that he feels weak.   Stairs            Wheelchair Mobility    Modified Rankin (Stroke Patients Only)       Balance Overall balance assessment: Needs assistance   Sitting balance-Leahy Scale: Normal     Standing balance support: During functional activity;No upper extremity supported Standing balance-Leahy Scale: Fair                               Pertinent Vitals/Pain Pain Assessment: No/denies pain    Home Living Family/patient expects to be discharged to:: Private residence Living  Arrangements: Alone Available Help at Discharge: Family Type of Home: House Home Access: Stairs to enter Entrance Stairs-Rails: Right;Left;Can reach both Technical brewer of Steps: 3 Home Layout: Able to live on main level with bedroom/bathroom Home Equipment: Kasandra Knudsen - single point Additional Comments: patient states son wants patiento stay with son after DC., no steps    Prior Function                 Hand Dominance        Extremity/Trunk Assessment   Upper Extremity Assessment Upper Extremity Assessment: Generalized weakness    Lower Extremity Assessment Lower Extremity Assessment: Generalized weakness    Cervical / Trunk Assessment Cervical / Trunk Assessment: Kyphotic  Communication      Cognition Arousal/Alertness: Awake/alert Behavior During Therapy: WFL for tasks assessed/performed Overall Cognitive Status: No family/caregiver present to determine baseline cognitive functioning                                 General Comments: patient reports being "fuzzy" and not able to ythink like I used to.      General Comments      Exercises     Assessment/Plan    PT Assessment Patient needs continued PT services  PT Problem List Decreased strength;Decreased activity tolerance;Decreased mobility;Decreased knowledge of precautions;Decreased safety awareness;Decreased knowledge of use of DME  PT Treatment Interventions DME instruction;Gait training;Functional mobility training;Therapeutic activities;Patient/family education;Therapeutic exercise    PT Goals (Current goals can be found in the Care Plan section)  Acute Rehab PT Goals Patient Stated Goal: to go home PT Goal Formulation: With patient Time For Goal Achievement: 05/21/18 Potential to Achieve Goals: Good    Frequency Min 3X/week   Barriers to discharge Decreased caregiver support recommed that patient have family    Co-evaluation               AM-PAC PT "6  Clicks" Daily Activity  Outcome Measure Difficulty turning over in bed (including adjusting bedclothes, sheets and blankets)?: None Difficulty moving from lying on back to sitting on the side of the bed? : None Difficulty sitting down on and standing up from a chair with arms (e.g., wheelchair, bedside commode, etc,.)?: A Little Help needed moving to and from a bed to chair (including a wheelchair)?: A Little Help needed walking in hospital room?: A Little Help needed climbing 3-5 steps with a railing? : Total 6 Click Score: 18    End of Session Equipment Utilized During Treatment: Gait belt Activity Tolerance: Patient tolerated treatment well Patient left: in chair;with call bell/phone within reach;with chair alarm set Nurse Communication: Mobility status PT Visit Diagnosis: Unsteadiness on feet (R26.81)    Time: 1152-1207 PT Time Calculation (min) (ACUTE ONLY): 15 min   Charges:   PT Evaluation $PT Eval Low Complexity: [place] Pager [phone] Office [phone]   [name] 05/07/2018, 1:40 PM

## 2018-05-07 NOTE — Progress Notes (Signed)
[facility] PROGRESS NOTE    Progress Note  [name]  NWG:[id number] DOB: 09-13-1927 DOA: 05/04/2018 PCP: [name], MD     Brief Narrative:   Jesse [name] is an 84 y.o. male medical history of CAD status post CABG, AAA status post repair presents with 3 days of abdominal discomfort he has been taking Alka-Seltzer over the past week but denies any other NSAIDs denies smoking or alcohol abuse.  Assessment/Plan:   Melena/GI bleed/ Duodenal ulcer with hemorrhage/Hemorrhagic shock [facility]): Likely due to NSAIDs open (alka ztser has aspirin.) Status post 4 units of packed red blood cells, as of 05/06/2018. Status post endoscopy on 05/04/2018 that showed Schatzki ring 2 cm hiatal hernia and an oozing duodenal ulcer that was treated. Hemoglobin is stable ranging from 9.7-9.3 Change Protonix to oral transfer to MedSurg Physical therapy consult.  Essential hypertension: Hypotension now resolved. Resume Coreg.  AKI (acute kidney injury) ([place]) With a baseline creatinine of less than 1 on admission 1.2.    DVT prophylaxis: SCD Family Communication:none Disposition Plan/Barrier to D/C: Transfer to MedSurg Code Status:     Code Status Orders  (From admission, onward)         Start     Ordered   05/04/18 1444  Do not attempt resuscitation (DNR)  Continuous    Question Answer Comment  In the event of cardiac or respiratory ARREST Do not call a "code blue"   In the event of cardiac or respiratory ARREST Do not perform Intubation, CPR, defibrillation or ACLS   In the event of cardiac or respiratory ARREST Use medication by any route, position, wound care, and other measures to relive pain and suffering. May use oxygen, suction and manual treatment of airway obstruction as needed for comfort.      05/04/18 1443        Code Status History    Date Active Date Inactive Code Status Order ID Comments User Context   06/09/2015 1037 06/11/2015 1711 Full Code [id number]  Herbie Saxon Inpatient   05/07/2012 1400 05/09/2012 1556 Full Code [id number]  Jolyne Loa, RN Inpatient   03/19/2012 1157 03/21/2012 1901 Full Code [id number]  Johnsie Cancel, RN Inpatient    Advance Directive Documentation     Most Recent Value  Type of Advance Directive  Healthcare Power of Attorney, Living will  Pre-existing out of facility DNR order (yellow form or pink MOST form)  -  "MOST" Form in Place?  -        IV Access:    Peripheral IV   Procedures and diagnostic studies:   No results found.   Medical Consultants:    None.  Anti-Infectives:     Subjective:    [name] no abdominal tenderness tolerating his diet, no melanotic school hematochezia  Objective:    Vitals:   05/07/18 0300 05/07/18 0355 05/07/18 0400 05/07/18 0443  BP:   122/60   Pulse: 66  66   Resp: 16  17   Temp:  99.2 F (37.3 C)    TempSrc:  Oral    SpO2: 94%  95%   Weight:    85.7 kg  Height:        Intake/Output Summary (Last 24 hours) at 05/07/2018 0715 Last data filed at 05/07/2018 0600 Gross per 24 hour  Intake 1765.49 ml  Output 1400 ml  Net 365.49 ml   Filed Weights   05/04/18 1200 05/06/18 0542 05/07/18 0443  Weight: 82  kg 86.1 kg 85.7 kg    Exam: General exam: In no acute distress. Respiratory system: Good air movement and clear to auscultation. Cardiovascular system: S1 & S2 heard, RRR.  Gastrointestinal system: Abdomen is soft nontender nondistended Central nervous system: Alert and oriented. No focal neurological deficits. Extremities: No pedal edema. Skin: No rashes, lesions or ulcers Psychiatry: Judgement and insight appear normal. Mood & affect appropriate.    Data Reviewed:    Labs: Basic Metabolic Panel: Recent Labs  Lab 05/04/18 0506 05/05/18 0711  NA 146* 145  K 3.7 3.7  CL 111 115*  CO2 25 24  GLUCOSE 148* 99  BUN 66* 49*  CREATININE 1.24 1.00  CALCIUM 9.3 8.5*   GFR Estimated Creatinine Clearance: 56 mL/min (by C-G  formula based on SCr of 1 mg/dL). Liver Function Tests: Recent Labs  Lab 05/04/18 0506 05/05/18 0711  AST 16 18  ALT 11 11  ALKPHOS 28* 26*  BILITOT 0.8 1.0  PROT 4.7* 4.5*  ALBUMIN 2.8* 2.7*   Recent Labs  Lab 05/04/18 0506  LIPASE 41   No results for input(s): AMMONIA in the last 168 hours. Coagulation profile No results for input(s): INR, PROTIME in the last 168 hours.  CBC: Recent Labs  Lab 05/04/18 0507  05/05/18 0711  05/06/18 0102 05/06/18 0648 05/06/18 1314 05/06/18 2000 05/07/18 0118  WBC 8.0  --  7.5  --   --   --   --   --   --   NEUTROABS 6.7  --   --   --   --   --   --   --   --   HGB 7.6*   < > 7.4*   < > 7.8* 7.9* 9.7* 8.9* 9.3*  HCT 24.4*   < > 23.5*   < > 24.2* 24.7* 30.2* 28.1* 28.9*  MCV 93.8  --  94.4  --   --   --   --   --   --   PLT 193  --  136*  --   --   --   --   --   --    < > = values in this interval not displayed.   Cardiac Enzymes: No results for input(s): CKTOTAL, CKMB, CKMBINDEX, TROPONINI in the last 168 hours. BNP (last 3 results) No results for input(s): PROBNP in the last 8760 hours. CBG: Recent Labs  Lab 05/04/18 0447  GLUCAP 131*   D-Dimer: No results for input(s): DDIMER in the last 72 hours. Hgb A1c: No results for input(s): HGBA1C in the last 72 hours. Lipid Profile: No results for input(s): CHOL, HDL, LDLCALC, TRIG, CHOLHDL, LDLDIRECT in the last 72 hours. Thyroid function studies: No results for input(s): TSH, T4TOTAL, T3FREE, THYROIDAB in the last 72 hours.  Invalid input(s): FREET3 Anemia work up: No results for input(s): VITAMINB12, FOLATE, FERRITIN, TIBC, IRON, RETICCTPCT in the last 72 hours. Sepsis Labs: Recent Labs  Lab 05/04/18 0507 05/05/18 0711  WBC 8.0 7.5   Microbiology Recent Results (from the past 240 hour(s))  MRSA PCR Screening     Status: None   Collection Time: 05/04/18  2:22 PM  Result Value Ref Range Status   MRSA by PCR NEGATIVE NEGATIVE Final    Comment:        The GeneXpert  MRSA Assay (FDA approved for NASAL specimens only), is one component of a comprehensive MRSA colonization surveillance program. It is not intended to diagnose MRSA infection nor to guide or  monitor treatment for MRSA infections. Performed at [facility], [place] [address] [place], [place] [address]      Medications:   . sodium chloride   Intravenous Once  . carvedilol  12.5 mg Oral BID WC  . pantoprazole  40 mg Oral BID  . pantoprazole  40 mg Intravenous Q12H   Continuous Infusions: . sodium chloride        LOS: 3 days   Charlynne Cousins  [facility] Pager [phone]  *Please refer to [email or web address], password [email or web address] to get updated schedule on who will round on this patient, as hospitalists switch teams weekly. If 7PM-7AM, please contact night-coverage at [email or web address], password [email or web address] for any overnight needs.  05/07/2018, 7:15 AM

## 2018-05-08 MED ORDER — POLYETHYLENE GLYCOL 3350 17 G PO PACK: 17.0000 g | PACK | Freq: Every day | ORAL | 0 refills | Status: DC

## 2018-05-08 MED ORDER — POLYETHYLENE GLYCOL 3350 17 G PO PACK: 17.0000 g | PACK | Freq: Every day | ORAL | Status: DC

## 2018-05-08 MED ORDER — PANTOPRAZOLE SODIUM 40 MG PO TBEC: 40.0000 mg | DELAYED_RELEASE_TABLET | Freq: Two times a day (BID) | ORAL | 0 refills | Status: DC

## 2018-05-08 NOTE — Progress Notes (Signed)
Physical Therapy Treatment Patient Details Name: Jesse [name] MRN: [id number] DOB: [birth year] Today's Date: 05/08/2018    History of Present Illness Jesse [name] is an 84 y.o. male medical history of CAD status post CABG, AAA status post repair presents with dizziness, hypotension, ABLA,abdominal discomfort .Found to have [place].    PT Comments    Pt ambulated 200' without an assistive device, no loss of balance. Pt is active at baseline, he walks ~2 miles 2-3x/week at a park. Encouraged pt to gradually return to baseline with activity.   PT goals mets, will sign off.   Follow Up Recommendations  No PT follow up     Equipment Recommendations  None recommended by PT    Recommendations for Other Services       Precautions / Restrictions Precautions Precautions: None Precaution Comments: denies falls in past 1 year Restrictions Weight Bearing Restrictions: No    Mobility  Bed Mobility Overal bed mobility: Independent             General bed mobility comments: in recliner  Transfers Overall transfer level: Independent Equipment used: None Transfers: Sit to/from Stand Sit to Stand: Independent         General transfer comment: no assist needed  Ambulation/Gait Ambulation/Gait assistance: Independent Gait Distance (Feet): 200 Feet Assistive device: None Gait Pattern/deviations: Step-through pattern;Drifts right/left Gait velocity: WFL   General Gait Details: steady, no loss of balance   Stairs             Wheelchair Mobility    Modified Rankin (Stroke Patients Only)       Balance     Sitting balance-Leahy Scale: Normal     Standing balance support: During functional activity;No upper extremity supported Standing balance-Leahy Scale: Good                              Cognition Arousal/Alertness: Awake/alert Behavior During Therapy: WFL for tasks assessed/performed Overall Cognitive Status: Within Functional Limits for  tasks assessed                                        Exercises      General Comments        Pertinent Vitals/Pain Pain Assessment: No/denies pain    Home Living                      Prior Function            PT Goals (current goals can now be found in the care plan section) Acute Rehab PT Goals Patient Stated Goal: [place] go [place], [place] PT Goal Formulation: With patient Time For Goal Achievement: 05/21/18 Potential to Achieve Goals: Good Progress towards PT goals: Goals met/education completed, patient discharged from PT    Frequency    Min 3X/week      PT Plan Current plan remains appropriate    Co-evaluation              AM-PAC PT "6 Clicks" Daily Activity  Outcome Measure  Difficulty turning over in bed (including adjusting bedclothes, sheets and blankets)?: None Difficulty moving from lying on back to sitting on the side of the bed? : None Difficulty sitting down on and standing up from a chair with arms (e.g., wheelchair, bedside commode, etc,.)?: None Help needed moving to  and from a bed to chair (including a wheelchair)?: None Help needed walking in hospital room?: None Help needed climbing 3-5 steps with a railing? : A Little 6 Click Score: 23    End of Session Equipment Utilized During Treatment: Gait belt Activity Tolerance: Patient tolerated treatment well Patient left: in chair;with call bell/phone within reach;with chair alarm set Nurse Communication: Mobility status PT Visit Diagnosis: Unsteadiness on feet (R26.81)     Time: 2500-3704 PT Time Calculation (min) (ACUTE ONLY): 14 min  Charges:  $Gait Training: 8-22 mins                     Blondell Reveal Kistler PT 05/08/2018  Acute Rehabilitation Services Pager [phone] Office [phone]

## 2018-05-08 NOTE — Discharge Summary (Signed)
Physician Discharge Summary  Jesse [name] OZD:[id number] DOB: January 28, 1928 DOA: 05/04/2018  PCP: [name], MD  Admit date: 05/04/2018 Discharge date: 05/08/2018  Admitted From: home Disposition:  Home  Recommendations for Outpatient Follow-up:  1. Follow up with PCP in 1-2 weeks 2. Please obtain BMP/CBC in one week   Home Health:No Equipment/Devices:none  Discharge Condition:stable CODE STATUS:full Diet recommendation: Heart Healthy  Brief/Interim Summary: 84 y.o. male medical history of CAD status post CABG, AAA status post repair presents with 3 days of abdominal discomfort he has been taking Alka-Seltzer over the past week but denies any other NSAIDs denies smoking or alcohol abuse.  Discharge Diagnoses:  Principal Problem:   Symptomatic anemia Active Problems:   Essential hypertension   Orthostatic hypotension   Melena   GI bleed   Duodenal ulcer with hemorrhage   Hemorrhagic shock (HCC)   AKI (acute kidney injury) ([place])  Melena/GI bleed due to a duodenal ulcer with hemorrhage and hemorrhagic shock: Likely due to NSAIDs. He status post 4 units of packed red blood cells he was fluid resuscitated. GI was consulted recommended an endoscopy performed 05/04/2018 that showed Schatzki ring hiatal hernia and an oozing duodenal ulcer which was treated. He was also started on Protonix on admission which we have changed to oral which she will continue with twice a day as an outpatient follow-up with GI in 2 weeks to follow-up on pathology results.  Essential hypertension: Antihypertensive medications were held in admission no changes were made will resume as an outpatient.  Acute kidney injury: Likely due to hypovolemia, resolved with fluid resuscitation   Discharge Instructions  Discharge Instructions    Diet - low sodium heart healthy   Complete by:  As directed    Increase activity slowly   Complete by:  As directed      Allergies as of 05/08/2018       Reactions   Lactose Intolerance (gi) Other (See Comments)   Gas, bloating, stomach indigestion    Adhesive [tape] Itching, Rash      Medication List    STOP taking these medications   dicyclomine 20 MG tablet Commonly known as:  BENTYL     TAKE these medications   acetaminophen 325 MG tablet Commonly known as:  TYLENOL Take 650 mg by mouth every 6 (six) hours as needed for mild pain.   aspirin 81 MG tablet Take 81 mg by mouth daily.   carboxymethylcellulose 0.5 % Soln Commonly known as:  REFRESH PLUS Apply 1 drop to eye 3 (three) times daily as needed (dry eyes).   carvedilol 12.5 MG tablet Commonly known as:  COREG TAKE 1 TABLET BY MOUTH 2 TIMES DAILY WITH A MEAL What changed:  See the new instructions.   DAIRY DIGESTIVE SUPPLEMENT PO Take 1 tablet by mouth daily as needed (when eating foods containing lactose).   fenofibrate 160 MG tablet Take 1 tablet (160 mg total) by mouth daily.   GAVISCON PO Take 1 tablet by mouth daily as needed (upset stomach).   hydrochlorothiazide 12.5 MG tablet Commonly known as:  HYDRODIURIL TAKE 1 TABLET BY MOUTH DAILY GENERIC EQUIVALENT FOR HYDRODIURIL What changed:  See the new instructions.   loratadine 10 MG tablet Commonly known as:  CLARITIN Take 10 mg by mouth daily.   losartan 50 MG tablet Commonly known as:  COZAAR TAKE 1 TABLET BY MOUTH DAILY GENERIC EQUIVALENT FOR COZAAR What changed:  See the new instructions.   omeprazole 20 MG capsule Commonly known as:  PRILOSEC Take 1 capsule (20 mg total) by mouth daily.   pantoprazole 40 MG tablet Commonly known as:  PROTONIX Take 1 tablet (40 mg total) by mouth 2 (two) times daily.   polyethylene glycol packet Commonly known as:  MIRALAX / GLYCOLAX Take 17 g by mouth daily.   PROBIOTIC PO Take 1 capsule by mouth daily.   simvastatin 40 MG tablet Commonly known as:  ZOCOR TAKE 1 TABLET BY MOUTH DAILY AT 6PM. Please keep upcoming appt in October for future refills.  Thank you       Allergies  Allergen Reactions  . Lactose Intolerance (Gi) Other (See Comments)    Gas, bloating, stomach indigestion   . Adhesive [Tape] Itching and Rash    Consultations:  Gastroenterology   Procedures/Studies: Ct Abdomen Pelvis W Contrast  Result Date: 05/04/2018 CLINICAL DATA:  Generalized abdominal pain and hypotension EXAM: CT ABDOMEN AND PELVIS WITH CONTRAST TECHNIQUE: Multidetector CT imaging of the abdomen and pelvis was performed using the standard protocol following bolus administration of intravenous contrast. CONTRAST:  51mL ISOVUE-300 IOPAMIDOL (ISOVUE-300) INJECTION 61% COMPARISON:  May 18, 2016 FINDINGS: Lower chest: There is fibrotic change in the lung bases. There is atelectatic change in the inferior most aspect of the lingula. There are multiple foci coronary artery calcification. The distal descending thoracic aorta appears somewhat ectatic. Hepatobiliary: No focal liver lesions are evident. There is cholelithiasis. There is no gallbladder wall thickening or pericholecystic fluid. There is no biliary duct dilatation. Pancreas: No pancreatic mass or inflammatory focus evident. Spleen: No splenic lesions are appreciable. Adrenals/Urinary Tract: Adrenals bilaterally appear unremarkable. There is a cyst in the mid right kidney measuring 3.8 x 3.2 cm. There is a 1 x 1 cm cyst in the medial lower pole right kidney. There is a 1 x 1 cm cyst in the mid left kidney. There is no hydronephrosis on either side. There is no renal or ureteral calculus on either side. Urinary bladder is midline with wall thickness within normal limits. Stomach/Bowel: There is no appreciable bowel wall or mesenteric thickening. There is no evident bowel obstruction. There is no free air or portal venous air. Vascular/Lymphatic: There is extensive aortic atherosclerosis. There is dilatation of the distal abdominal aorta with a maximum transverse diameter of 4.0 x 3.9 cm, essentially stable  from prior study. There is extensive thrombus in the leftward aspect of this area of aneurysmal dilatation. Patient has had aortobifemoral grafting. There is retrograde filling of an aneurysm arising from the internal iliac artery on the right. This aneurysm measures 7.3 x 6.9 cm, larger than on prior study. There is also aneurysmal dilatation in the left internal iliac region measuring 5.4 x 4.7 cm. There is moderate thrombus in this aneurysm. This aneurysm also is larger than on the previous study. There is an aneurysm arising from the lateral right common femoral artery measuring 2.4 x 1.6 cm, similar to prior study. This aneurysm is largely thrombosed. Note that there are embolization coils in the proximal internal iliac arteries bilaterally. Major venous structures appear patent. There is no evident adenopathy in the abdomen or pelvis. Reproductive: There are presumed seed implants along the periphery of the prostate. Prostate and seminal vesicles are normal in size and contour. Other: Appendix appears normal. No abscess or ascites is evident in the abdomen or pelvis. Musculoskeletal: There is degenerative change in the lumbar spine. Bones are osteoporotic. There are no blastic or lytic bone lesions. There are no intramuscular lesions. IMPRESSION: 1. Previous aortobifemoral grafting  with coils in the internal iliac arteries. There is aneurysmal dilatation of the distal abdominal aorta measuring 4.0 x 3.9 cm, stable from prior study with thrombus in the aorta. 2. 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 This filling is more prominent on the right than on previous study. Given enlargement of these aneurysms since previous study, vascular surgery consultation is felt to be advisable. Note that there is a smaller largely thrombosed aneurysm arising from the lateral right femoral artery measuring 2.4 x 1.6 cm. 3. Post radiation therapy  change in the prostate region. Prostate not enlarged. 4.  Cholelithiasis.  No gallbladder wall thickening evident by CT. 5. No bowel obstruction. No abscess in the abdomen or pelvis. Appendix region appears normal. 6.  Fibrotic change in lung bases. Electronically Signed   By: Lowella Grip III M.D.   On: 05/04/2018 08:53   Endoscopy with results as above.   Subjective: No complaints feels great tolerating diet.  Discharge Exam: Vitals:   05/08/18 0359 05/08/18 0848  BP: 128/75 (!) 111/95  Pulse: (!) 59 68  Resp: 20   Temp: 97.8 F (36.6 C)   SpO2: 96% 99%   Vitals:   05/07/18 1522 05/07/18 2006 05/08/18 0359 05/08/18 0848  BP: 122/72 116/79 128/75 (!) 111/95  Pulse: 63 65 (!) 59 68  Resp: 14 18 20    Temp: 98.2 F (36.8 C) 98.8 F (37.1 C) 97.8 F (36.6 C)   TempSrc: Oral Oral Oral   SpO2: 100% 98% 96% 99%  Weight:   84.3 kg   Height:        General: Pt is alert, awake, not in acute distress Cardiovascular: RRR, S1/S2 +, no rubs, no gallops Respiratory: CTA bilaterally, no wheezing, no rhonchi Abdominal: Soft, NT, ND, bowel sounds + Extremities: no edema, no cyanosis    The results of significant diagnostics from this hospitalization (including imaging, microbiology, ancillary and laboratory) are listed below for reference.     Microbiology: Recent Results (from the past 240 hour(s))  MRSA PCR Screening     Status: None   Collection Time: 05/04/18  2:22 PM  Result Value Ref Range Status   MRSA by PCR NEGATIVE NEGATIVE Final    Comment:        The GeneXpert MRSA Assay (FDA approved for NASAL specimens only), is one component of a comprehensive MRSA colonization surveillance program. It is not intended to diagnose MRSA infection nor to guide or monitor treatment for MRSA infections. Performed at [facility], [place] [address] [place], [place] [address]      Labs: BNP (last 3 results) No results for input(s): BNP in the last 8760  hours. Basic Metabolic Panel: Recent Labs  Lab 05/04/18 0506 05/05/18 0711  NA 146* 145  K 3.7 3.7  CL 111 115*  CO2 25 24  GLUCOSE 148* 99  BUN 66* 49*  CREATININE 1.24 1.00  CALCIUM 9.3 8.5*   Liver Function Tests: Recent Labs  Lab 05/04/18 0506 05/05/18 0711  AST 16 18  ALT 11 11  ALKPHOS 28* 26*  BILITOT 0.8 1.0  PROT 4.7* 4.5*  ALBUMIN 2.8* 2.7*   Recent Labs  Lab 05/04/18 0506  LIPASE 41   No results for input(s): AMMONIA in the last 168 hours. CBC: Recent Labs  Lab 05/04/18 0507  05/05/18 0711  05/06/18 0648 05/06/18 1314 05/06/18 2000 05/07/18 0118 05/07/18 0704  WBC 8.0  --  7.5  --   --   --   --   --   --   NEUTROABS 6.7  --   --   --   --   --   --   --   --   HGB 7.6*   < > 7.4*   < > 7.9* 9.7* 8.9* 9.3* 8.7*  HCT 24.4*   < > 23.5*   < > 24.7* 30.2* 28.1* 28.9* 26.8*  MCV 93.8  --  94.4  --   --   --   --   --   --   PLT 193  --  136*  --   --   --   --   --   --    < > = values in this interval not displayed.   Cardiac Enzymes: No results for input(s): CKTOTAL, CKMB, CKMBINDEX, TROPONINI in the last 168 hours. BNP: Invalid input(s): POCBNP CBG: Recent Labs  Lab 05/04/18 0447  GLUCAP 131*   D-Dimer No results for input(s): DDIMER in the last 72 hours. Hgb A1c No results for input(s): HGBA1C in the last 72 hours. Lipid Profile No results for input(s): CHOL, HDL, LDLCALC, TRIG, CHOLHDL, LDLDIRECT in the last 72 hours. Thyroid function studies No results for input(s): TSH, T4TOTAL, T3FREE, THYROIDAB in the last 72 hours.  Invalid input(s): FREET3 Anemia work up No results for input(s): VITAMINB12, FOLATE, FERRITIN, TIBC, IRON, RETICCTPCT in the last 72 hours. Urinalysis    Component Value Date/Time   COLORURINE YELLOW 05/04/2018 2140   APPEARANCEUR CLEAR 05/04/2018 2140   LABSPEC 1.028 05/04/2018 2140   PHURINE 6.0 05/04/2018 2140   GLUCOSEU NEGATIVE 05/04/2018 2140   [place] NEGATIVE 05/04/2018 2140   BILIRUBINUR NEGATIVE  05/04/2018 2140   KETONESUR NEGATIVE 05/04/2018 2140   PROTEINUR NEGATIVE 05/04/2018 2140   UROBILINOGEN 2.0 (H) 05/28/2015 0029   NITRITE NEGATIVE 05/04/2018 2140   LEUKOCYTESUR NEGATIVE 05/04/2018 2140   Sepsis Labs Invalid input(s): PROCALCITONIN,  WBC,  LACTICIDVEN Microbiology Recent Results (from the past 240 hour(s))  MRSA PCR Screening     Status: None   Collection Time: 05/04/18  2:22 PM  Result Value Ref Range Status   MRSA by PCR NEGATIVE NEGATIVE Final    Comment:        The GeneXpert MRSA Assay (FDA approved for NASAL specimens only), is one component of a comprehensive MRSA colonization surveillance program. It is not intended to diagnose MRSA infection nor to guide or monitor treatment for MRSA infections. Performed at [facility], [place] [address], [place], [place] [address]      Time coordinating discharge: Over 40 minutes  SIGNED:   Charlynne Cousins, MD  [facility] 05/08/2018, 10:57 AM Pager   If 7PM-7AM, please contact night-coverage [email or web address] Password [email or web address]

## 2018-05-14 NOTE — Anesthesia Postprocedure Evaluation (Signed)
Anesthesia Post Note  Patient: Jesse [name]  Procedure(s) Performed: ESOPHAGOGASTRODUODENOSCOPY (EGD) WITH PROPOFOL (N/A ) HOT HEMOSTASIS (ARGON PLASMA COAGULATION/BICAP) (N/A ) SUBMUCOSAL INJECTION     Patient location during evaluation: PACU Anesthesia Type: General Level of consciousness: awake and alert Pain management: pain level controlled Vital Signs Assessment: post-procedure vital signs reviewed and stable Respiratory status: spontaneous breathing, nonlabored ventilation, respiratory function stable and patient connected to nasal cannula oxygen Cardiovascular status: blood pressure returned to baseline and stable Postop Assessment: no apparent nausea or vomiting Anesthetic complications: no    Last Vitals:  Vitals:   05/08/18 0359 05/08/18 0848  BP: 128/75 (!) 111/95  Pulse: (!) 59 68  Resp: 20   Temp: 36.6 C   SpO2: 96% 99%    Last Pain:  Vitals:   05/08/18 0848  TempSrc:   PainSc: 0-No pain                 Jassmin Kemmerer

## 2018-05-15 DIAGNOSIS — Z8679 Personal history of other diseases of the circulatory system: Secondary | ICD-10-CM | POA: Diagnosis not present

## 2018-05-15 DIAGNOSIS — Z6825 Body mass index (BMI) 25.0-25.9, adult: Secondary | ICD-10-CM | POA: Diagnosis not present

## 2018-05-15 DIAGNOSIS — K269 Duodenal ulcer, unspecified as acute or chronic, without hemorrhage or perforation: Secondary | ICD-10-CM | POA: Diagnosis not present

## 2018-05-16 DIAGNOSIS — K269 Duodenal ulcer, unspecified as acute or chronic, without hemorrhage or perforation: Secondary | ICD-10-CM | POA: Diagnosis not present

## 2018-05-21 ENCOUNTER — Ambulatory Visit: Payer: Medicare Other | Admitting: Cardiovascular Disease

## 2018-05-21 ENCOUNTER — Encounter: Payer: Self-pay | Admitting: Cardiovascular Disease

## 2018-05-21 VITALS — BP 108/60 | HR 56 | Ht 72.0 in | Wt 185.8 lb

## 2018-05-21 DIAGNOSIS — I10 Essential (primary) hypertension: Secondary | ICD-10-CM | POA: Diagnosis not present

## 2018-05-21 DIAGNOSIS — I5022 Chronic systolic (congestive) heart failure: Secondary | ICD-10-CM

## 2018-05-21 DIAGNOSIS — E782 Mixed hyperlipidemia: Secondary | ICD-10-CM | POA: Diagnosis not present

## 2018-05-21 DIAGNOSIS — I2510 Atherosclerotic heart disease of native coronary artery without angina pectoris: Secondary | ICD-10-CM | POA: Diagnosis not present

## 2018-05-21 MED ORDER — SIMVASTATIN 40 MG PO TABS: 40.0000 mg | ORAL_TABLET | Freq: Every day | ORAL | 3 refills | Status: DC

## 2018-05-21 NOTE — Progress Notes (Signed)
Cardiology Office Note:    Date:  05/21/2018   ID:  Jesse [name], DOB March 16, 1928, MRN [id number]  PCP:  [name], MD  Cardiologist:  [name], MD  Electrophysiologist:  None   Referring MD: [name], MD   Chief Complaint  Patient presents with  . Coronary Artery Disease    History of Present Illness:    Jesse [name] is a 84 y.o. male with a hx of coronary artery disease with remote CABG in 1992.  The patient has diffuse aneurysmal disease and is followed by Dr. Donnetta Hutching.  He has been noted to have a thoracic aortic aneurysm, iliac aneurysms, and popliteal artery aneurysms.  Conservative management has been recommended.  The patient was recently hospitalized with an upper GI bleed.  His hemoglobin was as low as 7.1.  He received multiple packed red blood cell transfusions and was found to have a duodenal ulcer, receiving endoscopic treatment.  States that he is still recovering with some generalized weakness but overall is doing okay.  Even with his hemoglobin at 7, he had no symptoms of chest pain or pressure.  He presented with near syncope.  He has mild shortness of breath with activity which is unchanged over time.  He walks with a walking stick to help stabilize him.  He is able to walk about 2 miles on a regular basis.  He complains of left leg swelling which is chronic.  Vein graft harvesting involved the left leg back at the time of his coronary bypass surgery.  He denies orthopnea or PND.  Past Medical History:  Diagnosis Date  . AAA (abdominal aortic aneurysm) ([place])   . Arthritis   . Bronchitis    hx of appro 40 years ago  . Cancer ([place]) 2010   prostate ( 40 Txs. of radiation treatments)  . Coronary artery disease   . Environmental allergies   . GERD (gastroesophageal reflux disease)   . HOH (hard of hearing)    wears bilateral hearing aids  . Hyperlipidemia   . Hypertension   . Kidney stone 1968  . Myocardial infarction ([place]) 1992  . Pneumonia    hx of    . RBBB     Past Surgical History:  Procedure Laterality Date  . ABDOMINAL AORTIC ANEURYSM REPAIR  08-13-1993   Dr Cameron Sprang  . CARDIAC CATHETERIZATION    . COLONOSCOPY W/ POLYPECTOMY    . CORONARY ARTERY BYPASS GRAFT  03-01-1991   Dr. Servando Snare  . ESOPHAGOGASTRODUODENOSCOPY (EGD) WITH PROPOFOL N/A 05/04/2018   Procedure: ESOPHAGOGASTRODUODENOSCOPY (EGD) WITH PROPOFOL;  Surgeon: [name], MD;  Location: [facility];  Service: Gastroenterology;  Laterality: N/A;  . EYE SURGERY     Detached retina (Left eye); bilateral cataract removal  . FALSE ANEURYSM REPAIR  05/07/2012   Procedure: REPAIR FALSE ANEURYSM;  Surgeon: Rosetta Posner, MD;  Location: [facility] OR;  Service: Vascular;  Laterality: Left;  Repair of Left Femoral Artery Aneurysm  . HERNIA REPAIR    . HOT HEMOSTASIS N/A 05/04/2018   Procedure: HOT HEMOSTASIS (ARGON PLASMA COAGULATION/BICAP);  Surgeon: [name], MD;  Location: [facility];  Service: Gastroenterology;  Laterality: N/A;  . ORIF PATELLA Right 06/09/2015   Procedure: OPEN REDUCTION INTERNAL (ORIF) FIXATION PATELLA;  Surgeon: Marybelle Killings, MD;  Location: [facility];  Service: Orthopedics;  Laterality: Right;  . repair of bowel obstruction  1999   Dr. Dalbert Batman  . repair of ventral hernia  04-20-1994   P. Cameron Sprang MD  .  resection femoral artery  03/19/2012   Dr. [name] Early  . SUBMUCOSAL INJECTION  05/04/2018   Procedure: SUBMUCOSAL INJECTION;  Surgeon: [name], MD;  Location: [facility];  Service: Gastroenterology;;    Current Medications: Current Meds  Medication Sig  . acetaminophen (TYLENOL) 325 MG tablet Take 650 mg by mouth every 6 (six) hours as needed for mild pain.  Marland Kitchen Alum Hydroxide-Mag Carbonate (GAVISCON PO) Take 1 tablet by mouth daily as needed (upset stomach).   Marland Kitchen aspirin 81 MG tablet Take 81 mg by mouth daily.  . carboxymethylcellulose (REFRESH PLUS) 0.5 % SOLN Apply 1 drop to eye 3 (three) times daily as needed (dry eyes).   . carvedilol  (COREG) 12.5 MG tablet TAKE 1 TABLET BY MOUTH 2 TIMES DAILY WITH A MEAL (Patient taking differently: Take 12.5 mg by mouth 2 (two) times daily with a meal. )  . fenofibrate 160 MG tablet Take 1 tablet (160 mg total) by mouth daily.  . hydrochlorothiazide (HYDRODIURIL) 12.5 MG tablet TAKE 1 TABLET BY MOUTH DAILY GENERIC EQUIVALENT FOR HYDRODIURIL (Patient taking differently: Take 12.5 mg by mouth daily. )  . Lactase (DAIRY DIGESTIVE SUPPLEMENT PO) Take 1 tablet by mouth daily as needed (when eating foods containing lactose).   Marland Kitchen loratadine (CLARITIN) 10 MG tablet Take 10 mg by mouth daily.   Marland Kitchen losartan (COZAAR) 50 MG tablet TAKE 1 TABLET BY MOUTH DAILY GENERIC EQUIVALENT FOR COZAAR (Patient taking differently: Take 50 mg by mouth daily. )  . omeprazole (PRILOSEC) 20 MG capsule Take 1 capsule (20 mg total) by mouth daily.  . pantoprazole (PROTONIX) 40 MG tablet Take 1 tablet (40 mg total) by mouth 2 (two) times daily.  . polyethylene glycol (MIRALAX / GLYCOLAX) packet Take 17 g by mouth daily.  . Probiotic Product (PROBIOTIC PO) Take 1 capsule by mouth daily.  . [DISCONTINUED] simvastatin (ZOCOR) 40 MG tablet TAKE 1 TABLET BY MOUTH DAILY AT 6PM. Please keep upcoming appt in October for future refills. Thank you     Allergies:   Lactose intolerance (gi) and Adhesive [tape]   Social History   Socioeconomic History  . Marital status: Widowed    Spouse name: Not on file  . Number of children: Not on file  . Years of education: Not on file  . Highest education level: Not on file  Occupational History  . Not on file  Social Needs  . Financial resource strain: Not on file  . Food insecurity:    Worry: Not on file    Inability: Not on file  . Transportation needs:    Medical: Not on file    Non-medical: Not on file  Tobacco Use  . Smoking status: Former Smoker    Years: 20.00    Types: Cigarettes    Last attempt to quit: 07/25/1968    Years since quitting: 49.8  . Smokeless tobacco: Never  Used  Substance and Sexual Activity  . Alcohol use: No  . Drug use: No  . Sexual activity: Not on file  Lifestyle  . Physical activity:    Days per week: Not on file    Minutes per session: Not on file  . Stress: Not on file  Relationships  . Social connections:    Talks on phone: Not on file    Gets together: Not on file    Attends religious service: Not on file    Active member of club or organization: Not on file    Attends meetings of clubs  or organizations: Not on file    Relationship status: Not on file  Other Topics Concern  . Not on file  Social History Narrative  . Not on file     Family History: The patient's family history includes Cancer in his father, mother, and sister; Diabetes in his sister; Heart attack in his sister; Varicose Veins in his father.  ROS:   Please see the history of present illness.    Positive for hearing loss, DOE, abdominal pain, blood in stool, diarrhea. All other systems reviewed and are negative.  EKGs/Labs/Other Studies Reviewed:    The following studies were reviewed today: Echo 2017: Study Conclusions  - Left ventricle: Inferior , lateral and septal hypokinesis The   cavity size was moderately dilated. There was mild focal basal   hypertrophy of the septum. Systolic function was mildly to   moderately reduced. The estimated ejection fraction was in the   range of 40% to 45%. Wall motion was normal; there were no   regional wall motion abnormalities. Doppler parameters are   consistent with abnormal left ventricular relaxation (grade 1   diastolic dysfunction). - Left atrium: The atrium was moderately dilated. - Atrial septum: No defect or patent foramen ovale was identified.  EKG:  EKG is not ordered today.    Recent Labs: 05/05/2018: ALT 11; BUN 49; Creatinine, Ser 1.00; Platelets 136; Potassium 3.7; Sodium 145 05/07/2018: Hemoglobin 8.7  Recent Lipid Panel    Component Value Date/Time   CHOL 106 03/02/2017 0753    TRIG 57 03/02/2017 0753   TRIG 84 05/05/2006 0839   HDL 32 (L) 03/02/2017 0753   CHOLHDL 3.3 03/02/2017 0753   CHOLHDL 4 05/20/2014 0833   VLDL 12.8 05/20/2014 0833   LDLCALC 63 03/02/2017 0753    Physical Exam:    VS:  BP 108/60   Pulse (!) 56   Ht 6' (1.829 m)   Wt 185 lb 12.8 oz (84.3 kg)   SpO2 98%   BMI 25.20 kg/m     Wt Readings from Last 3 Encounters:  05/21/18 185 lb 12.8 oz (84.3 kg)  05/08/18 185 lb 13.6 oz (84.3 kg)  11/30/17 185 lb (83.9 kg)     GEN: Well nourished, well developed pleasant elderly male in no acute distress HEENT: Normal NECK: No JVD; No carotid bruits LYMPHATICS: No lymphadenopathy CARDIAC: RRR, no murmurs, rubs, gallops RESPIRATORY:  Clear to auscultation without rales, wheezing or rhonchi  ABDOMEN: Soft, non-tender, non-distended MUSCULOSKELETAL:  1+ edema left leg, trace edema right leg; No deformity  SKIN: Warm and dry NEUROLOGIC:  Alert and oriented x 3 PSYCHIATRIC:  Normal affect   ASSESSMENT:    1. Coronary artery disease involving native coronary artery of native heart without angina pectoris   2. Chronic systolic heart failure ([place])   3. Essential hypertension   4. Mixed hyperlipidemia    PLAN:    In order of problems listed above:  1. Stable without sx's of angina. Tolerating ASA 81 mg daily, beta-blocker, statin. 2. Continue carvedilol, HCT, losartan. LVEF 45% last echo 2017. Update echo. NYHA II symptoms.  3. BP well controlled on current Rx.  4. Continue statin, fenofibrate.   Overall he appears to be holding his own with some fatigue after recent UGI bleed, but starting to improve. FU HgB > 10 mg/dL checked by PCP. No changes in his cardiac regimen recommended.   Medication Adjustments/Labs and Tests Ordered: Current medicines are reviewed at length with the patient today.  Concerns  regarding medicines are outlined above.  Orders Placed This Encounter  Procedures  . ECHOCARDIOGRAM COMPLETE   Meds ordered this  encounter  Medications  . simvastatin (ZOCOR) 40 MG tablet    Sig: Take 1 tablet (40 mg total) by mouth daily at 6 PM.    Dispense:  90 tablet    Refill:  3    Patient Instructions  Medication Instructions:  Your provider recommends that you continue on your current medications as directed. Please refer to the Current Medication list given to you today.    Labwork: None  Testing/Procedures: Your provider has requested that you have an echocardiogram. Echocardiography is a painless test that uses sound waves to create images of your heart. It provides your doctor with information about the size and shape of your heart and how well your heart's chambers and valves are working. This procedure takes approximately one hour. There are no restrictions for this procedure.  Follow-Up: Your provider wants you to follow-up in: 6 months with Dr. Antionette Char assistant, Nicki Reaper. You will receive a reminder letter in the mail two months in advance. If you don't receive a letter, please call our office to schedule the follow-up appointment.    Your provider wants you to follow-up in: 1 year with Dr. Burt Knack. You will receive a reminder letter in the mail two months in advance. If you don't receive a letter, please call our office to schedule the follow-up appointment.    Any Other Special Instructions Will Be Listed Below (If Applicable).     If you need a refill on your cardiac medications before your next appointment, please call your pharmacy.      Signed, [name], MD  05/21/2018 10:31 AM    [place]

## 2018-05-21 NOTE — Patient Instructions (Addendum)
Medication Instructions:  Your provider recommends that you continue on your current medications as directed. Please refer to the Current Medication list given to you today.    Labwork: None  Testing/Procedures: Your provider has requested that you have an echocardiogram. Echocardiography is a painless test that uses sound waves to create images of your heart. It provides your doctor with information about the size and shape of your heart and how well your heart's chambers and valves are working. This procedure takes approximately one hour. There are no restrictions for this procedure. You are scheduled for your echocardiogram THIS Friday, 05/25/2018. Please arrive by 1:30PM for this appointment.   Follow-Up: You have an appointment with Dr. Antionette Char assistant, Nicki Reaper, on Tuesday, November 20, 2018. Please arrive by 2:00PM.   Your provider wants you to follow-up in: 1 year with Dr. Burt Knack. You will receive a reminder letter in the mail two months in advance. If you don't receive a letter, please call our office to schedule the follow-up appointment.    Any Other Special Instructions Will Be Listed Below (If Applicable).     If you need a refill on your cardiac medications before your next appointment, please call your pharmacy.

## 2018-05-25 ENCOUNTER — Other Ambulatory Visit: Payer: Self-pay

## 2018-05-25 ENCOUNTER — Ambulatory Visit (HOSPITAL_COMMUNITY): Payer: Medicare Other | Attending: Cardiovascular Disease

## 2018-05-25 DIAGNOSIS — I5022 Chronic systolic (congestive) heart failure: Secondary | ICD-10-CM | POA: Diagnosis not present

## 2018-06-18 DIAGNOSIS — H35431 Paving stone degeneration of retina, right eye: Secondary | ICD-10-CM | POA: Diagnosis not present

## 2018-06-18 DIAGNOSIS — H35373 Puckering of macula, bilateral: Secondary | ICD-10-CM | POA: Diagnosis not present

## 2018-06-18 DIAGNOSIS — H35421 Microcystoid degeneration of retina, right eye: Secondary | ICD-10-CM | POA: Diagnosis not present

## 2018-06-18 DIAGNOSIS — H35031 Hypertensive retinopathy, right eye: Secondary | ICD-10-CM | POA: Diagnosis not present

## 2018-06-24 ENCOUNTER — Other Ambulatory Visit: Payer: Self-pay | Admitting: Cardiovascular Disease

## 2018-07-12 ENCOUNTER — Other Ambulatory Visit: Payer: Self-pay | Admitting: Cardiovascular Disease

## 2018-07-20 DIAGNOSIS — A048 Other specified bacterial intestinal infections: Secondary | ICD-10-CM | POA: Diagnosis not present

## 2018-08-30 ENCOUNTER — Other Ambulatory Visit: Payer: Self-pay

## 2018-08-30 DIAGNOSIS — I724 Aneurysm of artery of lower extremity: Secondary | ICD-10-CM

## 2018-09-19 ENCOUNTER — Ambulatory Visit
Admission: RE | Admit: 2018-09-19 | Discharge: 2018-09-19 | Disposition: A | Discharge status: Home / Self Care | Payer: Medicare Other | Source: Ambulatory Visit | Attending: Vascular Surgery | Admitting: Vascular Surgery

## 2018-09-19 DIAGNOSIS — I724 Aneurysm of artery of lower extremity: Secondary | ICD-10-CM

## 2018-09-19 DIAGNOSIS — I728 Aneurysm of other specified arteries: Secondary | ICD-10-CM | POA: Diagnosis not present

## 2018-09-19 MED ORDER — IOPAMIDOL (ISOVUE-370) INJECTION 76%
125.0000 mL | Freq: Once | INTRAVENOUS | Status: AC | PRN
  Administered 2018-09-19: 125 mL via INTRAVENOUS

## 2018-09-25 ENCOUNTER — Encounter: Payer: Self-pay | Admitting: Vascular Surgery

## 2018-09-25 ENCOUNTER — Ambulatory Visit (INDEPENDENT_AMBULATORY_CARE_PROVIDER_SITE_OTHER): Payer: Medicare Other | Admitting: Vascular Surgery

## 2018-09-25 ENCOUNTER — Other Ambulatory Visit: Payer: Self-pay

## 2018-09-25 VITALS — BP 128/71 | HR 62 | Temp 96.7°F | Resp 18 | Ht 72.0 in | Wt 189.5 lb

## 2018-09-25 DIAGNOSIS — I724 Aneurysm of artery of lower extremity: Secondary | ICD-10-CM | POA: Diagnosis not present

## 2018-09-25 NOTE — Progress Notes (Signed)
Vascular and Vein Specialist of [facility]  Patient name: Jesse [name] MRN: [id number] DOB: Aug 07, 1927 Sex: male  REASON FOR VISIT: Follow-up diffuse aneurysmal disease  HPI: Jesse [name] is a 85 y.o. male today for follow-up.  He continues to be in good health.  He does have a inflammation of his left great toe and appears to be a ingrown toenail on the medial aspect.  This is tender to him.  He does have a normal dorsalis pedis pulse and has not had difficulty regarding peripheral artery occlusive disease.  He did have a recent CT scan for review from 09/19/2018 and I have this for review with him.  He did have an episode of severe GI hemorrhage from a gastric ulcer in October requiring 5 units of blood and is been stable since that time  Past Medical History:  Diagnosis Date  . AAA (abdominal aortic aneurysm) ([place])   . Arthritis   . Bronchitis    hx of appro 40 years ago  . Cancer ([place]) 2010   prostate ( 40 Txs. of radiation treatments)  . Coronary artery disease   . Environmental allergies   . GERD (gastroesophageal reflux disease)   . HOH (hard of hearing)    wears bilateral hearing aids  . Hyperlipidemia   . Hypertension   . Kidney stone 1968  . Myocardial infarction ([place]) 1992  . Pneumonia    hx of  . RBBB     Family History  Problem Relation Age of Onset  . Cancer Mother   . Cancer Father   . Varicose Veins Father   . Cancer Sister   . Diabetes Sister   . Heart attack Sister     SOCIAL HISTORY: Social History   Tobacco Use  . Smoking status: Former Smoker    Years: 20.00    Types: Cigarettes    Last attempt to quit: 07/25/1968    Years since quitting: 50.2  . Smokeless tobacco: Never Used  Substance Use Topics  . Alcohol use: No    Allergies  Allergen Reactions  . Lactose Intolerance (Gi) Other (See Comments)    Gas, bloating, stomach indigestion   . Adhesive [Tape] Itching and Rash    Current Outpatient  Medications  Medication Sig Dispense Refill  . acetaminophen (TYLENOL) 325 MG tablet Take 650 mg by mouth every 6 (six) hours as needed for mild pain.    Marland Kitchen Alum Hydroxide-Mag Carbonate (GAVISCON PO) Take 1 tablet by mouth daily as needed (upset stomach).     Marland Kitchen aspirin 81 MG tablet Take 81 mg by mouth daily.    . carboxymethylcellulose (REFRESH PLUS) 0.5 % SOLN Apply 1 drop to eye 3 (three) times daily as needed (dry eyes).     . carvedilol (COREG) 12.5 MG tablet TAKE 1 TABLET BY MOUTH 2 TIMES DAILY WITH A MEAL 180 tablet 3  . fenofibrate 160 MG tablet TAKE 1 TABLET BY MOUTH DAILY 90 tablet 3  . hydrochlorothiazide (HYDRODIURIL) 12.5 MG tablet Take 1 tablet (12.5 mg total) by mouth daily. 90 tablet 3  . hydrocortisone 2.5 % cream APPLY CREAM TO AFFECTED AREA TWO THREE TIMES DAILY AS NEEDED    . Lactase (DAIRY DIGESTIVE SUPPLEMENT PO) Take 1 tablet by mouth daily as needed (when eating foods containing lactose).     Marland Kitchen loratadine (CLARITIN) 10 MG tablet Take 10 mg by mouth daily.     Marland Kitchen losartan (COZAAR) 50 MG tablet TAKE 1 TABLET BY MOUTH DAILY  GENERIC EQUIVALENT FOR COZAAR 90 tablet 2  . omeprazole (PRILOSEC) 20 MG capsule Take 1 capsule (20 mg total) by mouth daily. 30 capsule 0  . pantoprazole (PROTONIX) 40 MG tablet Take 1 tablet (40 mg total) by mouth 2 (two) times daily. 60 tablet 0  . polyethylene glycol (MIRALAX / GLYCOLAX) packet Take 17 g by mouth daily. 14 each 0  . Probiotic Product (PROBIOTIC PO) Take 1 capsule by mouth daily.    . simvastatin (ZOCOR) 40 MG tablet Take 1 tablet (40 mg total) by mouth daily at 6 PM. 90 tablet 3  . Ferrous Sulfate (IRON) 325 (65 Fe) MG TABS TAKE 1 TABLET BY MOUTH ONCE DAILY FOR 90 DAYS     No current facility-administered medications for this visit.     REVIEW OF SYSTEMS:  [X]  denotes positive finding, [ ]  denotes negative finding Cardiac  Comments:  Chest pain or chest pressure:    Shortness of breath upon exertion:    Short of breath when lying  flat:    Irregular heart rhythm:        Vascular    Pain in calf, thigh, or hip brought on by ambulation:    Pain in feet at night that wakes you up from your sleep:     Blood clot in your veins:    Leg swelling:           PHYSICAL EXAM: Vitals:   09/25/18 1341  BP: 128/71  Pulse: 62  Resp: 18  Temp: (!) 96.7 F (35.9 C)  TempSrc: Oral  SpO2: 94%  Weight: 189 lb 7.8 oz (86 kg)  Height: 6' (1.829 m)    GENERAL: The patient is a well-nourished male, in no acute distress. The vital signs are documented above. CARDIOVASCULAR: Palpable radial pulses bilaterally 3+ popliteal and 2+ dorsalis pedis pulses bilaterally PULMONARY: There is good air exchange  MUSCULOSKELETAL: There are no major deformities or cyanosis. NEUROLOGIC: No focal weakness or paresthesias are detected. SKIN: There are no ulcers or rashes noted.  Inflammation of his left great toe PSYCHIATRIC: The patient has a normal affect.  DATA:  CT from 09/19/2018 was reviewed.  This shows no significant change in his extreme diffuse aneurysmal disease with internal iliac artery aneurysms and also aneurysms of his popliteal arteries.  Also has aneurysm of his coronary bypass  MEDICAL ISSUES: Stable overall.  I discussed options with [name].  As I have recommended in the past, I would recommend continued conservative management.  I feel that he would be at much higher risk for treatment of his multiple aneurysmal issues than observation alone.  I did discuss symptoms of leaking aneurysm and also of popliteal artery occlusion.  We will refer him to [facility] for evaluation of his ingrown toenail.  I will see him again in 1 year with repeat CT scan    Rosetta Posner, MD [facility] Vascular and Vein Specialists of [facility] Tel [phone] Pager [phone]

## 2018-09-28 DIAGNOSIS — Z6825 Body mass index (BMI) 25.0-25.9, adult: Secondary | ICD-10-CM | POA: Diagnosis not present

## 2018-09-28 DIAGNOSIS — L03032 Cellulitis of left toe: Secondary | ICD-10-CM | POA: Diagnosis not present

## 2018-10-03 ENCOUNTER — Encounter: Payer: Self-pay | Admitting: Podiatry

## 2018-10-03 ENCOUNTER — Ambulatory Visit: Payer: Medicare Other | Admitting: Podiatry

## 2018-10-03 ENCOUNTER — Other Ambulatory Visit: Payer: Self-pay

## 2018-10-03 VITALS — BP 110/55

## 2018-10-03 DIAGNOSIS — L600 Ingrowing nail: Secondary | ICD-10-CM | POA: Diagnosis not present

## 2018-10-03 DIAGNOSIS — L03032 Cellulitis of left toe: Secondary | ICD-10-CM | POA: Diagnosis not present

## 2018-10-04 NOTE — Progress Notes (Signed)
Subjective:   Patient ID: Jesse [name], male   DOB: 85 y.o.   MRN: [id number]   HPI Patient presents with very painful left hallux nail with slight change in the second and third and states that he is tried to trim it and its not working and there is been some redness and irritation at the end of the toe.  Is in reasonably good health and is well oriented and does not smoke and likes to be active   Review of Systems  All other systems reviewed and are negative.       Objective:  Physical Exam Vitals signs and nursing note reviewed.  Constitutional:      Appearance: He is well-developed.  Pulmonary:     Effort: Pulmonary effort is normal.  Musculoskeletal: Normal range of motion.  Skin:    General: Skin is warm.  Neurological:     Mental Status: He is alert.     Neurovascular status found to be mildly reduced with diminished pulses PT DP and sharp dull vibratory but consistent with age.  He does have reduced range of motion subtalar midtarsal joint but no crepitus of the joints and does have muscle strength that is adequate for his age.  Patient is noted to have good digital perfusion well oriented x3.  I did note there to be redness of the medial border left hallux localized in nature with pain associated with it but no proximal edema erythema drainage     Assessment:  Paronychia infection of the left hallux with incurvation of the nail bed and thickness with pain     Plan:  H&P condition reviewed and I recommended sterile sharp removal of the nail border and paronychia excision and I infiltrated 60 mg like Marcaine mixture sterile prep applied to the toe and I took the corner out reduced all proud flesh abscess tissue flush the area and applied sterile dressing.  Patient will be seen back to recheck and was advised on soaks

## 2018-10-24 ENCOUNTER — Other Ambulatory Visit: Payer: Self-pay | Admitting: *Deleted

## 2018-10-24 DIAGNOSIS — I712 Thoracic aortic aneurysm, without rupture, unspecified: Secondary | ICD-10-CM

## 2018-11-13 ENCOUNTER — Telehealth: Payer: Self-pay | Admitting: Physician Assistant

## 2018-11-13 NOTE — Telephone Encounter (Signed)
PHONE visit (landline) on 11/21/2018.      Phone Call to obtain consent   -  11/13/2018         Virtual Visit Pre-Appointment Phone Call  "(Name), I am calling you today to discuss your upcoming appointment. We are currently trying to limit exposure to the virus that causes COVID-19 by seeing patients at home rather than in the office."  1. "What is the BEST phone number to call the day of the visit?" - include this in appointment notes  2. Do you have or have access to (through a family member/friend) a smartphone with video capability that we can use for your visit?" a. If yes - list this number in appt notes as cell (if different from BEST phone #) and list the appointment type as a VIDEO visit in appointment notes b. If no - list the appointment type as a PHONE visit in appointment notes  3. Confirm consent - "In the setting of the current Covid19 crisis, you are scheduled for a (phone or video) visit with your provider on (date) at (time).  Just as we do with many in-office visits, in order for you to participate in this visit, we must obtain consent.  If you'd like, I can send this to your mychart (if signed up) or email for you to review.  Otherwise, I can obtain your verbal consent now.  All virtual visits are billed to your insurance company just like a normal visit would be.  By agreeing to a virtual visit, we'd like you to understand that the technology does not allow for your provider to perform an examination, and thus may limit your provider's ability to fully assess your condition. If your provider identifies any concerns that need to be evaluated in person, we will make arrangements to do so.  Finally, though the technology is pretty good, we cannot assure that it will always work on either your or our end, and in the setting of a video visit, we may have to convert it to a phone-only visit.  In either situation, we cannot ensure that we have a secure connection.  Are  you willing to proceed?" STAFF: Did the patient verbally acknowledge consent to telehealth visit? Document YES/NO here: YES  4. Advise patient to be prepared - "Two hours prior to your appointment, go ahead and check your blood pressure, pulse, oxygen saturation, and your weight (if you have the equipment to check those) and write them all down. When your visit starts, your provider will ask you for this information. If you have an Apple Watch or Kardia device, please plan to have heart rate information ready on the day of your appointment. Please have a pen and paper handy nearby the day of the visit as well."  5. Give patient instructions for MyChart download to smartphone OR Doximity/Doxy.me as below if video visit (depending on what platform provider is using)  6. Inform patient they will receive a phone call 15 minutes prior to their appointment time (may be from unknown caller ID) so they should be prepared to answer    TELEPHONE CALL NOTE  Jesse [name] has been deemed a candidate for a follow-up tele-health visit to limit community exposure during the Covid-19 pandemic. I spoke with the patient via phone to ensure availability of phone/video source, confirm preferred email & phone number, and discuss instructions and expectations.  I reminded Jesse [name] to be prepared with any vital sign and/or heart  rhythm information that could potentially be obtained via home monitoring, at the time of his visit. I reminded Jesse [name] to expect a phone call prior to his visit.  Thayer Headings 11/13/2018 4:21 PM   INSTRUCTIONS FOR DOWNLOADING THE MYCHART APP TO SMARTPHONE  - The patient must first make sure to have activated MyChart and know their login information - If Apple, go to [employer] and type in MyChart in the search bar and download the app. If Android, ask patient to go to [employer] and type in [place] in the search bar and download the app. The app is free but as with any other  app downloads, their phone may require them to verify saved payment information or Apple/Android password.  - The patient will need to then log into the app with their MyChart username and password, and select [facility] as their healthcare provider to link the account. When it is time for your visit, go to the MyChart app, find appointments, and click Begin Video Visit. Be sure to Select Allow for your device to access the Microphone and Camera for your visit. You will then be connected, and your provider will be with you shortly.  **If they have any issues connecting, or need assistance please contact MyChart service desk [phone])**  **If using a computer, in order to ensure the best quality for their visit they will need to use either of the following Internet Browsers: [employer], or Google Chrome**  IF USING DOXIMITY or DOXY.ME - The patient will receive a link just prior to their visit by text.     FULL LENGTH CONSENT FOR TELE-HEALTH VISIT   I hereby voluntarily request, consent and authorize [facility] and its employed or contracted physicians, physician assistants, nurse practitioners or other licensed health care professionals (the Practitioner), to provide me with telemedicine health care services (the Services") as deemed necessary by the treating Practitioner. I acknowledge and consent to receive the Services by the Practitioner via telemedicine. I understand that the telemedicine visit will involve communicating with the Practitioner through live audiovisual communication technology and the disclosure of certain medical information by electronic transmission. I acknowledge that I have been given the opportunity to request an in-person assessment or other available alternative prior to the telemedicine visit and am voluntarily participating in the telemedicine visit.  I understand that I have the right to withhold or withdraw my consent to the use of  telemedicine in the course of my care at any time, without affecting my right to future care or treatment, and that the Practitioner or I may terminate the telemedicine visit at any time. I understand that I have the right to inspect all information obtained and/or recorded in the course of the telemedicine visit and may receive copies of available information for a reasonable fee.  I understand that some of the potential risks of receiving the Services via telemedicine include:   Delay or interruption in medical evaluation due to technological equipment failure or disruption;  Information transmitted may not be sufficient (e.g. poor resolution of images) to allow for appropriate medical decision making by the Practitioner; and/or   In rare instances, security protocols could fail, causing a breach of personal health information.  Furthermore, I acknowledge that it is my responsibility to provide information about my medical history, conditions and care that is complete and accurate to the best of my ability. I acknowledge that Practitioner's advice, recommendations, and/or decision may be based on factors  not within their control, such as incomplete or inaccurate data provided by me or distortions of diagnostic images or specimens that may result from electronic transmissions. I understand that the practice of medicine is not an exact science and that Practitioner makes no warranties or guarantees regarding treatment outcomes. I acknowledge that I will receive a copy of this consent concurrently upon execution via email to the email address I last provided but may also request a printed copy by calling the office of [facility].    I understand that my insurance will be billed for this visit.   I have read or had this consent read to me.  I understand the contents of this consent, which adequately explains the benefits and risks of the Services being provided via telemedicine.   I have been  provided ample opportunity to ask questions regarding this consent and the Services and have had my questions answered to my satisfaction.  I give my informed consent for the services to be provided through the use of telemedicine in my medical care  By participating in this telemedicine visit I agree to the above.

## 2018-11-20 ENCOUNTER — Telehealth: Payer: Medicare Other | Admitting: Physician Assistant

## 2018-11-20 DIAGNOSIS — I2510 Atherosclerotic heart disease of native coronary artery without angina pectoris: Secondary | ICD-10-CM | POA: Insufficient documentation

## 2018-11-20 DIAGNOSIS — I712 Thoracic aortic aneurysm, without rupture, unspecified: Secondary | ICD-10-CM | POA: Insufficient documentation

## 2018-11-20 DIAGNOSIS — I5022 Chronic systolic (congestive) heart failure: Secondary | ICD-10-CM | POA: Insufficient documentation

## 2018-11-20 NOTE — Progress Notes (Signed)
Virtual Visit via Telephone Note   This visit type was conducted due to national recommendations for restrictions regarding the COVID-19 Pandemic (e.g. social distancing) in an effort to limit this patient's exposure and mitigate transmission in our community.  Due to his co-morbid illnesses, this patient is at least at moderate risk for complications without adequate follow up.  This format is felt to be most appropriate for this patient at this time.  The patient did not have access to video technology/had technical difficulties with video requiring transitioning to audio format only (telephone).  All issues noted in this document were discussed and addressed.  No physical exam could be performed with this format.  Please refer to the patient's chart for his  consent to telehealth for [facility].   Evaluation Performed:  Follow-up visit  Date:  11/21/2018   ID:  Jesse [name], DOB October 26, 1927, MRN [id number]  Patient Location: Home Provider Location: Home  PCP:  [name], MD  Cardiologist:  [name], MD   Electrophysiologist:  None  VVS: Dr. Donnetta Hutching TCTS:  Dr. Servando Snare  Chief Complaint:  FU on CAD  History of Present Illness:    Jesse [name] is a 85 y.o. male with coronary artery disease s/p CABG in 5009, systolic HF, diffuse aneurysmal disease (followed by Dr. Donnetta Hutching and Dr. Servando Snare - thoracic aortic aneurysm, iliac aneurysms, popliteal artery aneurysms), prior upper GI bleed secondary to duodenal ulcer requiring transfusions with PRBCs, left leg venous insufficiency (vein harvesting site).  He was last seen by Dr. Burt Knack in 04/2018.    Today, he notes he is doing well.  He denies chest pain, shortness of breath, syncope, orthopnea, PND or significant pedal edema.   The patient does not have symptoms concerning for COVID-19 infection (fever, chills, cough, or new shortness of breath).    Past Medical History:  Diagnosis Date  . AAA (abdominal aortic aneurysm) ([place])   .  Arthritis   . Bronchitis    hx of appro 40 years ago  . Cancer ([place]) 2010   prostate ( 40 Txs. of radiation treatments)  . Coronary artery disease   . Environmental allergies   . GERD (gastroesophageal reflux disease)   . HOH (hard of hearing)    wears bilateral hearing aids  . Hyperlipidemia   . Hypertension   . Kidney stone 1968  . Myocardial infarction ([place]) 1992  . Pneumonia    hx of  . RBBB    Past Surgical History:  Procedure Laterality Date  . ABDOMINAL AORTIC ANEURYSM REPAIR  08-13-1993   Dr Cameron Sprang  . CARDIAC CATHETERIZATION    . COLONOSCOPY W/ POLYPECTOMY    . CORONARY ARTERY BYPASS GRAFT  03-01-1991   Dr. Servando Snare  . ESOPHAGOGASTRODUODENOSCOPY (EGD) WITH PROPOFOL N/A 05/04/2018   Procedure: ESOPHAGOGASTRODUODENOSCOPY (EGD) WITH PROPOFOL;  Surgeon: [name], MD;  Location: [facility];  Service: Gastroenterology;  Laterality: N/A;  . EYE SURGERY     Detached retina (Left eye); bilateral cataract removal  . FALSE ANEURYSM REPAIR  05/07/2012   Procedure: REPAIR FALSE ANEURYSM;  Surgeon: Rosetta Posner, MD;  Location: [facility] OR;  Service: Vascular;  Laterality: Left;  Repair of Left Femoral Artery Aneurysm  . HERNIA REPAIR    . HOT HEMOSTASIS N/A 05/04/2018   Procedure: HOT HEMOSTASIS (ARGON PLASMA COAGULATION/BICAP);  Surgeon: [name], MD;  Location: [facility];  Service: Gastroenterology;  Laterality: N/A;  . ORIF PATELLA Right 06/09/2015   Procedure: OPEN REDUCTION INTERNAL (ORIF) FIXATION  PATELLA;  Surgeon: Marybelle Killings, MD;  Location: [facility];  Service: Orthopedics;  Laterality: Right;  . repair of bowel obstruction  1999   Dr. Dalbert Batman  . repair of ventral hernia  04-20-1994   P. Cameron Sprang MD  . resection femoral artery  03/19/2012   Dr. Curt Jews  . SUBMUCOSAL INJECTION  05/04/2018   Procedure: SUBMUCOSAL INJECTION;  Surgeon: [name], MD;  Location: [facility];  Service: Gastroenterology;;     Current Meds  Medication Sig  .  acetaminophen (TYLENOL) 325 MG tablet Take 650 mg by mouth every 6 (six) hours as needed for mild pain.  Marland Kitchen Alum Hydroxide-Mag Carbonate (GAVISCON PO) Take 1 tablet by mouth daily as needed (upset stomach).   Marland Kitchen aspirin 81 MG tablet Take 81 mg by mouth daily.  . carboxymethylcellulose (REFRESH PLUS) 0.5 % SOLN Apply 1 drop to eye 3 (three) times daily as needed (dry eyes).   . carvedilol (COREG) 12.5 MG tablet TAKE 1 TABLET BY MOUTH 2 TIMES DAILY WITH A MEAL  . fenofibrate 160 MG tablet TAKE 1 TABLET BY MOUTH DAILY  . hydrochlorothiazide (HYDRODIURIL) 12.5 MG tablet Take 1 tablet (12.5 mg total) by mouth daily.  . hydrocortisone 2.5 % cream Apply 1 application topically as needed (for dry ears).   . Lactase (DAIRY DIGESTIVE SUPPLEMENT PO) Take 1 tablet by mouth daily as needed (when eating foods containing lactose).   Marland Kitchen loratadine (CLARITIN) 10 MG tablet Take 10 mg by mouth daily.   Marland Kitchen losartan (COZAAR) 50 MG tablet TAKE 1 TABLET BY MOUTH DAILY GENERIC EQUIVALENT FOR COZAAR  . pantoprazole (PROTONIX) 40 MG tablet Take 1 tablet (40 mg total) by mouth 2 (two) times daily.  . Probiotic Product (PROBIOTIC PO) Take 1 capsule by mouth daily.  . simvastatin (ZOCOR) 40 MG tablet Take 1 tablet (40 mg total) by mouth daily at 6 PM.     Allergies:   Adhesive [tape] and Lactose intolerance (gi)   Social History   Tobacco Use  . Smoking status: Former Smoker    Years: 20.00    Types: Cigarettes    Last attempt to quit: 07/25/1968    Years since quitting: 50.3  . Smokeless tobacco: Never Used  Substance Use Topics  . Alcohol use: No  . Drug use: No     Family Hx: The patient's family history includes Cancer in his father, mother, and sister; Diabetes in his sister; Heart attack in his sister; Varicose Veins in his father.  ROS:   Please see the history of present illness.    All other systems reviewed and are negative.   Prior CV studies:   The following studies were reviewed today:  Echo  05/25/18 EF 40-45, no RWMA, Gr 1 DD, MAC, mod LAE  Echo 01/29/16 Inf/lat/sept HK, mild focal basal septal hypertrophy, EF 40-45, Gr 1 DD, mod LAE  Myoview 05/08/13 Overall Impression:  Low risk stress nuclear study. There is a primarily fixed basal to mid inferior and inferolateral perfusion defect, medium-sized and moderate in intensity.  Inferolateral hypokinesis.  LV Ejection Fraction: 48%.  LV Wall Motion:  Inferolateral hypokinesis.  Myoview 05/25/11 EF 50;  Low risk stress nuclear study. Mildly decreased uptake in inferior and infero-lateral walls consistent with small previous infarct. Trivial peri-infarct ischemia.   Labs/Other Tests and Data Reviewed:    EKG:  No ECG reviewed.  Recent Labs: 05/05/2018: ALT 11; BUN 49; Creatinine, Ser 1.00; Platelets 136; Potassium 3.7; Sodium 145 05/07/2018: Hemoglobin 8.7  Recent Lipid Panel Lab Results  Component Value Date/Time   CHOL 106 03/02/2017 07:53 AM   TRIG 57 03/02/2017 07:53 AM   TRIG 84 05/05/2006 08:39 AM   HDL 32 (L) 03/02/2017 07:53 AM   CHOLHDL 3.3 03/02/2017 07:53 AM   CHOLHDL 4 05/20/2014 08:33 AM   LDLCALC 63 03/02/2017 07:53 AM   From [employer]    Wt Readings from Last 3 Encounters:  11/21/18 180 lb (81.6 kg)  09/25/18 189 lb 7.8 oz (86 kg)  05/21/18 185 lb 12.8 oz (84.3 kg)     Objective:    Vital Signs:  BP 110/62   Pulse 64   Ht 6' (1.829 m)   Wt 180 lb (81.6 kg)   BMI 24.41 kg/m    VITAL SIGNS:  reviewed GEN:  no acute distress RESPIRATORY:  No labored breathing during conversation. NEURO:  Alert and oriented. PSYCH:  He seems to be in good spirits.  ASSESSMENT & PLAN:    Coronary artery disease involving native coronary artery of native heart without angina pectoris Hx of CABG in 1992. He is doing well without angina.  Continue ASA,, beta-blocker, statin.    Chronic systolic heart failure (HCC) EF 40-45.  NYHA 2.  Volume seems stable.  Continue beta-blocker, angiotensin receptor blocker.   Essential hypertension The patient's blood pressure is controlled on his current regimen.  Continue current therapy.    Mixed hyperlipidemia Continue statin Rx.  Arrange follow up fasting Lipids at next in person office visit.   Thoracic aortic aneurysm without rupture ([place]) Continue follow up with Dr. Servando Snare.   COVID-19 Education: The signs and symptoms of COVID-19 were discussed with the patient and how to seek care for testing (follow up with PCP or arrange E-visit).  The importance of social distancing was discussed today.  Time:   Today, I have spent 9 minutes with the patient with telehealth technology discussing the above problems.     Medication Adjustments/Labs and Tests Ordered: Current medicines are reviewed at length with the patient today.  Concerns regarding medicines are outlined above.   Tests Ordered: No orders of the defined types were placed in this encounter.   Medication Changes: No orders of the defined types were placed in this encounter.   Disposition:  Follow up in 6 month(s)  Signed, Richardson Dopp, PA-C  11/21/2018 9:59 AM    [place]

## 2018-11-21 ENCOUNTER — Encounter: Payer: Self-pay | Admitting: Physician Assistant

## 2018-11-21 ENCOUNTER — Telehealth (INDEPENDENT_AMBULATORY_CARE_PROVIDER_SITE_OTHER): Payer: Medicare Other | Admitting: Physician Assistant

## 2018-11-21 ENCOUNTER — Other Ambulatory Visit: Payer: Self-pay

## 2018-11-21 VITALS — BP 110/62 | HR 64 | Ht 72.0 in | Wt 180.0 lb

## 2018-11-21 DIAGNOSIS — I2510 Atherosclerotic heart disease of native coronary artery without angina pectoris: Secondary | ICD-10-CM | POA: Diagnosis not present

## 2018-11-21 DIAGNOSIS — I10 Essential (primary) hypertension: Secondary | ICD-10-CM

## 2018-11-21 DIAGNOSIS — I712 Thoracic aortic aneurysm, without rupture, unspecified: Secondary | ICD-10-CM

## 2018-11-21 DIAGNOSIS — E782 Mixed hyperlipidemia: Secondary | ICD-10-CM

## 2018-11-21 DIAGNOSIS — Z7189 Other specified counseling: Secondary | ICD-10-CM

## 2018-11-21 DIAGNOSIS — I5022 Chronic systolic (congestive) heart failure: Secondary | ICD-10-CM

## 2018-11-21 NOTE — Patient Instructions (Signed)
Medication Instructions:  No changes  If you need a refill on your cardiac medications before your next appointment, please call your pharmacy.   Lab work: None   If you have labs (blood work) drawn today and your tests are completely normal, you will receive your results only by: . MyChart Message (if you have MyChart) OR . A paper copy in the mail If you have any lab test that is abnormal or we need to change your treatment, we will call you to review the results.  Testing/Procedures: None   Follow-Up: At [facility], you and your health needs are our priority.  As part of our continuing mission to provide you with exceptional heart care, we have created designated Provider Care Teams.  These Care Teams include your primary Cardiologist (physician) and Advanced Practice Providers (APPs -  Physician Assistants and Nurse Practitioners) who all work together to provide you with the care you need, when you need it. You will need a follow up appointment in:  6 months.  Please call our office 2 months in advance to schedule this appointment.  You may see Michael Cooper, MD or Bailei Buist, PA-C  Any Other Special Instructions Will Be Listed Below (If Applicable).     

## 2018-11-29 DIAGNOSIS — M545 Low back pain: Secondary | ICD-10-CM | POA: Diagnosis not present

## 2018-11-29 DIAGNOSIS — R531 Weakness: Secondary | ICD-10-CM | POA: Diagnosis not present

## 2018-11-29 DIAGNOSIS — Z862 Personal history of diseases of the blood and blood-forming organs and certain disorders involving the immune mechanism: Secondary | ICD-10-CM | POA: Diagnosis not present

## 2019-01-09 ENCOUNTER — Other Ambulatory Visit: Payer: Self-pay

## 2019-01-10 ENCOUNTER — Ambulatory Visit: Payer: Medicare Other | Admitting: Cardiothoracic Surgery

## 2019-01-10 ENCOUNTER — Ambulatory Visit
Admission: RE | Admit: 2019-01-10 | Discharge: 2019-01-10 | Disposition: A | Discharge status: Home / Self Care | Payer: Medicare Other | Source: Ambulatory Visit | Attending: Cardiothoracic Surgery | Admitting: Cardiothoracic Surgery

## 2019-01-10 ENCOUNTER — Encounter: Payer: Self-pay | Admitting: Cardiothoracic Surgery

## 2019-01-10 VITALS — BP 108/69 | HR 69 | Temp 97.7°F | Resp 16 | Ht 72.0 in | Wt 178.0 lb

## 2019-01-10 DIAGNOSIS — I712 Thoracic aortic aneurysm, without rupture, unspecified: Secondary | ICD-10-CM

## 2019-01-10 MED ORDER — IOPAMIDOL (ISOVUE-370) INJECTION 76%
75.0000 mL | Freq: Once | INTRAVENOUS | Status: AC | PRN
  Administered 2019-01-10: 75 mL via INTRAVENOUS

## 2019-01-10 NOTE — Progress Notes (Signed)
[address]       [place] [address]             [phone]                    Jesse [name] [place] Medical Record #[id number] Date of Birth: 12/06/1927  Referring: Early, [name], MD Primary Care: [name], MD Primary cardiology: [name], MD  Chief Complaint:    Chief Complaint  Patient presents with  . Thoracic Aortic Aneurysm    1 year f/u with CTA Chest    History of Present Illness:     Jesse [name] 85 y.o. male is seen in the office for follow-up of of  thoracic aneurysm. He has been followed by Dr Early  for abdominal aneurysm disease known since 1992 when he had CABG by me.   He had open abdominal aneurysm repair and bilateral femoral  aneurysm repair  In 1995       In the fall 2019 the patient developed a 5 unit GI bleed, transiently he was off aspirin but currently on 81 mg a day Since last seen the patient has no new complaints.  He returns today with a follow-up CTA of the chest.   Current Activity/ Functional Status:  Patient is independent with mobility/ambulation, transfers, ADL's, IADL's.   Zubrod Score: At the time of surgery this patient's most appropriate activity status/level should be described as: []     0    Normal activity, no symptoms [x]     1    Restricted in physical strenuous activity but ambulatory, able to do out light work []     2    Ambulatory and capable of self care, unable to do work activities, up and about               >50 % of waking hours                              []     3    Only limited self care, in bed greater than 50% of waking hours []     4    Completely disabled, no self care, confined to bed or chair []     5    Moribund   Past Medical History:  Diagnosis Date  . AAA (abdominal aortic aneurysm) ([place])   . Arthritis   . Bronchitis    hx of appro 40 years ago  . Cancer ([place]) 2010   prostate ( 40 Txs. of radiation treatments)  . Coronary artery disease   . Environmental allergies   .  GERD (gastroesophageal reflux disease)   . HOH (hard of hearing)    wears bilateral hearing aids  . Hyperlipidemia   . Hypertension   . Kidney stone 1968  . Myocardial infarction ([place]) 1992  . Pneumonia    hx of  . RBBB     Past Surgical History:  Procedure Laterality Date  . ABDOMINAL AORTIC ANEURYSM REPAIR  08-13-1993   Dr Cameron Sprang  . CARDIAC CATHETERIZATION    . COLONOSCOPY W/ POLYPECTOMY    . CORONARY ARTERY BYPASS GRAFT  03-01-1991   Dr. Servando Snare  . ESOPHAGOGASTRODUODENOSCOPY (EGD) WITH PROPOFOL N/A 05/04/2018   Procedure: ESOPHAGOGASTRODUODENOSCOPY (EGD) WITH PROPOFOL;  Surgeon: [name], MD;  Location: [facility];  Service: Gastroenterology;  Laterality: N/A;  . EYE SURGERY     Detached  retina (Left eye); bilateral cataract removal  . FALSE ANEURYSM REPAIR  05/07/2012   Procedure: REPAIR FALSE ANEURYSM;  Surgeon: Rosetta Posner, MD;  Location: [facility];  Service: Vascular;  Laterality: Left;  Repair of Left Femoral Artery Aneurysm  . HERNIA REPAIR    . HOT HEMOSTASIS N/A 05/04/2018   Procedure: HOT HEMOSTASIS (ARGON PLASMA COAGULATION/BICAP);  Surgeon: [name], MD;  Location: [facility];  Service: Gastroenterology;  Laterality: N/A;  . ORIF PATELLA Right 06/09/2015   Procedure: OPEN REDUCTION INTERNAL (ORIF) FIXATION PATELLA;  Surgeon: Marybelle Killings, MD;  Location: [facility];  Service: Orthopedics;  Laterality: Right;  . repair of bowel obstruction  1999   Dr. Dalbert Batman  . repair of ventral hernia  04-20-1994   P. Cameron Sprang MD  . resection femoral artery  03/19/2012   Dr. Curt Jews  . SUBMUCOSAL INJECTION  05/04/2018   Procedure: SUBMUCOSAL INJECTION;  Surgeon: [name], MD;  Location: [facility];  Service: Gastroenterology;;    Family History  Problem Relation Age of Onset  . Cancer Mother   . Cancer Father   . Varicose Veins Father   . Cancer Sister   . Diabetes Sister   . Heart attack Sister     Social History   Socioeconomic History  .  Marital status: Widowed    Spouse name: Not on file  . Number of children: Not on file  . Years of education: Not on file  . Highest education level: Not on file  Occupational History  . Not on file  Social Needs  . Financial resource strain: Not on file  . Food insecurity    Worry: Not on file    Inability: Not on file  . Transportation needs    Medical: Not on file    Non-medical: Not on file  Tobacco Use  . Smoking status: Former Smoker    Years: 20.00    Types: Cigarettes    Quit date: 07/25/1968    Years since quitting: 50.4  . Smokeless tobacco: Never Used  Substance and Sexual Activity  . Alcohol use: No  . Drug use: No  . Sexual activity: Not on file  Lifestyle  . Physical activity    Days per week: Not on file    Minutes per session: Not on file  . Stress: Not on file  Relationships  . Social Herbalist on phone: Not on file    Gets together: Not on file    Attends religious service: Not on file    Active member of club or organization: Not on file    Attends meetings of clubs or organizations: Not on file    Relationship status: Not on file  . Intimate partner violence    Fear of current or ex partner: Not on file    Emotionally abused: Not on file    Physically abused: Not on file    Forced sexual activity: Not on file  Other Topics Concern  . Not on file  Social History Narrative  . Not on file    Social History   Tobacco Use  Smoking Status Former Smoker  . Years: 20.00  . Types: Cigarettes  . Quit date: 07/25/1968  . Years since quitting: 50.4  Smokeless Tobacco Never Used    Social History   Substance and Sexual Activity  Alcohol Use No     Allergies  Allergen Reactions  . Adhesive [Tape] Itching and Rash  . Lactose Intolerance (  Gi) Other (See Comments)    Gas, bloating, stomach indigestion     Current Outpatient Medications  Medication Sig Dispense Refill  . acetaminophen (TYLENOL) 325 MG tablet Take 650 mg by mouth  every 6 (six) hours as needed for mild pain.    Marland Kitchen Alum Hydroxide-Mag Carbonate (GAVISCON PO) Take 1 tablet by mouth daily as needed (upset stomach).     Marland Kitchen aspirin 81 MG tablet Take 81 mg by mouth daily.    . carboxymethylcellulose (REFRESH PLUS) 0.5 % SOLN Apply 1 drop to eye 3 (three) times daily as needed (dry eyes).     . carvedilol (COREG) 12.5 MG tablet TAKE 1 TABLET BY MOUTH 2 TIMES DAILY WITH A MEAL 180 tablet 3  . fenofibrate 160 MG tablet TAKE 1 TABLET BY MOUTH DAILY 90 tablet 3  . hydrochlorothiazide (HYDRODIURIL) 12.5 MG tablet Take 1 tablet (12.5 mg total) by mouth daily. 90 tablet 3  . hydrocortisone 2.5 % cream Apply 1 application topically as needed (for dry ears).     . Lactase (DAIRY DIGESTIVE SUPPLEMENT PO) Take 1 tablet by mouth daily as needed (when eating foods containing lactose).     Marland Kitchen loratadine (CLARITIN) 10 MG tablet Take 10 mg by mouth daily.     Marland Kitchen losartan (COZAAR) 50 MG tablet TAKE 1 TABLET BY MOUTH DAILY GENERIC EQUIVALENT FOR COZAAR 90 tablet 2  . pantoprazole (PROTONIX) 40 MG tablet Take 1 tablet (40 mg total) by mouth 2 (two) times daily. 60 tablet 0  . Probiotic Product (PROBIOTIC PO) Take 1 capsule by mouth daily.    . simvastatin (ZOCOR) 40 MG tablet Take 1 tablet (40 mg total) by mouth daily at 6 PM. 90 tablet 3   No current facility-administered medications for this visit.       Review of Systems:     Cardiac Review of Systems: Y or N  Chest Pain [ N ]  Resting SOB [  N] Exertional SOB  [  N]  Orthopnea [ N ]   Pedal Edema Aqua.Slicker   ]    Palpitations Aqua.Slicker  ] Syncope  Aqua.Slicker  ]   Presyncope [  N ]  General Review of Systems: [Y] = yes [  ]=no Constitional: recent weight change [ n ];  Wt loss over the last 3 months [   ] anorexia [  ]; fatigue [  ]; nausea [  ]; night sweats [  ]; fever [  ]; or chills [  ];          Dental: poor dentition[  ]; Last Dentist visit:   Eye : blurred vision [  ]; diplopia [   ]; vision changes [  ];  Amaurosis fugax[  ]; Resp: cough  [ ] ;  wheezing[  ];  hemoptysis[  ]; shortness of breath[  ]; paroxysmal nocturnal dyspnea[] ; dyspnea on exertion[  ]; or orthopnea[ n ];  GI:  gallstones[  ], vomiting[  ];  dysphagia[  ]; melena[  ];  hematochezia [  ]; heartburn[  ];   Hx of  Colonoscopy[  ]; GU: kidney stones [  ]; hematuria[  ];   dysuria [  ];  nocturia[  ];  history of     obstruction [  ]; urinary frequency [  ]             Skin: rash, swelling[  ];, hair loss[  ];  peripheral edema[  ];  or itching[  ]; Musculosketetal:  myalgias[  ];  joint swelling[  ];  joint erythema[  ];  joint pain[  ];  back pain[  ];  Heme/Lymph: bruising[  ];  bleeding[  ];  anemia[  ];  Neuro: TIA[  ];  headaches[  ];  stroke[  ];  vertigo[  ];  seizures[  n];   paresthesias[  ];  difficulty walking[ n ];  Psych:depression[  ]; anxiety[  ];  Endocrine: diabetes[  ];  thyroid dysfunction[  ];  Immunizations: Flu up to date Blue.Reese ]; Pneumococcal up to date [  ];  Other:  Physical Exam: Ht 6' (1.829 m)   BMI 24.41 kg/m   PHYSICAL EXAMINATION: General appearance: alert and appears stated age Head: Normocephalic, without obvious abnormality, atraumatic Neck: no adenopathy, no carotid bruit, no JVD, supple, symmetrical, trachea midline and thyroid not enlarged, symmetric, no tenderness/mass/nodules Lymph nodes: Cervical, supraclavicular, and axillary nodes normal. Resp: clear to auscultation bilaterally Cardio: regular rate and rhythm, S1, S2 normal, no murmur, click, rub or gallop GI: soft, non-tender; bowel sounds normal; no masses,  no organomegaly and Significant abdominal incisional hernia from previous aneurysm repair Extremities: extremities normal, atraumatic, no cyanosis or edema and Homans sign is negative, no sign of DVT Neurologic: Grossly normal Bilateral groin incisions present well-healed Bilateral popliteal artery pulses easily felt DP PT pulses present bilaterally  Diagnostic Studies & Laboratory data:     Recent Radiology  Findings:  Ct Angio Chest Aorta W &/or Wo Contrast  Result Date: 01/10/2019 CLINICAL DATA:  Thoracic aortic aneurysm follow-up. EXAM: CT ANGIOGRAPHY CHEST WITH CONTRAST TECHNIQUE: Multidetector CT imaging of the chest was performed using the standard protocol during bolus administration of intravenous contrast. Multiplanar CT image reconstructions and MIPs were obtained to evaluate the vascular anatomy. CONTRAST:  27mL ISOVUE-370 IOPAMIDOL (ISOVUE-370) INJECTION 76% COMPARISON:  CTA chest dated Nov 30, 2017. FINDINGS: Cardiovascular: Unchanged 4.1 cm ascending thoracic aortic aneurysm. Unchanged 4.5 cm proximal descending thoracic aortic aneurysm. No dissection. Coronary, aortic arch, and branch vessel atherosclerotic vascular disease. Normal heart size. No pericardial effusion. Prior CABG. Patent great vessels. Mediastinum/Nodes: No enlarged mediastinal, hilar, or axillary lymph nodes. Thyroid gland, trachea, and esophagus demonstrate no significant findings. Lungs/Pleura: No focal consolidation, pleural effusion, or pneumothorax. No suspicious pulmonary nodule. Unchanged scarring at the peripheral left lung base. Unchanged chronic reticulonodular opacities in the posterior right lower lobe. Upper Abdomen: No acute abnormality.  Unchanged cholelithiasis. Musculoskeletal: No chest wall abnormality. No acute or significant osseous findings. Review of the MIP images confirms the above findings. IMPRESSION: 1. Stable 4.1 cm ascending thoracic aortic and 4.5 cm proximal descending thoracic aortic aneurysms. Recommend annual imaging followup by CTA or MRA. This recommendation follows 2010 ACCF/AHA/AATS/ACR/ASA/SCA/SCAI/SIR/STS/SVM Guidelines for the Diagnosis and Management of Patients with Thoracic Aortic Disease. Circulation. 2010; 121: D326-Z124. Aortic aneurysm NOS (ICD10-I71.9) 2.  Aortic atherosclerosis (ICD10-I70.0). Electronically Signed   By: Titus Dubin M.D.   On: 01/10/2019 14:43   Not mentioned in the  report is evidence of dilatation of the vein graft to the right coronary artery appears stable from previous scans, the right vein graft appears to be patent  I have independently reviewed the above radiology studies  and reviewed the findings with the patient.   Ct Angio Chest Aorta W/cm &/or Wo/cm  Result Date: 10/27/2016 CLINICAL DATA:  Thoracic aortic aneurysm without rupture. EXAM: CT ANGIOGRAPHY CHEST WITH CONTRAST TECHNIQUE: Multidetector CT imaging of the chest was performed using the standard protocol during bolus administration of intravenous contrast.  Multiplanar CT image reconstructions and MIPs were obtained to evaluate the vascular anatomy. CONTRAST:  75 mL of Isovue 370 intravenously. COMPARISON:  CT scan of May 18, 2016. FINDINGS: Cardiovascular: Stable 4.2 cm ascending thoracic aortic aneurysm is noted. Stable 4.5 cm proximal descending thoracic aortic aneurysm is noted. Aortic root measures 3.9 cm. Atherosclerosis of thoracic aorta is noted. No dissection is noted. Great vessels are widely patent. Status post coronary artery bypass graft. 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 Mediastinum/Nodes: No enlarged mediastinal, hilar, or axillary lymph nodes. Thyroid gland, trachea, and esophagus demonstrate no significant findings. Lungs/Pleura: Lungs are clear. No pleural effusion or pneumothorax. Upper Abdomen: Cholelithiasis is noted. No other significant abnormality is noted in the visualized portion of upper abdomen. Musculoskeletal: No chest wall abnormality. No acute or significant osseous findings. Review of the MIP images confirms the above findings. IMPRESSION: Stable 4.2 cm ascending thoracic aortic aneurysm. Stable 4.5 cm proximal descending thoracic aortic aneurysm. Recommend annual imaging followup by CTA or MRA. This recommendation follows 2010 ACCF/AHA/AATS/ACR/ASA/SCA/SCAI/SIR/STS/SVM Guidelines for the Diagnosis and Management of Patients with Thoracic  Aortic Disease. Circulation. 2010; 121: A250-N397. Lithiasis. Electronically Signed   By: Marijo Conception, M.D.   On: 10/27/2016 13:52   Ct Angio Chest Pe W/cm &/or Wo Cm  01/01/2016  CLINICAL DATA:  85 year old presenting with intermittent shortness of breath for the past several days which acutely worsened this morning. Intermittent cough. Elevated D-dimer. Prior CABG in 1992 and prior abdominal aortic aneurysm repair in 1995. EXAM: CT ANGIOGRAPHY CHEST WITH CONTRAST TECHNIQUE: Multidetector CT imaging of the chest was performed using the standard protocol during bolus administration of intravenous contrast. Multiplanar CT image reconstructions and MIPs were obtained to evaluate the vascular anatomy. CONTRAST:  100 ml Isovue 370 IV. COMPARISON:  No prior CT.  Multiple prior chest x-rays. FINDINGS: Technical quality:  Very good. Pulmonary embolism:  Absent. Cardiovascular: Prior sternotomy for CABG. Heart moderately to markedly enlarged. Mild left ventricular enlargement and possible mild left ventricular hypertrophy. Right ventricular enlargement and right ventricular hypertrophy. No pericardial effusion. Moderate atherosclerosis involving the thoracic and upper abdominal aorta with evidence of the descending thoracic aortic aneurysm with maximum diameter approximating 4.5 cm. Mediastinum/Lymph Nodes: No pathologically enlarged mediastinal, hilar or axillary lymph nodes. No mediastinal masses. Normal-appearing esophagus. Visualized thyroid gland unremarkable. Lungs/Pleura: Streaky and patchy opacities in the left lower lobe. Lungs otherwise clear. No evidence of interstitial pulmonary edema. No pulmonary parenchymal nodules or masses. No pleural effusions. Central airways patent with moderate to marked bronchial wall thickening. Upper abdomen: Numerous calcified gallstones without evidence of acute cholecystitis. No acute abnormalities. Musculoskeletal: Severe osseous demineralization. Exaggeration of the usual  kyphosis. Mild degenerative disc disease and spondylosis involving the mid and lower thoracic spine. Review of the MIP images confirms the above findings. IMPRESSION: 1. No evidence of pulmonary embolism. 2. Moderate to marked cardiomegaly with biventricular enlarged. 3. Approximate 4.5 cm descending thoracic aortic aneurysm. Recommend annual imaging followup by CTA or MRA. This recommendation follows 2010 ACCF/AHA/AATS/ACR/ASA/SCA/SCAI/SIR/STS/SVM Guidelines for the Diagnosis and Management of Patients with Thoracic Aortic Disease. Circulation. 2010; 121: Q734-L937. 4. Left lower lobe atelectasis and/or bronchopneumonia. Moderate to severe changes of bronchitis and/or asthma. 5. Cholelithiasis without evidence of acute cholecystitis. Electronically Signed   By: Evangeline Dakin M.D.   On: 01/01/2016 15:10   Dg Abd 2 Views  01/01/2016  CLINICAL DATA:  Abdominal pain for 1 week.  Nausea. EXAM: ABDOMEN - 2 VIEW COMPARISON:  None. FINDINGS: Atelectasis seen  in the left lung base. Lung bases otherwise normal. No free air, portal venous gas, or pneumatosis. Surgical clips are seen within the mid central abdomen. Surgical clips are seen in the pelvis is well. There is no evidence of bowel obstruction on this study. Both kidneys are largely obscured by bowel contents but no renal stones are identified. IMPRESSION: No acute abnormalities. Electronically Signed   By: Dorise Bullion III M.D   On: 01/01/2016 12:57     I have independently reviewed the above radiologic studies.  Recent Lab Findings: Lab Results  Component Value Date   WBC 7.5 05/05/2018   HGB 8.7 (L) 05/07/2018   HCT 26.8 (L) 05/07/2018   PLT 136 (L) 05/05/2018   GLUCOSE 99 05/05/2018   CHOL 106 03/02/2017   TRIG 57 03/02/2017   HDL 32 (L) 03/02/2017   LDLCALC 63 03/02/2017   ALT 11 05/05/2018   AST 18 05/05/2018   NA 145 05/05/2018   K 3.7 05/05/2018   CL 115 (H) 05/05/2018   CREATININE 1.00 05/05/2018   BUN 49 (H) 05/05/2018   CO2  24 05/05/2018   INR 1.23 06/09/2015      Assessment / Plan:  #1 stable 4.2 cm ascending thoracic aneurysm #2 stable 4.5 cm proximal descending thoracic aneurysm #3 history of coronary artery disease currently the patient has no symptoms of angina status post coronary artery bypass grafting 28  years ago- #4 history of hypertension  #5 history of hyperlipidemia currently on simvastatin  Patient also has 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  I reviewed with patient the CT scan of the chest done today and do not recommend any surgical intervention for his aneurysmal disease based on its current size.  He was cautioned about strenuous lifting or the use of fluoroquinolone antibiotics.  We will plan to see him back in 1 year with a follow-up CTA of the chest   Grace Isaac MD      [address] [place] [address] Office [phone]   [place]eeper [phone]  01/10/2019 3:33 PM

## 2019-01-16 DIAGNOSIS — H35373 Puckering of macula, bilateral: Secondary | ICD-10-CM | POA: Diagnosis not present

## 2019-01-16 DIAGNOSIS — Z961 Presence of intraocular lens: Secondary | ICD-10-CM | POA: Diagnosis not present

## 2019-01-16 DIAGNOSIS — H01021 Squamous blepharitis right upper eyelid: Secondary | ICD-10-CM | POA: Diagnosis not present

## 2019-01-16 DIAGNOSIS — H01024 Squamous blepharitis left upper eyelid: Secondary | ICD-10-CM | POA: Diagnosis not present

## 2019-03-12 ENCOUNTER — Other Ambulatory Visit: Payer: Self-pay | Admitting: Cardiovascular Disease

## 2019-04-08 DIAGNOSIS — I712 Thoracic aortic aneurysm, without rupture: Secondary | ICD-10-CM | POA: Diagnosis not present

## 2019-04-08 DIAGNOSIS — Z0000 Encounter for general adult medical examination without abnormal findings: Secondary | ICD-10-CM | POA: Diagnosis not present

## 2019-04-08 DIAGNOSIS — Z8679 Personal history of other diseases of the circulatory system: Secondary | ICD-10-CM | POA: Diagnosis not present

## 2019-04-08 DIAGNOSIS — E782 Mixed hyperlipidemia: Secondary | ICD-10-CM | POA: Diagnosis not present

## 2019-04-15 ENCOUNTER — Inpatient Hospital Stay (HOSPITAL_COMMUNITY)
Admission: EM | Admit: 2019-04-15 | Discharge: 2019-04-20 | DRG: 481 | Disposition: A | Discharge status: Skilled Nursing Facility | Payer: Medicare Other | Attending: Internal Medicine | Admitting: Internal Medicine

## 2019-04-15 ENCOUNTER — Emergency Department (HOSPITAL_COMMUNITY): Payer: Medicare Other

## 2019-04-15 ENCOUNTER — Other Ambulatory Visit: Payer: Self-pay

## 2019-04-15 ENCOUNTER — Inpatient Hospital Stay (HOSPITAL_COMMUNITY): Payer: Medicare Other

## 2019-04-15 DIAGNOSIS — I10 Essential (primary) hypertension: Secondary | ICD-10-CM | POA: Diagnosis not present

## 2019-04-15 DIAGNOSIS — S72009A Fracture of unspecified part of neck of unspecified femur, initial encounter for closed fracture: Secondary | ICD-10-CM

## 2019-04-15 DIAGNOSIS — Y92009 Unspecified place in unspecified non-institutional (private) residence as the place of occurrence of the external cause: Secondary | ICD-10-CM

## 2019-04-15 DIAGNOSIS — I2510 Atherosclerotic heart disease of native coronary artery without angina pectoris: Secondary | ICD-10-CM | POA: Diagnosis not present

## 2019-04-15 DIAGNOSIS — Z923 Personal history of irradiation: Secondary | ICD-10-CM

## 2019-04-15 DIAGNOSIS — Z7982 Long term (current) use of aspirin: Secondary | ICD-10-CM

## 2019-04-15 DIAGNOSIS — S72142A Displaced intertrochanteric fracture of left femur, initial encounter for closed fracture: Secondary | ICD-10-CM | POA: Diagnosis not present

## 2019-04-15 DIAGNOSIS — E860 Dehydration: Secondary | ICD-10-CM | POA: Diagnosis present

## 2019-04-15 DIAGNOSIS — Z03818 Encounter for observation for suspected exposure to other biological agents ruled out: Secondary | ICD-10-CM | POA: Diagnosis not present

## 2019-04-15 DIAGNOSIS — I723 Aneurysm of iliac artery: Secondary | ICD-10-CM | POA: Diagnosis present

## 2019-04-15 DIAGNOSIS — S72145A Nondisplaced intertrochanteric fracture of left femur, initial encounter for closed fracture: Secondary | ICD-10-CM | POA: Diagnosis not present

## 2019-04-15 DIAGNOSIS — R2689 Other abnormalities of gait and mobility: Secondary | ICD-10-CM | POA: Diagnosis not present

## 2019-04-15 DIAGNOSIS — I712 Thoracic aortic aneurysm, without rupture: Secondary | ICD-10-CM | POA: Diagnosis not present

## 2019-04-15 DIAGNOSIS — I739 Peripheral vascular disease, unspecified: Secondary | ICD-10-CM | POA: Diagnosis not present

## 2019-04-15 DIAGNOSIS — I5042 Chronic combined systolic (congestive) and diastolic (congestive) heart failure: Secondary | ICD-10-CM | POA: Diagnosis not present

## 2019-04-15 DIAGNOSIS — M2550 Pain in unspecified joint: Secondary | ICD-10-CM | POA: Diagnosis not present

## 2019-04-15 DIAGNOSIS — Z419 Encounter for procedure for purposes other than remedying health state, unspecified: Secondary | ICD-10-CM

## 2019-04-15 DIAGNOSIS — M79605 Pain in left leg: Secondary | ICD-10-CM | POA: Diagnosis not present

## 2019-04-15 DIAGNOSIS — I110 Hypertensive heart disease with heart failure: Secondary | ICD-10-CM | POA: Diagnosis not present

## 2019-04-15 DIAGNOSIS — Z20828 Contact with and (suspected) exposure to other viral communicable diseases: Secondary | ICD-10-CM | POA: Diagnosis not present

## 2019-04-15 DIAGNOSIS — Z4789 Encounter for other orthopedic aftercare: Secondary | ICD-10-CM | POA: Diagnosis not present

## 2019-04-15 DIAGNOSIS — H9193 Unspecified hearing loss, bilateral: Secondary | ICD-10-CM | POA: Diagnosis present

## 2019-04-15 DIAGNOSIS — K219 Gastro-esophageal reflux disease without esophagitis: Secondary | ICD-10-CM | POA: Diagnosis not present

## 2019-04-15 DIAGNOSIS — I4510 Unspecified right bundle-branch block: Secondary | ICD-10-CM | POA: Diagnosis not present

## 2019-04-15 DIAGNOSIS — R5381 Other malaise: Secondary | ICD-10-CM | POA: Diagnosis not present

## 2019-04-15 DIAGNOSIS — S72142D Displaced intertrochanteric fracture of left femur, subsequent encounter for closed fracture with routine healing: Secondary | ICD-10-CM | POA: Diagnosis not present

## 2019-04-15 DIAGNOSIS — I724 Aneurysm of artery of lower extremity: Secondary | ICD-10-CM | POA: Diagnosis present

## 2019-04-15 DIAGNOSIS — W010XXA Fall on same level from slipping, tripping and stumbling without subsequent striking against object, initial encounter: Secondary | ICD-10-CM | POA: Diagnosis present

## 2019-04-15 DIAGNOSIS — Z66 Do not resuscitate: Secondary | ICD-10-CM | POA: Diagnosis not present

## 2019-04-15 DIAGNOSIS — W19XXXA Unspecified fall, initial encounter: Secondary | ICD-10-CM | POA: Diagnosis not present

## 2019-04-15 DIAGNOSIS — I4891 Unspecified atrial fibrillation: Secondary | ICD-10-CM | POA: Diagnosis not present

## 2019-04-15 DIAGNOSIS — E782 Mixed hyperlipidemia: Secondary | ICD-10-CM | POA: Diagnosis not present

## 2019-04-15 DIAGNOSIS — W19XXXD Unspecified fall, subsequent encounter: Secondary | ICD-10-CM | POA: Diagnosis not present

## 2019-04-15 DIAGNOSIS — Z8711 Personal history of peptic ulcer disease: Secondary | ICD-10-CM

## 2019-04-15 DIAGNOSIS — D649 Anemia, unspecified: Secondary | ICD-10-CM | POA: Diagnosis present

## 2019-04-15 DIAGNOSIS — Z79899 Other long term (current) drug therapy: Secondary | ICD-10-CM

## 2019-04-15 DIAGNOSIS — I252 Old myocardial infarction: Secondary | ICD-10-CM

## 2019-04-15 DIAGNOSIS — M6281 Muscle weakness (generalized): Secondary | ICD-10-CM | POA: Diagnosis not present

## 2019-04-15 DIAGNOSIS — S79912A Unspecified injury of left hip, initial encounter: Secondary | ICD-10-CM | POA: Diagnosis not present

## 2019-04-15 DIAGNOSIS — Z7401 Bed confinement status: Secondary | ICD-10-CM | POA: Diagnosis not present

## 2019-04-15 DIAGNOSIS — Z951 Presence of aortocoronary bypass graft: Secondary | ICD-10-CM

## 2019-04-15 DIAGNOSIS — I5022 Chronic systolic (congestive) heart failure: Secondary | ICD-10-CM | POA: Diagnosis not present

## 2019-04-15 DIAGNOSIS — R41841 Cognitive communication deficit: Secondary | ICD-10-CM | POA: Diagnosis not present

## 2019-04-15 DIAGNOSIS — Z8546 Personal history of malignant neoplasm of prostate: Secondary | ICD-10-CM | POA: Diagnosis not present

## 2019-04-15 DIAGNOSIS — I872 Venous insufficiency (chronic) (peripheral): Secondary | ICD-10-CM | POA: Diagnosis not present

## 2019-04-15 DIAGNOSIS — K5900 Constipation, unspecified: Secondary | ICD-10-CM | POA: Diagnosis not present

## 2019-04-15 DIAGNOSIS — Z91048 Other nonmedicinal substance allergy status: Secondary | ICD-10-CM

## 2019-04-15 DIAGNOSIS — S72145D Nondisplaced intertrochanteric fracture of left femur, subsequent encounter for closed fracture with routine healing: Secondary | ICD-10-CM | POA: Diagnosis not present

## 2019-04-15 DIAGNOSIS — N179 Acute kidney failure, unspecified: Secondary | ICD-10-CM | POA: Diagnosis not present

## 2019-04-15 DIAGNOSIS — Z87891 Personal history of nicotine dependence: Secondary | ICD-10-CM

## 2019-04-15 DIAGNOSIS — Z01810 Encounter for preprocedural cardiovascular examination: Secondary | ICD-10-CM | POA: Diagnosis not present

## 2019-04-15 DIAGNOSIS — Z01818 Encounter for other preprocedural examination: Secondary | ICD-10-CM | POA: Diagnosis not present

## 2019-04-15 DIAGNOSIS — Z881 Allergy status to other antibiotic agents status: Secondary | ICD-10-CM

## 2019-04-15 DIAGNOSIS — E785 Hyperlipidemia, unspecified: Secondary | ICD-10-CM | POA: Diagnosis not present

## 2019-04-15 DIAGNOSIS — R0902 Hypoxemia: Secondary | ICD-10-CM | POA: Diagnosis not present

## 2019-04-15 DIAGNOSIS — M25552 Pain in left hip: Secondary | ICD-10-CM | POA: Diagnosis not present

## 2019-04-15 DIAGNOSIS — M1711 Unilateral primary osteoarthritis, right knee: Secondary | ICD-10-CM | POA: Diagnosis not present

## 2019-04-15 DIAGNOSIS — Z7189 Other specified counseling: Secondary | ICD-10-CM

## 2019-04-15 DIAGNOSIS — Z8679 Personal history of other diseases of the circulatory system: Secondary | ICD-10-CM

## 2019-04-15 HISTORY — DX: Fracture of unspecified part of neck of unspecified femur, initial encounter for closed fracture: S72.009A

## 2019-04-15 LAB — BASIC METABOLIC PANEL
Anion gap: 8 (ref 5–15)
BUN: 30 mg/dL — ABNORMAL HIGH (ref 8–23)
CO2: 22 mmol/L (ref 22–32)
Calcium: 9.4 mg/dL (ref 8.9–10.3)
Chloride: 107 mmol/L (ref 98–111)
Creatinine, Ser: 1.18 mg/dL (ref 0.61–1.24)
GFR calc Af Amer: >60 mL/min (ref >60)
GFR calc non Af Amer: 55 mL/min — ABNORMAL LOW (ref >60)
Glucose, Bld: 112 mg/dL — ABNORMAL HIGH (ref 70–99)
Potassium: 3.7 mmol/L (ref 3.5–5.1)
Sodium: 137 mmol/L (ref 135–145)

## 2019-04-15 LAB — CBC
HCT: 37.6 % — ABNORMAL LOW (ref 39.0–52.0)
Hemoglobin: 11.9 g/dL — ABNORMAL LOW (ref 13.0–17.0)
MCH: 29.5 pg (ref 26.0–34.0)
MCHC: 31.6 g/dL (ref 30.0–36.0)
MCV: 93.3 fL (ref 80.0–100.0)
Platelets: 192 10*3/uL (ref 150–400)
RBC: 4.03 MIL/uL — ABNORMAL LOW (ref 4.22–5.81)
RDW: 13.5 % (ref 11.5–15.5)
WBC: 11.1 10*3/uL — ABNORMAL HIGH (ref 4.0–10.5)
nRBC: 0 % (ref 0.0–0.2)

## 2019-04-15 LAB — PROTIME-INR
INR: 1.2 (ref 0.8–1.2)
Prothrombin Time: 15.2 seconds (ref 11.4–15.2)

## 2019-04-15 LAB — SURGICAL PCR SCREEN
MRSA, PCR: NEGATIVE
Staphylococcus aureus: NEGATIVE

## 2019-04-15 LAB — TROPONIN I (HIGH SENSITIVITY)
Troponin I (High Sensitivity): 8 ng/L (ref <18)
Troponin I (High Sensitivity): 10 ng/L (ref <18)

## 2019-04-15 LAB — SARS CORONAVIRUS 2 BY RT PCR (HOSPITAL ORDER, PERFORMED IN ~~LOC~~ HOSPITAL LAB): SARS Coronavirus 2: NEGATIVE

## 2019-04-15 MED ORDER — SODIUM CHLORIDE 0.9 % IV SOLN
INTRAVENOUS | Status: DC
  Administered 2019-04-15 – 2019-04-16 (×2): via INTRAVENOUS

## 2019-04-15 MED ORDER — MORPHINE SULFATE (PF) 2 MG/ML IV SOLN
0.5000 mg | INTRAVENOUS | Status: DC | PRN
  Administered 2019-04-17: 0.5 mg via INTRAVENOUS
  Filled 2019-04-15: qty 1

## 2019-04-15 MED ORDER — CARVEDILOL 12.5 MG PO TABS
12.5000 mg | ORAL_TABLET | Freq: Two times a day (BID) | ORAL | Status: DC
  Administered 2019-04-16: 09:00:00 12.5 mg via ORAL
  Filled 2019-04-15 (×3): qty 1

## 2019-04-15 MED ORDER — SODIUM CHLORIDE 0.9 % IV BOLUS
1000.0000 mL | Freq: Once | INTRAVENOUS | Status: AC
  Administered 2019-04-15: 1000 mL via INTRAVENOUS

## 2019-04-15 MED ORDER — MORPHINE SULFATE (PF) 2 MG/ML IV SOLN
2.0000 mg | Freq: Once | INTRAVENOUS | Status: AC
  Administered 2019-04-15: 2 mg via INTRAVENOUS
  Filled 2019-04-15: qty 1

## 2019-04-15 MED ORDER — LOSARTAN POTASSIUM 50 MG PO TABS: 50.0000 mg | ORAL_TABLET | Freq: Every day | ORAL | Status: DC

## 2019-04-15 MED ORDER — ENOXAPARIN SODIUM 40 MG/0.4ML ~~LOC~~ SOLN: 40.0000 mg | SUBCUTANEOUS | Status: DC

## 2019-04-15 NOTE — ED Provider Notes (Signed)
[facility] DEPT Provider Note   CSN: [id number] Arrival date & time: 04/15/19  [id number]     History   Chief Complaint Chief Complaint  Patient presents with  . Hip Pain    HPI Jesse [name] is a 86 y.o. male.     HPI Patient presents the emergency room for evaluation of hip pain.  Patient states he had a fall last night.  Patient was able to get up and bear weight after the fall but has had significant pain in his left hip since then.  Patient denies any other injuries.  He denies any fevers or chills.  No vomiting or diarrhea.   Past Medical History:  Diagnosis Date  . AAA (abdominal aortic aneurysm) ([place])   . Arthritis   . Bronchitis    hx of appro 40 years ago  . Cancer ([place]) 2010   prostate ( 40 Txs. of radiation treatments)  . Coronary artery disease   . Environmental allergies   . GERD (gastroesophageal reflux disease)   . HOH (hard of hearing)    wears bilateral hearing aids  . Hyperlipidemia   . Hypertension   . Kidney stone 1968  . Myocardial infarction ([place]) 1992  . Pneumonia    hx of  . RBBB     Patient Active Problem List   Diagnosis Date Noted  . Coronary artery disease involving native coronary artery of native heart without angina pectoris 11/20/2018  . Thoracic aortic aneurysm without rupture ([place]) 11/20/2018  . Chronic systolic heart failure ([place]) 11/20/2018  . Duodenal ulcer with hemorrhage 05/05/2018  . Hemorrhagic shock ([place]) 05/05/2018  . AKI (acute kidney injury) ([place]) 05/05/2018  . Orthostatic hypotension 05/04/2018  . Symptomatic anemia 05/04/2018  . Melena 05/04/2018  . GI bleed 05/04/2018  . Patella fracture 06/09/2015  . Aftercare following surgery of the circulatory system, [place] 01/08/2013  . Pain in limb- Left popliteal 12/25/2012  . 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 Aneurysm of abdominal vessel ([place]) 02/21/2012  . Aneurysm of iliac artery ([facility])  08/09/2011  . Aneurysm of artery of lower extremity ([place]) 08/09/2011  . [place], [place] 01/16/2009  . Essential hypertension 01/16/2009  . CAD, ARTERY BYPASS GRAFT 01/16/2009  . PERIPHERAL VASCULAR DISEASE 01/16/2009  . ABDOMINAL AORTIC ANEURYSM REPAIR, HX OF 01/16/2009  . MIXED HYPERLIPIDEMIA 12/28/2008    Past Surgical History:  Procedure Laterality Date  . ABDOMINAL AORTIC ANEURYSM REPAIR  08-13-1993   Dr Cameron Sprang  . CARDIAC CATHETERIZATION    . COLONOSCOPY W/ POLYPECTOMY    . CORONARY ARTERY BYPASS GRAFT  03-01-1991   Dr. Servando Snare  . ESOPHAGOGASTRODUODENOSCOPY (EGD) WITH PROPOFOL N/A 05/04/2018   Procedure: ESOPHAGOGASTRODUODENOSCOPY (EGD) WITH PROPOFOL;  Surgeon: [name], MD;  Location: [facility] ENDOSCOPY;  Service: Gastroenterology;  Laterality: N/A;  . EYE SURGERY     Detached retina (Left eye); bilateral cataract removal  . FALSE ANEURYSM REPAIR  05/07/2012   Procedure: REPAIR FALSE ANEURYSM;  Surgeon: Rosetta Posner, MD;  Location[facility];  Service: Vascular;  Laterality: Left;  Repair of Left Femoral Artery Aneurysm  . HERNIA REPAIR    . HOT HEMOSTASIS N/A 05/04/2018   Procedure: HOT HEMOSTASIS (ARGON PLASMA COAGULATION/BICAP);  Surgeon: [name], MD;  Location: [facility];  Service: Gastroenterology;  Laterality: N/A;  . ORIF PATELLA Right 06/09/2015   Procedure: OPEN REDUCTION INTERNAL (ORIF) FIXATION PATELLA;  Surgeon: Marybelle Killings, MD;  Location: [facility];  Service: Orthopedics;  Laterality: Right;  . repair of bowel obstruction  1999   Dr. Dalbert Batman  . repair of ventral hernia  04-20-1994   P. Cameron Sprang MD  . resection femoral artery  03/19/2012   Dr. Curt Jews  . SUBMUCOSAL INJECTION  05/04/2018   Procedure: SUBMUCOSAL INJECTION;  Surgeon: [name], MD;  Location: [facility];  Service: Gastroenterology;;        Home Medications    Prior to Admission medications   Medication Sig Start Date End Date Taking? Authorizing Provider   acetaminophen (TYLENOL) 325 MG tablet Take 650 mg by mouth every 6 (six) hours as needed for mild pain.   Yes [provider]  acetaminophen (TYLENOL) 500 MG tablet Take 500 mg by mouth every 6 (six) hours as needed for mild pain or headache.   Yes [provider]  aspirin 81 MG tablet Take 81 mg by mouth daily.   Yes [provider]  carboxymethylcellulose (REFRESH PLUS) 0.5 % SOLN Apply 1-2 drops to eye at bedtime.    Yes [provider]  carvedilol (COREG) 12.5 MG tablet TAKE 1 TABLET BY MOUTH 2 TIMES DAILY WITH A MEAL Patient taking differently: Take 12.5 mg by mouth 2 (two) times daily with a meal.  06/27/18  Yes [name], MD  Cyanocobalamin (B-12 PO) Take 1 tablet by mouth daily.   Yes [provider]  fenofibrate 160 MG tablet TAKE 1 TABLET BY MOUTH DAILY Patient taking differently: Take 160 mg by mouth daily.  06/27/18  Yes [name], MD  hydrochlorothiazide (HYDRODIURIL) 12.5 MG tablet Take 1 tablet (12.5 mg total) by mouth daily. 06/27/18  Yes [name], MD  hydrocortisone 2.5 % cream Apply 1 application topically as needed (for dry ears).  08/16/18  Yes [provider]  loratadine (CLARITIN) 10 MG tablet Take 10 mg by mouth daily.    Yes [provider]  losartan (COZAAR) 50 MG tablet TAKE ONE TABLET BY MOUTH DAILY. [place] COZAAR Patient taking differently: Take 50 mg by mouth daily.  03/13/19  Yes [name], MD  pantoprazole (PROTONIX) 40 MG tablet Take 1 tablet (40 mg total) by mouth 2 (two) times daily. Patient taking differently: Take 40 mg by mouth daily.  05/08/18  Yes Charlynne Cousins, MD  simvastatin (ZOCOR) 40 MG tablet Take 1 tablet (40 mg total) by mouth daily at 6 PM. 05/21/18  Yes [name], MD    Family History Family History  Problem Relation Age of Onset  . Cancer Mother   . Cancer Father   . Varicose Veins Father   . Cancer Sister   . Diabetes Sister   .  Heart attack Sister     Social History Social History   Tobacco Use  . Smoking status: Former Smoker    Years: 20.00    Types: Cigarettes    Quit date: 07/25/1968    Years since quitting: 50.7  . Smokeless tobacco: Never Used  Substance Use Topics  . Alcohol use: No  . Drug use: No     Allergies   Quinolones, Adhesive [tape], and Lactose intolerance (gi)   Review of Systems Review of Systems  All other systems reviewed and are negative.    Physical Exam Updated Vital Signs BP (!) 96/53   Pulse 72   Temp 99 F (37.2 C) (Oral)   Resp 16   Ht 1.829 m (6')   Wt 80.3 kg   SpO2 95%  BMI 24.01 kg/m   Physical Exam Vitals signs and nursing note reviewed.  Constitutional:      General: He is not in acute distress.    Appearance: He is well-developed.  HENT:     Head: Normocephalic and atraumatic.     Right Ear: External ear normal.     Left Ear: External ear normal.  Eyes:     General: No scleral icterus.       Right eye: No discharge.        Left eye: No discharge.     Conjunctiva/sclera: Conjunctivae normal.  Neck:     Musculoskeletal: Neck supple.     Trachea: No tracheal deviation.  Cardiovascular:     Rate and Rhythm: Normal rate and regular rhythm.  Pulmonary:     Effort: Pulmonary effort is normal. No respiratory distress.     Breath sounds: Normal breath sounds. No stridor. No wheezing or rales.  Abdominal:     General: Bowel sounds are normal. There is no distension.     Palpations: Abdomen is soft.     Tenderness: There is no abdominal tenderness. There is no guarding or rebound.  Musculoskeletal:        General: Tenderness present.     Left hip: He exhibits tenderness and bony tenderness. He exhibits no swelling and no deformity.  Skin:    General: Skin is warm and dry.     Findings: No rash.  Neurological:     Mental Status: He is alert.     Cranial Nerves: No cranial nerve deficit (no facial droop, extraocular movements intact, no slurred  speech).     Sensory: No sensory deficit.     Motor: No abnormal muscle tone or seizure activity.     Coordination: Coordination normal.      ED Treatments / Results  Labs (all labs ordered are listed, but only abnormal results are displayed) Labs Reviewed  CBC - Abnormal; Notable for the following components:      Result Value   WBC 11.1 (*)    RBC 4.03 (*)    Hemoglobin 11.9 (*)    HCT 37.6 (*)    All other components within normal limits  BASIC METABOLIC PANEL - Abnormal; Notable for the following components:   Glucose, Bld 112 (*)    BUN 30 (*)    GFR calc non Af Amer 55 (*)    All other components within normal limits    EKG None  Radiology Dg Hip Unilat With Pelvis 2-3 Views Left  Result Date: 04/15/2019 CLINICAL DATA:  Fall with left-sided hip pain. EXAM: DG HIP (WITH OR WITHOUT PELVIS) 2-3V LEFT COMPARISON:  None. FINDINGS: Subtle irregularity of the lesser trochanter is noted on dedicated views of the left hip. Right hip is located. Patient is osteopenic with spinal degenerative changes and signs of vascular disease with postoperative changes and vascular calcifications overlying the hip and pelvis in various locations. The left hip is located. IMPRESSION: Question subtle left intertrochanteric fracture in this osteopenic patient. Further imaging with MRI may be helpful. Electronically Signed   By: Zetta Bills M.D.   On: 04/15/2019 10:07    Procedures Procedures (including critical care time)  Medications Ordered in ED Medications - No data to display   Initial Impression / Assessment and Plan / ED Course  I have reviewed the triage vital signs and the nursing notes.  Pertinent labs & imaging results that were available during my care of the patient were  reviewed by me and considered in my medical decision making (see chart for details).  Clinical Course as of Apr 14 1514  Mon Apr 15, 2019  1103 Pt has seen Dr Lorin Mercy in the past   [JK]  1112 Plain films  cannot rule out a hip fracture.  We will proceed with MRI   [JK]    Clinical Course User Index [JK] Dorie Rank, MD     Labs reviewed.  No significant abnormalities.   MRI pending.  Care turned over to Dr Langston Masker to dispo following MRI result.   Final Clinical Impressions(s) / ED Diagnoses  pending   Dorie Rank, MD 04/15/19 [phone]

## 2019-04-15 NOTE — H&P (Signed)
History and Physical    JERAL BYINGTON [id number] DOB: Apr 27, 1928 DOA: 04/15/2019  PCP: [name], MD Patient coming from home  Chief Complaint: Left hip pain  HPI: Jesse [name] is a 85 y.o. male with medical history significant of CAD, hypertension, MI, right bundle branch block, AAA, prostate cancer, came in with complaints of left hip pain after falling last night.  Patient does not know how he fell however he thinks that he is slipped on his home shoes or his feet got caught on his home shoes and he slipped and fell.  According to his son he always walks dragging his feet which is normal for him.  He denies any chest pain shortness of breath nausea vomiting diarrhea.  He denies any fever chills cough.  At baseline he stays home watches TV all night and sleeps all day.  He sees Dr. early for vascular issues and aneurysms.  He denies any urinary complaints.  ED Course: Blood pressure 96/63 pulse is 7595% on room air.  Sodium 137 potassium 3.7 BUN 30 creatinine 1.18 white count 11.1 hemoglobin 11.9 platelet count 192.  MRI of the hip shows incomplete nondisplaced intertrochanteric fracture of the proximal left femur intramuscular hematoma of the left gluteus maximus muscle.  COVID-19 pending.  Son reports that he does not leave his house and go anywhere.  He has not had any sick contacts.  Review of Systems: As per HPI otherwise all other systems reviewed and are negative  Ambulatory Status he is ambulatory at baseline and he lives alone.  Past Medical History:  Diagnosis Date   AAA (abdominal aortic aneurysm) (HCC)    Arthritis    Bronchitis    hx of appro 40 years ago   Cancer ([place]) 2010   prostate ( 40 Txs. of radiation treatments)   Coronary artery disease    Environmental allergies    GERD (gastroesophageal reflux disease)    HOH (hard of hearing)    wears bilateral hearing aids   Hyperlipidemia    Hypertension    Kidney stone 1968   Myocardial infarction ([place])  1992   Pneumonia    hx of   RBBB     Past Surgical History:  Procedure Laterality Date   ABDOMINAL AORTIC ANEURYSM REPAIR  08-13-1993   Dr Cameron Sprang   CARDIAC CATHETERIZATION     COLONOSCOPY W/ POLYPECTOMY     CORONARY ARTERY BYPASS GRAFT  03-01-1991   Dr. Servando Snare   ESOPHAGOGASTRODUODENOSCOPY (EGD) WITH PROPOFOL N/A 05/04/2018   Procedure: ESOPHAGOGASTRODUODENOSCOPY (EGD) WITH PROPOFOL;  Surgeon: [name], MD;  Location: [facility];  Service: Gastroenterology;  Laterality: N/A;   EYE SURGERY     Detached retina (Left eye); bilateral cataract removal   FALSE ANEURYSM REPAIR  05/07/2012   Procedure: REPAIR FALSE ANEURYSM;  Surgeon: Rosetta Posner, MD;  Location: [facility] OR;  Service: Vascular;  Laterality: Left;  Repair of Left Femoral Artery Aneurysm   HERNIA REPAIR     HOT HEMOSTASIS N/A 05/04/2018   Procedure: HOT HEMOSTASIS (ARGON PLASMA COAGULATION/BICAP);  Surgeon: [name], MD;  Location: [facility];  Service: Gastroenterology;  Laterality: N/A;   ORIF PATELLA Right 06/09/2015   Procedure: OPEN REDUCTION INTERNAL (ORIF) FIXATION PATELLA;  Surgeon: Marybelle Killings, MD;  Location: [facility];  Service: Orthopedics;  Laterality: Right;   repair of bowel obstruction  1999   Dr. Dalbert Batman   repair of ventral hernia  04-20-1994   P. Cameron Sprang MD   resection femoral  artery  03/19/2012   Dr. [name] Early   [place] INJECTION  05/04/2018   Procedure: SUBMUCOSAL INJECTION;  Surgeon: [name], MD;  Location: [facility];  Service: Gastroenterology;;    Social History   Socioeconomic History   Marital status: Widowed    Spouse name: Not on file   Number of children: Not on file   Years of education: Not on file   Highest education level: Not on file  Occupational History   Not on file  Social Needs   Financial resource strain: Not on file   Food insecurity    Worry: Not on file    Inability: Not on file   Transportation needs    Medical: Not on  file    Non-medical: Not on file  Tobacco Use   Smoking status: Former Smoker    Years: 20.00    Types: Cigarettes    Quit date: 07/25/1968    Years since quitting: 50.7   Smokeless tobacco: Never Used  Substance and Sexual Activity   Alcohol use: No   Drug use: No   Sexual activity: Not on file  Lifestyle   Physical activity    Days per week: Not on file    Minutes per session: Not on file   Stress: Not on file  Relationships   Social connections    Talks on phone: Not on file    Gets together: Not on file    Attends religious service: Not on file    Active member of club or organization: Not on file    Attends meetings of clubs or organizations: Not on file    Relationship status: Not on file   Intimate partner violence    Fear of current or ex partner: Not on file    Emotionally abused: Not on file    Physically abused: Not on file    Forced sexual activity: Not on file  Other Topics Concern   Not on file  Social History Narrative   Not on file    Allergies  Allergen Reactions   Quinolones    Adhesive [Tape] Itching and Rash   Lactose Intolerance (Gi) Other (See Comments)    Gas, bloating, stomach indigestion     Family History  Problem Relation Age of Onset   Cancer Mother    Cancer Father    Varicose Veins Father    Cancer Sister    Diabetes Sister    Heart attack Sister     Prior to Admission medications   Medication Sig Start Date End Date Taking? Authorizing Provider  acetaminophen (TYLENOL) 325 MG tablet Take 650 mg by mouth every 6 (six) hours as needed for mild pain.   Yes [provider]  acetaminophen (TYLENOL) 500 MG tablet Take 500 mg by mouth every 6 (six) hours as needed for mild pain or headache.   Yes [provider]  aspirin 81 MG tablet Take 81 mg by mouth daily.   Yes [provider]  carboxymethylcellulose (REFRESH PLUS) 0.5 % SOLN Apply 1-2 drops to eye at bedtime.    Yes [provider]  carvedilol (COREG) 12.5 MG tablet TAKE 1 TABLET BY MOUTH 2 TIMES DAILY WITH A MEAL Patient taking differently: Take 12.5 mg by mouth 2 (two) times daily with a meal.  06/27/18  Yes [name], MD  Cyanocobalamin (B-12 PO) Take 1 tablet by mouth daily.   Yes [provider]  fenofibrate 160 MG tablet TAKE 1 TABLET BY MOUTH  DAILY Patient taking differently: Take 160 mg by mouth daily.  06/27/18  Yes [name], MD  hydrochlorothiazide (HYDRODIURIL) 12.5 MG tablet Take 1 tablet (12.5 mg total) by mouth daily. 06/27/18  Yes [name], MD  hydrocortisone 2.5 % cream Apply 1 application topically as needed (for dry ears).  08/16/18  Yes [provider]  loratadine (CLARITIN) 10 MG tablet Take 10 mg by mouth daily.    Yes [provider]  losartan (COZAAR) 50 MG tablet TAKE ONE TABLET BY MOUTH DAILY. [place] COZAAR Patient taking differently: Take 50 mg by mouth daily.  03/13/19  Yes [name], MD  pantoprazole (PROTONIX) 40 MG tablet Take 1 tablet (40 mg total) by mouth 2 (two) times daily. Patient taking differently: Take 40 mg by mouth daily.  05/08/18  Yes Charlynne Cousins, MD  simvastatin (ZOCOR) 40 MG tablet Take 1 tablet (40 mg total) by mouth daily at 6 PM. 05/21/18  Yes [name], MD    Physical Exam: Vitals:   04/15/19 1330 04/15/19 1400 04/15/19 1429 04/15/19 1634  BP: 101/65 (!) 96/53 (!) 96/53 96/63  Pulse: 69 71 72 75  Resp: 16 15 16 18   Temp:      TempSrc:      SpO2: 95% 93% 95% 95%  Weight:      Height:          General:  Appears calm and comfortable  Eyes:  PERRL, EOMI, normal lids, iris  ENT:  grossly normal hearing, lips & tongue, mucous membranes dry.  Neck: o LAD, masses or thyromegaly  Cardiovascular: RR, no m/r/g. No LE edema.   Respiratory:  CTA bilaterally, no w/r/r. Normal respiratory effort.  Abdomen:  soft, ntnd, NABS  Skin:  no rash or induration seen on limited  exam  Musculoskeletal: Left hip tender   psychiatric:  grossly normal mood and affect, speech fluent and appropriate, AOx3  Neurologic:  CN 2-12 grossly intact, moves all extremities in coordinated fashion, sensation intact  Labs on Admission: I have personally reviewed following labs and imaging studies  CBC: Recent Labs  Lab 04/15/19 1218  WBC 11.1*  HGB 11.9*  HCT 37.6*  MCV 93.3  PLT [id number]   Basic Metabolic Panel: Recent Labs  Lab 04/15/19 1218  NA 137  K 3.7  CL 107  CO2 22  GLUCOSE 112*  BUN 30*  CREATININE 1.18  CALCIUM 9.4   GFR: Estimated Creatinine Clearance: 47.5 mL/min (by C-G formula based on SCr of 1.18 mg/dL). Liver Function Tests: No results for input(s): AST, ALT, ALKPHOS, BILITOT, PROT, ALBUMIN in the last 168 hours. No results for input(s): LIPASE, AMYLASE in the last 168 hours. No results for input(s): AMMONIA in the last 168 hours. Coagulation Profile: No results for input(s): INR, PROTIME in the last 168 hours. Cardiac Enzymes: No results for input(s): CKTOTAL, CKMB, CKMBINDEX, TROPONINI in the last 168 hours. BNP (last 3 results) No results for input(s): PROBNP in the last 8760 hours. HbA1C: No results for input(s): HGBA1C in the last 72 hours. CBG: No results for input(s): GLUCAP in the last 168 hours. Lipid Profile: No results for input(s): CHOL, HDL, LDLCALC, TRIG, CHOLHDL, LDLDIRECT in the last 72 hours. Thyroid Function Tests: No results for input(s): TSH, T4TOTAL, FREET4, T3FREE, THYROIDAB in the last 72 hours. Anemia Panel: No results for input(s): VITAMINB12, FOLATE, FERRITIN, TIBC, IRON, RETICCTPCT in the last 72 hours. Urine analysis:    Component Value Date/Time   COLORURINE YELLOW 05/04/2018 2140  APPEARANCEUR CLEAR 05/04/2018 2140   LABSPEC 1.028 05/04/2018 2140   PHURINE 6.0 05/04/2018 2140   GLUCOSEU NEGATIVE 05/04/2018 2140   HGBUR NEGATIVE 05/04/2018 2140   BILIRUBINUR NEGATIVE 05/04/2018 2140   KETONESUR NEGATIVE  05/04/2018 2140   PROTEINUR NEGATIVE 05/04/2018 2140   UROBILINOGEN 2.0 (H) 05/28/2015 0029   NITRITE NEGATIVE 05/04/2018 2140   LEUKOCYTESUR NEGATIVE 05/04/2018 2140    Creatinine Clearance: Estimated Creatinine Clearance: 47.5 mL/min (by C-G formula based on SCr of 1.18 mg/dL).  Sepsis Labs: @LABRCNTIP (procalcitonin:4,lacticidven:4) )No results found for this or any previous visit (from the past 240 hour(s)).   Radiological Exams on Admission: Mr Hip Left Wo Contrast  Result Date: 04/15/2019 CLINICAL DATA:  Left hip pain secondary to a fall last night. EXAM: MR OF THE LEFT HIP WITHOUT CONTRAST TECHNIQUE: Multiplanar, multisequence MR imaging was performed. No intravenous contrast was administered. COMPARISON:  Radiographs dated 04/15/2019 and CT scan of the abdomen and pelvis dated 05/04/2018 FINDINGS: Bones: There is an incomplete nondisplaced intertrochanteric fracture of the proximal left femur extending from the tip of the left greater trochanter approximately 60% of the way through the intertrochanteric region. The other bones of the pelvis are intact. Articular cartilage and labrum Articular cartilage: Slight thinning of the articular cartilage of the posterior aspect of the joint. Labrum:  Intact. Joint or bursal effusion Joint effusion:  No joint effusion. Bursae: No bursitis. Muscles and tendons Muscles and tendons: There is a 5 x 5 x 3 cm intramuscular hematoma in the left gluteus maximus muscle posterior to the left greater trochanter. There is extensive edema in the gluteus maximus muscle. There is also edema in the left quadratus femoris muscle as well as slight edema along the left iliopsoas tendon. Other findings Miscellaneous: The patient has chronic massive internal iliac artery aneurysms, 8.6 cm on the right and 7.3 cm on the left. There appears to be blood flow in portions of each of these large aneurysms. These were demonstrated on the prior CT scan of the abdomen and pelvis  dated 05/04/2018. IMPRESSION: 1. Incomplete nondisplaced intertrochanteric fracture of the proximal left femur. 2. Strains of the left gluteus maximus, left quadratus femoris, and left iliopsoas muscles. Intramuscular hematoma of the left gluteus maximus muscle 3. Chronic massive internal iliac artery aneurysms. Electronically Signed   By: Lorriane Shire M.D.   On: 04/15/2019 15:57   Dg Hip Unilat With Pelvis 2-3 Views Left  Result Date: 04/15/2019 CLINICAL DATA:  Fall with left-sided hip pain. EXAM: DG HIP (WITH OR WITHOUT PELVIS) 2-3V LEFT COMPARISON:  None. FINDINGS: Subtle irregularity of the lesser trochanter is noted on dedicated views of the left hip. Right hip is located. Patient is osteopenic with spinal degenerative changes and signs of vascular disease with postoperative changes and vascular calcifications overlying the hip and pelvis in various locations. The left hip is located. IMPRESSION: Question subtle left intertrochanteric fracture in this osteopenic patient. Further imaging with MRI may be helpful. Electronically Signed   By: Zetta Bills M.D.   On: 04/15/2019 10:07    EKG: pvcs  Assessment/Plan Active Problems:   Hip fracture (HCC) #1 left hip fracture status post fall denies LOC.  Denies any chest pain shortness of breath nausea vomiting diarrhea.  ED physician has contacted Dr. Lyla Glassing with Ortho.  I will hold Lovenox until the surgery is done.  #2 history of CAD and CABG in the 1992-last echo I have is from 05/2018 with ejection fraction 40 to 45%.  I will order  an echocardiogram.  I will continue beta-blocker for perioperative cardiac protection.  I will consult cardiology prior to surgery.  Get troponin.  Hold aspirin.  #3 dehydration family reports he has not had anything to eat for the last 24 hours since a fall.  His urine does appear very dark and his oral mucosa is very dry.  I will give him IV fluids.  #4 history of hypertension now with soft blood pressure I will  hold antihypertensives he was taking at home.  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 Donnetta Hutching stable  #7 history of upper GI bleed with duodenal ulcer I will put him on PPI    Estimated body mass index is 24.01 kg/m as calculated from the following:   Height as of this encounter: 6' (1.829 m).   Weight as of this encounter: 80.3 kg.   DVT prophylaxis: SCD will start Lovenox after surgery Code Status: DNR Family Communication: Discussed with son Disposition Plan: Pending clinical improvement Consults called: ED physician spoke with Ortho Dr. Lyla Glassing Admission status: Inpatient   Georgette Shell MD [facility]  If 7PM-7AM, please contact night-coverage [email or web address] Password [facility]  04/15/2019, 5:43 PM

## 2019-04-15 NOTE — Consult Note (Signed)
ORTHOPAEDIC CONSULTATION  REQUESTING PHYSICIAN: Georgette Shell, MD  PCP:  [name], MD  Chief Complaint: Left hip injury  HPI: Jesse [name] is a 85 y.o. male with a history of coronary artery disease, hypertension, MI, right bundle branch block, multiple arterial aneurysms, prostate cancer who complains of left hip pain and inability to weight-bear after he tripped and fell at home yesterday.  He fell on his hip.  He has hip pain and difficulty weightbearing.  He came to the emergency department at [facility], where x-rays and MRI of the left hip revealed a nondisplaced left intertrochanteric femur fracture.  He was admitted by the hospitalist.  Orthopedic consultation was placed for management of his left hip fracture.  The patient lives at home alone.  He is ambulatory.  He sees Dr. Donnetta Hutching for his vascular issues.  Past Medical History:  Diagnosis Date   AAA (abdominal aortic aneurysm) (HCC)    Arthritis    Bronchitis    hx of appro 40 years ago   Cancer ([place]) 2010   prostate ( 40 Txs. of radiation treatments)   Coronary artery disease    Environmental allergies    GERD (gastroesophageal reflux disease)    HOH (hard of hearing)    wears bilateral hearing aids   Hyperlipidemia    Hypertension    Kidney stone 1968   Myocardial infarction ([place]) 1992   Pneumonia    hx of   RBBB    Past Surgical History:  Procedure Laterality Date   ABDOMINAL AORTIC ANEURYSM REPAIR  08-13-1993   Dr Cameron Sprang   CARDIAC CATHETERIZATION     COLONOSCOPY W/ POLYPECTOMY     CORONARY ARTERY BYPASS GRAFT  03-01-1991   Dr. Servando Snare   ESOPHAGOGASTRODUODENOSCOPY (EGD) WITH PROPOFOL N/A 05/04/2018   Procedure: ESOPHAGOGASTRODUODENOSCOPY (EGD) WITH PROPOFOL;  Surgeon: [name], MD;  Location: [facility];  Service: Gastroenterology;  Laterality: N/A;   EYE SURGERY     Detached retina (Left eye); bilateral cataract removal   FALSE ANEURYSM REPAIR   05/07/2012   Procedure: REPAIR FALSE ANEURYSM;  Surgeon: Rosetta Posner, MD;  Location: [facility] OR;  Service: Vascular;  Laterality: Left;  Repair of Left Femoral Artery Aneurysm   HERNIA REPAIR     HOT HEMOSTASIS N/A 05/04/2018   Procedure: HOT HEMOSTASIS (ARGON PLASMA COAGULATION/BICAP);  Surgeon: [name], MD;  Location: [facility];  Service: Gastroenterology;  Laterality: N/A;   ORIF PATELLA Right 06/09/2015   Procedure: OPEN REDUCTION INTERNAL (ORIF) FIXATION PATELLA;  Surgeon: Marybelle Killings, MD;  Location: [facility];  Service: Orthopedics;  Laterality: Right;   repair of bowel obstruction  1999   Dr. Dalbert Batman   repair of ventral hernia  04-20-1994   P. Cameron Sprang MD   resection femoral artery  03/19/2012   Dr. [name] [place]   [place] INJECTION  05/04/2018   Procedure: SUBMUCOSAL INJECTION;  Surgeon: [name], MD;  Location: [facility];  Service: Gastroenterology;;   Social History   Socioeconomic History   Marital status: Widowed    Spouse name: Not on file   Number of children: Not on file   Years of education: Not on file   Highest education level: Not on file  Occupational History   Not on file  Social Needs   Financial resource strain: Not on file   Food insecurity    Worry: Not on file    Inability: Not on file   Transportation needs  Medical: Not on file    Non-medical: Not on file  Tobacco Use   Smoking status: Former Smoker    Years: 20.00    Types: Cigarettes    Quit date: 07/25/1968    Years since quitting: 50.7   Smokeless tobacco: Never Used  Substance and Sexual Activity   Alcohol use: No   Drug use: No   Sexual activity: Not on file  Lifestyle   Physical activity    Days per week: Not on file    Minutes per session: Not on file   Stress: Not on file  Relationships   Social connections    Talks on phone: Not on file    Gets together: Not on file    Attends religious service: Not on file    Active member of club or  organization: Not on file    Attends meetings of clubs or organizations: Not on file    Relationship status: Not on file  Other Topics Concern   Not on file  Social History Narrative   Not on file   Family History  Problem Relation Age of Onset   Cancer Mother    Cancer Father    Varicose Veins Father    Cancer Sister    Diabetes Sister    Heart attack Sister    Allergies  Allergen Reactions   Quinolones    Adhesive [Tape] Itching and Rash   Lactose Intolerance (Gi) Other (See Comments)    Gas, bloating, stomach indigestion    Prior to Admission medications   Medication Sig Start Date End Date Taking? Authorizing Provider  acetaminophen (TYLENOL) 325 MG tablet Take 650 mg by mouth every 6 (six) hours as needed for mild pain.   Yes [provider]  acetaminophen (TYLENOL) 500 MG tablet Take 500 mg by mouth every 6 (six) hours as needed for mild pain or headache.   Yes [provider]  aspirin 81 MG tablet Take 81 mg by mouth daily.   Yes [provider]  carboxymethylcellulose (REFRESH PLUS) 0.5 % SOLN Apply 1-2 drops to eye at bedtime.    Yes [provider]  carvedilol (COREG) 12.5 MG tablet TAKE 1 TABLET BY MOUTH 2 TIMES DAILY WITH A MEAL Patient taking differently: Take 12.5 mg by mouth 2 (two) times daily with a meal.  06/27/18  Yes [name], MD  Cyanocobalamin (B-12 PO) Take 1 tablet by mouth daily.   Yes [provider]  fenofibrate 160 MG tablet TAKE 1 TABLET BY MOUTH DAILY Patient taking differently: Take 160 mg by mouth daily.  06/27/18  Yes [name], MD  hydrochlorothiazide (HYDRODIURIL) 12.5 MG tablet Take 1 tablet (12.5 mg total) by mouth daily. 06/27/18  Yes [name], MD  hydrocortisone 2.5 % cream Apply 1 application topically as needed (for dry ears).  08/16/18  Yes [provider]  loratadine (CLARITIN) 10 MG tablet Take 10 mg by mouth daily.    Yes [provider]    losartan (COZAAR) 50 MG tablet TAKE ONE TABLET BY MOUTH DAILY. [place] COZAAR Patient taking differently: Take 50 mg by mouth daily.  03/13/19  Yes [name], MD  pantoprazole (PROTONIX) 40 MG tablet Take 1 tablet (40 mg total) by mouth 2 (two) times daily. Patient taking differently: Take 40 mg by mouth daily.  05/08/18  Yes Charlynne Cousins, MD  simvastatin (ZOCOR) 40 MG tablet Take 1 tablet (40 mg total) by mouth daily at 6 PM. 05/21/18  Cristi Loron, MD   Dg Chest 1 View  Result Date: 04/15/2019 CLINICAL DATA:  Preop.  Hypertension. EXAM: CHEST  1 VIEW COMPARISON:  01/01/2016 FINDINGS: Prior CABG. Mild cardiomegaly. No confluent opacities or effusions. No acute bony abnormality. IMPRESSION: No active disease. Electronically Signed   By: Rolm Baptise M.D.   On: 04/15/2019 18:14   Dg Knee 1-2 Views Left  Result Date: 04/15/2019 CLINICAL DATA:  Hip fracture, left leg pain EXAM: LEFT KNEE - 1-2 VIEW COMPARISON:  Lower extremity CT angiogram 12/31/2012 FINDINGS: No acute fracture. No malalignment. Joint spaces are relatively preserved. No joint effusion. Extensive vascular calcifications with focal enlargement of the calcification in the region of the popliteal artery measuring 3.6 cm in diameter suggesting a popliteal artery aneurysm. Comparison to CT angiogram 12/31/2012 demonstrated a 3.0 cm aneurysm at this site. IMPRESSION: 1. No acute osseous abnormality, left knee. 2. Extensive atherosclerosis with an enlarging popliteal artery aneurysm measuring approximately 3.6 cm in diameter. Electronically Signed   By: Davina Poke M.D.   On: 04/15/2019 19:12   Dg Knee 2 Views Right  Result Date: 04/15/2019 CLINICAL DATA:  Preop EXAM: RIGHT KNEE - 1-2 VIEW COMPARISON:  06/01/2015 FINDINGS: Screws and cerclage wires noted within the patella. For mild degenerative changes in the right knee, best seen in the patellofemoral compartment with joint space narrowing and  spurring. No fracture, subluxation or dislocation. Atherosclerotic calcifications within the femoral and popliteal arteries. There appears to be a popliteal artery aneurysm measuring approximately 3 cm above the right knee (there may be magnification, over estimating the size). IMPRESSION: Postoperative changes in the patella. No acute bony abnormality. Early degenerative changes. Vascular calcifications. Concern for popliteal artery aneurysm. This could be further evaluated with ultrasound or CT if felt clinically indicated. Electronically Signed   By: Rolm Baptise M.D.   On: 04/15/2019 18:24   Mr Hip Left Wo Contrast  Result Date: 04/15/2019 CLINICAL DATA:  Left hip pain secondary to a fall last night. EXAM: MR OF THE LEFT HIP WITHOUT CONTRAST TECHNIQUE: Multiplanar, multisequence MR imaging was performed. No intravenous contrast was administered. COMPARISON:  Radiographs dated 04/15/2019 and CT scan of the abdomen and pelvis dated 05/04/2018 FINDINGS: Bones: There is an incomplete nondisplaced intertrochanteric fracture of the proximal left femur extending from the tip of the left greater trochanter approximately 60% of the way through the intertrochanteric region. The other bones of the pelvis are intact. Articular cartilage and labrum Articular cartilage: Slight thinning of the articular cartilage of the posterior aspect of the joint. Labrum:  Intact. Joint or bursal effusion Joint effusion:  No joint effusion. Bursae: No bursitis. Muscles and tendons Muscles and tendons: There is a 5 x 5 x 3 cm intramuscular hematoma in the left gluteus maximus muscle posterior to the left greater trochanter. There is extensive edema in the gluteus maximus muscle. There is also edema in the left quadratus femoris muscle as well as slight edema along the left iliopsoas tendon. Other findings Miscellaneous: The patient has chronic massive internal iliac artery aneurysms, 8.6 cm on the right and 7.3 cm on the left. There  appears to be blood flow in portions of each of these large aneurysms. These were demonstrated on the prior CT scan of the abdomen and pelvis dated 05/04/2018. IMPRESSION: 1. Incomplete nondisplaced intertrochanteric fracture of the proximal left femur. 2. Strains of the left gluteus maximus, left quadratus femoris, and left iliopsoas muscles. Intramuscular hematoma of the left gluteus maximus muscle 3. Chronic massive  internal iliac artery aneurysms. Electronically Signed   By: Lorriane Shire M.D.   On: 04/15/2019 15:57   Dg Hip Unilat With Pelvis 2-3 Views Left  Result Date: 04/15/2019 CLINICAL DATA:  Fall with left-sided hip pain. EXAM: DG HIP (WITH OR WITHOUT PELVIS) 2-3V LEFT COMPARISON:  None. FINDINGS: Subtle irregularity of the lesser trochanter is noted on dedicated views of the left hip. Right hip is located. Patient is osteopenic with spinal degenerative changes and signs of vascular disease with postoperative changes and vascular calcifications overlying the hip and pelvis in various locations. The left hip is located. IMPRESSION: Question subtle left intertrochanteric fracture in this osteopenic patient. Further imaging with MRI may be helpful. Electronically Signed   By: Zetta Bills M.D.   On: 04/15/2019 10:07    Positive ROS: All other systems have been reviewed and were otherwise negative with the exception of those mentioned in the HPI and as above.  Physical Exam: General: Alert, no acute distress Cardiovascular: No pedal edema Respiratory: No cyanosis, no use of accessory musculature GI: No organomegaly, abdomen is soft and non-tender Skin: No lesions in the area of chief complaint Neurologic: Sensation intact distally Psychiatric: Patient is competent for consent with normal mood and affect Lymphatic: No axillary or cervical lymphadenopathy  MUSCULOSKELETAL: Examination of the left hip reveals no skin wounds or lesions.  No rotational deformity or leg length discrepancy.  He  has pain with logrolling of the hip.  He has neurovascularly intact distally.  Assessment: Left intertrochanteric femur fracture. Multiple medical issues.  Plan: I discussed the findings with the patient.  His particular fracture is high risk for displacement.  Therefore, I recommend surgical stabilization with intramedullary nail.  We discussed the risk, benefits, and alternatives.  The patient will be evaluated by cardiology preoperatively.  Plan for surgery tomorrow.  N.p.o. after midnight.  Hold chemical DVT prophylaxis.  Bedrest for now.  All questions solicited and answered.  The risks, benefits, and alternatives were discussed with the patient. There are risks associated with the surgery including, but not limited to, problems with anesthesia (death), infection, differences in leg length/angulation/rotation, fracture of bones, loosening or failure of implants, malunion, nonunion, hematoma (blood accumulation) which may require surgical drainage, blood clots, pulmonary embolism, nerve injury (foot drop), and blood vessel injury. The patient understands these risks and elects to proceed.   Bertram Savin, MD Cell [phone]    04/15/2019 7:43 PM

## 2019-04-15 NOTE — ED Triage Notes (Signed)
Pt presents with c/o left hip pain after a fall that occurred last night. Pt's family at bedside reports that he has been able to put some weight on that hip since the fall but has had pain with ambulation.

## 2019-04-15 NOTE — H&P (View-Only) (Signed)
ORTHOPAEDIC CONSULTATION  REQUESTING PHYSICIAN: Georgette Shell, MD  PCP:  [name], MD  Chief Complaint: Left hip injury  HPI: Jesse [name] is a 85 y.o. male with a history of coronary artery disease, hypertension, MI, right bundle branch block, multiple arterial aneurysms, prostate cancer who complains of left hip pain and inability to weight-bear after he tripped and fell at home yesterday.  He fell on his hip.  He has hip pain and difficulty weightbearing.  He came to the emergency department at [facility], where x-rays and MRI of the left hip revealed a nondisplaced left intertrochanteric femur fracture.  He was admitted by the hospitalist.  Orthopedic consultation was placed for management of his left hip fracture.  The patient lives at home alone.  He is ambulatory.  He sees Dr. Donnetta Hutching for his vascular issues.  Past Medical History:  Diagnosis Date   AAA (abdominal aortic aneurysm) (HCC)    Arthritis    Bronchitis    hx of appro 40 years ago   Cancer ([place]) 2010   prostate ( 40 Txs. of radiation treatments)   Coronary artery disease    Environmental allergies    GERD (gastroesophageal reflux disease)    HOH (hard of hearing)    wears bilateral hearing aids   Hyperlipidemia    Hypertension    Kidney stone 1968   Myocardial infarction ([place]) 1992   Pneumonia    hx of   RBBB    Past Surgical History:  Procedure Laterality Date   ABDOMINAL AORTIC ANEURYSM REPAIR  08-13-1993   Dr Cameron Sprang   CARDIAC CATHETERIZATION     COLONOSCOPY W/ POLYPECTOMY     CORONARY ARTERY BYPASS GRAFT  03-01-1991   Dr. Servando Snare   ESOPHAGOGASTRODUODENOSCOPY (EGD) WITH PROPOFOL N/A 05/04/2018   Procedure: ESOPHAGOGASTRODUODENOSCOPY (EGD) WITH PROPOFOL;  Surgeon: [name], MD;  Location: [facility] ENDOSCOPY;  Service: Gastroenterology;  Laterality: N/A;   EYE SURGERY     Detached retina (Left eye); bilateral cataract removal   FALSE ANEURYSM REPAIR   05/07/2012   Procedure: REPAIR FALSE ANEURYSM;  Surgeon: Rosetta Posner, MD;  Location: [facility] OR;  Service: Vascular;  Laterality: Left;  Repair of Left Femoral Artery Aneurysm   HERNIA REPAIR     HOT HEMOSTASIS N/A 05/04/2018   Procedure: HOT HEMOSTASIS (ARGON PLASMA COAGULATION/BICAP);  Surgeon: [name], MD;  Location: [facility];  Service: Gastroenterology;  Laterality: N/A;   ORIF PATELLA Right 06/09/2015   Procedure: OPEN REDUCTION INTERNAL (ORIF) FIXATION PATELLA;  Surgeon: Marybelle Killings, MD;  Location: [facility];  Service: Orthopedics;  Laterality: Right;   repair of bowel obstruction  1999   Dr. Dalbert Batman   repair of ventral hernia  04-20-1994   P. Cameron Sprang MD   resection femoral artery  03/19/2012   Dr. [name] Early   [place] INJECTION  05/04/2018   Procedure: SUBMUCOSAL INJECTION;  Surgeon: [name], MD;  Location: [facility];  Service: Gastroenterology;;   Social History   Socioeconomic History   Marital status: Widowed    Spouse name: Not on file   Number of children: Not on file   Years of education: Not on file   Highest education level: Not on file  Occupational History   Not on file  Social Needs   Financial resource strain: Not on file   Food insecurity    Worry: Not on file    Inability: Not on file   Transportation needs  Medical: Not on file    Non-medical: Not on file  Tobacco Use   Smoking status: Former Smoker    Years: 20.00    Types: Cigarettes    Quit date: 07/25/1968    Years since quitting: 50.7   Smokeless tobacco: Never Used  Substance and Sexual Activity   Alcohol use: No   Drug use: No   Sexual activity: Not on file  Lifestyle   Physical activity    Days per week: Not on file    Minutes per session: Not on file   Stress: Not on file  Relationships   Social connections    Talks on phone: Not on file    Gets together: Not on file    Attends religious service: Not on file    Active member of club or  organization: Not on file    Attends meetings of clubs or organizations: Not on file    Relationship status: Not on file  Other Topics Concern   Not on file  Social History Narrative   Not on file   Family History  Problem Relation Age of Onset   Cancer Mother    Cancer Father    Varicose Veins Father    Cancer Sister    Diabetes Sister    Heart attack Sister    Allergies  Allergen Reactions   Quinolones    Adhesive [Tape] Itching and Rash   Lactose Intolerance (Gi) Other (See Comments)    Gas, bloating, stomach indigestion    Prior to Admission medications   Medication Sig Start Date End Date Taking? Authorizing Provider  acetaminophen (TYLENOL) 325 MG tablet Take 650 mg by mouth every 6 (six) hours as needed for mild pain.   Yes [provider]  acetaminophen (TYLENOL) 500 MG tablet Take 500 mg by mouth every 6 (six) hours as needed for mild pain or headache.   Yes [provider]  aspirin 81 MG tablet Take 81 mg by mouth daily.   Yes [provider]  carboxymethylcellulose (REFRESH PLUS) 0.5 % SOLN Apply 1-2 drops to eye at bedtime.    Yes [provider]  carvedilol (COREG) 12.5 MG tablet TAKE 1 TABLET BY MOUTH 2 TIMES DAILY WITH A MEAL Patient taking differently: Take 12.5 mg by mouth 2 (two) times daily with a meal.  06/27/18  Yes [name], MD  Cyanocobalamin (B-12 PO) Take 1 tablet by mouth daily.   Yes [provider]  fenofibrate 160 MG tablet TAKE 1 TABLET BY MOUTH DAILY Patient taking differently: Take 160 mg by mouth daily.  06/27/18  Yes [name], MD  hydrochlorothiazide (HYDRODIURIL) 12.5 MG tablet Take 1 tablet (12.5 mg total) by mouth daily. 06/27/18  Yes [name], MD  hydrocortisone 2.5 % cream Apply 1 application topically as needed (for dry ears).  08/16/18  Yes [provider]  loratadine (CLARITIN) 10 MG tablet Take 10 mg by mouth daily.    Yes [provider]    losartan (COZAAR) 50 MG tablet TAKE ONE TABLET BY MOUTH DAILY. Vandiver COZAAR Patient taking differently: Take 50 mg by mouth daily.  03/13/19  Yes [name], MD  pantoprazole (PROTONIX) 40 MG tablet Take 1 tablet (40 mg total) by mouth 2 (two) times daily. Patient taking differently: Take 40 mg by mouth daily.  05/08/18  Yes Charlynne Cousins, MD  simvastatin (ZOCOR) 40 MG tablet Take 1 tablet (40 mg total) by mouth daily at 6 PM. 05/21/18  Cristi Loron, MD   Dg Chest 1 View  Result Date: 04/15/2019 CLINICAL DATA:  Preop.  Hypertension. EXAM: CHEST  1 VIEW COMPARISON:  01/01/2016 FINDINGS: Prior CABG. Mild cardiomegaly. No confluent opacities or effusions. No acute bony abnormality. IMPRESSION: No active disease. Electronically Signed   By: Rolm Baptise M.D.   On: 04/15/2019 18:14   Dg Knee 1-2 Views Left  Result Date: 04/15/2019 CLINICAL DATA:  Hip fracture, left leg pain EXAM: LEFT KNEE - 1-2 VIEW COMPARISON:  Lower extremity CT angiogram 12/31/2012 FINDINGS: No acute fracture. No malalignment. Joint spaces are relatively preserved. No joint effusion. Extensive vascular calcifications with focal enlargement of the calcification in the region of the popliteal artery measuring 3.6 cm in diameter suggesting a popliteal artery aneurysm. Comparison to CT angiogram 12/31/2012 demonstrated a 3.0 cm aneurysm at this site. IMPRESSION: 1. No acute osseous abnormality, left knee. 2. Extensive atherosclerosis with an enlarging popliteal artery aneurysm measuring approximately 3.6 cm in diameter. Electronically Signed   By: Davina Poke M.D.   On: 04/15/2019 19:12   Dg Knee 2 Views Right  Result Date: 04/15/2019 CLINICAL DATA:  Preop EXAM: RIGHT KNEE - 1-2 VIEW COMPARISON:  06/01/2015 FINDINGS: Screws and cerclage wires noted within the patella. For mild degenerative changes in the right knee, best seen in the patellofemoral compartment with joint space narrowing and  spurring. No fracture, subluxation or dislocation. Atherosclerotic calcifications within the femoral and popliteal arteries. There appears to be a popliteal artery aneurysm measuring approximately 3 cm above the right knee (there may be magnification, over estimating the size). IMPRESSION: Postoperative changes in the patella. No acute bony abnormality. Early degenerative changes. Vascular calcifications. Concern for popliteal artery aneurysm. This could be further evaluated with ultrasound or CT if felt clinically indicated. Electronically Signed   By: Rolm Baptise M.D.   On: 04/15/2019 18:24   Mr Hip Left Wo Contrast  Result Date: 04/15/2019 CLINICAL DATA:  Left hip pain secondary to a fall last night. EXAM: MR OF THE LEFT HIP WITHOUT CONTRAST TECHNIQUE: Multiplanar, multisequence MR imaging was performed. No intravenous contrast was administered. COMPARISON:  Radiographs dated 04/15/2019 and CT scan of the abdomen and pelvis dated 05/04/2018 FINDINGS: Bones: There is an incomplete nondisplaced intertrochanteric fracture of the proximal left femur extending from the tip of the left greater trochanter approximately 60% of the way through the intertrochanteric region. The other bones of the pelvis are intact. Articular cartilage and labrum Articular cartilage: Slight thinning of the articular cartilage of the posterior aspect of the joint. Labrum:  Intact. Joint or bursal effusion Joint effusion:  No joint effusion. Bursae: No bursitis. Muscles and tendons Muscles and tendons: There is a 5 x 5 x 3 cm intramuscular hematoma in the left gluteus maximus muscle posterior to the left greater trochanter. There is extensive edema in the gluteus maximus muscle. There is also edema in the left quadratus femoris muscle as well as slight edema along the left iliopsoas tendon. Other findings Miscellaneous: The patient has chronic massive internal iliac artery aneurysms, 8.6 cm on the right and 7.3 cm on the left. There  appears to be blood flow in portions of each of these large aneurysms. These were demonstrated on the prior CT scan of the abdomen and pelvis dated 05/04/2018. IMPRESSION: 1. Incomplete nondisplaced intertrochanteric fracture of the proximal left femur. 2. Strains of the left gluteus maximus, left quadratus femoris, and left iliopsoas muscles. Intramuscular hematoma of the left gluteus maximus muscle 3. Chronic massive  internal iliac artery aneurysms. Electronically Signed   By: Lorriane Shire M.D.   On: 04/15/2019 15:57   Dg Hip Unilat With Pelvis 2-3 Views Left  Result Date: 04/15/2019 CLINICAL DATA:  Fall with left-sided hip pain. EXAM: DG HIP (WITH OR WITHOUT PELVIS) 2-3V LEFT COMPARISON:  None. FINDINGS: Subtle irregularity of the lesser trochanter is noted on dedicated views of the left hip. Right hip is located. Patient is osteopenic with spinal degenerative changes and signs of vascular disease with postoperative changes and vascular calcifications overlying the hip and pelvis in various locations. The left hip is located. IMPRESSION: Question subtle left intertrochanteric fracture in this osteopenic patient. Further imaging with MRI may be helpful. Electronically Signed   By: Zetta Bills M.D.   On: 04/15/2019 10:07    Positive ROS: All other systems have been reviewed and were otherwise negative with the exception of those mentioned in the HPI and as above.  Physical Exam: General: Alert, no acute distress Cardiovascular: No pedal edema Respiratory: No cyanosis, no use of accessory musculature GI: No organomegaly, abdomen is soft and non-tender Skin: No lesions in the area of chief complaint Neurologic: Sensation intact distally Psychiatric: Patient is competent for consent with normal mood and affect Lymphatic: No axillary or cervical lymphadenopathy  MUSCULOSKELETAL: Examination of the left hip reveals no skin wounds or lesions.  No rotational deformity or leg length discrepancy.  He  has pain with logrolling of the hip.  He has neurovascularly intact distally.  Assessment: Left intertrochanteric femur fracture. Multiple medical issues.  Plan: I discussed the findings with the patient.  His particular fracture is high risk for displacement.  Therefore, I recommend surgical stabilization with intramedullary nail.  We discussed the risk, benefits, and alternatives.  The patient will be evaluated by cardiology preoperatively.  Plan for surgery tomorrow.  N.p.o. after midnight.  Hold chemical DVT prophylaxis.  Bedrest for now.  All questions solicited and answered.  The risks, benefits, and alternatives were discussed with the patient. There are risks associated with the surgery including, but not limited to, problems with anesthesia (death), infection, differences in leg length/angulation/rotation, fracture of bones, loosening or failure of implants, malunion, nonunion, hematoma (blood accumulation) which may require surgical drainage, blood clots, pulmonary embolism, nerve injury (foot drop), and blood vessel injury. The patient understands these risks and elects to proceed.   Bertram Savin, MD Cell [phone]    04/15/2019 7:43 PM

## 2019-04-16 ENCOUNTER — Encounter (HOSPITAL_COMMUNITY)
Admission: EM | Disposition: A | Discharge status: Skilled Nursing Facility | Payer: Self-pay | Source: Home / Self Care | Attending: Internal Medicine

## 2019-04-16 ENCOUNTER — Encounter (HOSPITAL_COMMUNITY): Payer: Self-pay | Admitting: *Deleted

## 2019-04-16 ENCOUNTER — Other Ambulatory Visit (HOSPITAL_COMMUNITY): Payer: Medicare Other

## 2019-04-16 ENCOUNTER — Inpatient Hospital Stay (HOSPITAL_COMMUNITY): Payer: Medicare Other

## 2019-04-16 ENCOUNTER — Inpatient Hospital Stay (HOSPITAL_COMMUNITY): Payer: Medicare Other | Admitting: Anesthesiology

## 2019-04-16 DIAGNOSIS — W19XXXD Unspecified fall, subsequent encounter: Secondary | ICD-10-CM

## 2019-04-16 DIAGNOSIS — Z01810 Encounter for preprocedural cardiovascular examination: Secondary | ICD-10-CM

## 2019-04-16 DIAGNOSIS — I2510 Atherosclerotic heart disease of native coronary artery without angina pectoris: Secondary | ICD-10-CM

## 2019-04-16 DIAGNOSIS — I5042 Chronic combined systolic (congestive) and diastolic (congestive) heart failure: Secondary | ICD-10-CM

## 2019-04-16 HISTORY — PX: INTRAMEDULLARY (IM) NAIL INTERTROCHANTERIC: SHX5875

## 2019-04-16 LAB — CBC
HCT: 31.4 % — ABNORMAL LOW (ref 39.0–52.0)
Hemoglobin: 9.9 g/dL — ABNORMAL LOW (ref 13.0–17.0)
MCH: 29.7 pg (ref 26.0–34.0)
MCHC: 31.5 g/dL (ref 30.0–36.0)
MCV: 94.3 fL (ref 80.0–100.0)
Platelets: 157 10*3/uL (ref 150–400)
RBC: 3.33 MIL/uL — ABNORMAL LOW (ref 4.22–5.81)
RDW: 13.6 % (ref 11.5–15.5)
WBC: 6.4 10*3/uL (ref 4.0–10.5)
nRBC: 0 % (ref 0.0–0.2)

## 2019-04-16 LAB — COMPREHENSIVE METABOLIC PANEL
ALT: 15 U/L (ref 0–44)
AST: 17 U/L (ref 15–41)
Albumin: 3.1 g/dL — ABNORMAL LOW (ref 3.5–5.0)
Alkaline Phosphatase: 35 U/L — ABNORMAL LOW (ref 38–126)
Anion gap: 8 (ref 5–15)
BUN: 26 mg/dL — ABNORMAL HIGH (ref 8–23)
CO2: 18 mmol/L — ABNORMAL LOW (ref 22–32)
Calcium: 8.5 mg/dL — ABNORMAL LOW (ref 8.9–10.3)
Chloride: 110 mmol/L (ref 98–111)
Creatinine, Ser: 0.95 mg/dL (ref 0.61–1.24)
GFR calc Af Amer: >60 mL/min (ref >60)
GFR calc non Af Amer: >60 mL/min (ref >60)
Glucose, Bld: 93 mg/dL (ref 70–99)
Potassium: 3.8 mmol/L (ref 3.5–5.1)
Sodium: 136 mmol/L (ref 135–145)
Total Bilirubin: 1.7 mg/dL — ABNORMAL HIGH (ref 0.3–1.2)
Total Protein: 5.2 g/dL — ABNORMAL LOW (ref 6.5–8.1)

## 2019-04-16 LAB — TYPE AND SCREEN
ABO/RH(D): A POS
Antibody Screen: NEGATIVE

## 2019-04-16 LAB — PROTIME-INR
INR: 1.4 — ABNORMAL HIGH (ref 0.8–1.2)
Prothrombin Time: 16.6 seconds — ABNORMAL HIGH (ref 11.4–15.2)

## 2019-04-16 SURGERY — FIXATION, FRACTURE, INTERTROCHANTERIC, WITH INTRAMEDULLARY ROD
Anesthesia: General | Site: Leg Upper | Laterality: Left

## 2019-04-16 MED ORDER — CEFAZOLIN SODIUM-DEXTROSE 2-4 GM/100ML-% IV SOLN
2.0000 g | INTRAVENOUS | Status: AC
  Administered 2019-04-16: 13:00:00 2 g via INTRAVENOUS
  Filled 2019-04-16: qty 100

## 2019-04-16 MED ORDER — METOCLOPRAMIDE HCL 5 MG PO TABS: 5.0000 mg | ORAL_TABLET | Freq: Three times a day (TID) | ORAL | Status: DC | PRN

## 2019-04-16 MED ORDER — CARBOXYMETHYLCELLULOSE SODIUM 0.5 % OP SOLN: 1.0000 [drp] | Freq: Every day | OPHTHALMIC | Status: DC

## 2019-04-16 MED ORDER — OXYCODONE HCL 5 MG/5ML PO SOLN: 5.0000 mg | Freq: Once | ORAL | Status: DC | PRN

## 2019-04-16 MED ORDER — ONDANSETRON HCL 4 MG/2ML IJ SOLN
INTRAMUSCULAR | Status: AC
  Filled 2019-04-16: qty 2

## 2019-04-16 MED ORDER — HYDROMORPHONE HCL 1 MG/ML IJ SOLN
INTRAMUSCULAR | Status: AC
  Filled 2019-04-16: qty 1

## 2019-04-16 MED ORDER — POVIDONE-IODINE 10 % EX SWAB
2.0000 "application " | Freq: Once | CUTANEOUS | Status: AC
  Administered 2019-04-16: 2 via TOPICAL

## 2019-04-16 MED ORDER — ONDANSETRON HCL 4 MG/2ML IJ SOLN
INTRAMUSCULAR | Status: DC | PRN
  Administered 2019-04-16: 4 mg via INTRAVENOUS

## 2019-04-16 MED ORDER — ONDANSETRON HCL 4 MG PO TABS: 4.0000 mg | ORAL_TABLET | Freq: Four times a day (QID) | ORAL | Status: DC | PRN

## 2019-04-16 MED ORDER — ISOPROPYL ALCOHOL 70 % SOLN
Status: AC
  Filled 2019-04-16: qty 480

## 2019-04-16 MED ORDER — SODIUM CHLORIDE 0.9 % IR SOLN
Status: DC | PRN
  Administered 2019-04-16: 1000 mL

## 2019-04-16 MED ORDER — ALBUMIN HUMAN 5 % IV SOLN
INTRAVENOUS | Status: DC | PRN
  Administered 2019-04-16: 14:00:00 via INTRAVENOUS

## 2019-04-16 MED ORDER — PROMETHAZINE HCL 25 MG/ML IJ SOLN: 6.2500 mg | INTRAMUSCULAR | Status: DC | PRN

## 2019-04-16 MED ORDER — OXYCODONE HCL 5 MG PO TABS: 5.0000 mg | ORAL_TABLET | Freq: Once | ORAL | Status: DC | PRN

## 2019-04-16 MED ORDER — FENOFIBRATE 160 MG PO TABS
160.0000 mg | ORAL_TABLET | Freq: Every day | ORAL | Status: DC
  Administered 2019-04-17 – 2019-04-20 (×4): 160 mg via ORAL
  Filled 2019-04-16 (×4): qty 1

## 2019-04-16 MED ORDER — SUGAMMADEX SODIUM 200 MG/2ML IV SOLN
INTRAVENOUS | Status: DC | PRN
  Administered 2019-04-16: 150 mg via INTRAVENOUS

## 2019-04-16 MED ORDER — HYDROMORPHONE HCL 1 MG/ML IJ SOLN
0.2500 mg | INTRAMUSCULAR | Status: DC | PRN
  Administered 2019-04-16: 0.5 mg via INTRAVENOUS

## 2019-04-16 MED ORDER — SENNA 8.6 MG PO TABS
1.0000 | ORAL_TABLET | Freq: Two times a day (BID) | ORAL | Status: DC
  Administered 2019-04-17 – 2019-04-19 (×6): 8.6 mg via ORAL
  Filled 2019-04-16 (×8): qty 1

## 2019-04-16 MED ORDER — CEFAZOLIN SODIUM-DEXTROSE 2-4 GM/100ML-% IV SOLN
2.0000 g | Freq: Four times a day (QID) | INTRAVENOUS | Status: AC
  Administered 2019-04-16 – 2019-04-17 (×2): 2 g via INTRAVENOUS
  Filled 2019-04-16 (×2): qty 100

## 2019-04-16 MED ORDER — SODIUM CHLORIDE 0.9 % IV SOLN
INTRAVENOUS | Status: DC | PRN
  Administered 2019-04-16: 10 ug/min via INTRAVENOUS

## 2019-04-16 MED ORDER — HYDRALAZINE HCL 20 MG/ML IJ SOLN
INTRAMUSCULAR | Status: AC
  Filled 2019-04-16: qty 1

## 2019-04-16 MED ORDER — SUCCINYLCHOLINE CHLORIDE 20 MG/ML IJ SOLN
INTRAMUSCULAR | Status: DC | PRN
  Administered 2019-04-16: 100 mg via INTRAVENOUS

## 2019-04-16 MED ORDER — METOCLOPRAMIDE HCL 5 MG/ML IJ SOLN: 5.0000 mg | Freq: Three times a day (TID) | INTRAMUSCULAR | Status: DC | PRN

## 2019-04-16 MED ORDER — FENTANYL CITRATE (PF) 100 MCG/2ML IJ SOLN
INTRAMUSCULAR | Status: AC
  Filled 2019-04-16: qty 2

## 2019-04-16 MED ORDER — PROPOFOL 10 MG/ML IV BOLUS
INTRAVENOUS | Status: AC
  Filled 2019-04-16: qty 40

## 2019-04-16 MED ORDER — FENTANYL CITRATE (PF) 100 MCG/2ML IJ SOLN
INTRAMUSCULAR | Status: DC | PRN
  Administered 2019-04-16 (×2): 25 ug via INTRAVENOUS
  Administered 2019-04-16: 50 ug via INTRAVENOUS

## 2019-04-16 MED ORDER — MENTHOL 3 MG MT LOZG: 1.0000 | LOZENGE | OROMUCOSAL | Status: DC | PRN

## 2019-04-16 MED ORDER — PHENOL 1.4 % MT LIQD: 1.0000 | OROMUCOSAL | Status: DC | PRN

## 2019-04-16 MED ORDER — CHLORHEXIDINE GLUCONATE 4 % EX LIQD
60.0000 mL | Freq: Once | CUTANEOUS | Status: AC
  Administered 2019-04-16: 4 via TOPICAL

## 2019-04-16 MED ORDER — LACTATED RINGERS IV SOLN
INTRAVENOUS | Status: DC
  Administered 2019-04-16: 1000 mL via INTRAVENOUS
  Administered 2019-04-16: 12:00:00 via INTRAVENOUS

## 2019-04-16 MED ORDER — DOCUSATE SODIUM 100 MG PO CAPS
100.0000 mg | ORAL_CAPSULE | Freq: Two times a day (BID) | ORAL | Status: DC
  Administered 2019-04-17 – 2019-04-19 (×6): 100 mg via ORAL
  Filled 2019-04-16 (×6): qty 1

## 2019-04-16 MED ORDER — ONDANSETRON HCL 4 MG/2ML IJ SOLN: 4.0000 mg | Freq: Four times a day (QID) | INTRAMUSCULAR | Status: DC | PRN

## 2019-04-16 MED ORDER — TRANEXAMIC ACID-NACL 1000-0.7 MG/100ML-% IV SOLN
1000.0000 mg | INTRAVENOUS | Status: AC
  Administered 2019-04-16: 1000 mg via INTRAVENOUS
  Filled 2019-04-16: qty 100

## 2019-04-16 MED ORDER — ISOPROPYL ALCOHOL 70 % SOLN
Status: DC | PRN
  Administered 2019-04-16: 1 via TOPICAL

## 2019-04-16 MED ORDER — ENOXAPARIN SODIUM 40 MG/0.4ML ~~LOC~~ SOLN
40.0000 mg | SUBCUTANEOUS | Status: DC
  Administered 2019-04-17 – 2019-04-20 (×4): 40 mg via SUBCUTANEOUS
  Filled 2019-04-16 (×4): qty 0.4

## 2019-04-16 MED ORDER — ROCURONIUM BROMIDE 100 MG/10ML IV SOLN
INTRAVENOUS | Status: DC | PRN
  Administered 2019-04-16: 20 mg via INTRAVENOUS
  Administered 2019-04-16: 10 mg via INTRAVENOUS

## 2019-04-16 MED ORDER — ALBUMIN HUMAN 5 % IV SOLN
INTRAVENOUS | Status: AC
  Filled 2019-04-16: qty 250

## 2019-04-16 MED ORDER — LORATADINE 10 MG PO TABS
10.0000 mg | ORAL_TABLET | Freq: Every day | ORAL | Status: DC
  Administered 2019-04-17 – 2019-04-20 (×4): 10 mg via ORAL
  Filled 2019-04-16 (×4): qty 1

## 2019-04-16 MED ORDER — POLYVINYL ALCOHOL 1.4 % OP SOLN
1.0000 [drp] | Freq: Every day | OPHTHALMIC | Status: DC
  Administered 2019-04-18: 1 [drp] via OPHTHALMIC
  Administered 2019-04-19: 2 [drp] via OPHTHALMIC
  Filled 2019-04-16: qty 15

## 2019-04-16 MED ORDER — LIDOCAINE HCL (CARDIAC) PF 100 MG/5ML IV SOSY
PREFILLED_SYRINGE | INTRAVENOUS | Status: DC | PRN
  Administered 2019-04-16: 50 mg via INTRAVENOUS

## 2019-04-16 MED ORDER — ENSURE PRE-SURGERY PO LIQD
296.0000 mL | Freq: Once | ORAL | Status: AC
  Administered 2019-04-16: 296 mL via ORAL
  Filled 2019-04-16: qty 296

## 2019-04-16 MED ORDER — PROPOFOL 10 MG/ML IV BOLUS
INTRAVENOUS | Status: DC | PRN
  Administered 2019-04-16: 80 mg via INTRAVENOUS
  Administered 2019-04-16: 20 mg via INTRAVENOUS

## 2019-04-16 MED ORDER — MIDAZOLAM HCL 2 MG/2ML IJ SOLN
INTRAMUSCULAR | Status: AC
  Filled 2019-04-16: qty 2

## 2019-04-16 SURGICAL SUPPLY — 42 items
BAG ZIPLOCK 12X15 (MISCELLANEOUS) IMPLANT
BIT DRILL AO GAMMA 4.2X340 (BIT) ×1 IMPLANT
CHLORAPREP W/TINT 26 (MISCELLANEOUS) ×2 IMPLANT
COVER PERINEAL POST (MISCELLANEOUS) ×2 IMPLANT
COVER SURGICAL LIGHT HANDLE (MISCELLANEOUS) ×2 IMPLANT
COVER WAND RF STERILE (DRAPES) IMPLANT
DERMABOND ADVANCED (GAUZE/BANDAGES/DRESSINGS) ×1
DERMABOND ADVANCED .7 DNX12 (GAUZE/BANDAGES/DRESSINGS) ×2 IMPLANT
DRAPE C-ARM 42X120 X-RAY (DRAPES) ×2 IMPLANT
DRAPE C-ARMOR (DRAPES) ×2 IMPLANT
DRAPE IMP U-DRAPE 54X76 (DRAPES) ×4 IMPLANT
DRAPE SHEET LG 3/4 BI-LAMINATE (DRAPES) ×4 IMPLANT
DRAPE STERI IOBAN 125X83 (DRAPES) ×2 IMPLANT
DRAPE U-SHAPE 47X51 STRL (DRAPES) ×4 IMPLANT
DRSG MEPILEX BORDER 4X4 (GAUZE/BANDAGES/DRESSINGS) ×3 IMPLANT
DRSG MEPILEX BORDER 4X8 (GAUZE/BANDAGES/DRESSINGS) ×1 IMPLANT
ELECT BLADE TIP CTD 4 INCH (ELECTRODE) IMPLANT
FACESHIELD WRAPAROUND (MASK) ×6 IMPLANT
FACESHIELD WRAPAROUND OR TEAM (MASK) ×2 IMPLANT
GAUZE SPONGE 4X4 12PLY STRL (GAUZE/BANDAGES/DRESSINGS) ×2 IMPLANT
GLOVE BIO SURGEON STRL SZ8.5 (GLOVE) ×4 IMPLANT
GLOVE BIOGEL PI IND STRL 8.5 (GLOVE) ×1 IMPLANT
GLOVE BIOGEL PI INDICATOR 8.5 (GLOVE) ×1
GOWN SPEC L3 XXLG W/TWL (GOWN DISPOSABLE) ×2 IMPLANT
GUIDEROD T2 3X1000 (ROD) ×1 IMPLANT
K-WIRE  3.2X450M STR (WIRE) ×1
K-WIRE 3.2X450M STR (WIRE) ×1
KIT BASIN OR (CUSTOM PROCEDURE TRAY) ×2 IMPLANT
KIT TURNOVER KIT A (KITS) IMPLANT
KWIRE 3.2X450M STR (WIRE) IMPLANT
MANIFOLD NEPTUNE II (INSTRUMENTS) ×2 IMPLANT
MARKER SKIN DUAL TIP RULER LAB (MISCELLANEOUS) ×2 IMPLANT
NAIL TROCH GAMMA 3 KT (Nail) ×1 IMPLANT
PACK GENERAL/GYN (CUSTOM PROCEDURE TRAY) ×2 IMPLANT
REAMER SHAFT BIXCUT (INSTRUMENTS) ×1 IMPLANT
SCREW LAG GAMMA 3 110MM (Screw) ×1 IMPLANT
SCREW LOCKING T2 F/T  5X37.5MM (Screw) ×1 IMPLANT
SCREW LOCKING T2 F/T 5X37.5MM (Screw) IMPLANT
SUT MNCRL AB 3-0 PS2 18 (SUTURE) ×2 IMPLANT
SUT MON AB 2-0 CT1 36 (SUTURE) ×1 IMPLANT
SUT VIC AB 1 CT1 36 (SUTURE) ×2 IMPLANT
TOWEL OR 17X26 10 PK STRL BLUE (TOWEL DISPOSABLE) ×2 IMPLANT

## 2019-04-16 NOTE — Consult Note (Addendum)
Cardiology Consultation:   Patient ID: Jesse [name] MRN: [id number]; DOB: 12-14-1927  Admit date: 04/15/2019 Date of Consult: 04/16/2019  Primary Care Provider: [name], MD Primary Cardiologist: [name], MD  Primary Electrophysiologist:  None    Patient Profile:   Jesse [name] is a 85 y.o. male with a hx of CAD s/p CABG in 1992, chronic combined systolic and diastolic HF, diffuse aneurysmal disease (thoracic, iliac and popliteal), HTN and RBBB who is being seen today for perioperative evaluation prior to anticipated hip surgery to treat left intertrochanteric fracture at the request of Dr. Zigmund Daniel w/ Internal Medicine and Dr. Lyla Glassing, Orthopedic Surgery.   History of Present Illness:   Jesse [name] is a 85 y.o. male with coronary artery disease s/p CABG in 1992, chronic combined systolic and diastolic HF (EF [id number] w/ G2DD by echo in 2019), diffuse aneurysmal disease (followed by Dr. Donnetta Hutching and Dr. Servando Snare - thoracic aortic aneurysm (4.1 cm ascending thoracic aortic and 4.5 cm proximal descending thoracic aortic aneurysms by CT in 2018), iliac aneurysms, popliteal artery aneurysms), prior upper GI bleed secondary to duodenal ulcer requiring transfusions with PRBCs, left leg venous insufficiency (vein harvesting site), HTN, RBBB and h/o prostate cancer.  He presented to the [facility] ED on 04/15/19 after a mechanical fall at home. No LOC. Presented w/ complaint of left hip pain. MRI of the hip shows incomplete nondisplaced intertrochanteric fracture of the proximal left femur intramuscular hematoma of the left gluteus maximus muscle.  He was seen by orthopedics and it is felt that his particular fracture is high risk for displacement. Surgery recommended. Cardiology consulted for preoperative evaluation and risk assessment.   His last echo was 05/2018 which showed mild-moderate LVEF 40-45% w/ G1DD. No significant valvular disease. Primary team has ordered a repeat echo prior to surgery.  This is pending. His EKG is difficult to interpret. Computer reads as atrial flutter but appears to be sinus w/ RBBB. HR 72 bpm. Currently not on tele.   He denies any recent anginal symptoms. Fairly active for 89. Walks the loop at [employer], roughly 1-1.5 miles, weekly, w/o any exertional CP. He reports mild dyspnea walking up hills/inclines but no exertional dyspnea walking on flat surfaces. He denies resting dyspnea, orthopnea/ PND and LEE. No syncope or near syncope. He lives home alone. Wife passed away 10 years ago. He has a son that lives in [place] and daughter in [place].    Heart Pathway Score:     Past Medical History:  Diagnosis Date   AAA (abdominal aortic aneurysm) (HCC)    Arthritis    Bronchitis    hx of appro 40 years ago   Cancer ([place]) 2010   prostate ( 40 Txs. of radiation treatments)   Coronary artery disease    Environmental allergies    GERD (gastroesophageal reflux disease)    HOH (hard of hearing)    wears bilateral hearing aids   Hyperlipidemia    Hypertension    Kidney stone 1968   Myocardial infarction ([place]) 1992   Pneumonia    hx of   RBBB     Past Surgical History:  Procedure Laterality Date   ABDOMINAL AORTIC ANEURYSM REPAIR  08-13-1993   Dr Cameron Sprang   CARDIAC CATHETERIZATION     COLONOSCOPY W/ POLYPECTOMY     CORONARY ARTERY BYPASS GRAFT  03-01-1991   Dr. Servando Snare   ESOPHAGOGASTRODUODENOSCOPY (EGD) WITH PROPOFOL N/A 05/04/2018   Procedure: ESOPHAGOGASTRODUODENOSCOPY (EGD) WITH PROPOFOL;  Surgeon: [name],  MD;  Location: [facility] ENDOSCOPY;  Service: Gastroenterology;  Laterality: N/A;   EYE SURGERY     Detached retina (Left eye); bilateral cataract removal   FALSE ANEURYSM REPAIR  05/07/2012   Procedure: REPAIR FALSE ANEURYSM;  Surgeon: Rosetta Posner, MD;  Location: [facility] OR;  Service: Vascular;  Laterality: Left;  Repair of Left Femoral Artery Aneurysm   HERNIA REPAIR     HOT HEMOSTASIS N/A 05/04/2018    Procedure: HOT HEMOSTASIS (ARGON PLASMA COAGULATION/BICAP);  Surgeon: [name], MD;  Location: [facility];  Service: Gastroenterology;  Laterality: N/A;   ORIF PATELLA Right 06/09/2015   Procedure: OPEN REDUCTION INTERNAL (ORIF) FIXATION PATELLA;  Surgeon: Marybelle Killings, MD;  Location: [place];  Service: Orthopedics;  Laterality: Right;   repair of bowel obstruction  1999   Dr. Dalbert Batman   repair of ventral hernia  04-20-1994   P. Cameron Sprang MD   resection femoral artery  03/19/2012   Dr. [name] Early   [place] INJECTION  05/04/2018   Procedure: SUBMUCOSAL INJECTION;  Surgeon: [name], MD;  Location: [facility];  Service: Gastroenterology;;     Home Medications:  Prior to Admission medications   Medication Sig Start Date End Date Taking? Authorizing Provider  acetaminophen (TYLENOL) 325 MG tablet Take 650 mg by mouth every 6 (six) hours as needed for mild pain.   Yes [provider]  acetaminophen (TYLENOL) 500 MG tablet Take 500 mg by mouth every 6 (six) hours as needed for mild pain or headache.   Yes [provider]  aspirin 81 MG tablet Take 81 mg by mouth daily.   Yes [provider]  carboxymethylcellulose (REFRESH PLUS) 0.5 % SOLN Apply 1-2 drops to eye at bedtime.    Yes [provider]  carvedilol (COREG) 12.5 MG tablet TAKE 1 TABLET BY MOUTH 2 TIMES DAILY WITH A MEAL Patient taking differently: Take 12.5 mg by mouth 2 (two) times daily with a meal.  06/27/18  Yes [name], MD  Cyanocobalamin (B-12 PO) Take 1 tablet by mouth daily.   Yes [provider]  fenofibrate 160 MG tablet TAKE 1 TABLET BY MOUTH DAILY Patient taking differently: Take 160 mg by mouth daily.  06/27/18  Yes [name], MD  hydrochlorothiazide (HYDRODIURIL) 12.5 MG tablet Take 1 tablet (12.5 mg total) by mouth daily. 06/27/18  Yes [name], MD  hydrocortisone 2.5 % cream Apply 1 application topically as needed (for dry ears).  08/16/18   Yes [provider]  loratadine (CLARITIN) 10 MG tablet Take 10 mg by mouth daily.    Yes [provider]  losartan (COZAAR) 50 MG tablet TAKE ONE TABLET BY MOUTH DAILY. [place] COZAAR Patient taking differently: Take 50 mg by mouth daily.  03/13/19  Yes [name], MD  pantoprazole (PROTONIX) 40 MG tablet Take 1 tablet (40 mg total) by mouth 2 (two) times daily. Patient taking differently: Take 40 mg by mouth daily.  05/08/18  Yes Charlynne Cousins, MD  simvastatin (ZOCOR) 40 MG tablet Take 1 tablet (40 mg total) by mouth daily at 6 PM. 05/21/18  Yes [name], MD    Inpatient Medications: Scheduled Meds:  carvedilol  12.5 mg Oral BID WC   Continuous Infusions:  sodium chloride 100 mL/hr at 04/16/19 0845    ceFAZolin (ANCEF) IV     tranexamic acid     PRN Meds: morphine injection  Allergies:    Allergies  Allergen Reactions   Quinolones  Adhesive [Tape] Itching and Rash   Lactose Intolerance (Gi) Other (See Comments)    Gas, bloating, stomach indigestion     Social History:   Social History   Socioeconomic History   Marital status: Widowed    Spouse name: Not on file   Number of children: Not on file   Years of education: Not on file   Highest education level: Not on file  Occupational History   Not on file  Social Needs   Financial resource strain: Not on file   Food insecurity    Worry: Not on file    Inability: Not on file   Transportation needs    Medical: Not on file    Non-medical: Not on file  Tobacco Use   Smoking status: Former Smoker    Years: 20.00    Types: Cigarettes    Quit date: 07/25/1968    Years since quitting: 50.7   Smokeless tobacco: Never Used  Substance and Sexual Activity   Alcohol use: No   Drug use: No   Sexual activity: Not on file  Lifestyle   Physical activity    Days per week: Not on file    Minutes per session: Not on file   Stress: Not on file    Relationships   Social connections    Talks on phone: Not on file    Gets together: Not on file    Attends religious service: Not on file    Active member of club or organization: Not on file    Attends meetings of clubs or organizations: Not on file    Relationship status: Not on file   Intimate partner violence    Fear of current or ex partner: Not on file    Emotionally abused: Not on file    Physically abused: Not on file    Forced sexual activity: Not on file  Other Topics Concern   Not on file  Social History Narrative   Not on file    Family History:    Family History  Problem Relation Age of Onset   Cancer Mother    Cancer Father    Varicose Veins Father    Cancer Sister    Diabetes Sister    Heart attack Sister      ROS:  Please see the history of present illness.   All other ROS reviewed and negative.     Physical Exam/Data:   Vitals:   04/15/19 1930 04/15/19 2030 04/15/19 2126 04/16/19 0610  BP: (!) 142/93 (!) 142/90 133/76 113/72  Pulse: 69 72 67 74  Resp:   16 16  Temp:   98.2 F (36.8 C) 98.3 F (36.8 C)  TempSrc:   Oral Oral  SpO2: 95% 94% 93% 92%  Weight:      Height:        Intake/Output Summary (Last 24 hours) at 04/16/2019 0904 Last data filed at 04/16/2019 0610 Gross per 24 hour  Intake 1141.94 ml  Output 375 ml  Net 766.94 ml   Last 3 Weights 04/15/2019 01/10/2019 11/21/2018  Weight (lbs) 177 lb 178 lb 180 lb  Weight (kg) 80.287 kg 80.74 kg 81.647 kg     Body mass index is 24.01 kg/m.  General:  Well nourished, well developed, in no acute distress HEENT: normal Lymph: no adenopathy Neck: no JVD Endocrine:  No thryomegaly Vascular: No carotid bruits; FA pulses 2+ bilaterally without bruits  Cardiac:  normal S1, S2; RRR; no murmur  Lungs:  clear to auscultation bilaterally, no wheezing, rhonchi or rales  Abd: soft, nontender, no hepatomegaly  Ext: no edema Musculoskeletal:  No deformities, BUE and BLE strength normal  and equal Skin: warm and dry  Neuro:  CNs 2-12 intact, no focal abnormalities noted Psych:  Normal affect   EKG:  The EKG was personally reviewed and demonstrates:  difficult to interpret. Computer reads as atrial flutter but appears to be sinus w/ RBBB.   Telemetry:  Telemetry was personally reviewed and demonstrates:  N/A (non tele unit)  Relevant CV Studies: 2D Echo 05/25/2018 Study Conclusions  - Left ventricle: The cavity size was normal. Wall thickness was   normal. Systolic function was mildly to moderately reduced. The   estimated ejection fraction was in the range of 40% to 45%. Wall   motion was normal; there were no regional wall motion   abnormalities. There was an increased relative contribution of   atrial contraction to ventricular filling. Doppler parameters are   consistent with abnormal left ventricular relaxation (grade 1   diastolic dysfunction). Doppler parameters are consistent with   elevated ventricular end-diastolic filling pressure. - Ventricular septum: Septal motion showed paradox. - Mitral valve: Calcified annulus. - Left atrium: The atrium was moderately dilated.  Repeat 2D Echo 04/16/19 - pending  Laboratory Data:  High Sensitivity Troponin:   Recent Labs  Lab 04/15/19 1838 04/15/19 2131  TROPONINIHS 8 10     Chemistry Recent Labs  Lab 04/15/19 1218 04/16/19 0308  NA 137 136  K 3.7 3.8  CL 107 110  CO2 22 18*  GLUCOSE 112* 93  BUN 30* 26*  CREATININE 1.18 0.95  CALCIUM 9.4 8.5*  GFRNONAA 55* >60  GFRAA >60 >60  ANIONGAP 8 8    Recent Labs  Lab 04/16/19 0308  PROT 5.2*  ALBUMIN 3.1*  AST 17  ALT 15  ALKPHOS 35*  BILITOT 1.7*   Hematology Recent Labs  Lab 04/15/19 1218 04/16/19 0308  WBC 11.1* 6.4  RBC 4.03* 3.33*  HGB 11.9* 9.9*  HCT 37.6* 31.4*  MCV 93.3 94.3  MCH 29.5 29.7  MCHC 31.6 31.5  RDW 13.5 13.6  PLT 192 157   BNPNo results for input(s): BNP, PROBNP in the last 168 hours.  DDimer No results for  input(s): DDIMER in the last 168 hours.   Radiology/Studies:  Dg Chest 1 View  Result Date: 04/15/2019 CLINICAL DATA:  Preop.  Hypertension. EXAM: CHEST  1 VIEW COMPARISON:  01/01/2016 FINDINGS: Prior CABG. Mild cardiomegaly. No confluent opacities or effusions. No acute bony abnormality. IMPRESSION: No active disease. Electronically Signed   By: Rolm Baptise M.D.   On: 04/15/2019 18:14   Dg Knee 1-2 Views Left  Result Date: 04/15/2019 CLINICAL DATA:  Hip fracture, left leg pain EXAM: LEFT KNEE - 1-2 VIEW COMPARISON:  Lower extremity CT angiogram 12/31/2012 FINDINGS: No acute fracture. No malalignment. Joint spaces are relatively preserved. No joint effusion. Extensive vascular calcifications with focal enlargement of the calcification in the region of the popliteal artery measuring 3.6 cm in diameter suggesting a popliteal artery aneurysm. Comparison to CT angiogram 12/31/2012 demonstrated a 3.0 cm aneurysm at this site. IMPRESSION: 1. No acute osseous abnormality, left knee. 2. Extensive atherosclerosis with an enlarging popliteal artery aneurysm measuring approximately 3.6 cm in diameter. Electronically Signed   By: Davina Poke M.D.   On: 04/15/2019 19:12   Dg Knee 2 Views Right  Result Date: 04/15/2019 CLINICAL DATA:  Preop EXAM: RIGHT KNEE - 1-2 VIEW  COMPARISON:  06/01/2015 FINDINGS: Screws and cerclage wires noted within the patella. For mild degenerative changes in the right knee, best seen in the patellofemoral compartment with joint space narrowing and spurring. No fracture, subluxation or dislocation. Atherosclerotic calcifications within the femoral and popliteal arteries. There appears to be a popliteal artery aneurysm measuring approximately 3 cm above the right knee (there may be magnification, over estimating the size). IMPRESSION: Postoperative changes in the patella. No acute bony abnormality. Early degenerative changes. Vascular calcifications. Concern for popliteal artery  aneurysm. This could be further evaluated with ultrasound or CT if felt clinically indicated. Electronically Signed   By: Rolm Baptise M.D.   On: 04/15/2019 18:24   Mr Hip Left Wo Contrast  Result Date: 04/15/2019 CLINICAL DATA:  Left hip pain secondary to a fall last night. EXAM: MR OF THE LEFT HIP WITHOUT CONTRAST TECHNIQUE: Multiplanar, multisequence MR imaging was performed. No intravenous contrast was administered. COMPARISON:  Radiographs dated 04/15/2019 and CT scan of the abdomen and pelvis dated 05/04/2018 FINDINGS: Bones: There is an incomplete nondisplaced intertrochanteric fracture of the proximal left femur extending from the tip of the left greater trochanter approximately 60% of the way through the intertrochanteric region. The other bones of the pelvis are intact. Articular cartilage and labrum Articular cartilage: Slight thinning of the articular cartilage of the posterior aspect of the joint. Labrum:  Intact. Joint or bursal effusion Joint effusion:  No joint effusion. Bursae: No bursitis. Muscles and tendons Muscles and tendons: There is a 5 x 5 x 3 cm intramuscular hematoma in the left gluteus maximus muscle posterior to the left greater trochanter. There is extensive edema in the gluteus maximus muscle. There is also edema in the left quadratus femoris muscle as well as slight edema along the left iliopsoas tendon. Other findings Miscellaneous: The patient has chronic massive internal iliac artery aneurysms, 8.6 cm on the right and 7.3 cm on the left. There appears to be blood flow in portions of each of these large aneurysms. These were demonstrated on the prior CT scan of the abdomen and pelvis dated 05/04/2018. IMPRESSION: 1. Incomplete nondisplaced intertrochanteric fracture of the proximal left femur. 2. Strains of the left gluteus maximus, left quadratus femoris, and left iliopsoas muscles. Intramuscular hematoma of the left gluteus maximus muscle 3. Chronic massive internal iliac  artery aneurysms. Electronically Signed   By: Lorriane Shire M.D.   On: 04/15/2019 15:57   Dg Hip Unilat With Pelvis 2-3 Views Left  Result Date: 04/15/2019 CLINICAL DATA:  Fall with left-sided hip pain. EXAM: DG HIP (WITH OR WITHOUT PELVIS) 2-3V LEFT COMPARISON:  None. FINDINGS: Subtle irregularity of the lesser trochanter is noted on dedicated views of the left hip. Right hip is located. Patient is osteopenic with spinal degenerative changes and signs of vascular disease with postoperative changes and vascular calcifications overlying the hip and pelvis in various locations. The left hip is located. IMPRESSION: Question subtle left intertrochanteric fracture in this osteopenic patient. Further imaging with MRI may be helpful. Electronically Signed   By: Zetta Bills M.D.   On: 04/15/2019 10:07    Assessment and Plan:   Jesse [name] is a 85 y.o. male with a hx of CAD s/p CABG in 1992, chronic combined systolic and diastolic HF, diffuse aneurysmal disease (thoracic, iliac and popliteal), HTN and RBBB who is being seen today for perioperative evaluation prior to anticipated hip surgery to treat left intertrochanteric fracture at the request of Dr. Zigmund Daniel w/ Internal Medicine and  Dr. Lyla Glassing, Orthopedic Surgery.  1. Hip Fracture: 2/2 mechanical fall. Ortho recommends surgical management.   2. CAD: s/p CABG in 1992. NST in 2014 negative for ischemia. He denies any recent anginal symptoms and is able to complete > 4 METS w/o angina.   ASA on hold for surgery  Continue  blocker  Will resume home statin, Simvastatin  3. Chronic Combined Systolic and Diastolic CHF: EF [id number] on prior echo 05/2018  Volume stable on exam. No dyspnea or edema.   Continue  blocker, Coreg, during the perioperative period  Given LV dysfunction, recommend judicious use of IVFs during the perioperative period and close monitoring of volume status   Recommend strict I/Os and daily weights to help monitor volume  status   4. Aneurysmal Disease: thoracic aortic aneurysm (4.1 cm ascending thoracic aortic and 4.5 cm proximal descending thoracic aortic aneurysms by CT in 2018, followed by Dr. Servando Snare). Abdominal aneurysm disease s/p repair and bilateral femoral aneurysm repair, followed by Dr. Donnetta Hutching  Continue  blocker  Keep HR and BP stable  5. Preoperative Cardiovascular Assessment: has known CAD w/ prior CABG in 1992 but no recent anginal symptomatology. He is able to complete > 4METs of physical activity (can walk 1-1.5 miles w/o exertional CP. No exertional dyspnea w/ flat surfaces). EKG shows chronic RBBB. Exam is benign. No murmurs and no carotid bruits. Echocardiogram 05/2018 showed no valvular disease and LVEF was mildly reduced but stable compared to prior studies. No s/s of acute CHF.   Given his age and baseline LV dysfunction, he is at higher risk of complications, however increased morbidity and mortality risk if hip fracture is not treated surgically.   Suspect he can be cleared for surgery with no need for ischemic w/u. However Dr. Acie Fredrickson will need to assess prior to surgery.    Continue  blocker  monitor on tele and monitor volume status closely.   ASA currently on hold. Recommend resuming, as soon as safe to do so, given CAD.   We will follow along closely with you   For questions or updates, please contact [place] Please consult [email or web address] for contact info under     Signed, Lyda Jester, PA-C  04/16/2019 9:04 AM   Attending Note:   The patient was seen and examined.  Agree with assessment and plan as noted above.  Changes made to the above note as needed.  Patient seen and independently examined with Lyda Jester, PA .   We discussed all aspects of the encounter. I agree with the assessment and plan as stated above.  1.   Pre-op evaluation:- pt is very stable.  No angina .  Mild dyspnea waking up hills but has routinely walked in [employer]  without any problems . Volume status is normal.. No CHF.    VS are good   He is at low risk for his upcoming hip surgery.  2.   Chronic combined systolic and diastolic congestive heart failure: He seems to be very stable.  He is not having any any shortness of breath.  There is no edema.  Continue current medications.  Continue to continue carvedilol through the perioperative period.  3.  Hip fracture: Scheduled to be repaired today.    I have spent a total of 40 minutes with patient reviewing hospital  notes , telemetry, EKGs, labs and examining patient as well as establishing an assessment and plan that was discussed with the patient. > 50% of time was spent in direct patient  care.    Jesse [name]., MD, [facility] 04/16/2019, 10:57 AM [address]  [place] Pager [phone]

## 2019-04-16 NOTE — Interval H&P Note (Signed)
History and Physical Interval Note:  04/16/2019 12:38 PM  Allin W Kopecky  has presented today for surgery, with the diagnosis of LEFT INTERTROCHANTERIC FEMORAL FRACTURE.  The various methods of treatment have been discussed with the patient and family. After consideration of risks, benefits and other options for treatment, the patient has consented to  Procedure(s): INTRAMEDULLARY (IM) NAIL INTERTROCHANTRIC (Left) as a surgical intervention.  The patient's history has been reviewed, patient examined, no change in status, stable for surgery.  I have reviewed the patient's chart and labs.  Questions were answered to the patient's satisfaction.     Hilton Cork Franklyn Cafaro

## 2019-04-16 NOTE — Progress Notes (Signed)
Attempted echo. In surgery

## 2019-04-16 NOTE — Discharge Instructions (Signed)
 Dr. Elysia Grand Adult Hip & Knee Specialist [facility] [address] [place], [place] [address] [phone]   POSTOPERATIVE DIRECTIONS    Hip Rehabilitation, Guidelines Following Surgery   WEIGHT BEARING Weight bearing as tolerated with assist device (walker, cane, etc) as directed, use it as long as suggested by your surgeon or therapist, typically at least 4-6 weeks.   HOME CARE INSTRUCTIONS  Remove items at home which could result in a fall. This includes throw rugs or furniture in walking pathways.  Continue medications as instructed at time of discharge.  You may have some home medications which will be placed on hold until you complete the course of blood thinner medication.  4 days after discharge, you may start showering. No tub baths or soaking your incisions. Do not put on socks or shoes without following the instructions of your caregivers.   Sit on chairs with arms. Use the chair arms to help push yourself up when arising.  Arrange for the use of a toilet seat elevator so you are not sitting low.   Walk with walker as instructed.  You may resume a sexual relationship in one month or when given the OK by your caregiver.  Use walker as long as suggested by your caregivers.  Avoid periods of inactivity such as sitting longer than an hour when not asleep. This helps prevent blood clots.  You may return to work once you are cleared by your surgeon.  Do not drive a car for 6 weeks or until released by your surgeon.  Do not drive while taking narcotics.  Wear elastic stockings for two weeks following surgery during the day but you may remove then at night.  Make sure you keep all of your appointments after your operation with all of your doctors and caregivers. You should call the office at the above phone number and make an appointment for approximately two weeks after the date of your surgery. Please pick up a stool softener and laxative  for home use as long as you are requiring pain medications.  ICE to the affected hip every three hours for 30 minutes at a time and then as needed for pain and swelling. Continue to use ice on the hip for pain and swelling from surgery. You may notice swelling that will progress down to the foot and ankle.  This is normal after surgery.  Elevate the leg when you are not up walking on it.   It is important for you to complete the blood thinner medication as prescribed by your doctor.  Continue to use the breathing machine which will help keep your temperature down.  It is common for your temperature to cycle up and down following surgery, especially at night when you are not up moving around and exerting yourself.  The breathing machine keeps your lungs expanded and your temperature down.  RANGE OF MOTION AND STRENGTHENING EXERCISES  These exercises are designed to help you keep full movement of your hip joint. Follow your caregiver's or physical therapist's instructions. Perform all exercises about fifteen times, three times per day or as directed. Exercise both hips, even if you have had only one joint replacement. These exercises can be done on a training (exercise) mat, on the floor, on a table or on a bed. Use whatever works the best and is most comfortable for you. Use music or television while you are exercising so that the exercises are a pleasant break in your day. This   will make your life better with the exercises acting as a break in routine you can look forward to.  Lying on your back, slowly slide your foot toward your buttocks, raising your knee up off the floor. Then slowly slide your foot back down until your leg is straight again.  Lying on your back spread your legs as far apart as you can without causing discomfort.  Lying on your side, raise your upper leg and foot straight up from the floor as far as is comfortable. Slowly lower the leg and repeat.  Lying on your back, tighten up the  muscle in the front of your thigh (quadriceps muscles). You can do this by keeping your leg straight and trying to raise your heel off the floor. This helps strengthen the largest muscle supporting your knee.  Lying on your back, tighten up the muscles of your buttocks both with the legs straight and with the knee bent at a comfortable angle while keeping your heel on the floor.   SKILLED REHAB INSTRUCTIONS: If the patient is transferred to a skilled rehab facility following release from the hospital, a list of the current medications will be sent to the facility for the patient to continue.  When discharged from the skilled rehab facility, please have the facility set up the patient's Home Health Physical Therapy prior to being released. Also, the skilled facility will be responsible for providing the patient with their medications at time of release from the facility to include their pain medication and their blood thinner medication. If the patient is still at the rehab facility at time of the two week follow up appointment, the skilled rehab facility will also need to assist the patient in arranging follow up appointment in our office and any transportation needs.  MAKE SURE YOU:  Understand these instructions.  Will watch your condition.  Will get help right away if you are not doing well or get worse.  Pick up stool softner and laxative for home use following surgery while on pain medications. Daily dry dressing changes as needed. In 4 days, you may remove your dressings and begin taking showers - no tub baths or soaking the incisions. Continue to use ice for pain and swelling after surgery. Do not use any lotions or creams on the incision until instructed by your surgeon.   

## 2019-04-16 NOTE — Op Note (Signed)
OPERATIVE REPORT  SURGEON: Rod Can, MD   ASSISTANT: Staff.  PREOPERATIVE DIAGNOSIS: Left intertrochanteric femur fracture.   POSTOPERATIVE DIAGNOSIS: Left intertrochanteric femur fracture.   PROCEDURE: Intramedullary fixation, Left femur.   IMPLANTS: Stryker Gamma3 Hip Fracture Nail, 11 by 180 mm, 130 degrees. 10.5 x 110 mm Hip Fracture Nail Lag Screw. 5 x 37.5 mm distal interlocking screw 1.  ANESTHESIA:  General  ESTIMATED BLOOD LOSS:-150 mL    ANTIBIOTICS: 2 g Ancef.  DRAINS: None.  COMPLICATIONS: None.   CONDITION: PACU - hemodynamically stable.Marland Kitchen   BRIEF CLINICAL NOTE: Jesse [name] is a 85 y.o. male who presented with an intertrochanteric femur fracture. The patient was admitted to the hospitalist service and underwent perioperative risk stratification and medical optimization. The risks, benefits, and alternatives to the procedure were explained, and the patient elected to proceed.  PROCEDURE IN DETAIL: Surgical site was marked by myself. The patient was taken to the operating room and anesthesia was induced on the bed. The patient was then transferred to the [facility] table and the nonoperative lower extremity was scissored underneath the operative side. The fracture was reduced with traction, internal rotation, and adduction. The hip was prepped and draped in the normal sterile surgical fashion. Timeout was called verifying side and site of surgery. Preop antibiotics were given with 60 minutes of beginning the procedure.  Fluoroscopy was used to define the patient's anatomy. A 4 cm incision was made just proximal to the tip of the greater trochanter. The awl was used to obtain the standard starting point for a trochanteric entry nail under fluoroscopic control. The guidepin was placed. The entry reamer was used to open the proximal femur.  On the back table, the nail was assembled onto the jig. The nail was placed into the femur without any difficulty. Through a separate  stab incision, the cannula was placed down to the bone in preparation for the cephalomedullary device. A guidepin was placed into the femoral head using AP and lateral fluoroscopy views. The pin was measured, and then reaming was performed to the appropriate depth. The lag screw was inserted to the appropriate depth. The fracture was compressed through the jig. The setscrew was tightened and then loosened one quarter turn. A separate stab incision was created, and the distal interlocking screw was placed using standard AO technique. The jig was removed. Final AP and lateral fluoroscopy views were obtained to confirm fracture reduction and hardware placement. Tip apex distance was appropriate. There was no chondral penetration.  The wounds were copiously irrigated with saline. The wound was closed in layers with #1 Vicryl for the fascia, 2-0 Monocryl for the deep dermal layer, and 3-0 Monocryl subcuticular stitch. Glue was applied to the skin. Once the glue was fully hardened, sterile dressing was applied. The patient was then awakened from anesthesia and taken to the PACU in stable condition. Sponge needle and instrument counts were correct at the end of the case 2. There were no known complications.  We will readmit the patient to the hospitalist. Weightbearing status will be weightbearing as tolerated with a walker. We will begin Lovenox for DVT prophylaxis. The patient will work with physical therapy and undergo disposition planning.

## 2019-04-16 NOTE — TOC Initial Note (Signed)
Transition of Care [facility]) - Initial/Assessment Note    Patient Details  Name: Jesse [name] MRN: [id number] Date of Birth: 1928/06/10  Transition of Care [facility]) CM/SW Contact:    Erenest Rasher, RN Phone Number: [phone] 04/16/2019, 12:05 PM  Clinical Narrative:                 Spoke to pt's son, Rochelle Wajda, [place]. States he has documents and will provide. States he has discussed with pt and pt was at [facility] in the past and would like rehab post dc. Consent received to complete FL2 and fax out referral to SNF rehab. Pt lives alone in his home and was independent prior to fall and injury of hip. He drove to his appts. Has a RW at home but did not have to use DME. Son states post SNF stay pt will come and live with him in his home. He is currently working from home. Pt has [place] which son will review to possibly hire an aide with [place]. Handoff left for [facility] CM/CSW for SNF at dc. Will need PT/OT recommendations.   Expected Discharge Plan: [place] Barriers to Discharge: Continued Medical Work up   Patient Goals and CMS Choice Patient states their goals for this hospitalization and ongoing recovery are:: wants pt to get back to his baseline of independence, driving, walking and taking care of himsel at home CMS Medicare.gov Compare Post Acute Care list provided to:: Patient Represenative (must comment)(son, Cannon Kettle, [facility]) Choice offered to / list presented to : Adult Children  Expected Discharge Plan and Services Expected Discharge Plan: Skilled Nursing Facility In-house Referral: Clinical Social Work Discharge Planning Services: CM Consult Post Acute Care Choice: [place] Living arrangements for the past 2 months: [place] Expected Discharge Date: 04/18/19                                    Prior Living Arrangements/Services Living arrangements for the past 2 months: Single Family  Home Lives with:: Self Patient language and need for interpreter reviewed:: Yes Do you feel safe going back to the place where you live?: Yes        Care giver support system in place?: Yes (comment) Current home services: DME(rolling walker) Criminal Activity/Legal Involvement Pertinent to Current Situation/Hospitalization: No - Comment as needed  Activities of Daily Living Home Assistive Devices/Equipment: Eyeglasses ADL Screening (condition at time of admission) Patient's cognitive ability adequate to safely complete daily activities?: Yes Is the patient deaf or have difficulty hearing?: Yes Does the patient have difficulty seeing, even when wearing glasses/contacts?: No Does the patient have difficulty concentrating, remembering, or making decisions?: No Patient able to express need for assistance with ADLs?: Yes Does the patient have difficulty dressing or bathing?: No Independently performs ADLs?: Yes (appropriate for developmental age) Does the patient have difficulty walking or climbing stairs?: Yes Weakness of Legs: Left Weakness of Arms/Hands: None  Permission Sought/Granted Permission sought to share information with : Case Manager, PCP, Family Supports, Customer service manager Permission granted to share information with : Yes, Verbal Permission Granted  Share Information with NAME: Barnabas Billock  Permission granted to share info w AGENCY: [facility], SNF  Permission granted to share info w Relationship: son  Permission granted to share info w Contact Information: [phone]  Emotional Assessment  Psych Involvement: No (comment)  Admission diagnosis:  Fall [W19.XXXA] Patient Active Problem List   Diagnosis Date Noted  . Hip fracture ([place]) 04/15/2019  . Fall   . Coronary artery disease involving native coronary artery of native heart without angina pectoris 11/20/2018  . Thoracic aortic aneurysm without rupture ([place]) 11/20/2018  . Chronic  systolic heart failure ([place]) 11/20/2018  . Duodenal ulcer with hemorrhage 05/05/2018  . Hemorrhagic shock ([place]) 05/05/2018  . AKI (acute kidney injury) ([place]) 05/05/2018  . Orthostatic hypotension 05/04/2018  . Symptomatic anemia 05/04/2018  . Melena 05/04/2018  . GI bleed 05/04/2018  . Patella fracture 06/09/2015  . Aftercare following surgery of the circulatory system, [place] 01/08/2013  . Pain in limb- Left popliteal 12/25/2012  . 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 Aneurysm of abdominal vessel ([place]) 02/21/2012  . Aneurysm of iliac artery ([facility]) 08/09/2011  . Aneurysm of artery of lower extremity ([place]) 08/09/2011  . [place], [place] 01/16/2009  . Essential hypertension 01/16/2009  . CAD, ARTERY BYPASS GRAFT 01/16/2009  . PERIPHERAL VASCULAR DISEASE 01/16/2009  . ABDOMINAL AORTIC ANEURYSM REPAIR, HX OF 01/16/2009  . MIXED HYPERLIPIDEMIA 12/28/2008   PCP:  [name], MD Pharmacy:   [facility] ([place]) [place], [place] [address] Phone: [phone] Fax: [phone]  [place], [place] [address] Phone: [phone] Fax: [phone]  [facility] [address] [place], [place] [address] Phone: [phone] Fax: [phone]     Social Determinants of Health (SDOH) Interventions    Readmission Risk Interventions No flowsheet data found.

## 2019-04-16 NOTE — Plan of Care (Signed)
Plan of care initiated and discussed with the patient. 

## 2019-04-16 NOTE — Progress Notes (Signed)
PROGRESS NOTE    Jesse [name]  [id number] DOB: 1928/05/15 DOA: 04/15/2019 PCP: [name], MD   Brief Narrative:85 y.o. male with medical history significant of CAD, hypertension, MI, right bundle branch block, AAA, prostate cancer, came in with complaints of left hip pain after falling last night.  Patient does not know how he fell however he thinks that he is slipped on his home shoes or his feet got caught on his home shoes and he slipped and fell.  According to his son he always walks dragging his feet which is normal for him.  He denies any chest pain shortness of breath nausea vomiting diarrhea.  He denies any fever chills cough.  At baseline he stays home watches TV all night and sleeps all day.  He sees Dr. early for vascular issues and aneurysms.  He denies any urinary complaints.  ED Course: Blood pressure 96/63 pulse is 7595% on room air.  Sodium 137 potassium 3.7 BUN 30 creatinine 1.18 white count 11.1 hemoglobin 11.9 platelet count 192.  MRI of the hip shows incomplete nondisplaced intertrochanteric fracture of the proximal left femur intramuscular hematoma of the left gluteus maximus muscle.  COVID-19 pending.  Son reports that he does not leave his house and go anywhere.  He has not had any sick contacts  Assessment & Plan:   Active Problems:   Hip fracture ([place])   #1 left hip fracture status post fall denies LOC.  Denies any chest pain shortness of breath nausea vomiting diarrhea.  Patient to go for surgery today.   #2 history of CAD and CABG in the 1992-last echo I have is from 05/2018 with ejection fraction 40 to 45%.  I will order an echocardiogram.  I will continue beta-blocker for perioperative cardiac protection.  Appreciate cardiology input.  #3 dehydration family reports he has not had anything to eat for the last 24 hours since a fall.  His urine does appear very dark and his oral mucosa is very dry.  I will give him IV fluids.  #4 history of hypertension now  with soft blood pressure I will hold antihypertensives he was taking at home.  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 Donnetta Hutching stable  #7 history of upper GI bleed with duodenal ulcer I will put him on PPI  DVT prophylaxis: SCD will start Lovenox after surgery Code Status: DNR Family Communication: Discussed with son Disposition Plan: Pending clinical improvement Consults called:  Ortho and cardiology  Estimated body mass index is 24.01 kg/m as calculated from the following:   Height as of this encounter: 6' (1.829 m).   Weight as of this encounter: 80.3 kg.     Subjective: Resting in bed no overnight events reported by the staff  Objective: Vitals:   04/15/19 2126 04/16/19 0610 04/16/19 1121 04/16/19 1133  BP: 133/76 113/72 109/89   Pulse: 67 74 72   Resp: 16 16 18    Temp: 98.2 F (36.8 C) 98.3 F (36.8 C) 99.2 F (37.3 C)   TempSrc: Oral Oral Oral   SpO2: 93% 92% 93%   Weight:    80.3 kg  Height:    6' (1.829 m)    Intake/Output Summary (Last 24 hours) at 04/16/2019 1333 Last data filed at 04/16/2019 1000 Gross per 24 hour  Intake 1447.49 ml  Output 375 ml  Net 1072.49 ml   Filed Weights   04/15/19 0903 04/16/19 1133  Weight: 80.3 kg 80.3 kg    Examination:  General exam: Appears calm and comfortable  Respiratory system: Clear to auscultation. Respiratory effort normal. Cardiovascular system: S1 & S2 heard, RRR. No JVD, murmurs, rubs, gallops or clicks. No pedal edema. Gastrointestinal system: Abdomen is nondistended, soft and nontender. No organomegaly or masses felt. Normal bowel sounds heard. Central nervous system: Alert and oriented. No focal neurological deficits. Extremities: Tender left hip with movement skin: No rashes, lesions or ulcers Psychiatry: Judgement and insight  appear normal. Mood & affect appropriate.     Data Reviewed: I have personally reviewed following labs and imaging studies  CBC: Recent Labs  Lab 04/15/19 1218 04/16/19 0308  WBC 11.1* 6.4  HGB 11.9* 9.9*  HCT 37.6* 31.4*  MCV 93.3 94.3  PLT 192 [id number]   Basic Metabolic Panel: Recent Labs  Lab 04/15/19 1218 04/16/19 0308  NA 137 136  K 3.7 3.8  CL 107 110  CO2 22 18*  GLUCOSE 112* 93  BUN 30* 26*  CREATININE 1.18 0.95  CALCIUM 9.4 8.5*   GFR: Estimated Creatinine Clearance: 59 mL/min (by C-G formula based on SCr of 0.95 mg/dL). Liver Function Tests: Recent Labs  Lab 04/16/19 0308  AST 17  ALT 15  ALKPHOS 35*  BILITOT 1.7*  PROT 5.2*  ALBUMIN 3.1*   No results for input(s): LIPASE, AMYLASE in the last 168 hours. No results for input(s): AMMONIA in the last 168 hours. Coagulation Profile: Recent Labs  Lab 04/15/19 1218 04/16/19 0308  INR 1.2 1.4*   Cardiac Enzymes: No results for input(s): CKTOTAL, CKMB, CKMBINDEX, TROPONINI in the last 168 hours. BNP (last 3 results) No results for input(s): PROBNP in the last 8760 hours. HbA1C: No results for input(s): HGBA1C in the last 72 hours. CBG: No results for input(s): GLUCAP in the last 168 hours. Lipid Profile: No results for input(s): CHOL, HDL, LDLCALC, TRIG, CHOLHDL, LDLDIRECT in the last 72 hours. Thyroid Function Tests: No results for input(s): TSH, T4TOTAL, FREET4, T3FREE, THYROIDAB in the last 72 hours. Anemia Panel: No results for input(s): VITAMINB12, FOLATE, FERRITIN, TIBC, IRON, RETICCTPCT in the last 72 hours. Sepsis Labs: No results for input(s): PROCALCITON, LATICACIDVEN in the last 168 hours.  Recent Results (from the past 240 hour(s))  SARS Coronavirus 2 [facility] order, Performed in [facility] hospital lab) Nasopharyngeal Nasopharyngeal Swab     Status: None   Collection Time: 04/15/19  6:38 PM   Specimen: Nasopharyngeal Swab  Result Value Ref Range Status   SARS Coronavirus 2  NEGATIVE NEGATIVE Final    Comment: (NOTE) If result is NEGATIVE SARS-CoV-2 target nucleic acids are NOT DETECTED. The SARS-CoV-2 RNA is generally detectable in upper and lower  respiratory specimens during the acute phase of infection. The lowest  concentration of SARS-CoV-2 viral copies this assay can detect is 250  copies / mL. A negative result does not preclude SARS-CoV-2 infection  and should not be used as the sole basis for treatment or other  patient management decisions.  A negative result may occur with  improper specimen collection / handling, submission of specimen other  than nasopharyngeal swab, presence of viral mutation(s) within the  areas targeted by this assay, and inadequate number of viral copies  (<250 copies / mL). A negative result must be combined with clinical  observations, patient history, and epidemiological information. If result is POSITIVE SARS-CoV-2 target nucleic acids are DETECTED. The  SARS-CoV-2 RNA is generally detectable in upper and lower  respiratory specimens dur ing the acute phase of infection.  Positive  results are indicative of active infection with SARS-CoV-2.  Clinical  correlation with patient history and other diagnostic information is  necessary to determine patient infection status.  Positive results do  not rule out bacterial infection or co-infection with other viruses. If result is PRESUMPTIVE POSTIVE SARS-CoV-2 nucleic acids MAY BE PRESENT.   A presumptive positive result was obtained on the submitted specimen  and confirmed on repeat testing.  While 2019 novel coronavirus  (SARS-CoV-2) nucleic acids may be present in the submitted sample  additional confirmatory testing may be necessary for epidemiological  and / or clinical management purposes  to differentiate between  SARS-CoV-2 and other Sarbecovirus currently known to infect humans.  If clinically indicated additional testing with an alternate test  methodology [phone])  is advised. The SARS-CoV-2 RNA is generally  detectable in upper and lower respiratory sp ecimens during the acute  phase of infection. The expected result is Negative. Fact Sheet for Patients:  StrictlyIdeas.no Fact Sheet for Healthcare Providers: BankingDealers.co.za This test is not yet approved or cleared by the [place] FDA and has been authorized for detection and/or diagnosis of SARS-CoV-2 by FDA under an Emergency Use Authorization (EUA).  This EUA will remain in effect (meaning this test can be used) for the duration of the COVID-19 declaration under Section 564(b)(1) of the Act, 21 U.S.C. section 360bbb-3(b)(1), unless the authorization is terminated or revoked sooner. Performed at [facility], [place] [address], [place], [place] [address]   Surgical pcr screen     Status: None   Collection Time: 04/15/19  9:39 PM   Specimen: Nasal Mucosa; Nasal Swab  Result Value Ref Range Status   MRSA, PCR NEGATIVE NEGATIVE Final   Staphylococcus aureus NEGATIVE NEGATIVE Final    Comment: (NOTE) The Xpert SA Assay (FDA approved for NASAL specimens in patients 80 years of age and older), is one component of a comprehensive surveillance program. It is not intended to diagnose infection nor to guide or monitor treatment. Performed at [facility], [place] [address] [place], [place] [address]          Radiology Studies: Dg Chest 1 View  Result Date: 04/15/2019 CLINICAL DATA:  Preop.  Hypertension. EXAM: CHEST  1 VIEW COMPARISON:  01/01/2016 FINDINGS: Prior CABG. Mild cardiomegaly. No confluent opacities or effusions. No acute bony abnormality. IMPRESSION: No active disease. Electronically Signed   By: Rolm Baptise M.D.   On: 04/15/2019 18:14   Dg Knee 1-2 Views Left  Result Date: 04/15/2019 CLINICAL DATA:  Hip fracture, left leg pain EXAM: LEFT KNEE - 1-2 VIEW COMPARISON:  Lower extremity CT  angiogram 12/31/2012 FINDINGS: No acute fracture. No malalignment. Joint spaces are relatively preserved. No joint effusion. Extensive vascular calcifications with focal enlargement of the calcification in the region of the popliteal artery measuring 3.6 cm in diameter suggesting a popliteal artery aneurysm. Comparison to CT angiogram 12/31/2012 demonstrated a 3.0 cm aneurysm at this site. IMPRESSION: 1. No acute osseous abnormality, left knee. 2. Extensive atherosclerosis with an enlarging popliteal artery aneurysm measuring approximately 3.6 cm in diameter. Electronically Signed   By: Davina Poke M.D.   On: 04/15/2019 19:12   Dg Knee 2 Views Right  Result Date: 04/15/2019 CLINICAL DATA:  Preop EXAM: RIGHT KNEE - 1-2 VIEW COMPARISON:  06/01/2015 FINDINGS: Screws and cerclage wires noted within the patella. For mild  degenerative changes in the right knee, best seen in the patellofemoral compartment with joint space narrowing and spurring. No fracture, subluxation or dislocation. Atherosclerotic calcifications within the femoral and popliteal arteries. There appears to be a popliteal artery aneurysm measuring approximately 3 cm above the right knee (there may be magnification, over estimating the size). IMPRESSION: Postoperative changes in the patella. No acute bony abnormality. Early degenerative changes. Vascular calcifications. Concern for popliteal artery aneurysm. This could be further evaluated with ultrasound or CT if felt clinically indicated. Electronically Signed   By: Rolm Baptise M.D.   On: 04/15/2019 18:24   Mr Hip Left Wo Contrast  Result Date: 04/15/2019 CLINICAL DATA:  Left hip pain secondary to a fall last night. EXAM: MR OF THE LEFT HIP WITHOUT CONTRAST TECHNIQUE: Multiplanar, multisequence MR imaging was performed. No intravenous contrast was administered. COMPARISON:  Radiographs dated 04/15/2019 and CT scan of the abdomen and pelvis dated 05/04/2018 FINDINGS: Bones: There is an  incomplete nondisplaced intertrochanteric fracture of the proximal left femur extending from the tip of the left greater trochanter approximately 60% of the way through the intertrochanteric region. The other bones of the pelvis are intact. Articular cartilage and labrum Articular cartilage: Slight thinning of the articular cartilage of the posterior aspect of the joint. Labrum:  Intact. Joint or bursal effusion Joint effusion:  No joint effusion. Bursae: No bursitis. Muscles and tendons Muscles and tendons: There is a 5 x 5 x 3 cm intramuscular hematoma in the left gluteus maximus muscle posterior to the left greater trochanter. There is extensive edema in the gluteus maximus muscle. There is also edema in the left quadratus femoris muscle as well as slight edema along the left iliopsoas tendon. Other findings Miscellaneous: The patient has chronic massive internal iliac artery aneurysms, 8.6 cm on the right and 7.3 cm on the left. There appears to be blood flow in portions of each of these large aneurysms. These were demonstrated on the prior CT scan of the abdomen and pelvis dated 05/04/2018. IMPRESSION: 1. Incomplete nondisplaced intertrochanteric fracture of the proximal left femur. 2. Strains of the left gluteus maximus, left quadratus femoris, and left iliopsoas muscles. Intramuscular hematoma of the left gluteus maximus muscle 3. Chronic massive internal iliac artery aneurysms. Electronically Signed   By: Lorriane Shire M.D.   On: 04/15/2019 15:57   Dg Hip Unilat With Pelvis 2-3 Views Left  Result Date: 04/15/2019 CLINICAL DATA:  Fall with left-sided hip pain. EXAM: DG HIP (WITH OR WITHOUT PELVIS) 2-3V LEFT COMPARISON:  None. FINDINGS: Subtle irregularity of the lesser trochanter is noted on dedicated views of the left hip. Right hip is located. Patient is osteopenic with spinal degenerative changes and signs of vascular disease with postoperative changes and vascular calcifications overlying the hip and  pelvis in various locations. The left hip is located. IMPRESSION: Question subtle left intertrochanteric fracture in this osteopenic patient. Further imaging with MRI may be helpful. Electronically Signed   By: Zetta Bills M.D.   On: 04/15/2019 10:07        Scheduled Meds: . [MAR Hold] carvedilol  12.5 mg Oral BID WC   Continuous Infusions: . sodium chloride 100 mL/hr at 04/16/19 1000  . lactated ringers 50 mL/hr at 04/16/19 1141     LOS: 1 day     Georgette Shell, MD [facility]  If 7PM-7AM, please contact night-coverage [email or web address] Password [email or web address] 04/16/2019, 1:33 PM

## 2019-04-16 NOTE — Transfer of Care (Signed)
Immediate Anesthesia Transfer of Care Note  Patient: Jesse [name]  Procedure(s) Performed: INTRAMEDULLARY (IM) NAIL INTERTROCHANTRIC (Left Leg Upper)  Patient Location: PACU  Anesthesia Type:General  Level of Consciousness: awake, alert , oriented and patient cooperative  Airway & Oxygen Therapy: Patient Spontanous Breathing and Patient connected to face mask oxygen  Post-op Assessment: Report given to RN and Post -op Vital signs reviewed and stable  Post vital signs: Reviewed and stable  Last Vitals:  Vitals Value Taken Time  BP 122/81 04/16/19 1434  Temp 36.4 C 04/16/19 1434  Pulse 74 04/16/19 1439  Resp 19 04/16/19 1439  SpO2 97 % 04/16/19 1439  Vitals shown include unvalidated device data.  Last Pain:  Vitals:   04/16/19 1133  TempSrc:   PainSc: 0-No pain      Patients Stated Pain Goal: 5 ([id number])  Complications: No apparent anesthesia complications

## 2019-04-16 NOTE — Anesthesia Preprocedure Evaluation (Signed)
Anesthesia Evaluation  Patient identified by MRN, date of birth, ID band Patient awake    Reviewed: Allergy & Precautions, H&P , NPO status , Patient's Chart, lab work & pertinent test results  Airway Mallampati: II  TM Distance: >3 FB Neck ROM: full    Dental  (+) Teeth Intact, Dental Advisory Given, Caps, Implants,    Pulmonary neg shortness of breath, neg COPD, neg recent URI, former smoker,   About 1 week ago had productive cough.  Lungs bilat. Clear, w/ decrease in bases  breath sounds clear to auscultation       Cardiovascular hypertension, Pt. on medications and Pt. on home beta blockers (-) angina+ CAD, + Past MI, + CABG and + Peripheral Vascular Disease   Rhythm:Regular Rate:Normal     Neuro/Psych negative neurological ROS  negative psych ROS   GI/Hepatic Neg liver ROS, GERD  ,? GI bleed   Endo/Other  negative endocrine ROS  Renal/GU Renal InsufficiencyRenal disease     Musculoskeletal  (+) Arthritis ,   Abdominal   Peds  Hematology negative hematology ROS (+) anemia ,   Anesthesia Other Findings S/p CABG last visit with Cardiology March with no new concerns S/p AAA repair    Upper [place] bridge   Reproductive/Obstetrics                             Anesthesia Physical  Anesthesia Plan  ASA: III  Anesthesia Plan: General   Post-op Pain Management:    Induction: Intravenous  PONV Risk Score and Plan: 2 and Treatment may vary due to age or medical condition  Airway Management Planned: Oral ETT  Additional Equipment: None  Intra-op Plan:   Post-operative Plan: Extubation in OR  Informed Consent: I have reviewed the patients History and Physical, chart, labs and discussed the procedure including the risks, benefits and alternatives for the proposed anesthesia with the patient or authorized representative who has indicated his/her understanding and acceptance.      Dental advisory given  Plan Discussed with: CRNA  Anesthesia Plan Comments:         Anesthesia Quick Evaluation

## 2019-04-16 NOTE — Anesthesia Procedure Notes (Signed)
Procedure Name: Intubation Date/Time: 04/16/2019 1:05 PM Performed by: Garrel Ridgel, CRNA Pre-anesthesia Checklist: Patient identified, Emergency Drugs available, Suction available, Patient being monitored and Timeout performed Patient Re-evaluated:Patient Re-evaluated prior to induction Oxygen Delivery Method: Circle system utilized Preoxygenation: Pre-oxygenation with 100% oxygen Induction Type: IV induction Ventilation: Mask ventilation without difficulty Laryngoscope Size: Mac and 4 Grade View: Grade II Tube type: Oral Tube size: 7.5 mm Number of attempts: 1 Airway Equipment and Method: Stylet Placement Confirmation: ETT inserted through vocal cords under direct vision,  breath sounds checked- equal and bilateral and positive ETCO2 Secured at: 22 cm Tube secured with: Tape Dental Injury: Teeth and Oropharynx as per pre-operative assessment

## 2019-04-17 ENCOUNTER — Encounter (HOSPITAL_COMMUNITY): Payer: Self-pay | Admitting: Orthopedic Surgery

## 2019-04-17 ENCOUNTER — Inpatient Hospital Stay (HOSPITAL_COMMUNITY): Payer: Medicare Other

## 2019-04-17 LAB — COMPREHENSIVE METABOLIC PANEL
ALT: 15 U/L (ref 0–44)
AST: 17 U/L (ref 15–41)
Albumin: 2.9 g/dL — ABNORMAL LOW (ref 3.5–5.0)
Alkaline Phosphatase: 37 U/L — ABNORMAL LOW (ref 38–126)
Anion gap: 8 (ref 5–15)
BUN: 22 mg/dL (ref 8–23)
CO2: 20 mmol/L — ABNORMAL LOW (ref 22–32)
Calcium: 8.6 mg/dL — ABNORMAL LOW (ref 8.9–10.3)
Chloride: 108 mmol/L (ref 98–111)
Creatinine, Ser: 0.98 mg/dL (ref 0.61–1.24)
GFR calc Af Amer: >60 mL/min (ref >60)
GFR calc non Af Amer: >60 mL/min (ref >60)
Glucose, Bld: 108 mg/dL — ABNORMAL HIGH (ref 70–99)
Potassium: 4 mmol/L (ref 3.5–5.1)
Sodium: 136 mmol/L (ref 135–145)
Total Bilirubin: 1.6 mg/dL — ABNORMAL HIGH (ref 0.3–1.2)
Total Protein: 5.1 g/dL — ABNORMAL LOW (ref 6.5–8.1)

## 2019-04-17 LAB — CBC
HCT: 30.2 % — ABNORMAL LOW (ref 39.0–52.0)
Hemoglobin: 9.4 g/dL — ABNORMAL LOW (ref 13.0–17.0)
MCH: 30 pg (ref 26.0–34.0)
MCHC: 31.1 g/dL (ref 30.0–36.0)
MCV: 96.5 fL (ref 80.0–100.0)
Platelets: 144 10*3/uL — ABNORMAL LOW (ref 150–400)
RBC: 3.13 MIL/uL — ABNORMAL LOW (ref 4.22–5.81)
RDW: 13.4 % (ref 11.5–15.5)
WBC: 8.3 10*3/uL (ref 4.0–10.5)
nRBC: 0 % (ref 0.0–0.2)

## 2019-04-17 MED ORDER — SODIUM CHLORIDE 0.9 % IV SOLN
INTRAVENOUS | Status: AC
  Administered 2019-04-17: 17:00:00 via INTRAVENOUS

## 2019-04-17 MED ORDER — OXYCODONE-ACETAMINOPHEN 5-325 MG PO TABS
1.0000 | ORAL_TABLET | ORAL | Status: AC | PRN
  Administered 2019-04-17 – 2019-04-18 (×4): 1 via ORAL
  Filled 2019-04-17 (×4): qty 1

## 2019-04-17 MED ORDER — METHOCARBAMOL 1000 MG/10ML IJ SOLN
500.0000 mg | Freq: Four times a day (QID) | INTRAVENOUS | Status: DC | PRN
  Filled 2019-04-17: qty 5

## 2019-04-17 MED ORDER — ASPIRIN EC 81 MG PO TBEC
81.0000 mg | DELAYED_RELEASE_TABLET | Freq: Every day | ORAL | Status: DC
  Administered 2019-04-17 – 2019-04-20 (×4): 81 mg via ORAL
  Filled 2019-04-17 (×4): qty 1

## 2019-04-17 MED ORDER — METHOCARBAMOL 500 MG PO TABS
500.0000 mg | ORAL_TABLET | Freq: Four times a day (QID) | ORAL | Status: DC | PRN
  Administered 2019-04-17 – 2019-04-20 (×7): 500 mg via ORAL
  Filled 2019-04-17 (×7): qty 1

## 2019-04-17 NOTE — Progress Notes (Signed)
   04/17/19 1703  MEWS Score  Resp 16  Pulse Rate 81  BP (!) 88/58  Temp 98.2 F (36.8 C)  Level of Consciousness Alert  SpO2 93 %  O2 Device Room Air   Patient is asymptomatic. Will update MD and carry out new orders.

## 2019-04-17 NOTE — Progress Notes (Signed)
    Pt did well with surgery   No active cardiac issues. Follow up with Dr. Burt Knack.  [facility] will sign off.   Medication Recommendations:   Cont. meds  Other recommendations (labs, testing, etc):   Follow up as an outpatient:   With Dr. Aundra Millet, MD  04/17/2019 1:15 PM    [place],  [address] [place], [place]  [address] Pager [phone] Phone: [phone]; Fax: [phone]

## 2019-04-17 NOTE — Evaluation (Signed)
Physical Therapy Evaluation Patient Details Name: Jesse [name] MRN: [id number] DOB: 09/13/1927 Today's Date: 04/17/2019   History of Present Illness  85 y.o. male with medical history significant of CAD, hypertension, MI, right bundle branch block, AAA, prostate cancer. Pt admitted s/p fall on with Lt intertrochanteric femur fracture now s/p IM nail.    Clinical Impression  Jesse [name] is a 85 y.o. male POD 1 s/p Lt intermedullary nail for Lt intertrochanteric fracture. Patient reports he was mobilizing with no device at home at baseline. Patient is now limited by functional impairments (see PT problem list below) and requires min assist for bed mobility, transfers, and gait with RW. Patient was able to perform sit<>stand transfer 3x with RW. He performed stand step transfer with RW with cues for step sequencing to sit in bedside recliner. He was limited by pain and was reliant on UE's to reduce WB in [facility] performed. Patient instructed in exercise to facilitate ROM and circulation to manage edema. Patient will benefit from continued skilled PT interventions to address impairments and progress towards PLOF. He currently requires 24 hour supervision and assistance with mobility and will benefit from further skilled therapy at below venue if family cannot provide 24/7 assistance. Acute PT will follow to progress mobility and stair training in preparation for safe discharge home.      Follow Up Recommendations SNF    Equipment Recommendations  None recommended by PT    Recommendations for Other Services       Precautions / Restrictions Precautions Precautions: Fall Restrictions Weight Bearing Restrictions: No LLE Weight Bearing: Weight bearing as tolerated      Mobility  Bed Mobility Overal bed mobility: Needs Assistance Bed Mobility: Supine to Sit     Supine to sit: Min assist;HOB elevated     General bed mobility comments: pt requires cues for seqeuncing and to assist with Lt  LE movement, pt using bed rails, assist required to push up to achieve upright seated posture  Transfers Overall transfer level: Needs assistance Equipment used: Rolling walker (2 wheeled) Transfers: Sit to/from [facility] Sit to Stand: Min assist;From elevated surface         General transfer comment: pt requires cues for safe hand placement and to initaite power up from elevated EOB, assist to steady upon rising. Pt able to take lateral steps with RW and turn to perform stand step transfer to sit in bedside recliner, pt reliant on UE support to reduce WB in Lt LE. 3 reps sit<>stand performed total for pt to utilize urinal in standing.  Ambulation/Gait Ambulation/Gait assistance: Min assist   Assistive device: Rolling walker (2 wheeled)       General Gait Details: deferred secondary to pain  Stairs            Wheelchair Mobility    Modified Rankin (Stroke Patients Only)       Balance Overall balance assessment: Needs assistance Sitting-balance support: Feet supported;No upper extremity supported Sitting balance-Leahy Scale: Fair Sitting balance - Comments: pt required assist to press up to sitting, able to maintain balance on his own   Standing balance support: During functional activity;Bilateral upper extremity supported Standing balance-Leahy Scale: Poor Standing balance comment: pt heavily reliant on UE support for standing balance; pt maintained standing balance for ~ 5 minutes to complete toileting activity with urinal with 1 hand on RW and support from therapist at other arm            Pertinent Vitals/Pain  Pain Assessment: 0-10 Pain Score: 4  Pain Location: Lt hip Pain Descriptors / Indicators: Aching;Sore Pain Intervention(s): Limited activity within patient's tolerance;Monitored during session;Ice applied    Home Living Family/patient expects to be discharged to:: Private residence Living Arrangements: Alone Available Help at  Discharge: Family;Available PRN/intermittently Type of Home: House Home Access: Stairs to enter Entrance Stairs-Rails: Right;Left;Can reach both Entrance Stairs-Number of Steps: 3 Home Layout: Able to live on main level with bedroom/bathroom;Two level Home Equipment: Cane - single point;Walker - 2 wheels;Shower seat Additional Comments: pt reports his has a bedroom for him at his house, he states his daughter has been suggesting he move into an ALF for some time now, both of his children work and are not available to assist him 24/7    Prior Function Level of Independence: Independent         Comments: pt reports he was ambulating with no device prior to this fall     Hand Dominance   Dominant Hand: Right    Extremity/Trunk Assessment   Upper Extremity Assessment Upper Extremity Assessment: Generalized weakness    Lower Extremity Assessment Lower Extremity Assessment: Generalized weakness;LLE deficits/detail LLE Deficits / Details: pt with good quad activation LLE: Unable to fully assess due to pain       Communication   Communication: HOH  Cognition Arousal/Alertness: Awake/alert Behavior During Therapy: WFL for tasks assessed/performed Overall Cognitive Status: Within Functional Limits for tasks assessed           General Comments      Exercises Total Joint Exercises Ankle Circles/Pumps: AROM;Seated;10 reps;Both Quad Sets: AROM;5 reps;Supine;Left   Assessment/Plan    PT Assessment Patient needs continued PT services  PT Problem List Decreased strength       PT Treatment Interventions      PT Goals (Current goals can be found in the Care Plan section)  Acute Rehab PT Goals Patient Stated Goal: pt wants to walk independently again PT Goal Formulation: With patient Time For Goal Achievement: 04/24/19 Potential to Achieve Goals: Good    Frequency Min 3X/week    AM-PAC PT "6 Clicks" Mobility  Outcome Measure Help needed turning from your back to  your side while in a flat bed without using bedrails?: A Little Help needed moving from lying on your back to sitting on the side of a flat bed without using bedrails?: A Little Help needed moving to and from a bed to a chair (including a wheelchair)?: A Little Help needed standing up from a chair using your arms (e.g., wheelchair or bedside chair)?: A Little Help needed to walk in hospital room?: A Lot Help needed climbing 3-5 steps with a railing? : A Lot 6 Click Score: 16    End of Session Equipment Utilized During Treatment: Gait belt Activity Tolerance: Patient tolerated treatment well Patient left: in chair;with call bell/phone within reach;with chair alarm set Nurse Communication: Mobility status PT Visit Diagnosis: Unsteadiness on feet (R26.81);Other abnormalities of gait and mobility (R26.89);Muscle weakness (generalized) (M62.81);History of falling (Z91.81);Difficulty in walking, not elsewhere classified (R26.2)    Time: [id number] PT Time Calculation (min) (ACUTE ONLY): 36 min   Charges:   PT Evaluation $PT Eval Moderate Complexity: 1 Mod PT Treatments $Therapeutic Activity: 8-22 mins       Kipp Brood, PT, DPT, [facility] Physical Therapist with [facility]  04/17/2019 1:54 PM

## 2019-04-17 NOTE — Progress Notes (Signed)
    Subjective:  Patient reports pain as mild to moderate.  Denies N/V/CP/SOB. Pain control issues o/n - better now.  Objective:   VITALS:   Vitals:   04/17/19 0148 04/17/19 0546 04/17/19 0821 04/17/19 0950  BP: 108/71 115/67 117/69   Pulse: 79 71 73   Resp: 18 14 14    Temp: 98.6 F (37 C) 97.7 F (36.5 C)    TempSrc: Oral Oral    SpO2: 96% 94% 96% 97%  Weight:      Height:        NAD ABD soft Sensation intact distally Intact pulses distally Dorsiflexion/Plantar flexion intact Incision: dressing C/D/I Compartment soft   Lab Results  Component Value Date   WBC 8.3 04/17/2019   HGB 9.4 (L) 04/17/2019   HCT 30.2 (L) 04/17/2019   MCV 96.5 04/17/2019   PLT 144 (L) 04/17/2019   BMET    Component Value Date/Time   NA 136 04/17/2019 0304   NA 141 03/02/2017 0753   K 4.0 04/17/2019 0304   CL 108 04/17/2019 0304   CO2 20 (L) 04/17/2019 0304   GLUCOSE 108 (H) 04/17/2019 0304   GLUCOSE 100 (H) 05/05/2006 0839   BUN 22 04/17/2019 0304   BUN 26 03/02/2017 0753   CREATININE 0.98 04/17/2019 0304   CREATININE 0.99 03/10/2014 1445   CALCIUM 8.6 (L) 04/17/2019 0304   GFRNONAA >60 04/17/2019 0304   GFRAA >60 04/17/2019 0304     Assessment/Plan: 1 Day Post-Op   Active Problems:   Hip fracture (HCC)   WBAT with walker DVT ppx: Lovenox, SCDs, TEDS PO pain control PT/OT Dispo: D/C planning   Hilton Cork Carmina Walle 04/17/2019, 10:51 AM   Rod Can, MD Cell: [phone] [place] is now [facility]  [place] [address], [address], [place], [place] [address] Phone: [phone] [email or web address] Facebook  [employer]

## 2019-04-17 NOTE — Progress Notes (Signed)
   04/17/19 1400  Vitals  Temp 100.1 F (37.8 C) (Notified RN will recheck in 1 hour)  Temp Source Oral  BP 130/84  BP Location Left Arm  BP Method Automatic  Patient Position (if appropriate) Lying  Pulse Rate 78  Pulse Rate Source Monitor  Resp 12  MD notified. Will continue to monitor patient and keep MD updated.

## 2019-04-17 NOTE — Progress Notes (Signed)
PROGRESS NOTE    Jesse [name]  [id number] DOB: 1927/11/30 DOA: 04/15/2019 PCP: [name], MD   Brief Narrative:85 y.o.malewith medical history significant ofCAD, hypertension, MI, right bundle branch block, AAA, prostate cancer, came in with complaints of left hip pain after falling last night. Patient does not know how he fell however he thinks that he is slipped on his home shoes or his feet got caught on his home shoes and he slipped and fell. According to his son he always walks dragging his feet which is normal for him. He denies any chest pain shortness of breath nausea vomiting diarrhea. He denies any fever chills cough. At baseline he stays home watches TV all night and sleeps all day. He sees Dr. early for vascular issues and aneurysms. He denies any urinary complaints.  ED Course:Blood pressure 96/63pulse is 7595% on room air. Sodium 137 potassium 3.7 BUN 30 creatinine 1.18 white count 11.1 hemoglobin 11.9 platelet count 192. MRI of the hip shows incomplete nondisplaced intertrochanteric fracture of the proximal left femur intramuscular hematoma of the left gluteus maximus muscle. COVID-19 pending. Son reports that he does not leave his house and go anywhere. He has not had any sick contacts   Assessment & Plan:   Active Problems:   Hip fracture (HCC)  #1 left hip fracture status post fall deniesLOC.Denies any chest pain shortness of breath nausea vomiting diarrhea.    He is status post intramedullary fixation of the left femur.  His activity has been increased to weightbearing as tolerated today.  Await PT evaluation.  #2 history of CAD and CABG in the 1992-last echo I have is from 05/2018 with ejection fraction 40 to 45%.  Continue beta-blocker.  Appreciate cardiology input.  Restart baby aspirin.  #3 dehydration patient received IV fluids  #4 history of hypertension now with soft blood pressure I will hold antihypertensives he was taking at  home.  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 Donnetta Hutching stable  #7 history of upper GI bleed with duodenal ulcer I will put him on PPI  #8 anemia chronic hemoglobin dropped from 11-9.4 partly with hemodilution no signs of active bleeding monitor closely  DVT prophylaxis: Lovenox  code Status:DNR Family Communication:Discussed with son Disposition Plan: Pending physical therapy SNF Consults called: Ortho and cardiology  Estimated body mass index is 24.01 kg/m as calculated from the following:   Height as of this encounter: 6' (1.829 m).   Weight as of this encounter: 80.3 kg.    Subjective: Patient resting in bed complains of pain in the left lower extremity more with movement denies nausea vomiting chest pain shortness of breath.  Objective: Vitals:   04/17/19 0148 04/17/19 0546 04/17/19 0821 04/17/19 0950  BP: 108/71 115/67 117/69   Pulse: 79 71 73   Resp: 18 14 14    Temp: 98.6 F (37 C) 97.7 F (36.5 C)    TempSrc: Oral Oral    SpO2: 96% 94% 96% 97%  Weight:      Height:        Intake/Output Summary (Last 24 hours) at 04/17/2019 1105 Last data filed at 04/17/2019 0900 Gross per 24 hour  Intake 2218.35 ml  Output 625 ml  Net 1593.35 ml   Filed Weights   04/15/19 0903 04/16/19 1133  Weight: 80.3 kg 80.3 kg    Examination:  General exam: Appears calm and comfortable  Respiratory system: Clear to auscultation. Respiratory effort normal. Cardiovascular system: S1 & S2 heard, RRR. No JVD, murmurs, rubs, gallops or clicks. No pedal edema. Gastrointestinal system: Abdomen is nondistended, soft and nontender. No organomegaly or masses felt. Normal bowel sounds heard. Central nervous system: Alert and oriented. No focal neurological deficits. Extremities: No edema left lower extremity intact  sensation skin: No rashes, lesions or ulcers Psychiatry: Judgement and insight appear normal. Mood & affect appropriate.     Data Reviewed: I have personally reviewed following labs and imaging studies  CBC: Recent Labs  Lab 04/15/19 1218 04/16/19 0308 04/17/19 0304  WBC 11.1* 6.4 8.3  HGB 11.9* 9.9* 9.4*  HCT 37.6* 31.4* 30.2*  MCV 93.3 94.3 96.5  PLT 192 157 [id number]*   Basic Metabolic Panel: Recent Labs  Lab 04/15/19 1218 04/16/19 0308 04/17/19 0304  NA 137 136 136  K 3.7 3.8 4.0  CL 107 110 108  CO2 22 18* 20*  GLUCOSE 112* 93 108*  BUN 30* 26* 22  CREATININE 1.18 0.95 0.98  CALCIUM 9.4 8.5* 8.6*   GFR: Estimated Creatinine Clearance: 57.2 mL/min (by C-G formula based on SCr of 0.98 mg/dL). Liver Function Tests: Recent Labs  Lab 04/16/19 0308 04/17/19 0304  AST 17 17  ALT 15 15  ALKPHOS 35* 37*  BILITOT 1.7* 1.6*  PROT 5.2* 5.1*  ALBUMIN 3.1* 2.9*   No results for input(s): LIPASE, AMYLASE in the last 168 hours. No results for input(s): AMMONIA in the last 168 hours. Coagulation Profile: Recent Labs  Lab 04/15/19 1218 04/16/19 0308  INR 1.2 1.4*   Cardiac Enzymes: No results for input(s): CKTOTAL, CKMB, CKMBINDEX, TROPONINI in the last 168 hours. BNP (last 3 results) No results for input(s): PROBNP in the last 8760 hours. HbA1C: No results for input(s): HGBA1C in the last 72 hours. CBG: No results for input(s): GLUCAP in the last 168 hours. Lipid Profile: No results for input(s): CHOL, HDL, LDLCALC, TRIG, CHOLHDL, LDLDIRECT in the last 72 hours. Thyroid Function Tests: No results for input(s): TSH, T4TOTAL, FREET4, T3FREE, THYROIDAB in the last 72 hours. Anemia Panel: No results for input(s): VITAMINB12, FOLATE, FERRITIN, TIBC, IRON, RETICCTPCT in the last 72 hours. Sepsis Labs: No results for input(s): PROCALCITON, LATICACIDVEN in the last 168 hours.  Recent Results (from the past 240 hour(s))  SARS Coronavirus 2 [facility] order, Performed  in [facility] hospital lab) Nasopharyngeal Nasopharyngeal Swab     Status: None   Collection Time: 04/15/19  6:38 PM   Specimen: Nasopharyngeal Swab  Result Value Ref Range Status   SARS Coronavirus 2 NEGATIVE NEGATIVE Final    Comment: (NOTE) If result is NEGATIVE SARS-CoV-2 target nucleic acids are NOT DETECTED. The SARS-CoV-2 RNA is generally detectable in upper and lower  respiratory specimens during the acute phase of infection. The lowest  concentration of SARS-CoV-2 viral copies this assay can detect is 250  copies / mL. A negative result does not preclude SARS-CoV-2 infection  and should not be used as the sole basis for treatment or other  patient management decisions.  A negative result may occur with  improper specimen collection / handling, submission of specimen other  than nasopharyngeal swab, presence of viral mutation(s) within the  areas targeted by this assay, and inadequate number of viral copies  (<250 copies / mL). A negative result must be combined with clinical  observations, patient history, and epidemiological information. If result is POSITIVE SARS-CoV-2 target nucleic acids  are DETECTED. The SARS-CoV-2 RNA is generally detectable in upper and lower  respiratory specimens dur ing the acute phase of infection.  Positive  results are indicative of active infection with SARS-CoV-2.  Clinical  correlation with patient history and other diagnostic information is  necessary to determine patient infection status.  Positive results do  not rule out bacterial infection or co-infection with other viruses. If result is PRESUMPTIVE POSTIVE SARS-CoV-2 nucleic acids MAY BE PRESENT.   A presumptive positive result was obtained on the submitted specimen  and confirmed on repeat testing.  While 2019 novel coronavirus  (SARS-CoV-2) nucleic acids may be present in the submitted sample  additional confirmatory testing may be necessary for epidemiological  and / or clinical  management purposes  to differentiate between  SARS-CoV-2 and other Sarbecovirus currently known to infect humans.  If clinically indicated additional testing with an alternate test  methodology [phone]) is advised. The SARS-CoV-2 RNA is generally  detectable in upper and lower respiratory sp ecimens during the acute  phase of infection. The expected result is Negative. Fact Sheet for Patients:  StrictlyIdeas.no Fact Sheet for Healthcare Providers: BankingDealers.co.za This test is not yet approved or cleared by the [place] FDA and has been authorized for detection and/or diagnosis of SARS-CoV-2 by FDA under an Emergency Use Authorization (EUA).  This EUA will remain in effect (meaning this test can be used) for the duration of the COVID-19 declaration under Section 564(b)(1) of the Act, 21 U.S.C. section 360bbb-3(b)(1), unless the authorization is terminated or revoked sooner. Performed at [name], [place] [address], [place], [place] [address]   Surgical pcr screen     Status: None   Collection Time: 04/15/19  9:39 PM   Specimen: Nasal Mucosa; Nasal Swab  Result Value Ref Range Status   MRSA, PCR NEGATIVE NEGATIVE Final   Staphylococcus aureus NEGATIVE NEGATIVE Final    Comment: (NOTE) The Xpert SA Assay (FDA approved for NASAL specimens in patients 84 years of age and older), is one component of a comprehensive surveillance program. It is not intended to diagnose infection nor to guide or monitor treatment. Performed at [facility], [place] [address] [place], [place] [address]          Radiology Studies: Dg Chest 1 View  Result Date: 04/15/2019 CLINICAL DATA:  Preop.  Hypertension. EXAM: CHEST  1 VIEW COMPARISON:  01/01/2016 FINDINGS: Prior CABG. Mild cardiomegaly. No confluent opacities or effusions. No acute bony abnormality. IMPRESSION: No active disease. Electronically Signed    By: Rolm Baptise M.D.   On: 04/15/2019 18:14   Dg Knee 1-2 Views Left  Result Date: 04/15/2019 CLINICAL DATA:  Hip fracture, left leg pain EXAM: LEFT KNEE - 1-2 VIEW COMPARISON:  Lower extremity CT angiogram 12/31/2012 FINDINGS: No acute fracture. No malalignment. Joint spaces are relatively preserved. No joint effusion. Extensive vascular calcifications with focal enlargement of the calcification in the region of the popliteal artery measuring 3.6 cm in diameter suggesting a popliteal artery aneurysm. Comparison to CT angiogram 12/31/2012 demonstrated a 3.0 cm aneurysm at this site. IMPRESSION: 1. No acute osseous abnormality, left knee. 2. Extensive atherosclerosis with an enlarging popliteal artery aneurysm measuring approximately 3.6 cm in diameter. Electronically Signed   By: Davina Poke M.D.   On: 04/15/2019 19:12   Dg Knee 2 Views Right  Result Date: 04/15/2019 CLINICAL DATA:  Preop EXAM: RIGHT KNEE - 1-2 VIEW COMPARISON:  06/01/2015 FINDINGS: Screws and cerclage wires noted within the  patella. For mild degenerative changes in the right knee, best seen in the patellofemoral compartment with joint space narrowing and spurring. No fracture, subluxation or dislocation. Atherosclerotic calcifications within the femoral and popliteal arteries. There appears to be a popliteal artery aneurysm measuring approximately 3 cm above the right knee (there may be magnification, over estimating the size). IMPRESSION: Postoperative changes in the patella. No acute bony abnormality. Early degenerative changes. Vascular calcifications. Concern for popliteal artery aneurysm. This could be further evaluated with ultrasound or CT if felt clinically indicated. Electronically Signed   By: Rolm Baptise M.D.   On: 04/15/2019 18:24   Pelvis Portable  Result Date: 04/16/2019 CLINICAL DATA:  85 year old male status post open reduction internal fixation EXAM: PORTABLE PELVIS 1-2 VIEWS COMPARISON:  April 15, 2019  FINDINGS: Osteopenia. Interval surgical changes of open reduction internal fixation of left hip with antegrade femoral rod, gamma nail screw fixation and distal interlocking screw. Gas within the surgical bed. Dense calcifications of the vasculature. Surgical clips of the left proximal thigh and overlying the pelvis. IMPRESSION: Early surgical changes of left hip ORIF, with no complicating features. Osteopenia. Atherosclerosis Electronically Signed   By: Corrie Mckusick D.O.   On: 04/16/2019 15:16   Mr Hip Left Wo Contrast  Result Date: 04/15/2019 CLINICAL DATA:  Left hip pain secondary to a fall last night. EXAM: MR OF THE LEFT HIP WITHOUT CONTRAST TECHNIQUE: Multiplanar, multisequence MR imaging was performed. No intravenous contrast was administered. COMPARISON:  Radiographs dated 04/15/2019 and CT scan of the abdomen and pelvis dated 05/04/2018 FINDINGS: Bones: There is an incomplete nondisplaced intertrochanteric fracture of the proximal left femur extending from the tip of the left greater trochanter approximately 60% of the way through the intertrochanteric region. The other bones of the pelvis are intact. Articular cartilage and labrum Articular cartilage: Slight thinning of the articular cartilage of the posterior aspect of the joint. Labrum:  Intact. Joint or bursal effusion Joint effusion:  No joint effusion. Bursae: No bursitis. Muscles and tendons Muscles and tendons: There is a 5 x 5 x 3 cm intramuscular hematoma in the left gluteus maximus muscle posterior to the left greater trochanter. There is extensive edema in the gluteus maximus muscle. There is also edema in the left quadratus femoris muscle as well as slight edema along the left iliopsoas tendon. Other findings Miscellaneous: The patient has chronic massive internal iliac artery aneurysms, 8.6 cm on the right and 7.3 cm on the left. There appears to be blood flow in portions of each of these large aneurysms. These were demonstrated on the  prior CT scan of the abdomen and pelvis dated 05/04/2018. IMPRESSION: 1. Incomplete nondisplaced intertrochanteric fracture of the proximal left femur. 2. Strains of the left gluteus maximus, left quadratus femoris, and left iliopsoas muscles. Intramuscular hematoma of the left gluteus maximus muscle 3. Chronic massive internal iliac artery aneurysms. Electronically Signed   By: Lorriane Shire M.D.   On: 04/15/2019 15:57   Dg C-arm 1-60 Min-no Report  Result Date: 04/16/2019 CLINICAL DATA:  Left-sided intramedullary nail placement. EXAM: OPERATIVE LEFT HIP (WITH PELVIS IF PERFORMED) 2 VIEWS TECHNIQUE: Fluoroscopic spot image(s) were submitted for interpretation post-operatively. COMPARISON:  04/15/2019 FINDINGS: Examination demonstrates intramedullary nail placement over the left proximal femur with associated screw bridging the femoral neck into the femoral head as hardware is intact. Hardware bridges patient's intertrochanteric fracture as there is normal alignment over the fracture site. Mild degenerate change of the left hip. IMPRESSION: Expected changes post fixation of  intertrochanteric left femoral fracture. Electronically Signed   By: Marin Olp M.D.   On: 04/16/2019 14:12   Dg Hip Operative Unilat W Or W/o Pelvis Left  Result Date: 04/16/2019 CLINICAL DATA:  Left-sided intramedullary nail placement. EXAM: OPERATIVE LEFT HIP (WITH PELVIS IF PERFORMED) 2 VIEWS TECHNIQUE: Fluoroscopic spot image(s) were submitted for interpretation post-operatively. COMPARISON:  04/15/2019 FINDINGS: Examination demonstrates intramedullary nail placement over the left proximal femur with associated screw bridging the femoral neck into the femoral head as hardware is intact. Hardware bridges patient's intertrochanteric fracture as there is normal alignment over the fracture site. Mild degenerate change of the left hip. IMPRESSION: Expected changes post fixation of intertrochanteric left femoral fracture.  Electronically Signed   By: Marin Olp M.D.   On: 04/16/2019 14:12        Scheduled Meds:  docusate sodium  100 mg Oral BID   enoxaparin (LOVENOX) injection  40 mg Subcutaneous Q24H   fenofibrate  160 mg Oral Daily   loratadine  10 mg Oral Daily   polyvinyl alcohol  1-2 drop Both Eyes QHS   senna  1 tablet Oral BID   Continuous Infusions:   LOS: 2 days     Georgette Shell, MD [facility]  If 7PM-7AM, please contact night-coverage [email or web address] Password [email or web address] 04/17/2019, 11:05 AM

## 2019-04-17 NOTE — Plan of Care (Signed)
Plan of care reviewed and discussed with the patient. 

## 2019-04-17 NOTE — Anesthesia Postprocedure Evaluation (Signed)
Anesthesia Post Note  Patient: Jesse [name]  Procedure(s) Performed: INTRAMEDULLARY (IM) NAIL INTERTROCHANTRIC (Left Leg Upper)     Patient location during evaluation: PACU Anesthesia Type: General Level of consciousness: awake and alert Pain management: pain level controlled Vital Signs Assessment: post-procedure vital signs reviewed and stable Respiratory status: spontaneous breathing, nonlabored ventilation and respiratory function stable Cardiovascular status: blood pressure returned to baseline and stable Postop Assessment: no apparent nausea or vomiting Anesthetic complications: no    Last Vitals:  Vitals:   04/17/19 0148 04/17/19 0546  BP: 108/71 115/67  Pulse: 79 71  Resp: 18 14  Temp: 37 C 36.5 C  SpO2: 96% 94%    Last Pain:  Vitals:   04/17/19 0546  TempSrc: Oral  PainSc:    Pain Goal: Patients Stated Pain Goal: 3 (04/17/19 0050)                 Lynda Rainwater

## 2019-04-18 LAB — BASIC METABOLIC PANEL
Anion gap: 7 (ref 5–15)
BUN: 22 mg/dL (ref 8–23)
CO2: 21 mmol/L — ABNORMAL LOW (ref 22–32)
Calcium: 8.7 mg/dL — ABNORMAL LOW (ref 8.9–10.3)
Chloride: 105 mmol/L (ref 98–111)
Creatinine, Ser: 0.83 mg/dL (ref 0.61–1.24)
GFR calc Af Amer: >60 mL/min (ref >60)
GFR calc non Af Amer: >60 mL/min (ref >60)
Glucose, Bld: 100 mg/dL — ABNORMAL HIGH (ref 70–99)
Potassium: 4 mmol/L (ref 3.5–5.1)
Sodium: 133 mmol/L — ABNORMAL LOW (ref 135–145)

## 2019-04-18 LAB — CBC
HCT: 29.5 % — ABNORMAL LOW (ref 39.0–52.0)
Hemoglobin: 9.3 g/dL — ABNORMAL LOW (ref 13.0–17.0)
MCH: 29.9 pg (ref 26.0–34.0)
MCHC: 31.5 g/dL (ref 30.0–36.0)
MCV: 94.9 fL (ref 80.0–100.0)
Platelets: 157 10*3/uL (ref 150–400)
RBC: 3.11 MIL/uL — ABNORMAL LOW (ref 4.22–5.81)
RDW: 13.2 % (ref 11.5–15.5)
WBC: 7.4 10*3/uL (ref 4.0–10.5)
nRBC: 0 % (ref 0.0–0.2)

## 2019-04-18 MED ORDER — OXYCODONE HCL 5 MG PO TABS
5.0000 mg | ORAL_TABLET | Freq: Four times a day (QID) | ORAL | Status: DC | PRN
  Administered 2019-04-18 – 2019-04-20 (×6): 5 mg via ORAL
  Filled 2019-04-18 (×7): qty 1

## 2019-04-18 NOTE — Progress Notes (Signed)
    Subjective:  Patient reports pain as mild to moderate.  Denies N/V/CP/SOB. Ambulated in room.  Objective:   VITALS:   Vitals:   04/17/19 1703 04/17/19 1714 04/17/19 2158 04/18/19 0456  BP: (!) 88/58 109/65 136/89 109/62  Pulse: 81 81 72 80  Resp: 16 16 14 16   Temp: 98.2 F (36.8 C)  98.3 F (36.8 C) 99.1 F (37.3 C)  TempSrc:   Oral Oral  SpO2: 93% 91% 91% 92%  Weight:      Height:        NAD ABD soft Sensation intact distally Intact pulses distally Dorsiflexion/Plantar flexion intact Incision: dressing C/D/I Compartment soft   Lab Results  Component Value Date   WBC 7.4 04/18/2019   HGB 9.3 (L) 04/18/2019   HCT 29.5 (L) 04/18/2019   MCV 94.9 04/18/2019   PLT 157 04/18/2019   BMET    Component Value Date/Time   NA 133 (L) 04/18/2019 0318   NA 141 03/02/2017 0753   K 4.0 04/18/2019 0318   CL 105 04/18/2019 0318   CO2 21 (L) 04/18/2019 0318   GLUCOSE 100 (H) 04/18/2019 0318   GLUCOSE 100 (H) 05/05/2006 0839   BUN 22 04/18/2019 0318   BUN 26 03/02/2017 0753   CREATININE 0.83 04/18/2019 0318   CREATININE 0.99 03/10/2014 1445   CALCIUM 8.7 (L) 04/18/2019 0318   GFRNONAA >60 04/18/2019 0318   GFRAA >60 04/18/2019 0318     Assessment/Plan: 2 Days Post-Op   Active Problems:   Hip fracture ([place])   WBAT with walker DVT ppx: Lovenox, SCDs, TEDS PO pain control PT/OT Dispo: D/C planning   Hilton Cork Raman [place] 04/18/2019, 10:00 AM   Rod Can, MD Cell: [phone] [place] [facility]  [place] [address], [address], [place], [place] [address] Phone: [phone] [email or web address] Facebook  [employer]

## 2019-04-18 NOTE — TOC Initial Note (Signed)
Transition of Care [facility]) - Initial/Assessment Note    Patient Details  Name: Jesse [name] MRN: [id number] Date of Birth: 09/04/1927  Transition of Care [facility]) CM/SW Contact:    Leeroy Cha, RN Phone Number: 04/18/2019, 8:55 AM  Clinical Narrative:                 Patient wants to go to [facility], [place] faxed to facility for review.  Expected Discharge Plan: [place] Barriers to Discharge: Continued Medical Work up   Patient Goals and CMS Choice Patient states their goals for this hospitalization and ongoing recovery are:: wants pt to get back to his baseline of independence, driving, walking and taking care of himsel at home CMS Medicare.gov Compare Post Acute Care list provided to:: Patient Represenative (must comment)(son, [facility], [facility]) Choice offered to / list presented to : Adult Children  Expected Discharge Plan and Services Expected Discharge Plan: Skilled Nursing Facility In-house Referral: Clinical Social Work Discharge Planning Services: CM Consult Post Acute Care Choice: [place] Living arrangements for the past 2 months: [place] Expected Discharge Date: 04/18/19                                    Prior Living Arrangements/Services Living arrangements for the past 2 months: Single Family Home Lives with:: Self Patient language and need for interpreter reviewed:: Yes Do you feel safe going back to the place where you live?: Yes        Care giver support system in place?: Yes (comment) Current home services: DME(rolling walker) Criminal Activity/Legal Involvement Pertinent to Current Situation/Hospitalization: No - Comment as needed  Activities of Daily Living Home Assistive Devices/Equipment: Eyeglasses ADL Screening (condition at time of admission) Patient's cognitive ability adequate to safely complete daily activities?: Yes Is the patient deaf or have difficulty hearing?: Yes Does the  patient have difficulty seeing, even when wearing glasses/contacts?: No Does the patient have difficulty concentrating, remembering, or making decisions?: No Patient able to express need for assistance with ADLs?: Yes Does the patient have difficulty dressing or bathing?: No Independently performs ADLs?: Yes (appropriate for developmental age) Does the patient have difficulty walking or climbing stairs?: Yes Weakness of Legs: Left Weakness of Arms/Hands: None  Permission Sought/Granted Permission sought to share information with : Case Manager, PCP, Family Supports, Customer service manager Permission granted to share information with : Yes, Verbal Permission Granted  Share Information with NAME: Badr Crisantos  Permission granted to share info w AGENCY: [facility], SN[facility]  Permission granted to share info w Relationship: son  Permission granted to share info w Contact Information: [phone]  Emotional Assessment           Psych Involvement: No (comment)  Admission diagnosis:  Fall [W19.XXXA] Patient Active Problem List   Diagnosis Date Noted  . Hip fracture ([place]) 04/15/2019  . Fall   . Coronary artery disease involving native coronary artery of native heart without angina pectoris 11/20/2018  . Thoracic aortic aneurysm without rupture ([place]) 11/20/2018  . Chronic systolic heart failure ([place]) 11/20/2018  . Duodenal ulcer with hemorrhage 05/05/2018  . Hemorrhagic shock ([place]) 05/05/2018  . AKI (acute kidney injury) ([place]) 05/05/2018  . Orthostatic hypotension 05/04/2018  . Symptomatic anemia 05/04/2018  . Melena 05/04/2018  . GI bleed 05/04/2018  . Patella fracture 06/09/2015  . Aftercare following surgery of the circulatory system, [place] 01/08/2013  .  Pain in limb- Left popliteal 12/25/2012  . 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 Aneurysm of abdominal vessel ([place]) 02/21/2012  . Aneurysm of iliac artery ([facility])  08/09/2011  . Aneurysm of artery of lower extremity ([place]) 08/09/2011  . [place], [place] 01/16/2009  . Essential hypertension 01/16/2009  . CAD, ARTERY BYPASS GRAFT 01/16/2009  . PERIPHERAL VASCULAR DISEASE 01/16/2009  . ABDOMINAL AORTIC ANEURYSM REPAIR, HX OF 01/16/2009  . MIXED HYPERLIPIDEMIA 12/28/2008   PCP:  [name], MD Pharmacy:   [facility] [place], [place] [address] Phone: [phone] Fax: [phone]  [facility] [place], [place] [address] Phone: [phone] Fax: [phone]  [facility] [place], [place] [address] Phone: [phone] Fax: [phone]     Social Determinants of Health (SDOH) Interventions    Readmission Risk Interventions No flowsheet data found.

## 2019-04-18 NOTE — Care Management Important Message (Deleted)
Important Message  Patient Details IM Letter given to Velva Harman RN to present to the Patient Name: Jesse [name] MRN: [id number] Date of Birth: 04/22/1928   Medicare Important Message Given:  Yes     Kerin Salen 04/18/2019, 12:35 PM

## 2019-04-18 NOTE — Plan of Care (Signed)
  Problem: Health Behavior/Discharge Planning: Goal: Ability to manage health-related needs will improve Outcome: Progressing   Problem: Clinical Measurements: Goal: Ability to maintain clinical measurements within normal limits will improve Outcome: Progressing Goal: Will remain free from infection Outcome: Progressing Goal: Diagnostic test results will improve Outcome: Progressing Goal: Respiratory complications will improve Outcome: Progressing Goal: Cardiovascular complication will be avoided Outcome: Progressing   Problem: Activity: Goal: Risk for activity intolerance will decrease Outcome: Progressing   Problem: Elimination: Goal: Will not experience complications related to bowel motility Outcome: Progressing   Problem: Pain Managment: Goal: General experience of comfort will improve Outcome: Progressing   Problem: Safety: Goal: Ability to remain free from injury will improve Outcome: Progressing   Problem: Skin Integrity: Goal: Risk for impaired skin integrity will decrease Outcome: Progressing   Problem: Education: Goal: Verbalization of understanding the information provided (i.e., activity precautions, restrictions, etc) will improve Outcome: Progressing   Problem: Activity: Goal: Ability to ambulate and perform ADLs will improve Outcome: Progressing   Problem: Clinical Measurements: Goal: Postoperative complications will be avoided or minimized Outcome: Progressing   Problem: Self-Concept: Goal: Ability to maintain and perform role responsibilities to the fullest extent possible will improve Outcome: Progressing   Problem: Pain Management: Goal: Pain level will decrease Outcome: Progressing

## 2019-04-18 NOTE — Care Management Important Message (Signed)
Important Message  Patient Details IM Letter given to Velva Harman RN to present to the Patient Name: Jesse [name] MRN: [id number] Date of Birth: Nov 30, 1927   Medicare Important Message Given:  Yes     Kerin Salen 04/18/2019, 12:21 PM

## 2019-04-18 NOTE — NC FL2 (Addendum)
[place] LEVEL OF CARE SCREENING TOOL     IDENTIFICATION  Patient Name: Jesse [name] Birthdate: Nov 03, 1927 Sex: male Admission Date (Current Location): 04/15/2019  [facility] and [place] Number:  Herbalist and Address:         Provider Number: [id number]  Attending Physician Name and Address:  Georgette Shell, MD  Relative Name and Phone Number:       Current Level of Care: Hospital Recommended Level of Care: [place] Prior Approval Number:    Date Approved/Denied:   PASRR Number: [id number]  Discharge Plan: SNF    Current Diagnoses: Patient Active Problem List   Diagnosis Date Noted  . Hip fracture ([place]) 04/15/2019  . Fall   . Coronary artery disease involving native coronary artery of native heart without angina pectoris 11/20/2018  . Thoracic aortic aneurysm without rupture ([place]) 11/20/2018  . Chronic systolic heart failure ([place]) 11/20/2018  . Duodenal ulcer with hemorrhage 05/05/2018  . Hemorrhagic shock ([place]) 05/05/2018  . AKI (acute kidney injury) ([place]) 05/05/2018  . Orthostatic hypotension 05/04/2018  . Symptomatic anemia 05/04/2018  . Melena 05/04/2018  . GI bleed 05/04/2018  . Patella fracture 06/09/2015  . Aftercare following surgery of the circulatory system, [place] 01/08/2013  . Pain in limb- Left popliteal 12/25/2012  . Aneurysm artery, femoral ([place]) 05/22/2012  . 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 Aneurysm of abdominal vessel ([place]) 02/21/2012  . Aneurysm of iliac artery (H[facility]) 08/09/2011  . Aneurysm of artery of lower extremity ([place]) 08/09/2011  . [place], [place] 01/16/2009  . Essential hypertension 01/16/2009  . CAD, ARTERY BYPASS GRAFT 01/16/2009  . PERIPHERAL VASCULAR DISEASE 01/16/2009  . ABDOMINAL AORTIC ANEURYSM REPAIR, HX OF 01/16/2009  . MIXED HYPERLIPIDEMIA 12/28/2008    Orientation RESPIRATION BLADDER Height & Weight     Self, Time, Situation, Place  Normal Continent  Weight: 80.3 kg Height:  6' (182.9 cm)  BEHAVIORAL SYMPTOMS/MOOD NEUROLOGICAL BOWEL NUTRITION STATUS      Continent Diet(regular)  AMBULATORY STATUS COMMUNICATION OF NEEDS Skin   Extensive Assist Verbally Normal                       Personal Care Assistance Level of Assistance  Bathing, Feeding, Dressing Bathing Assistance: Limited assistance Feeding assistance: Limited assistance Dressing Assistance: Limited assistance     Functional Limitations Info  Sight Sight Info: Adequate        SPECIAL CARE FACTORS FREQUENCY                       Contractures Contractures Info: Not present    Additional Factors Info  Code Status Code Status Info: DNR             Current Medications (04/18/2019):  This is the current hospital active medication list Current Facility-Administered Medications  Medication Dose Route Frequency Provider Last Rate Last Dose  . aspirin EC tablet 81 mg  81 mg Oral Daily Georgette Shell, MD   81 mg at 04/17/19 1319  . docusate sodium (COLACE) capsule 100 mg  100 mg Oral BID Rod Can, MD   100 mg at 04/17/19 2239  . enoxaparin (LOVENOX) injection 40 mg  40 mg Subcutaneous Q24H Rod Can, MD   40 mg at 04/18/19 [id number]  . fenofibrate tablet 160 mg  160 mg Oral Daily Rod Can, MD   160 mg at 04/17/19 0825  . loratadine (CLARITIN) tablet 10 mg  10  mg Oral Daily Rod Can, MD   10 mg at 04/17/19 0826  . menthol-cetylpyridinium (CEPACOL) lozenge 3 mg  1 lozenge Oral PRN Swinteck, Aaron Edelman, MD       Or  . phenol (CHLORASEPTIC) mouth spray 1 spray  1 spray Mouth/Throat PRN Swinteck, Aaron Edelman, MD      . methocarbamol (ROBAXIN) tablet 500 mg  500 mg Oral Q6H PRN Schorr, Rhetta Mura, NP   500 mg at 04/17/19 2238  . metoCLOPramide (REGLAN) tablet 5-10 mg  5-10 mg Oral Q8H PRN Swinteck, Aaron Edelman, MD       Or  . metoCLOPramide (REGLAN) injection 5-10 mg  5-10 mg Intravenous Q8H PRN Swinteck, Aaron Edelman, MD      . morphine 2 MG/ML injection  0.5 mg  0.5 mg Intravenous Q4H PRN Rod Can, MD   0.5 mg at 04/17/19 0050  . ondansetron (ZOFRAN) tablet 4 mg  4 mg Oral Q6H PRN Swinteck, Aaron Edelman, MD       Or  . ondansetron (ZOFRAN) injection 4 mg  4 mg Intravenous Q6H PRN Swinteck, Aaron Edelman, MD      . polyvinyl alcohol (LIQUIFILM TEARS) 1.4 % ophthalmic solution 1-2 drop  1-2 drop Both Eyes QHS Georgette Shell, MD      . senna [facility]) tablet 8.6 mg  1 tablet Oral BID Rod Can, MD   8.6 mg at 04/17/19 [id number]     Discharge Medications: Please see discharge summary for a list of discharge medications.  Relevant Imaging Results:  Relevant Lab Results:   Additional Information  ssn: [id number]  Leeroy Cha, RN

## 2019-04-18 NOTE — Progress Notes (Signed)
PROGRESS NOTE    Jesse [name]  [id number] DOB: Sep 07, 1927 DOA: 04/15/2019 PCP: [name], MD  Brief Narrative: 85 y.o.malewith medical history significant ofCAD, hypertension, MI, right bundle branch block, AAA, prostate cancer, came in with complaints of left hip pain after falling last night. Patient does not know how he fell however he thinks that he is slipped on his home shoes or his feet got caught on his home shoes and he slipped and fell. According to his son he always walks dragging his feet which is normal for him. He denies any chest pain shortness of breath nausea vomiting diarrhea. He denies any fever chills cough. At baseline he stays home watches TV all night and sleeps all day. He sees Dr. early for vascular issues and aneurysms. He denies any urinary complaints.  ED Course:Blood pressure 96/63pulse is 7595% on room air. Sodium 137 potassium 3.7 BUN 30 creatinine 1.18 white count 11.1 hemoglobin 11.9 platelet count 192. MRI of the hip shows incomplete nondisplaced intertrochanteric fracture of the proximal left femur intramuscular hematoma of the left gluteus maximus muscle. COVID-19 pending. Son reports that he does not leave his house and go anywhere. He has not had any sick contacts    Assessment & Plan:   Active Problems:   Hip fracture (HCC)   #1 left hip fracture status post fall deniesLOC.Denies any chest pain shortness of breath nausea vomiting diarrhea.  He is status post intramedullary fixation of the left femur.  His activity has been increased to weightbearing as tolerated today.PT has recommended SNF as patient not able to walk alone safely needs 24 hours supervision and assistance with mobility and transfers.he was mobilizing with out any assistance prior to admit.he lives alone.he cannot walk properly due to recent surgery and cannot go home and PT recommends SNF..  #2 history of CAD and CABG in the 1992-last echo I have is from  05/2018 with ejection fraction 40 to 45%.  Continue beta-blocker.Appreciate cardiology input.  Restart baby aspirin.  #3 dehydration patient received IV fluids  #4 history of hypertension  RESTART home meds #5 systolic heart failure with ejection fraction 40 to 45%-stable not on any diuretics at home  #6 diffuse aneurysmal disease with iliac aneurysm popliteal artery and some thoracic aortic aneurysm followed by Dr. Donnetta Hutching stable  #7 history of upper GI bleed with duodenal ulcer I will put him on PPI  #8 anemia chronic hemoglobin dropped from 11-9.4 partly with hemodilution no signs of active bleeding monitor closely  DVT prophylaxis: Lovenox  code Status:DNR Family Communication:Discussed with son Disposition Plan: pending snf Consults called:Ortho and cardiology    Estimated body mass index is 24.01 kg/m as calculated from the following:   Height as of this encounter: 6' (1.829 m).   Weight as of this encounter: 80.3 kg.   Subjective:  Had a temp 100.5 overnight afebrile this am sweated a lot.no nausea vomiting diarhea urinary c/o or cough  Objective: Vitals:   04/17/19 1714 04/17/19 2158 04/18/19 0456 04/18/19 1042  BP: 109/65 136/89 109/62 (!) 147/96  Pulse: 81 72 80 78  Resp: 16 14 16 17   Temp:  98.3 F (36.8 C) 99.1 F (37.3 C) 98.5 F (36.9 C)  TempSrc:  Oral Oral   SpO2: 91% 91% 92% 91%  Weight:      Height:        Intake/Output Summary (Last 24 hours) at 04/18/2019 1334 Last data filed at 04/18/2019 1000 Gross per 24 hour  Intake 2080 ml  Output 670 ml  Net 1410 ml   Filed Weights   04/15/19 0903 04/16/19 1133  Weight: 80.3 kg 80.3 kg    Examination:  General exam: Appears calm and comfortable  Respiratory system: Clear to auscultation. Respiratory effort normal. Cardiovascular system: S1 & S2 heard, RRR. No JVD, murmurs, rubs, gallops or clicks. No pedal edema. Gastrointestinal system: Abdomen is nondistended, soft and nontender. No  organomegaly or masses felt. Normal bowel sounds heard. Central nervous system: Alert and oriented. No focal neurological deficits. Extremities: left hip dressings on no drainage  Skin: No rashes, lesions or ulcers Psychiatry: Judgement and insight appear normal. Mood & affect appropriate.     Data Reviewed: I have personally reviewed following labs and imaging studies  CBC: Recent Labs  Lab 04/15/19 1218 04/16/19 0308 04/17/19 0304 04/18/19 0318  WBC 11.1* 6.4 8.3 7.4  HGB 11.9* 9.9* 9.4* 9.3*  HCT 37.6* 31.4* 30.2* 29.5*  MCV 93.3 94.3 96.5 94.9  PLT 192 157 144* [id number]   Basic Metabolic Panel: Recent Labs  Lab 04/15/19 1218 04/16/19 0308 04/17/19 0304 04/18/19 0318  NA 137 136 136 133*  K 3.7 3.8 4.0 4.0  CL 107 110 108 105  CO2 22 18* 20* 21*  GLUCOSE 112* 93 108* 100*  BUN 30* 26* 22 22  CREATININE 1.18 0.95 0.98 0.83  CALCIUM 9.4 8.5* 8.6* 8.7*   GFR: Estimated Creatinine Clearance: 67.5 mL/min (by C-G formula based on SCr of 0.83 mg/dL). Liver Function Tests: Recent Labs  Lab 04/16/19 0308 04/17/19 0304  AST 17 17  ALT 15 15  ALKPHOS 35* 37*  BILITOT 1.7* 1.6*  PROT 5.2* 5.1*  ALBUMIN 3.1* 2.9*   No results for input(s): LIPASE, AMYLASE in the last 168 hours. No results for input(s): AMMONIA in the last 168 hours. Coagulation Profile: Recent Labs  Lab 04/15/19 1218 04/16/19 0308  INR 1.2 1.4*   Cardiac Enzymes: No results for input(s): CKTOTAL, CKMB, CKMBINDEX, TROPONINI in the last 168 hours. BNP (last 3 results) No results for input(s): PROBNP in the last 8760 hours. HbA1C: No results for input(s): HGBA1C in the last 72 hours. CBG: No results for input(s): GLUCAP in the last 168 hours. Lipid Profile: No results for input(s): CHOL, HDL, LDLCALC, TRIG, CHOLHDL, LDLDIRECT in the last 72 hours. Thyroid Function Tests: No results for input(s): TSH, T4TOTAL, FREET4, T3FREE, THYROIDAB in the last 72 hours. Anemia Panel: No results for  input(s): VITAMINB12, FOLATE, FERRITIN, TIBC, IRON, RETICCTPCT in the last 72 hours. Sepsis Labs: No results for input(s): PROCALCITON, LATICACIDVEN in the last 168 hours.  Recent Results (from the past 240 hour(s))  SARS Coronavirus 2 [facility] order, Performed in [facility] hospital lab) Nasopharyngeal Nasopharyngeal Swab     Status: None   Collection Time: 04/15/19  6:38 PM   Specimen: Nasopharyngeal Swab  Result Value Ref Range Status   SARS Coronavirus 2 NEGATIVE NEGATIVE Final    Comment: (NOTE) If result is NEGATIVE SARS-CoV-2 target nucleic acids are NOT DETECTED. The SARS-CoV-2 RNA is generally detectable in upper and lower  respiratory specimens during the acute phase of infection. The lowest  concentration of SARS-CoV-2 viral copies this assay can detect is 250  copies / mL. A negative result does not preclude SARS-CoV-2 infection  and should not be used as the sole basis for treatment or other  patient management decisions.  A negative result may occur with  improper specimen collection / handling, submission of specimen other  than nasopharyngeal swab, presence  of viral mutation(s) within the  areas targeted by this assay, and inadequate number of viral copies  (<250 copies / mL). A negative result must be combined with clinical  observations, patient history, and epidemiological information. If result is POSITIVE SARS-CoV-2 target nucleic acids are DETECTED. The SARS-CoV-2 RNA is generally detectable in upper and lower  respiratory specimens dur ing the acute phase of infection.  Positive  results are indicative of active infection with SARS-CoV-2.  Clinical  correlation with patient history and other diagnostic information is  necessary to determine patient infection status.  Positive results do  not rule out bacterial infection or co-infection with other viruses. If result is PRESUMPTIVE POSTIVE SARS-CoV-2 nucleic acids MAY BE PRESENT.   A presumptive positive  result was obtained on the submitted specimen  and confirmed on repeat testing.  While 2019 novel coronavirus  (SARS-CoV-2) nucleic acids may be present in the submitted sample  additional confirmatory testing may be necessary for epidemiological  and / or clinical management purposes  to differentiate between  SARS-CoV-2 and other Sarbecovirus currently known to infect humans.  If clinically indicated additional testing with an alternate test  methodology [phone]) is advised. The SARS-CoV-2 RNA is generally  detectable in upper and lower respiratory sp ecimens during the acute  phase of infection. The expected result is Negative. Fact Sheet for Patients:  StrictlyIdeas.no Fact Sheet for Healthcare Providers: BankingDealers.co.za This test is not yet approved or cleared by the [place] FDA and has been authorized for detection and/or diagnosis of SARS-CoV-2 by FDA under an Emergency Use Authorization (EUA).  This EUA will remain in effect (meaning this test can be used) for the duration of the COVID-19 declaration under Section 564(b)(1) of the Act, 21 U.S.C. section 360bbb-3(b)(1), unless the authorization is terminated or revoked sooner. Performed at [facility], [place] [address], [place], [place] [address]   Surgical pcr screen     Status: None   Collection Time: 04/15/19  9:39 PM   Specimen: Nasal Mucosa; Nasal Swab  Result Value Ref Range Status   MRSA, PCR NEGATIVE NEGATIVE Final   Staphylococcus aureus NEGATIVE NEGATIVE Final    Comment: (NOTE) The Xpert SA Assay (FDA approved for NASAL specimens in patients 5 years of age and older), is one component of a comprehensive surveillance program. It is not intended to diagnose infection nor to guide or monitor treatment. Performed at [facility], [place] [address] [place], [place] [address]          Radiology Studies: Pelvis  Portable  Result Date: 04/16/2019 CLINICAL DATA:  85 year old male status post open reduction internal fixation EXAM: PORTABLE PELVIS 1-2 VIEWS COMPARISON:  April 15, 2019 FINDINGS: Osteopenia. Interval surgical changes of open reduction internal fixation of left hip with antegrade femoral rod, gamma nail screw fixation and distal interlocking screw. Gas within the surgical bed. Dense calcifications of the vasculature. Surgical clips of the left proximal thigh and overlying the pelvis. IMPRESSION: Early surgical changes of left hip ORIF, with no complicating features. Osteopenia. Atherosclerosis Electronically Signed   By: Corrie Mckusick D.O.   On: 04/16/2019 15:16   Dg C-arm 1-60 Min-no Report  Result Date: 04/16/2019 CLINICAL DATA:  Left-sided intramedullary nail placement. EXAM: OPERATIVE LEFT HIP (WITH PELVIS IF PERFORMED) 2 VIEWS TECHNIQUE: Fluoroscopic spot image(s) were submitted for interpretation post-operatively. COMPARISON:  04/15/2019 FINDINGS: Examination demonstrates intramedullary nail placement over the left proximal femur with associated screw bridging the femoral neck into the femoral head  as hardware is intact. Hardware bridges patient's intertrochanteric fracture as there is normal alignment over the fracture site. Mild degenerate change of the left hip. IMPRESSION: Expected changes post fixation of intertrochanteric left femoral fracture. Electronically Signed   By: Marin Olp M.D.   On: 04/16/2019 14:12   Dg Hip Operative Unilat W Or W/o Pelvis Left  Result Date: 04/16/2019 CLINICAL DATA:  Left-sided intramedullary nail placement. EXAM: OPERATIVE LEFT HIP (WITH PELVIS IF PERFORMED) 2 VIEWS TECHNIQUE: Fluoroscopic spot image(s) were submitted for interpretation post-operatively. COMPARISON:  04/15/2019 FINDINGS: Examination demonstrates intramedullary nail placement over the left proximal femur with associated screw bridging the femoral neck into the femoral head as hardware is  intact. Hardware bridges patient's intertrochanteric fracture as there is normal alignment over the fracture site. Mild degenerate change of the left hip. IMPRESSION: Expected changes post fixation of intertrochanteric left femoral fracture. Electronically Signed   By: Marin Olp M.D.   On: 04/16/2019 14:12        Scheduled Meds: . aspirin EC  81 mg Oral Daily  . docusate sodium  100 mg Oral BID  . enoxaparin (LOVENOX) injection  40 mg Subcutaneous Q24H  . fenofibrate  160 mg Oral Daily  . loratadine  10 mg Oral Daily  . polyvinyl alcohol  1-2 drop Both Eyes QHS  . senna  1 tablet Oral BID   Continuous Infusions:   LOS: 3 days     Georgette Shell, MD [facility]  If 7PM-7AM, please contact night-coverage [email or web address] Password [email or web address] 04/18/2019, 1:34 PM

## 2019-04-18 NOTE — TOC Progression Note (Addendum)
Transition of Care [facility]) - Progression Note    Patient Details  Name: Jesse [name] MRN: [id number] Date of Birth: October 04, 1927  [facility]) CM/SW Contact  Leeroy Cha, RN Phone Number: 04/18/2019, 12:28 PM  Clinical Narrative:    Therapy notes sent to [facility] to alert that patient prefers to go to [facility]. Patient accepted by [facility].  Expected Discharge Plan: [place] Barriers to Discharge: Continued Medical Work up  Expected Discharge Plan and Services Expected Discharge Plan: [place] In-house Referral: Clinical Social Work Discharge Planning Services: CM Consult Post Acute Care Choice: [place] arrangements for the past 2 months: Single Family Home Expected Discharge Date: 04/18/19                                     Social Determinants of Health (SDOH) Interventions    Readmission Risk Interventions No flowsheet data found.

## 2019-04-19 DIAGNOSIS — Z419 Encounter for procedure for purposes other than remedying health state, unspecified: Secondary | ICD-10-CM

## 2019-04-19 DIAGNOSIS — Z7189 Other specified counseling: Secondary | ICD-10-CM

## 2019-04-19 LAB — CBC
HCT: 30.6 % — ABNORMAL LOW (ref 39.0–52.0)
Hemoglobin: 9.7 g/dL — ABNORMAL LOW (ref 13.0–17.0)
MCH: 29.5 pg (ref 26.0–34.0)
MCHC: 31.7 g/dL (ref 30.0–36.0)
MCV: 93 fL (ref 80.0–100.0)
Platelets: 186 10*3/uL (ref 150–400)
RBC: 3.29 MIL/uL — ABNORMAL LOW (ref 4.22–5.81)
RDW: 12.9 % (ref 11.5–15.5)
WBC: 7.8 10*3/uL (ref 4.0–10.5)
nRBC: 0 % (ref 0.0–0.2)

## 2019-04-19 LAB — SARS CORONAVIRUS 2 BY RT PCR (HOSPITAL ORDER, PERFORMED IN ~~LOC~~ HOSPITAL LAB): SARS Coronavirus 2: NEGATIVE

## 2019-04-19 MED ORDER — POLYETHYLENE GLYCOL 3350 17 G PO PACK
17.0000 g | PACK | Freq: Every day | ORAL | Status: DC
  Administered 2019-04-19: 17 g via ORAL
  Filled 2019-04-19 (×2): qty 1

## 2019-04-19 MED ORDER — OXYCODONE HCL 5 MG PO TABS: 5.0000 mg | ORAL_TABLET | ORAL | 0 refills | Status: DC | PRN

## 2019-04-19 MED ORDER — ENOXAPARIN SODIUM 40 MG/0.4ML ~~LOC~~ SOLN: 40.0000 mg | SUBCUTANEOUS | 0 refills | Status: DC

## 2019-04-19 MED ORDER — TRAMADOL HCL 50 MG PO TABS
50.0000 mg | ORAL_TABLET | Freq: Four times a day (QID) | ORAL | Status: DC | PRN
  Administered 2019-04-19: 50 mg via ORAL
  Filled 2019-04-19: qty 1

## 2019-04-19 MED ORDER — DOCUSATE SODIUM 100 MG PO CAPS
200.0000 mg | ORAL_CAPSULE | Freq: Two times a day (BID) | ORAL | Status: DC
  Administered 2019-04-19: 200 mg via ORAL
  Filled 2019-04-19 (×2): qty 2

## 2019-04-19 NOTE — Progress Notes (Signed)
    Subjective:  Patient reports pain as mild to moderate.  Denies N/V/CP/SOB. Ambulated in room.  Objective:   VITALS:   Vitals:   04/18/19 1042 04/18/19 1537 04/18/19 2135 04/19/19 0534  BP: (!) 147/96 131/76 139/88 (!) 127/92  Pulse: 78 82 83 77  Resp: 17 16 18 16   Temp: 98.5 F (36.9 C) 98.1 F (36.7 C) 98.4 F (36.9 C) 97.7 F (36.5 C)  TempSrc: Oral Oral Oral Oral  SpO2: 91% 94% 96% 93%  Weight:      Height:        NAD ABD soft Sensation intact distally Intact pulses distally Dorsiflexion/Plantar flexion intact Incision: dressing C/D/I Compartment soft   Lab Results  Component Value Date   WBC 7.8 04/19/2019   HGB 9.7 (L) 04/19/2019   HCT 30.6 (L) 04/19/2019   MCV 93.0 04/19/2019   PLT 186 04/19/2019   BMET    Component Value Date/Time   NA 133 (L) 04/18/2019 0318   NA 141 03/02/2017 0753   K 4.0 04/18/2019 0318   CL 105 04/18/2019 0318   CO2 21 (L) 04/18/2019 0318   GLUCOSE 100 (H) 04/18/2019 0318   GLUCOSE 100 (H) 05/05/2006 0839   BUN 22 04/18/2019 0318   BUN 26 03/02/2017 0753   CREATININE 0.83 04/18/2019 0318   CREATININE 0.99 03/10/2014 1445   CALCIUM 8.7 (L) 04/18/2019 0318   GFRNONAA >60 04/18/2019 0318   GFRAA >60 04/18/2019 0318     Assessment/Plan: 3 Days Post-Op   Active Problems:   Hip fracture (HCC)   WBAT with walker DVT ppx: Lovenox, SCDs, TEDS PO pain control PT/OT Dispo: D/C planning   Jesse [name] Jesse [name] 04/19/2019, 7:50 AM   Rod Can, MD Cell: [phone] Jesse [name] is now [facility]  [facility] [address], [address], [place], [place] [address] Phone: [phone] [email or web address] Facebook  [employer]

## 2019-04-19 NOTE — Plan of Care (Signed)
  Problem: Clinical Measurements: Goal: Ability to maintain clinical measurements within normal limits will improve Outcome: Progressing Goal: Will remain free from infection Outcome: Progressing   Problem: Activity: Goal: Risk for activity intolerance will decrease Outcome: Progressing   Problem: Safety: Goal: Ability to remain free from injury will improve Outcome: Progressing   

## 2019-04-19 NOTE — Progress Notes (Signed)
PROGRESS NOTE    Jesse [name]  [id number] DOB: June 03, 1928 DOA: 04/15/2019 PCP: [name], MD   Brief Narrative:85 y.o.malewith medical history significant ofCAD, hypertension, MI, right bundle branch block, AAA, prostate cancer, came in with complaints of left hip pain after falling last night. Patient does not know how he fell however he thinks that he is slipped on his home shoes or his feet got caught on his home shoes and he slipped and fell. According to his son he always walks dragging his feet which is normal for him. He denies any chest pain shortness of breath nausea vomiting diarrhea. He denies any fever chills cough. At baseline he stays home watches TV all night and sleeps all day. He sees Dr. early for vascular issues and aneurysms. He denies any urinary complaints.  ED Course:Blood pressure 96/63pulse is 7595% on room air. Sodium 137 potassium 3.7 BUN 30 creatinine 1.18 white count 11.1 hemoglobin 11.9 platelet count 192. MRI of the hip shows incomplete nondisplaced intertrochanteric fracture of the proximal left femur intramuscular hematoma of the left gluteus maximus muscle. COVID-19 pending. Son reports that he does not leave his house and go anywhere. He has not had any sick contacts   Assessment & Plan:   Active Problems:   Hip fracture (HCC)   #1 left hip fracture status post fall deniesLOC.Denies any chest pain shortness of breath nausea vomiting diarrhea.He is status post intramedullary fixation of the left femur. His activity has been increased to weightbearing as tolerated today.PT has recommended SNF as patient not able to walk alone safely needs 24 hours supervision and assistance with mobility and transfers.he was mobilizing with out any assistance prior to admit.he lives alone.he cannot walk properly due to recent surgery and cannot go home and PT recommends SNF.. Waiting FOR COVID TEST TO COME BACK FOR SNF DC. tramadol for pain control DC  morphine.  #2 history of CAD and CABG in the 1992-last echo I have is from 05/2018 with ejection fraction 40 to 45%.Continue beta-blocker.Appreciate cardiology input.Restarted baby aspirin.  #3 dehydrationpatient received IV fluids  #4 history of hypertension  RESTART home meds #5 systolic heart failure with ejection fraction 40 to 45%-stable not on any diuretics at home  #6 diffuse aneurysmal disease with iliac aneurysm popliteal artery and some thoracic aortic aneurysm followed by Dr. Donnetta Hutching stable  #7 history of upper GI bleed with duodenal ulcer I will put him on PPI  #8 anemia chronic hemoglobin dropped from 11-9.4 partly with hemodilution no signs of active bleeding monitor closely  #9 constipation add MiraLAX and Colace  DVT prophylaxis:Lovenox  code Status:DNR Family Communication:Discussed with son Disposition Plan:pending snf/covid Consults called:Ortho and cardiology    Estimated body mass index is 24.01 kg/m as calculated from the following:   Height as of this encounter: 6' (1.829 m).   Weight as of this encounter: 80.3 kg.    Subjective:  He is resting in bed no specific complaints pain controlled has not had a BM not eating much Objective: Vitals:   04/18/19 1042 04/18/19 1537 04/18/19 2135 04/19/19 0534  BP: (!) 147/96 131/76 139/88 (!) 127/92  Pulse: 78 82 83 77  Resp: 17 16 18 16   Temp: 98.5 F (36.9 C) 98.1 F (36.7 C) 98.4 F (36.9 C) 97.7 F (36.5 C)  TempSrc: Oral Oral Oral Oral  SpO2: 91% 94% 96% 93%  Weight:      Height:        Intake/Output Summary (Last 24 hours) at  04/19/2019 1247 Last data filed at 04/19/2019 0900 Gross per 24 hour  Intake 800 ml  Output 1400 ml  Net -600 ml   Filed Weights   04/15/19 0903 04/16/19 1133  Weight: 80.3 kg 80.3 kg    Examination:  General exam: Appears calm and comfortable  Respiratory system: Clear to auscultation. Respiratory effort normal. Cardiovascular system: S1 & S2  heard, RRR. No JVD, murmurs, rubs, gallops or clicks. No pedal edema. Gastrointestinal system: Abdomen is nondistended, soft and nontender. No organomegaly or masses felt. Normal bowel sounds heard. Central nervous system: Alert and oriented. No focal neurological deficits. Extremities: Left hip incision dressings clean dry no drainage noted  skin: No rashes, lesions or ulcers Psychiatry: Judgement and insight appear normal. Mood & affect appropriate.     Data Reviewed: I have personally reviewed following labs and imaging studies  CBC: Recent Labs  Lab 04/15/19 1218 04/16/19 0308 04/17/19 0304 04/18/19 0318 04/19/19 0307  WBC 11.1* 6.4 8.3 7.4 7.8  HGB 11.9* 9.9* 9.4* 9.3* 9.7*  HCT 37.6* 31.4* 30.2* 29.5* 30.6*  MCV 93.3 94.3 96.5 94.9 93.0  PLT 192 157 144* 157 [id number]   Basic Metabolic Panel: Recent Labs  Lab 04/15/19 1218 04/16/19 0308 04/17/19 0304 04/18/19 0318  NA 137 136 136 133*  K 3.7 3.8 4.0 4.0  CL 107 110 108 105  CO2 22 18* 20* 21*  GLUCOSE 112* 93 108* 100*  BUN 30* 26* 22 22  CREATININE 1.18 0.95 0.98 0.83  CALCIUM 9.4 8.5* 8.6* 8.7*   GFR: Estimated Creatinine Clearance: 67.5 mL/min (by C-G formula based on SCr of 0.83 mg/dL). Liver Function Tests: Recent Labs  Lab 04/16/19 0308 04/17/19 0304  AST 17 17  ALT 15 15  ALKPHOS 35* 37*  BILITOT 1.7* 1.6*  PROT 5.2* 5.1*  ALBUMIN 3.1* 2.9*   No results for input(s): LIPASE, AMYLASE in the last 168 hours. No results for input(s): AMMONIA in the last 168 hours. Coagulation Profile: Recent Labs  Lab 04/15/19 1218 04/16/19 0308  INR 1.2 1.4*   Cardiac Enzymes: No results for input(s): CKTOTAL, CKMB, CKMBINDEX, TROPONINI in the last 168 hours. BNP (last 3 results) No results for input(s): PROBNP in the last 8760 hours. HbA1C: No results for input(s): HGBA1C in the last 72 hours. CBG: No results for input(s): GLUCAP in the last 168 hours. Lipid Profile: No results for input(s): CHOL, HDL,  LDLCALC, TRIG, CHOLHDL, LDLDIRECT in the last 72 hours. Thyroid Function Tests: No results for input(s): TSH, T4TOTAL, FREET4, T3FREE, THYROIDAB in the last 72 hours. Anemia Panel: No results for input(s): VITAMINB12, FOLATE, FERRITIN, TIBC, IRON, RETICCTPCT in the last 72 hours. Sepsis Labs: No results for input(s): PROCALCITON, LATICACIDVEN in the last 168 hours.  Recent Results (from the past 240 hour(s))  SARS Coronavirus 2 [facility] order, Performed in [facility] hospital lab) Nasopharyngeal Nasopharyngeal Swab     Status: None   Collection Time: 04/15/19  6:38 PM   Specimen: Nasopharyngeal Swab  Result Value Ref Range Status   SARS Coronavirus 2 NEGATIVE NEGATIVE Final    Comment: (NOTE) If result is NEGATIVE SARS-CoV-2 target nucleic acids are NOT DETECTED. The SARS-CoV-2 RNA is generally detectable in upper and lower  respiratory specimens during the acute phase of infection. The lowest  concentration of SARS-CoV-2 viral copies this assay can detect is 250  copies / mL. A negative result does not preclude SARS-CoV-2 infection  and should not be used as the sole basis for treatment or  other  patient management decisions.  A negative result may occur with  improper specimen collection / handling, submission of specimen other  than nasopharyngeal swab, presence of viral mutation(s) within the  areas targeted by this assay, and inadequate number of viral copies  (<250 copies / mL). A negative result must be combined with clinical  observations, patient history, and epidemiological information. If result is POSITIVE SARS-CoV-2 target nucleic acids are DETECTED. The SARS-CoV-2 RNA is generally detectable in upper and lower  respiratory specimens dur ing the acute phase of infection.  Positive  results are indicative of active infection with SARS-CoV-2.  Clinical  correlation with patient history and other diagnostic information is  necessary to determine patient infection  status.  Positive results do  not rule out bacterial infection or co-infection with other viruses. If result is PRESUMPTIVE POSTIVE SARS-CoV-2 nucleic acids MAY BE PRESENT.   A presumptive positive result was obtained on the submitted specimen  and confirmed on repeat testing.  While 2019 novel coronavirus  (SARS-CoV-2) nucleic acids may be present in the submitted sample  additional confirmatory testing may be necessary for epidemiological  and / or clinical management purposes  to differentiate between  SARS-CoV-2 and other Sarbecovirus currently known to infect humans.  If clinically indicated additional testing with an alternate test  methodology [phone]) is advised. The SARS-CoV-2 RNA is generally  detectable in upper and lower respiratory sp ecimens during the acute  phase of infection. The expected result is Negative. Fact Sheet for Patients:  StrictlyIdeas.no Fact Sheet for Healthcare Providers: BankingDealers.co.za This test is not yet approved or cleared by the [place] FDA and has been authorized for detection and/or diagnosis of SARS-CoV-2 by FDA under an Emergency Use Authorization (EUA).  This EUA will remain in effect (meaning this test can be used) for the duration of the COVID-19 declaration under Section 564(b)(1) of the Act, 21 U.S.C. section 360bbb-3(b)(1), unless the authorization is terminated or revoked sooner. Performed at [facility], [place] [address], [place], [place] [address]   Surgical pcr screen     Status: None   Collection Time: 04/15/19  9:39 PM   Specimen: Nasal Mucosa; Nasal Swab  Result Value Ref Range Status   MRSA, PCR NEGATIVE NEGATIVE Final   Staphylococcus aureus NEGATIVE NEGATIVE Final    Comment: (NOTE) The Xpert SA Assay (FDA approved for NASAL specimens in patients 45 years of age and older), is one component of a comprehensive surveillance program. It is not  intended to diagnose infection nor to guide or monitor treatment. Performed at [facility], [place] [address], [place], [place] [address]          Radiology Studies: No results found.      Scheduled Meds: . aspirin EC  81 mg Oral Daily  . docusate sodium  100 mg Oral BID  . enoxaparin (LOVENOX) injection  40 mg Subcutaneous Q24H  . fenofibrate  160 mg Oral Daily  . loratadine  10 mg Oral Daily  . polyvinyl alcohol  1-2 drop Both Eyes QHS  . senna  1 tablet Oral BID   Continuous Infusions:   LOS: 4 days     Georgette Shell, MD [facility] If 7PM-7AM, please contact night-coverage [email or web address] Password [email or web address] 04/19/2019, 12:47 PM

## 2019-04-19 NOTE — Progress Notes (Signed)
Physical Therapy Treatment Patient Details Name: Jesse [name] MRN: [id number] DOB: 23-Dec-1927 Today's Date: 04/19/2019    History of Present Illness 85 y.o. male with medical history significant of CAD, hypertension, MI, right bundle branch block, AAA, prostate cancer. Pt admitted s/p fall on with Lt intertrochanteric femur fracture now s/p IM nail.    PT Comments    Pt ambulated 6' with RW, distance limited by L hip pain. Pt stood with RW for ~ 2.5 minutes for toileting. Performed LLE strengthening exercises with min assist. Pt reports he "popped the rib cartilage" on R side yesterday. Pt is slowly progressing with mobility.     Follow Up Recommendations  SNF     Equipment Recommendations  None recommended by PT    Recommendations for Other Services       Precautions / Restrictions Precautions Precautions: Fall Restrictions Weight Bearing Restrictions: No LLE Weight Bearing: Weight bearing as tolerated    Mobility  Bed Mobility Overal bed mobility: Needs Assistance Bed Mobility: Supine to Sit     Supine to sit: HOB elevated;Supervision     General bed mobility comments: increased time/effort 2* pain in L hip  Transfers Overall transfer level: Needs assistance Equipment used: Rolling walker (2 wheeled) Transfers: Sit to/from Stand Sit to Stand: From elevated surface;Min guard         General transfer comment: VCs hand placement  Ambulation/Gait Ambulation/Gait assistance: Min guard Gait Distance (Feet): 6 Feet Assistive device: Rolling walker (2 wheeled) Gait Pattern/deviations: Step-to pattern;Decreased step length - left;Decreased step length - right Gait velocity: decr   General Gait Details: distance limited by pain   Stairs             Wheelchair Mobility    Modified Rankin (Stroke Patients Only)       Balance Overall balance assessment: Needs assistance Sitting-balance support: Feet supported;No upper extremity supported Sitting  balance-Leahy Scale: Fair Sitting balance - Comments: pt required assist to press up to sitting, able to maintain balance on his own   Standing balance support: During functional activity;Single extremity supported Standing balance-Leahy Scale: Poor Standing balance comment: pt stood for ~2 1/2 minutes with single UE supported on RW while urinating                            Cognition Arousal/Alertness: Awake/alert Behavior During Therapy: WFL for tasks assessed/performed Overall Cognitive Status: Within Functional Limits for tasks assessed                                        Exercises Total Joint Exercises Ankle Circles/Pumps: AROM;Seated;10 reps;Both Short Arc Quad: AROM;Left;10 reps;Supine Heel Slides: AAROM;Left;10 reps;Supine Hip ABduction/ADduction: AAROM;Left;10 reps;Supine Long Arc Quad: AROM;Left;5 reps;Seated    General Comments        Pertinent Vitals/Pain Pain Assessment: Faces Faces Pain Scale: Hurts even more Pain Location: L hip in standing Pain Descriptors / Indicators: Sore;Grimacing;Guarding Pain Intervention(s): Limited activity within patient's tolerance;Monitored during session;Premedicated before session;Ice applied    Home Living                      Prior Function            PT Goals (current goals can now be found in the care plan section) Acute Rehab PT Goals Patient Stated Goal: pt wants to walk independently again  PT Goal Formulation: With patient Time For Goal Achievement: 04/24/19 Potential to Achieve Goals: Good Progress towards PT goals: Progressing toward goals    Frequency    Min 3X/week      PT Plan Current plan remains appropriate    Co-evaluation              AM-PAC PT "6 Clicks" Mobility   Outcome Measure  Help needed turning from your back to your side while in a flat bed without using bedrails?: A Little Help needed moving from lying on your back to sitting on the side  of a flat bed without using bedrails?: A Little Help needed moving to and from a bed to a chair (including a wheelchair)?: A Little Help needed standing up from a chair using your arms (e.g., wheelchair or bedside chair)?: A Little Help needed to walk in hospital room?: A Little Help needed climbing 3-5 steps with a railing? : A Lot 6 Click Score: 17    End of Session Equipment Utilized During Treatment: Gait belt Activity Tolerance: Patient tolerated treatment well Patient left: in chair;with call bell/phone within reach;with chair alarm set Nurse Communication: Mobility status PT Visit Diagnosis: Unsteadiness on feet (R26.81);Other abnormalities of gait and mobility (R26.89);Muscle weakness (generalized) (M62.81);History of falling (Z91.81);Difficulty in walking, not elsewhere classified (R26.2)     Time: 1001-1030 PT Time Calculation (min) (ACUTE ONLY): 29 min  Charges:  $Gait Training: 8-22 mins $Therapeutic Exercise: 8-22 mins                     Blondell Reveal Kistler PT 04/19/2019  Acute Rehabilitation Services Pager [phone] Office [phone]

## 2019-04-20 DIAGNOSIS — M6281 Muscle weakness (generalized): Secondary | ICD-10-CM | POA: Diagnosis not present

## 2019-04-20 DIAGNOSIS — I5022 Chronic systolic (congestive) heart failure: Secondary | ICD-10-CM | POA: Diagnosis not present

## 2019-04-20 DIAGNOSIS — E8809 Other disorders of plasma-protein metabolism, not elsewhere classified: Secondary | ICD-10-CM | POA: Diagnosis not present

## 2019-04-20 DIAGNOSIS — M2550 Pain in unspecified joint: Secondary | ICD-10-CM | POA: Diagnosis not present

## 2019-04-20 DIAGNOSIS — N179 Acute kidney failure, unspecified: Secondary | ICD-10-CM | POA: Diagnosis not present

## 2019-04-20 DIAGNOSIS — R41841 Cognitive communication deficit: Secondary | ICD-10-CM | POA: Diagnosis not present

## 2019-04-20 DIAGNOSIS — R5381 Other malaise: Secondary | ICD-10-CM | POA: Diagnosis not present

## 2019-04-20 DIAGNOSIS — Z9181 History of falling: Secondary | ICD-10-CM | POA: Diagnosis not present

## 2019-04-20 DIAGNOSIS — R2689 Other abnormalities of gait and mobility: Secondary | ICD-10-CM | POA: Diagnosis not present

## 2019-04-20 DIAGNOSIS — I2510 Atherosclerotic heart disease of native coronary artery without angina pectoris: Secondary | ICD-10-CM | POA: Diagnosis not present

## 2019-04-20 DIAGNOSIS — Z8719 Personal history of other diseases of the digestive system: Secondary | ICD-10-CM | POA: Diagnosis not present

## 2019-04-20 DIAGNOSIS — I739 Peripheral vascular disease, unspecified: Secondary | ICD-10-CM | POA: Diagnosis not present

## 2019-04-20 DIAGNOSIS — E860 Dehydration: Secondary | ICD-10-CM | POA: Diagnosis not present

## 2019-04-20 DIAGNOSIS — W19XXXD Unspecified fall, subsequent encounter: Secondary | ICD-10-CM | POA: Diagnosis not present

## 2019-04-20 DIAGNOSIS — I10 Essential (primary) hypertension: Secondary | ICD-10-CM | POA: Diagnosis not present

## 2019-04-20 DIAGNOSIS — I110 Hypertensive heart disease with heart failure: Secondary | ICD-10-CM | POA: Diagnosis not present

## 2019-04-20 DIAGNOSIS — W19XXXA Unspecified fall, initial encounter: Secondary | ICD-10-CM | POA: Diagnosis not present

## 2019-04-20 DIAGNOSIS — E785 Hyperlipidemia, unspecified: Secondary | ICD-10-CM | POA: Diagnosis not present

## 2019-04-20 DIAGNOSIS — Z7401 Bed confinement status: Secondary | ICD-10-CM | POA: Diagnosis not present

## 2019-04-20 DIAGNOSIS — S72142D Displaced intertrochanteric fracture of left femur, subsequent encounter for closed fracture with routine healing: Secondary | ICD-10-CM | POA: Diagnosis not present

## 2019-04-20 DIAGNOSIS — S72145D Nondisplaced intertrochanteric fracture of left femur, subsequent encounter for closed fracture with routine healing: Secondary | ICD-10-CM | POA: Diagnosis not present

## 2019-04-20 DIAGNOSIS — R0902 Hypoxemia: Secondary | ICD-10-CM | POA: Diagnosis not present

## 2019-04-20 DIAGNOSIS — S72002D Fracture of unspecified part of neck of left femur, subsequent encounter for closed fracture with routine healing: Secondary | ICD-10-CM | POA: Diagnosis not present

## 2019-04-20 DIAGNOSIS — Z419 Encounter for procedure for purposes other than remedying health state, unspecified: Secondary | ICD-10-CM | POA: Diagnosis not present

## 2019-04-20 DIAGNOSIS — D649 Anemia, unspecified: Secondary | ICD-10-CM | POA: Diagnosis not present

## 2019-04-20 MED ORDER — POLYETHYLENE GLYCOL 3350 17 G PO PACK: 17.0000 g | PACK | Freq: Every day | ORAL | 0 refills | Status: DC

## 2019-04-20 MED ORDER — DOCUSATE SODIUM 100 MG PO CAPS: 200.0000 mg | ORAL_CAPSULE | Freq: Two times a day (BID) | ORAL | 0 refills | Status: DC

## 2019-04-20 NOTE — TOC Transition Note (Signed)
Transition of Care [facility]) - CM/SW Discharge Note   Patient Details  Name: Jesse [name] MRN: [id number] Date of Birth: June 12, 1928  Transition of Care [facility]) CM/SW Contact:  Servando Snare, LCSW Phone Number: 04/20/2019, 11:27 AM   Clinical Narrative:    Patient to dc to [facility]. Dc docs faxed to facility. Notified patients son, [name].  RN report number: [phone]   Final next level of care: Skilled Nursing Facility Barriers to Discharge: No Barriers Identified   Patient Goals and CMS Choice Patient states their goals for this hospitalization and ongoing recovery are:: wants pt to get back to his baseline of independence, driving, walking and taking care of himsel at home CMS Medicare.gov Compare Post Acute Care list provided to:: Patient Represenative (must comment)(son, Cannon Kettle, [facility]) Choice offered to / list presented to : Adult Children  Discharge Placement              Patient chooses bed at: [facility] Patient to be transferred to facility by: [place] Name of family member notified: left message for son [name] Patient and family notified of of transfer: 04/20/19  Discharge Plan and Services In-house Referral: Clinical Social Work Discharge Planning Services: [employer] Consult Post Acute Care Choice: [place]                               Social Determinants of Health (SDOH) Interventions     Readmission Risk Interventions No flowsheet data found.

## 2019-04-20 NOTE — Progress Notes (Signed)
Orthopedics Progress Note  Subjective: No complaints. Pain controlled  Objective:  Vitals:   04/19/19 2104 04/20/19 0515  BP: 118/76 125/75  Pulse: 84 72  Resp: 18 16  Temp: 98.5 F (36.9 C) 97.8 F (36.6 C)  SpO2: 93% 90%    General: Awake and alert  Musculoskeletal: Hip dressing CDI, no drainage, mod swelling distally Neurovascularly intact  Lab Results  Component Value Date   WBC 7.8 04/19/2019   HGB 9.7 (L) 04/19/2019   HCT 30.6 (L) 04/19/2019   MCV 93.0 04/19/2019   PLT 186 04/19/2019       Component Value Date/Time   NA 133 (L) 04/18/2019 0318   NA 141 03/02/2017 0753   K 4.0 04/18/2019 0318   CL 105 04/18/2019 0318   CO2 21 (L) 04/18/2019 0318   GLUCOSE 100 (H) 04/18/2019 0318   GLUCOSE 100 (H) 05/05/2006 0839   BUN 22 04/18/2019 0318   BUN 26 03/02/2017 0753   CREATININE 0.83 04/18/2019 0318   CREATININE 0.99 03/10/2014 1445   CALCIUM 8.7 (L) 04/18/2019 0318   GFRNONAA >60 04/18/2019 0318   GFRAA >60 04/18/2019 0318    Lab Results  Component Value Date   INR 1.4 (H) 04/16/2019   INR 1.2 04/15/2019   INR 1.23 06/09/2015    Assessment/Plan: POD #2 s/p Procedure(s): INTRAMEDULLARY (IM) NAIL INTERTROCHANTRIC TDWB L LE. Continue mobilization D/C planning  Doran Heater. Veverly Fells, MD 04/20/2019 7:37 AM

## 2019-04-20 NOTE — Discharge Summary (Addendum)
Physician Discharge Summary  Jesse [name] [id number] DOB: 12/16/1927 DOA: 04/15/2019  PCP: [name], MD  Admit date: 04/15/2019 Discharge date: 04/20/2019  Admitted From: Home Disposition: Nursing home Recommendations for Outpatient Follow-up:  1. Follow up with PCP in 1-2 weeks 2. Please obtain BMP/CBC in one week 3. Follow-up with Ortho  Home Health none  equipment/Devices: None Discharge Condition stable CODE STATUS DNR Diet recommendation: Cardiac  Brief/Interim Summary:85 y.o.malewith medical history significant ofCAD, hypertension, MI, right bundle branch block, AAA, prostate cancer, came in with complaints of left hip pain after falling last night. Patient does not know how he fell however he thinks that he is slipped on his home shoes or his feet got caught on his home shoes and he slipped and fell. According to his son he always walks dragging his feet which is normal for him. He denies any chest pain shortness of breath nausea vomiting diarrhea. He denies any fever chills cough. At baseline he stays home watches TV all night and sleeps all day. He sees Dr. early for vascular issues and aneurysms. He denies any urinary complaints.  ED Course:Blood pressure 96/63pulse is 7595% on room air. Sodium 137 potassium 3.7 BUN 30 creatinine 1.18 white count 11.1 hemoglobin 11.9 platelet count 192. MRI of the hip shows incomplete nondisplaced intertrochanteric fracture of the proximal left femur intramuscular hematoma of the left gluteus maximus muscle. COVID-19 pending. Son reports that he does not leave his house and go anywhere. He has not had any sick contacts   Discharge Diagnoses:  Active Problems:   Hip fracture (HCC)   Surgery, elective   #1 left hip fracture status post fall deniesLOC.Denies any chest pain shortness of breath nausea vomiting diarrhea.He is status post intramedullary fixation of the left femur. His activity has been increased to  weightbearing as tolerated today.PT has recommended SNF as patient not able to walk alone safely needs 24 hours supervision and assistance with mobility and transfers.hewas mobilizing with out any assistance prior to admit.helives alone.hecannot walk properly due to recent surgery and cannot go home and PT recommends SNF.  Repeat COVID test 04/19/2019-.  #2 history of CAD and CABG in the 1992-last echo I have is from 05/2018 with ejection fraction 40 to 45%.Continue beta-blocker.Restarted baby aspirin.  #3 dehydrationpatient received IV fluids  #4 history of hypertensioncontinue Cozaar I have stopped the hydrochlorothiazide for now since he was dehydrated please follow as an outpatient and restart if needed.  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 Donnetta Hutching stable  #7 history of upper GI bleed with duodenal ulcer I will put him on PPI  #8 anemia chronic hemoglobin dropped from 11-9.4 partly with hemodilution no signs of active bleeding monitor closely  #9 constipation add MiraLAX and Colace  Estimated body mass index is 24.01 kg/m as calculated from the following:   Height as of this encounter: 6' (1.829 m).   Weight as of this encounter: 80.3 kg.  Discharge Instructions  Discharge Instructions    Call MD for:  difficulty breathing, headache or visual disturbances   Complete by: As directed    Call MD for:  persistant nausea and vomiting   Complete by: As directed    Call MD for:  redness, tenderness, or signs of infection (pain, swelling, redness, odor or green/yellow discharge around incision site)   Complete by:  As directed    Diet - low sodium heart healthy   Complete by: As directed    Increase activity slowly   Complete by: As directed      Allergies as of 04/20/2019      Reactions    Quinolones    Adhesive [tape] Itching, Rash   Lactose Intolerance (gi) Other (See Comments)   Gas, bloating, stomach indigestion       Medication List    STOP taking these medications   hydrochlorothiazide 12.5 MG tablet Commonly known as: HYDRODIURIL     TAKE these medications   acetaminophen 500 MG tablet Commonly known as: TYLENOL Take 500 mg by mouth every 6 (six) hours as needed for mild pain or headache. What changed: Another medication with the same name was removed. Continue taking this medication, and follow the directions you see here.   aspirin 81 MG tablet Take 81 mg by mouth daily.   B-12 PO Take 1 tablet by mouth daily.   carboxymethylcellulose 0.5 % Soln Commonly known as: REFRESH PLUS Apply 1-2 drops to eye at bedtime.   carvedilol 12.5 MG tablet Commonly known as: COREG TAKE 1 TABLET BY MOUTH 2 TIMES DAILY WITH A MEAL What changed: See the new instructions.   docusate sodium 100 MG capsule Commonly known as: COLACE Take 2 capsules (200 mg total) by mouth 2 (two) times daily.   enoxaparin 40 MG/0.4ML injection Commonly known as: LOVENOX Inject 0.4 mLs (40 mg total) into the skin daily for 30 doses.   fenofibrate 160 MG tablet TAKE 1 TABLET BY MOUTH DAILY   hydrocortisone 2.5 % cream Apply 1 application topically as needed (for dry ears).   loratadine 10 MG tablet Commonly known as: CLARITIN Take 10 mg by mouth daily.   losartan 50 MG tablet Commonly known as: COZAAR TAKE ONE TABLET BY MOUTH DAILY. GENERIC EQUIVALENT FOR COZAAR What changed: See the new instructions.   oxyCODONE 5 MG immediate release tablet Commonly known as: Oxy IR/ROXICODONE Take 1 tablet (5 mg total) by mouth every 4 (four) hours as needed (as needed for pain).   pantoprazole 40 MG tablet Commonly known as: PROTONIX Take 1 tablet (40 mg total) by mouth 2 (two) times daily. What changed: when to take this   polyethylene glycol 17 g packet Commonly known as: MIRALAX  / GLYCOLAX Take 17 g by mouth daily. Start taking on: April 21, 2019   simvastatin 40 MG tablet Commonly known as: ZOCOR Take 1 tablet (40 mg total) by mouth daily at 6 PM.      Follow-up Information    Swinteck, Aaron Edelman, MD. Schedule an appointment as soon as possible for a visit in 2 weeks.   Specialty: Orthopedic Surgery Why: For wound re-check Contact information: [address] [place] [address] [id number]        [name], MD Follow up.   Specialty: Family Medicine Contact information: [place] [address] [phone]        [name], MD .   Specialty: Cardiology Contact information: [phone] [place] [address] [phone]          Allergies  Allergen Reactions  . Quinolones   . Adhesive [Tape] Itching and Rash  . Lactose Intolerance (Gi) Other (See Comments)    Gas, bloating, stomach indigestion     Consultations: Ortho Dr. Lyla Glassing  Procedures/Studies: Dg Chest 1 View  Result Date: 04/15/2019 CLINICAL DATA:  Preop.  Hypertension.  EXAM: CHEST  1 VIEW COMPARISON:  01/01/2016 FINDINGS: Prior CABG. Mild cardiomegaly. No confluent opacities or effusions. No acute bony abnormality. IMPRESSION: No active disease. Electronically Signed   By: Rolm Baptise M.D.   On: 04/15/2019 18:14   Dg Knee 1-2 Views Left  Result Date: 04/15/2019 CLINICAL DATA:  Hip fracture, left leg pain EXAM: LEFT KNEE - 1-2 VIEW COMPARISON:  Lower extremity CT angiogram 12/31/2012 FINDINGS: No acute fracture. No malalignment. Joint spaces are relatively preserved. No joint effusion. Extensive vascular calcifications with focal enlargement of the calcification in the region of the popliteal artery measuring 3.6 cm in diameter suggesting a popliteal artery aneurysm. Comparison to CT angiogram 12/31/2012 demonstrated a 3.0 cm aneurysm at this site. IMPRESSION: 1. No acute osseous abnormality, left knee. 2.  Extensive atherosclerosis with an enlarging popliteal artery aneurysm measuring approximately 3.6 cm in diameter. Electronically Signed   By: Davina Poke M.D.   On: 04/15/2019 19:12   Dg Knee 2 Views Right  Result Date: 04/15/2019 CLINICAL DATA:  Preop EXAM: RIGHT KNEE - 1-2 VIEW COMPARISON:  06/01/2015 FINDINGS: Screws and cerclage wires noted within the patella. For mild degenerative changes in the right knee, best seen in the patellofemoral compartment with joint space narrowing and spurring. No fracture, subluxation or dislocation. Atherosclerotic calcifications within the femoral and popliteal arteries. There appears to be a popliteal artery aneurysm measuring approximately 3 cm above the right knee (there may be magnification, over estimating the size). IMPRESSION: Postoperative changes in the patella. No acute bony abnormality. Early degenerative changes. Vascular calcifications. Concern for popliteal artery aneurysm. This could be further evaluated with ultrasound or CT if felt clinically indicated. Electronically Signed   By: Rolm Baptise M.D.   On: 04/15/2019 18:24   Pelvis Portable  Result Date: 04/16/2019 CLINICAL DATA:  85 year old male status post open reduction internal fixation EXAM: PORTABLE PELVIS 1-2 VIEWS COMPARISON:  April 15, 2019 FINDINGS: Osteopenia. Interval surgical changes of open reduction internal fixation of left hip with antegrade femoral rod, gamma nail screw fixation and distal interlocking screw. Gas within the surgical bed. Dense calcifications of the vasculature. Surgical clips of the left proximal thigh and overlying the pelvis. IMPRESSION: Early surgical changes of left hip ORIF, with no complicating features. Osteopenia. Atherosclerosis Electronically Signed   By: Corrie Mckusick D.O.   On: 04/16/2019 15:16   Mr Hip Left Wo Contrast  Result Date: 04/15/2019 CLINICAL DATA:  Left hip pain secondary to a fall last night. EXAM: MR OF THE LEFT HIP WITHOUT CONTRAST  TECHNIQUE: Multiplanar, multisequence MR imaging was performed. No intravenous contrast was administered. COMPARISON:  Radiographs dated 04/15/2019 and CT scan of the abdomen and pelvis dated 05/04/2018 FINDINGS: Bones: There is an incomplete nondisplaced intertrochanteric fracture of the proximal left femur extending from the tip of the left greater trochanter approximately 60% of the way through the intertrochanteric region. The other bones of the pelvis are intact. Articular cartilage and labrum Articular cartilage: Slight thinning of the articular cartilage of the posterior aspect of the joint. Labrum:  Intact. Joint or bursal effusion Joint effusion:  No joint effusion. Bursae: No bursitis. Muscles and tendons Muscles and tendons: There is a 5 x 5 x 3 cm intramuscular hematoma in the left gluteus maximus muscle posterior to the left greater trochanter. There is extensive edema in the gluteus maximus muscle. There is also edema in the left quadratus femoris muscle as well as slight edema along the left iliopsoas tendon. Other findings Miscellaneous: The patient  has chronic massive internal iliac artery aneurysms, 8.6 cm on the right and 7.3 cm on the left. There appears to be blood flow in portions of each of these large aneurysms. These were demonstrated on the prior CT scan of the abdomen and pelvis dated 05/04/2018. IMPRESSION: 1. Incomplete nondisplaced intertrochanteric fracture of the proximal left femur. 2. Strains of the left gluteus maximus, left quadratus femoris, and left iliopsoas muscles. Intramuscular hematoma of the left gluteus maximus muscle 3. Chronic massive internal iliac artery aneurysms. Electronically Signed   By: Lorriane Shire M.D.   On: 04/15/2019 15:57   Dg C-arm 1-60 Min-no Report  Result Date: 04/16/2019 CLINICAL DATA:  Left-sided intramedullary nail placement. EXAM: OPERATIVE LEFT HIP (WITH PELVIS IF PERFORMED) 2 VIEWS TECHNIQUE: Fluoroscopic spot image(s) were submitted for  interpretation post-operatively. COMPARISON:  04/15/2019 FINDINGS: Examination demonstrates intramedullary nail placement over the left proximal femur with associated screw bridging the femoral neck into the femoral head as hardware is intact. Hardware bridges patient's intertrochanteric fracture as there is normal alignment over the fracture site. Mild degenerate change of the left hip. IMPRESSION: Expected changes post fixation of intertrochanteric left femoral fracture. Electronically Signed   By: Marin Olp M.D.   On: 04/16/2019 14:12   Dg Hip Operative Unilat W Or W/o Pelvis Left  Result Date: 04/16/2019 CLINICAL DATA:  Left-sided intramedullary nail placement. EXAM: OPERATIVE LEFT HIP (WITH PELVIS IF PERFORMED) 2 VIEWS TECHNIQUE: Fluoroscopic spot image(s) were submitted for interpretation post-operatively. COMPARISON:  04/15/2019 FINDINGS: Examination demonstrates intramedullary nail placement over the left proximal femur with associated screw bridging the femoral neck into the femoral head as hardware is intact. Hardware bridges patient's intertrochanteric fracture as there is normal alignment over the fracture site. Mild degenerate change of the left hip. IMPRESSION: Expected changes post fixation of intertrochanteric left femoral fracture. Electronically Signed   By: Marin Olp M.D.   On: 04/16/2019 14:12   Dg Hip Unilat With Pelvis 2-3 Views Left  Result Date: 04/15/2019 CLINICAL DATA:  Fall with left-sided hip pain. EXAM: DG HIP (WITH OR WITHOUT PELVIS) 2-3V LEFT COMPARISON:  None. FINDINGS: Subtle irregularity of the lesser trochanter is noted on dedicated views of the left hip. Right hip is located. Patient is osteopenic with spinal degenerative changes and signs of vascular disease with postoperative changes and vascular calcifications overlying the hip and pelvis in various locations. The left hip is located. IMPRESSION: Question subtle left intertrochanteric fracture in this osteopenic  patient. Further imaging with MRI may be helpful. Electronically Signed   By: Zetta Bills M.D.   On: 04/15/2019 10:07    (Echo, Carotid, EGD, Colonoscopy, ERCP)    Subjective:  Resting in bed anxious to go home denies any new complaints nausea vomiting Discharge Exam: Vitals:   04/19/19 2104 04/20/19 0515  BP: 118/76 125/75  Pulse: 84 72  Resp: 18 16  Temp: 98.5 F (36.9 C) 97.8 F (36.6 C)  SpO2: 93% 90%   Vitals:   04/19/19 0534 04/19/19 1507 04/19/19 2104 04/20/19 0515  BP: (!) 127/92 125/75 118/76 125/75  Pulse: 77 75 84 72  Resp: 16 15 18 16   Temp: 97.7 F (36.5 C) 98.3 F (36.8 C) 98.5 F (36.9 C) 97.8 F (36.6 C)  TempSrc: Oral Oral Oral Oral  SpO2: 93% 100% 93% 90%  Weight:      Height:        General: Pt is alert, awake, not in acute distress Cardiovascular: RRR, S1/S2 +, no rubs, no gallops  Respiratory: CTA bilaterally, no wheezing, no rhonchi Abdominal: Soft, NT, ND, bowel sounds + Extremities: no edema, no cyanosis    The results of significant diagnostics from this hospitalization (including imaging, microbiology, ancillary and laboratory) are listed below for reference.     Microbiology: Recent Results (from the past 240 hour(s))  SARS Coronavirus 2 [facility] order, Performed in [facility] hospital lab) Nasopharyngeal Nasopharyngeal Swab     Status: None   Collection Time: 04/15/19  6:38 PM   Specimen: Nasopharyngeal Swab  Result Value Ref Range Status   SARS Coronavirus 2 NEGATIVE NEGATIVE Final    Comment: (NOTE) If result is NEGATIVE SARS-CoV-2 target nucleic acids are NOT DETECTED. The SARS-CoV-2 RNA is generally detectable in upper and lower  respiratory specimens during the acute phase of infection. The lowest  concentration of SARS-CoV-2 viral copies this assay can detect is 250  copies / mL. A negative result does not preclude SARS-CoV-2 infection  and should not be used as the sole basis for treatment or other  patient  management decisions.  A negative result may occur with  improper specimen collection / handling, submission of specimen other  than nasopharyngeal swab, presence of viral mutation(s) within the  areas targeted by this assay, and inadequate number of viral copies  (<250 copies / mL). A negative result must be combined with clinical  observations, patient history, and epidemiological information. If result is POSITIVE SARS-CoV-2 target nucleic acids are DETECTED. The SARS-CoV-2 RNA is generally detectable in upper and lower  respiratory specimens dur ing the acute phase of infection.  Positive  results are indicative of active infection with SARS-CoV-2.  Clinical  correlation with patient history and other diagnostic information is  necessary to determine patient infection status.  Positive results do  not rule out bacterial infection or co-infection with other viruses. If result is PRESUMPTIVE POSTIVE SARS-CoV-2 nucleic acids MAY BE PRESENT.   A presumptive positive result was obtained on the submitted specimen  and confirmed on repeat testing.  While 2019 novel coronavirus  (SARS-CoV-2) nucleic acids may be present in the submitted sample  additional confirmatory testing may be necessary for epidemiological  and / or clinical management purposes  to differentiate between  SARS-CoV-2 and other Sarbecovirus currently known to infect humans.  If clinically indicated additional testing with an alternate test  methodology [phone]) is advised. The SARS-CoV-2 RNA is generally  detectable in upper and lower respiratory sp ecimens during the acute  phase of infection. The expected result is Negative. Fact Sheet for Patients:  StrictlyIdeas.no Fact Sheet for Healthcare Providers: BankingDealers.co.za This test is not yet approved or cleared by the [place] FDA and has been authorized for detection and/or diagnosis of SARS-CoV-2 by FDA under  an Emergency Use Authorization (EUA).  This EUA will remain in effect (meaning this test can be used) for the duration of the COVID-19 declaration under Section 564(b)(1) of the Act, 21 U.S.C. section 360bbb-3(b)(1), unless the authorization is terminated or revoked sooner. Performed at [facility], [place] [address], [place], [place] [address]   Surgical pcr screen     Status: None   Collection Time: 04/15/19  9:39 PM   Specimen: Nasal Mucosa; Nasal Swab  Result Value Ref Range Status   MRSA, PCR NEGATIVE NEGATIVE Final   Staphylococcus aureus NEGATIVE NEGATIVE Final    Comment: (NOTE) The Xpert SA Assay (FDA approved for NASAL specimens in patients 15 years of age and older), is one component of a comprehensive  surveillance program. It is not intended to diagnose infection nor to guide or monitor treatment. Performed at [facility], [place] [address], [place], [place] [address]   SARS Coronavirus 2 [facility] order, Performed in [facility] hospital lab) Nasopharyngeal Nasopharyngeal Swab     Status: None   Collection Time: 04/19/19  2:01 PM   Specimen: Nasopharyngeal Swab  Result Value Ref Range Status   SARS Coronavirus 2 NEGATIVE NEGATIVE Final    Comment: (NOTE) If result is NEGATIVE SARS-CoV-2 target nucleic acids are NOT DETECTED. The SARS-CoV-2 RNA is generally detectable in upper and lower  respiratory specimens during the acute phase of infection. The lowest  concentration of SARS-CoV-2 viral copies this assay can detect is 250  copies / mL. A negative result does not preclude SARS-CoV-2 infection  and should not be used as the sole basis for treatment or other  patient management decisions.  A negative result may occur with  improper specimen collection / handling, submission of specimen other  than nasopharyngeal swab, presence of viral mutation(s) within the  areas targeted by this assay, and inadequate number of viral copies   (<250 copies / mL). A negative result must be combined with clinical  observations, patient history, and epidemiological information. If result is POSITIVE SARS-CoV-2 target nucleic acids are DETECTED. The SARS-CoV-2 RNA is generally detectable in upper and lower  respiratory specimens dur ing the acute phase of infection.  Positive  results are indicative of active infection with SARS-CoV-2.  Clinical  correlation with patient history and other diagnostic information is  necessary to determine patient infection status.  Positive results do  not rule out bacterial infection or co-infection with other viruses. If result is PRESUMPTIVE POSTIVE SARS-CoV-2 nucleic acids MAY BE PRESENT.   A presumptive positive result was obtained on the submitted specimen  and confirmed on repeat testing.  While 2019 novel coronavirus  (SARS-CoV-2) nucleic acids may be present in the submitted sample  additional confirmatory testing may be necessary for epidemiological  and / or clinical management purposes  to differentiate between  SARS-CoV-2 and other Sarbecovirus currently known to infect humans.  If clinically indicated additional testing with an alternate test  methodology [phone]) is advised. The SARS-CoV-2 RNA is generally  detectable in upper and lower respiratory sp ecimens during the acute  phase of infection. The expected result is Negative. Fact Sheet for Patients:  StrictlyIdeas.no Fact Sheet for Healthcare Providers: BankingDealers.co.za This test is not yet approved or cleared by the [place] FDA and has been authorized for detection and/or diagnosis of SARS-CoV-2 by FDA under an Emergency Use Authorization (EUA).  This EUA will remain in effect (meaning this test can be used) for the duration of the COVID-19 declaration under Section 564(b)(1) of the Act, 21 U.S.C. section 360bbb-3(b)(1), unless the authorization is terminated  or revoked sooner. Performed at [facility], [place] [address] [place], [place] [address]      Labs: BNP (last 3 results) No results for input(s): BNP in the last 8760 hours. Basic Metabolic Panel: Recent Labs  Lab 04/15/19 1218 04/16/19 0308 04/17/19 0304 04/18/19 0318  NA 137 136 136 133*  K 3.7 3.8 4.0 4.0  CL 107 110 108 105  CO2 22 18* 20* 21*  GLUCOSE 112* 93 108* 100*  BUN 30* 26* 22 22  CREATININE 1.18 0.95 0.98 0.83  CALCIUM 9.4 8.5* 8.6* 8.7*   Liver Function Tests: Recent Labs  Lab 04/16/19 0308 04/17/19 0304  AST  17 17  ALT 15 15  ALKPHOS 35* 37*  BILITOT 1.7* 1.6*  PROT 5.2* 5.1*  ALBUMIN 3.1* 2.9*   No results for input(s): LIPASE, AMYLASE in the last 168 hours. No results for input(s): AMMONIA in the last 168 hours. CBC: Recent Labs  Lab 04/15/19 1218 04/16/19 0308 04/17/19 0304 04/18/19 0318 04/19/19 0307  WBC 11.1* 6.4 8.3 7.4 7.8  HGB 11.9* 9.9* 9.4* 9.3* 9.7*  HCT 37.6* 31.4* 30.2* 29.5* 30.6*  MCV 93.3 94.3 96.5 94.9 93.0  PLT 192 157 144* 157 186   Cardiac Enzymes: No results for input(s): CKTOTAL, CKMB, CKMBINDEX, TROPONINI in the last 168 hours. BNP: Invalid input(s): POCBNP CBG: No results for input(s): GLUCAP in the last 168 hours. D-Dimer No results for input(s): DDIMER in the last 72 hours. Hgb A1c No results for input(s): HGBA1C in the last 72 hours. Lipid Profile No results for input(s): CHOL, HDL, LDLCALC, TRIG, CHOLHDL, LDLDIRECT in the last 72 hours. Thyroid function studies No results for input(s): TSH, T4TOTAL, T3FREE, THYROIDAB in the last 72 hours.  Invalid input(s): FREET3 Anemia work up No results for input(s): VITAMINB12, FOLATE, FERRITIN, TIBC, IRON, RETICCTPCT in the last 72 hours. Urinalysis    Component Value Date/Time   COLORURINE YELLOW 05/04/2018 2140   APPEARANCEUR CLEAR 05/04/2018 2140   LABSPEC 1.028 05/04/2018 2140   PHURINE 6.0 05/04/2018 2140   GLUCOSEU NEGATIVE  05/04/2018 2140   [place] NEGATIVE 05/04/2018 2140   BILIRUBINUR NEGATIVE 05/04/2018 2140   KETONESUR NEGATIVE 05/04/2018 2140   PROTEINUR NEGATIVE 05/04/2018 2140   UROBILINOGEN 2.0 (H) 05/28/2015 0029   NITRITE NEGATIVE 05/04/2018 2140   LEUKOCYTESUR NEGATIVE 05/04/2018 2140   Sepsis Labs Invalid input(s): PROCALCITONIN,  WBC,  LACTICIDVEN Microbiology Recent Results (from the past 240 hour(s))  SARS Coronavirus 2 [facility] order, Performed in [facility] hospital lab) Nasopharyngeal Nasopharyngeal Swab     Status: None   Collection Time: 04/15/19  6:38 PM   Specimen: Nasopharyngeal Swab  Result Value Ref Range Status   SARS Coronavirus 2 NEGATIVE NEGATIVE Final    Comment: (NOTE) If result is NEGATIVE SARS-CoV-2 target nucleic acids are NOT DETECTED. The SARS-CoV-2 RNA is generally detectable in upper and lower  respiratory specimens during the acute phase of infection. The lowest  concentration of SARS-CoV-2 viral copies this assay can detect is 250  copies / mL. A negative result does not preclude SARS-CoV-2 infection  and should not be used as the sole basis for treatment or other  patient management decisions.  A negative result may occur with  improper specimen collection / handling, submission of specimen other  than nasopharyngeal swab, presence of viral mutation(s) within the  areas targeted by this assay, and inadequate number of viral copies  (<250 copies / mL). A negative result must be combined with clinical  observations, patient history, and epidemiological information. If result is POSITIVE SARS-CoV-2 target nucleic acids are DETECTED. The SARS-CoV-2 RNA is generally detectable in upper and lower  respiratory specimens dur ing the acute phase of infection.  Positive  results are indicative of active infection with SARS-CoV-2.  Clinical  correlation with patient history and other diagnostic information is  necessary to determine patient infection status.   Positive results do  not rule out bacterial infection or co-infection with other viruses. If result is PRESUMPTIVE POSTIVE SARS-CoV-2 nucleic acids MAY BE PRESENT.   A presumptive positive result was obtained on the submitted specimen  and confirmed on repeat testing.  While 2019 novel  coronavirus  (SARS-CoV-2) nucleic acids may be present in the submitted sample  additional confirmatory testing may be necessary for epidemiological  and / or clinical management purposes  to differentiate between  SARS-CoV-2 and other Sarbecovirus currently known to infect humans.  If clinically indicated additional testing with an alternate test  methodology [phone]) is advised. The SARS-CoV-2 RNA is generally  detectable in upper and lower respiratory sp ecimens during the acute  phase of infection. The expected result is Negative. Fact Sheet for Patients:  StrictlyIdeas.no Fact Sheet for Healthcare Providers: BankingDealers.co.za This test is not yet approved or cleared by the [place] FDA and has been authorized for detection and/or diagnosis of SARS-CoV-2 by FDA under an Emergency Use Authorization (EUA).  This EUA will remain in effect (meaning this test can be used) for the duration of the COVID-19 declaration under Section 564(b)(1) of the Act, 21 U.S.C. section 360bbb-3(b)(1), unless the authorization is terminated or revoked sooner. Performed at [facility], [place] [address], [place], [place] [address]   Surgical pcr screen     Status: None   Collection Time: 04/15/19  9:39 PM   Specimen: Nasal Mucosa; Nasal Swab  Result Value Ref Range Status   MRSA, PCR NEGATIVE NEGATIVE Final   Staphylococcus aureus NEGATIVE NEGATIVE Final    Comment: (NOTE) The Xpert SA Assay (FDA approved for NASAL specimens in patients 55 years of age and older), is one component of a comprehensive surveillance program. It is not intended to  diagnose infection nor to guide or monitor treatment. Performed at [facility], [place] [address], [place], [place] [address]   SARS Coronavirus 2 [facility] order, Performed in [facility] hospital lab) Nasopharyngeal Nasopharyngeal Swab     Status: None   Collection Time: 04/19/19  2:01 PM   Specimen: Nasopharyngeal Swab  Result Value Ref Range Status   SARS Coronavirus 2 NEGATIVE NEGATIVE Final    Comment: (NOTE) If result is NEGATIVE SARS-CoV-2 target nucleic acids are NOT DETECTED. The SARS-CoV-2 RNA is generally detectable in upper and lower  respiratory specimens during the acute phase of infection. The lowest  concentration of SARS-CoV-2 viral copies this assay can detect is 250  copies / mL. A negative result does not preclude SARS-CoV-2 infection  and should not be used as the sole basis for treatment or other  patient management decisions.  A negative result may occur with  improper specimen collection / handling, submission of specimen other  than nasopharyngeal swab, presence of viral mutation(s) within the  areas targeted by this assay, and inadequate number of viral copies  (<250 copies / mL). A negative result must be combined with clinical  observations, patient history, and epidemiological information. If result is POSITIVE SARS-CoV-2 target nucleic acids are DETECTED. The SARS-CoV-2 RNA is generally detectable in upper and lower  respiratory specimens dur ing the acute phase of infection.  Positive  results are indicative of active infection with SARS-CoV-2.  Clinical  correlation with patient history and other diagnostic information is  necessary to determine patient infection status.  Positive results do  not rule out bacterial infection or co-infection with other viruses. If result is PRESUMPTIVE POSTIVE SARS-CoV-2 nucleic acids MAY BE PRESENT.   A presumptive positive result was obtained on the submitted specimen  and confirmed on repeat  testing.  While 2019 novel coronavirus  (SARS-CoV-2) nucleic acids may be present in the submitted sample  additional confirmatory testing may be necessary for epidemiological  and /  or clinical management purposes  to differentiate between  SARS-CoV-2 and other Sarbecovirus currently known to infect humans.  If clinically indicated additional testing with an alternate test  methodology [phone]) is advised. The SARS-CoV-2 RNA is generally  detectable in upper and lower respiratory sp ecimens during the acute  phase of infection. The expected result is Negative. Fact Sheet for Patients:  StrictlyIdeas.no Fact Sheet for Healthcare Providers: BankingDealers.co.za This test is not yet approved or cleared by the [place] FDA and has been authorized for detection and/or diagnosis of SARS-CoV-2 by FDA under an Emergency Use Authorization (EUA).  This EUA will remain in effect (meaning this test can be used) for the duration of the COVID-19 declaration under Section 564(b)(1) of the Act, 21 U.S.C. section 360bbb-3(b)(1), unless the authorization is terminated or revoked sooner. Performed at [facility], [place] [address] [place], [place] [address]      Time coordinating discharge:  35 minutes  SIGNED:   Georgette Shell, MD  [facility] 04/20/2019, 10:50 AM Pager   If 7PM-7AM, please contact night-coverage [email or web address] Password [email or web address]

## 2019-04-20 NOTE — Progress Notes (Signed)
Patient discharged to [facility] via [facility]. All belongings w/ patient. Family notified. Report called to Foreman at facility.

## 2019-04-24 DIAGNOSIS — Z9181 History of falling: Secondary | ICD-10-CM | POA: Diagnosis not present

## 2019-04-24 DIAGNOSIS — E860 Dehydration: Secondary | ICD-10-CM | POA: Diagnosis not present

## 2019-04-24 DIAGNOSIS — I2510 Atherosclerotic heart disease of native coronary artery without angina pectoris: Secondary | ICD-10-CM | POA: Diagnosis not present

## 2019-04-24 DIAGNOSIS — S72002D Fracture of unspecified part of neck of left femur, subsequent encounter for closed fracture with routine healing: Secondary | ICD-10-CM | POA: Diagnosis not present

## 2019-04-29 DIAGNOSIS — I5022 Chronic systolic (congestive) heart failure: Secondary | ICD-10-CM | POA: Diagnosis not present

## 2019-04-29 DIAGNOSIS — S72002D Fracture of unspecified part of neck of left femur, subsequent encounter for closed fracture with routine healing: Secondary | ICD-10-CM | POA: Diagnosis not present

## 2019-04-29 DIAGNOSIS — Z8719 Personal history of other diseases of the digestive system: Secondary | ICD-10-CM | POA: Diagnosis not present

## 2019-04-29 DIAGNOSIS — D649 Anemia, unspecified: Secondary | ICD-10-CM | POA: Diagnosis not present

## 2019-04-30 DIAGNOSIS — S72142D Displaced intertrochanteric fracture of left femur, subsequent encounter for closed fracture with routine healing: Secondary | ICD-10-CM | POA: Diagnosis not present

## 2019-05-03 DIAGNOSIS — S72002D Fracture of unspecified part of neck of left femur, subsequent encounter for closed fracture with routine healing: Secondary | ICD-10-CM | POA: Diagnosis not present

## 2019-05-03 DIAGNOSIS — Z8719 Personal history of other diseases of the digestive system: Secondary | ICD-10-CM | POA: Diagnosis not present

## 2019-05-03 DIAGNOSIS — D649 Anemia, unspecified: Secondary | ICD-10-CM | POA: Diagnosis not present

## 2019-05-03 DIAGNOSIS — E8809 Other disorders of plasma-protein metabolism, not elsewhere classified: Secondary | ICD-10-CM | POA: Diagnosis not present

## 2019-05-08 DIAGNOSIS — S72002D Fracture of unspecified part of neck of left femur, subsequent encounter for closed fracture with routine healing: Secondary | ICD-10-CM | POA: Diagnosis not present

## 2019-05-08 DIAGNOSIS — Z9181 History of falling: Secondary | ICD-10-CM | POA: Diagnosis not present

## 2019-05-08 DIAGNOSIS — D649 Anemia, unspecified: Secondary | ICD-10-CM | POA: Diagnosis not present

## 2019-05-08 DIAGNOSIS — I110 Hypertensive heart disease with heart failure: Secondary | ICD-10-CM | POA: Diagnosis not present

## 2019-05-15 DIAGNOSIS — M1990 Unspecified osteoarthritis, unspecified site: Secondary | ICD-10-CM | POA: Diagnosis not present

## 2019-05-15 DIAGNOSIS — I2510 Atherosclerotic heart disease of native coronary artery without angina pectoris: Secondary | ICD-10-CM | POA: Diagnosis not present

## 2019-05-15 DIAGNOSIS — I714 Abdominal aortic aneurysm, without rupture: Secondary | ICD-10-CM | POA: Diagnosis not present

## 2019-05-15 DIAGNOSIS — W19XXXA Unspecified fall, initial encounter: Secondary | ICD-10-CM | POA: Diagnosis not present

## 2019-05-15 DIAGNOSIS — C61 Malignant neoplasm of prostate: Secondary | ICD-10-CM | POA: Diagnosis not present

## 2019-05-15 DIAGNOSIS — Z87891 Personal history of nicotine dependence: Secondary | ICD-10-CM | POA: Diagnosis not present

## 2019-05-15 DIAGNOSIS — E785 Hyperlipidemia, unspecified: Secondary | ICD-10-CM | POA: Diagnosis not present

## 2019-05-15 DIAGNOSIS — S72145D Nondisplaced intertrochanteric fracture of left femur, subsequent encounter for closed fracture with routine healing: Secondary | ICD-10-CM | POA: Diagnosis not present

## 2019-05-15 DIAGNOSIS — H9190 Unspecified hearing loss, unspecified ear: Secondary | ICD-10-CM | POA: Diagnosis not present

## 2019-05-15 DIAGNOSIS — D649 Anemia, unspecified: Secondary | ICD-10-CM | POA: Diagnosis not present

## 2019-05-15 DIAGNOSIS — I4510 Unspecified right bundle-branch block: Secondary | ICD-10-CM | POA: Diagnosis not present

## 2019-05-15 DIAGNOSIS — I5022 Chronic systolic (congestive) heart failure: Secondary | ICD-10-CM | POA: Diagnosis not present

## 2019-05-15 DIAGNOSIS — I110 Hypertensive heart disease with heart failure: Secondary | ICD-10-CM | POA: Diagnosis not present

## 2019-05-15 DIAGNOSIS — Z951 Presence of aortocoronary bypass graft: Secondary | ICD-10-CM | POA: Diagnosis not present

## 2019-05-19 DIAGNOSIS — Z7901 Long term (current) use of anticoagulants: Secondary | ICD-10-CM | POA: Diagnosis not present

## 2019-05-19 DIAGNOSIS — K5900 Constipation, unspecified: Secondary | ICD-10-CM | POA: Diagnosis not present

## 2019-05-19 DIAGNOSIS — E8809 Other disorders of plasma-protein metabolism, not elsewhere classified: Secondary | ICD-10-CM | POA: Diagnosis not present

## 2019-05-19 DIAGNOSIS — S72145D Nondisplaced intertrochanteric fracture of left femur, subsequent encounter for closed fracture with routine healing: Secondary | ICD-10-CM | POA: Diagnosis not present

## 2019-05-19 DIAGNOSIS — I5022 Chronic systolic (congestive) heart failure: Secondary | ICD-10-CM | POA: Diagnosis not present

## 2019-05-19 DIAGNOSIS — I2510 Atherosclerotic heart disease of native coronary artery without angina pectoris: Secondary | ICD-10-CM | POA: Diagnosis not present

## 2019-05-19 DIAGNOSIS — M1990 Unspecified osteoarthritis, unspecified site: Secondary | ICD-10-CM | POA: Diagnosis not present

## 2019-05-19 DIAGNOSIS — W19XXXD Unspecified fall, subsequent encounter: Secondary | ICD-10-CM | POA: Diagnosis not present

## 2019-05-19 DIAGNOSIS — I739 Peripheral vascular disease, unspecified: Secondary | ICD-10-CM | POA: Diagnosis not present

## 2019-05-19 DIAGNOSIS — I4510 Unspecified right bundle-branch block: Secondary | ICD-10-CM | POA: Diagnosis not present

## 2019-05-19 DIAGNOSIS — D649 Anemia, unspecified: Secondary | ICD-10-CM | POA: Diagnosis not present

## 2019-05-19 DIAGNOSIS — I110 Hypertensive heart disease with heart failure: Secondary | ICD-10-CM | POA: Diagnosis not present

## 2019-05-19 DIAGNOSIS — K219 Gastro-esophageal reflux disease without esophagitis: Secondary | ICD-10-CM | POA: Diagnosis not present

## 2019-05-21 DIAGNOSIS — S72009D Fracture of unspecified part of neck of unspecified femur, subsequent encounter for closed fracture with routine healing: Secondary | ICD-10-CM | POA: Diagnosis not present

## 2019-05-21 DIAGNOSIS — Z8679 Personal history of other diseases of the circulatory system: Secondary | ICD-10-CM | POA: Diagnosis not present

## 2019-05-21 DIAGNOSIS — D649 Anemia, unspecified: Secondary | ICD-10-CM | POA: Diagnosis not present

## 2019-05-21 DIAGNOSIS — E782 Mixed hyperlipidemia: Secondary | ICD-10-CM | POA: Diagnosis not present

## 2019-05-28 DIAGNOSIS — S72142D Displaced intertrochanteric fracture of left femur, subsequent encounter for closed fracture with routine healing: Secondary | ICD-10-CM | POA: Diagnosis not present

## 2019-05-28 NOTE — Progress Notes (Signed)
Cardiology Office Note:    Date:  05/30/2019   ID:  Jesse [name], DOB 08-10-1927, MRN [id number]  PCP:  [name], MD  Cardiologist:  [name], MD  Electrophysiologist:  None   Referring MD: [name], MD   Chief Complaint: follow up of CAD  History of Present Illness:    Jesse [name] is a 85 y.o. male with a history of CAD s/p CABG in 0000000, chronic systolic CHF, diffuse aneurysmal disease (thoracic aortic aneurysm, iliac aneurysms, and popliteal artery aneurysms) followed by Dr. Donnetta Hutching and Dr. Servando Snare, left leg venous insufficiency (vein harvesting site, and prior upper GI bleed secondary to duodenal ulcer required transfusions who is followed by Dr. Burt Knack and presents today for follow-up of CAD.  Most recent ischemic evaluation was a Myoview in 04/2013 which was low risk with a primarily fixed basal to mid inferior and inferolateral perfusion defect, medium-sized and moderate in intensity as well inferolateral hypokinesis. Most recent Echo from 05/2018 showed LVEF of 40-45% with normal wall motion and grade 1 diastolic dysfunction. EF unchanged from previous Echo.   Patient has done well from a cardiac standpoint the last several years. He was last seen by Richardson Dopp, PA-C, for a virtual visit in 10/2018 at which time he was doing well and denied any cardiac symptoms. He was instructed to follow-up in 6 months.   Patient was admitted from 04/15/2019 to 04/20/2019 for left hip pain following a fall at home.  Per review of hospital notes, patient did not know what caused him to fall however he thinks he slipped on his home shoes and fell.  According to son, patient always checks his feet when he walks.  He was found to have an incomplete nondisplaced intertrochanteric fracture of the proximal left femur and underwent intramedullary fixation.  He was discharged to a SNF.  Patient presents today for follow-up. Patient doing well since discharge.  He was in SNF for about 20 days.  He  is now ambulating with a cane.  No other falls since discharge.  Patient states he is not exactly sure what caused him to fall but he thinks it may have been because his blood pressure was too low.  It was difficult to determine if patient had any dizziness before fall or why exactly it was due to his BP.  He denies any palpitations, chest pain, shortness of breath prior to fall.  He is doing well from a cardiac standpoint.  No chest pain, shortness of breath, orthopnea, PND, palpitations, lightheadedness, dizziness.  Has some chronic lower left leg swelling but he states this is unchanged.  Past Medical History:  Diagnosis Date  . AAA (abdominal aortic aneurysm) ([place])   . Arthritis   . Bronchitis    hx of appro 40 years ago  . Cancer ([place]) 2010   prostate ( 40 Txs. of radiation treatments)  . Coronary artery disease   . Environmental allergies   . GERD (gastroesophageal reflux disease)   . HOH (hard of hearing)    wears bilateral hearing aids  . Hyperlipidemia   . Hypertension   . Kidney stone 1968  . Myocardial infarction ([place]) 1992  . Pneumonia    hx of  . RBBB     Past Surgical History:  Procedure Laterality Date  . ABDOMINAL AORTIC ANEURYSM REPAIR  08-13-1993   Dr Cameron Sprang  . CARDIAC CATHETERIZATION    . COLONOSCOPY W/ POLYPECTOMY    . CORONARY ARTERY BYPASS GRAFT  03-01-1991   Dr. Servando Snare  . ESOPHAGOGASTRODUODENOSCOPY (EGD) WITH PROPOFOL N/A 05/04/2018   Procedure: ESOPHAGOGASTRODUODENOSCOPY (EGD) WITH PROPOFOL;  Surgeon: [name], MD;  Location: [facility] ENDOSCOPY;  Service: Gastroenterology;  Laterality: N/A;  . EYE SURGERY     Detached retina (Left eye); bilateral cataract removal  . FALSE ANEURYSM REPAIR  05/07/2012   Procedure: REPAIR FALSE ANEURYSM;  Surgeon: Rosetta Posner, MD;  Location: [facility] OR;  Service: Vascular;  Laterality: Left;  Repair of Left Femoral Artery Aneurysm  . HERNIA REPAIR    . HOT HEMOSTASIS N/A 05/04/2018   Procedure: HOT HEMOSTASIS (ARGON  PLASMA COAGULATION/BICAP);  Surgeon: [name], MD;  Location: [facility];  Service: Gastroenterology;  Laterality: N/A;  . INTRAMEDULLARY (IM) NAIL INTERTROCHANTERIC Left 04/16/2019   Procedure: INTRAMEDULLARY (IM) NAIL INTERTROCHANTRIC;  Surgeon: Rod Can, MD;  Location: [facility] ORS;  Service: Orthopedics;  Laterality: Left;  . ORIF PATELLA Right 06/09/2015   Procedure: OPEN REDUCTION INTERNAL (ORIF) FIXATION PATELLA;  Surgeon: Marybelle Killings, MD;  Location: [facility];  Service: Orthopedics;  Laterality: Right;  . repair of bowel obstruction  1999   Dr. Dalbert Batman  . repair of ventral hernia  04-20-1994   P. Cameron Sprang MD  . resection femoral artery  03/19/2012   Dr. Curt Jews  . SUBMUCOSAL INJECTION  05/04/2018   Procedure: SUBMUCOSAL INJECTION;  Surgeon: [name], MD;  Location: [facility];  Service: Gastroenterology;;    Current Medications: Current Meds  Medication Sig  . acetaminophen (TYLENOL) 500 MG tablet Take 500 mg by mouth every 6 (six) hours as needed for mild pain or headache.  Marland Kitchen aspirin 81 MG tablet Take 81 mg by mouth daily.  . carboxymethylcellulose (REFRESH PLUS) 0.5 % SOLN Apply 1-2 drops to eye at bedtime.   . carvedilol (COREG) 12.5 MG tablet TAKE 1 TABLET BY MOUTH 2 TIMES DAILY WITH A MEAL  . Cyanocobalamin (B-12 PO) Take 1 tablet by mouth daily.  Marland Kitchen docusate sodium (COLACE) 100 MG capsule Take 2 capsules (200 mg total) by mouth 2 (two) times daily.  . fenofibrate 160 MG tablet Take 1 tablet (160 mg total) by mouth daily.  . hydrocortisone 2.5 % cream Apply 1 application topically as needed (for dry ears).   . loratadine (CLARITIN) 10 MG tablet Take 10 mg by mouth daily.   Marland Kitchen losartan (COZAAR) 50 MG tablet Take 50 mg by mouth daily.  . pantoprazole (PROTONIX) 40 MG tablet Take 40 mg by mouth daily.  . simvastatin (ZOCOR) 40 MG tablet Take 1 tablet (40 mg total) by mouth daily at 6 PM.     Allergies:   Quinolones, Adhesive [tape], and Lactose intolerance (gi)    Social History   Socioeconomic History  . Marital status: Widowed    Spouse name: Not on file  . Number of children: Not on file  . Years of education: Not on file  . Highest education level: Not on file  Occupational History  . Not on file  Social Needs  . Financial resource strain: Not on file  . Food insecurity    Worry: Not on file    Inability: Not on file  . Transportation needs    Medical: Not on file    Non-medical: Not on file  Tobacco Use  . Smoking status: Former Smoker    Years: 20.00    Types: Cigarettes    Quit date: 07/25/1968    Years since quitting: 50.8  . Smokeless tobacco: Never Used  Substance and  Sexual Activity  . Alcohol use: No  . Drug use: No  . Sexual activity: Not on file  Lifestyle  . Physical activity    Days per week: Not on file    Minutes per session: Not on file  . Stress: Not on file  Relationships  . Social Herbalist on phone: Not on file    Gets together: Not on file    Attends religious service: Not on file    Active member of club or organization: Not on file    Attends meetings of clubs or organizations: Not on file    Relationship status: Not on file  Other Topics Concern  . Not on file  Social History Narrative  . Not on file     Family History: The patient's family history includes Cancer in his father, mother, and sister; Diabetes in his sister; Heart attack in his sister; Varicose Veins in his father.  ROS:   Please see the history of present illness.    All other systems reviewed and are negative.  EKGs/Labs/Other Studies Reviewed:    The following studies were reviewed today:  Myoview 05/14/2013: Low-risk nuclear scan without ischemia. Continue medical management. _______________  Echocardiogram 05/25/2018: Study Conclusions: - Left ventricle: The cavity size was normal. Wall thickness was   normal. Systolic function was mildly to moderately reduced. The   estimated ejection fraction was in  the range of 40% to 45%. Wall   motion was normal; there were no regional wall motion   abnormalities. There was an increased relative contribution of   atrial contraction to ventricular filling. Doppler parameters are   consistent with abnormal left ventricular relaxation (grade 1   diastolic dysfunction). Doppler parameters are consistent with   elevated ventricular end-diastolic filling pressure. - Ventricular septum: Septal motion showed paradox. - Mitral valve: Calcified annulus. - Left atrium: The atrium was moderately dilated. _______________  Chest CTA 01/10/2019: Impression: 1. Stable 4.1 cm ascending thoracic aortic and 4.5 cm proximal descending thoracic aortic aneurysms. Recommend annual imaging followup by CTA or MRA. This recommendation follows 2010 ACCF/AHA/AATS/ACR/ASA/SCA/SCAI/SIR/STS/SVM Guidelines for the Diagnosis and Management of Patients with Thoracic Aortic Disease. Circulation. 2010; [id number]. Aortic aneurysm NOS (ICD10-I71.9) 2.  Aortic atherosclerosis (ICD10-I70.0).  EKG:  EKG not ordered today. Most recent EKG from 04/15/2019 reviewed and demonstrates normal sinus rhythm, rate 72, with PVC/PAC and non-specific IVCD.  Recent Labs: 04/17/2019: ALT 15 04/18/2019: BUN 22; Creatinine, Ser 0.83; Potassium 4.0; Sodium 133 04/19/2019: Hemoglobin 9.7; Platelets 186  Recent Lipid Panel    Component Value Date/Time   CHOL 106 03/02/2017 0753   TRIG 57 03/02/2017 0753   TRIG 84 05/05/2006 0839   HDL 32 (L) 03/02/2017 0753   CHOLHDL 3.3 03/02/2017 0753   CHOLHDL 4 05/20/2014 0833   VLDL 12.8 05/20/2014 0833   LDLCALC 63 03/02/2017 0753    Physical Exam:    Vital Signs: BP 110/68   Pulse 66   Ht 6' (1.829 m)   Wt 177 lb 3.2 oz (80.4 kg)   SpO2 99%   BMI 24.03 kg/m     Wt Readings from Last 3 Encounters:  05/30/19 177 lb 3.2 oz (80.4 kg)  04/16/19 177 lb (80.3 kg)  01/10/19 178 lb (80.7 kg)     General: 85 y.o. male resting comfortably in no  acute distress. HEENT: Normocephalic and atraumatic. Sclera clear. EOMs intact. Neck: Supple. No carotid bruits. No JVD. Heart: RRR. Distinct S1 and S2. No murmurs,  gallops, or rubs. Radial pulses 2+ and equal bilaterally. Lungs: No increased work of breathing. Clear to ausculation bilaterally. No wheezes, rhonchi, or rales.  Abdomen: Soft, non-distended, and non-tender to palpation. Bowel sounds present. MSK: Normal strength and tone for age. Extremities: Mild left lower extremity edema around ankle (chronic).   Skin: Warm and dry. Neuro: Alert and oriented x3. No focal deficits. Psych: Normal affect. Responds appropriately.   Assessment:    1. Coronary artery disease involving native coronary artery of native heart without angina pectoris   2. Chronic combined systolic and diastolic CHF (congestive heart failure) ([place])   3. Essential hypertension   4. Hyperlipidemia, unspecified hyperlipidemia type   5. Thoracic aortic aneurysm without rupture ([place])     Plan:    CAD s/p Remote CABG in 1992 - Remote CABG in 1992 with low risk Myoview in 04/2013. - Stable. No angina. - Continue aspirin, beta-blocker, and statin.   Chronic Systolic CHF - Most recent Echo from 05/2018 showed LVEF of 40-45% with normal wall motion and grade 1 diastolic dysfunction.  - Euvolemic on exam. - Patient states he is taking HCTZ 12.5 mg daily although is not on his home list.  BP 110/68 today and patient states his RPP is often in the 100s at home.  Given patient's age and recent fall, advised patient to stop HCTZ at this time.  However, he will let us know if he has worsening shortness of breath or edema with this. - Continue Losartan 50mg  daily and Coreg 12.5mg  twice daily.   Hypertension - BP well controlled at 110/68 today. - Stop HCTZ and continue Coreg and losartan as above as above.  Hyperlipidemia - Most recent LDL 78 in 03/2018 (from [facility]). - LDL goal <70 given CAD.  - Continue Simvastatin 40 mg  daily. - Patient scheduled repeat labs done at PCPs office next week and have asked patient to make sure that lipid panel was also checked.  Diffuse Aneurysmal Disease - Patient has thoracic aortic aneurysm, iliac aneurysms, and popliteal artery aneurysms. - Last CT scan in 12/2018 showed stable 4.2cm ascending thoracic aneurysm and stable 4.5cm proximal descending thoracic aneurysm.  - Followed by Dr. Servando Snare and Dr. Donnetta Hutching.  Recent Fall - Patient recently fell and fractured his left femur.  Per review of hospital notes, sounds like it was a mechanical fall. However, at visit today, patient states he thinks it may have been because his BP was too low.  Hard to determine whether patient was having any dizziness but did fall; however he denied any palpitations or other cardiac symptoms.  No palpitations or dizziness since discharge.  - Do not think any further evaluation potential arrhythmia is necessary at this time.  Disposition: Follow up in 6 months with Dr. Burt Knack.   Medication Adjustments/Labs and Tests Ordered: Current medicines are reviewed at length with the patient today.  Concerns regarding medicines are outlined above.  No orders of the defined types were placed in this encounter.  No orders of the defined types were placed in this encounter.   Patient Instructions  Medication Instructions:  Your physician recommends that you continue on your current medications as directed. Please refer to the Current Medication list given to you today.  1.  STOP the Hydrochlorothiazide   *If you need a refill on your cardiac medications before your next appointment, please call your pharmacy*  Lab Work: None ordered today.  Please ask your primary care dr to check it next week when you  go for labs  If you have labs (blood work) drawn today and your tests are completely normal, you will receive your results only by: Marland Kitchen MyChart Message (if you have MyChart) OR . A paper copy in the mail  If you have any lab test that is abnormal or we need to change your treatment, we will call you to review the results.  Testing/Procedures: None ordered   Follow-Up: At [facility], you and your health needs are our priority.  As part of our continuing mission to provide you with exceptional heart care, we have created designated Provider Care Teams.  These Care Teams include your primary Cardiologist (physician) and Advanced Practice Providers (APPs -  Physician Assistants and Nurse Practitioners) who all work together to provide you with the care you need, when you need it.  Your next appointment:   6 MONTHS   The format for your next appointment:   In Person  Provider:   [name], MD  Other Instructions  .    Signed, Darreld Mclean, PA-C  05/30/2019 4:41 PM    [facility]

## 2019-05-29 ENCOUNTER — Other Ambulatory Visit: Payer: Self-pay | Admitting: Cardiovascular Disease

## 2019-05-29 MED ORDER — CARVEDILOL 12.5 MG PO TABS: ORAL_TABLET | ORAL | 1 refills | Status: DC

## 2019-05-29 MED ORDER — FENOFIBRATE 160 MG PO TABS: 160.0000 mg | ORAL_TABLET | Freq: Every day | ORAL | 1 refills | Status: AC

## 2019-05-29 MED ORDER — SIMVASTATIN 40 MG PO TABS: 40.0000 mg | ORAL_TABLET | Freq: Every day | ORAL | 1 refills | Status: AC

## 2019-05-30 ENCOUNTER — Other Ambulatory Visit: Payer: Self-pay

## 2019-05-30 ENCOUNTER — Telehealth: Payer: Self-pay | Admitting: Cardiovascular Disease

## 2019-05-30 ENCOUNTER — Encounter: Payer: Self-pay | Admitting: Student

## 2019-05-30 ENCOUNTER — Ambulatory Visit (INDEPENDENT_AMBULATORY_CARE_PROVIDER_SITE_OTHER): Payer: Medicare Other | Admitting: Student

## 2019-05-30 VITALS — BP 110/68 | HR 66 | Ht 72.0 in | Wt 177.2 lb

## 2019-05-30 DIAGNOSIS — I5042 Chronic combined systolic (congestive) and diastolic (congestive) heart failure: Secondary | ICD-10-CM | POA: Diagnosis not present

## 2019-05-30 DIAGNOSIS — I712 Thoracic aortic aneurysm, without rupture, unspecified: Secondary | ICD-10-CM

## 2019-05-30 DIAGNOSIS — I2510 Atherosclerotic heart disease of native coronary artery without angina pectoris: Secondary | ICD-10-CM

## 2019-05-30 DIAGNOSIS — E785 Hyperlipidemia, unspecified: Secondary | ICD-10-CM | POA: Diagnosis not present

## 2019-05-30 DIAGNOSIS — I10 Essential (primary) hypertension: Secondary | ICD-10-CM

## 2019-05-30 NOTE — Telephone Encounter (Signed)
Gave the ok for a extended refill.

## 2019-05-30 NOTE — Telephone Encounter (Signed)
Lovena Le from [facility] is calling to see if the patient can get an extended refill on carvedilol (COREG) 12.5 MG tablet

## 2019-05-30 NOTE — Patient Instructions (Addendum)
Medication Instructions:  Your physician recommends that you continue on your current medications as directed. Please refer to the Current Medication list given to you today.  1.  STOP the Hydrochlorothiazide   *If you need a refill on your cardiac medications before your next appointment, please call your pharmacy*  Lab Work: None ordered today.  Please ask your primary care dr to check it next week when you go for labs  If you have labs (blood work) drawn today and your tests are completely normal, you will receive your results only by: Marland Kitchen MyChart Message (if you have MyChart) OR . A paper copy in the mail If you have any lab test that is abnormal or we need to change your treatment, we will call you to review the results.  Testing/Procedures: None ordered   Follow-Up: At [facility], you and your health needs are our priority.  As part of our continuing mission to provide you with exceptional heart care, we have created designated Provider Care Teams.  These Care Teams include your primary Cardiologist (physician) and Advanced Practice Providers (APPs -  Physician Assistants and Nurse Practitioners) who all work together to provide you with the care you need, when you need it.  Your next appointment:   6 MONTHS   The format for your next appointment:   In Person  Provider:   [name], MD  Other Instructions  .

## 2019-06-03 ENCOUNTER — Other Ambulatory Visit: Payer: Self-pay

## 2019-06-03 DIAGNOSIS — D649 Anemia, unspecified: Secondary | ICD-10-CM | POA: Diagnosis not present

## 2019-06-03 DIAGNOSIS — E782 Mixed hyperlipidemia: Secondary | ICD-10-CM | POA: Diagnosis not present

## 2019-06-03 DIAGNOSIS — Z8679 Personal history of other diseases of the circulatory system: Secondary | ICD-10-CM | POA: Diagnosis not present

## 2019-06-03 DIAGNOSIS — S72009D Fracture of unspecified part of neck of unspecified femur, subsequent encounter for closed fracture with routine healing: Secondary | ICD-10-CM | POA: Diagnosis not present

## 2019-06-13 DIAGNOSIS — J019 Acute sinusitis, unspecified: Secondary | ICD-10-CM | POA: Diagnosis not present

## 2019-06-13 DIAGNOSIS — R0981 Nasal congestion: Secondary | ICD-10-CM | POA: Diagnosis not present

## 2019-06-15 ENCOUNTER — Inpatient Hospital Stay (HOSPITAL_COMMUNITY)
Admission: EM | Admit: 2019-06-15 | Discharge: 2019-06-24 | DRG: 480 | Disposition: A | Discharge status: Skilled Nursing Facility | Payer: Medicare Other | Attending: Family Medicine | Admitting: Family Medicine

## 2019-06-15 ENCOUNTER — Emergency Department (HOSPITAL_COMMUNITY): Payer: Medicare Other

## 2019-06-15 ENCOUNTER — Other Ambulatory Visit: Payer: Self-pay

## 2019-06-15 ENCOUNTER — Encounter (HOSPITAL_COMMUNITY): Payer: Self-pay

## 2019-06-15 ENCOUNTER — Inpatient Hospital Stay (HOSPITAL_COMMUNITY): Payer: Medicare Other

## 2019-06-15 DIAGNOSIS — I25810 Atherosclerosis of coronary artery bypass graft(s) without angina pectoris: Secondary | ICD-10-CM | POA: Diagnosis present

## 2019-06-15 DIAGNOSIS — Z66 Do not resuscitate: Secondary | ICD-10-CM | POA: Diagnosis not present

## 2019-06-15 DIAGNOSIS — I739 Peripheral vascular disease, unspecified: Secondary | ICD-10-CM | POA: Diagnosis present

## 2019-06-15 DIAGNOSIS — S72144A Nondisplaced intertrochanteric fracture of right femur, initial encounter for closed fracture: Principal | ICD-10-CM | POA: Diagnosis present

## 2019-06-15 DIAGNOSIS — E785 Hyperlipidemia, unspecified: Secondary | ICD-10-CM | POA: Diagnosis not present

## 2019-06-15 DIAGNOSIS — Y92230 Patient room in hospital as the place of occurrence of the external cause: Secondary | ICD-10-CM | POA: Diagnosis not present

## 2019-06-15 DIAGNOSIS — E782 Mixed hyperlipidemia: Secondary | ICD-10-CM | POA: Diagnosis not present

## 2019-06-15 DIAGNOSIS — D62 Acute posthemorrhagic anemia: Secondary | ICD-10-CM | POA: Diagnosis not present

## 2019-06-15 DIAGNOSIS — M25551 Pain in right hip: Secondary | ICD-10-CM | POA: Diagnosis present

## 2019-06-15 DIAGNOSIS — S72141A Displaced intertrochanteric fracture of right femur, initial encounter for closed fracture: Secondary | ICD-10-CM

## 2019-06-15 DIAGNOSIS — Z8679 Personal history of other diseases of the circulatory system: Secondary | ICD-10-CM | POA: Diagnosis not present

## 2019-06-15 DIAGNOSIS — E7800 Pure hypercholesterolemia, unspecified: Secondary | ICD-10-CM | POA: Diagnosis not present

## 2019-06-15 DIAGNOSIS — Z87891 Personal history of nicotine dependence: Secondary | ICD-10-CM

## 2019-06-15 DIAGNOSIS — I5042 Chronic combined systolic (congestive) and diastolic (congestive) heart failure: Secondary | ICD-10-CM | POA: Diagnosis not present

## 2019-06-15 DIAGNOSIS — J984 Other disorders of lung: Secondary | ICD-10-CM | POA: Diagnosis not present

## 2019-06-15 DIAGNOSIS — Z951 Presence of aortocoronary bypass graft: Secondary | ICD-10-CM | POA: Diagnosis not present

## 2019-06-15 DIAGNOSIS — R52 Pain, unspecified: Secondary | ICD-10-CM | POA: Diagnosis not present

## 2019-06-15 DIAGNOSIS — W010XXA Fall on same level from slipping, tripping and stumbling without subsequent striking against object, initial encounter: Secondary | ICD-10-CM | POA: Diagnosis present

## 2019-06-15 DIAGNOSIS — Z7982 Long term (current) use of aspirin: Secondary | ICD-10-CM

## 2019-06-15 DIAGNOSIS — M6281 Muscle weakness (generalized): Secondary | ICD-10-CM | POA: Diagnosis not present

## 2019-06-15 DIAGNOSIS — D638 Anemia in other chronic diseases classified elsewhere: Secondary | ICD-10-CM | POA: Diagnosis present

## 2019-06-15 DIAGNOSIS — T380X5A Adverse effect of glucocorticoids and synthetic analogues, initial encounter: Secondary | ICD-10-CM | POA: Diagnosis not present

## 2019-06-15 DIAGNOSIS — S72341B Displaced spiral fracture of shaft of right femur, initial encounter for open fracture type I or II: Secondary | ICD-10-CM | POA: Diagnosis present

## 2019-06-15 DIAGNOSIS — Z8546 Personal history of malignant neoplasm of prostate: Secondary | ICD-10-CM

## 2019-06-15 DIAGNOSIS — R739 Hyperglycemia, unspecified: Secondary | ICD-10-CM | POA: Diagnosis not present

## 2019-06-15 DIAGNOSIS — Z743 Need for continuous supervision: Secondary | ICD-10-CM | POA: Diagnosis not present

## 2019-06-15 DIAGNOSIS — I10 Essential (primary) hypertension: Secondary | ICD-10-CM | POA: Diagnosis not present

## 2019-06-15 DIAGNOSIS — S72001A Fracture of unspecified part of neck of right femur, initial encounter for closed fracture: Secondary | ICD-10-CM | POA: Diagnosis not present

## 2019-06-15 DIAGNOSIS — I4510 Unspecified right bundle-branch block: Secondary | ICD-10-CM | POA: Diagnosis present

## 2019-06-15 DIAGNOSIS — Z91048 Other nonmedicinal substance allergy status: Secondary | ICD-10-CM

## 2019-06-15 DIAGNOSIS — Z419 Encounter for procedure for purposes other than remedying health state, unspecified: Secondary | ICD-10-CM

## 2019-06-15 DIAGNOSIS — H9193 Unspecified hearing loss, bilateral: Secondary | ICD-10-CM | POA: Diagnosis present

## 2019-06-15 DIAGNOSIS — I110 Hypertensive heart disease with heart failure: Secondary | ICD-10-CM | POA: Diagnosis present

## 2019-06-15 DIAGNOSIS — S72009A Fracture of unspecified part of neck of unspecified femur, initial encounter for closed fracture: Secondary | ICD-10-CM | POA: Diagnosis present

## 2019-06-15 DIAGNOSIS — S72144D Nondisplaced intertrochanteric fracture of right femur, subsequent encounter for closed fracture with routine healing: Secondary | ICD-10-CM | POA: Diagnosis not present

## 2019-06-15 DIAGNOSIS — I2510 Atherosclerotic heart disease of native coronary artery without angina pectoris: Secondary | ICD-10-CM | POA: Diagnosis not present

## 2019-06-15 DIAGNOSIS — W19XXXA Unspecified fall, initial encounter: Secondary | ICD-10-CM | POA: Diagnosis not present

## 2019-06-15 DIAGNOSIS — R278 Other lack of coordination: Secondary | ICD-10-CM | POA: Diagnosis not present

## 2019-06-15 DIAGNOSIS — U071 COVID-19: Secondary | ICD-10-CM | POA: Diagnosis not present

## 2019-06-15 DIAGNOSIS — R279 Unspecified lack of coordination: Secondary | ICD-10-CM | POA: Diagnosis not present

## 2019-06-15 DIAGNOSIS — I25709 Atherosclerosis of coronary artery bypass graft(s), unspecified, with unspecified angina pectoris: Secondary | ICD-10-CM | POA: Diagnosis not present

## 2019-06-15 DIAGNOSIS — M25561 Pain in right knee: Secondary | ICD-10-CM | POA: Diagnosis not present

## 2019-06-15 DIAGNOSIS — Y92008 Other place in unspecified non-institutional (private) residence as the place of occurrence of the external cause: Secondary | ICD-10-CM

## 2019-06-15 DIAGNOSIS — R2689 Other abnormalities of gait and mobility: Secondary | ICD-10-CM | POA: Diagnosis not present

## 2019-06-15 DIAGNOSIS — E871 Hypo-osmolality and hyponatremia: Secondary | ICD-10-CM | POA: Diagnosis not present

## 2019-06-15 DIAGNOSIS — I252 Old myocardial infarction: Secondary | ICD-10-CM | POA: Diagnosis not present

## 2019-06-15 DIAGNOSIS — K219 Gastro-esophageal reflux disease without esophagitis: Secondary | ICD-10-CM | POA: Diagnosis present

## 2019-06-15 DIAGNOSIS — I5022 Chronic systolic (congestive) heart failure: Secondary | ICD-10-CM | POA: Diagnosis present

## 2019-06-15 DIAGNOSIS — Z833 Family history of diabetes mellitus: Secondary | ICD-10-CM

## 2019-06-15 DIAGNOSIS — I959 Hypotension, unspecified: Secondary | ICD-10-CM | POA: Diagnosis not present

## 2019-06-15 DIAGNOSIS — Z79899 Other long term (current) drug therapy: Secondary | ICD-10-CM

## 2019-06-15 DIAGNOSIS — E872 Acidosis: Secondary | ICD-10-CM | POA: Diagnosis not present

## 2019-06-15 DIAGNOSIS — R2681 Unsteadiness on feet: Secondary | ICD-10-CM | POA: Diagnosis not present

## 2019-06-15 DIAGNOSIS — J1282 Pneumonia due to coronavirus disease 2019: Secondary | ICD-10-CM | POA: Diagnosis present

## 2019-06-15 DIAGNOSIS — F419 Anxiety disorder, unspecified: Secondary | ICD-10-CM | POA: Diagnosis present

## 2019-06-15 DIAGNOSIS — J1289 Other viral pneumonia: Secondary | ICD-10-CM | POA: Diagnosis not present

## 2019-06-15 DIAGNOSIS — Z8781 Personal history of (healed) traumatic fracture: Secondary | ICD-10-CM

## 2019-06-15 DIAGNOSIS — R0602 Shortness of breath: Secondary | ICD-10-CM | POA: Diagnosis not present

## 2019-06-15 DIAGNOSIS — Z881 Allergy status to other antibiotic agents status: Secondary | ICD-10-CM

## 2019-06-15 DIAGNOSIS — Z923 Personal history of irradiation: Secondary | ICD-10-CM

## 2019-06-15 DIAGNOSIS — R5381 Other malaise: Secondary | ICD-10-CM | POA: Diagnosis not present

## 2019-06-15 DIAGNOSIS — R41841 Cognitive communication deficit: Secondary | ICD-10-CM | POA: Diagnosis not present

## 2019-06-15 DIAGNOSIS — S8991XA Unspecified injury of right lower leg, initial encounter: Secondary | ICD-10-CM | POA: Diagnosis not present

## 2019-06-15 DIAGNOSIS — Z8249 Family history of ischemic heart disease and other diseases of the circulatory system: Secondary | ICD-10-CM

## 2019-06-15 LAB — CBC
HCT: 36.8 % — ABNORMAL LOW (ref 39.0–52.0)
Hemoglobin: 11.6 g/dL — ABNORMAL LOW (ref 13.0–17.0)
MCH: 29.5 pg (ref 26.0–34.0)
MCHC: 31.5 g/dL (ref 30.0–36.0)
MCV: 93.6 fL (ref 80.0–100.0)
Platelets: 215 10*3/uL (ref 150–400)
RBC: 3.93 MIL/uL — ABNORMAL LOW (ref 4.22–5.81)
RDW: 13.8 % (ref 11.5–15.5)
WBC: 6.5 10*3/uL (ref 4.0–10.5)
nRBC: 0 % (ref 0.0–0.2)

## 2019-06-15 LAB — TYPE AND SCREEN
ABO/RH(D): A POS
Antibody Screen: NEGATIVE

## 2019-06-15 LAB — COMPREHENSIVE METABOLIC PANEL
ALT: 14 U/L (ref 0–44)
AST: 20 U/L (ref 15–41)
Albumin: 3.1 g/dL — ABNORMAL LOW (ref 3.5–5.0)
Alkaline Phosphatase: 54 U/L (ref 38–126)
Anion gap: 6 (ref 5–15)
BUN: 20 mg/dL (ref 8–23)
CO2: 20 mmol/L — ABNORMAL LOW (ref 22–32)
Calcium: 8 mg/dL — ABNORMAL LOW (ref 8.9–10.3)
Chloride: 110 mmol/L (ref 98–111)
Creatinine, Ser: 0.79 mg/dL (ref 0.61–1.24)
GFR calc Af Amer: >60 mL/min (ref >60)
GFR calc non Af Amer: >60 mL/min (ref >60)
Glucose, Bld: 94 mg/dL (ref 70–99)
Potassium: 3.5 mmol/L (ref 3.5–5.1)
Sodium: 136 mmol/L (ref 135–145)
Total Bilirubin: 0.7 mg/dL (ref 0.3–1.2)
Total Protein: 5.6 g/dL — ABNORMAL LOW (ref 6.5–8.1)

## 2019-06-15 MED ORDER — POLYVINYL ALCOHOL 1.4 % OP SOLN
1.0000 [drp] | Freq: Every day | OPHTHALMIC | Status: DC
  Administered 2019-06-16 – 2019-06-22 (×5): 2 [drp] via OPHTHALMIC
  Filled 2019-06-15 (×2): qty 15

## 2019-06-15 MED ORDER — POVIDONE-IODINE 10 % EX SWAB: 2.0000 "application " | Freq: Once | CUTANEOUS | Status: DC

## 2019-06-15 MED ORDER — DOCUSATE SODIUM 100 MG PO CAPS
100.0000 mg | ORAL_CAPSULE | Freq: Two times a day (BID) | ORAL | Status: DC
  Administered 2019-06-20 – 2019-06-24 (×5): 100 mg via ORAL
  Filled 2019-06-15 (×9): qty 1

## 2019-06-15 MED ORDER — SIMVASTATIN 20 MG PO TABS
40.0000 mg | ORAL_TABLET | Freq: Every day | ORAL | Status: DC
  Administered 2019-06-16 – 2019-06-23 (×8): 40 mg via ORAL
  Filled 2019-06-15 (×2): qty 1
  Filled 2019-06-15: qty 2
  Filled 2019-06-15: qty 1
  Filled 2019-06-15 (×3): qty 2
  Filled 2019-06-15: qty 1

## 2019-06-15 MED ORDER — LOSARTAN POTASSIUM 25 MG PO TABS
50.0000 mg | ORAL_TABLET | Freq: Every day | ORAL | Status: DC
  Administered 2019-06-16 – 2019-06-24 (×8): 50 mg via ORAL
  Filled 2019-06-15 (×2): qty 2
  Filled 2019-06-15 (×3): qty 1
  Filled 2019-06-15 (×3): qty 2

## 2019-06-15 MED ORDER — FERROUS SULFATE 325 (65 FE) MG PO TABS
325.0000 mg | ORAL_TABLET | Freq: Every day | ORAL | Status: DC
  Administered 2019-06-16 – 2019-06-23 (×8): 325 mg via ORAL
  Filled 2019-06-15 (×8): qty 1

## 2019-06-15 MED ORDER — SODIUM CHLORIDE 0.9 % IV SOLN
INTRAVENOUS | Status: DC
  Administered 2019-06-15: 1 mL via INTRAVENOUS

## 2019-06-15 MED ORDER — ACETAMINOPHEN 500 MG PO TABS
500.0000 mg | ORAL_TABLET | Freq: Four times a day (QID) | ORAL | Status: DC | PRN
  Administered 2019-06-17 – 2019-06-23 (×5): 500 mg via ORAL
  Filled 2019-06-15 (×5): qty 1

## 2019-06-15 MED ORDER — CHLORHEXIDINE GLUCONATE 4 % EX LIQD: 60.0000 mL | Freq: Once | CUTANEOUS | Status: DC

## 2019-06-15 MED ORDER — CEFAZOLIN SODIUM-DEXTROSE 2-4 GM/100ML-% IV SOLN: 2.0000 g | INTRAVENOUS | Status: DC

## 2019-06-15 MED ORDER — MORPHINE SULFATE (PF) 2 MG/ML IV SOLN
0.5000 mg | INTRAVENOUS | Status: DC | PRN
  Administered 2019-06-16 – 2019-06-17 (×5): 0.5 mg via INTRAVENOUS
  Filled 2019-06-15 (×5): qty 1

## 2019-06-15 MED ORDER — CEFAZOLIN SODIUM-DEXTROSE 2-4 GM/100ML-% IV SOLN
2.0000 g | INTRAVENOUS | Status: DC
  Filled 2019-06-15: qty 100

## 2019-06-15 MED ORDER — HYDROCODONE-ACETAMINOPHEN 5-325 MG PO TABS
1.0000 | ORAL_TABLET | Freq: Four times a day (QID) | ORAL | Status: DC | PRN
  Administered 2019-06-16 – 2019-06-21 (×6): 2 via ORAL
  Administered 2019-06-21 – 2019-06-24 (×3): 1 via ORAL
  Filled 2019-06-15 (×3): qty 2
  Filled 2019-06-15: qty 1
  Filled 2019-06-15: qty 2
  Filled 2019-06-15: qty 1
  Filled 2019-06-15 (×3): qty 2
  Filled 2019-06-15: qty 1

## 2019-06-15 MED ORDER — LORATADINE 10 MG PO TABS
10.0000 mg | ORAL_TABLET | Freq: Every day | ORAL | Status: DC
  Administered 2019-06-16 – 2019-06-24 (×8): 10 mg via ORAL
  Filled 2019-06-15 (×8): qty 1

## 2019-06-15 MED ORDER — METHOCARBAMOL 1000 MG/10ML IJ SOLN
500.0000 mg | Freq: Four times a day (QID) | INTRAVENOUS | Status: DC | PRN
  Administered 2019-06-16: 500 mg via INTRAVENOUS
  Filled 2019-06-15: qty 5
  Filled 2019-06-15: qty 500

## 2019-06-15 MED ORDER — ENSURE PRE-SURGERY PO LIQD
296.0000 mL | Freq: Once | ORAL | Status: AC
  Administered 2019-06-16: 296 mL via ORAL
  Filled 2019-06-15: qty 296

## 2019-06-15 MED ORDER — FENOFIBRATE 160 MG PO TABS
160.0000 mg | ORAL_TABLET | Freq: Every day | ORAL | Status: DC
  Administered 2019-06-16 – 2019-06-24 (×8): 160 mg via ORAL
  Filled 2019-06-15 (×11): qty 1

## 2019-06-15 MED ORDER — PANTOPRAZOLE SODIUM 40 MG PO TBEC
40.0000 mg | DELAYED_RELEASE_TABLET | Freq: Every day | ORAL | Status: DC
  Administered 2019-06-16 – 2019-06-24 (×8): 40 mg via ORAL
  Filled 2019-06-15 (×8): qty 1

## 2019-06-15 MED ORDER — SALINE SPRAY 0.65 % NA SOLN
2.0000 | Freq: Two times a day (BID) | NASAL | Status: DC
  Administered 2019-06-16 – 2019-06-24 (×18): 2 via NASAL
  Filled 2019-06-15 (×2): qty 44

## 2019-06-15 MED ORDER — METHOCARBAMOL 500 MG PO TABS
500.0000 mg | ORAL_TABLET | Freq: Four times a day (QID) | ORAL | Status: DC | PRN
  Administered 2019-06-21 – 2019-06-23 (×7): 500 mg via ORAL
  Filled 2019-06-15 (×9): qty 1

## 2019-06-15 MED ORDER — CHLORHEXIDINE GLUCONATE 4 % EX LIQD
60.0000 mL | Freq: Once | CUTANEOUS | Status: AC
  Administered 2019-06-16: 4 via TOPICAL
  Filled 2019-06-15: qty 60

## 2019-06-15 MED ORDER — CARVEDILOL 12.5 MG PO TABS
12.5000 mg | ORAL_TABLET | Freq: Two times a day (BID) | ORAL | Status: DC
  Administered 2019-06-16 – 2019-06-24 (×15): 12.5 mg via ORAL
  Filled 2019-06-15 (×16): qty 1

## 2019-06-15 MED ORDER — TRANEXAMIC ACID-NACL 1000-0.7 MG/100ML-% IV SOLN
1000.0000 mg | INTRAVENOUS | Status: DC
  Filled 2019-06-15: qty 100

## 2019-06-15 MED ORDER — MORPHINE SULFATE (PF) 4 MG/ML IV SOLN
4.0000 mg | Freq: Once | INTRAVENOUS | Status: AC
  Administered 2019-06-15: 4 mg via INTRAVENOUS
  Filled 2019-06-15: qty 1

## 2019-06-15 NOTE — H&P (Signed)
History and Physical    Jesse [name] [id number] DOB: 06-19-1928 DOA: 06/15/2019  PCP: [name], MD Patient coming from: Home  Chief Complaint: Fall, right hip pain  HPI: Jesse [name] is a 85 y.o. male with medical history significant of AAA, arthritis, history of prostate cancer, CAD status post CABG, chronic systolic congestive heart failure, GERD, hypertension, hyperlipidemia, RBBB presenting to the ED for evaluation of right hip pain after a fall.  Patient states he was walking in his driveway and stepped on a stone.  He then bent down to pick up the stone and as he was trying to throw it away he lost his balance and fell on his right hip.  Since then he is having pain in this area.  He did not hit his head or lose consciousness.  He was not having any lightheadedness/dizziness, chest pain, or shortness of breath prior to the fall.  States he has had problems with his balance for the past few years and 2 months ago fell and fractured his left hip.  ED Course: Afebrile and hemodynamically stable.  No leukocytosis.  Hemoglobin 11.6, above baseline.  SARS-CoV-2 test pending.  Chest x-ray showing stable mild cardiomegaly without overt pulmonary edema.  Stable mild left basilar scarring and no acute consolidative airspace disease.  X-ray showing acute intertrochanteric proximal right femur fracture without significant displacement.  No right hip dislocation.  X-ray of right knee pending.  Ortho planning on taking the patient to the OR in the morning.  Keep n.p.o. after midnight.  Review of Systems:  All systems reviewed and apart from history of presenting illness, are negative.  Past Medical History:  Diagnosis Date  . AAA (abdominal aortic aneurysm) ([place])   . Arthritis   . Bronchitis    hx of appro 40 years ago  . Cancer ([place]) 2010   prostate ( 40 Txs. of radiation treatments)  . Coronary artery disease   . Environmental allergies   . GERD (gastroesophageal reflux disease)   . HOH  (hard of hearing)    wears bilateral hearing aids  . Hyperlipidemia   . Hypertension   . Kidney stone 1968  . Myocardial infarction ([place]) 1992  . Pneumonia    hx of  . RBBB     Past Surgical History:  Procedure Laterality Date  . ABDOMINAL AORTIC ANEURYSM REPAIR  08-13-1993   Dr Cameron Sprang  . CARDIAC CATHETERIZATION    . COLONOSCOPY W/ POLYPECTOMY    . CORONARY ARTERY BYPASS GRAFT  03-01-1991   Dr. Servando Snare  . ESOPHAGOGASTRODUODENOSCOPY (EGD) WITH PROPOFOL N/A 05/04/2018   Procedure: ESOPHAGOGASTRODUODENOSCOPY (EGD) WITH PROPOFOL;  Surgeon: [name], MD;  Location: [facility];  Service: Gastroenterology;  Laterality: N/A;  . EYE SURGERY     Detached retina (Left eye); bilateral cataract removal  . FALSE ANEURYSM REPAIR  05/07/2012   Procedure: REPAIR FALSE ANEURYSM;  Surgeon: Rosetta Posner, MD;  Location: [facility] OR;  Service: Vascular;  Laterality: Left;  Repair of Left Femoral Artery Aneurysm  . HERNIA REPAIR    . HOT HEMOSTASIS N/A 05/04/2018   Procedure: HOT HEMOSTASIS (ARGON PLASMA COAGULATION/BICAP);  Surgeon: [name], MD;  Location: [facility];  Service: Gastroenterology;  Laterality: N/A;  . INTRAMEDULLARY (IM) NAIL INTERTROCHANTERIC Left 04/16/2019   Procedure: INTRAMEDULLARY (IM) NAIL INTERTROCHANTRIC;  Surgeon: Rod Can, MD;  Location: [facility] ORS;  Service: Orthopedics;  Laterality: Left;  . ORIF PATELLA Right 06/09/2015   Procedure: OPEN REDUCTION INTERNAL (ORIF) FIXATION PATELLA;  Surgeon:  Marybelle Killings, MD;  Location: [place];  Service: Orthopedics;  Laterality: Right;  . repair of bowel obstruction  1999   Dr. Dalbert Batman  . repair of ventral hernia  04-20-1994   P. Cameron Sprang MD  . resection femoral artery  03/19/2012   Dr. Curt Jews  . SUBMUCOSAL INJECTION  05/04/2018   Procedure: SUBMUCOSAL INJECTION;  Surgeon: [name], MD;  Location: [facility];  Service: Gastroenterology;;     reports that he quit smoking about 50 years ago. His smoking use  included cigarettes. He quit after 20.00 years of use. He has never used smokeless tobacco. He reports that he does not drink alcohol or use drugs.  Allergies  Allergen Reactions  . Quinolones Other (See Comments)    unknown  . Adhesive [Tape] Itching and Rash  . Lactose Intolerance (Gi) Other (See Comments)    Gas, bloating, stomach indigestion     Family History  Problem Relation Age of Onset  . Cancer Mother   . Cancer Father   . Varicose Veins Father   . Cancer Sister   . Diabetes Sister   . Heart attack Sister     Prior to Admission medications   Medication Sig Start Date End Date Taking? Authorizing Provider  acetaminophen (TYLENOL) 500 MG tablet Take 500 mg by mouth every 6 (six) hours as needed for mild pain or headache.   Yes [provider]  amoxicillin-clavulanate (AUGMENTIN) 875-125 MG tablet Take 1 tablet by mouth 2 (two) times daily.  06/13/19  Yes [provider]  aspirin 81 MG tablet Take 81 mg by mouth daily.   Yes [provider]  carboxymethylcellulose (REFRESH PLUS) 0.5 % SOLN Apply 1-2 drops to eye at bedtime.    Yes [provider]  carvedilol (COREG) 12.5 MG tablet TAKE 1 TABLET BY MOUTH 2 TIMES DAILY WITH A MEAL Patient taking differently: Take 12.5 mg by mouth 2 (two) times daily with a meal.  05/29/19  Yes [name], MD  Cyanocobalamin (B-12 PO) Take 1 tablet by mouth daily.   Yes [provider]  fenofibrate 160 MG tablet Take 1 tablet (160 mg total) by mouth daily. 05/29/19  Yes [name], MD  ferrous sulfate 325 (65 FE) MG tablet Take 325 mg by mouth daily with breakfast.   Yes [provider]  hydrocortisone 2.5 % cream Apply 1 application topically as needed (for dry ears).  08/16/18  Yes [provider]  loratadine (CLARITIN) 10 MG tablet Take 10 mg by mouth daily.    Yes [provider]  losartan (COZAAR) 50 MG tablet Take 50 mg by mouth daily.   Yes [provider]  pantoprazole (PROTONIX) 40 MG tablet Take 40 mg by mouth daily.   Yes [provider]  simvastatin (ZOCOR) 40 MG tablet Take 1 tablet (40 mg total) by mouth daily at 6 PM. 05/29/19  Yes [name], MD  sodium chloride (OCEAN) 0.65 % SOLN nasal spray Place 2 sprays into both nostrils 2 (two) times daily.   Yes [provider]  docusate sodium (COLACE) 100 MG capsule Take 2 capsules (200 mg total) by mouth 2 (two) times daily. Patient not taking: Reported on 06/15/2019 04/20/19   Georgette Shell, MD    Physical Exam: Vitals:   06/15/19 2145 06/15/19 2200 06/15/19 2215 06/15/19 2230  BP: 137/87 (!) 141/91 128/90 (!) 165/95  Pulse: 72 73 75 81  Resp: 19 18 12  (!) 21  Temp:  TempSrc:      SpO2: 95% 96% 94% 95%  Weight:      Height:        Physical Exam  Constitutional: He is oriented to person, place, and time. He appears well-developed and well-nourished. No distress.  HENT:  Head: Normocephalic.  Eyes: Right eye exhibits no discharge. Left eye exhibits no discharge.  Neck: Neck supple.  Cardiovascular: Normal rate, regular rhythm and intact distal pulses.  Pulmonary/Chest: Effort normal and breath sounds normal. No respiratory distress. He has no wheezes. He has no rales.  Anterior lung fields clear to auscultation  Abdominal: Soft. Bowel sounds are normal. He exhibits no distension. There is no abdominal tenderness. There is no guarding.  Musculoskeletal:        General: No edema.     Comments: Right lower extremity slightly shortened and externally rotated  Neurological: He is alert and oriented to person, place, and time.  Skin: Skin is warm and dry. He is not diaphoretic.     Labs on Admission: I have personally reviewed following labs and imaging studies  CBC: Recent Labs  Lab 06/15/19 2046  WBC 6.5  HGB 11.6*  HCT 36.8*  MCV 93.6  PLT 123456   Basic Metabolic Panel: Recent Labs  Lab 06/15/19 2046  NA 136  K 3.5  CL  110  CO2 20*  GLUCOSE 94  BUN 20  CREATININE 0.79  CALCIUM 8.0*   GFR: Estimated Creatinine Clearance: 68.7 mL/min (by C-G formula based on SCr of 0.79 mg/dL). Liver Function Tests: Recent Labs  Lab 06/15/19 2046  AST 20  ALT 14  ALKPHOS 54  BILITOT 0.7  PROT 5.6*  ALBUMIN 3.1*   No results for input(s): LIPASE, AMYLASE in the last 168 hours. No results for input(s): AMMONIA in the last 168 hours. Coagulation Profile: No results for input(s): INR, PROTIME in the last 168 hours. Cardiac Enzymes: No results for input(s): CKTOTAL, CKMB, CKMBINDEX, TROPONINI in the last 168 hours. BNP (last 3 results) No results for input(s): PROBNP in the last 8760 hours. HbA1C: No results for input(s): HGBA1C in the last 72 hours. CBG: No results for input(s): GLUCAP in the last 168 hours. Lipid Profile: No results for input(s): CHOL, HDL, LDLCALC, TRIG, CHOLHDL, LDLDIRECT in the last 72 hours. Thyroid Function Tests: No results for input(s): TSH, T4TOTAL, FREET4, T3FREE, THYROIDAB in the last 72 hours. Anemia Panel: No results for input(s): VITAMINB12, FOLATE, FERRITIN, TIBC, IRON, RETICCTPCT in the last 72 hours. Urine analysis:    Component Value Date/Time   COLORURINE YELLOW 05/04/2018 2140   APPEARANCEUR CLEAR 05/04/2018 2140   LABSPEC 1.028 05/04/2018 2140   PHURINE 6.0 05/04/2018 2140   GLUCOSEU NEGATIVE 05/04/2018 2140   HGBUR NEGATIVE 05/04/2018 2140   BILIRUBINUR NEGATIVE 05/04/2018 2140   [place] NEGATIVE 05/04/2018 2140   PROTEINUR NEGATIVE 05/04/2018 2140   UROBILINOGEN 2.0 (H) 05/28/2015 0029   NITRITE NEGATIVE 05/04/2018 2140   LEUKOCYTESUR NEGATIVE 05/04/2018 2140    Radiological Exams on Admission: Xr Chest Preop 1 View  Result Date: 06/15/2019 CLINICAL DATA:  Right hip fracture EXAM: CHEST  1 VIEW COMPARISON:  04/15/2019 chest radiograph. FINDINGS: Intact sternotomy wires. CABG clips overlie the mediastinum. Stable cardiomediastinal silhouette with mild  cardiomegaly. No pneumothorax. No pleural effusion. No overt pulmonary edema. Mild left basilar scarring, stable. No acute consolidative airspace disease. IMPRESSION: 1. Stable mild cardiomegaly without overt pulmonary edema. 2. Stable mild left basilar scarring. No acute consolidative airspace disease. Electronically Signed   By:  Ilona Sorrel M.D.   On: 06/15/2019 20:19   Dg Knee Right Port  Result Date: 06/15/2019 CLINICAL DATA:  Initial evaluation for acute trauma, fall. Knee pain. EXAM: PORTABLE RIGHT KNEE - 1-2 VIEW COMPARISON:  Prior radiograph from 04/15/2019 FINDINGS: Two cannulated lack fixation screws with cerclage wires again seen traversing the patella. Appearance is stable without hardware complication. No acute fracture or dislocation. No joint effusion. Mild osteoarthritic changes involving the patellofemoral articulation again noted. No visible acute soft tissue injury. Extensive atherosclerotic change noted about the knee with diffusely ectatic versus aneurysm involving the distal femoral and/or popliteal arteries. Osteopenia. IMPRESSION: 1. No acute osseous abnormality. 2. Sequelae of prior ORIF at the patella without hardware complication. 3. Aortic Atherosclerosis (ICD10-I70.0). Diffusely ectatic versus aneurysmal dilatation of the distal femoral and/or popliteal arteries. Finding could be further assessed with dedicated cross-sectional imaging as clinically warranted. Electronically Signed   By: Jeannine Boga M.D.   On: 06/15/2019 22:58   Xr Hip Right  Result Date: 06/15/2019 CLINICAL DATA:  Fall with right hip and pelvic pain EXAM: DG HIP (WITH OR WITHOUT PELVIS) 2-3V RIGHT COMPARISON:  04/15/2019 pelvic and left hip radiographs FINDINGS: Acute intertrochanteric proximal right femur fracture without significant displacement. Fixation hardware partially visualized in the proximal left femur. No pelvic diastasis. Surgical clips overlie bilateral inguinal regions and sacrum. No  suspicious focal osseous lesions. Degenerative changes in the visualized lower lumbar spine. Mild bilateral hip osteoarthritis. No hip dislocation. IMPRESSION: 1. Acute intertrochanteric proximal right femur fracture without significant displacement. No right hip dislocation. 2. Mild bilateral hip osteoarthritis. Electronically Signed   By: Ilona Sorrel M.D.   On: 06/15/2019 19:56    EKG: Independently reviewed.  Sinus rhythm, atypical RBBB.  No acute ischemic changes.  Assessment/Plan Principal Problem:   Hip fracture (HCC) Active Problems:   HLD (hyperlipidemia)   Essential hypertension   CAD, ARTERY BYPASS GRAFT   Chronic systolic heart failure (HCC)   Right hip fracture secondary to a mechanical fall X-ray showing acute intertrochanteric proximal right femur fracture without significant displacement.  No right hip dislocation.  Ortho planning on taking the patient to the OR in the morning. -Keep n.p.o. after midnight -No anticoagulation for VTE prophylaxis at this time -Morphine as needed, Norco as needed -Robaxin as needed -Bowel regimen -Preop cefazolin ordered  Chronic systolic congestive heart failure Currently euvolemic. -Continue Coreg and losartan  CAD status post CABG Stable.  No anginal symptoms. -Continue Coreg and Zocor  Hypertension Stable. -Continue Coreg and losartan  Hyperlipidemia -Continue Zocor and fenofibrate  Chronic anemia -Continue iron supplement  GERD -Continue PPI  DVT prophylaxis: SCDs Code Status: Patient wishes to be DNR Family Communication: No family available. Disposition Plan: Anticipate discharge after clinical improvement. Consults called: Orthopedics Admission status: It is my clinical opinion that admission to [facility] is reasonable and necessary in this 85 y.o. male . presenting with right hip fracture secondary to mechanical fall.  Orthopedics planning on taking him to the OR in the morning.  Given the aforementioned, the  predictability of an adverse outcome is felt to be significant. I expect that the patient will require at least 2 midnights in the hospital to treat this condition.   The medical decision making on this patient was of high complexity and the patient is at high risk for clinical deterioration, therefore this is a level 3 visit.  Shela Leff MD [facility] Pager [phone]  If 7PM-7AM, please contact night-coverage [email or web address] Password [email or web address]  06/15/2019,  11:31 PM

## 2019-06-15 NOTE — ED Triage Notes (Signed)
He states he fell (did not pass out) whilst going out to retrieve his daily mail. He tells me he fell onto his buttocks "on concrete". He c/o pain at right hip/pelvis areas. CMS intact all toes and feet bilat. He rec'd. 100 mcg of IV Fentanyl en route to hospital.

## 2019-06-15 NOTE — ED Notes (Signed)
EKG given to EDP,Steinl,MD., for review. 

## 2019-06-15 NOTE — Progress Notes (Signed)
Patient fell at home today and has R IT femur fx. Known to me for IMN L hip fx on 04/16/19. Dr. Stann Mainland informed me that patient is being admitted by hospitalist. Plan for surgery tomorrow am. NPO after MN. Hold chemical DVT ppx. Full consult to follow tomorrow.

## 2019-06-15 NOTE — ED Provider Notes (Addendum)
[facility] DEPT Provider Note   CSN: [id number] Arrival date & time: 06/15/19  1807     History   Chief Complaint Chief Complaint  Patient presents with   Fall   Hip Pain    HPI Jesse [name] is a 85 y.o. male.     Patient s/p trip and fall with c/o right hip pain. Symptoms acute onset just pta today. Was getting mail, when tripped, falling onto buttocks and hip area. No loc. Unable to ambulate post fall. Pain acute onset post fall, constant, dull, moderate-sev, non radiating. No extremity numbness or weakness. No saddle area numbness. No headache. No neck or back pain. Denies other pain or injury. Skin intact.   The history is provided by the patient and the EMS personnel.  Fall Pertinent negatives include no chest pain, no abdominal pain, no headaches and no shortness of breath.  Hip Pain Pertinent negatives include no chest pain, no abdominal pain, no headaches and no shortness of breath.    Past Medical History:  Diagnosis Date   AAA (abdominal aortic aneurysm) (HCC)    Arthritis    Bronchitis    hx of appro 40 years ago   Cancer ([place]) 2010   prostate ( 40 Txs. of radiation treatments)   Coronary artery disease    Environmental allergies    GERD (gastroesophageal reflux disease)    HOH (hard of hearing)    wears bilateral hearing aids   Hyperlipidemia    Hypertension    Kidney stone 1968   Myocardial infarction ([place]) 1992   Pneumonia    hx of   RBBB     Patient Active Problem List   Diagnosis Date Noted   Surgery, elective    Hip fracture ([place]) 04/15/2019   Fall    Coronary artery disease involving native coronary artery of native heart without angina pectoris 11/20/2018   Thoracic aortic aneurysm without rupture ([place]) [id number]   Chronic systolic heart failure ([place]) 11/20/2018   Duodenal ulcer with hemorrhage 05/05/2018   Hemorrhagic shock ([place]) 05/05/2018   AKI (acute kidney injury) ([place])  05/05/2018   Orthostatic hypotension 05/04/2018   Symptomatic anemia 05/04/2018   Melena 05/04/2018   GI bleed 05/04/2018   Patella fracture 06/09/2015   Aftercare following surgery of the circulatory system, NEC 01/08/2013   Pain in limb- Left popliteal 12/25/2012   Aneurysm artery, 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   Aneurysm of abdominal vessel ([place]) 02/21/2012   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 Servando Snare   ESOPHAGOGASTRODUODENOSCOPY (EGD) WITH PROPOFOL N/A 05/04/2018   Procedure: ESOPHAGOGASTRODUODENOSCOPY (EGD) WITH PROPOFOL;  Surgeon: [name], MD;  Location: [facility];  Service: Gastroenterology;  Laterality: N/A;   EYE SURGERY     Detached retina (Left eye); bilateral cataract removal   FALSE ANEURYSM REPAIR  05/07/2012   Procedure: REPAIR FALSE ANEURYSM;  Surgeon: Rosetta Posner, MD;  Location: [facility];  Service: Vascular;  Laterality: Left;  Repair of Left Femoral Artery Aneurysm   HERNIA REPAIR     HOT HEMOSTASIS N/A 05/04/2018   Procedure: HOT HEMOSTASIS (ARGON PLASMA COAGULATION/BICAP);  Surgeon: [name], MD;  Location: [facility];  Service: Gastroenterology;  Laterality: N/A;   INTRAMEDULLARY (IM) NAIL INTERTROCHANTERIC Left 04/16/2019   Procedure: INTRAMEDULLARY (IM) NAIL INTERTROCHANTRIC;  Surgeon: Rod Can, MD;  Location: [facility];  Service: Orthopedics;   Laterality: Left;   ORIF PATELLA Right 06/09/2015   Procedure: OPEN REDUCTION INTERNAL (ORIF) FIXATION PATELLA;  Surgeon: Marybelle Killings, MD;  Location: [facility];  Service: Orthopedics;  Laterality: Right;   repair of bowel obstruction  1999   Dr. Dalbert Batman   repair of ventral hernia  04-20-1994   P. Cameron Sprang MD   resection femoral artery  03/19/2012   Dr. [name] [place]   [place] INJECTION  05/04/2018   Procedure: SUBMUCOSAL INJECTION;  Surgeon: [name], MD;  Location: [facility];  Service: Gastroenterology;;        Home Medications    Prior to Admission medications   Medication Sig Start Date End Date Taking? Authorizing Provider  acetaminophen (TYLENOL) 500 MG tablet Take 500 mg by mouth every 6 (six) hours as needed for mild pain or headache.    [provider]  aspirin 81 MG tablet Take 81 mg by mouth daily.    [provider]  carboxymethylcellulose (REFRESH PLUS) 0.5 % SOLN Apply 1-2 drops to eye at bedtime.     [provider]  carvedilol (COREG) 12.5 MG tablet TAKE 1 TABLET BY MOUTH 2 TIMES DAILY WITH A MEAL 05/29/19   [name], MD  Cyanocobalamin (B-12 PO) Take 1 tablet by mouth daily.    [provider]  docusate sodium (COLACE) 100 MG capsule Take 2 capsules (200 mg total) by mouth 2 (two) times daily. 04/20/19   Georgette Shell, MD  fenofibrate 160 MG tablet Take 1 tablet (160 mg total) by mouth daily. 05/29/19   [name], MD  hydrocortisone 2.5 % cream Apply 1 application topically as needed (for dry ears).  08/16/18   [provider]  loratadine (CLARITIN) 10 MG tablet Take 10 mg by mouth daily.     [provider]  losartan (COZAAR) 50 MG tablet Take 50 mg by mouth daily.    [provider]  pantoprazole (PROTONIX) 40 MG tablet Take 40 mg by mouth daily.    [provider]  simvastatin (ZOCOR) 40 MG tablet Take 1 tablet (40 mg total) by mouth daily at 6 PM. 05/29/19   [name], MD    Family History Family History  Problem Relation Age of Onset   Cancer Mother    Cancer Father    Varicose Veins Father    Cancer Sister    Diabetes Sister    Heart attack Sister     Social History Social History   Tobacco Use   Smoking status: Former Smoker    Years: 20.00    Types: Cigarettes    Quit date: 07/25/1968    Years since quitting: 50.9   Smokeless tobacco: Never Used  Substance Use Topics   Alcohol use: No   Drug use: No     Allergies   Quinolones, Adhesive [tape], and Lactose intolerance (gi)   Review of Systems Review of Systems  Constitutional: Negative for fever.  HENT: Negative for nosebleeds.   Eyes: Negative for pain, redness and visual disturbance.  Respiratory: Negative for  shortness of breath.   Cardiovascular: Negative for chest pain.  Gastrointestinal: Negative for abdominal pain, nausea and vomiting.  Genitourinary: Negative for flank pain.  Musculoskeletal: Negative for back pain and neck pain.  Skin: Negative for wound.  Neurological: Negative for weakness, numbness and headaches.  Hematological: Does not bruise/bleed easily.  Psychiatric/Behavioral: Negative for confusion.     Physical Exam Updated Vital Signs BP (!) 134/91    Pulse 61    Temp 97.9 F (36.6 C) (Oral)    Resp 15    Ht 1.829 m (6')    Wt 79.4 kg    SpO2 99%    BMI 23.73 kg/m   Physical Exam Vitals signs and nursing note reviewed.  Constitutional:      Appearance: Normal appearance. He is well-developed.  HENT:     Head: Atraumatic.     Nose: Nose normal.     Mouth/Throat:     Mouth: Mucous membranes are moist.     Pharynx: Oropharynx is clear.  Eyes:     General: No scleral icterus.    Conjunctiva/sclera: Conjunctivae normal.     Pupils: Pupils are equal, round, and reactive to light.  Neck:     Musculoskeletal: Normal range of motion and neck supple. No neck rigidity or muscular tenderness.     Trachea: No tracheal deviation.    Cardiovascular:     Rate and Rhythm: Normal rate and regular rhythm.     Pulses: Normal pulses.     Heart sounds: Normal heart sounds. No murmur. No friction rub. No gallop.   Pulmonary:     Effort: Pulmonary effort is normal. No accessory muscle usage or respiratory distress.     Breath sounds: Normal breath sounds.  Chest:     Chest wall: No tenderness.  Abdominal:     General: Bowel sounds are normal. There is no distension.     Palpations: Abdomen is soft.     Tenderness: There is no abdominal tenderness. There is no guarding.  Genitourinary:    Comments: No cva tenderness. Musculoskeletal:        General: No swelling.     Comments: CTLS spine, non tender, aligned, no step off. Tenderness right hip with limited rom due to pain. Distal pulses palp. Otherwise, good rom bil extremities without pain or other focal bony tenderness.   Skin:    General: Skin is warm and dry.     Findings: No rash.  Neurological:     Mental Status: He is alert.     Comments: Alert, speech clear. GCS 15. Motor intact bil, stre 5/5. sens grossly intact bil.   Psychiatric:        Mood and Affect: Mood normal.      ED Treatments / Results  Labs (all labs ordered are listed, but only abnormal results are displayed) Results for orders placed or performed during the hospital encounter of 04/15/19  SARS Coronavirus 2 [facility] order, Performed in [facility] hospital lab) Nasopharyngeal Nasopharyngeal Swab   Specimen: Nasopharyngeal Swab  Result Value Ref Range   SARS Coronavirus 2 NEGATIVE NEGATIVE  Surgical pcr screen   Specimen: Nasal Mucosa; Nasal Swab  Result Value Ref Range   MRSA, PCR NEGATIVE NEGATIVE   Staphylococcus aureus NEGATIVE NEGATIVE  SARS Coronavirus 2 [facility] order, Performed in [facility] hospital lab) Nasopharyngeal Nasopharyngeal Swab   Specimen: Nasopharyngeal Swab  Result Value Ref Range   SARS Coronavirus 2 NEGATIVE NEGATIVE  CBC  Result Value Ref Range  WBC 11.1  (H) 4.0 - 10.5 K/uL   RBC 4.03 (L) 4.22 - 5.81 MIL/uL   Hemoglobin 11.9 (L) 13.0 - 17.0 g/dL   HCT 37.6 (L) 39.0 - 52.0 %   MCV 93.3 80.0 - 100.0 fL   MCH 29.5 26.0 - 34.0 pg   MCHC 31.6 30.0 - 36.0 g/dL   RDW 13.5 11.5 - 15.5 %   Platelets 192 150 - 400 K/uL   nRBC 0.0 0.0 - 0.2 %  Basic metabolic panel  Result Value Ref Range   Sodium 137 135 - 145 mmol/L   Potassium 3.7 3.5 - 5.1 mmol/L   Chloride 107 98 - 111 mmol/L   CO2 22 22 - 32 mmol/L   Glucose, Bld 112 (H) 70 - 99 mg/dL   BUN 30 (H) 8 - 23 mg/dL   Creatinine, Ser 1.18 0.61 - 1.24 mg/dL   Calcium 9.4 8.9 - 10.3 mg/dL   GFR calc non Af Amer 55 (L) >60 mL/min   GFR calc Af Amer >60 >60 mL/min   Anion gap 8 5 - 15  Protime-INR  Result Value Ref Range   Prothrombin Time 15.2 11.4 - 15.2 seconds   INR 1.2 0.8 - 1.2  Comprehensive metabolic panel  Result Value Ref Range   Sodium 136 135 - 145 mmol/L   Potassium 3.8 3.5 - 5.1 mmol/L   Chloride 110 98 - 111 mmol/L   CO2 18 (L) 22 - 32 mmol/L   Glucose, Bld 93 70 - 99 mg/dL   BUN 26 (H) 8 - 23 mg/dL   Creatinine, Ser 0.95 0.61 - 1.24 mg/dL   Calcium 8.5 (L) 8.9 - 10.3 mg/dL   Total Protein 5.2 (L) 6.5 - 8.1 g/dL   Albumin 3.1 (L) 3.5 - 5.0 g/dL   AST 17 15 - 41 U/L   ALT 15 0 - 44 U/L   Alkaline Phosphatase 35 (L) 38 - 126 U/L   Total Bilirubin 1.7 (H) 0.3 - 1.2 mg/dL   GFR calc non Af Amer >60 >60 mL/min   GFR calc Af Amer >60 >60 mL/min   Anion gap 8 5 - 15  CBC  Result Value Ref Range   WBC 6.4 4.0 - 10.5 K/uL   RBC 3.33 (L) 4.22 - 5.81 MIL/uL   Hemoglobin 9.9 (L) 13.0 - 17.0 g/dL   HCT 31.4 (L) 39.0 - 52.0 %   MCV 94.3 80.0 - 100.0 fL   MCH 29.7 26.0 - 34.0 pg   MCHC 31.5 30.0 - 36.0 g/dL   RDW 13.6 11.5 - 15.5 %   Platelets 157 150 - 400 K/uL   nRBC 0.0 0.0 - 0.2 %  Protime-INR  Result Value Ref Range   Prothrombin Time 16.6 (H) 11.4 - 15.2 seconds   INR 1.4 (H) 0.8 - 1.2  Comprehensive metabolic panel  Result Value Ref Range   Sodium 136 135 - 145  mmol/L   Potassium 4.0 3.5 - 5.1 mmol/L   Chloride 108 98 - 111 mmol/L   CO2 20 (L) 22 - 32 mmol/L   Glucose, Bld 108 (H) 70 - 99 mg/dL   BUN 22 8 - 23 mg/dL   Creatinine, Ser 0.98 0.61 - 1.24 mg/dL   Calcium 8.6 (L) 8.9 - 10.3 mg/dL   Total Protein 5.1 (L) 6.5 - 8.1 g/dL   Albumin 2.9 (L) 3.5 - 5.0 g/dL   AST 17 15 - 41 U/L   ALT 15 0 - 44 U/L  Alkaline Phosphatase 37 (L) 38 - 126 U/L   Total Bilirubin 1.6 (H) 0.3 - 1.2 mg/dL   GFR calc non Af Amer >60 >60 mL/min   GFR calc Af Amer >60 >60 mL/min   Anion gap 8 5 - 15  CBC  Result Value Ref Range   WBC 8.3 4.0 - 10.5 K/uL   RBC 3.13 (L) 4.22 - 5.81 MIL/uL   Hemoglobin 9.4 (L) 13.0 - 17.0 g/dL   HCT 30.2 (L) 39.0 - 52.0 %   MCV 96.5 80.0 - 100.0 fL   MCH 30.0 26.0 - 34.0 pg   MCHC 31.1 30.0 - 36.0 g/dL   RDW 13.4 11.5 - 15.5 %   Platelets 144 (L) 150 - 400 K/uL   nRBC 0.0 0.0 - 0.2 %  CBC  Result Value Ref Range   WBC 7.4 4.0 - 10.5 K/uL   RBC 3.11 (L) 4.22 - 5.81 MIL/uL   Hemoglobin 9.3 (L) 13.0 - 17.0 g/dL   HCT 29.5 (L) 39.0 - 52.0 %   MCV 94.9 80.0 - 100.0 fL   MCH 29.9 26.0 - 34.0 pg   MCHC 31.5 30.0 - 36.0 g/dL   RDW 13.2 11.5 - 15.5 %   Platelets 157 150 - 400 K/uL   nRBC 0.0 0.0 - 0.2 %  Basic metabolic panel  Result Value Ref Range   Sodium 133 (L) 135 - 145 mmol/L   Potassium 4.0 3.5 - 5.1 mmol/L   Chloride 105 98 - 111 mmol/L   CO2 21 (L) 22 - 32 mmol/L   Glucose, Bld 100 (H) 70 - 99 mg/dL   BUN 22 8 - 23 mg/dL   Creatinine, Ser 0.83 0.61 - 1.24 mg/dL   Calcium 8.7 (L) 8.9 - 10.3 mg/dL   GFR calc non Af Amer >60 >60 mL/min   GFR calc Af Amer >60 >60 mL/min   Anion gap 7 5 - 15  CBC  Result Value Ref Range   WBC 7.8 4.0 - 10.5 K/uL   RBC 3.29 (L) 4.22 - 5.81 MIL/uL   Hemoglobin 9.7 (L) 13.0 - 17.0 g/dL   HCT 30.6 (L) 39.0 - 52.0 %   MCV 93.0 80.0 - 100.0 fL   MCH 29.5 26.0 - 34.0 pg   MCHC 31.7 30.0 - 36.0 g/dL   RDW 12.9 11.5 - 15.5 %   Platelets 186 150 - 400 K/uL   nRBC 0.0 0.0 - 0.2 %    Type and screen [place]  Result Value Ref Range   ABO/RH(D) A POS    Antibody Screen NEG    Sample Expiration      04/19/2019,2359 Performed at [facility] [address], [place], [place] [address]   Troponin I (High Sensitivity)  Result Value Ref Range   Troponin I (High Sensitivity) 8 <18 ng/L  Troponin I (High Sensitivity)  Result Value Ref Range   Troponin I (High Sensitivity) 10 <18 ng/L   Xr Chest Preop 1 View  Result Date: 06/15/2019 CLINICAL DATA:  Right hip fracture EXAM: CHEST  1 VIEW COMPARISON:  04/15/2019 chest radiograph. FINDINGS: Intact sternotomy wires. CABG clips overlie the mediastinum. Stable cardiomediastinal silhouette with mild cardiomegaly. No pneumothorax. No pleural effusion. No overt pulmonary edema. Mild left basilar scarring, stable. No acute consolidative airspace disease. IMPRESSION: 1. Stable mild cardiomegaly without overt pulmonary edema. 2. Stable mild left basilar scarring. No acute consolidative airspace disease. Electronically Signed   By: Ilona Sorrel  M.D.   On: 06/15/2019 20:19   Xr Hip Right  Result Date: 06/15/2019 CLINICAL DATA:  Fall with right hip and pelvic pain EXAM: DG HIP (WITH OR WITHOUT PELVIS) 2-3V RIGHT COMPARISON:  04/15/2019 pelvic and left hip radiographs FINDINGS: Acute intertrochanteric proximal right femur fracture without significant displacement. Fixation hardware partially visualized in the proximal left femur. No pelvic diastasis. Surgical clips overlie bilateral inguinal regions and sacrum. No suspicious focal osseous lesions. Degenerative changes in the visualized lower lumbar spine. Mild bilateral hip osteoarthritis. No hip dislocation. IMPRESSION: 1. Acute intertrochanteric proximal right femur fracture without significant displacement. No right hip dislocation. 2. Mild bilateral hip osteoarthritis. Electronically Signed   By: Ilona Sorrel M.D.   On: 06/15/2019 19:56     EKG None  Radiology No results found.  Procedures Procedures (including critical care time)  Medications Ordered in ED Medications - No data to display   Initial Impression / Assessment and Plan / ED Course  I have reviewed the triage vital signs and the nursing notes.  Pertinent labs & imaging results that were available during my care of the patient were reviewed by me and considered in my medical decision making (see chart for details).  EMS gave fentanyl prior to arrival - pain currently controlled. Imaging ordered.   Reviewed nursing notes and prior charts for additional history.   Xrays reviewed/interpreted by me - right hip fracture.  Patient indicates most recent ortho surgery was left femur fx, [facility] ([place]) and would like to use that group. Of note, also had remote right [facility]).   Discussed pt with Dr Stann Mainland, on call for [facility]  - states admit to medicine, they will see in consult. States keep NPO past MN tonight in event that he or one of his partners can fix tomorrow.   On call for [facility] consulted.   Hospitalists consulted for admission.  covid screening sent.   Recheck now does want something for pain. Morphine iv.  Recheck pain controlled/improved.   Distal pulse 2+. No numbness/weakness.  Labs reviewed/interpreted by me - chem normal.    Final Clinical Impressions(s) / ED Diagnoses   Final diagnoses:  None    ED Discharge Orders    None           Lajean Saver, MD 06/15/19 2040

## 2019-06-16 ENCOUNTER — Encounter (HOSPITAL_COMMUNITY): Payer: Self-pay | Admitting: Anesthesiology

## 2019-06-16 ENCOUNTER — Encounter (HOSPITAL_COMMUNITY)
Admission: EM | Disposition: A | Discharge status: Skilled Nursing Facility | Payer: Self-pay | Source: Home / Self Care | Attending: Internal Medicine

## 2019-06-16 ENCOUNTER — Encounter (HOSPITAL_COMMUNITY): Payer: Self-pay

## 2019-06-16 DIAGNOSIS — E785 Hyperlipidemia, unspecified: Secondary | ICD-10-CM

## 2019-06-16 DIAGNOSIS — I5022 Chronic systolic (congestive) heart failure: Secondary | ICD-10-CM

## 2019-06-16 DIAGNOSIS — U071 COVID-19: Secondary | ICD-10-CM

## 2019-06-16 DIAGNOSIS — I25709 Atherosclerosis of coronary artery bypass graft(s), unspecified, with unspecified angina pectoris: Secondary | ICD-10-CM

## 2019-06-16 DIAGNOSIS — I10 Essential (primary) hypertension: Secondary | ICD-10-CM

## 2019-06-16 LAB — SEDIMENTATION RATE: Sed Rate: 9 mm/hr (ref 0–16)

## 2019-06-16 LAB — COMPREHENSIVE METABOLIC PANEL
ALT: 14 U/L (ref 0–44)
AST: 23 U/L (ref 15–41)
Albumin: 3.4 g/dL — ABNORMAL LOW (ref 3.5–5.0)
Alkaline Phosphatase: 56 U/L (ref 38–126)
Anion gap: 9 (ref 5–15)
BUN: 19 mg/dL (ref 8–23)
CO2: 21 mmol/L — ABNORMAL LOW (ref 22–32)
Calcium: 8.2 mg/dL — ABNORMAL LOW (ref 8.9–10.3)
Chloride: 100 mmol/L (ref 98–111)
Creatinine, Ser: 0.84 mg/dL (ref 0.61–1.24)
GFR calc Af Amer: >60 mL/min (ref >60)
GFR calc non Af Amer: >60 mL/min (ref >60)
Glucose, Bld: 93 mg/dL (ref 70–99)
Potassium: 3.5 mmol/L (ref 3.5–5.1)
Sodium: 130 mmol/L — ABNORMAL LOW (ref 135–145)
Total Bilirubin: 1.2 mg/dL (ref 0.3–1.2)
Total Protein: 5.8 g/dL — ABNORMAL LOW (ref 6.5–8.1)

## 2019-06-16 LAB — D-DIMER, QUANTITATIVE: D-Dimer, Quant: 11.74 ug/mL-FEU — ABNORMAL HIGH (ref 0.00–0.50)

## 2019-06-16 LAB — CBC WITH DIFFERENTIAL/PLATELET
Abs Immature Granulocytes: 0.02 10*3/uL (ref 0.00–0.07)
Basophils Absolute: 0 10*3/uL (ref 0.0–0.1)
Basophils Relative: 0 %
Eosinophils Absolute: 0 10*3/uL (ref 0.0–0.5)
Eosinophils Relative: 0 %
HCT: 37.1 % — ABNORMAL LOW (ref 39.0–52.0)
Hemoglobin: 11.7 g/dL — ABNORMAL LOW (ref 13.0–17.0)
Immature Granulocytes: 0 %
Lymphocytes Relative: 13 %
Lymphs Abs: 0.6 10*3/uL — ABNORMAL LOW (ref 0.7–4.0)
MCH: 29.4 pg (ref 26.0–34.0)
MCHC: 31.5 g/dL (ref 30.0–36.0)
MCV: 93.2 fL (ref 80.0–100.0)
Monocytes Absolute: 0.4 10*3/uL (ref 0.1–1.0)
Monocytes Relative: 9 %
Neutro Abs: 3.5 10*3/uL (ref 1.7–7.7)
Neutrophils Relative %: 78 %
Platelets: 217 10*3/uL (ref 150–400)
RBC: 3.98 MIL/uL — ABNORMAL LOW (ref 4.22–5.81)
RDW: 13.9 % (ref 11.5–15.5)
WBC: 4.6 10*3/uL (ref 4.0–10.5)
nRBC: 0 % (ref 0.0–0.2)

## 2019-06-16 LAB — RETICULOCYTES
Immature Retic Fract: 7 % (ref 2.3–15.9)
RBC.: 3.97 MIL/uL — ABNORMAL LOW (ref 4.22–5.81)
Retic Count, Absolute: 28.2 10*3/uL (ref 19.0–186.0)
Retic Ct Pct: 0.7 % (ref 0.4–3.1)

## 2019-06-16 LAB — LACTATE DEHYDROGENASE: LDH: 141 U/L (ref 98–192)

## 2019-06-16 LAB — SURGICAL PCR SCREEN
MRSA, PCR: NEGATIVE
Staphylococcus aureus: NEGATIVE

## 2019-06-16 LAB — LACTIC ACID, PLASMA: Lactic Acid, Venous: 0.9 mmol/L (ref 0.5–1.9)

## 2019-06-16 LAB — MAGNESIUM: Magnesium: 1.8 mg/dL (ref 1.7–2.4)

## 2019-06-16 LAB — IRON AND TIBC
Iron: 41 ug/dL — ABNORMAL LOW (ref 45–182)
Saturation Ratios: 14 % — ABNORMAL LOW (ref 17.9–39.5)
TIBC: 293 ug/dL (ref 250–450)
UIBC: 252 ug/dL

## 2019-06-16 LAB — SARS CORONAVIRUS 2 (TAT 6-24 HRS): SARS Coronavirus 2: POSITIVE — AB

## 2019-06-16 LAB — FERRITIN: Ferritin: 261 ng/mL (ref 24–336)

## 2019-06-16 LAB — FOLATE: Folate: 11.7 ng/mL (ref >5.9)

## 2019-06-16 LAB — VITAMIN B12: Vitamin B-12: 1172 pg/mL — ABNORMAL HIGH (ref 180–914)

## 2019-06-16 LAB — PROCALCITONIN: Procalcitonin: <0.1 ng/mL

## 2019-06-16 LAB — C-REACTIVE PROTEIN: CRP: 3.7 mg/dL — ABNORMAL HIGH (ref <1.0)

## 2019-06-16 LAB — FIBRINOGEN: Fibrinogen: 315 mg/dL (ref 210–475)

## 2019-06-16 LAB — PHOSPHORUS: Phosphorus: 2.1 mg/dL — ABNORMAL LOW (ref 2.5–4.6)

## 2019-06-16 SURGERY — FIXATION, FRACTURE, INTERTROCHANTERIC, WITH INTRAMEDULLARY ROD
Anesthesia: Choice | Laterality: Right

## 2019-06-16 MED ORDER — SODIUM PHOSPHATES 45 MMOLE/15ML IV SOLN: 10.0000 mmol | Freq: Once | INTRAVENOUS | Status: DC

## 2019-06-16 MED ORDER — ZINC SULFATE 220 (50 ZN) MG PO CAPS
220.0000 mg | ORAL_CAPSULE | Freq: Every day | ORAL | Status: DC
  Administered 2019-06-16 – 2019-06-24 (×8): 220 mg via ORAL
  Filled 2019-06-16 (×8): qty 1

## 2019-06-16 MED ORDER — VITAMIN C 500 MG PO TABS
500.0000 mg | ORAL_TABLET | Freq: Every day | ORAL | Status: DC
  Administered 2019-06-16 – 2019-06-24 (×8): 500 mg via ORAL
  Filled 2019-06-16 (×8): qty 1

## 2019-06-16 MED ORDER — SODIUM PHOSPHATES 45 MMOLE/15ML IV SOLN
20.0000 mmol | Freq: Once | INTRAVENOUS | Status: AC
  Administered 2019-06-16: 20 mmol via INTRAVENOUS
  Filled 2019-06-16: qty 6.67

## 2019-06-16 MED ORDER — CHLORHEXIDINE GLUCONATE CLOTH 2 % EX PADS
6.0000 | MEDICATED_PAD | Freq: Every day | CUTANEOUS | Status: DC
  Administered 2019-06-16 – 2019-06-17 (×2): 6 via TOPICAL

## 2019-06-16 MED ORDER — DEXAMETHASONE SODIUM PHOSPHATE 10 MG/ML IJ SOLN
10.0000 mg | Freq: Once | INTRAMUSCULAR | Status: AC
  Administered 2019-06-16: 14:00:00 10 mg via INTRAVENOUS
  Filled 2019-06-16: qty 1

## 2019-06-16 MED ORDER — DEXAMETHASONE SODIUM PHOSPHATE 10 MG/ML IJ SOLN
6.0000 mg | INTRAMUSCULAR | Status: DC
  Administered 2019-06-17 – 2019-06-24 (×9): 6 mg via INTRAVENOUS
  Filled 2019-06-16 (×8): qty 1

## 2019-06-16 MED ORDER — HYDROCOD POLST-CPM POLST ER 10-8 MG/5ML PO SUER: 5.0000 mL | Freq: Two times a day (BID) | ORAL | Status: DC | PRN

## 2019-06-16 MED ORDER — ALBUTEROL SULFATE HFA 108 (90 BASE) MCG/ACT IN AERS
1.0000 | INHALATION_SPRAY | RESPIRATORY_TRACT | Status: DC | PRN
  Filled 2019-06-16: qty 6.7

## 2019-06-16 MED ORDER — GUAIFENESIN-DM 100-10 MG/5ML PO SYRP: 5.0000 mL | ORAL_SOLUTION | ORAL | Status: DC | PRN

## 2019-06-16 NOTE — Progress Notes (Signed)
Per Surgery nurse in [place], patients surgery has been put off until tomorrow 11/23. Will update patient.

## 2019-06-16 NOTE — Anesthesia Preprocedure Evaluation (Deleted)
Anesthesia Evaluation  Patient identified by MRN, date of birth, ID band Patient awake    Reviewed: Allergy & Precautions, H&P , NPO status , Patient's Chart, lab work & pertinent test results  Airway Mallampati: II  TM Distance: >3 FB Neck ROM: full    Dental  (+) Teeth Intact, Dental Advisory Given, Caps, Implants,    Pulmonary neg shortness of breath, neg COPD, neg recent URI, former smoker,  20 pack year history, quit smoking [place] 06/15/19  About 1 week ago had productive cough.  Lungs bilat. Clear, w/ decrease in bases  breath sounds clear to auscultation       Cardiovascular hypertension, Pt. on medications + CAD, + Past MI, + Peripheral Vascular Disease and +CHF   Rhythm:Regular Rate:Normal  Hx abdominal aortic aneurysm repaired 1995  CABG 1992   Last echo 05/2018: - Left ventricle: The cavity size was normal. Wall thickness was   normal. Systolic function was mildly to moderately reduced. The   estimated ejection fraction was in the range of 40% to 45%. Wall   motion was normal; there were no regional wall motion   abnormalities. There was an increased relative contribution of   atrial contraction to ventricular filling. Doppler parameters are   consistent with abnormal left ventricular relaxation (grade 1   diastolic dysfunction). Doppler parameters are consistent with   elevated ventricular end-diastolic filling pressure. - Ventricular septum: Septal motion showed paradox. - Mitral valve: Calcified annulus. - Left atrium: The atrium was moderately dilated.   Neuro/Psych negative neurological ROS  negative psych ROS   GI/Hepatic Neg liver ROS, PUD, GERD  Medicated and Controlled,Hx ventral hernia repair 1995  Hx GIB 2/2 duodenal ulcer   Endo/Other  negative endocrine ROS  Renal/GU Renal InsufficiencyRenal disease   Hx prostate ca    Musculoskeletal  (+) Arthritis , Osteoarthritis,   Poor balance, multiple falls recently. S/p IM nail hip 03/2019. [place] again from standing 06/15/19, with subsequent repeat right hip fx   Abdominal   Peds negative pediatric ROS (+)  Hematology negative hematology ROS (+) anemia , plt 215, hct 36.8   Anesthesia Other Findings    Reproductive/Obstetrics negative OB ROS                             Anesthesia Physical  Anesthesia Plan  ASA: III  Anesthesia Plan: Spinal   Post-op Pain Management:    Induction:   PONV Risk Score and Plan: 1 and Treatment may vary due to age or medical condition, Propofol infusion and TIVA  Airway Management Planned: Natural Airway and Simple Face Mask  Additional Equipment: None  Intra-op Plan:   Post-operative Plan:   Informed Consent: I have reviewed the patients History and Physical, chart, labs and discussed the procedure including the risks, benefits and alternatives for the proposed anesthesia with the patient or authorized representative who has indicated his/her understanding and acceptance.       Plan Discussed with: CRNA  Anesthesia Plan Comments: (Spinal in an attempt to avoid manipulating airway in COVID positive patient)        Anesthesia Quick Evaluation

## 2019-06-16 NOTE — ED Notes (Signed)
ED TO INPATIENT HANDOFF REPORT  ED Nurse Name and Phone #:  Anderson Malta [phone]  S Name/Age/Gender Jesse [name] 85 y.o. male Room/Bed: WA14/WA14  Code Status   Code Status: DNR  Home/SNF/Other Home Patient oriented to: self, place, time and situation Is this baseline? Yes   Triage Complete: Triage complete  Chief Complaint Hip pain  Triage Note He states he fell (did not pass out) whilst going out to retrieve his daily mail. He tells me he fell onto his buttocks "on concrete". He c/o pain at right hip/pelvis areas. CMS intact all toes and feet bilat. He rec'd. 100 mcg of IV Fentanyl en route to hospital.   Allergies Allergies  Allergen Reactions  . Quinolones Other (See Comments)    unknown  . Adhesive [Tape] Itching and Rash  . Lactose Intolerance (Gi) Other (See Comments)    Gas, bloating, stomach indigestion     Level of Care/Admitting Diagnosis ED Disposition    ED Disposition Condition [facility] Area: [place] [[id number]]  Level of Care: Med-Surg [16]  Covid Evaluation: Asymptomatic Screening Protocol (No Symptoms)  [facility]: [id number]  Admitting Physician: Shela Leff [id number]  Attending Physician: Shela Leff MP:[id number]  Estimated length of stay: past midnight tomorrow  Certification:: I certify this patient will need inpatient services for at least 2 midnights  PT Class (Do Not Modify): Inpatient [101]  PT Acc Code (Do Not Modify): Private [1]       B Medical/Surgery History Past Medical History:  Diagnosis Date  . AAA (abdominal aortic aneurysm) ([place])   . Arthritis   . Bronchitis    hx of appro 40 years ago  . Cancer ([place]) 2010   prostate ( 40 Txs. of radiation treatments)  . Coronary artery disease   . Environmental allergies   . GERD (gastroesophageal reflux disease)   . HOH (hard of hearing)    wears bilateral hearing aids  . Hyperlipidemia   . Hypertension   . Kidney  stone 1968  . Myocardial infarction ([place]) 1992  . Pneumonia    hx of  . RBBB    Past Surgical History:  Procedure Laterality Date  . ABDOMINAL AORTIC ANEURYSM REPAIR  08-13-1993   Dr Cameron Sprang  . CARDIAC CATHETERIZATION    . COLONOSCOPY W/ POLYPECTOMY    . CORONARY ARTERY BYPASS GRAFT  03-01-1991   Dr. Servando Snare  . ESOPHAGOGASTRODUODENOSCOPY (EGD) WITH PROPOFOL N/A 05/04/2018   Procedure: ESOPHAGOGASTRODUODENOSCOPY (EGD) WITH PROPOFOL;  Surgeon: [name], MD;  Location: [facility];  Service: Gastroenterology;  Laterality: N/A;  . EYE SURGERY     Detached retina (Left eye); bilateral cataract removal  . FALSE ANEURYSM REPAIR  05/07/2012   Procedure: REPAIR FALSE ANEURYSM;  Surgeon: Rosetta Posner, MD;  [facility];  Service: Vascular;  Laterality: Left;  Repair of Left Femoral Artery Aneurysm  . HERNIA REPAIR    . HOT HEMOSTASIS N/A 05/04/2018   Procedure: HOT HEMOSTASIS (ARGON PLASMA COAGULATION/BICAP);  Surgeon: [name], MD;  Location: [facility];  Service: Gastroenterology;  Laterality: N/A;  . INTRAMEDULLARY (IM) NAIL INTERTROCHANTERIC Left 04/16/2019   Procedure: INTRAMEDULLARY (IM) NAIL INTERTROCHANTRIC;  Surgeon: Rod Can, MD;  Location: [facility]S;  Service: Orthopedics;  Laterality: Left;  . ORIF PATELLA Right 06/09/2015   Procedure: OPEN REDUCTION INTERNAL (ORIF) FIXATION PATELLA;  Surgeon: Marybelle Killings, MD;  Location: [facility];  Service: Orthopedics;  Laterality: Right;  . repair of bowel obstruction  1999   Dr. Dalbert Batman  . repair of ventral hernia  04-20-1994   P. Cameron Sprang MD  . resection femoral artery  03/19/2012   Dr. Curt Jews  . SUBMUCOSAL INJECTION  05/04/2018   Procedure: SUBMUCOSAL INJECTION;  Surgeon: [name], MD;  Location: [facility];  Service: Gastroenterology;;     A IV Location/Drains/Wounds Patient Lines/Drains/Airways Status   Active Line/Drains/Airways    Name:   Placement date:   Placement time:   Site:   Days:    Peripheral IV 06/15/19 Anterior;Distal;Right Forearm   06/15/19    1810    Forearm   1          Intake/Output Last 24 hours  Intake/Output Summary (Last 24 hours) at 06/16/2019 0019 Last data filed at 06/16/2019 0010 Gross per 24 hour  Intake 200 ml  Output -  Net 200 ml    Labs/Imaging Results for orders placed or performed during the hospital encounter of 06/15/19 (from the past 48 hour(s))  CBC     Status: Abnormal   Collection Time: 06/15/19  8:46 PM  Result Value Ref Range   WBC 6.5 4.0 - 10.5 K/uL   RBC 3.93 (L) 4.22 - 5.81 MIL/uL   Hemoglobin 11.6 (L) 13.0 - 17.0 g/dL   HCT 36.8 (L) 39.0 - 52.0 %   MCV 93.6 80.0 - 100.0 fL   MCH 29.5 26.0 - 34.0 pg   MCHC 31.5 30.0 - 36.0 g/dL   RDW 13.8 11.5 - 15.5 %   Platelets 215 150 - 400 K/uL   nRBC 0.0 0.0 - 0.2 %    Comment: Performed at [facility], [place] [address] [place], [place] [address]  CMET     Status: Abnormal   Collection Time: 06/15/19  8:46 PM  Result Value Ref Range   Sodium 136 135 - 145 mmol/L   Potassium 3.5 3.5 - 5.1 mmol/L   Chloride 110 98 - 111 mmol/L   CO2 20 (L) 22 - 32 mmol/L   Glucose, Bld 94 70 - 99 mg/dL   BUN 20 8 - 23 mg/dL   Creatinine, Ser 0.79 0.61 - 1.24 mg/dL   Calcium 8.0 (L) 8.9 - 10.3 mg/dL   Total Protein 5.6 (L) 6.5 - 8.1 g/dL   Albumin 3.1 (L) 3.5 - 5.0 g/dL   AST 20 15 - 41 U/L   ALT 14 0 - 44 U/L   Alkaline Phosphatase 54 38 - 126 U/L   Total Bilirubin 0.7 0.3 - 1.2 mg/dL   GFR calc non Af Amer >60 >60 mL/min   GFR calc Af Amer >60 >60 mL/min   Anion gap 6 5 - 15    Comment: Performed at [facility], [place] [address], [place], [place] [address]  Type and screen     Status: None   Collection Time: 06/15/19  9:00 PM  Result Value Ref Range   ABO/RH(D) A POS    Antibody Screen NEG    Sample Expiration      06/18/2019,2359 Performed at [facility], [place] [address], [place], [place] [address]    Xr Chest Preop 1  View  Result Date: 06/15/2019 CLINICAL DATA:  Right hip fracture EXAM: CHEST  1 VIEW COMPARISON:  04/15/2019 chest radiograph. FINDINGS: Intact sternotomy wires. CABG clips overlie the mediastinum. Stable cardiomediastinal silhouette with mild cardiomegaly. No pneumothorax. No pleural effusion. No overt pulmonary edema. Mild left basilar scarring, stable. No acute consolidative airspace disease.  IMPRESSION: 1. Stable mild cardiomegaly without overt pulmonary edema. 2. Stable mild left basilar scarring. No acute consolidative airspace disease. Electronically Signed   By: Ilona Sorrel M.D.   On: 06/15/2019 20:19   Dg Knee Right Port  Result Date: 06/15/2019 CLINICAL DATA:  Initial evaluation for acute trauma, fall. Knee pain. EXAM: PORTABLE RIGHT KNEE - 1-2 VIEW COMPARISON:  Prior radiograph from 04/15/2019 FINDINGS: Two cannulated lack fixation screws with cerclage wires again seen traversing the patella. Appearance is stable without hardware complication. No acute fracture or dislocation. No joint effusion. Mild osteoarthritic changes involving the patellofemoral articulation again noted. No visible acute soft tissue injury. Extensive atherosclerotic change noted about the knee with diffusely ectatic versus aneurysm involving the distal femoral and/or popliteal arteries. Osteopenia. IMPRESSION: 1. No acute osseous abnormality. 2. Sequelae of prior ORIF at the patella without hardware complication. 3. Aortic Atherosclerosis (ICD10-I70.0). Diffusely ectatic versus aneurysmal dilatation of the distal femoral and/or popliteal arteries. Finding could be further assessed with dedicated cross-sectional imaging as clinically warranted. Electronically Signed   By: Jeannine Boga M.D.   On: 06/15/2019 22:58   Xr Hip Right  Result Date: 06/15/2019 CLINICAL DATA:  Fall with right hip and pelvic pain EXAM: DG HIP (WITH OR WITHOUT PELVIS) 2-3V RIGHT COMPARISON:  04/15/2019 pelvic and left hip radiographs  FINDINGS: Acute intertrochanteric proximal right femur fracture without significant displacement. Fixation hardware partially visualized in the proximal left femur. No pelvic diastasis. Surgical clips overlie bilateral inguinal regions and sacrum. No suspicious focal osseous lesions. Degenerative changes in the visualized lower lumbar spine. Mild bilateral hip osteoarthritis. No hip dislocation. IMPRESSION: 1. Acute intertrochanteric proximal right femur fracture without significant displacement. No right hip dislocation. 2. Mild bilateral hip osteoarthritis. Electronically Signed   By: Ilona Sorrel M.D.   On: 06/15/2019 19:56    Pending Labs Unresulted Labs (From admission, onward)    Start     Ordered   06/15/19 2034  SARS CORONAVIRUS 2 (TAT 6-24 HRS) Nasopharyngeal Nasopharyngeal Swab  (Asymptomatic/Tier 3)  Once,   STAT    Question Answer Comment  Is this test for diagnosis or screening Screening   Symptomatic for COVID-19 as defined by CDC No   Hospitalized for COVID-19 No   Admitted to ICU for COVID-19 No   Previously tested for COVID-19 Yes   Resident in a congregate (group) care setting No   Employed in healthcare setting No      06/15/19 2033          Vitals/Pain Today's Vitals   06/15/19 2300 06/15/19 2330 06/16/19 0000 06/16/19 0017  BP: (!) 145/92 135/84 126/81   Pulse: 95 82 84   Resp: 18 (!) 22 20   Temp:      TempSrc:      SpO2: 90% 92% 93%   Weight:      Height:      PainSc:    5     Isolation Precautions No active isolations  Medications Medications  chlorhexidine (HIBICLENS) 4 % liquid 4 application (has no administration in time range)  ceFAZolin (ANCEF) IVPB 2g/100 mL premix (has no administration in time range)  feeding supplement (ENSURE PRE-SURGERY) liquid 296 mL (has no administration in time range)  povidone-iodine 10 % swab 2 application (has no administration in time range)  tranexamic acid (CYKLOKAPRON) IVPB 1,000 mg (has no administration in  time range)  acetaminophen (TYLENOL) tablet 500 mg (has no administration in time range)  carvedilol (COREG) tablet 12.5 mg (has no  administration in time range)  fenofibrate tablet 160 mg (has no administration in time range)  losartan (COZAAR) tablet 50 mg (has no administration in time range)  simvastatin (ZOCOR) tablet 40 mg (has no administration in time range)  ferrous sulfate tablet 325 mg (has no administration in time range)  pantoprazole (PROTONIX) EC tablet 40 mg (has no administration in time range)  loratadine (CLARITIN) tablet 10 mg (has no administration in time range)  sodium chloride (OCEAN) 0.65 % nasal spray 2 spray (has no administration in time range)  carboxymethylcellulose (REFRESH PLUS) 0.5 % ophthalmic solution 1-2 drop (has no administration in time range)  HYDROcodone-acetaminophen (NORCO/VICODIN) 5-325 MG per tablet 1-2 tablet (has no administration in time range)  morphine 2 MG/ML injection 0.5 mg (has no administration in time range)  methocarbamol (ROBAXIN) tablet 500 mg (has no administration in time range)    Or  methocarbamol (ROBAXIN) 500 mg in dextrose 5 % 50 mL IVPB (has no administration in time range)  docusate sodium (COLACE) capsule 100 mg (has no administration in time range)  morphine 4 MG/ML injection 4 mg (4 mg Intravenous Given 06/15/19 1921)    Mobility walks High fall risk   Focused Assessments  R Recommendations: See Admitting Provider Note  Report given to:  Bishnu 5W

## 2019-06-16 NOTE — Progress Notes (Signed)
PROGRESS NOTE    Jesse [name]  NID:[id number] DOB: 1927-08-12 DOA: 06/15/2019 PCP: [name], MD   Brief Narrative:  HPI per Dr. Shela Leff on 06/15/2019 Jesse [name] is a 85 y.o. male with medical history significant of AAA, arthritis, history of prostate cancer, CAD status post CABG, chronic systolic congestive heart failure, GERD, hypertension, hyperlipidemia, RBBB presenting to the ED for evaluation of right hip pain after a fall.  Patient states he was walking in his driveway and stepped on a stone.  He then bent down to pick up the stone and as he was trying to throw it away he lost his balance and fell on his right hip.  Since then he is having pain in this area.  He did not hit his head or lose consciousness.  He was not having any lightheadedness/dizziness, chest pain, or shortness of breath prior to the fall.  States he has had problems with his balance for the past few years and 2 months ago fell and fractured his left hip.  ED Course: Afebrile and hemodynamically stable.  No leukocytosis.  Hemoglobin 11.6, above baseline.  SARS-CoV-2 test pending.  Chest x-ray showing stable mild cardiomegaly without overt pulmonary edema.  Stable mild left basilar scarring and no acute consolidative airspace disease.  X-ray showing acute intertrochanteric proximal right femur fracture without significant displacement.  No right hip dislocation.  X-ray of right knee pending.  Ortho planning on taking the patient to the OR in the morning.  Keep n.p.o. after midnight.  **Interim History  Currently the patient is asymptomatic from his Covid disease and states that he did have some sneezing on Tuesday and Wednesday but went to go see his PCP and was given medications for sinuses.  Surgery has been postponed and will be done on 06/14/2019  Assessment & Plan:   Principal Problem:   Hip fracture ([place]) Active Problems:   HLD (hyperlipidemia)   Essential hypertension   CAD, ARTERY BYPASS GRAFT   Chronic systolic heart failure (HCC)  COVID-19 Infection -SARS-CoV-2 Testing was Positive and likely incidental Finding  -Currently does not have any Respiratory Symptoms and appears Asymptomatic -CXR showed "Stable mild cardiomegaly without overt pulmonary edema. Stable mild left basilar scarring. No acute consolidative airspace disease." -C/w Airborne and Contact Precautions -Check Baseline Inflammatory Markers and trend daily; baseline inflammatory markers obtained and showed an LDH of 141, CRP of 3.7, procalcitonin level of less than 0.10, ESR of 9, D-dimer of 11.74, and fibrinogen of 315 -Supportive Care and will add Zinc and Vitamin C Daily -C/w Antitussives with Tussionex and Robitussin DM -Started Albuterol Inhaler 1-2 puff q4hprn -Will start Decadron 10 mg x1 now and 6 mg daily after -MRSA PCR was Negative  -Repeat CXR in AM   Right hip fracture secondary to a mechanical fall -X-ray showing acute intertrochanteric proximal right femur fracture without significant displacement.  No right hip dislocation.  Ortho planning on taking the patient to the OR in the morning. -Kept n.p.o. after midnight but Surgery will be delayed until tomorrow 11/23 so will give a Cardiac Diet for now  -No anticoagulation for VTE prophylaxis at this time per Ortho request  -C/w Morphine 0.5 mg as needed q2h for Severe Pain, Norco as needed 1-2 tablet q6h -Robaxin as needed -Bowel regimen -Preop cefazolin ordered -Appreciate Ortho Assistance and Further Evaluation and Management   Chronic systolic congestive heart failure -Currently Euvolemic. -Continue Carvedilol 12.5 mg po BID and Losartan 50 mg po Daily  -Hold  on IV fluid hydration at this time but will give IV sodium phosphate 20 mmol -Strict I's and O's and Daily Weights  -Continue to monitor for signs and symptoms of volume overload  CAD status post CABG -Stable. No anginal symptoms. -Continue Carvedilol 12.5 mg po BID, Losartan 50 mg po  daily and Simvastatin 40 mg po qHS   Hypertension -Stable. -Continue Carvedilol 12.5 mg po BID, Losartan 50 mg po daily -BP this Afternoon was 124/68  Hyperlipidemia -Continue Simvastatin 40 mg po qHS and Fenofibrate 160 mg po Daily    Chronic anemia -Continue iron supplement  GERD -Continue PPI with Pantoprazole 40 mg po Daily   Metabolic Acidosis -Mild. -CO2 was 20, Chloride was 110, and AG was 6 -Continue to Monitor and Trend and repeat this AM showed a CO2 of 21, chloride level of 100, anion gap of 9 -Given a dose of sodium phosphate 20 mmol and will hold further fluids at this time -Continue to monitor and repeat CMP in AM  Normocytic Anemia -Paitent's Hb/Hct was 11.6/36.8 on Admission -Repeat this a.m. showed a hemoglobin/hematocrit of 11.7/37.1 -Checked Anemia Panel and showed an iron level of 41, U IBC of 252, TIBC of 293, saturation ratios of 14%, ferritin level 261, folate level 1.7, vitamin B12 level of 1172 -Continue to Monitor for S/Sx of Bleeding; Currently no overt bleeding noted -Repeat CBC in AM  Hyponatremia -Mild and sodium is now 139 -Continue to monitor and trend -He was given IV sodium phosphate 20 mmol -Repeat CMP in a.m.  Hypophosphatemia -Patient's phosphorus level was 2.1 -Replete with IV K-Phos 20 mmol -Continue to monitor and replete as necessary -Repeat phosphorus level in a.m.  DVT prophylaxis: Pharmacological Prophylaxis held in anticipation of Surgery Code Status: DO NOT RESUSCITATE  Family Communication: No family present at bedside Disposition Plan: Pending further work-up and evaluation and surgical intervention by orthopedic surgery  Consultants:   Orthopedic surgery Dr. Lyla Glassing   Procedures: None   Antimicrobials:  Anti-infectives (From admission, onward)   Start     Dose/Rate Route Frequency Ordered Stop   06/16/19 0600  ceFAZolin (ANCEF) IVPB 2g/100 mL premix     2 g 200 mL/hr over 30 Minutes Intravenous On call  to O.R. 06/15/19 2249 06/17/19 0559   06/16/19 0600  ceFAZolin (ANCEF) IVPB 2g/100 mL premix  Status:  Discontinued     2 g 200 mL/hr over 30 Minutes Intravenous On call to O.R. 06/15/19 2252 06/15/19 2308     Subjective: Seen and examined at bedside and he was saying that he was not short of breath or coughing but states that it on Tuesday was a sneezing.  No nausea or vomiting.  States his right hip hurts.  No chest pain.  Anxious about being diagnosed with COVID-19.  No other concerns or complaints at this time.  Objective: Vitals:   06/16/19 0202 06/16/19 0616 06/16/19 0635 06/16/19 0700  BP: 117/83 (!) 156/98  (!) 143/84  Pulse: 76  76 74  Resp: _0 Temp: 99.9 F (37.7 C)     TempSrc: Oral     SpO2: 94% 97% 97% 94%  Weight:      Height:        Intake/Output Summary (Last 24 hours) at 06/16/2019 0742 Last data filed at 06/16/2019 0200 Gross per 24 hour  Intake 200 ml  Output 200 ml  Net 0 ml   Filed Weights   06/15/19 1828  Weight: 79.4 kg   Examination:  Physical Exam:  Constitutional: WN/WD overweight elderly Caucasian male in NAD and appears anxious Eyes: Lids and conjunctivae normal, sclerae anicteric  ENMT: External Ears, Nose appear normal. Grossly normal hearing. Mucous membranes are moist.  Neck: Appears normal, supple, no cervical masses, normal ROM, no appreciable thyromegaly; no JVD Respiratory: Diminished to auscultation bilaterally, no wheezing, rales, rhonchi or crackles. Normal respiratory effort and patient is not tachypenic. No accessory muscle use. Unlabored breathing  Cardiovascular: RRR, no murmurs / rubs / gallops. S1 and S2 auscultated. Slight extremity edema. 2+ pedal pulses. No carotid bruits.  Abdomen: Soft, non-tender, Distended due to body habitus and has prior surgical scar noted along with a palpable mesh hernia. Bowel sounds positive x4.  GU: Deferred. Musculoskeletal: No clubbing / cyanosis of digits/nails. Right leg is shorted and  externally rotated Skin: No rashes, lesions, ulcers on a limited skin evaluation. No induration; Warm and dry.  Neurologic: CN 2-12 grossly intact with no focal deficits. Romberg sign and cerebellar reflexes not assessed.  Psychiatric: Normal judgment and insight. Alert and oriented x 3. Slightly anxious mood and appropriate affect.   Data Reviewed: I have personally reviewed following labs and imaging studies  CBC: Recent Labs  Lab 06/15/19 2046  WBC 6.5  HGB 11.6*  HCT 36.8*  MCV 93.6  PLT 440   Basic Metabolic Panel: Recent Labs  Lab 06/15/19 2046  NA 136  K 3.5  CL 110  CO2 20*  GLUCOSE 94  BUN 20  CREATININE 0.79  CALCIUM 8.0*   GFR: Estimated Creatinine Clearance: 68.7 mL/min (by C-G formula based on SCr of 0.79 mg/dL). Liver Function Tests: Recent Labs  Lab 06/15/19 2046  AST 20  ALT 14  ALKPHOS 54  BILITOT 0.7  PROT 5.6*  ALBUMIN 3.1*   No results for input(s): LIPASE, AMYLASE in the last 168 hours. No results for input(s): AMMONIA in the last 168 hours. Coagulation Profile: No results for input(s): INR, PROTIME in the last 168 hours. Cardiac Enzymes: No results for input(s): CKTOTAL, CKMB, CKMBINDEX, TROPONINI in the last 168 hours. BNP (last 3 results) No results for input(s): PROBNP in the last 8760 hours. HbA1C: No results for input(s): HGBA1C in the last 72 hours. CBG: No results for input(s): GLUCAP in the last 168 hours. Lipid Profile: No results for input(s): CHOL, HDL, LDLCALC, TRIG, CHOLHDL, LDLDIRECT in the last 72 hours. Thyroid Function Tests: No results for input(s): TSH, T4TOTAL, FREET4, T3FREE, THYROIDAB in the last 72 hours. Anemia Panel: No results for input(s): VITAMINB12, FOLATE, FERRITIN, TIBC, IRON, RETICCTPCT in the last 72 hours. Sepsis Labs: No results for input(s): PROCALCITON, LATICACIDVEN in the last 168 hours.  Recent Results (from the past 240 hour(s))  SARS CORONAVIRUS 2 (TAT 6-24 HRS) Nasopharyngeal  Nasopharyngeal Swab     Status: Abnormal   Collection Time: 06/15/19  8:46 PM   Specimen: Nasopharyngeal Swab  Result Value Ref Range Status   SARS Coronavirus 2 POSITIVE (A) NEGATIVE Final    Comment: RESULT CALLED TO, READ BACK BY AND VERIFIED WITH: Alanson Aly, RN AT [phone] ON 06/16/2019 BY SAINVILUS S (NOTE) SARS-CoV-2 target nucleic acids are DETECTED. The SARS-CoV-2 RNA is generally detectable in upper and lower respiratory specimens during the acute phase of infection. Positive results are indicative of active infection with SARS-CoV-2. Clinical  correlation with patient history and other diagnostic information is necessary to determine patient infection status. Positive results do  not rule out bacterial infection or co-infection with other viruses. The expected result is  Negative. Fact Sheet for Patients: SugarRoll.be Fact Sheet for Healthcare Providers: https://www.woods-mathews.com/ This test is not yet approved or cleared by the [place] FDA and  has been authorized for detection and/or diagnosis of SARS-CoV-2 by FDA under an Emergency Use Authorization (EUA). This EUA will remain  in effect (meaning this test  can be used) for the duration of the COVID-19 declaration under Section 564(b)(1) of the Act, 21 U.S.C. section 360bbb-3(b)(1), unless the authorization is terminated or revoked sooner. Performed at [facility], [place] [address] [place], [place] [address]   Surgical pcr screen     Status: None   Collection Time: 06/16/19  1:35 AM   Specimen: Nasal Mucosa; Nasal Swab  Result Value Ref Range Status   MRSA, PCR NEGATIVE NEGATIVE Final   Staphylococcus aureus NEGATIVE NEGATIVE Final    Comment: (NOTE) The Xpert SA Assay (FDA approved for NASAL specimens in patients 25 years of age and older), is one component of a comprehensive surveillance program. It is not intended to diagnose infection nor to guide or  monitor treatment. Performed at [facility], [place] [address], [place], [place] [address]     Radiology Studies: Xr Chest Preop 1 View  Result Date: 06/15/2019 CLINICAL DATA:  Right hip fracture EXAM: CHEST  1 VIEW COMPARISON:  04/15/2019 chest radiograph. FINDINGS: Intact sternotomy wires. CABG clips overlie the mediastinum. Stable cardiomediastinal silhouette with mild cardiomegaly. No pneumothorax. No pleural effusion. No overt pulmonary edema. Mild left basilar scarring, stable. No acute consolidative airspace disease. IMPRESSION: 1. Stable mild cardiomegaly without overt pulmonary edema. 2. Stable mild left basilar scarring. No acute consolidative airspace disease. Electronically Signed   By: Ilona Sorrel M.D.   On: 06/15/2019 20:19   Dg Knee Right Port  Result Date: 06/15/2019 CLINICAL DATA:  Initial evaluation for acute trauma, fall. Knee pain. EXAM: PORTABLE RIGHT KNEE - 1-2 VIEW COMPARISON:  Prior radiograph from 04/15/2019 FINDINGS: Two cannulated lack fixation screws with cerclage wires again seen traversing the patella. Appearance is stable without hardware complication. No acute fracture or dislocation. No joint effusion. Mild osteoarthritic changes involving the patellofemoral articulation again noted. No visible acute soft tissue injury. Extensive atherosclerotic change noted about the knee with diffusely ectatic versus aneurysm involving the distal femoral and/or popliteal arteries. Osteopenia. IMPRESSION: 1. No acute osseous abnormality. 2. Sequelae of prior ORIF at the patella without hardware complication. 3. Aortic Atherosclerosis (ICD10-I70.0). Diffusely ectatic versus aneurysmal dilatation of the distal femoral and/or popliteal arteries. Finding could be further assessed with dedicated cross-sectional imaging as clinically warranted. Electronically Signed   By: Jeannine Boga M.D.   On: 06/15/2019 22:58   Xr Hip Right  Result Date: 06/15/2019 CLINICAL  DATA:  Fall with right hip and pelvic pain EXAM: DG HIP (WITH OR WITHOUT PELVIS) 2-3V RIGHT COMPARISON:  04/15/2019 pelvic and left hip radiographs FINDINGS: Acute intertrochanteric proximal right femur fracture without significant displacement. Fixation hardware partially visualized in the proximal left femur. No pelvic diastasis. Surgical clips overlie bilateral inguinal regions and sacrum. No suspicious focal osseous lesions. Degenerative changes in the visualized lower lumbar spine. Mild bilateral hip osteoarthritis. No hip dislocation. IMPRESSION: 1. Acute intertrochanteric proximal right femur fracture without significant displacement. No right hip dislocation. 2. Mild bilateral hip osteoarthritis. Electronically Signed   By: Ilona Sorrel M.D.   On: 06/15/2019 19:56   Scheduled Meds:  carvedilol  12.5 mg Oral BID WC   docusate sodium  100 mg Oral BID   fenofibrate  160 mg Oral Daily   ferrous sulfate  325 mg Oral Q breakfast   loratadine  10 mg Oral Daily   losartan  50 mg Oral Daily   pantoprazole  40 mg Oral Daily   polyvinyl alcohol  1-2 drop Both Eyes QHS   povidone-iodine  2 application Topical Once   simvastatin  40 mg Oral q1800   sodium chloride  2 spray Each Nare BID   Continuous Infusions:   ceFAZolin (ANCEF) IV     methocarbamol (ROBAXIN) IV 500 mg (06/16/19 0145)   tranexamic acid      LOS: 1 day   Kerney Elbe, DO [facility] PAGER is on AMION  If 7PM-7AM, please contact night-coverage [email or web address]

## 2019-06-16 NOTE — Progress Notes (Signed)
Pt's covid test came positive so transferred pt's to negative pressure room in ICU 1226. On call NP notified. And Notified to daughter Webb Silversmith.

## 2019-06-17 ENCOUNTER — Inpatient Hospital Stay (HOSPITAL_COMMUNITY): Payer: Medicare Other

## 2019-06-17 ENCOUNTER — Encounter (HOSPITAL_COMMUNITY)
Admission: EM | Disposition: A | Discharge status: Skilled Nursing Facility | Payer: Self-pay | Source: Home / Self Care | Attending: Internal Medicine

## 2019-06-17 ENCOUNTER — Inpatient Hospital Stay (HOSPITAL_COMMUNITY): Payer: Medicare Other | Admitting: Certified Registered Nurse Anesthetist

## 2019-06-17 HISTORY — PX: INTRAMEDULLARY (IM) NAIL INTERTROCHANTERIC: SHX5875

## 2019-06-17 LAB — COMPREHENSIVE METABOLIC PANEL
ALT: 14 U/L (ref 0–44)
AST: 20 U/L (ref 15–41)
Albumin: 3.1 g/dL — ABNORMAL LOW (ref 3.5–5.0)
Alkaline Phosphatase: 50 U/L (ref 38–126)
Anion gap: 8 (ref 5–15)
BUN: 24 mg/dL — ABNORMAL HIGH (ref 8–23)
CO2: 21 mmol/L — ABNORMAL LOW (ref 22–32)
Calcium: 9.1 mg/dL (ref 8.9–10.3)
Chloride: 105 mmol/L (ref 98–111)
Creatinine, Ser: 0.79 mg/dL (ref 0.61–1.24)
GFR calc Af Amer: >60 mL/min (ref >60)
GFR calc non Af Amer: >60 mL/min (ref >60)
Glucose, Bld: 180 mg/dL — ABNORMAL HIGH (ref 70–99)
Potassium: 4.2 mmol/L (ref 3.5–5.1)
Sodium: 134 mmol/L — ABNORMAL LOW (ref 135–145)
Total Bilirubin: 0.6 mg/dL (ref 0.3–1.2)
Total Protein: 5.6 g/dL — ABNORMAL LOW (ref 6.5–8.1)

## 2019-06-17 LAB — CBC WITH DIFFERENTIAL/PLATELET
Abs Immature Granulocytes: 0.02 10*3/uL (ref 0.00–0.07)
Basophils Absolute: 0 10*3/uL (ref 0.0–0.1)
Basophils Relative: 0 %
Eosinophils Absolute: 0 10*3/uL (ref 0.0–0.5)
Eosinophils Relative: 0 %
HCT: 35.8 % — ABNORMAL LOW (ref 39.0–52.0)
Hemoglobin: 11.2 g/dL — ABNORMAL LOW (ref 13.0–17.0)
Immature Granulocytes: 1 %
Lymphocytes Relative: 7 %
Lymphs Abs: 0.3 10*3/uL — ABNORMAL LOW (ref 0.7–4.0)
MCH: 29.1 pg (ref 26.0–34.0)
MCHC: 31.3 g/dL (ref 30.0–36.0)
MCV: 93 fL (ref 80.0–100.0)
Monocytes Absolute: 0.2 10*3/uL (ref 0.1–1.0)
Monocytes Relative: 4 %
Neutro Abs: 3.9 10*3/uL (ref 1.7–7.7)
Neutrophils Relative %: 88 %
Platelets: 217 10*3/uL (ref 150–400)
RBC: 3.85 MIL/uL — ABNORMAL LOW (ref 4.22–5.81)
RDW: 13.8 % (ref 11.5–15.5)
WBC: 4.4 10*3/uL (ref 4.0–10.5)
nRBC: 0 % (ref 0.0–0.2)

## 2019-06-17 LAB — PROCALCITONIN: Procalcitonin: <0.1 ng/mL

## 2019-06-17 LAB — SEDIMENTATION RATE: Sed Rate: 6 mm/hr (ref 0–16)

## 2019-06-17 LAB — PHOSPHORUS: Phosphorus: 2.6 mg/dL (ref 2.5–4.6)

## 2019-06-17 LAB — LACTATE DEHYDROGENASE: LDH: 134 U/L (ref 98–192)

## 2019-06-17 LAB — MAGNESIUM: Magnesium: 1.8 mg/dL (ref 1.7–2.4)

## 2019-06-17 LAB — FERRITIN: Ferritin: 216 ng/mL (ref 24–336)

## 2019-06-17 LAB — C-REACTIVE PROTEIN: CRP: 7.6 mg/dL — ABNORMAL HIGH (ref <1.0)

## 2019-06-17 LAB — D-DIMER, QUANTITATIVE: D-Dimer, Quant: 5.62 ug/mL-FEU — ABNORMAL HIGH (ref 0.00–0.50)

## 2019-06-17 LAB — FIBRINOGEN: Fibrinogen: 344 mg/dL (ref 210–475)

## 2019-06-17 SURGERY — FIXATION, FRACTURE, INTERTROCHANTERIC, WITH INTRAMEDULLARY ROD
Anesthesia: General | Site: Hip | Laterality: Right

## 2019-06-17 MED ORDER — ENOXAPARIN SODIUM 40 MG/0.4ML ~~LOC~~ SOLN
40.0000 mg | SUBCUTANEOUS | Status: DC
  Administered 2019-06-18 – 2019-06-21 (×4): 40 mg via SUBCUTANEOUS
  Filled 2019-06-17 (×5): qty 0.4

## 2019-06-17 MED ORDER — FENTANYL CITRATE (PF) 100 MCG/2ML IJ SOLN: 25.0000 ug | INTRAMUSCULAR | Status: DC | PRN

## 2019-06-17 MED ORDER — LACTATED RINGERS IV SOLN
INTRAVENOUS | Status: DC | PRN
  Administered 2019-06-17: 13:00:00 via INTRAVENOUS

## 2019-06-17 MED ORDER — ACETAMINOPHEN 325 MG PO TABS: 325.0000 mg | ORAL_TABLET | ORAL | Status: DC | PRN

## 2019-06-17 MED ORDER — ACETAMINOPHEN 160 MG/5ML PO SOLN: 325.0000 mg | ORAL | Status: DC | PRN

## 2019-06-17 MED ORDER — ROCURONIUM BROMIDE 10 MG/ML (PF) SYRINGE
PREFILLED_SYRINGE | INTRAVENOUS | Status: DC | PRN
  Administered 2019-06-17: 20 mg via INTRAVENOUS
  Administered 2019-06-17: 30 mg via INTRAVENOUS

## 2019-06-17 MED ORDER — TRANEXAMIC ACID 1000 MG/10ML IV SOLN
INTRAVENOUS | Status: DC | PRN
  Administered 2019-06-17: 1000 mg via INTRAVENOUS

## 2019-06-17 MED ORDER — ONDANSETRON HCL 4 MG PO TABS: 4.0000 mg | ORAL_TABLET | Freq: Four times a day (QID) | ORAL | Status: DC | PRN

## 2019-06-17 MED ORDER — ONDANSETRON HCL 4 MG/2ML IJ SOLN
INTRAMUSCULAR | Status: DC | PRN
  Administered 2019-06-17: 4 mg via INTRAVENOUS

## 2019-06-17 MED ORDER — ISOPROPYL ALCOHOL 70 % SOLN
Status: DC | PRN
  Administered 2019-06-17: 1 via TOPICAL

## 2019-06-17 MED ORDER — OXYCODONE HCL 5 MG/5ML PO SOLN: 5.0000 mg | Freq: Once | ORAL | Status: DC | PRN

## 2019-06-17 MED ORDER — ACETAMINOPHEN 500 MG PO TABS: 1000.0000 mg | ORAL_TABLET | Freq: Once | ORAL | Status: DC

## 2019-06-17 MED ORDER — LIDOCAINE 2% (20 MG/ML) 5 ML SYRINGE
INTRAMUSCULAR | Status: AC
  Filled 2019-06-17: qty 5

## 2019-06-17 MED ORDER — PROPOFOL 10 MG/ML IV BOLUS
INTRAVENOUS | Status: DC | PRN
  Administered 2019-06-17: 100 mg via INTRAVENOUS

## 2019-06-17 MED ORDER — METOCLOPRAMIDE HCL 5 MG/ML IJ SOLN
5.0000 mg | Freq: Three times a day (TID) | INTRAMUSCULAR | Status: DC | PRN
  Filled 2019-06-17: qty 2

## 2019-06-17 MED ORDER — SUCCINYLCHOLINE CHLORIDE 200 MG/10ML IV SOSY
PREFILLED_SYRINGE | INTRAVENOUS | Status: DC | PRN
  Administered 2019-06-17: 100 mg via INTRAVENOUS

## 2019-06-17 MED ORDER — ORAL CARE MOUTH RINSE
15.0000 mL | Freq: Two times a day (BID) | OROMUCOSAL | Status: DC
  Administered 2019-06-17 – 2019-06-24 (×14): 15 mL via OROMUCOSAL

## 2019-06-17 MED ORDER — SODIUM CHLORIDE 0.9 % IV SOLN
100.0000 mg | INTRAVENOUS | Status: AC
  Administered 2019-06-18 – 2019-06-21 (×4): 100 mg via INTRAVENOUS
  Filled 2019-06-17 (×2): qty 20
  Filled 2019-06-17 (×2): qty 100

## 2019-06-17 MED ORDER — PHENYLEPHRINE HCL-NACL 10-0.9 MG/250ML-% IV SOLN
INTRAVENOUS | Status: DC | PRN
  Administered 2019-06-17: 10 ug/min via INTRAVENOUS

## 2019-06-17 MED ORDER — 0.9 % SODIUM CHLORIDE (POUR BTL) OPTIME
TOPICAL | Status: DC | PRN
  Administered 2019-06-17: 1000 mL

## 2019-06-17 MED ORDER — TRANEXAMIC ACID-NACL 1000-0.7 MG/100ML-% IV SOLN
INTRAVENOUS | Status: AC
  Administered 2019-06-17: 15:00:00
  Filled 2019-06-17: qty 100

## 2019-06-17 MED ORDER — METOCLOPRAMIDE HCL 5 MG PO TABS
5.0000 mg | ORAL_TABLET | Freq: Three times a day (TID) | ORAL | Status: DC | PRN
  Filled 2019-06-17: qty 2

## 2019-06-17 MED ORDER — ONDANSETRON HCL 4 MG/2ML IJ SOLN
INTRAMUSCULAR | Status: AC
  Filled 2019-06-17: qty 2

## 2019-06-17 MED ORDER — CHLORHEXIDINE GLUCONATE CLOTH 2 % EX PADS
6.0000 | MEDICATED_PAD | Freq: Every day | CUTANEOUS | Status: DC
  Administered 2019-06-17 – 2019-06-24 (×6): 6 via TOPICAL

## 2019-06-17 MED ORDER — EPHEDRINE SULFATE-NACL 50-0.9 MG/10ML-% IV SOSY
PREFILLED_SYRINGE | INTRAVENOUS | Status: DC | PRN
  Administered 2019-06-17: 5 mg via INTRAVENOUS
  Administered 2019-06-17: 180 mg via INTRAVENOUS
  Administered 2019-06-17: 5 mg via INTRAVENOUS

## 2019-06-17 MED ORDER — ONDANSETRON HCL 4 MG/2ML IJ SOLN: 4.0000 mg | Freq: Once | INTRAMUSCULAR | Status: DC | PRN

## 2019-06-17 MED ORDER — OXYCODONE HCL 5 MG PO TABS: 5.0000 mg | ORAL_TABLET | Freq: Once | ORAL | Status: DC | PRN

## 2019-06-17 MED ORDER — PHENOL 1.4 % MT LIQD: 1.0000 | OROMUCOSAL | Status: DC | PRN

## 2019-06-17 MED ORDER — DOCUSATE SODIUM 100 MG PO CAPS: 100.0000 mg | ORAL_CAPSULE | Freq: Two times a day (BID) | ORAL | Status: DC

## 2019-06-17 MED ORDER — CEFAZOLIN SODIUM-DEXTROSE 2-4 GM/100ML-% IV SOLN
2.0000 g | Freq: Four times a day (QID) | INTRAVENOUS | Status: AC
  Administered 2019-06-17 (×2): 2 g via INTRAVENOUS
  Filled 2019-06-17 (×3): qty 100

## 2019-06-17 MED ORDER — FENTANYL CITRATE (PF) 250 MCG/5ML IJ SOLN
INTRAMUSCULAR | Status: DC | PRN
  Administered 2019-06-17 (×2): 50 ug via INTRAVENOUS

## 2019-06-17 MED ORDER — MENTHOL 3 MG MT LOZG
1.0000 | LOZENGE | OROMUCOSAL | Status: DC | PRN
  Filled 2019-06-17: qty 9

## 2019-06-17 MED ORDER — CEFAZOLIN SODIUM-DEXTROSE 2-4 GM/100ML-% IV SOLN
INTRAVENOUS | Status: AC
  Administered 2019-06-17: 15:00:00
  Filled 2019-06-17: qty 100

## 2019-06-17 MED ORDER — SODIUM CHLORIDE 0.9 % IV SOLN
200.0000 mg | Freq: Once | INTRAVENOUS | Status: AC
  Administered 2019-06-17: 200 mg via INTRAVENOUS
  Filled 2019-06-17: qty 40

## 2019-06-17 MED ORDER — PROPOFOL 10 MG/ML IV BOLUS
INTRAVENOUS | Status: AC
  Filled 2019-06-17: qty 20

## 2019-06-17 MED ORDER — ONDANSETRON HCL 4 MG/2ML IJ SOLN: 4.0000 mg | Freq: Four times a day (QID) | INTRAMUSCULAR | Status: DC | PRN

## 2019-06-17 MED ORDER — DEXAMETHASONE SODIUM PHOSPHATE 10 MG/ML IJ SOLN
INTRAMUSCULAR | Status: AC
  Filled 2019-06-17: qty 1

## 2019-06-17 MED ORDER — MEPERIDINE HCL 50 MG/ML IJ SOLN: 6.2500 mg | INTRAMUSCULAR | Status: DC | PRN

## 2019-06-17 MED ORDER — ISOPROPYL ALCOHOL 70 % SOLN
Status: AC
  Filled 2019-06-17: qty 480

## 2019-06-17 SURGICAL SUPPLY — 45 items
BAG ZIPLOCK 12X15 (MISCELLANEOUS) IMPLANT
BIT DRILL AO GAMMA 4.2X340 (BIT) ×1 IMPLANT
CHLORAPREP W/TINT 26 (MISCELLANEOUS) ×2 IMPLANT
COVER PERINEAL POST (MISCELLANEOUS) ×2 IMPLANT
COVER SURGICAL LIGHT HANDLE (MISCELLANEOUS) ×2 IMPLANT
COVER WAND RF STERILE (DRAPES) IMPLANT
DERMABOND ADVANCED (GAUZE/BANDAGES/DRESSINGS) ×1
DERMABOND ADVANCED .7 DNX12 (GAUZE/BANDAGES/DRESSINGS) ×2 IMPLANT
DRAPE C-ARM 42X120 X-RAY (DRAPES) ×2 IMPLANT
DRAPE C-ARMOR (DRAPES) ×2 IMPLANT
DRAPE IMP U-DRAPE 54X76 (DRAPES) ×4 IMPLANT
DRAPE SHEET LG 3/4 BI-LAMINATE (DRAPES) ×4 IMPLANT
DRAPE STERI IOBAN 125X83 (DRAPES) ×2 IMPLANT
DRAPE U-SHAPE 47X51 STRL (DRAPES) ×4 IMPLANT
DRSG MEPILEX BORDER 4X4 (GAUZE/BANDAGES/DRESSINGS) ×4 IMPLANT
DRSG MEPILEX BORDER 4X8 (GAUZE/BANDAGES/DRESSINGS) IMPLANT
ELECT BLADE TIP CTD 4 INCH (ELECTRODE) IMPLANT
FACESHIELD WRAPAROUND (MASK) ×4 IMPLANT
FACESHIELD WRAPAROUND OR TEAM (MASK) ×2 IMPLANT
GAUZE SPONGE 4X4 12PLY STRL (GAUZE/BANDAGES/DRESSINGS) ×2 IMPLANT
GLOVE BIO SURGEON STRL SZ8.5 (GLOVE) ×4 IMPLANT
GLOVE BIOGEL PI IND STRL 8.5 (GLOVE) ×1 IMPLANT
GLOVE BIOGEL PI INDICATOR 8.5 (GLOVE) ×1
GOWN SPEC L3 XXLG W/TWL (GOWN DISPOSABLE) ×2 IMPLANT
K-WIRE  3.2X450M STR (WIRE) ×1
K-WIRE 3.2X450M STR (WIRE) ×1
KIT BASIN OR (CUSTOM PROCEDURE TRAY) ×2 IMPLANT
KIT TURNOVER KIT A (KITS) IMPLANT
KWIRE 3.2X450M STR (WIRE) IMPLANT
MANIFOLD NEPTUNE II (INSTRUMENTS) ×2 IMPLANT
MARKER SKIN DUAL TIP RULER LAB (MISCELLANEOUS) ×2 IMPLANT
NAIL TROCH GAMMA 3 KT (Nail) ×1 IMPLANT
PACK GENERAL/GYN (CUSTOM PROCEDURE TRAY) ×2 IMPLANT
PENCIL SMOKE EVACUATOR (MISCELLANEOUS) IMPLANT
SCREW LAG GAMMA 3 TI 10.5X105M (Screw) ×1 IMPLANT
SCREW LOCKING T2 F/T  5X37.5MM (Screw) ×1 IMPLANT
SCREW LOCKING T2 F/T 5X37.5MM (Screw) IMPLANT
SCREW SET GAMMA 3 (Screw) ×1 IMPLANT
STAPLER VISISTAT 35W (STAPLE) ×1 IMPLANT
SUT MNCRL AB 3-0 PS2 18 (SUTURE) ×2 IMPLANT
SUT MON AB 2-0 CT1 36 (SUTURE) ×1 IMPLANT
SUT MON AB 2-0 SH 27 (SUTURE) ×1
SUT MON AB 2-0 SH27 (SUTURE) IMPLANT
SUT VIC AB 1 CT1 36 (SUTURE) ×2 IMPLANT
TOWEL OR 17X26 10 PK STRL BLUE (TOWEL DISPOSABLE) ×2 IMPLANT

## 2019-06-17 NOTE — Op Note (Signed)
OPERATIVE REPORT  SURGEON: Rod Can, MD   ASSISTANT: Staff.  PREOPERATIVE DIAGNOSIS: Right intertrochanteric femur fracture.   POSTOPERATIVE DIAGNOSIS: Right intertrochanteric femur fracture.   PROCEDURE: Intramedullary fixation, Right femur.   IMPLANTS: Stryker Gamma3 Hip Fracture Nail, 11 by 180 mm, 130 degrees. 10.5 x 105 mm Hip Fracture Nail Lag Screw. 5 x 37.5 mm distal interlocking screw 1.  ANESTHESIA:  General  ESTIMATED BLOOD LOSS:-50 mL    ANTIBIOTICS: 2 g Ancef.  DRAINS: None.  COMPLICATIONS: None.   CONDITION: PACU - hemodynamically stable.Marland Kitchen   BRIEF CLINICAL NOTE: Jesse [name] is a 85 y.o. male who presented with an intertrochanteric femur fracture. The patient was admitted to the hospitalist service and underwent perioperative risk stratification and medical optimization. The risks, benefits, and alternatives to the procedure were explained, and the patient elected to proceed.  PROCEDURE IN DETAIL: Surgical site was marked by myself. The patient was taken to the operating room and anesthesia was induced on the bed. The patient was then transferred to the [facility] table and the nonoperative lower extremity was scissored underneath the operative side. The fracture was reduced with traction, internal rotation, and adduction. The hip was prepped and draped in the normal sterile surgical fashion. Timeout was called verifying side and site of surgery. Preop antibiotics were given with 60 minutes of beginning the procedure.  Fluoroscopy was used to define the patient's anatomy. A 4 cm incision was made just proximal to the tip of the greater trochanter. The awl was used to obtain the standard starting point for a trochanteric entry nail under fluoroscopic control. The guidepin was placed. The entry reamer was used to open the proximal femur.  On the back table, the nail was assembled onto the jig. The nail was placed into the femur without any difficulty. Through a separate  stab incision, the cannula was placed down to the bone in preparation for the cephalomedullary device. A guidepin was placed into the femoral head using AP and lateral fluoroscopy views. The pin was measured, and then reaming was performed to the appropriate depth. The lag screw was inserted to the appropriate depth. The fracture was compressed through the jig. The setscrew was tightened and then loosened one quarter turn. A separate stab incision was created, and the distal interlocking screw was placed using standard AO technique. The jig was removed. Final AP and lateral fluoroscopy views were obtained to confirm fracture reduction and hardware placement. Tip apex distance was appropriate. There was no chondral penetration.  The wounds were copiously irrigated with saline. The wound was closed in layers with #1 Vicryl for the fascia, 2-0 Monocryl for the deep dermal layer, and staples. Glue was applied to the skin. Once the glue was fully hardened, sterile dressing was applied. The patient was then awakened from anesthesia and taken to the PACU in stable condition. Sponge needle and instrument counts were correct at the end of the case 2. There were no known complications.  We will readmit the patient to the hospitalist. Weightbearing status will be weightbearing as toelrated with a walker. We will begin Lovenox for DVT prophylaxis. The patient will work with physical therapy and undergo disposition planning.

## 2019-06-17 NOTE — Progress Notes (Signed)
PROGRESS NOTE    Jesse [name]  HQI:[id number] DOB: 1928/04/21 DOA: 06/15/2019 PCP: [name], MD   Brief Narrative:  HPI per Dr. Shela Leff on 06/15/2019 Jesse [name] is a 85 y.o. male with medical history significant of AAA, arthritis, history of prostate cancer, CAD status post CABG, chronic systolic congestive heart failure, GERD, hypertension, hyperlipidemia, RBBB presenting to the ED for evaluation of right hip pain after a fall.  Patient states he was walking in his driveway and stepped on a stone.  He then bent down to pick up the stone and as he was trying to throw it away he lost his balance and fell on his right hip.  Since then he is having pain in this area.  He did not hit his head or lose consciousness.  He was not having any lightheadedness/dizziness, chest pain, or shortness of breath prior to the fall.  States he has had problems with his balance for the past few years and 2 months ago fell and fractured his left hip.  ED Course: Afebrile and hemodynamically stable.  No leukocytosis.  Hemoglobin 11.6, above baseline.  SARS-CoV-2 test pending.  Chest x-ray showing stable mild cardiomegaly without overt pulmonary edema.  Stable mild left basilar scarring and no acute consolidative airspace disease.  X-ray showing acute intertrochanteric proximal right femur fracture without significant displacement.  No right hip dislocation.  X-ray of right knee pending.  Ortho planning on taking the patient to the OR in the morning.  Keep n.p.o. after midnight.  **Interim History  Currently the patient is asymptomatic from his Covid disease and states that he did have some sneezing on Tuesday and Wednesday but went to go see his PCP and was given medications for sinuses.  Surgery has been postponed and will be done today 06/17/2019.  Patient was started on IV steroids yesterday for his COVID-19 disease and was also started on remdesivir this morning given his slight increase in inflammatory  markers with a CRP.  Currently denies any current symptoms but still complains of some abdominal pain when he moves his leg.  Assessment & Plan:   Principal Problem:   Hip fracture ([place]) Active Problems:   HLD (hyperlipidemia)   Essential hypertension   CAD, ARTERY BYPASS GRAFT   Chronic systolic heart failure (HCC)  COVID-19 Infection -SARS-CoV-2 Testing was Positive and likely incidental Finding  -Currently does not have any Respiratory Symptoms and appears Asymptomatic -Initial CXR showed "Stable mild cardiomegaly without overt pulmonary edema. Stable mild left basilar scarring. No acute consolidative airspace disease." -Repeat CXR this AM showed "Stable cardiomegaly. No pneumothorax or pleural effusion is noted. Sternotomy wires are noted. No acute pulmonary disease is noted. Bony thorax is unremarkable." -C/w Airborne and Contact Precautions -Checked Baseline Inflammatory Markers and trend daily; baseline inflammatory markers obtained and showed an LDH of 141, CRP of 3.7, procalcitonin level of less than 0.10, ESR of 9, D-dimer of 11.74, and fibrinogen of 315 -Trending Inflammatory markers as below: Recent Labs    06/16/19 0900 06/17/19 0211  DDIMER 11.74* 5.62*  FERRITIN 261 216  LDH 141 134  CRP 3.7* 7.6*  -ESR went from 9 -> 6 -Fibrinogen went from 315 -> 344 Lab Results  Component Value Date   SARSCOV2NAA POSITIVE (A) 06/15/2019   SARSCOV2NAA NEGATIVE 04/19/2019   [facility] NEGATIVE 04/15/2019  SpO2: 95 % -Supportive Care and will add Zinc and Vitamin C Daily -C/w Antitussives with Tussionex and Robitussin DM -Started Albuterol Inhaler 1-2 puff q4hprn -Given  Decadron 10 mg x1 yesterday and 6 mg daily after -MRSA PCR was Negative  -Repeat CXR in AM   Right hip fracture secondary to a mechanical fall -X-ray showing acute intertrochanteric proximal right femur fracture without significant displacement.  No right hip dislocation.  Ortho planning on taking the  patient to the OR in the morning. -Kept n.p.o. after midnight but Surgery will be delayed until tomorrow 11/23 so will give a Cardiac Diet for now  -No anticoagulation for VTE prophylaxis at this time per Ortho request  -C/w Morphine 0.5 mg as needed q2h for Severe Pain, Norco as needed 1-2 tablet q6h -Robaxin as needed -Bowel Regimen -Preop Cefazolin ordered -Appreciate Ortho Assistance and Further Evaluation and Management  -Defer to Ortho to restart pharmacological prophylaxis  Chronic systolic congestive heart failure -Currently Euvolemic. -Continue Carvedilol 12.5 mg po BID and Losartan 50 mg po Daily  -Hold on IV fluid hydration at this time but will give IV sodium phosphate 20 mmol -Strict I's and O's and Daily Weights; I's and O's are inaccurate as patient is with +6.6 mL since admission -Weight is up 13 pounds but question if this is accurate or not -Continue to monitor for signs and symptoms of volume overload  CAD status post CABG -Stable. No anginal symptoms. -Continue Carvedilol 12.5 mg po BID, Losartan 50 mg po daily and Simvastatin 40 mg po qHS but his medications were held this morning due to his surgery  Hypertension -Stable. -Continue Carvedilol 12.5 mg po BID, Losartan 50 mg po daily -BP this morning was 160/78  Hyperlipidemia -Continue Simvastatin 40 mg po qHS and Fenofibrate 160 mg po Daily n.p.o. resumption takes place  GERD -Continue PPI with Pantoprazole 40 mg po Daily   Metabolic Acidosis -Mild. -CO2 was 20, Chloride was 110, and AG was 6 -Continue to Monitor and Trend and repeat this AM showed a CO2 of 21, chloride level of 105, anion gap of 8 -Given a dose of sodium phosphate 20 mmol yesterday and will hold further fluids at this time -Continue to monitor and repeat CMP in AM  Normocytic Anemia -Paitent's Hb/Hct was 11.6/36.8 on Admission -Repeat this a.m. showed a hemoglobin/hematocrit of 11.2/35.8 -Checked Anemia Panel and showed an iron  level of 41, U IBC of 252, TIBC of 293, saturation ratios of 14%, ferritin level 261, folate level 1.7, vitamin B12 level of 1172 -Continue to Monitor for S/Sx of Bleeding; Currently no overt bleeding noted -Repeat CBC in AM  Hyponatremia -Mild and sodium went from 130 -> 134 -Continue to monitor and trend -Repeat CMP in a.m.  Hypophosphatemia -Patient's phosphorus level was 2.6 this AM -Continue to monitor and replete as necessary -Repeat phosphorus level in a.m.  Elevated BUN -In the setting of steroid demargination patient was placed on Decadron -Patient's BUN went from 19 is now 24 next-continue to monitor and repeat CMP in a.m.  DVT prophylaxis: Pharmacological Prophylaxis held in anticipation of Surgery Code Status: DO NOT RESUSCITATE  Family Communication: No family present at bedside Disposition Plan: Pending further work-up and evaluation and surgical intervention by orthopedic surgery  Consultants:   Orthopedic surgery Dr. Lyla Glassing   Procedures: None   Antimicrobials:  Anti-infectives (From admission, onward)   Start     Dose/Rate Route Frequency Ordered Stop   06/18/19 1000  [MAR Hold]  remdesivir 100 mg in sodium chloride 0.9 % 250 mL IVPB     (MAR Hold since Mon 06/17/2019 at 1230.Hold Reason: Transfer to a Procedural area.)  100 mg 500 mL/hr over 30 Minutes Intravenous Every 24 hours 06/17/19 0750 06/22/19 0959   06/17/19 1142  ceFAZolin (ANCEF) 2-4 GM/100ML-% IVPB    Note to Pharmacy: [place]  : cabinet override      06/17/19 1142 06/17/19 2344   06/17/19 1000  remdesivir 200 mg in sodium chloride 0.9 % 250 mL IVPB     200 mg 500 mL/hr over 30 Minutes Intravenous Once 06/17/19 0750 06/17/19 0911   06/16/19 0600  ceFAZolin (ANCEF) IVPB 2g/100 mL premix     2 g 200 mL/hr over 30 Minutes Intravenous On call to O.R. 06/15/19 2249 06/17/19 0559   06/16/19 0600  ceFAZolin (ANCEF) IVPB 2g/100 mL premix  Status:  Discontinued     2 g 200 mL/hr over  30 Minutes Intravenous On call to O.R. 06/15/19 2252 06/15/19 2308     Subjective: Seen and examined at bedside.  Little bit anxious but had no chest pain, shortness breath, lightheadedness or dizziness.  States that he is not coughing up anything.  No other concerns or complaints at this time but was anxious about being diagnosed with Covid and became a bit tearful.  States that he has pain in his right hip only hurts when he moves his leg.  No other concerns or complaints at this time.  Objective: Vitals:   06/17/19 0848 06/17/19 0900 06/17/19 0932 06/17/19 1000  BP:   (!) 166/86 (!) 160/78  Pulse: (!) 56 (!) 59 (!) 56 (!) 54  Resp: '20 16 13 13  '$ Temp: (!) 97.5 F (36.4 C)     TempSrc:      SpO2: 97% 96% 94% 95%  Weight:      Height:        Intake/Output Summary (Last 24 hours) at 06/17/2019 1249 Last data filed at 06/17/2019 0800 Gross per 24 hour  Intake 256.64 ml  Output 300 ml  Net -43.36 ml   Filed Weights   06/15/19 1828 06/16/19 0752 06/17/19 0432  Weight: 79.4 kg 85.3 kg 85.6 kg   Examination: Physical Exam:  Constitutional: WN/WD overweight elderly Caucasian male in NAD but does appear somewhat anxious Eyes: Lids and conjunctivae normal, sclerae anicteric  ENMT: External Ears, Nose appear normal. Grossly normal hearing. Mucous membranes are moist. Neck: Appears normal, supple, no cervical masses, normal ROM, no appreciable thyromegaly; no JVD Respiratory: Diminished to auscultation bilaterally, no wheezing, rales, rhonchi or crackles. Normal respiratory effort and patient is not tachypenic. No accessory muscle use.  Unlabored breathing Cardiovascular: RRR, no murmurs / rubs / gallops. S1 and S2 auscultated.  Abdomen: Soft, non-tender, stented secondary to body habitus and has prior surgical scars along with palpable mesh hernia. No masses palpated. No appreciable hepatosplenomegaly. Bowel sounds positive .  GU: Deferred. Musculoskeletal: No clubbing cyanosis.   Right leg is shortened and externally rotated Skin: No rashes, lesions, ulcers on limited skin evaluation. No induration; Warm and dry.  Neurologic: CN 2-12 grossly intact with no focal deficits. Romberg sign and cerebellar reflexes not assessed.  Psychiatric: Normal judgment and insight. Alert and oriented x 3. Normal mood and appropriate affect.   Data Reviewed: I have personally reviewed following labs and imaging studies  CBC: Recent Labs  Lab 06/15/19 2046 06/16/19 0900 06/17/19 0211  WBC 6.5 4.6 4.4  NEUTROABS  --  3.5 3.9  HGB 11.6* 11.7* 11.2*  HCT 36.8* 37.1* 35.8*  MCV 93.6 93.2 93.0  PLT 215 217 174   Basic Metabolic Panel: Recent Labs  Lab 06/15/19 2046 06/16/19 0900 06/17/19 0211  NA 136 130* 134*  K 3.5 3.5 4.2  CL 110 100 105  CO2 20* 21* 21*  GLUCOSE 94 93 180*  BUN 20 19 24*  CREATININE 0.79 0.84 0.79  CALCIUM 8.0* 8.2* 9.1  MG  --  1.8 1.8  PHOS  --  2.1* 2.6   GFR: Estimated Creatinine Clearance: 68.7 mL/min (by C-G formula based on SCr of 0.79 mg/dL). Liver Function Tests: Recent Labs  Lab 06/15/19 2046 06/16/19 0900 06/17/19 0211  AST '20 23 20  '$ ALT '14 14 14  '$ ALKPHOS 54 56 50  BILITOT 0.7 1.2 0.6  PROT 5.6* 5.8* 5.6*  ALBUMIN 3.1* 3.4* 3.1*   No results for input(s): LIPASE, AMYLASE in the last 168 hours. No results for input(s): AMMONIA in the last 168 hours. Coagulation Profile: No results for input(s): INR, PROTIME in the last 168 hours. Cardiac Enzymes: No results for input(s): CKTOTAL, CKMB, CKMBINDEX, TROPONINI in the last 168 hours. BNP (last 3 results) No results for input(s): PROBNP in the last 8760 hours. HbA1C: No results for input(s): HGBA1C in the last 72 hours. CBG: No results for input(s): GLUCAP in the last 168 hours. Lipid Profile: No results for input(s): CHOL, HDL, LDLCALC, TRIG, CHOLHDL, LDLDIRECT in the last 72 hours. Thyroid Function Tests: No results for input(s): TSH, T4TOTAL, FREET4, T3FREE, THYROIDAB in  the last 72 hours. Anemia Panel: Recent Labs    06/16/19 0900 06/17/19 0211  VITAMINB12 1,172*  --   FOLATE 11.7  --   FERRITIN 261 216  TIBC 293  --   IRON 41*  --   RETICCTPCT 0.7  --    Sepsis Labs: Recent Labs  Lab 06/16/19 0900 06/17/19 0211  PROCALCITON <0.10 <0.10  LATICACIDVEN 0.9  --     Recent Results (from the past 240 hour(s))  SARS CORONAVIRUS 2 (TAT 6-24 HRS) Nasopharyngeal Nasopharyngeal Swab     Status: Abnormal   Collection Time: 06/15/19  8:46 PM   Specimen: Nasopharyngeal Swab  Result Value Ref Range Status   SARS Coronavirus 2 POSITIVE (A) NEGATIVE Final    Comment: RESULT CALLED TO, READ BACK BY AND VERIFIED WITH: Alanson Aly, RN AT [phone] ON 06/16/2019 BY SAINVILUS S (NOTE) SARS-CoV-2 target nucleic acids are DETECTED. The SARS-CoV-2 RNA is generally detectable in upper and lower respiratory specimens during the acute phase of infection. Positive results are indicative of active infection with SARS-CoV-2. Clinical  correlation with patient history and other diagnostic information is necessary to determine patient infection status. Positive results do  not rule out bacterial infection or co-infection with other viruses. The expected result is Negative. Fact Sheet for Patients: SugarRoll.be Fact Sheet for Healthcare Providers: https://www.woods-mathews.com/ This test is not yet approved or cleared by the [place] FDA and  has been authorized for detection and/or diagnosis of SARS-CoV-2 by FDA under an Emergency Use Authorization (EUA). This EUA will remain  in effect (meaning this test  can be used) for the duration of the COVID-19 declaration under Section 564(b)(1) of the Act, 21 U.S.C. section 360bbb-3(b)(1), unless the authorization is terminated or revoked sooner. Performed at [facility], [place] [address] [place], [place] [address]   Surgical pcr screen     Status: None   Collection  Time: 06/16/19  1:35 AM   Specimen: Nasal Mucosa; Nasal Swab  Result Value Ref Range Status   MRSA, PCR NEGATIVE NEGATIVE Final   Staphylococcus aureus NEGATIVE NEGATIVE Final  Comment: (NOTE) The Xpert SA Assay (FDA approved for NASAL specimens in patients 44 years of age and older), is one component of a comprehensive surveillance program. It is not intended to diagnose infection nor to guide or monitor treatment. Performed at [facility], [place] [address], [place], [place] [address]     Radiology Studies: Xr Chest Preop 1 View  Result Date: 06/15/2019 CLINICAL DATA:  Right hip fracture EXAM: CHEST  1 VIEW COMPARISON:  04/15/2019 chest radiograph. FINDINGS: Intact sternotomy wires. CABG clips overlie the mediastinum. Stable cardiomediastinal silhouette with mild cardiomegaly. No pneumothorax. No pleural effusion. No overt pulmonary edema. Mild left basilar scarring, stable. No acute consolidative airspace disease. IMPRESSION: 1. Stable mild cardiomegaly without overt pulmonary edema. 2. Stable mild left basilar scarring. No acute consolidative airspace disease. Electronically Signed   By: Ilona Sorrel M.D.   On: 06/15/2019 20:19   Dg Chest Port 1 View  Result Date: 06/17/2019 CLINICAL DATA:  Shortness of breath. EXAM: PORTABLE CHEST 1 VIEW COMPARISON:  June 15, 2019. FINDINGS: Stable cardiomegaly. No pneumothorax or pleural effusion is noted. Sternotomy wires are noted. No acute pulmonary disease is noted. Bony thorax is unremarkable. IMPRESSION: No active disease. Electronically Signed   By: Marijo Conception M.D.   On: 06/17/2019 07:32   Dg Knee Right Port  Result Date: 06/15/2019 CLINICAL DATA:  Initial evaluation for acute trauma, fall. Knee pain. EXAM: PORTABLE RIGHT KNEE - 1-2 VIEW COMPARISON:  Prior radiograph from 04/15/2019 FINDINGS: Two cannulated lack fixation screws with cerclage wires again seen traversing the patella. Appearance is stable without  hardware complication. No acute fracture or dislocation. No joint effusion. Mild osteoarthritic changes involving the patellofemoral articulation again noted. No visible acute soft tissue injury. Extensive atherosclerotic change noted about the knee with diffusely ectatic versus aneurysm involving the distal femoral and/or popliteal arteries. Osteopenia. IMPRESSION: 1. No acute osseous abnormality. 2. Sequelae of prior ORIF at the patella without hardware complication. 3. Aortic Atherosclerosis (ICD10-I70.0). Diffusely ectatic versus aneurysmal dilatation of the distal femoral and/or popliteal arteries. Finding could be further assessed with dedicated cross-sectional imaging as clinically warranted. Electronically Signed   By: Jeannine Boga M.D.   On: 06/15/2019 22:58   Xr Hip Right  Result Date: 06/15/2019 CLINICAL DATA:  Fall with right hip and pelvic pain EXAM: DG HIP (WITH OR WITHOUT PELVIS) 2-3V RIGHT COMPARISON:  04/15/2019 pelvic and left hip radiographs FINDINGS: Acute intertrochanteric proximal right femur fracture without significant displacement. Fixation hardware partially visualized in the proximal left femur. No pelvic diastasis. Surgical clips overlie bilateral inguinal regions and sacrum. No suspicious focal osseous lesions. Degenerative changes in the visualized lower lumbar spine. Mild bilateral hip osteoarthritis. No hip dislocation. IMPRESSION: 1. Acute intertrochanteric proximal right femur fracture without significant displacement. No right hip dislocation. 2. Mild bilateral hip osteoarthritis. Electronically Signed   By: Ilona Sorrel M.D.   On: 06/15/2019 19:56   Scheduled Meds: . [MAR Hold] carvedilol  12.5 mg Oral BID WC  . [MAR Hold] Chlorhexidine Gluconate Cloth  6 each Topical Daily  . [MAR Hold] dexamethasone (DECADRON) injection  6 mg Intravenous Q24H  . [MAR Hold] docusate sodium  100 mg Oral BID  . [MAR Hold] fenofibrate  160 mg Oral Daily  . [MAR Hold] ferrous  sulfate  325 mg Oral Q breakfast  . [MAR Hold] loratadine  10 mg Oral Daily  . [MAR Hold] losartan  50 mg Oral Daily  . [MAR Hold] pantoprazole  40 mg Oral  Daily  . [MAR Hold] polyvinyl alcohol  1-2 drop Both Eyes QHS  . [MAR Hold] povidone-iodine  2 application Topical Once  . [MAR Hold] simvastatin  40 mg Oral q1800  . [MAR Hold] sodium chloride  2 spray Each Nare BID  . [MAR Hold] vitamin C  500 mg Oral Daily  . [MAR Hold] zinc sulfate  220 mg Oral Daily   Continuous Infusions: . ceFAZolin    . [MAR Hold] methocarbamol (ROBAXIN) IV Stopped (06/16/19 0215)  . [MAR Hold] remdesivir 100 mg in NS 250 mL    . tranexamic acid      LOS: 2 days   Kerney Elbe, DO [facility] PAGER is on [place]  If 7PM-7AM, please contact night-coverage [email or web address]

## 2019-06-17 NOTE — Discharge Instructions (Signed)
 Dr. Izaia Say Adult Hip & Knee Specialist [facility] [address] [place], [place] [address] [phone]   POSTOPERATIVE DIRECTIONS    Hip Rehabilitation, Guidelines Following Surgery   WEIGHT BEARING Weight bearing as tolerated with assist device (walker, cane, etc) as directed, use it as long as suggested by your surgeon or therapist, typically at least 4-6 weeks.   HOME CARE INSTRUCTIONS  Remove items at home which could result in a fall. This includes throw rugs or furniture in walking pathways.  Continue medications as instructed at time of discharge.  You may have some home medications which will be placed on hold until you complete the course of blood thinner medication.  4 days after discharge, you may start showering. No tub baths or soaking your incisions. Do not put on socks or shoes without following the instructions of your caregivers.   Sit on chairs with arms. Use the chair arms to help push yourself up when arising.  Arrange for the use of a toilet seat elevator so you are not sitting low.   Walk with walker as instructed.  You may resume a sexual relationship in one month or when given the OK by your caregiver.  Use walker as long as suggested by your caregivers.  Avoid periods of inactivity such as sitting longer than an hour when not asleep. This helps prevent blood clots.  You may return to work once you are cleared by your surgeon.  Do not drive a car for 6 weeks or until released by your surgeon.  Do not drive while taking narcotics.  Wear elastic stockings for two weeks following surgery during the day but you may remove then at night.  Make sure you keep all of your appointments after your operation with all of your doctors and caregivers. You should call the office at the above phone number and make an appointment for approximately two weeks after the date of your surgery. Please pick up a stool softener and laxative  for home use as long as you are requiring pain medications.  ICE to the affected hip every three hours for 30 minutes at a time and then as needed for pain and swelling. Continue to use ice on the hip for pain and swelling from surgery. You may notice swelling that will progress down to the foot and ankle.  This is normal after surgery.  Elevate the leg when you are not up walking on it.   It is important for you to complete the blood thinner medication as prescribed by your doctor.  Continue to use the breathing machine which will help keep your temperature down.  It is common for your temperature to cycle up and down following surgery, especially at night when you are not up moving around and exerting yourself.  The breathing machine keeps your lungs expanded and your temperature down.  RANGE OF MOTION AND STRENGTHENING EXERCISES  These exercises are designed to help you keep full movement of your hip joint. Follow your caregiver's or physical therapist's instructions. Perform all exercises about fifteen times, three times per day or as directed. Exercise both hips, even if you have had only one joint replacement. These exercises can be done on a training (exercise) mat, on the floor, on a table or on a bed. Use whatever works the best and is most comfortable for you. Use music or television while you are exercising so that the exercises are a pleasant break in your day. This   will make your life better with the exercises acting as a break in routine you can look forward to.  Lying on your back, slowly slide your foot toward your buttocks, raising your knee up off the floor. Then slowly slide your foot back down until your leg is straight again.  Lying on your back spread your legs as far apart as you can without causing discomfort.  Lying on your side, raise your upper leg and foot straight up from the floor as far as is comfortable. Slowly lower the leg and repeat.  Lying on your back, tighten up the  muscle in the front of your thigh (quadriceps muscles). You can do this by keeping your leg straight and trying to raise your heel off the floor. This helps strengthen the largest muscle supporting your knee.  Lying on your back, tighten up the muscles of your buttocks both with the legs straight and with the knee bent at a comfortable angle while keeping your heel on the floor.   SKILLED REHAB INSTRUCTIONS: If the patient is transferred to a skilled rehab facility following release from the hospital, a list of the current medications will be sent to the facility for the patient to continue.  When discharged from the skilled rehab facility, please have the facility set up the patient's Home Health Physical Therapy prior to being released. Also, the skilled facility will be responsible for providing the patient with their medications at time of release from the facility to include their pain medication and their blood thinner medication. If the patient is still at the rehab facility at time of the two week follow up appointment, the skilled rehab facility will also need to assist the patient in arranging follow up appointment in our office and any transportation needs.  MAKE SURE YOU:  Understand these instructions.  Will watch your condition.  Will get help right away if you are not doing well or get worse.  Pick up stool softner and laxative for home use following surgery while on pain medications. Daily dry dressing changes as needed. In 4 days, you may remove your dressings and begin taking showers - no tub baths or soaking the incisions. Continue to use ice for pain and swelling after surgery. Do not use any lotions or creams on the incision until instructed by your surgeon.   

## 2019-06-17 NOTE — Consult Note (Signed)
ORTHOPAEDIC CONSULTATION  REQUESTING PHYSICIAN: Kerney Elbe, DO  PCP:  [name], MD  Chief Complaint: R hip injury  HPI: Jesse [name] is a 85 y.o. male who complains of right hip injury after he tripped and fell on the driveway while getting the mail.  He was brought to the emergency department at [facility], where x-rays revealed a displaced right intertrochanteric femur fracture.  He denies other injuries.  He is known to me for previous IM nail of left intertrochanteric femur fracture on 04/16/2019.  He was admitted by the hospitalist service for perioperative risk stratification and medical optimization.  His screening COVID-19 test was positive, although he is asymptomatic.  Past Medical History:  Diagnosis Date  . AAA (abdominal aortic aneurysm) ([place])   . Arthritis   . Bronchitis    hx of appro 40 years ago  . Cancer ([place]) 2010   prostate ( 40 Txs. of radiation treatments)  . Coronary artery disease   . Environmental allergies   . GERD (gastroesophageal reflux disease)   . HOH (hard of hearing)    wears bilateral hearing aids  . Hyperlipidemia   . Hypertension   . Kidney stone 1968  . Myocardial infarction ([place]) 1992  . Pneumonia    hx of  . RBBB    Past Surgical History:  Procedure Laterality Date  . ABDOMINAL AORTIC ANEURYSM REPAIR  08-13-1993   Dr Cameron Sprang  . CARDIAC CATHETERIZATION    . COLONOSCOPY W/ POLYPECTOMY    . CORONARY ARTERY BYPASS GRAFT  03-01-1991   Dr. Servando Snare  . ESOPHAGOGASTRODUODENOSCOPY (EGD) WITH PROPOFOL N/A 05/04/2018   Procedure: ESOPHAGOGASTRODUODENOSCOPY (EGD) WITH PROPOFOL;  Surgeon: [name], MD;  Location: [facility];  Service: Gastroenterology;  Laterality: N/A;  . EYE SURGERY     Detached retina (Left eye); bilateral cataract removal  . FALSE ANEURYSM REPAIR  05/07/2012   Procedure: REPAIR FALSE ANEURYSM;  Surgeon: Rosetta Posner, MD;  Location: [facility] OR;  Service: Vascular;  Laterality: Left;  Repair of  Left Femoral Artery Aneurysm  . HERNIA REPAIR    . HOT HEMOSTASIS N/A 05/04/2018   Procedure: HOT HEMOSTASIS (ARGON PLASMA COAGULATION/BICAP);  Surgeon: [name], MD;  Location: [facility];  Service: Gastroenterology;  Laterality: N/A;  . INTRAMEDULLARY (IM) NAIL INTERTROCHANTERIC Left 04/16/2019   Procedure: INTRAMEDULLARY (IM) NAIL INTERTROCHANTRIC;  Surgeon: Rod Can, MD;  Location: [facility];  Service: Orthopedics;  Laterality: Left;  . ORIF PATELLA Right 06/09/2015   Procedure: OPEN REDUCTION INTERNAL (ORIF) FIXATION PATELLA;  Surgeon: Marybelle Killings, MD;  Location: [facility];  Service: Orthopedics;  Laterality: Right;  . repair of bowel obstruction  1999   Dr. Dalbert Batman  . repair of ventral hernia  04-20-1994   P. Cameron Sprang MD  . resection femoral artery  03/19/2012   Dr. Curt Jews  . SUBMUCOSAL INJECTION  05/04/2018   Procedure: SUBMUCOSAL INJECTION;  Surgeon: [name], MD;  Location: [facility];  Service: Gastroenterology;;   Social History   Socioeconomic History  . Marital status: Widowed    Spouse name: Not on file  . Number of children: Not on file  . Years of education: Not on file  . Highest education level: Not on file  Occupational History  . Not on file  Social Needs  . Financial resource strain: Not on file  . Food insecurity    Worry: Not on file    Inability: Not on file  . Transportation needs  Medical: Not on file    Non-medical: Not on file  Tobacco Use  . Smoking status: Former Smoker    Years: 20.00    Types: Cigarettes    Quit date: 07/25/1968    Years since quitting: 50.9  . Smokeless tobacco: Never Used  Substance and Sexual Activity  . Alcohol use: No  . Drug use: No  . Sexual activity: Not on file  Lifestyle  . Physical activity    Days per week: Not on file    Minutes per session: Not on file  . Stress: Not on file  Relationships  . Social Herbalist on phone: Not on file    Gets together: Not on file    Attends  religious service: Not on file    Active member of club or organization: Not on file    Attends meetings of clubs or organizations: Not on file    Relationship status: Not on file  Other Topics Concern  . Not on file  Social History Narrative  . Not on file   Family History  Problem Relation Age of Onset  . Cancer Mother   . Cancer Father   . Varicose Veins Father   . Cancer Sister   . Diabetes Sister   . Heart attack Sister    Allergies  Allergen Reactions  . Quinolones Other (See Comments)    unknown  . Adhesive [Tape] Itching and Rash  . Lactose Intolerance (Gi) Other (See Comments)    Gas, bloating, stomach indigestion    Prior to Admission medications   Medication Sig Start Date End Date Taking? Authorizing Provider  acetaminophen (TYLENOL) 500 MG tablet Take 500 mg by mouth every 6 (six) hours as needed for mild pain or headache.   Yes [provider]  amoxicillin-clavulanate (AUGMENTIN) 875-125 MG tablet Take 1 tablet by mouth 2 (two) times daily.  06/13/19  Yes [provider]  aspirin 81 MG tablet Take 81 mg by mouth daily.   Yes [provider]  carboxymethylcellulose (REFRESH PLUS) 0.5 % SOLN Apply 1-2 drops to eye at bedtime.    Yes [provider]  carvedilol (COREG) 12.5 MG tablet TAKE 1 TABLET BY MOUTH 2 TIMES DAILY WITH A MEAL Patient taking differently: Take 12.5 mg by mouth 2 (two) times daily with a meal.  05/29/19  Yes [name], MD  Cyanocobalamin (B-12 PO) Take 1 tablet by mouth daily.   Yes [provider]  fenofibrate 160 MG tablet Take 1 tablet (160 mg total) by mouth daily. 05/29/19  Yes [name], MD  ferrous sulfate 325 (65 FE) MG tablet Take 325 mg by mouth daily with breakfast.   Yes [provider]  hydrocortisone 2.5 % cream Apply 1 application topically as needed (for dry ears).  08/16/18  Yes [provider]  loratadine (CLARITIN) 10 MG tablet Take 10 mg by mouth daily.     Yes [provider]  losartan (COZAAR) 50 MG tablet Take 50 mg by mouth daily.   Yes [provider]  pantoprazole (PROTONIX) 40 MG tablet Take 40 mg by mouth daily.   Yes [provider]  simvastatin (ZOCOR) 40 MG tablet Take 1 tablet (40 mg total) by mouth daily at 6 PM. 05/29/19  Yes [name], MD  sodium chloride (OCEAN) 0.65 % SOLN nasal spray Place 2 sprays into both nostrils 2 (two) times daily.   Yes [provider]  docusate sodium (COLACE) 100 MG capsule  Take 2 capsules (200 mg total) by mouth 2 (two) times daily. Patient not taking: Reported on 06/15/2019 04/20/19   Georgette Shell, MD   Xr Chest Preop 1 View  Result Date: 06/15/2019 CLINICAL DATA:  Right hip fracture EXAM: CHEST  1 VIEW COMPARISON:  04/15/2019 chest radiograph. FINDINGS: Intact sternotomy wires. CABG clips overlie the mediastinum. Stable cardiomediastinal silhouette with mild cardiomegaly. No pneumothorax. No pleural effusion. No overt pulmonary edema. Mild left basilar scarring, stable. No acute consolidative airspace disease. IMPRESSION: 1. Stable mild cardiomegaly without overt pulmonary edema. 2. Stable mild left basilar scarring. No acute consolidative airspace disease. Electronically Signed   By: Ilona Sorrel M.D.   On: 06/15/2019 20:19   Dg Chest Port 1 View  Result Date: 06/17/2019 CLINICAL DATA:  Shortness of breath. EXAM: PORTABLE CHEST 1 VIEW COMPARISON:  June 15, 2019. FINDINGS: Stable cardiomegaly. No pneumothorax or pleural effusion is noted. Sternotomy wires are noted. No acute pulmonary disease is noted. Bony thorax is unremarkable. IMPRESSION: No active disease. Electronically Signed   By: Marijo Conception M.D.   On: 06/17/2019 07:32   Dg Knee Right Port  Result Date: 06/15/2019 CLINICAL DATA:  Initial evaluation for acute trauma, fall. Knee pain. EXAM: PORTABLE RIGHT KNEE - 1-2 VIEW COMPARISON:  Prior radiograph from 04/15/2019 FINDINGS: Two cannulated  lack fixation screws with cerclage wires again seen traversing the patella. Appearance is stable without hardware complication. No acute fracture or dislocation. No joint effusion. Mild osteoarthritic changes involving the patellofemoral articulation again noted. No visible acute soft tissue injury. Extensive atherosclerotic change noted about the knee with diffusely ectatic versus aneurysm involving the distal femoral and/or popliteal arteries. Osteopenia. IMPRESSION: 1. No acute osseous abnormality. 2. Sequelae of prior ORIF at the patella without hardware complication. 3. Aortic Atherosclerosis (ICD10-I70.0). Diffusely ectatic versus aneurysmal dilatation of the distal femoral and/or popliteal arteries. Finding could be further assessed with dedicated cross-sectional imaging as clinically warranted. Electronically Signed   By: Jeannine Boga M.D.   On: 06/15/2019 22:58   Xr Hip Right  Result Date: 06/15/2019 CLINICAL DATA:  Fall with right hip and pelvic pain EXAM: DG HIP (WITH OR WITHOUT PELVIS) 2-3V RIGHT COMPARISON:  04/15/2019 pelvic and left hip radiographs FINDINGS: Acute intertrochanteric proximal right femur fracture without significant displacement. Fixation hardware partially visualized in the proximal left femur. No pelvic diastasis. Surgical clips overlie bilateral inguinal regions and sacrum. No suspicious focal osseous lesions. Degenerative changes in the visualized lower lumbar spine. Mild bilateral hip osteoarthritis. No hip dislocation. IMPRESSION: 1. Acute intertrochanteric proximal right femur fracture without significant displacement. No right hip dislocation. 2. Mild bilateral hip osteoarthritis. Electronically Signed   By: Ilona Sorrel M.D.   On: 06/15/2019 19:56    Positive ROS: All other systems have been reviewed and were otherwise negative with the exception of those mentioned in the HPI and as above.  Physical Exam: General: Alert, no acute distress Cardiovascular: No  pedal edema Respiratory: No cyanosis, no use of accessory musculature GI: No organomegaly, abdomen is soft and non-tender Skin: No lesions in the area of chief complaint Neurologic: Sensation intact distally Psychiatric: Patient is competent for consent with normal mood and affect Lymphatic: No axillary or cervical lymphadenopathy  MUSCULOSKELETAL: Examination of the right hip reveals no skin wounds or lesions.  He is shortened and externally rotated.  He has pain with logrolling of the hip.  He is neurovascularly intact.  Assessment: Displaced right intertrochanteric femur fracture. COVID-19 positive.  Plan: I discussed  the findings with the patient.  He has a displaced right intertrochanteric femur fracture that requires surgical stabilization.  We discussed the risk, benefits, and alternatives to intramedullary nailing of the right hip.  Please see statement of risk.  Plan for surgery today.  All questions were solicited and answered.    Bertram Savin, MD [phone]    06/17/2019 12:05 PM

## 2019-06-17 NOTE — Progress Notes (Signed)
Per MD, hold all PO meds since patient is scheduled for surgery at 1145 am. Will give all IV meds and nasal spray.

## 2019-06-17 NOTE — Transfer of Care (Signed)
Immediate Anesthesia Transfer of Care Note  Patient: Jesse [name]  Procedure(s) Performed: RIGHT INTRAMEDULLARY (IM) NAIL INTERTROCHANTRIC (Right Hip)  Patient Location: ICU  Anesthesia Type:General  Level of Consciousness: awake, alert  and oriented  Airway & Oxygen Therapy: Patient Spontanous Breathing and Patient connected to face mask oxygen  Post-op Assessment: Report given to RN and Post -op Vital signs reviewed and stable  Post vital signs: Reviewed and stable  Last Vitals:  Vitals Value Taken Time  BP    Temp    Pulse    Resp    SpO2      Last Pain:  Vitals:   06/17/19 0738  TempSrc:   PainSc: 0-No pain      Patients Stated Pain Goal: 2 ([id number])  Complications: No apparent anesthesia complications

## 2019-06-17 NOTE — Anesthesia Procedure Notes (Signed)
Procedure Name: Intubation Date/Time: 06/17/2019 12:42 PM Performed by: Eben Burow, CRNA Pre-anesthesia Checklist: Patient identified, Emergency Drugs available, Suction available, Patient being monitored and Timeout performed Patient Re-evaluated:Patient Re-evaluated prior to induction Oxygen Delivery Method: Circle system utilized Preoxygenation: Pre-oxygenation with 100% oxygen Induction Type: IV induction and Rapid sequence Laryngoscope Size: Mac and 4 Grade View: Grade I Tube type: Oral Tube size: 7.5 mm Number of attempts: 1 Airway Equipment and Method: Stylet Placement Confirmation: ETT inserted through vocal cords under direct vision,  positive ETCO2 and breath sounds checked- equal and bilateral Secured at: 22 cm Tube secured with: Tape Dental Injury: Teeth and Oropharynx as per pre-operative assessment

## 2019-06-17 NOTE — Anesthesia Preprocedure Evaluation (Addendum)
Anesthesia Evaluation  Patient identified by MRN, date of birth, ID band Patient awake    Reviewed: Allergy & Precautions, NPO status , Patient's Chart, lab work & pertinent test results  Airway Mallampati: II  TM Distance: >3 FB Neck ROM: Full    Dental no notable dental hx.    Pulmonary neg pulmonary ROS, pneumonia, former smoker,    Pulmonary exam normal breath sounds clear to auscultation       Cardiovascular hypertension, + CAD, + Past MI and + Peripheral Vascular Disease  (-) Cardiac Stents negative cardio ROS Normal cardiovascular exam+ dysrhythmias  Rhythm:Regular Rate:Normal     Neuro/Psych negative neurological ROS  negative psych ROS   GI/Hepatic negative GI ROS, Neg liver ROS, PUD, GERD  ,  Endo/Other  negative endocrine ROS  Renal/GU Renal diseasenegative Renal ROS  negative genitourinary   Musculoskeletal negative musculoskeletal ROS (+)   Abdominal   Peds negative pediatric ROS (+)  Hematology negative hematology ROS (+) anemia ,   Anesthesia Other Findings   Reproductive/Obstetrics negative OB ROS                            Anesthesia Physical Anesthesia Plan  ASA: IV  Anesthesia Plan:    Post-op Pain Management:    Induction:   PONV Risk Score and Plan: 1 and Ondansetron  Airway Management Planned:   Additional Equipment:   Intra-op Plan:   Post-operative Plan:   Informed Consent:   Plan Discussed with: Anesthesiologist  Anesthesia Plan Comments:         Anesthesia Quick Evaluation

## 2019-06-18 ENCOUNTER — Encounter (HOSPITAL_COMMUNITY): Payer: Self-pay | Admitting: Orthopedic Surgery

## 2019-06-18 DIAGNOSIS — D62 Acute posthemorrhagic anemia: Secondary | ICD-10-CM

## 2019-06-18 LAB — COMPREHENSIVE METABOLIC PANEL
ALT: 14 U/L (ref 0–44)
AST: 19 U/L (ref 15–41)
Albumin: 2.6 g/dL — ABNORMAL LOW (ref 3.5–5.0)
Alkaline Phosphatase: 45 U/L (ref 38–126)
Anion gap: 7 (ref 5–15)
BUN: 30 mg/dL — ABNORMAL HIGH (ref 8–23)
CO2: 23 mmol/L (ref 22–32)
Calcium: 8.9 mg/dL (ref 8.9–10.3)
Chloride: 102 mmol/L (ref 98–111)
Creatinine, Ser: 0.81 mg/dL (ref 0.61–1.24)
GFR calc Af Amer: >60 mL/min (ref >60)
GFR calc non Af Amer: >60 mL/min (ref >60)
Glucose, Bld: 136 mg/dL — ABNORMAL HIGH (ref 70–99)
Potassium: 4 mmol/L (ref 3.5–5.1)
Sodium: 132 mmol/L — ABNORMAL LOW (ref 135–145)
Total Bilirubin: 0.6 mg/dL (ref 0.3–1.2)
Total Protein: 5.1 g/dL — ABNORMAL LOW (ref 6.5–8.1)

## 2019-06-18 LAB — CBC WITH DIFFERENTIAL/PLATELET
Abs Immature Granulocytes: 0.05 10*3/uL (ref 0.00–0.07)
Basophils Absolute: 0 10*3/uL (ref 0.0–0.1)
Basophils Relative: 0 %
Eosinophils Absolute: 0 10*3/uL (ref 0.0–0.5)
Eosinophils Relative: 0 %
HCT: 31.5 % — ABNORMAL LOW (ref 39.0–52.0)
Hemoglobin: 9.9 g/dL — ABNORMAL LOW (ref 13.0–17.0)
Immature Granulocytes: 1 %
Lymphocytes Relative: 4 %
Lymphs Abs: 0.5 10*3/uL — ABNORMAL LOW (ref 0.7–4.0)
MCH: 28.9 pg (ref 26.0–34.0)
MCHC: 31.4 g/dL (ref 30.0–36.0)
MCV: 91.8 fL (ref 80.0–100.0)
Monocytes Absolute: 0.6 10*3/uL (ref 0.1–1.0)
Monocytes Relative: 5 %
Neutro Abs: 9.5 10*3/uL — ABNORMAL HIGH (ref 1.7–7.7)
Neutrophils Relative %: 90 %
Platelets: 210 10*3/uL (ref 150–400)
RBC: 3.43 MIL/uL — ABNORMAL LOW (ref 4.22–5.81)
RDW: 13.7 % (ref 11.5–15.5)
WBC: 10.5 10*3/uL (ref 4.0–10.5)
nRBC: 0 % (ref 0.0–0.2)

## 2019-06-18 LAB — GLUCOSE, CAPILLARY
Glucose-Capillary: 105 mg/dL — ABNORMAL HIGH (ref 70–99)
Glucose-Capillary: 111 mg/dL — ABNORMAL HIGH (ref 70–99)
Glucose-Capillary: 129 mg/dL — ABNORMAL HIGH (ref 70–99)
Glucose-Capillary: 139 mg/dL — ABNORMAL HIGH (ref 70–99)
Glucose-Capillary: 66 mg/dL — ABNORMAL LOW (ref 70–99)

## 2019-06-18 LAB — LACTATE DEHYDROGENASE: LDH: 132 U/L (ref 98–192)

## 2019-06-18 LAB — C-REACTIVE PROTEIN: CRP: 3.9 mg/dL — ABNORMAL HIGH (ref <1.0)

## 2019-06-18 LAB — D-DIMER, QUANTITATIVE: D-Dimer, Quant: 3.87 ug/mL-FEU — ABNORMAL HIGH (ref 0.00–0.50)

## 2019-06-18 LAB — SEDIMENTATION RATE: Sed Rate: 5 mm/hr (ref 0–16)

## 2019-06-18 LAB — HEMOGLOBIN A1C
Hgb A1c MFr Bld: 5.2 % (ref 4.8–5.6)
Mean Plasma Glucose: 102.54 mg/dL

## 2019-06-18 LAB — PHOSPHORUS: Phosphorus: 2.6 mg/dL (ref 2.5–4.6)

## 2019-06-18 LAB — FERRITIN: Ferritin: 208 ng/mL (ref 24–336)

## 2019-06-18 LAB — FIBRINOGEN: Fibrinogen: 257 mg/dL (ref 210–475)

## 2019-06-18 MED ORDER — SODIUM CHLORIDE 0.9% FLUSH: 10.0000 mL | INTRAVENOUS | Status: DC | PRN

## 2019-06-18 MED ORDER — HYDROCODONE-ACETAMINOPHEN 5-325 MG PO TABS: 1.0000 | ORAL_TABLET | ORAL | 0 refills | Status: DC | PRN

## 2019-06-18 MED ORDER — ENOXAPARIN SODIUM 40 MG/0.4ML ~~LOC~~ SOLN: 40.0000 mg | SUBCUTANEOUS | 0 refills | Status: DC

## 2019-06-18 MED ORDER — SODIUM CHLORIDE 0.9% FLUSH
10.0000 mL | Freq: Two times a day (BID) | INTRAVENOUS | Status: DC
  Administered 2019-06-18 – 2019-06-24 (×13): 10 mL

## 2019-06-18 MED ORDER — INSULIN ASPART 100 UNIT/ML ~~LOC~~ SOLN
0.0000 [IU] | SUBCUTANEOUS | Status: DC
  Administered 2019-06-18 – 2019-06-21 (×5): 1 [IU] via SUBCUTANEOUS
  Administered 2019-06-22 – 2019-06-23 (×3): 2 [IU] via SUBCUTANEOUS
  Administered 2019-06-23 (×2): 1 [IU] via SUBCUTANEOUS

## 2019-06-18 NOTE — Anesthesia Postprocedure Evaluation (Signed)
Anesthesia Post Note  Patient: Jesse [name]  Procedure(s) Performed: RIGHT INTRAMEDULLARY (IM) NAIL INTERTROCHANTRIC (Right Hip)     Patient location during evaluation: PACU Anesthesia Type: General Level of consciousness: awake and alert Pain management: pain level controlled Vital Signs Assessment: post-procedure vital signs reviewed and stable Respiratory status: spontaneous breathing, nonlabored ventilation, respiratory function stable and patient connected to nasal cannula oxygen Cardiovascular status: blood pressure returned to baseline and stable Postop Assessment: no apparent nausea or vomiting Anesthetic complications: no    Last Vitals:  Vitals:   06/18/19 0000 06/18/19 0400  BP: 135/87 131/66  Pulse: 63 (!) 58  Resp: (!) 9 12  Temp: (!) 36.3 C 36.5 C  SpO2: 95% 93%    Last Pain:  Vitals:   06/18/19 1600  TempSrc:   PainSc: 1                  Fatim Vanderschaaf

## 2019-06-18 NOTE — Evaluation (Signed)
Physical Therapy Evaluation Patient Details Name: Jesse [name] MRN: [id number] DOB: 09-Jun-1928 Today's Date: 06/18/2019   History of Present Illness  Pt is 85 yo male admitted s/p fall iwth R intertrochanteric femur fraction and s/p intramedullary fixation on 06/17/19 with WBAT status.  Additionally, pt found to be COVID 19 positive.  Pt with  medical history significant of AAA, arthritis, history of prostate cancer, CAD status post CABG, chronic systolic congestive heart failure, GERD, hypertension, hyperlipidemia, RBBB.  Additionally pt with L IM nail 04/16/19 and ORIF patella 05/2015.  Clinical Impression   Pt admitted with above diagnosis and is POD #1 intramedullary fixation of R femur fx.  Pt was able to get OOB and take a few steps with min-mod A of 2 for transfers and ambulation.  He was limited due to some dizziness-VSS.  Pt demonstrating good strength in operative leg for POD #1.  Required encouragement that he did well for POD #1.   Pt currently with functional limitations due to the deficits listed below (see PT Problem List). Pt will benefit from skilled PT to increase their independence and safety with mobility to allow discharge to the venue listed below.       Follow Up Recommendations SNF    Equipment Recommendations  None recommended by PT    Recommendations for Other Services       Precautions / Restrictions Precautions Precautions: Fall Restrictions Weight Bearing Restrictions: Yes RLE Weight Bearing: Weight bearing as tolerated      Mobility  Bed Mobility Overal bed mobility: Needs Assistance Bed Mobility: Supine to Sit;Sit to Supine     Supine to sit: Min assist;+2 for physical assistance;+2 for safety/equipment Sit to supine: Mod assist;+2 for physical assistance   General bed mobility comments: RN into assist as pts first time OOB; cues for techinque and increased time due to pain control  Transfers Overall transfer level: Needs assistance Equipment  used: Rolling walker (2 wheeled) Transfers: Sit to/from Stand Sit to Stand: Min assist;From elevated surface         General transfer comment: cues for safe hand placement  Ambulation/Gait Ambulation/Gait assistance: Min assist Gait Distance (Feet): 5 Feet Assistive device: Rolling walker (2 wheeled) Gait Pattern/deviations: Step-to pattern;Decreased stride length;Decreased weight shift to right     General Gait Details: side steps to [facility]; pt expressing that he was somewhat dizzy so did not ambulate away from bed; all VSS  Stairs            Wheelchair Mobility    Modified Rankin (Stroke Patients Only)       Balance Overall balance assessment: Needs assistance Sitting-balance support: Bilateral upper extremity supported;Feet supported Sitting balance-Leahy Scale: Good     Standing balance support: Bilateral upper extremity supported;During functional activity Standing balance-Leahy Scale: Fair                               Pertinent Vitals/Pain Pain Assessment: 0-10 Pain Score: 6  Pain Location: R hip Pain Descriptors / Indicators: Sharp Pain Intervention(s): Limited activity within patient's tolerance;Repositioned;Monitored during session(only hurt with movement; no pain at rest)    Home Living Family/patient expects to be discharged to:: Private residence Living Arrangements: Alone Available Help at Discharge: Family;Available PRN/intermittently Type of Home: House Home Access: Stairs to enter Entrance Stairs-Rails: Right;Left;Can reach both Entrance Stairs-Number of Steps: 3 Home Layout: One level Home Equipment: Cane - single point;Walker - 2 wheels;Shower seat  Prior Function Level of Independence: Independent         Comments: pt reports he was ambulating with no device prior to this fall     Hand Dominance        Extremity/Trunk Assessment   Upper Extremity Assessment Upper Extremity Assessment: Overall WFL for tasks  assessed    Lower Extremity Assessment Lower Extremity Assessment: RLE deficits/detail;LLE deficits/detail RLE Deficits / Details: ROM WFL (less than left due to pain); demonstrating 3/5 ankle and knee ext; 2/5 hip LLE Deficits / Details: WFL for task assessed - difficulty with MMT commands due to [facility] (with mask and negative pressure making this worse)       Communication   Communication: HOH  Cognition Arousal/Alertness: Awake/alert Behavior During Therapy: WFL for tasks assessed/performed Overall Cognitive Status: Within Functional Limits for tasks assessed                                 General Comments: Pt needing encouragment that he is doing well.  He frequently expressed that he felt he wasn't doing good and may not make it.      General Comments      Exercises General Exercises - Lower Extremity Ankle Circles/Pumps: AROM;Both;10 reps;Supine Short Arc Quad: AROM;Both;15 reps;Supine Heel Slides: AAROM;Right;10 reps   Assessment/Plan    PT Assessment Patient needs continued PT services  PT Problem List Decreased strength;Decreased mobility;Decreased safety awareness;Decreased range of motion;Decreased activity tolerance;Decreased balance;Decreased knowledge of use of DME       PT Treatment Interventions DME instruction;Therapeutic activities;Gait training;Therapeutic exercise;Patient/family education;Modalities;Stair training;Balance training;Functional mobility training    PT Goals (Current goals can be found in the Care Plan section)  Acute Rehab PT Goals Patient Stated Goal: return to walking PT Goal Formulation: With patient Time For Goal Achievement: 07/02/19 Potential to Achieve Goals: Good Additional Goals Additional Goal #1: R hip strength to 3/5 to assist with transfeers    Frequency Min 3X/week   Barriers to discharge Decreased caregiver support      Co-evaluation               AM-PAC PT "6 Clicks" Mobility  Outcome Measure  Help needed turning from your back to your side while in a flat bed without using bedrails?: A Lot Help needed moving from lying on your back to sitting on the side of a flat bed without using bedrails?: A Lot Help needed moving to and from a bed to a chair (including a wheelchair)?: A Little Help needed standing up from a chair using your arms (e.g., wheelchair or bedside chair)?: A Little Help needed to walk in hospital room?: A Little Help needed climbing 3-5 steps with a railing? : A Lot 6 Click Score: 15    End of Session Equipment Utilized During Treatment: Gait belt Activity Tolerance: Patient tolerated treatment well Patient left: in bed;with bed alarm set;with SCD's reapplied;with call bell/phone within reach Nurse Communication: Mobility status PT Visit Diagnosis: Other abnormalities of gait and mobility (R26.89);Repeated falls (R29.6);Muscle weakness (generalized) (M62.81)    Time: 1320-1400 PT Time Calculation (min) (ACUTE ONLY): 40 min   Charges:   PT Evaluation $PT Eval Moderate Complexity: 1 Mod PT Treatments $Therapeutic Exercise: 8-22 mins        Maggie Font, PT Acute Rehab Services Pager [phone] [facility] [phone] [facility] [phone]   Karlton Lemon 06/18/2019, 2:14 PM

## 2019-06-18 NOTE — Progress Notes (Signed)
Hypoglycemic Event  CBG: 66  Treatment: 8 oz juice    Follow-up CBG: Time: 1038 CBG Result:105     Josph Macho

## 2019-06-18 NOTE — Progress Notes (Addendum)
    Subjective:  Patient reports pain as mild to moderate.  Denies N/V/CP/SOB.   Objective:   VITALS:   Vitals:   06/17/19 2217 06/18/19 0000 06/18/19 0400 06/18/19 0500  BP: 107/61 135/87 131/66   Pulse: 61 63 (!) 58   Resp: 11 (!) 9 12   Temp:  (!) 97.4 F (36.3 C) 97.7 F (36.5 C)   TempSrc:  Oral Oral   SpO2: 94% 95% 93%   Weight:    83.5 kg  Height:        NAD, sitting in chair ABD soft Sensation intact distally Intact pulses distally Dorsiflexion/Plantar flexion intact Incision: dressing C/D/I Compartment soft   Lab Results  Component Value Date   WBC 10.5 06/18/2019   HGB 9.9 (L) 06/18/2019   HCT 31.5 (L) 06/18/2019   MCV 91.8 06/18/2019   PLT 210 06/18/2019   BMET    Component Value Date/Time   NA 132 (L) 06/18/2019 0303   NA 141 03/02/2017 0753   K 4.0 06/18/2019 0303   CL 102 06/18/2019 0303   CO2 23 06/18/2019 0303   GLUCOSE 136 (H) 06/18/2019 0303   GLUCOSE 100 (H) 05/05/2006 0839   BUN 30 (H) 06/18/2019 0303   BUN 26 03/02/2017 0753   CREATININE 0.81 06/18/2019 0303   CREATININE 0.99 03/10/2014 1445   CALCIUM 8.9 06/18/2019 0303   GFRNONAA >60 06/18/2019 0303   GFRAA >60 06/18/2019 0303     Assessment/Plan: 1 Day Post-Op   Principal Problem:   Hip fracture (HCC) Active Problems:   HLD (hyperlipidemia)   Essential hypertension   CAD, ARTERY BYPASS GRAFT   Chronic systolic heart failure (HCC)   WBAT with walker DVT ppx: Lovenox, SCDs, TEDS PO pain control PT/OT Dispo: D/C home with HHPT   Jesse [name] 06/18/2019, 1:53 PM   Rod Can, MD [phone] [place] [facility] now [facility]  [place] [address], [address], [place], [place] [address] Phone: [phone] [email or web address] Facebook  [employer]

## 2019-06-18 NOTE — Progress Notes (Signed)
PROGRESS NOTE    Jesse [name]  VFI:[id number] DOB: 22-Sep-1927 DOA: 06/15/2019 PCP: [name], MD   Brief Narrative:  HPI per Dr. Shela Leff on 06/15/2019 Jesse [name] is a 85 y.o. male with medical history significant of AAA, arthritis, history of prostate cancer, CAD status post CABG, chronic systolic congestive heart failure, GERD, hypertension, hyperlipidemia, RBBB presenting to the ED for evaluation of right hip pain after a fall.  Patient states he was walking in his driveway and stepped on a stone.  He then bent down to pick up the stone and as he was trying to throw it away he lost his balance and fell on his right hip.  Since then he is having pain in this area.  He did not hit his head or lose consciousness.  He was not having any lightheadedness/dizziness, chest pain, or shortness of breath prior to the fall.  States he has had problems with his balance for the past few years and 2 months ago fell and fractured his left hip.  ED Course: Afebrile and hemodynamically stable.  No leukocytosis.  Hemoglobin 11.6, above baseline.  SARS-CoV-2 test pending.  Chest x-ray showing stable mild cardiomegaly without overt pulmonary edema.  Stable mild left basilar scarring and no acute consolidative airspace disease.  X-ray showing acute intertrochanteric proximal right femur fracture without significant displacement.  No right hip dislocation.  X-ray of right knee pending.  Ortho planning on taking the patient to the OR in the morning.  Keep n.p.o. after midnight.  **Interim History  Currently the patient is asymptomatic from his Covid disease and states that he did have some sneezing on Tuesday and Wednesday but went to go see his PCP and was given medications for sinuses. Continues to have some nasal congestion. Surgery had been postponed a day but was done 06/17/2019.  Patient was started on IV steroids yesterday for his COVID-19 disease and was also started on remdesivir this morning given  his slight increase in inflammatory markers with a CRP.  Currently denies any current symptoms but still complains of some abdominal pain when he moves his leg.  He is relatively stable from a respiratory standpoint and inflammatory markers have started trending downwards now  Assessment & Plan:   Principal Problem:   Hip fracture ([place]) Active Problems:   HLD (hyperlipidemia)   Essential hypertension   CAD, ARTERY BYPASS GRAFT   Chronic systolic heart failure (HCC)  COVID-19 Infection, currently asymptomatic  -SARS-CoV-2 Testing was Positive and likely incidental Finding  -Currently does not have any Respiratory Symptoms and appears Asymptomatic -Initial CXR showed "Stable mild cardiomegaly without overt pulmonary edema. Stable mild left basilar scarring. No acute consolidative airspace disease." -Repeat CXR yesterday AM showed "Stable cardiomegaly. No pneumothorax or pleural effusion is noted. Sternotomy wires are noted. No acute pulmonary disease is noted. Bony thorax is unremarkable." -C/w Airborne and Contact Precautions -Checked Baseline Inflammatory Markers and trend daily; baseline inflammatory markers obtained and showed an LDH of 141, CRP of 3.7, procalcitonin level of less than 0.10, ESR of 9, D-dimer of 11.74, and fibrinogen of 315 -Trending Inflammatory markers as below: Recent Labs    06/16/19 0900 06/17/19 0211 06/18/19 0303 06/18/19 0854  DDIMER 11.74* 5.62* 3.87*  --   FERRITIN 261 216  --  208  LDH 141 134  --  132  CRP 3.7* 7.6*  --  3.9*  -ESR went from 9 -> 6 -> 5 -Fibrinogen went from 315 -> 344 -> 257 Lab  Results  Component Value Date   SARSCOV2NAA POSITIVE (A) 06/15/2019   [place] NEGATIVE 04/19/2019   [place] NEGATIVE 04/15/2019  SpO2: 93 % -Supportive Care and will add Zinc and Vitamin C Daily -C/w Antitussives with Tussionex and Robitussin DM -Started Albuterol Inhaler 1-2 puff q4hprn -Given Decadron 10 mg x1 yesterday and 6 mg daily after  -Also started Remdesivir yesterday and will go 5 Days of treatment at least  -MRSA PCR was Negative  -Repeat CXR in AM   Right hip fracture secondary to a mechanical fall s/p Right Intramedullary Fixation of the Right Femur POD1 -X-ray showing acute intertrochanteric proximal right femur fracture without significant displacement.  No right hip dislocation.  Ortho planning on taking the patient to the OR in the morning. -Initially Kept n.p.o for Surgery and now on a Regular Diet -No anticoagulation for VTE prophylaxis at this time per Ortho request but now on Lovenox 40 mg po Daily  -C/w Morphine 0.5 mg as needed q2h for Severe Pain, Norco as needed 1-2 tablet q6h -Robaxin as needed -Bowel Regimen -Preop Cefazolin ordered -Appreciate Ortho Assistance and Further Evaluation and Management  -Defer to Ortho to restart pharmacological prophylaxis and they wanted Lovenox -Will need PT/OT evaluate -WB Instructions per Ortho   Chronic systolic congestive heart failure -Currently Euvolemic. -Continue Carvedilol 12.5 mg po BID and Losartan 50 mg po Daily  -Hold on IV fluid hydration at this time but will give IV sodium phosphate 20 mmol -Strict I's and O's and Daily Weights; I's and O's are inaccurate as patient is with +845 mL since admission -Weight was up 13 pounds but question if this is accurate or not and is now down 4 from yesterday; Patient is 184 lbs -Continue to monitor for signs and symptoms of volume overload  CAD status post CABG -Stable. No anginal symptoms. -Continue Carvedilol 12.5 mg po BID, Losartan 50 mg po daily and Simvastatin 40 mg po qHS  Hypertension -Stable. -Continue Carvedilol 12.5 mg po BID, Losartan 50 mg po daily -BP this morning was 131/66  Hyperlipidemia -Continue Simvastatin 40 mg po qHS and Fenofibrate 160 mg po Daily n.p.o. resumption takes place  GERD -Continue PPI with Pantoprazole 40 mg po Daily   Metabolic Acidosis -Mild. -CO2 was 20,  Chloride was 110, and AG was 6 -Continue to Monitor and Trend and repeat this AM showed a CO2 of 23, chloride level of 102, anion gap of 7 -Given a dose of sodium phosphate 20 mmol yesterday and will hold further fluids at this time -Continue to monitor and repeat CMP in AM  Normocytic Anemia/Post-Operative Anemia Drop -Paitent's Hb/Hct was 11.6/36.8 on Admission and dropped to 9.9/31.5 and in the setting of Post-Operative Drop form Blood Loss Anemia  -C/w Ferrosu sulfate 325 mg po Daily  -Checked Anemia Panel and showed an iron level of 41, U IBC of 252, TIBC of 293, saturation ratios of 14%, ferritin level 261, folate level 1.7, vitamin B12 level of 1172 -Continue to Monitor for S/Sx of Bleeding; Currently no overt bleeding noted -Repeat CBC in AM  Hyponatremia -Mild and sodium went from 130 -> 134 -> 132 -Mau consider starting gentle IVF Hydration if not improving  -Continue to monitor and trend -Repeat CMP in a.m.  Hypophosphatemia -Patient's Phosphorus level was 2.6 this AM -Continue to monitor and replete as necessary -Repeat phosphorus level in a.m.  Elevated BUN -In the setting of steroid demargination patient was placed on Decadron -Patient's BUN went from 19 -> 24 -> 30 -  Continue to monitor and repeat CMP in a.m.  Hyperglycemia -In the setting of Steroid Demargination -Patient's Blood Sugar on admission was 94; Has been ranging from 93-136 on Daily BMP/CMP; Later on this AM he was Slightly Hypoglycemic with a Blood Sugar of 66 -Check HbA1c in the AM -Will check Accucheks q4h and place on Sensitive Novolog SSI to monitor Glycemic Control  -Continue to Monitor Blood Sugars Carefully    DVT prophylaxis: Pharmacological Prophylaxis held in anticipation of Surgery but now resumed with Enoxaparin 40 mg sq q24h Code Status: DO NOT RESUSCITATE  Family Communication: No family present at bedside Disposition Plan: Pending further work-up and evaluation by PT/OT  Consultants:    Orthopedic surgery Dr. Lyla Glassing   Procedures:  PROCEDURE: Intramedullary fixation, Right femur. Done By Dr. Lyla Glassing on 06/17/2019   Antimicrobials:  Anti-infectives (From admission, onward)   Start     Dose/Rate Route Frequency Ordered Stop   06/18/19 1000  remdesivir 100 mg in sodium chloride 0.9 % 250 mL IVPB     100 mg 500 mL/hr over 30 Minutes Intravenous Every 24 hours 06/17/19 0750 06/22/19 0959   06/17/19 1600  ceFAZolin (ANCEF) IVPB 2g/100 mL premix     2 g 200 mL/hr over 30 Minutes Intravenous Every 6 hours 06/17/19 1431 06/17/19 2203   06/17/19 1142  ceFAZolin (ANCEF) 2-4 GM/100ML-% IVPB    Note to Pharmacy: [place]  : cabinet override      06/17/19 1142 06/17/19 1528   06/17/19 1000  remdesivir 200 mg in sodium chloride 0.9 % 250 mL IVPB     200 mg 500 mL/hr over 30 Minutes Intravenous Once 06/17/19 0750 06/17/19 0911   06/16/19 0600  ceFAZolin (ANCEF) IVPB 2g/100 mL premix  Status:  Discontinued     2 g 200 mL/hr over 30 Minutes Intravenous On call to O.R. 06/15/19 2249 06/17/19 0559   06/16/19 0600  ceFAZolin (ANCEF) IVPB 2g/100 mL premix  Status:  Discontinued     2 g 200 mL/hr over 30 Minutes Intravenous On call to O.R. 06/15/19 2252 06/15/19 2308     Subjective: Seen and examined at bedside and he is feeling well.  Complains of some pain when he removes Allegra but no nausea or vomiting.  Still has some nasal congestion.  No lightheadedness or dizziness.  Is not short of breath or coughing.  No other concerns or points at this time.  Objective: Vitals:   06/17/19 2217 06/18/19 0000 06/18/19 0400 06/18/19 0500  BP: 107/61 135/87 131/66   Pulse: 61 63 (!) 58   Resp: 11 (!) 9 12   Temp:  (!) 97.4 F (36.3 C) 97.7 F (36.5 C)   TempSrc:  Oral Oral   SpO2: 94% 95% 93%   Weight:    83.5 kg  Height:        Intake/Output Summary (Last 24 hours) at 06/18/2019 1402 Last data filed at 06/18/2019 0600 Gross per 24 hour  Intake 2088.42 ml  Output  1000 ml  Net 1088.42 ml   Filed Weights   06/16/19 0752 06/17/19 0432 06/18/19 0500  Weight: 85.3 kg 85.6 kg 83.5 kg   Examination: Physical Exam:  Constitutional: WN/WD elderly Caucasian male currently in no acute distress but he is still somewhat anxious,  Eyes: Lids and conjunctivae normal, sclerae anicteric  ENMT: External Ears, Nose appear normal. Grossly normal hearing. Mucous membranes are moist. Neck: Appears normal, supple, no cervical masses, normal ROM, no appreciable thyromegaly; no JVD Respiratory: Diminished to  auscultation bilaterally, no wheezing, rales, rhonchi or crackles. Normal respiratory effort and patient is not tachypenic. No accessory muscle use.  Unlabored breathing and he is not wearing any supplemental oxygen via nasal cannula Cardiovascular: RRR, no murmurs / rubs / gallops. S1 and S2 auscultated. No extremity edema.  Abdomen: Soft, non-tender, Distended secondary to his body habitus and prior surgical scars with palpable mesh hernia noted. Bowel sounds positive.  GU: Deferred. Musculoskeletal: No clubbing / cyanosis of digits/nails. No joint deformity upper and lower extremities.  Skin: No rashes, lesions, ulcers and hip incisions appeared clean dry and intact. No induration; Warm and dry.  Neurologic: CN 2-12 grossly intact with no focal deficits. Romberg sign and cerebellar reflexes not assessed.  Psychiatric: Normal judgment and insight. Alert and oriented x 3. Anxious mood and appropriate affect.    Data Reviewed: I have personally reviewed following labs and imaging studies  CBC: Recent Labs  Lab 06/15/19 2046 06/16/19 0900 06/17/19 0211 06/18/19 0303  WBC 6.5 4.6 4.4 10.5  NEUTROABS  --  3.5 3.9 9.5*  HGB 11.6* 11.7* 11.2* 9.9*  HCT 36.8* 37.1* 35.8* 31.5*  MCV 93.6 93.2 93.0 91.8  PLT 215 217 217 826   Basic Metabolic Panel: Recent Labs  Lab 06/15/19 2046 06/16/19 0900 06/17/19 0211 06/18/19 0303  NA 136 130* 134* 132*  K 3.5 3.5  4.2 4.0  CL 110 100 105 102  CO2 20* 21* 21* 23  GLUCOSE 94 93 180* 136*  BUN 20 19 24* 30*  CREATININE 0.79 0.84 0.79 0.81  CALCIUM 8.0* 8.2* 9.1 8.9  MG  --  1.8 1.8  --   PHOS  --  2.1* 2.6 2.6   GFR: Estimated Creatinine Clearance: 67.9 mL/min (by C-G formula based on SCr of 0.81 mg/dL). Liver Function Tests: Recent Labs  Lab 06/15/19 2046 06/16/19 0900 06/17/19 0211 06/18/19 0303  AST '20 23 20 19  '$ ALT '14 14 14 14  '$ ALKPHOS 54 56 50 45  BILITOT 0.7 1.2 0.6 0.6  PROT 5.6* 5.8* 5.6* 5.1*  ALBUMIN 3.1* 3.4* 3.1* 2.6*   No results for input(s): LIPASE, AMYLASE in the last 168 hours. No results for input(s): AMMONIA in the last 168 hours. Coagulation Profile: No results for input(s): INR, PROTIME in the last 168 hours. Cardiac Enzymes: No results for input(s): CKTOTAL, CKMB, CKMBINDEX, TROPONINI in the last 168 hours. BNP (last 3 results) No results for input(s): PROBNP in the last 8760 hours. HbA1C: No results for input(s): HGBA1C in the last 72 hours. CBG: No results for input(s): GLUCAP in the last 168 hours. Lipid Profile: No results for input(s): CHOL, HDL, LDLCALC, TRIG, CHOLHDL, LDLDIRECT in the last 72 hours. Thyroid Function Tests: No results for input(s): TSH, T4TOTAL, FREET4, T3FREE, THYROIDAB in the last 72 hours. Anemia Panel: Recent Labs    06/16/19 0900 06/17/19 0211 06/18/19 0854  VITAMINB12 1,172*  --   --   FOLATE 11.7  --   --   FERRITIN 261 216 208  TIBC 293  --   --   IRON 41*  --   --   RETICCTPCT 0.7  --   --    Sepsis Labs: Recent Labs  Lab 06/16/19 0900 06/17/19 0211  PROCALCITON <0.10 <0.10  LATICACIDVEN 0.9  --     Recent Results (from the past 240 hour(s))  SARS CORONAVIRUS 2 (TAT 6-24 HRS) Nasopharyngeal Nasopharyngeal Swab     Status: Abnormal   Collection Time: 06/15/19  8:46 PM  Specimen: Nasopharyngeal Swab  Result Value Ref Range Status   SARS Coronavirus 2 POSITIVE (A) NEGATIVE Final    Comment: RESULT CALLED TO,  READ BACK BY AND VERIFIED WITH: Alanson Aly, RN AT [phone] ON 06/16/2019 BY SAINVILUS S (NOTE) SARS-CoV-2 target nucleic acids are DETECTED. The SARS-CoV-2 RNA is generally detectable in upper and lower respiratory specimens during the acute phase of infection. Positive results are indicative of active infection with SARS-CoV-2. Clinical  correlation with patient history and other diagnostic information is necessary to determine patient infection status. Positive results do  not rule out bacterial infection or co-infection with other viruses. The expected result is Negative. Fact Sheet for Patients: SugarRoll.be Fact Sheet for Healthcare Providers: https://www.woods-mathews.com/ This test is not yet approved or cleared by the [place] FDA and  has been authorized for detection and/or diagnosis of SARS-CoV-2 by FDA under an Emergency Use Authorization (EUA). This EUA will remain  in effect (meaning this test  can be used) for the duration of the COVID-19 declaration under Section 564(b)(1) of the Act, 21 U.S.C. section 360bbb-3(b)(1), unless the authorization is terminated or revoked sooner. Performed at [facility], [place] [address] [place], [place] [address]   Surgical pcr screen     Status: None   Collection Time: 06/16/19  1:35 AM   Specimen: Nasal Mucosa; Nasal Swab  Result Value Ref Range Status   MRSA, PCR NEGATIVE NEGATIVE Final   Staphylococcus aureus NEGATIVE NEGATIVE Final    Comment: (NOTE) The Xpert SA Assay (FDA approved for NASAL specimens in patients 80 years of age and older), is one component of a comprehensive surveillance program. It is not intended to diagnose infection nor to guide or monitor treatment. Performed at [facility], [place] [address] [place], [place] [address]     Radiology Studies: Dg Chest Port 1 View  Result Date: 06/17/2019 CLINICAL DATA:  Shortness of breath. EXAM:  PORTABLE CHEST 1 VIEW COMPARISON:  June 15, 2019. FINDINGS: Stable cardiomegaly. No pneumothorax or pleural effusion is noted. Sternotomy wires are noted. No acute pulmonary disease is noted. Bony thorax is unremarkable. IMPRESSION: No active disease. Electronically Signed   By: Marijo Conception M.D.   On: 06/17/2019 07:32   Dg C-arm 1-60 Min-no Report  Result Date: 06/17/2019 Fluoroscopy was utilized by the requesting physician.  No radiographic interpretation.   Dg Hip Operative Unilat W Or W/o Pelvis Right  Result Date: 06/17/2019 CLINICAL DATA:  Status post surgical internal fixation of right proximal femur fracture. EXAM: OPERATIVE right HIP (WITH PELVIS IF PERFORMED) 3 VIEWS TECHNIQUE: Fluoroscopic spot image(s) were submitted for interpretation post-operatively. FLUOROSCOPY TIME:  1 minutes 23 seconds. COMPARISON:  June 15, 2019. FINDINGS: Three intraoperative fluoroscopic images were obtained of the right hip. These demonstrate surgical internal fixation of proximal right femoral intertrochanteric fracture. IMPRESSION: Status post surgical internal fixation of proximal right femoral intertrochanteric fracture. Electronically Signed   By: Marijo Conception M.D.   On: 06/17/2019 14:17   Scheduled Meds: . carvedilol  12.5 mg Oral BID WC  . Chlorhexidine Gluconate Cloth  6 each Topical Daily  . dexamethasone (DECADRON) injection  6 mg Intravenous Q24H  . docusate sodium  100 mg Oral BID  . enoxaparin (LOVENOX) injection  40 mg Subcutaneous Q24H  . fenofibrate  160 mg Oral Daily  . ferrous sulfate  325 mg Oral Q breakfast  . loratadine  10 mg Oral Daily  . losartan  50 mg Oral Daily  .  mouth rinse  15 mL Mouth Rinse BID  . pantoprazole  40 mg Oral Daily  . polyvinyl alcohol  1-2 drop Both Eyes QHS  . simvastatin  40 mg Oral q1800  . sodium chloride  2 spray Each Nare BID  . vitamin C  500 mg Oral Daily  . zinc sulfate  220 mg Oral Daily   Continuous Infusions: . methocarbamol  (ROBAXIN) IV Stopped (06/16/19 0215)  . remdesivir 100 mg in NS 250 mL      LOS: 3 days   Kerney Elbe, DO [facility] PAGER is on [place]  If 7PM-7AM, please contact night-coverage [email or web address]

## 2019-06-19 DIAGNOSIS — U071 COVID-19: Secondary | ICD-10-CM

## 2019-06-19 DIAGNOSIS — S72144D Nondisplaced intertrochanteric fracture of right femur, subsequent encounter for closed fracture with routine healing: Secondary | ICD-10-CM

## 2019-06-19 HISTORY — DX: COVID-19: U07.1

## 2019-06-19 LAB — CBC WITH DIFFERENTIAL/PLATELET
Abs Immature Granulocytes: 0.03 10*3/uL (ref 0.00–0.07)
Basophils Absolute: 0 10*3/uL (ref 0.0–0.1)
Basophils Relative: 0 %
Eosinophils Absolute: 0 10*3/uL (ref 0.0–0.5)
Eosinophils Relative: 0 %
HCT: 30.2 % — ABNORMAL LOW (ref 39.0–52.0)
Hemoglobin: 9.7 g/dL — ABNORMAL LOW (ref 13.0–17.0)
Immature Granulocytes: 0 %
Lymphocytes Relative: 3 %
Lymphs Abs: 0.3 10*3/uL — ABNORMAL LOW (ref 0.7–4.0)
MCH: 29.5 pg (ref 26.0–34.0)
MCHC: 32.1 g/dL (ref 30.0–36.0)
MCV: 91.8 fL (ref 80.0–100.0)
Monocytes Absolute: 0.5 10*3/uL (ref 0.1–1.0)
Monocytes Relative: 7 %
Neutro Abs: 7.1 10*3/uL (ref 1.7–7.7)
Neutrophils Relative %: 90 %
Platelets: 208 10*3/uL (ref 150–400)
RBC: 3.29 MIL/uL — ABNORMAL LOW (ref 4.22–5.81)
RDW: 13.7 % (ref 11.5–15.5)
WBC: 7.9 10*3/uL (ref 4.0–10.5)
nRBC: 0 % (ref 0.0–0.2)

## 2019-06-19 LAB — COMPREHENSIVE METABOLIC PANEL
ALT: 11 U/L (ref 0–44)
AST: 15 U/L (ref 15–41)
Albumin: 2.8 g/dL — ABNORMAL LOW (ref 3.5–5.0)
Alkaline Phosphatase: 40 U/L (ref 38–126)
Anion gap: 6 (ref 5–15)
BUN: 28 mg/dL — ABNORMAL HIGH (ref 8–23)
CO2: 23 mmol/L (ref 22–32)
Calcium: 9 mg/dL (ref 8.9–10.3)
Chloride: 104 mmol/L (ref 98–111)
Creatinine, Ser: 0.72 mg/dL (ref 0.61–1.24)
GFR calc Af Amer: >60 mL/min (ref >60)
GFR calc non Af Amer: >60 mL/min (ref >60)
Glucose, Bld: 129 mg/dL — ABNORMAL HIGH (ref 70–99)
Potassium: 4.4 mmol/L (ref 3.5–5.1)
Sodium: 133 mmol/L — ABNORMAL LOW (ref 135–145)
Total Bilirubin: 0.9 mg/dL (ref 0.3–1.2)
Total Protein: 4.9 g/dL — ABNORMAL LOW (ref 6.5–8.1)

## 2019-06-19 LAB — D-DIMER, QUANTITATIVE: D-Dimer, Quant: 3.01 ug/mL-FEU — ABNORMAL HIGH (ref 0.00–0.50)

## 2019-06-19 LAB — FERRITIN: Ferritin: 184 ng/mL (ref 24–336)

## 2019-06-19 LAB — SEDIMENTATION RATE: Sed Rate: 7 mm/hr (ref 0–16)

## 2019-06-19 LAB — MAGNESIUM: Magnesium: 2 mg/dL (ref 1.7–2.4)

## 2019-06-19 LAB — GLUCOSE, CAPILLARY
Glucose-Capillary: 105 mg/dL — ABNORMAL HIGH (ref 70–99)
Glucose-Capillary: 97 mg/dL (ref 70–99)
Glucose-Capillary: 130 mg/dL — ABNORMAL HIGH (ref 70–99)
Glucose-Capillary: 139 mg/dL — ABNORMAL HIGH (ref 70–99)

## 2019-06-19 LAB — FIBRINOGEN: Fibrinogen: 247 mg/dL (ref 210–475)

## 2019-06-19 LAB — PHOSPHORUS: Phosphorus: 1.8 mg/dL — ABNORMAL LOW (ref 2.5–4.6)

## 2019-06-19 LAB — LACTATE DEHYDROGENASE: LDH: 122 U/L (ref 98–192)

## 2019-06-19 LAB — C-REACTIVE PROTEIN: CRP: 2.9 mg/dL — ABNORMAL HIGH (ref <1.0)

## 2019-06-19 MED ORDER — DIPHENHYDRAMINE HCL 25 MG PO CAPS
25.0000 mg | ORAL_CAPSULE | Freq: Three times a day (TID) | ORAL | Status: DC | PRN
  Administered 2019-06-20 – 2019-06-23 (×3): 25 mg via ORAL
  Filled 2019-06-19 (×3): qty 1

## 2019-06-19 NOTE — Progress Notes (Addendum)
PROGRESS NOTE                                                                                                                                                                                                             Patient Demographics:    Jesse [name], is a 85 y.o. male, DOB - 06/29/1928, LKJ:[id number]  Admit date - 06/15/2019   Admitting Physician Shela Leff, MD  Outpatient Primary MD for the patient is [name], MD  LOS - 4  Outpatient Specialists:  Chief Complaint  Patient presents with  . Fall  . Hip Pain       Brief Narrative  85 y.o.malewith medical history significant ofAAA, arthritis, history of prostate cancer, CAD status post CABG, chronic systolic congestive heart failure, GERD, hypertension, hyperlipidemia, RBBB presenting to the ED with right hip pain after a mechanical fall.  Patient found to have acute intertrochanteric nondisplaced proximal right femur fracture.  Patient reported having some URI symptoms (sneezing) prior to admission but his vitals were stable and maintaining sats on room air.  Chest x-ray without acute findings.  He was tested positive for COVID-19. Patient taken to the OR on 11/23 and underwent intramedullary fixation of the right femur.  Tolerated surgery well.     Subjective:   Patient reports that his right hip pain has started to get better and controlled on current pain meds.  Denies any shortness of breath, cough, nasal congestion, chest pain or fever. Maintaining sats on RA.   Assessment  & Plan :    Principal Problem: Right intertrochanteric femur fracture, nondisplaced, Underwent IM fixation of the right femur on 11/23.  Tolerated well.  Orthopedic consult appreciated.  Pain control as needed (Norco and Robaxin).  Bowel regimen.  Up with therapy.  Worked with PT yesterday and recommends SNF. DVT prophylaxis with subcu Lovenox.  COVID-19 infection Patient  does not have any respiratory symptoms throughout his hospital stay so far.  He had elevated ESR, D-dimer and CRP which are trending down well.  Fibrinogen was normal. He was started on remdesivir given elevated inflammatory markers on 11/23 (day 3/5).  Also on Decadron 6 mg daily. Patient initially awaiting transfer to [place] initially but given that he has been quite stable here without any respiratory distress and  inflammatory markers trending down, I decided to keep  him here.  However with better nursing shortage on the Covid floor, patient will be transferred to [place] for further management.  Active problems CAD with history of CABG Stable.  Continue Coreg, losartan and statin.  Essential hypertension Stable on home meds  GERD Continue PPI  Normocytic/postoperative blood loss anemia Stable.  Hyperglycemia Mild secondary to steroid use.  Monitor for now.    Code Status : DNR  Family Communication  : None at bedside  Disposition Plan  : Needs SNF after completion of remdesivir on 11/27  Barriers For Discharge : Active symptoms  Consults  : Orthopedics (Dr. Lyla Glassing)  Procedures  : IM fixation right hip  DVT Prophylaxis  : Subcu Lovenox  Lab Results  Component Value Date   PLT 208 06/19/2019    Antibiotics  :    Anti-infectives (From admission, onward)   Start     Dose/Rate Route Frequency Ordered Stop   06/18/19 1000  remdesivir 100 mg in sodium chloride 0.9 % 250 mL IVPB     100 mg 500 mL/hr over 30 Minutes Intravenous Every 24 hours 06/17/19 0750 06/22/19 0959   06/17/19 1600  ceFAZolin (ANCEF) IVPB 2g/100 mL premix     2 g 200 mL/hr over 30 Minutes Intravenous Every 6 hours 06/17/19 1431 06/17/19 2203   06/17/19 1142  ceFAZolin (ANCEF) 2-4 GM/100ML-% IVPB    Note to Pharmacy: [place]  : cabinet override      06/17/19 1142 06/17/19 1528   06/17/19 1000  remdesivir 200 mg in sodium chloride 0.9 % 250 mL IVPB     200 mg  500 mL/hr over 30 Minutes Intravenous Once 06/17/19 0750 06/17/19 0911   06/16/19 0600  ceFAZolin (ANCEF) IVPB 2g/100 mL premix  Status:  Discontinued     2 g 200 mL/hr over 30 Minutes Intravenous On call to O.R. 06/15/19 2249 06/17/19 0559   06/16/19 0600  ceFAZolin (ANCEF) IVPB 2g/100 mL premix  Status:  Discontinued     2 g 200 mL/hr over 30 Minutes Intravenous On call to O.R. 06/15/19 2252 06/15/19 2308        Objective:   Vitals:   06/18/19 2000 06/19/19 0000 06/19/19 0335 06/19/19 0400  BP: 136/82 140/74  121/63  Pulse: 71 62  (!) 55  Resp: '18 16  17  '$ Temp: 97.9 F (36.6 C) (!) 97.5 F (36.4 C)  97.6 F (36.4 C)  TempSrc: Axillary Oral  Axillary  SpO2: 94% 93%  93%  Weight:   84 kg   Height:        Wt Readings from Last 3 Encounters:  06/19/19 84 kg  05/30/19 80.4 kg  04/16/19 80.3 kg     Intake/Output Summary (Last 24 hours) at 06/19/2019 1113 Last data filed at 06/19/2019 0500 Gross per 24 hour  Intake 250 ml  Output 900 ml  Net -650 ml     Physical Exam  Gen: Elderly male not in distress HEENT: no pallor, moist mucosa, supple neck Chest: clear b/l, no added sounds CVS: N S1&S2, no murmurs,  GI: soft, NT, ND, BS+ Musculoskeletal: warm, no edema, right hip dressing     Data Review:    CBC Recent Labs  Lab 06/15/19 2046 06/16/19 0900 06/17/19 0211 06/18/19 0303 06/19/19 0325  WBC 6.5 4.6 4.4 10.5 7.9  HGB 11.6* 11.7* 11.2* 9.9* 9.7*  HCT 36.8* 37.1* 35.8* 31.5* 30.2*  PLT 215 217 217 210 208  MCV 93.6 93.2 93.0 91.8 91.8  MCH 29.5 29.4 29.1 28.9 29.5  MCHC 31.5 31.5 31.3 31.4 32.1  RDW 13.8 13.9 13.8 13.7 13.7  LYMPHSABS  --  0.6* 0.3* 0.5* 0.3*  MONOABS  --  0.4 0.2 0.6 0.5  EOSABS  --  0.0 0.0 0.0 0.0  BASOSABS  --  0.0 0.0 0.0 0.0    Chemistries  Recent Labs  Lab 06/15/19 2046 06/16/19 0900 06/17/19 0211 06/18/19 0303 06/19/19 0325  NA 136 130* 134* 132* 133*  K 3.5 3.5 4.2 4.0 4.4  CL 110 100 105 102 104  CO2 20* 21*  21* 23 23  GLUCOSE 94 93 180* 136* 129*  BUN 20 19 24* 30* 28*  CREATININE 0.79 0.84 0.79 0.81 0.72  CALCIUM 8.0* 8.2* 9.1 8.9 9.0  MG  --  1.8 1.8  --  2.0  AST '20 23 20 19 15  '$ ALT '14 14 14 14 11  '$ ALKPHOS 54 56 50 45 40  BILITOT 0.7 1.2 0.6 0.6 0.9   ------------------------------------------------------------------------------------------------------------------ No results for input(s): CHOL, HDL, LDLCALC, TRIG, CHOLHDL, LDLDIRECT in the last 72 hours.  Lab Results  Component Value Date   HGBA1C 5.2 06/18/2019   ------------------------------------------------------------------------------------------------------------------ No results for input(s): TSH, T4TOTAL, T3FREE, THYROIDAB in the last 72 hours.  Invalid input(s): FREET3 ------------------------------------------------------------------------------------------------------------------ Recent Labs    06/18/19 0854 06/19/19 0325  FERRITIN 208 184    Coagulation profile No results for input(s): INR, PROTIME in the last 168 hours.  Recent Labs    06/18/19 0303 06/19/19 0325  DDIMER 3.87* 3.01*    Cardiac Enzymes No results for input(s): CKMB, TROPONINI, MYOGLOBIN in the last 168 hours.  Invalid input(s): CK ------------------------------------------------------------------------------------------------------------------    Component Value Date/Time   BNP 25.9 01/01/2016 1234    Inpatient Medications  Scheduled Meds: . carvedilol  12.5 mg Oral BID WC  . Chlorhexidine Gluconate Cloth  6 each Topical Daily  . dexamethasone (DECADRON) injection  6 mg Intravenous Q24H  . docusate sodium  100 mg Oral BID  . enoxaparin (LOVENOX) injection  40 mg Subcutaneous Q24H  . fenofibrate  160 mg Oral Daily  . ferrous sulfate  325 mg Oral Q breakfast  . insulin aspart  0-9 Units Subcutaneous Q4H  . loratadine  10 mg Oral Daily  . losartan  50 mg Oral Daily  . mouth rinse  15 mL Mouth Rinse BID  . pantoprazole  40 mg  Oral Daily  . polyvinyl alcohol  1-2 drop Both Eyes QHS  . simvastatin  40 mg Oral q1800  . sodium chloride  2 spray Each Nare BID  . sodium chloride flush  10-40 mL Intracatheter Q12H  . vitamin C  500 mg Oral Daily  . zinc sulfate  220 mg Oral Daily   Continuous Infusions: . methocarbamol (ROBAXIN) IV Stopped (06/16/19 0215)  . remdesivir 100 mg in NS 250 mL 100 mg (06/19/19 0949)   PRN Meds:.acetaminophen, albuterol, chlorpheniramine-HYDROcodone, guaiFENesin-dextromethorphan, HYDROcodone-acetaminophen, menthol-cetylpyridinium **OR** phenol, methocarbamol **OR** methocarbamol (ROBAXIN) IV, metoCLOPramide **OR** metoCLOPramide (REGLAN) injection, morphine injection, ondansetron **OR** ondansetron (ZOFRAN) IV, sodium chloride flush  Micro Results Recent Results (from the past 240 hour(s))  SARS CORONAVIRUS 2 (TAT 6-24 HRS) Nasopharyngeal Nasopharyngeal Swab     Status: Abnormal   Collection Time: 06/15/19  8:46 PM   Specimen: Nasopharyngeal Swab  Result Value Ref Range Status   SARS Coronavirus 2 POSITIVE (A) NEGATIVE Final    Comment: RESULT CALLED TO, READ BACK BY AND VERIFIED WITH: Alanson Aly, RN AT [phone] ON  06/16/2019 BY SAINVILUS S (NOTE) SARS-CoV-2 target nucleic acids are DETECTED. The SARS-CoV-2 RNA is generally detectable in upper and lower respiratory specimens during the acute phase of infection. Positive results are indicative of active infection with SARS-CoV-2. Clinical  correlation with patient history and other diagnostic information is necessary to determine patient infection status. Positive results do  not rule out bacterial infection or co-infection with other viruses. The expected result is Negative. Fact Sheet for Patients: SugarRoll.be Fact Sheet for Healthcare Providers: https://www.woods-mathews.com/ This test is not yet approved or cleared by the [place] FDA and  has been authorized for detection and/or  diagnosis of SARS-CoV-2 by FDA under an Emergency Use Authorization (EUA). This EUA will remain  in effect (meaning this test  can be used) for the duration of the COVID-19 declaration under Section 564(b)(1) of the Act, 21 U.S.C. section 360bbb-3(b)(1), unless the authorization is terminated or revoked sooner. Performed at [facility], [place] [address], [place], [place] [address]   Surgical pcr screen     Status: None   Collection Time: 06/16/19  1:35 AM   Specimen: Nasal Mucosa; Nasal Swab  Result Value Ref Range Status   MRSA, PCR NEGATIVE NEGATIVE Final   Staphylococcus aureus NEGATIVE NEGATIVE Final    Comment: (NOTE) The Xpert SA Assay (FDA approved for NASAL specimens in patients 15 years of age and older), is one component of a comprehensive surveillance program. It is not intended to diagnose infection nor to guide or monitor treatment. Performed at [facility], [place] [address], [place], [place] [address]     Radiology Reports Xr Chest Preop 1 View  Result Date: 06/15/2019 CLINICAL DATA:  Right hip fracture EXAM: CHEST  1 VIEW COMPARISON:  04/15/2019 chest radiograph. FINDINGS: Intact sternotomy wires. CABG clips overlie the mediastinum. Stable cardiomediastinal silhouette with mild cardiomegaly. No pneumothorax. No pleural effusion. No overt pulmonary edema. Mild left basilar scarring, stable. No acute consolidative airspace disease. IMPRESSION: 1. Stable mild cardiomegaly without overt pulmonary edema. 2. Stable mild left basilar scarring. No acute consolidative airspace disease. Electronically Signed   By: Ilona Sorrel M.D.   On: 06/15/2019 20:19   Dg Chest Port 1 View  Result Date: 06/17/2019 CLINICAL DATA:  Shortness of breath. EXAM: PORTABLE CHEST 1 VIEW COMPARISON:  June 15, 2019. FINDINGS: Stable cardiomegaly. No pneumothorax or pleural effusion is noted. Sternotomy wires are noted. No acute pulmonary disease is noted. Bony thorax is  unremarkable. IMPRESSION: No active disease. Electronically Signed   By: Marijo Conception M.D.   On: 06/17/2019 07:32   Dg Knee Right Port  Result Date: 06/15/2019 CLINICAL DATA:  Initial evaluation for acute trauma, fall. Knee pain. EXAM: PORTABLE RIGHT KNEE - 1-2 VIEW COMPARISON:  Prior radiograph from 04/15/2019 FINDINGS: Two cannulated lack fixation screws with cerclage wires again seen traversing the patella. Appearance is stable without hardware complication. No acute fracture or dislocation. No joint effusion. Mild osteoarthritic changes involving the patellofemoral articulation again noted. No visible acute soft tissue injury. Extensive atherosclerotic change noted about the knee with diffusely ectatic versus aneurysm involving the distal femoral and/or popliteal arteries. Osteopenia. IMPRESSION: 1. No acute osseous abnormality. 2. Sequelae of prior ORIF at the patella without hardware complication. 3. Aortic Atherosclerosis (ICD10-I70.0). Diffusely ectatic versus aneurysmal dilatation of the distal femoral and/or popliteal arteries. Finding could be further assessed with dedicated cross-sectional imaging as clinically warranted. Electronically Signed   By: Jeannine Boga M.D.   On: 06/15/2019 22:58  Dg C-arm 1-60 Min-no Report  Result Date: 06/17/2019 Fluoroscopy was utilized by the requesting physician.  No radiographic interpretation.   Dg Hip Operative Unilat W Or W/o Pelvis Right  Result Date: 06/17/2019 CLINICAL DATA:  Status post surgical internal fixation of right proximal femur fracture. EXAM: OPERATIVE right HIP (WITH PELVIS IF PERFORMED) 3 VIEWS TECHNIQUE: Fluoroscopic spot image(s) were submitted for interpretation post-operatively. FLUOROSCOPY TIME:  1 minutes 23 seconds. COMPARISON:  June 15, 2019. FINDINGS: Three intraoperative fluoroscopic images were obtained of the right hip. These demonstrate surgical internal fixation of proximal right femoral intertrochanteric  fracture. IMPRESSION: Status post surgical internal fixation of proximal right femoral intertrochanteric fracture. Electronically Signed   By: Marijo Conception M.D.   On: 06/17/2019 14:17   Xr Hip Right  Result Date: 06/15/2019 CLINICAL DATA:  Fall with right hip and pelvic pain EXAM: DG HIP (WITH OR WITHOUT PELVIS) 2-3V RIGHT COMPARISON:  04/15/2019 pelvic and left hip radiographs FINDINGS: Acute intertrochanteric proximal right femur fracture without significant displacement. Fixation hardware partially visualized in the proximal left femur. No pelvic diastasis. Surgical clips overlie bilateral inguinal regions and sacrum. No suspicious focal osseous lesions. Degenerative changes in the visualized lower lumbar spine. Mild bilateral hip osteoarthritis. No hip dislocation. IMPRESSION: 1. Acute intertrochanteric proximal right femur fracture without significant displacement. No right hip dislocation. 2. Mild bilateral hip osteoarthritis. Electronically Signed   By: Ilona Sorrel M.D.   On: 06/15/2019 19:56    Time Spent in minutes  35   Elesia Pemberton M.D on 06/19/2019 at 11:13 AM  Between 7am to 7pm - Pager - [phone]  After 7pm go to [email or web address] - password [facility]  [facility] -  Office  [phone]

## 2019-06-20 DIAGNOSIS — S72141A Displaced intertrochanteric fracture of right femur, initial encounter for closed fracture: Secondary | ICD-10-CM

## 2019-06-20 DIAGNOSIS — U071 COVID-19: Secondary | ICD-10-CM

## 2019-06-20 DIAGNOSIS — J1282 Pneumonia due to coronavirus disease 2019: Secondary | ICD-10-CM | POA: Diagnosis present

## 2019-06-20 DIAGNOSIS — W010XXA Fall on same level from slipping, tripping and stumbling without subsequent striking against object, initial encounter: Secondary | ICD-10-CM

## 2019-06-20 DIAGNOSIS — D638 Anemia in other chronic diseases classified elsewhere: Secondary | ICD-10-CM | POA: Diagnosis present

## 2019-06-20 DIAGNOSIS — S72341B Displaced spiral fracture of shaft of right femur, initial encounter for open fracture type I or II: Secondary | ICD-10-CM

## 2019-06-20 HISTORY — DX: COVID-19: U07.1

## 2019-06-20 HISTORY — DX: Displaced spiral fracture of shaft of right femur, initial encounter for open fracture type I or II: S72.341B

## 2019-06-20 LAB — COMPREHENSIVE METABOLIC PANEL
ALT: 14 U/L (ref 0–44)
AST: 18 U/L (ref 15–41)
Albumin: 2.6 g/dL — ABNORMAL LOW (ref 3.5–5.0)
Alkaline Phosphatase: 42 U/L (ref 38–126)
Anion gap: 5 (ref 5–15)
BUN: 30 mg/dL — ABNORMAL HIGH (ref 8–23)
CO2: 23 mmol/L (ref 22–32)
Calcium: 8.8 mg/dL — ABNORMAL LOW (ref 8.9–10.3)
Chloride: 106 mmol/L (ref 98–111)
Creatinine, Ser: 0.73 mg/dL (ref 0.61–1.24)
GFR calc Af Amer: >60 mL/min (ref >60)
GFR calc non Af Amer: >60 mL/min (ref >60)
Glucose, Bld: 111 mg/dL — ABNORMAL HIGH (ref 70–99)
Potassium: 4.2 mmol/L (ref 3.5–5.1)
Sodium: 134 mmol/L — ABNORMAL LOW (ref 135–145)
Total Bilirubin: 0.6 mg/dL (ref 0.3–1.2)
Total Protein: 4.6 g/dL — ABNORMAL LOW (ref 6.5–8.1)

## 2019-06-20 LAB — GLUCOSE, CAPILLARY
Glucose-Capillary: 105 mg/dL — ABNORMAL HIGH (ref 70–99)
Glucose-Capillary: 97 mg/dL (ref 70–99)
Glucose-Capillary: 135 mg/dL — ABNORMAL HIGH (ref 70–99)
Glucose-Capillary: 146 mg/dL — ABNORMAL HIGH (ref 70–99)
Glucose-Capillary: 144 mg/dL — ABNORMAL HIGH (ref 70–99)
Glucose-Capillary: 141 mg/dL — ABNORMAL HIGH (ref 70–99)

## 2019-06-20 LAB — RETICULOCYTES
Immature Retic Fract: 15.8 % (ref 2.3–15.9)
RBC.: 3.47 MIL/uL — ABNORMAL LOW (ref 4.22–5.81)
Retic Count, Absolute: 53.4 10*3/uL (ref 19.0–186.0)
Retic Ct Pct: 1.5 % (ref 0.4–3.1)

## 2019-06-20 LAB — IRON AND TIBC
Iron: 72 ug/dL (ref 45–182)
Saturation Ratios: 28 % (ref 17.9–39.5)
TIBC: 257 ug/dL (ref 250–450)
UIBC: 185 ug/dL

## 2019-06-20 LAB — LACTATE DEHYDROGENASE: LDH: 139 U/L (ref 98–192)

## 2019-06-20 LAB — C-REACTIVE PROTEIN: CRP: 1.4 mg/dL — ABNORMAL HIGH (ref <1.0)

## 2019-06-20 LAB — FERRITIN
Ferritin: 190 ng/mL (ref 24–336)
Ferritin: 212 ng/mL (ref 24–336)

## 2019-06-20 LAB — CBC
HCT: 28.2 % — ABNORMAL LOW (ref 39.0–52.0)
Hemoglobin: 9.2 g/dL — ABNORMAL LOW (ref 13.0–17.0)
MCH: 28.8 pg (ref 26.0–34.0)
MCHC: 32.6 g/dL (ref 30.0–36.0)
MCV: 88.4 fL (ref 80.0–100.0)
Platelets: 245 10*3/uL (ref 150–400)
RBC: 3.19 MIL/uL — ABNORMAL LOW (ref 4.22–5.81)
RDW: 13.5 % (ref 11.5–15.5)
WBC: 7.7 10*3/uL (ref 4.0–10.5)
nRBC: 0 % (ref 0.0–0.2)

## 2019-06-20 LAB — VITAMIN B12: Vitamin B-12: 1844 pg/mL — ABNORMAL HIGH (ref 180–914)

## 2019-06-20 LAB — SEDIMENTATION RATE: Sed Rate: 4 mm/hr (ref 0–16)

## 2019-06-20 LAB — FIBRINOGEN: Fibrinogen: 219 mg/dL (ref 210–475)

## 2019-06-20 LAB — FOLATE: Folate: 6.4 ng/mL (ref >5.9)

## 2019-06-20 LAB — D-DIMER, QUANTITATIVE: D-Dimer, Quant: 3.63 ug/mL-FEU — ABNORMAL HIGH (ref 0.00–0.50)

## 2019-06-20 MED ORDER — IPRATROPIUM-ALBUTEROL 20-100 MCG/ACT IN AERS
1.0000 | INHALATION_SPRAY | Freq: Four times a day (QID) | RESPIRATORY_TRACT | Status: DC
  Administered 2019-06-20 – 2019-06-24 (×14): 1 via RESPIRATORY_TRACT
  Filled 2019-06-20 (×2): qty 4

## 2019-06-20 NOTE — H&P (Signed)
History and Physical    Jesse [name] WYO:[id number] DOB: April 06, 1928 DOA: 06/15/2019    PCP: [name], MD  Patient coming from: [facility]   Chief Complaint: Fall  HPI: Jesse [name] is a 85 y.o. male with AAA, arthritis, history of prostate cancer, CAD status post CABG, chronic systolic congestive heart failure, GERD, hypertension, hyperlipidemia, RBBB presenting to the ED on 06/15/2019 with right hip pain after a mechanical fall.  He was trying to throw a rock when he lost his balance, fell and landed on his right hip.  Patient found to have acute intertrochanteric nondisplaced proximal right femur fracture.  Patient reported having some URI symptoms (sneezing) prior to admission but his vitals were stable and maintaining sats on room air.  Chest x-ray without acute findings.  He was tested positive for COVID-19.  He denies any fever, chills, night sweats.  No nausea, vomiting, diarrhea. Patient taken to the OR on 11/23 and underwent intramedullary fixation of the right femur.  Tolerated surgery well.  Review of Systems: As per HPI otherwise 10 point review of systems negative.    Past Medical History:  Diagnosis Date  . AAA (abdominal aortic aneurysm) ([place])   . Arthritis   . Bronchitis    hx of appro 40 years ago  . Cancer ([place]) 2010   prostate ( 40 Txs. of radiation treatments)  . Coronary artery disease   . Environmental allergies   . GERD (gastroesophageal reflux disease)   . HOH (hard of hearing)    wears bilateral hearing aids  . Hyperlipidemia   . Hypertension   . Kidney stone 1968  . Myocardial infarction ([place]) 1992  . Pneumonia    hx of  . RBBB     Past Surgical History:  Procedure Laterality Date  . ABDOMINAL AORTIC ANEURYSM REPAIR  08-13-1993   Dr Cameron Sprang  . CARDIAC CATHETERIZATION    . COLONOSCOPY W/ POLYPECTOMY    . CORONARY ARTERY BYPASS GRAFT  03-01-1991   Dr. Servando Snare  . ESOPHAGOGASTRODUODENOSCOPY (EGD) WITH PROPOFOL N/A 05/04/2018   Procedure:  ESOPHAGOGASTRODUODENOSCOPY (EGD) WITH PROPOFOL;  Surgeon: [name], MD;  Location: [facility];  Service: Gastroenterology;  Laterality: N/A;  . EYE SURGERY     Detached retina (Left eye); bilateral cataract removal  . FALSE ANEURYSM REPAIR  05/07/2012   Procedure: REPAIR FALSE ANEURYSM;  Surgeon: Rosetta Posner, MD;  Location: [facility] OR;  Service: Vascular;  Laterality: Left;  Repair of Left Femoral Artery Aneurysm  . HERNIA REPAIR    . HOT HEMOSTASIS N/A 05/04/2018   Procedure: HOT HEMOSTASIS (ARGON PLASMA COAGULATION/BICAP);  Surgeon: [name], MD;  Location: [facility];  Service: Gastroenterology;  Laterality: N/A;  . INTRAMEDULLARY (IM) NAIL INTERTROCHANTERIC Left 04/16/2019   Procedure: INTRAMEDULLARY (IM) NAIL INTERTROCHANTRIC;  Surgeon: Rod Can, MD;  Location: [facility];  Service: Orthopedics;  Laterality: Left;  . INTRAMEDULLARY (IM) NAIL INTERTROCHANTERIC Right 06/17/2019   Procedure: RIGHT INTRAMEDULLARY (IM) NAIL INTERTROCHANTRIC;  Surgeon: Rod Can, MD;  Location: [facility];  Service: Orthopedics;  Laterality: Right;  . ORIF PATELLA Right 06/09/2015   Procedure: OPEN REDUCTION INTERNAL (ORIF) FIXATION PATELLA;  Surgeon: Marybelle Killings, MD;  Location: [place];  Service: Orthopedics;  Laterality: Right;  . repair of bowel obstruction  1999   Dr. Dalbert Batman  . repair of ventral hernia  04-20-1994   P. Cameron Sprang MD  . resection femoral artery  03/19/2012   Dr. Curt Jews  . SUBMUCOSAL INJECTION  05/04/2018  Procedure: SUBMUCOSAL INJECTION;  Surgeon: [name], MD;  Location: [facility];  Service: Gastroenterology;;     reports that he quit smoking about 50 years ago. His smoking use included cigarettes. He quit after 20.00 years of use. He has never used smokeless tobacco. He reports that he does not drink alcohol or use drugs.  Allergies  Allergen Reactions  . Quinolones Other (See Comments)    unknown  . Adhesive [Tape] Itching and Rash  . Lactose Intolerance  (Gi) Other (See Comments)    Gas, bloating, stomach indigestion     Family History  Problem Relation Age of Onset  . Cancer Mother   . Cancer Father   . Varicose Veins Father   . Cancer Sister   . Diabetes Sister   . Heart attack Sister      Prior to Admission medications   Medication Sig Start Date End Date Taking? Authorizing Provider  acetaminophen (TYLENOL) 500 MG tablet Take 500 mg by mouth every 6 (six) hours as needed for mild pain or headache.   Yes [provider]  amoxicillin-clavulanate (AUGMENTIN) 875-125 MG tablet Take 1 tablet by mouth 2 (two) times daily.  06/13/19  Yes [provider]  aspirin 81 MG tablet Take 81 mg by mouth daily.   Yes [provider]  carboxymethylcellulose (REFRESH PLUS) 0.5 % SOLN Apply 1-2 drops to eye at bedtime.    Yes [provider]  carvedilol (COREG) 12.5 MG tablet TAKE 1 TABLET BY MOUTH 2 TIMES DAILY WITH A MEAL Patient taking differently: Take 12.5 mg by mouth 2 (two) times daily with a meal.  05/29/19  Yes [name], MD  Cyanocobalamin (B-12 PO) Take 1 tablet by mouth daily.   Yes [provider]  fenofibrate 160 MG tablet Take 1 tablet (160 mg total) by mouth daily. 05/29/19  Yes [name], MD  ferrous sulfate 325 (65 FE) MG tablet Take 325 mg by mouth daily with breakfast.   Yes [provider]  hydrocortisone 2.5 % cream Apply 1 application topically as needed (for dry ears).  08/16/18  Yes [provider]  loratadine (CLARITIN) 10 MG tablet Take 10 mg by mouth daily.    Yes [provider]  losartan (COZAAR) 50 MG tablet Take 50 mg by mouth daily.   Yes [provider]  pantoprazole (PROTONIX) 40 MG tablet Take 40 mg by mouth daily.   Yes [provider]  simvastatin (ZOCOR) 40 MG tablet Take 1 tablet (40 mg total) by mouth daily at 6 PM. 05/29/19  Yes [name], MD  sodium chloride (OCEAN) 0.65 % SOLN nasal spray Place 2 sprays  into both nostrils 2 (two) times daily.   Yes [provider]  docusate sodium (COLACE) 100 MG capsule Take 2 capsules (200 mg total) by mouth 2 (two) times daily. Patient not taking: Reported on 06/15/2019 04/20/19   Georgette Shell, MD  enoxaparin (LOVENOX) 40 MG/0.4ML injection Inject 0.4 mLs (40 mg total) into the skin daily. 06/19/19 07/19/19  Swinteck, Aaron Edelman, MD  HYDROcodone-acetaminophen (NORCO/VICODIN) 5-325 MG tablet Take 1 tablet by mouth every 4 (four) hours as needed for moderate pain. 06/18/19   Rod Can, MD    Physical Exam: Vitals:   06/19/19 1200 06/19/19 1600 06/19/19 2000 06/19/19 2219  BP: (!) 139/58 (!) 142/71  114/80  Pulse: 73 71 69 60  Resp: '20 18 15 16  '$ Temp: 98 F (36.7 C) 97.6 F (36.4 C)  97.8 F (36.6 C)  TempSrc: Oral Oral  Oral  SpO2: 95% 95% 95% 92%  Weight:      Height:        Constitutional: NAD, calm, comfortable Vitals:   06/19/19 1200 06/19/19 1600 06/19/19 2000 06/19/19 2219  BP: (!) 139/58 (!) 142/71  114/80  Pulse: 73 71 69 60  Resp: '20 18 15 16  '$ Temp: 98 F (36.7 C) 97.6 F (36.4 C)  97.8 F (36.6 C)  TempSrc: Oral Oral  Oral  SpO2: 95% 95% 95% 92%  Weight:      Height:       Eyes: PERRL, lids and conjunctivae normal ENMT: Mucous membranes are moist. Posterior pharynx clear of any exudate or lesions.Normal dentition.  Neck: normal, supple, no masses, no thyromegaly Respiratory: clear to auscultation bilaterally, no wheezing, no crackles. Normal respiratory effort. No accessory muscle use.  Cardiovascular: Regular rate and rhythm, no murmurs / rubs / gallops. No extremity edema. 2+ pedal pulses. No carotid bruits.  Abdomen: no tenderness, no masses palpated. No hepatosplenomegaly. Bowel sounds positive.  Musculoskeletal: no clubbing / cyanosis. No joint deformity upper and lower extremities. Good ROM, no contractures. Normal muscle tone.  Skin: no rashes, lesions, ulcers. No induration Neurologic: CN 2-12 grossly  intact. Sensation intact, DTR normal. Strength 5/5 in all 4.  Psychiatric: Normal judgment and insight. Alert and oriented x 3. Normal mood.   Wt Readings from Last 10 Encounters:  06/19/19 84 kg  05/30/19 80.4 kg  04/16/19 80.3 kg  01/10/19 80.7 kg  11/21/18 81.6 kg  09/25/18 86 kg  05/21/18 84.3 kg  05/08/18 84.3 kg  11/30/17 83.9 kg  08/29/17 84.4 kg    Labs on Admission: I have personally reviewed following labs and imaging studies  CBC: Recent Labs  Lab 06/15/19 2046 06/16/19 0900 06/17/19 0211 06/18/19 0303 06/19/19 0325  WBC 6.5 4.6 4.4 10.5 7.9  NEUTROABS  --  3.5 3.9 9.5* 7.1  HGB 11.6* 11.7* 11.2* 9.9* 9.7*  HCT 36.8* 37.1* 35.8* 31.5* 30.2*  MCV 93.6 93.2 93.0 91.8 91.8  PLT 215 217 217 210 456   Basic Metabolic Panel: Recent Labs  Lab 06/15/19 2046 06/16/19 0900 06/17/19 0211 06/18/19 0303 06/19/19 0325  NA 136 130* 134* 132* 133*  K 3.5 3.5 4.2 4.0 4.4  CL 110 100 105 102 104  CO2 20* 21* 21* 23 23  GLUCOSE 94 93 180* 136* 129*  BUN 20 19 24* 30* 28*  CREATININE 0.79 0.84 0.79 0.81 0.72  CALCIUM 8.0* 8.2* 9.1 8.9 9.0  MG  --  1.8 1.8  --  2.0  PHOS  --  2.1* 2.6 2.6 1.8*   GFR: Estimated Creatinine Clearance: 68.7 mL/min (by C-G formula based on SCr of 0.72 mg/dL). Liver Function Tests: Recent Labs  Lab 06/15/19 2046 06/16/19 0900 06/17/19 0211 06/18/19 0303 06/19/19 0325  AST '20 23 20 19 15  '$ ALT '14 14 14 14 11  '$ ALKPHOS 54 56 50 45 40  BILITOT 0.7 1.2 0.6 0.6 0.9  PROT 5.6* 5.8* 5.6* 5.1* 4.9*  ALBUMIN 3.1* 3.4* 3.1* 2.6* 2.8*   No results for input(s): LIPASE, AMYLASE in the last 168 hours. No results for input(s): AMMONIA in the last 168 hours. Coagulation Profile: No results for input(s): INR, PROTIME in the last 168 hours. Cardiac Enzymes: No results for input(s): CKTOTAL, CKMB, CKMBINDEX, TROPONINI in the last 168 hours. BNP (last 3 results) No results for input(s): PROBNP in the last 8760 hours. HbA1C: Recent Labs  06/18/19 0330  HGBA1C 5.2   CBG: Recent Labs  Lab 06/18/19 2343 06/19/19 0333 06/19/19 0937 06/19/19 1314 06/19/19 1705  GLUCAP 139* 105* 97 130* 139*   Lipid Profile: No results for input(s): CHOL, HDL, LDLCALC, TRIG, CHOLHDL, LDLDIRECT in the last 72 hours. Thyroid Function Tests: No results for input(s): TSH, T4TOTAL, FREET4, T3FREE, THYROIDAB in the last 72 hours. Anemia Panel: Recent Labs    06/18/19 0854 06/19/19 0325  FERRITIN 208 184   Urine analysis:    Component Value Date/Time   COLORURINE YELLOW 05/04/2018 2140   APPEARANCEUR CLEAR 05/04/2018 2140   LABSPEC 1.028 05/04/2018 2140   PHURINE 6.0 05/04/2018 2140   GLUCOSEU NEGATIVE 05/04/2018 2140   [place] NEGATIVE 05/04/2018 2140   BILIRUBINUR NEGATIVE 05/04/2018 2140   [place] NEGATIVE 05/04/2018 2140   PROTEINUR NEGATIVE 05/04/2018 2140   UROBILINOGEN 2.0 (H) 05/28/2015 0029   NITRITE NEGATIVE 05/04/2018 2140   LEUKOCYTESUR NEGATIVE 05/04/2018 2140    Radiological Exams on Admission: No results found.  EKG: Independently reviewed.   Assessment/Plan Principal Problem:   COVID-19 virus infection Active Problems:   Hip fracture ([facility])   HLD (hyperlipidemia)   Essential hypertension   CAD, ARTERY BYPASS GRAFT   Chronic systolic heart failure ([place])  85 year old Caucasian man with AAA, arthritis, history of prostate cancer, CAD status post CABG, chronic systolic congestive heart failure, GERD, hypertension, hyperlipidemia, RBBB who sustained a right hip fracture from mechanical fall, s/p ORIF on 06/17/2019, awaiting placement for mild asymptomatic SARS-CoV-2 infection.   Principal Problem: Right intertrochanteric femur fracture, nondisplaced, Underwent IM fixation of the right femur on 11/23.  Tolerated well.  Orthopedic consult appreciated.  Pain control as needed (Norco and Robaxin).  Bowel regimen.  Up with therapy.  Worked with PT yesterday and recommends SNF. DVT prophylaxis with subcu Lovenox.   COVID-19 infection Patient does not have any respiratory symptoms throughout his hospital stay so far.  He had elevated ESR, D-dimer and CRP which are trending down well.  Fibrinogen was normal. He was started on remdesivir given elevated inflammatory markers on 11/23 (day 3/5).  Also on Decadron 6 mg daily. Transferred to [facility] while awaiting placement.  Active problems CAD with history of CABG Stable.  Continue Coreg, losartan and statin.  Essential hypertension Stable on home meds  GERD Continue PPI  Normocytic/postoperative blood loss anemia Stable.  Hyperglycemia Mild secondary to steroid use.  Monitor for now.    Code Status : DNR  Family Communication  : None at bedside  Disposition Plan  : Needs SNF after completion of remdesivir on 11/27  Barriers For Discharge : Active symptoms  Consults  : Orthopedics (Dr. Lyla Glassing)  Procedures  : IM fixation right hip  DVT Prophylaxis  : Subcu Lovenox   Peyton Bottoms MD [facility] Pager [phone]  If 7PM-7AM, please contact night-coverage [email or web address] Password [email or web address]  06/20/2019, 12:21 AM

## 2019-06-20 NOTE — Progress Notes (Signed)
PROGRESS NOTE    Jesse [name]  [id number] DOB: December 23, 1927 DOA: 06/15/2019 PCP: [name], MD   Brief Narrative:  85 y.o. WM PMHx Arthritis, Hx prostate cancer, AAA, CAD status post CABG, chronic systolic CHF, essential HTN, HLD, RBBB, GERD,   Presenting to the ED for evaluation of right hip pain after a fall.  Patient states he was walking in his driveway and stepped on a stone.  He then bent down to pick up the stone and as he was trying to throw it away he lost his balance and fell on his right hip.  Since then he is having pain in this area.  He did not hit his head or lose consciousness.  He was not having any lightheadedness/dizziness, chest pain, or shortness of breath prior to the fall.  States he has had problems with his balance for the past few years and 2 months ago fell and fractured his left hip.   Subjective: A/O x4, negative CP, negative abdominal pain, negative N/V, positive feeling of S OB (SPO2= 97%)   Assessment & Plan:   Principal Problem:   COVID-19 virus infection Active Problems:   HLD (hyperlipidemia)   Essential hypertension   CAD, ARTERY BYPASS GRAFT   Coronary artery disease involving native coronary artery of native heart without angina pectoris   Chronic systolic heart failure (HCC)   Hip fracture (HCC)   Anemia due to chronic illness   Pneumonia due to COVID-19 virus   Displaced spiral fracture of shaft of right femur, initial encounter for open fracture type I or II ([place])   Right intertrochanteric femur fracture, nondisplaced, -S/p RIGHT femur  IM fixation  -Pain control as needed (Norco and Robaxin).  Bowel regimen.  Up with therapy.   -11/26 PT/OT; RIGHT femur fracture consult  CIR vs SNF      Covid pneumonia COVID-19 Labs  Recent Labs    06/18/19 0303  06/18/19 0854 06/19/19 0325 06/20/19 0430 06/20/19 0900  DDIMER 3.87*  --   --  3.01* 3.63*  --   FERRITIN  --    < > 208 184 190 212  LDH  --   --  132 122 139  --   CRP  --    --  3.9* 2.9* 1.4*  --    < > = values in this interval not displayed.    Lab Results  Component Value Date   SARSCOV2NAA POSITIVE (A) 06/15/2019   [place] NEGATIVE 04/19/2019   [place] NEGATIVE 04/15/2019  -Decadron 6 mg daily -Remdesivir per pharmacy protocol -Combivent -Flutter valve -Incentive spirometry -Titrate O2 to maintain SPO2> 88% -Vitamins per Covid protocol   Chronic systolic CHF? -Strict in and out -Daily weight  CAD/Hx CABG -Coreg 12.5 mg BID -Losartan 50 mg daily  Essential hypertension -See CAD  HLD -Fenofibrate 160 mg daily  GERD Continue PPI  Normocytic/postoperative blood loss anemia? Recent Labs  Lab 06/16/19 0900 06/17/19 0211 06/18/19 0303 06/19/19 0325 06/20/19 0430  HGB 11.7* 11.2* 9.9* 9.7* 9.2*  -Slightly trending down -Anemia panel pending -Fecal occult pending  Hyperglycemia -11/24 hemoglobin A1c = 5.2  -Sensitive SSI    DVT prophylaxis: Lovenox Code Status: DNR Family Communication:  Disposition Plan: TBD   Consultants:  Orthopedic surgery     Procedures/Significant Events:  S/p RIGHT femur  IM fixation    I have personally reviewed and interpreted all radiology studies and my findings are as above.  VENTILATOR SETTINGS:    Cultures   Antimicrobials:  Anti-infectives (From admission, onward)   Start     Stop   06/18/19 1000  remdesivir 100 mg in sodium chloride 0.9 % 250 mL IVPB     06/22/19 0959   06/17/19 1600  ceFAZolin (ANCEF) IVPB 2g/100 mL premix     06/17/19 2203   06/17/19 1142  ceFAZolin (ANCEF) 2-4 GM/100ML-% IVPB    Note to Pharmacy: [place]  : cabinet override   06/17/19 1528   06/17/19 1000  remdesivir 200 mg in sodium chloride 0.9 % 250 mL IVPB     06/17/19 0911   06/16/19 0600  ceFAZolin (ANCEF) IVPB 2g/100 mL premix  Status:  Discontinued     06/17/19 0559   06/16/19 0600  ceFAZolin (ANCEF) IVPB 2g/100 mL premix  Status:  Discontinued     06/15/19 2308        Devices    LINES / TUBES:      Continuous Infusions: . methocarbamol (ROBAXIN) IV Stopped (06/16/19 0215)  . remdesivir 100 mg in NS 250 mL 100 mg (06/20/19 1011)     Objective: Vitals:   06/20/19 1115 06/20/19 1130 06/20/19 1145 06/20/19 1200  BP:      Pulse: 67 67 60 63  Resp:      Temp:      TempSrc:      SpO2: 97% 97% 96% 96%  Weight:      Height:        Intake/Output Summary (Last 24 hours) at 06/20/2019 1644 Last data filed at 06/20/2019 1352 Gross per 24 hour  Intake 210 ml  Output 1425 ml  Net -1215 ml   Filed Weights   06/17/19 0432 06/18/19 0500 06/19/19 0335  Weight: 85.6 kg 83.5 kg 84 kg    Examination:  General: A/O x4, no acute respiratory distress Eyes: negative scleral hemorrhage, negative anisocoria, negative icterus ENT: Negative Runny nose, negative gingival bleeding, Neck:  Negative scars, masses, torticollis, lymphadenopathy, JVD Lungs: Clear to auscultation bilaterally without wheezes or crackles Cardiovascular: Regular rate and rhythm without murmur gallop or rub normal S1 and S2 Abdomen: negative abdominal pain, nondistended, positive soft, bowel sounds, no rebound, no ascites, no appreciable mass Extremities: No significant cyanosis, clubbing, or edema bilateral lower extremities RIGHT thigh mild swelling appropriate for recent surgery surgical dressing in place, negative warmth, negative sign of infection Skin: Negative rashes, lesions, ulcers Psychiatric:  Negative depression, negative anxiety, negative fatigue, negative mania  Central nervous system:  Cranial nerves II through XII intact, tongue/uvula midline, all extremities muscle strength 5/5, sensation intact throughout,  negative dysarthria, negative expressive aphasia, negative receptive aphasia.  .     Data Reviewed: Care during the described time interval was provided by me .  I have reviewed this patient's available data, including medical history, events of note,  physical examination, and all test results as part of my evaluation.   CBC: Recent Labs  Lab 06/16/19 0900 06/17/19 0211 06/18/19 0303 06/19/19 0325 06/20/19 0430  WBC 4.6 4.4 10.5 7.9 7.7  NEUTROABS 3.5 3.9 9.5* 7.1  --   HGB 11.7* 11.2* 9.9* 9.7* 9.2*  HCT 37.1* 35.8* 31.5* 30.2* 28.2*  MCV 93.2 93.0 91.8 91.8 88.4  PLT 217 217 210 208 [id number]   Basic Metabolic Panel: Recent Labs  Lab 06/16/19 0900 06/17/19 0211 06/18/19 0303 06/19/19 0325 06/20/19 0430  NA 130* 134* 132* 133* 134*  K 3.5 4.2 4.0 4.4 4.2  CL 100 105 102 104 106  CO2 21* 21* 23 23 23  GLUCOSE 93 180* 136* 129* 111*  BUN 19 24* 30* 28* 30*  CREATININE 0.84 0.79 0.81 0.72 0.73  CALCIUM 8.2* 9.1 8.9 9.0 8.8*  MG 1.8 1.8  --  2.0  --   PHOS 2.1* 2.6 2.6 1.8*  --    GFR: Estimated Creatinine Clearance: 68.7 mL/min (by C-G formula based on SCr of 0.73 mg/dL). Liver Function Tests: Recent Labs  Lab 06/16/19 0900 06/17/19 0211 06/18/19 0303 06/19/19 0325 06/20/19 0430  AST 23 20 19 15 18   ALT 14 14 14 11 14   ALKPHOS 56 50 45 40 42  BILITOT 1.2 0.6 0.6 0.9 0.6  PROT 5.8* 5.6* 5.1* 4.9* 4.6*  ALBUMIN 3.4* 3.1* 2.6* 2.8* 2.6*   No results for input(s): LIPASE, AMYLASE in the last 168 hours. No results for input(s): AMMONIA in the last 168 hours. Coagulation Profile: No results for input(s): INR, PROTIME in the last 168 hours. Cardiac Enzymes: No results for input(s): CKTOTAL, CKMB, CKMBINDEX, TROPONINI in the last 168 hours. BNP (last 3 results) No results for input(s): PROBNP in the last 8760 hours. HbA1C: Recent Labs    06/18/19 0330  HGBA1C 5.2   CBG: Recent Labs  Lab 06/19/19 1314 06/19/19 1705 06/20/19 0454 06/20/19 0717 06/20/19 1243  GLUCAP 130* 139* 105* 97 135*   Lipid Profile: No results for input(s): CHOL, HDL, LDLCALC, TRIG, CHOLHDL, LDLDIRECT in the last 72 hours. Thyroid Function Tests: No results for input(s): TSH, T4TOTAL, FREET4, T3FREE, THYROIDAB in the last 72  hours. Anemia Panel: Recent Labs    06/20/19 0430 06/20/19 0900  VITAMINB12  --  1,844*  FOLATE  --  6.4  FERRITIN 190 212  TIBC  --  257  IRON  --  72  RETICCTPCT  --  1.5   Urine analysis:    Component Value Date/Time   COLORURINE YELLOW 05/04/2018 2140   APPEARANCEUR CLEAR 05/04/2018 2140   LABSPEC 1.028 05/04/2018 2140   PHURINE 6.0 05/04/2018 2140   GLUCOSEU NEGATIVE 05/04/2018 2140   HGBUR NEGATIVE 05/04/2018 2140   BILIRUBINUR NEGATIVE 05/04/2018 2140   KETONESUR NEGATIVE 05/04/2018 2140   PROTEINUR NEGATIVE 05/04/2018 2140   UROBILINOGEN 2.0 (H) 05/28/2015 0029   NITRITE NEGATIVE 05/04/2018 2140   LEUKOCYTESUR NEGATIVE 05/04/2018 2140   Sepsis Labs: @LABRCNTIP (procalcitonin:4,lacticidven:4)  ) Recent Results (from the past 240 hour(s))  SARS CORONAVIRUS 2 (TAT 6-24 HRS) Nasopharyngeal Nasopharyngeal Swab     Status: Abnormal   Collection Time: 06/15/19  8:46 PM   Specimen: Nasopharyngeal Swab  Result Value Ref Range Status   SARS Coronavirus 2 POSITIVE (A) NEGATIVE Final    Comment: RESULT CALLED TO, READ BACK BY AND VERIFIED WITH: Alanson Aly, RN AT [phone] ON 06/16/2019 BY SAINVILUS S (NOTE) SARS-CoV-2 target nucleic acids are DETECTED. The SARS-CoV-2 RNA is generally detectable in upper and lower respiratory specimens during the acute phase of infection. Positive results are indicative of active infection with SARS-CoV-2. Clinical  correlation with patient history and other diagnostic information is necessary to determine patient infection status. Positive results do  not rule out bacterial infection or co-infection with other viruses. The expected result is Negative. Fact Sheet for Patients: SugarRoll.be Fact Sheet for Healthcare Providers: https://www.-mathews.com/ This test is not yet approved or cleared by the [place] FDA and  has been authorized for detection and/or diagnosis of SARS-CoV-2 by FDA  under an Emergency Use Authorization (EUA). This EUA will remain  in effect (meaning this test  can be used) for the duration  of the COVID-19 declaration under Section 564(b)(1) of the Act, 21 U.S.C. section 360bbb-3(b)(1), unless the authorization is terminated or revoked sooner. Performed at [facility], [place] [address] [place], [place] [address]   Surgical pcr screen     Status: None   Collection Time: 06/16/19  1:35 AM   Specimen: Nasal Mucosa; Nasal Swab  Result Value Ref Range Status   MRSA, PCR NEGATIVE NEGATIVE Final   Staphylococcus aureus NEGATIVE NEGATIVE Final    Comment: (NOTE) The Xpert SA Assay (FDA approved for NASAL specimens in patients 57 years of age and older), is one component of a comprehensive surveillance program. It is not intended to diagnose infection nor to guide or monitor treatment. Performed at [facility], [place] [address] [place], [place] [address]          Radiology Studies: No results found.      Scheduled Meds: . carvedilol  12.5 mg Oral BID WC  . Chlorhexidine Gluconate Cloth  6 each Topical Daily  . dexamethasone (DECADRON) injection  6 mg Intravenous Q24H  . docusate sodium  100 mg Oral BID  . enoxaparin (LOVENOX) injection  40 mg Subcutaneous Q24H  . fenofibrate  160 mg Oral Daily  . ferrous sulfate  325 mg Oral Q breakfast  . insulin aspart  0-9 Units Subcutaneous Q4H  . Ipratropium-Albuterol  1 puff Inhalation QID  . loratadine  10 mg Oral Daily  . losartan  50 mg Oral Daily  . mouth rinse  15 mL Mouth Rinse BID  . pantoprazole  40 mg Oral Daily  . polyvinyl alcohol  1-2 drop Both Eyes QHS  . simvastatin  40 mg Oral q1800  . sodium chloride  2 spray Each Nare BID  . sodium chloride flush  10-40 mL Intracatheter Q12H  . vitamin C  500 mg Oral Daily  . zinc sulfate  220 mg Oral Daily   Continuous Infusions: . methocarbamol (ROBAXIN) IV Stopped (06/16/19 0215)  . remdesivir 100 mg in NS 250 mL  100 mg (06/20/19 1011)     LOS: 5 days   The patient is critically ill with multiple organ systems failure and requires high complexity decision making for assessment and support, frequent evaluation and titration of therapies, application of advanced monitoring technologies and extensive interpretation of multiple databases. Critical Care Time devoted to patient care services described in this note  Time spent: 40 minutes     Ahrianna Siglin, Geraldo Docker, MD [facility] Pager [phone]  If 7PM-7AM, please contact night-coverage [email or web address] Password [facility] 06/20/2019, 4:44 PM

## 2019-06-21 DIAGNOSIS — F419 Anxiety disorder, unspecified: Secondary | ICD-10-CM | POA: Diagnosis present

## 2019-06-21 LAB — CBC WITH DIFFERENTIAL/PLATELET
Abs Immature Granulocytes: 0.28 10*3/uL — ABNORMAL HIGH (ref 0.00–0.07)
Basophils Absolute: 0 10*3/uL (ref 0.0–0.1)
Basophils Relative: 0 %
Eosinophils Absolute: 0 10*3/uL (ref 0.0–0.5)
Eosinophils Relative: 0 %
HCT: 29.4 % — ABNORMAL LOW (ref 39.0–52.0)
Hemoglobin: 9.5 g/dL — ABNORMAL LOW (ref 13.0–17.0)
Immature Granulocytes: 3 %
Lymphocytes Relative: 6 %
Lymphs Abs: 0.6 10*3/uL — ABNORMAL LOW (ref 0.7–4.0)
MCH: 28.9 pg (ref 26.0–34.0)
MCHC: 32.3 g/dL (ref 30.0–36.0)
MCV: 89.4 fL (ref 80.0–100.0)
Monocytes Absolute: 0.9 10*3/uL (ref 0.1–1.0)
Monocytes Relative: 9 %
Neutro Abs: 8.3 10*3/uL — ABNORMAL HIGH (ref 1.7–7.7)
Neutrophils Relative %: 82 %
Platelets: 269 10*3/uL (ref 150–400)
RBC: 3.29 MIL/uL — ABNORMAL LOW (ref 4.22–5.81)
RDW: 13.5 % (ref 11.5–15.5)
WBC: 10 10*3/uL (ref 4.0–10.5)
nRBC: 0 % (ref 0.0–0.2)

## 2019-06-21 LAB — MAGNESIUM: Magnesium: 1.8 mg/dL (ref 1.7–2.4)

## 2019-06-21 LAB — COMPREHENSIVE METABOLIC PANEL
ALT: 17 U/L (ref 0–44)
AST: 21 U/L (ref 15–41)
Albumin: 2.6 g/dL — ABNORMAL LOW (ref 3.5–5.0)
Alkaline Phosphatase: 45 U/L (ref 38–126)
Anion gap: 6 (ref 5–15)
BUN: 29 mg/dL — ABNORMAL HIGH (ref 8–23)
CO2: 23 mmol/L (ref 22–32)
Calcium: 8.9 mg/dL (ref 8.9–10.3)
Chloride: 104 mmol/L (ref 98–111)
Creatinine, Ser: 0.7 mg/dL (ref 0.61–1.24)
GFR calc Af Amer: >60 mL/min (ref >60)
GFR calc non Af Amer: >60 mL/min (ref >60)
Glucose, Bld: 128 mg/dL — ABNORMAL HIGH (ref 70–99)
Potassium: 4.5 mmol/L (ref 3.5–5.1)
Sodium: 133 mmol/L — ABNORMAL LOW (ref 135–145)
Total Bilirubin: 0.6 mg/dL (ref 0.3–1.2)
Total Protein: 4.6 g/dL — ABNORMAL LOW (ref 6.5–8.1)

## 2019-06-21 LAB — LACTATE DEHYDROGENASE: LDH: 158 U/L (ref 98–192)

## 2019-06-21 LAB — PHOSPHORUS: Phosphorus: 2.1 mg/dL — ABNORMAL LOW (ref 2.5–4.6)

## 2019-06-21 LAB — GLUCOSE, CAPILLARY
Glucose-Capillary: 120 mg/dL — ABNORMAL HIGH (ref 70–99)
Glucose-Capillary: 96 mg/dL (ref 70–99)
Glucose-Capillary: 115 mg/dL — ABNORMAL HIGH (ref 70–99)
Glucose-Capillary: 142 mg/dL — ABNORMAL HIGH (ref 70–99)
Glucose-Capillary: 138 mg/dL — ABNORMAL HIGH (ref 70–99)

## 2019-06-21 LAB — C-REACTIVE PROTEIN: CRP: 0.9 mg/dL (ref <1.0)

## 2019-06-21 LAB — FERRITIN: Ferritin: 189 ng/mL (ref 24–336)

## 2019-06-21 LAB — D-DIMER, QUANTITATIVE: D-Dimer, Quant: 5.96 ug/mL-FEU — ABNORMAL HIGH (ref 0.00–0.50)

## 2019-06-21 MED ORDER — ENOXAPARIN SODIUM 40 MG/0.4ML ~~LOC~~ SOLN
40.0000 mg | Freq: Two times a day (BID) | SUBCUTANEOUS | Status: DC
  Administered 2019-06-21 – 2019-06-24 (×6): 40 mg via SUBCUTANEOUS
  Filled 2019-06-21 (×6): qty 0.4

## 2019-06-21 MED ORDER — SODIUM CHLORIDE 0.9 % IV BOLUS
500.0000 mL | Freq: Once | INTRAVENOUS | Status: AC
  Administered 2019-06-21: 500 mL via INTRAVENOUS

## 2019-06-21 MED ORDER — ALBUMIN HUMAN 25 % IV SOLN
50.0000 g | Freq: Once | INTRAVENOUS | Status: AC
  Administered 2019-06-21: 50 g via INTRAVENOUS
  Filled 2019-06-21: qty 50

## 2019-06-21 MED ORDER — QUETIAPINE FUMARATE 25 MG PO TABS
25.0000 mg | ORAL_TABLET | Freq: Every day | ORAL | Status: DC
  Administered 2019-06-21 – 2019-06-23 (×3): 25 mg via ORAL
  Filled 2019-06-21 (×3): qty 1

## 2019-06-21 NOTE — Progress Notes (Addendum)
Physical Therapy Treatment Patient Details Name: Jesse [name] MRN: [id number] DOB: [birth year] Today's Date: 06/21/2019    History of Present Illness Pt is 85 yo male admitted s/p fall iwth R intertrochanteric femur fraction and s/p intramedullary fixation on 06/17/19 with WBAT status.  Additionally, pt found to be COVID 19 positive.  Pt with  medical history significant of AAA, arthritis, history of prostate cancer, CAD status post CABG, chronic systolic congestive heart failure, GERD, hypertension, hyperlipidemia, RBBB.  Additionally pt with L IM nail 04/16/19 and ORIF patella 05/2015.    PT Comments    Pt anxious about moving due to R LE pain.  We did warm-up exercises in bed before getting up.  He was limited to OOB to recliner chair today due to lightheadedness in standing (BPs 70s/50s once reclined).  Pt remains appropriate for post acute rehab at discharge.  O2 sats stable on RA throughout.  Issued hip fx HEP: [id number] ([employer] access code).  Follow Up Recommendations  SNF     Equipment Recommendations  None recommended by PT    Recommendations for Other Services   NA     Precautions / Restrictions Precautions Precautions: Fall Precaution Comments: h/o falls Restrictions RLE Weight Bearing: Weight bearing as tolerated    Mobility  Bed Mobility Overal bed mobility: Needs Assistance Bed Mobility: Supine to Sit     Supine to sit: Min assist;HOB elevated     General bed mobility comments: Min assist to help progress R leg over EOB, support trunk as he scooted out.  HOB raised ~30 degrees.   Transfers Overall transfer level: Needs assistance Equipment used: Rolling walker (2 wheeled) Transfers: Sit to/from [facility] Sit to Stand: Min assist;+2 physical assistance Stand pivot transfers: Min assist;+2 physical assistance       General transfer comment: Min assist to support trunk during transition, stabilize RW and stabilize pt for balance on  standing.  Pt reporting lightheadedness in standing. Two person physical assist to ensure pt made it around to the recliner chair on his right, 6-7 pivotal steps and pt reporting "i'm going down" while backing up to the chair.  BPs once seated 70s/50s, RN made aware.   Ambulation/Gait Ambulation/Gait assistance: Min assist Gait Distance (Feet): 3 Feet Assistive device: Rolling walker (2 wheeled) Gait Pattern/deviations: Step-to pattern;Decreased stride length;Decreased weight shift to right Gait velocity: decreased   General Gait Details: 6-7 pivotal steps bed to recliner chair with RW.          Balance Overall balance assessment: Needs assistance Sitting-balance support: Feet supported;Bilateral upper extremity supported Sitting balance-Leahy Scale: Good     Standing balance support: Bilateral upper extremity supported Standing balance-Leahy Scale: Poor Standing balance comment: needs external support from RW and therapist.                            Cognition Arousal/Alertness: Awake/alert Behavior During Therapy: Anxious Overall Cognitive Status: No family/caregiver present to determine baseline cognitive functioning                                 General Comments: Pt is likely close to baseline, talks about how difficult it is to go through this again (i.e. breaking a leg) and how mentally defeating it is and how he sometimes wants to "give up".       Exercises Total Joint Exercises Ankle Circles/Pumps: AROM;Both;10 reps [employer]  Sets: AROM;Right;10 reps Short Arc Quad: AROM;Right;10 reps Heel Slides: AAROM;Right;10 reps Hip ABduction/ADduction: AAROM;Right;10 reps        Pertinent Vitals/Pain Pain Assessment: Faces Faces Pain Scale: Hurts whole lot Pain Location: R hip Pain Descriptors / Indicators: Aching;Discomfort;Grimacing Pain Intervention(s): Limited activity within patient's tolerance;Monitored during session;Repositioned;Ice applied            PT Goals (current goals can now be found in the care plan section) Acute Rehab PT Goals Patient Stated Goal: to get better adn be independent again Progress towards PT goals: Progressing toward goals    Frequency    Min 3X/week      PT Plan Current plan remains appropriate    Co-evaluation PT/OT/SLP Co-Evaluation/Treatment: Yes Reason for Co-Treatment: Complexity of the patient's impairments (multi-system involvement);Necessary to address cognition/behavior during functional activity;For patient/therapist safety;To address functional/ADL transfers PT goals addressed during session: Mobility/safety with mobility;Balance;Proper use of DME;Strengthening/ROM        AM-PAC PT "6 Clicks" Mobility   Outcome Measure  Help needed turning from your back to your side while in a flat bed without using bedrails?: A Little Help needed moving from lying on your back to sitting on the side of a flat bed without using bedrails?: A Little Help needed moving to and from a bed to a chair (including a wheelchair)?: A Little Help needed standing up from a chair using your arms (e.g., wheelchair or bedside chair)?: A Little Help needed to walk in hospital room?: A Little Help needed climbing 3-5 steps with a railing? : Total 6 Click Score: 16    End of Session Equipment Utilized During Treatment: Gait belt Activity Tolerance: Patient limited by pain;Other (comment)(by lightheadedness) Patient left: in chair;with call bell/phone within reach;with chair alarm set Nurse Communication: Mobility status;Other (comment)(low BP) PT Visit Diagnosis: Other abnormalities of gait and mobility (R26.89);Repeated falls (R29.6);Muscle weakness (generalized) (M62.81)     Time: [id number] PT Time Calculation (min) (ACUTE ONLY): 32 min  Charges:  $Therapeutic Activity: 8-22 mins             Verdene Lennert, PT, DPT  Acute Rehabilitation [phone] pager #[phone] office  @ [facility]: [phone]            06/21/2019, 1:58 PM

## 2019-06-21 NOTE — Progress Notes (Signed)
Spoke with [name] and answered all questions at this time

## 2019-06-21 NOTE — Progress Notes (Addendum)
PROGRESS NOTE    Jesse [name]  [id number] DOB: 1927/12/09 DOA: 06/15/2019 PCP: [name], MD   Brief Narrative:  85 y.o. WM PMHx Arthritis, Hx prostate cancer, AAA, CAD status post CABG, chronic systolic CHF, essential HTN, HLD, RBBB, GERD,   Presenting to the ED for evaluation of right hip pain after a fall.  Patient states he was walking in his driveway and stepped on a stone.  He then bent down to pick up the stone and as he was trying to throw it away he lost his balance and fell on his right hip.  Since then he is having pain in this area.  He did not hit his head or lose consciousness.  He was not having any lightheadedness/dizziness, chest pain, or shortness of breath prior to the fall.  States he has had problems with his balance for the past few years and 2 months ago fell and fractured his left hip.   Subjective: A/O x4, negative CP, negative abdominal pain, negative N/V, positive feeling of S OB (SPO2= 97%)   Assessment & Plan:   Principal Problem:   COVID-19 virus infection Active Problems:   HLD (hyperlipidemia)   Essential hypertension   CAD, ARTERY BYPASS GRAFT   Coronary artery disease involving native coronary artery of native heart without angina pectoris   Chronic systolic heart failure (HCC)   Hip fracture (HCC)   Anemia due to chronic illness   Pneumonia due to COVID-19 virus   Displaced spiral fracture of shaft of right femur, initial encounter for open fracture type I or II (HCC)   Anxiety   Right intertrochanteric femur fracture, nondisplaced, -S/p RIGHT femur  IM fixation  -Pain control as needed (Norco and Robaxin).  Bowel regimen.  Up with therapy.   -11/26 PT/OT; RIGHT femur fracture consult recommend SNF      Covid pneumonia COVID-19 Labs  Recent Labs    06/19/19 0325 06/20/19 0430 06/20/19 0900 06/21/19 0228  DDIMER 3.01* 3.63*  --  5.96*  FERRITIN 184 190 212 189  LDH 122 139  --  158  CRP 2.9* 1.4*  --  0.9    Lab Results   Component Value Date   SARSCOV2NAA POSITIVE (A) 06/15/2019   SARSCOV2NAA NEGATIVE 04/19/2019   [place] NEGATIVE 04/15/2019  -Decadron 6 mg daily -Remdesivir per pharmacy protocol -Combivent -Flutter valve -Incentive spirometry -Titrate O2 to maintain SPO2> 88% -Vitamins per Covid protocol   Chronic Systolic and Diastolic CHF -[id number] echocardiogram; EF 40 to AB-[id number], grade 1 diastolic dysfunction -Strict in and out -1.0 L -Daily weight Filed Weights   06/18/19 0500 06/19/19 0335 06/21/19 0600  Weight: 83.5 kg 84 kg 87.9 kg    CAD/Hx CABG -Coreg 12.5 mg BID -Losartan 50 mg daily  Essential Hypertension/Hypotension -See CAD -11/27 patient hypotensive, Albumin 50 g + bolus normal saline 561ml  HLD -Fenofibrate 160 mg daily  GERD Continue PPI  Normocytic/postoperative blood loss anemia? Recent Labs  Lab 06/17/19 0211 06/18/19 0303 06/19/19 0325 06/20/19 0430 06/21/19 0228  HGB 11.2* 9.9* 9.7* 9.2* 9.5*  -Slightly trending down -Anemia panel; consistent with normocytic anemia -Fecal occult pending  Hyperglycemia -11/24 hemoglobin A1c = 5.2  -Sensitive SSI  Anxiety -Seroquel 25 mg qhs    DVT prophylaxis: Lovenox Code Status: DNR Family Communication: 11/27 left message for [name] (son) that had attempted to contact him to update him concerning fathers plan of care. Disposition Plan: TBD   Consultants:  Orthopedic surgery     Procedures/Significant Events:  S/p RIGHT femur  IM fixation    I have personally reviewed and interpreted all radiology studies and my findings are as above.  VENTILATOR SETTINGS:    Cultures   Antimicrobials: Anti-infectives (From admission, onward)   Start     Stop   06/18/19 1000  remdesivir 100 mg in sodium chloride 0.9 % 250 mL IVPB     06/22/19 0959   06/17/19 1600  ceFAZolin (ANCEF) IVPB 2g/100 mL premix     06/17/19 2203   06/17/19 1142  ceFAZolin (ANCEF) 2-4 GM/100ML-% IVPB    Note to Pharmacy:  [place]  : cabinet override   06/17/19 1528   06/17/19 1000  remdesivir 200 mg in sodium chloride 0.9 % 250 mL IVPB     06/17/19 0911   06/16/19 0600  ceFAZolin (ANCEF) IVPB 2g/100 mL premix  Status:  Discontinued     06/17/19 0559   06/16/19 0600  ceFAZolin (ANCEF) IVPB 2g/100 mL premix  Status:  Discontinued     06/15/19 2308       Devices    LINES / TUBES:      Continuous Infusions: . methocarbamol (ROBAXIN) IV Stopped (06/16/19 0215)     Objective: Vitals:   06/21/19 0725 06/21/19 1029 06/21/19 1031 06/21/19 1405  BP: (!) 141/88 (!) 73/56 (!) 83/64 105/66  Pulse: 60     Resp: 18     Temp: 97.8 F (36.6 C)     TempSrc: Oral     SpO2: 97%     Weight:      Height:        Intake/Output Summary (Last 24 hours) at 06/21/2019 1459 Last data filed at 06/21/2019 0941 Gross per 24 hour  Intake 240 ml  Output 300 ml  Net -60 ml   Filed Weights   06/18/19 0500 06/19/19 0335 06/21/19 0600  Weight: 83.5 kg 84 kg 87.9 kg   Physical Exam:  General: A/O x4, no acute respiratory distress Eyes: negative scleral hemorrhage, negative anisocoria, negative icterus ENT: Negative Runny nose, negative gingival bleeding, Neck:  Negative scars, masses, torticollis, lymphadenopathy, JVD Lungs: Clear to auscultation bilaterally without wheezes or crackles Cardiovascular: Regular rate and rhythm without murmur gallop or rub normal S1 and S2 Abdomen: negative abdominal pain, nondistended, positive soft, bowel sounds, no rebound, no ascites, no appreciable mass Extremities: No significant cyanosis, clubbing, or edema bilateral lower extremities.  RIGHT thigh mild swelling appropriate for recent surgery surgical dressing in place.  Negative sign of infection Skin: Negative rashes, lesions, ulcers Psychiatric:  Negative depression, positive moderate anxiety, negative fatigue, negative mania  Central nervous system:  Cranial nerves II through XII intact, tongue/uvula midline,  all extremities muscle strength 5/5, sensation intact throughout, negative dysarthria, negative expressive aphasia, negative receptive aphasia.  .     Data Reviewed: Care during the described time interval was provided by me .  I have reviewed this patient's available data, including medical history, events of note, physical examination, and all test results as part of my evaluation.   CBC: Recent Labs  Lab 06/16/19 0900 06/17/19 0211 06/18/19 0303 06/19/19 0325 06/20/19 0430 06/21/19 0228  WBC 4.6 4.4 10.5 7.9 7.7 10.0  NEUTROABS 3.5 3.9 9.5* 7.1  --  8.3*  HGB 11.7* 11.2* 9.9* 9.7* 9.2* 9.5*  HCT 37.1* 35.8* 31.5* 30.2* 28.2* 29.4*  MCV 93.2 93.0 91.8 91.8 88.4 89.4  PLT 217 217 210 208 245 [id number]   Basic Metabolic Panel: Recent Labs  Lab 06/16/19 0900 06/17/19 0211  06/18/19 0303 06/19/19 0325 06/20/19 0430 06/21/19 0228  NA 130* 134* 132* 133* 134* 133*  K 3.5 4.2 4.0 4.4 4.2 4.5  CL 100 105 102 104 106 104  CO2 21* 21* 23 23 23 23   GLUCOSE 93 180* 136* 129* 111* 128*  BUN 19 24* 30* 28* 30* 29*  CREATININE 0.84 0.79 0.81 0.72 0.73 0.70  CALCIUM 8.2* 9.1 8.9 9.0 8.8* 8.9  MG 1.8 1.8  --  2.0  --  1.8  PHOS 2.1* 2.6 2.6 1.8*  --  2.1*   GFR: Estimated Creatinine Clearance: 68.7 mL/min (by C-G formula based on SCr of 0.7 mg/dL). Liver Function Tests: Recent Labs  Lab 06/17/19 0211 06/18/19 0303 06/19/19 0325 06/20/19 0430 06/21/19 0228  AST 20 19 15 18 21   ALT 14 14 11 14 17   ALKPHOS 50 45 40 42 45  BILITOT 0.6 0.6 0.9 0.6 0.6  PROT 5.6* 5.1* 4.9* 4.6* 4.6*  ALBUMIN 3.1* 2.6* 2.8* 2.6* 2.6*   No results for input(s): LIPASE, AMYLASE in the last 168 hours. No results for input(s): AMMONIA in the last 168 hours. Coagulation Profile: No results for input(s): INR, PROTIME in the last 168 hours. Cardiac Enzymes: No results for input(s): CKTOTAL, CKMB, CKMBINDEX, TROPONINI in the last 168 hours. BNP (last 3 results) No results for input(s): PROBNP in the  last 8760 hours. HbA1C: No results for input(s): HGBA1C in the last 72 hours. CBG: Recent Labs  Lab 06/20/19 1725 06/20/19 1959 06/20/19 2302 06/21/19 0350 06/21/19 0729  GLUCAP 146* 144* 141* 120* 96   Lipid Profile: No results for input(s): CHOL, HDL, LDLCALC, TRIG, CHOLHDL, LDLDIRECT in the last 72 hours. Thyroid Function Tests: No results for input(s): TSH, T4TOTAL, FREET4, T3FREE, THYROIDAB in the last 72 hours. Anemia Panel: Recent Labs    06/20/19 0900 06/21/19 0228  VITAMINB12 1,844*  --   FOLATE 6.4  --   FERRITIN 212 189  TIBC 257  --   IRON 72  --   RETICCTPCT 1.5  --    Urine analysis:    Component Value Date/Time   COLORURINE YELLOW 05/04/2018 2140   APPEARANCEUR CLEAR 05/04/2018 2140   LABSPEC 1.028 05/04/2018 2140   PHURINE 6.0 05/04/2018 2140   GLUCOSEU NEGATIVE 05/04/2018 2140   HGBUR NEGATIVE 05/04/2018 2140   BILIRUBINUR NEGATIVE 05/04/2018 2140   KETONESUR NEGATIVE 05/04/2018 2140   PROTEINUR NEGATIVE 05/04/2018 2140   UROBILINOGEN 2.0 (H) 05/28/2015 0029   NITRITE NEGATIVE 05/04/2018 2140   LEUKOCYTESUR NEGATIVE 05/04/2018 2140   Sepsis Labs: @LABRCNTIP (procalcitonin:4,lacticidven:4)  ) Recent Results (from the past 240 hour(s))  SARS CORONAVIRUS 2 (TAT 6-24 HRS) Nasopharyngeal Nasopharyngeal Swab     Status: Abnormal   Collection Time: 06/15/19  8:46 PM   Specimen: Nasopharyngeal Swab  Result Value Ref Range Status   SARS Coronavirus 2 POSITIVE (A) NEGATIVE Final    Comment: RESULT CALLED TO, READ BACK BY AND VERIFIED WITH: Alanson Aly, RN AT [phone] ON 06/16/2019 BY SAINVILUS S (NOTE) SARS-CoV-2 target nucleic acids are DETECTED. The SARS-CoV-2 RNA is generally detectable in upper and lower respiratory specimens during the acute phase of infection. Positive results are indicative of active infection with SARS-CoV-2. Clinical  correlation with patient history and other diagnostic information is necessary to determine patient infection  status. Positive results do  not rule out bacterial infection or co-infection with other viruses. The expected result is Negative. Fact Sheet for Patients: SugarRoll.be Fact Sheet for Healthcare Providers: https://www.-mathews.com/ This test is not  yet approved or cleared by the [place] and  has been authorized for detection and/or diagnosis of SARS-CoV-2 by FDA under an Emergency Use Authorization (EUA). This EUA will remain  in effect (meaning this test  can be used) for the duration of the COVID-19 declaration under Section 564(b)(1) of the Act, 21 U.S.C. section 360bbb-3(b)(1), unless the authorization is terminated or revoked sooner. Performed at [facility], [place] [address], [place], [place] [address]   Surgical pcr screen     Status: None   Collection Time: 06/16/19  1:35 AM   Specimen: Nasal Mucosa; Nasal Swab  Result Value Ref Range Status   MRSA, PCR NEGATIVE NEGATIVE Final   Staphylococcus aureus NEGATIVE NEGATIVE Final    Comment: (NOTE) The Xpert SA Assay (FDA approved for NASAL specimens in patients 26 years of age and older), is one component of a comprehensive surveillance program. It is not intended to diagnose infection nor to guide or monitor treatment. Performed at [facility], [place] [address], [place], [place] [address]          Radiology Studies: No results found.      Scheduled Meds: . carvedilol  12.5 mg Oral BID WC  . Chlorhexidine Gluconate Cloth  6 each Topical Daily  . dexamethasone (DECADRON) injection  6 mg Intravenous Q24H  . docusate sodium  100 mg Oral BID  . enoxaparin (LOVENOX) injection  40 mg Subcutaneous Q12H  . fenofibrate  160 mg Oral Daily  . ferrous sulfate  325 mg Oral Q breakfast  . insulin aspart  0-9 Units Subcutaneous Q4H  . Ipratropium-Albuterol  1 puff Inhalation QID  . loratadine  10 mg Oral Daily  . losartan  50 mg Oral Daily  .  mouth rinse  15 mL Mouth Rinse BID  . pantoprazole  40 mg Oral Daily  . polyvinyl alcohol  1-2 drop Both Eyes QHS  . QUEtiapine  25 mg Oral QHS  . simvastatin  40 mg Oral q1800  . sodium chloride  2 spray Each Nare BID  . sodium chloride flush  10-40 mL Intracatheter Q12H  . vitamin C  500 mg Oral Daily  . zinc sulfate  220 mg Oral Daily   Continuous Infusions: . methocarbamol (ROBAXIN) IV Stopped (06/16/19 0215)     LOS: 6 days   The patient is critically ill with multiple organ systems failure and requires high complexity decision making for assessment and support, frequent evaluation and titration of therapies, application of advanced monitoring technologies and extensive interpretation of multiple databases. Critical Care Time devoted to patient care services described in this note  Time spent: 40 minutes     , Geraldo Docker, MD [facility] Pager [phone]  If 7PM-7AM, please contact night-coverage [email or web address] Password [facility] 06/21/2019, 2:59 PM

## 2019-06-21 NOTE — Progress Notes (Signed)
Occupational Therapy Evaluation  PTA, pt lived alone and was independent with mobility and ADL/IADL. Pt enjoys music and played the sax, flute and clarinet until @ 6 mos ago. Pt anxious during mobility but able to mobilize to recliner taking a few steps with +2 Min A @ RW level. Overall min to mod A with ADL tasks. Pt hypotensive(see doc flow) and complained of dizziness - nsg made aware. Pt will benefit from rehab at SNF. Will contact [facility] to visit pt as pt expressing feelings of sadness regarding loss of independence and what his future will be like with recent illness. Will follow acutely.     06/21/19 0725  OT Visit Information  Last OT Received On 06/21/19  Assistance Needed +2  PT/OT/SLP Co-Evaluation/Treatment Yes  Reason for Co-Treatment For patient/therapist safety;To address functional/ADL transfers  OT goals addressed during session ADL's and self-care  History of Present Illness Pt is 85 yo male admitted s/p fall iwth R intertrochanteric femur fraction and s/p intramedullary fixation on 06/17/19 with WBAT status.  Additionally, pt found to be COVID 19 positive.  Pt with  medical history significant of AAA, arthritis, history of prostate cancer, CAD status post CABG, chronic systolic congestive heart failure, GERD, hypertension, hyperlipidemia, RBBB.  Additionally pt with L IM nail 04/16/19 and ORIF patella 05/2015.  Precautions  Precautions Fall  Restrictions  Weight Bearing Restrictions Yes  RLE Weight Bearing WBAT  Home Living  Family/patient expects to be discharged to: Skilled nursing facility  Prior Function  Level of Independence Independent  Comments pt reports he was ambulating with no device prior to this fall  Communication  Communication HOH  Pain Assessment  Pain Assessment Faces  Faces Pain Scale 6  Pain Location R hip  Pain Descriptors / Indicators Aching;Discomfort;Grimacing  Pain Intervention(s) Limited activity within patient's tolerance;Repositioned;Ice  applied;Patient requesting pain meds-RN notified  Cognition  Arousal/Alertness Awake/alert  Behavior During Therapy Anxious  Overall Cognitive Status No family/caregiver present to determine baseline cognitive functioning  General Comments most likely at baseline  Upper Extremity Assessment  Upper Extremity Assessment Generalized weakness  Lower Extremity Assessment  Lower Extremity Assessment Defer to PT evaluation  ADL  Overall ADL's  Needs assistance/impaired  Grooming Set up;Sitting  Upper Body Bathing Set up;Sitting  Lower Body Bathing Minimal assistance;Sit to/from stand  Upper Body Dressing  Set up;Sitting  Lower Body Dressing Moderate assistance;Sit to/from Retail buyer Minimal assistance;+2 for physical assistance;RW;Ambulation  Toilet Transfer Details (indicate cue type and reason) simulated  Toileting- Clothing Manipulation and Hygiene Moderate assistance;Sit to/from stand  Functional mobility during ADLs Minimal assistance;+2 for physical assistance;Rolling walker;Cueing for sequencing;Cueing for safety  Vision- History  Baseline Vision/History Wears glasses  Bed Mobility  Overal bed mobility Needs Assistance  Bed Mobility Supine to Sit  Supine to sit Min assist;HOB elevated  General bed mobility comments pt does better when he is in control of moving  Transfers  Overall transfer level Needs assistance  Equipment used Rolling walker (2 wheeled)  Sit to Stand Min assist;+2 physical assistance  General transfer comment complained of feeling dizzy with mobility  Balance  Overall balance assessment Needs assistance;History of Falls  Sitting balance-Leahy Scale Good  Standing balance-Leahy Scale Poor  Exercises  Exercises Other exercises  Other Exercises  Other Exercises incentive spirometer x 10  - able to pull 203ml  Other Exercises flutter valve x 10  OT - End of Session  Equipment Utilized During Treatment Rolling walker;Gait belt  Activity Tolerance  Patient  tolerated treatment well  Patient left in chair;with call bell/phone within reach;with chair alarm set  Nurse Communication Mobility status;Other (comment) (BP)  OT Assessment  OT Recommendation/Assessment Patient needs continued OT Services  OT Visit Diagnosis Unsteadiness on feet (R26.81);Other abnormalities of gait and mobility (R26.89);Muscle weakness (generalized) (M62.81);History of falling (Z91.81);Pain  Pain - Right/Left Right  Pain - part of body Hip  OT Problem List Decreased strength;Decreased range of motion;Decreased activity tolerance;Impaired balance (sitting and/or standing);Decreased safety awareness;Decreased knowledge of use of DME or AE;Cardiopulmonary status limiting activity;Pain  OT Plan  OT Frequency (ACUTE ONLY) Min 2X/week  OT Treatment/Interventions (ACUTE ONLY) Self-care/ADL training;Therapeutic exercise;Neuromuscular education;Energy conservation;DME and/or AE instruction;Therapeutic activities;Patient/family education;Balance training  AM-PAC OT "6 Clicks" Daily Activity Outcome Measure (Version 2)  Help from another person eating meals? 4  Help from another person taking care of personal grooming? 3  Help from another person toileting, which includes using toliet, bedpan, or urinal? 2  Help from another person bathing (including washing, rinsing, drying)? 2  Help from another person to put on and taking off regular upper body clothing? 3  Help from another person to put on and taking off regular lower body clothing? 2  6 Click Score 16  OT Recommendation  Follow Up Recommendations SNF;Supervision/Assistance - 24 hour  OT Equipment 3 in 1 bedside commode  Individuals Consulted  Consulted and Agree with Results and Recommendations Patient  Acute Rehab OT Goals  Patient Stated Goal to get better adn be independent again  OT Goal Formulation With patient  Time For Goal Achievement 07/05/19  Potential to Achieve Goals Good  OT Time Calculation  OT  Start Time (ACUTE ONLY) 1006  OT Stop Time (ACUTE ONLY) 1045  OT Time Calculation (min) 39 min  OT General Charges  $OT Visit 1 Visit  OT Evaluation  $OT Eval Moderate Complexity 1 Mod  OT Treatments  $Self Care/Home Management  8-22 mins  Written Expression  Dominant Hand Right    Maurie Boettcher, OT/L   Acute OT Clinical Specialist [place] Pager [phone] Office [phone]

## 2019-06-22 LAB — CBC WITH DIFFERENTIAL/PLATELET
Abs Immature Granulocytes: 0.66 10*3/uL — ABNORMAL HIGH (ref 0.00–0.07)
Basophils Absolute: 0.1 10*3/uL (ref 0.0–0.1)
Basophils Relative: 1 %
Eosinophils Absolute: 0 10*3/uL (ref 0.0–0.5)
Eosinophils Relative: 0 %
HCT: 30.4 % — ABNORMAL LOW (ref 39.0–52.0)
Hemoglobin: 9.8 g/dL — ABNORMAL LOW (ref 13.0–17.0)
Immature Granulocytes: 6 %
Lymphocytes Relative: 9 %
Lymphs Abs: 1.1 10*3/uL (ref 0.7–4.0)
MCH: 28.7 pg (ref 26.0–34.0)
MCHC: 32.2 g/dL (ref 30.0–36.0)
MCV: 88.9 fL (ref 80.0–100.0)
Monocytes Absolute: 1 10*3/uL (ref 0.1–1.0)
Monocytes Relative: 8 %
Neutro Abs: 8.9 10*3/uL — ABNORMAL HIGH (ref 1.7–7.7)
Neutrophils Relative %: 76 %
Platelets: 307 10*3/uL (ref 150–400)
RBC: 3.42 MIL/uL — ABNORMAL LOW (ref 4.22–5.81)
RDW: 13.7 % (ref 11.5–15.5)
WBC: 11.7 10*3/uL — ABNORMAL HIGH (ref 4.0–10.5)
nRBC: 0 % (ref 0.0–0.2)

## 2019-06-22 LAB — GLUCOSE, CAPILLARY
Glucose-Capillary: 108 mg/dL — ABNORMAL HIGH (ref 70–99)
Glucose-Capillary: 154 mg/dL — ABNORMAL HIGH (ref 70–99)
Glucose-Capillary: 158 mg/dL — ABNORMAL HIGH (ref 70–99)
Glucose-Capillary: 85 mg/dL (ref 70–99)
Glucose-Capillary: 92 mg/dL (ref 70–99)
Glucose-Capillary: 98 mg/dL (ref 70–99)

## 2019-06-22 LAB — MAGNESIUM: Magnesium: 1.8 mg/dL (ref 1.7–2.4)

## 2019-06-22 LAB — FERRITIN: Ferritin: 184 ng/mL (ref 24–336)

## 2019-06-22 LAB — PHOSPHORUS: Phosphorus: 2.5 mg/dL (ref 2.5–4.6)

## 2019-06-22 LAB — LACTATE DEHYDROGENASE: LDH: 181 U/L (ref 98–192)

## 2019-06-22 LAB — C-REACTIVE PROTEIN: CRP: 0.8 mg/dL (ref <1.0)

## 2019-06-22 NOTE — TOC Progression Note (Addendum)
Transition of Care [facility]) - Progression Note    Patient Details  Name: Jesse [name] MRN: [id number] Date of Birth: 12-29-1927  [facility] CM/SW Contact  Loletha Grayer Beverely Pace, RN Phone Number: 06/22/2019, 11:34 AM  Clinical Narrative:   Case manager has completed FL2, obtained PASRR, spoke with patient via telephone concerning SNF options, he requested [facility]. Case manager will send information via the HUB to [employer], with Ronney Lion being his backup. Case manager also spoke with his son, [name] who appreciated the update.     Expected Discharge Plan: Skilled Nursing Facility Barriers to Discharge: Insurance Authorization  Expected Discharge Plan and Services Expected Discharge Plan: [place]   Discharge Planning Services: CM Consult Post Acute Care Choice: NA Living arrangements for the past 2 months: Single Family Home                 DME Arranged: N/A DME Agency: NA       HH Arranged: NA           Social Determinants of Health (SDOH) Interventions    Readmission Risk Interventions No flowsheet data found.

## 2019-06-22 NOTE — NC FL2 (Signed)
[place] LEVEL OF CARE SCREENING TOOL     IDENTIFICATION  Patient Name: Jesse [name] Birthdate: Jun 09, 1928 Sex: male Admission Date (Current Location): 06/15/2019  [facility] [place] Number:  Herbalist and Address:  [facility] [place], [place] [address], [place], [place] [address]([address]      Provider Number: [id number]  Attending Physician Name and Address:  Allie Bossier, MD  Relative Name and Phone Number:  Son: Benet Kitchell   [phone]    Current Level of Care:   Recommended Level of Care: [place] Prior Approval Number:    Date Approved/Denied:   PASRR Number: [id number]  Discharge Plan: SNF    Current Diagnoses: Patient Active Problem List   Diagnosis Date Noted  . Anxiety 06/21/2019  . Anemia due to chronic illness 06/20/2019  . Pneumonia due to COVID-19 virus 06/20/2019  . Displaced spiral fracture of shaft of right femur, initial encounter for open fracture type I or II ([place]) 06/20/2019  . COVID-19 virus infection 06/19/2019  . Surgery, elective   . Hip fracture ([place]) 04/15/2019  . Fall   . Coronary artery disease involving native coronary artery of native heart without angina pectoris 11/20/2018  . Thoracic aortic aneurysm without rupture ([place]) 11/20/2018  . Chronic systolic heart failure ([place]) 11/20/2018  . Duodenal ulcer with hemorrhage 05/05/2018  . Hemorrhagic shock ([place]) 05/05/2018  . AKI (acute kidney injury) ([place]) 05/05/2018  . Orthostatic hypotension 05/04/2018  . Symptomatic anemia 05/04/2018  . Melena 05/04/2018  . GI bleed 05/04/2018  . Patella fracture 06/09/2015  . Aftercare following surgery of the circulatory system, [place] 01/08/2013  . Pain in limb- Left popliteal 12/25/2012  . Aneurysm artery, femoral ([place]) 05/22/2012  . 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 Aneurysm of abdominal vessel ([place]) 02/21/2012  . Aneurysm of iliac artery (HCC) 08/09/2011  .  Aneurysm of artery of lower extremity ([place]) 08/09/2011  . HLD (hyperlipidemia) 01/16/2009  . Essential hypertension 01/16/2009  . CAD, ARTERY BYPASS GRAFT 01/16/2009  . PERIPHERAL VASCULAR DISEASE 01/16/2009  . ABDOMINAL AORTIC ANEURYSM REPAIR, HX OF 01/16/2009  . MIXED HYPERLIPIDEMIA 12/28/2008    Orientation RESPIRATION BLADDER Height & Weight     Self, Time, Situation, Place(little forgetful)  Normal Continent Weight: 87.9 kg Height:  6' (182.9 cm)  BEHAVIORAL SYMPTOMS/MOOD NEUROLOGICAL BOWEL NUTRITION STATUS      Continent Diet  AMBULATORY STATUS COMMUNICATION OF NEEDS Skin   Limited Assist Verbally Normal, Surgical wounds(s/p IM Nailing of femur fracture)                       Personal Care Assistance Level of Assistance  Bathing, Dressing Bathing Assistance: Limited assistance   Dressing Assistance: Limited assistance     Functional Limitations Info             SPECIAL CARE FACTORS FREQUENCY  PT (By licensed PT), OT (By licensed OT)     PT Frequency: 5x/week OT Frequency: min 3x/week            Contractures Contractures Info: Not present    Additional Factors Info  Code Status, Allergies Code Status Info: DNR Allergies Info: Quinolones, Adhesive tape-itching, Lactose Intolerant- gi upset           Current Medications (06/22/2019):  This is the current hospital active medication list Current Facility-Administered Medications  Medication Dose Route Frequency Provider Last Rate Last Dose  . acetaminophen (TYLENOL) tablet  500 mg  500 mg Oral Q6H PRN Rod Can, MD   500 mg at 06/20/19 2151  . carvedilol (COREG) tablet 12.5 mg  12.5 mg Oral BID WC Rod Can, MD   12.5 mg at 06/22/19 0934  . Chlorhexidine Gluconate Cloth 2 % PADS 6 each  6 each Topical Daily Raiford Noble [place], [place]   6 each at 06/22/19 0935  . chlorpheniramine-HYDROcodone (TUSSIONEX) 10-8 MG/5ML suspension 5 mL  5 mL Oral Q12H PRN Swinteck, Aaron Edelman, MD      . dexamethasone  (DECADRON) injection 6 mg  6 mg Intravenous Q24H Rod Can, MD   6 mg at 06/22/19 0933  . diphenhydrAMINE (BENADRYL) capsule 25 mg  25 mg Oral Q8H PRN Peyton Bottoms, MD   25 mg at 06/20/19 2151  . docusate sodium (COLACE) capsule 100 mg  100 mg Oral BID Rod Can, MD   100 mg at 06/22/19 0934  . enoxaparin (LOVENOX) injection 40 mg  40 mg Subcutaneous Q12H Pierce, Dwayne A, RPH   40 mg at 06/22/19 0933  . fenofibrate tablet 160 mg  160 mg Oral Daily Rod Can, MD   160 mg at 06/22/19 0934  . ferrous sulfate tablet 325 mg  325 mg Oral Q breakfast Rod Can, MD   325 mg at 06/21/19 2246  . guaiFENesin-dextromethorphan (ROBITUSSIN DM) 100-10 MG/5ML syrup 5 mL  5 mL Oral Q4H PRN Swinteck, Aaron Edelman, MD      . HYDROcodone-acetaminophen (NORCO/VICODIN) 5-325 MG per tablet 1-2 tablet  1-2 tablet Oral Q6H PRN Rod Can, MD   1 tablet at 06/21/19 2245  . insulin aspart (novoLOG) injection 0-9 Units  0-9 Units Subcutaneous Q4H Raiford Noble [place], [place]   1 Units at 06/21/19 1635  . Ipratropium-Albuterol (COMBIVENT) respimat 1 puff  1 puff Inhalation QID Allie Bossier, MD   1 puff at 06/22/19 0935  . loratadine (CLARITIN) tablet 10 mg  10 mg Oral Daily Rod Can, MD   10 mg at 06/22/19 0934  . losartan (COZAAR) tablet 50 mg  50 mg Oral Daily Rod Can, MD   50 mg at 06/22/19 0934  . MEDLINE mouth rinse  15 mL Mouth Rinse BID Raiford Noble [place], DO   15 mL at 06/22/19 0935  . menthol-cetylpyridinium (CEPACOL) lozenge 3 mg  1 lozenge Oral PRN Swinteck, Aaron Edelman, MD       Or  . phenol (CHLORASEPTIC) mouth spray 1 spray  1 spray Mouth/Throat PRN Swinteck, Aaron Edelman, MD      . methocarbamol (ROBAXIN) tablet 500 mg  500 mg Oral Q6H PRN Rod Can, MD   500 mg at 06/22/19 0933   Or  . methocarbamol (ROBAXIN) 500 mg in dextrose 5 % 50 mL IVPB  500 mg Intravenous Q6H PRN Rod Can, MD   Stopped at 06/16/19 0215  . metoCLOPramide (REGLAN) tablet 5-10 mg  5-10 mg Oral Q8H PRN  Swinteck, Aaron Edelman, MD       Or  . metoCLOPramide (REGLAN) injection 5-10 mg  5-10 mg Intravenous Q8H PRN Swinteck, Aaron Edelman, MD      . morphine 2 MG/ML injection 0.5 mg  0.5 mg Intravenous Q2H PRN Rod Can, MD   0.5 mg at 06/17/19 1442  . ondansetron (ZOFRAN) tablet 4 mg  4 mg Oral Q6H PRN Swinteck, Aaron Edelman, MD       Or  . ondansetron (ZOFRAN) injection 4 mg  4 mg Intravenous Q6H PRN Swinteck, Aaron Edelman, MD      . pantoprazole (PROTONIX) EC tablet  40 mg  40 mg Oral Daily Rod Can, MD   40 mg at 06/22/19 0934  . polyvinyl alcohol (LIQUIFILM TEARS) 1.4 % ophthalmic solution 1-2 drop  1-2 drop Both Eyes QHS Rod Can, MD   2 drop at 06/19/19 2136  . QUEtiapine (SEROQUEL) tablet 25 mg  25 mg Oral QHS Allie Bossier, MD   25 mg at 06/21/19 2245  . simvastatin (ZOCOR) tablet 40 mg  40 mg Oral q1800 Rod Can, MD   40 mg at 06/21/19 2245  . sodium chloride (OCEAN) 0.65 % nasal spray 2 spray  2 spray Each Nare BID Rod Can, MD   2 spray at 06/22/19 0935  . sodium chloride flush (NS) 0.9 % injection 10-40 mL  10-40 mL Intracatheter Q12H Sheikh, Georgina Quint [place], DO   10 mL at 06/22/19 0936  . sodium chloride flush (NS) 0.9 % injection 10-40 mL  10-40 mL Intracatheter PRN Sheikh, Omair Latif, DO      . vitamin C (ASCORBIC ACID) tablet 500 mg  500 mg Oral Daily Rod Can, MD   500 mg at 06/22/19 0934  . zinc sulfate capsule 220 mg  220 mg Oral Daily Rod Can, MD   220 mg at 06/22/19 [id number]     Discharge Medications: Please see discharge summary for a list of discharge medications.  Relevant Imaging Results:  Relevant Lab Results:   Additional Information ssn# [id number]  Ninfa Meeker, RN

## 2019-06-22 NOTE — Progress Notes (Signed)
PROGRESS NOTE    Jesse [name]  [id number] DOB: 1928/05/12 DOA: 06/15/2019 PCP: [name], MD   Brief Narrative:  85 y.o. WM PMHx Arthritis, Hx prostate cancer, AAA, CAD status post CABG, chronic systolic CHF, essential HTN, HLD, RBBB, GERD,   Presenting to the ED for evaluation of right hip pain after a fall.  Patient states he was walking in his driveway and stepped on a stone.  He then bent down to pick up the stone and as he was trying to throw it away he lost his balance and fell on his right hip.  Since then he is having pain in this area.  He did not hit his head or lose consciousness.  He was not having any lightheadedness/dizziness, chest pain, or shortness of breath prior to the fall.  States he has had problems with his balance for the past few years and 2 months ago fell and fractured his left hip.   Subjective: 11/28 A/O x4, negative CP, negative abdominal pain, negative N/V, negative S OB.     Assessment & Plan:   Principal Problem:   COVID-19 virus infection Active Problems:   HLD (hyperlipidemia)   Essential hypertension   CAD, ARTERY BYPASS GRAFT   Coronary artery disease involving native coronary artery of native heart without angina pectoris   Chronic systolic heart failure (HCC)   Hip fracture (HCC)   Anemia due to chronic illness   Pneumonia due to COVID-19 virus   Displaced spiral fracture of shaft of right femur, initial encounter for open fracture type I or II (HCC)   Anxiety   Right intertrochanteric femur fracture, nondisplaced, -S/p RIGHT femur  IM fixation  -Pain control as needed (Norco and Robaxin).  Bowel regimen.  Up with therapy.   -11/26 PT/OT; RIGHT femur fracture consult recommend SNF      Covid pneumonia COVID-19 Labs  Recent Labs    06/20/19 0430 06/20/19 0900 06/21/19 0228 06/22/19 0433  DDIMER 3.63*  --  5.96*  --   FERRITIN 190 212 189 184  LDH 139  --  158 181  CRP 1.4*  --  0.9 0.8    Lab Results  Component Value  Date   SARSCOV2NAA POSITIVE (A) 06/15/2019   SARSCOV2NAA NEGATIVE 04/19/2019   [place] NEGATIVE 04/15/2019  -Decadron 6 mg daily -Remdesivir per pharmacy protocol -Combivent -Flutter valve -Incentive spirometry -Titrate O2 to maintain SPO2> 88% -Vitamins per Covid protocol   Chronic Systolic and Diastolic CHF -[id number] echocardiogram; EF 40 to [id number], grade 1 diastolic dysfunction -Strict in and out -863.93ml -Daily weight Filed Weights   06/19/19 0335 06/21/19 0600 06/22/19 0500  Weight: 84 kg 87.9 kg 87.9 kg    CAD/Hx CABG -Coreg 12.5 mg BID -Losartan 50 mg daily  Essential Hypertension/Hypotension -See CAD -11/27 patient hypotensive, Albumin 50 g + bolus normal saline 562ml  HLD -Fenofibrate 160 mg daily  GERD Continue PPI  Normocytic/postoperative blood loss anemia? Recent Labs  Lab 06/18/19 0303 06/19/19 0325 06/20/19 0430 06/21/19 0228 06/22/19 0433  HGB 9.9* 9.7* 9.2* 9.5* 9.8*  -Slightly trending down -Anemia panel; consistent with normocytic anemia -Fecal occult pending  Hyperglycemia -11/24 hemoglobin A1c = 5.2  -Sensitive SSI  Anxiety -Seroquel 25 mg qhs    DVT prophylaxis: Lovenox Code Status: DNR Family Communication: 11/28 left message for [name] (son) that had attempted to contact him to update him concerning fathers plan of care. Disposition Plan: TBD   Consultants:  Orthopedic surgery     Procedures/Significant Events:  S/p RIGHT femur  IM fixation    I have personally reviewed and interpreted all radiology studies and my findings are as above.  VENTILATOR SETTINGS: Room air.  11/28   Cultures 11/21 SARS coronavirus positive    Antimicrobials: Anti-infectives (From admission, onward)   Start     Stop   06/18/19 1000  remdesivir 100 mg in sodium chloride 0.9 % 250 mL IVPB     06/22/19 0959   06/17/19 1600  ceFAZolin (ANCEF) IVPB 2g/100 mL premix     06/17/19 2203   06/17/19 1142  ceFAZolin (ANCEF) 2-4  GM/100ML-% IVPB    Note to Pharmacy: [place]  : cabinet override   06/17/19 1528   06/17/19 1000  remdesivir 200 mg in sodium chloride 0.9 % 250 mL IVPB     06/17/19 0911   06/16/19 0600  ceFAZolin (ANCEF) IVPB 2g/100 mL premix  Status:  Discontinued     06/17/19 0559   06/16/19 0600  ceFAZolin (ANCEF) IVPB 2g/100 mL premix  Status:  Discontinued     06/15/19 2308       Devices    LINES / TUBES:      Continuous Infusions: . methocarbamol (ROBAXIN) IV Stopped (06/16/19 0215)     Objective: Vitals:   06/22/19 0445 06/22/19 0500 06/22/19 0728 06/22/19 0819  BP:   (!) 145/91   Pulse: (!) 58  60   Resp:   18   Temp:   98.2 F (36.8 C)   TempSrc:   Oral   SpO2: 97%  95% 97%  Weight:  87.9 kg    Height:        Intake/Output Summary (Last 24 hours) at 06/22/2019 1621 Last data filed at 06/22/2019 1521 Gross per 24 hour  Intake 480 ml  Output 700 ml  Net -220 ml   Filed Weights   06/19/19 0335 06/21/19 0600 06/22/19 0500  Weight: 84 kg 87.9 kg 87.9 kg   Physical Exam:  General: A/O x4, no acute respiratory distress Eyes: negative scleral hemorrhage, negative anisocoria, negative icterus ENT: Negative Runny nose, negative gingival bleeding, Neck:  Negative scars, masses, torticollis, lymphadenopathy, JVD Lungs: Clear to auscultation bilaterally without wheezes or crackles Cardiovascular: Regular rate and rhythm without murmur gallop or rub normal S1 and S2 Abdomen: negative abdominal pain, nondistended, positive soft, bowel sounds, no rebound, no ascites, no appreciable mass Extremities: No significant cyanosis, clubbing, or edema LEFT lower extremity.  RIGHT mid thigh mild swelling surgical dressing in place no signs of infection Skin: Negative rashes, lesions, ulcers Psychiatric:  Negative depression, positive anxiety, negative fatigue, negative mania  Central nervous system:  Cranial nerves II through XII intact, tongue/uvula midline, all extremities  muscle strength 5/5, sensation intact throughout,  negative dysarthria, negative expressive aphasia, negative receptive aphasia.  .     Data Reviewed: Care during the described time interval was provided by me .  I have reviewed this patient's available data, including medical history, events of note, physical examination, and all test results as part of my evaluation.   CBC: Recent Labs  Lab 06/17/19 0211 06/18/19 0303 06/19/19 0325 06/20/19 0430 06/21/19 0228 06/22/19 0433  WBC 4.4 10.5 7.9 7.7 10.0 11.7*  NEUTROABS 3.9 9.5* 7.1  --  8.3* 8.9*  HGB 11.2* 9.9* 9.7* 9.2* 9.5* 9.8*  HCT 35.8* 31.5* 30.2* 28.2* 29.4* 30.4*  MCV 93.0 91.8 91.8 88.4 89.4 88.9  PLT 217 210 208 245 269 [id number]   Basic Metabolic Panel: Recent Labs  Lab  06/16/19 0900 06/17/19 0211 06/18/19 0303 06/19/19 0325 06/20/19 0430 06/21/19 0228 06/22/19 0433  NA 130* 134* 132* 133* 134* 133*  --   K 3.5 4.2 4.0 4.4 4.2 4.5  --   CL 100 105 102 104 106 104  --   CO2 21* 21* 23 23 23 23   --   GLUCOSE 93 180* 136* 129* 111* 128*  --   BUN 19 24* 30* 28* 30* 29*  --   CREATININE 0.84 0.79 0.81 0.72 0.73 0.70  --   CALCIUM 8.2* 9.1 8.9 9.0 8.8* 8.9  --   MG 1.8 1.8  --  2.0  --  1.8 1.8  PHOS 2.1* 2.6 2.6 1.8*  --  2.1* 2.5   GFR: Estimated Creatinine Clearance: 68.7 mL/min (by C-G formula based on SCr of 0.7 mg/dL). Liver Function Tests: Recent Labs  Lab 06/17/19 0211 06/18/19 0303 06/19/19 0325 06/20/19 0430 06/21/19 0228  AST 20 19 15 18 21   ALT 14 14 11 14 17   ALKPHOS 50 45 40 42 45  BILITOT 0.6 0.6 0.9 0.6 0.6  PROT 5.6* 5.1* 4.9* 4.6* 4.6*  ALBUMIN 3.1* 2.6* 2.8* 2.6* 2.6*   No results for input(s): LIPASE, AMYLASE in the last 168 hours. No results for input(s): AMMONIA in the last 168 hours. Coagulation Profile: No results for input(s): INR, PROTIME in the last 168 hours. Cardiac Enzymes: No results for input(s): CKTOTAL, CKMB, CKMBINDEX, TROPONINI in the last 168 hours. BNP (last 3  results) No results for input(s): PROBNP in the last 8760 hours. HbA1C: No results for input(s): HGBA1C in the last 72 hours. CBG: Recent Labs  Lab 06/21/19 2021 06/22/19 0034 06/22/19 0430 06/22/19 0727 06/22/19 1140  GLUCAP 138* 108* 92 85 98   Lipid Profile: No results for input(s): CHOL, HDL, LDLCALC, TRIG, CHOLHDL, LDLDIRECT in the last 72 hours. Thyroid Function Tests: No results for input(s): TSH, T4TOTAL, FREET4, T3FREE, THYROIDAB in the last 72 hours. Anemia Panel: Recent Labs    06/20/19 0900 06/21/19 0228 06/22/19 0433  VITAMINB12 1,844*  --   --   FOLATE 6.4  --   --   FERRITIN 212 189 184  TIBC 257  --   --   IRON 72  --   --   RETICCTPCT 1.5  --   --    Urine analysis:    Component Value Date/Time   COLORURINE YELLOW 05/04/2018 2140   APPEARANCEUR CLEAR 05/04/2018 2140   LABSPEC 1.028 05/04/2018 2140   PHURINE 6.0 05/04/2018 2140   GLUCOSEU NEGATIVE 05/04/2018 2140   HGBUR NEGATIVE 05/04/2018 2140   BILIRUBINUR NEGATIVE 05/04/2018 2140   KETONESUR NEGATIVE 05/04/2018 2140   PROTEINUR NEGATIVE 05/04/2018 2140   UROBILINOGEN 2.0 (H) 05/28/2015 0029   NITRITE NEGATIVE 05/04/2018 2140   LEUKOCYTESUR NEGATIVE 05/04/2018 2140   Sepsis Labs: @LABRCNTIP (procalcitonin:4,lacticidven:4)  ) Recent Results (from the past 240 hour(s))  SARS CORONAVIRUS 2 (TAT 6-24 HRS) Nasopharyngeal Nasopharyngeal Swab     Status: Abnormal   Collection Time: 06/15/19  8:46 PM   Specimen: Nasopharyngeal Swab  Result Value Ref Range Status   SARS Coronavirus 2 POSITIVE (A) NEGATIVE Final    Comment: RESULT CALLED TO, READ BACK BY AND VERIFIED WITH: Alanson Aly, RN AT [phone] ON 06/16/2019 BY SAINVILUS S (NOTE) SARS-CoV-2 target nucleic acids are DETECTED. The SARS-CoV-2 RNA is generally detectable in upper and lower respiratory specimens during the acute phase of infection. Positive results are indicative of active infection with SARS-CoV-2. Clinical  correlation with  patient history and other diagnostic information is necessary to determine patient infection status. Positive results do  not rule out bacterial infection or co-infection with other viruses. The expected result is Negative. Fact Sheet for Patients: SugarRoll.be Fact Sheet for Healthcare Providers: https://www.Eric Nees-mathews.com/ This test is not yet approved or cleared by the [place] FDA and  has been authorized for detection and/or diagnosis of SARS-CoV-2 by FDA under an Emergency Use Authorization (EUA). This EUA will remain  in effect (meaning this test  can be used) for the duration of the COVID-19 declaration under Section 564(b)(1) of the Act, 21 U.S.C. section 360bbb-3(b)(1), unless the authorization is terminated or revoked sooner. Performed at [facility], [place] [address], [place], [place] [address]   Surgical pcr screen     Status: None   Collection Time: 06/16/19  1:35 AM   Specimen: Nasal Mucosa; Nasal Swab  Result Value Ref Range Status   MRSA, PCR NEGATIVE NEGATIVE Final   Staphylococcus aureus NEGATIVE NEGATIVE Final    Comment: (NOTE) The Xpert SA Assay (FDA approved for NASAL specimens in patients 72 years of age and older), is one component of a comprehensive surveillance program. It is not intended to diagnose infection nor to guide or monitor treatment. Performed at [facility], [place] [address] [place], [place] [address]          Radiology Studies: No results found.      Scheduled Meds: . carvedilol  12.5 mg Oral BID WC  . Chlorhexidine Gluconate Cloth  6 each Topical Daily  . dexamethasone (DECADRON) injection  6 mg Intravenous Q24H  . docusate sodium  100 mg Oral BID  . enoxaparin (LOVENOX) injection  40 mg Subcutaneous Q12H  . fenofibrate  160 mg Oral Daily  . ferrous sulfate  325 mg Oral Q breakfast  . insulin aspart  0-9 Units Subcutaneous Q4H  . Ipratropium-Albuterol   1 puff Inhalation QID  . loratadine  10 mg Oral Daily  . losartan  50 mg Oral Daily  . mouth rinse  15 mL Mouth Rinse BID  . pantoprazole  40 mg Oral Daily  . polyvinyl alcohol  1-2 drop Both Eyes QHS  . QUEtiapine  25 mg Oral QHS  . simvastatin  40 mg Oral q1800  . sodium chloride  2 spray Each Nare BID  . sodium chloride flush  10-40 mL Intracatheter Q12H  . vitamin C  500 mg Oral Daily  . zinc sulfate  220 mg Oral Daily   Continuous Infusions: . methocarbamol (ROBAXIN) IV Stopped (06/16/19 0215)     LOS: 7 days   The patient is critically ill with multiple organ systems failure and requires high complexity decision making for assessment and support, frequent evaluation and titration of therapies, application of advanced monitoring technologies and extensive interpretation of multiple databases. Critical Care Time devoted to patient care services described in this note  Time spent: 40 minutes     Shanice Poznanski, Geraldo Docker, MD [facility] Pager [phone]  If 7PM-7AM, please contact night-coverage [email or web address] Password [facility] 06/22/2019, 4:21 PM

## 2019-06-23 LAB — CBC WITH DIFFERENTIAL/PLATELET
Abs Immature Granulocytes: 1.06 10*3/uL — ABNORMAL HIGH (ref 0.00–0.07)
Basophils Absolute: 0.1 10*3/uL (ref 0.0–0.1)
Basophils Relative: 1 %
Eosinophils Absolute: 0 10*3/uL (ref 0.0–0.5)
Eosinophils Relative: 0 %
HCT: 30.6 % — ABNORMAL LOW (ref 39.0–52.0)
Hemoglobin: 10 g/dL — ABNORMAL LOW (ref 13.0–17.0)
Immature Granulocytes: 8 %
Lymphocytes Relative: 6 %
Lymphs Abs: 0.8 10*3/uL (ref 0.7–4.0)
MCH: 28.9 pg (ref 26.0–34.0)
MCHC: 32.7 g/dL (ref 30.0–36.0)
MCV: 88.4 fL (ref 80.0–100.0)
Monocytes Absolute: 1.3 10*3/uL — ABNORMAL HIGH (ref 0.1–1.0)
Monocytes Relative: 9 %
Neutro Abs: 10.4 10*3/uL — ABNORMAL HIGH (ref 1.7–7.7)
Neutrophils Relative %: 76 %
Platelets: 323 10*3/uL (ref 150–400)
RBC: 3.46 MIL/uL — ABNORMAL LOW (ref 4.22–5.81)
RDW: 14.1 % (ref 11.5–15.5)
WBC: 13.6 10*3/uL — ABNORMAL HIGH (ref 4.0–10.5)
nRBC: 0 % (ref 0.0–0.2)

## 2019-06-23 LAB — LACTATE DEHYDROGENASE: LDH: 208 U/L — ABNORMAL HIGH (ref 98–192)

## 2019-06-23 LAB — PHOSPHORUS: Phosphorus: 3.1 mg/dL (ref 2.5–4.6)

## 2019-06-23 LAB — COMPREHENSIVE METABOLIC PANEL
ALT: 19 U/L (ref 0–44)
AST: 19 U/L (ref 15–41)
Albumin: 2.8 g/dL — ABNORMAL LOW (ref 3.5–5.0)
Alkaline Phosphatase: 47 U/L (ref 38–126)
Anion gap: 8 (ref 5–15)
BUN: 28 mg/dL — ABNORMAL HIGH (ref 8–23)
CO2: 20 mmol/L — ABNORMAL LOW (ref 22–32)
Calcium: 8.9 mg/dL (ref 8.9–10.3)
Chloride: 103 mmol/L (ref 98–111)
Creatinine, Ser: 0.73 mg/dL (ref 0.61–1.24)
GFR calc Af Amer: >60 mL/min (ref >60)
GFR calc non Af Amer: >60 mL/min (ref >60)
Glucose, Bld: 107 mg/dL — ABNORMAL HIGH (ref 70–99)
Potassium: 4.3 mmol/L (ref 3.5–5.1)
Sodium: 131 mmol/L — ABNORMAL LOW (ref 135–145)
Total Bilirubin: 0.9 mg/dL (ref 0.3–1.2)
Total Protein: 4.9 g/dL — ABNORMAL LOW (ref 6.5–8.1)

## 2019-06-23 LAB — MAGNESIUM: Magnesium: 1.9 mg/dL (ref 1.7–2.4)

## 2019-06-23 LAB — GLUCOSE, CAPILLARY
Glucose-Capillary: 105 mg/dL — ABNORMAL HIGH (ref 70–99)
Glucose-Capillary: 108 mg/dL — ABNORMAL HIGH (ref 70–99)
Glucose-Capillary: 97 mg/dL (ref 70–99)
Glucose-Capillary: 129 mg/dL — ABNORMAL HIGH (ref 70–99)
Glucose-Capillary: 125 mg/dL — ABNORMAL HIGH (ref 70–99)
Glucose-Capillary: 156 mg/dL — ABNORMAL HIGH (ref 70–99)

## 2019-06-23 LAB — C-REACTIVE PROTEIN: CRP: 1 mg/dL — ABNORMAL HIGH (ref <1.0)

## 2019-06-23 LAB — D-DIMER, QUANTITATIVE: D-Dimer, Quant: 3.94 ug/mL-FEU — ABNORMAL HIGH (ref 0.00–0.50)

## 2019-06-23 LAB — FERRITIN: Ferritin: 179 ng/mL (ref 24–336)

## 2019-06-23 NOTE — TOC Progression Note (Signed)
Transition of Care [facility]) - Progression Note    Patient Details  Name: Jesse [name] MRN: [id number] Date of Birth: May 21, 1928  Transition of Care [facility]) CM/SW [place], [place] Phone Number: [phone] 06/23/2019, 3:40 PM  Clinical Narrative:     CSW has initiated insurance authorization with [facility] Ref # [id number]. Patient will need updated PT note for insurance auth, CSW has reached out to PT who reports they will see patient tomorrow 11/30.   Expected Discharge Plan: Skilled Nursing Facility Barriers to Discharge: Insurance Authorization  Expected Discharge Plan and Services Expected Discharge Plan: [place]   Discharge Planning Services: CM Consult Post Acute Care Choice: NA Living arrangements for the past 2 months: Single Family Home                 DME Arranged: N/A DME Agency: NA       HH Arranged: NA           Social Determinants of Health (SDOH) Interventions    Readmission Risk Interventions No flowsheet data found.

## 2019-06-23 NOTE — Progress Notes (Signed)
PROGRESS NOTE    Jesse [name]  [id number] DOB: 28-Nov-1927 DOA: 06/15/2019 PCP: [name], MD   Brief Narrative:  85 y.o. WM PMHx Arthritis, Hx prostate cancer, AAA, CAD status post CABG, chronic systolic CHF, essential HTN, HLD, RBBB, GERD,   Presenting to the ED for evaluation of right hip pain after a fall.  Patient states he was walking in his driveway and stepped on a stone.  He then bent down to pick up the stone and as he was trying to throw it away he lost his balance and fell on his right hip.  Since then he is having pain in this area.  He did not hit his head or lose consciousness.  He was not having any lightheadedness/dizziness, chest pain, or shortness of breath prior to the fall.  States he has had problems with his balance for the past few years and 2 months ago fell and fractured his left hip.   Subjective: 11/29 last 24 hours afebrile, negative CP, negative abdominal pain, negative N/V.  Negative S OB.    Assessment & Plan:   Principal Problem:   COVID-19 virus infection Active Problems:   HLD (hyperlipidemia)   Essential hypertension   CAD, ARTERY BYPASS GRAFT   Coronary artery disease involving native coronary artery of native heart without angina pectoris   Chronic systolic heart failure (HCC)   Hip fracture (HCC)   Anemia due to chronic illness   Pneumonia due to COVID-19 virus   Displaced spiral fracture of shaft of right femur, initial encounter for open fracture type I or II (HCC)   Anxiety   Right intertrochanteric femur fracture, nondisplaced, -S/p RIGHT femur  IM fixation  -Pain control as needed (Norco and Robaxin).  Bowel regimen.  Up with therapy.   -11/26 PT/OT; RIGHT femur fracture consult recommend SNF      Covid pneumonia COVID-19 Labs  Recent Labs    06/21/19 0228 06/22/19 0433 06/23/19 0149  DDIMER 5.96*  --  3.94*  FERRITIN 189 184 179  LDH 158 181 208*  CRP 0.9 0.8 1.0*    Lab Results  Component Value Date   SARSCOV2NAA POSITIVE (A) 06/15/2019   SARSCOV2NAA NEGATIVE 04/19/2019   [place] NEGATIVE 04/15/2019  -Decadron 6 mg daily -Remdesivir per pharmacy protocol -Combivent -Flutter valve -Incentive spirometry -Titrate O2 to maintain SPO2> 88% -Vitamins per Covid protocol   Chronic Systolic and Diastolic CHF -[id number] echocardiogram; EF 40 to AB-[id number], grade 1 diastolic dysfunction -Strict in and out -743.63ml -Daily weight Filed Weights   06/21/19 0600 06/22/19 0500 06/23/19 0300  Weight: 87.9 kg 87.9 kg 79.5 kg    CAD/Hx CABG -Coreg 12.5 mg BID -Losartan 50 mg daily  Essential Hypertension/Hypotension -See CAD -11/27 patient hypotensive, Albumin 50 g + bolus normal saline 589ml  HLD -Fenofibrate 160 mg daily  GERD Continue PPI  Normocytic/postoperative blood loss anemia? Recent Labs  Lab 06/19/19 0325 06/20/19 0430 06/21/19 0228 06/22/19 0433 06/23/19 0149  HGB 9.7* 9.2* 9.5* 9.8* 10.0*  -Slightly trending down -Anemia panel; consistent with normocytic anemia -Fecal occult pending  Hyperglycemia -11/24 hemoglobin A1c = 5.2  -Sensitive SSI  Anxiety -Seroquel 25 mg qhs  Goals of care -11/29 spoke with NCM Aldona [place] awaiting clearance from [facility] for SNF     DVT prophylaxis: Lovenox Code Status: DNR Family Communication: 11/28 left message for [name] (son) that had attempted to contact him to update him concerning fathers plan of care. Disposition Plan: TBD   Consultants:  Orthopedic surgery     Procedures/Significant Events:  S/p RIGHT femur  IM fixation    I have personally reviewed and interpreted all radiology studies and my findings are as above.  VENTILATOR SETTINGS: Room air 11/29 SPO2; 97%    Cultures 11/21 SARS coronavirus positive    Antimicrobials: Anti-infectives (From admission, onward)   Start     Stop   06/18/19 1000  remdesivir 100 mg in sodium chloride 0.9 % 250 mL IVPB     06/22/19 0959    06/17/19 1600  ceFAZolin (ANCEF) IVPB 2g/100 mL premix     06/17/19 2203   06/17/19 1142  ceFAZolin (ANCEF) 2-4 GM/100ML-% IVPB    Note to Pharmacy: [place]  : cabinet override   06/17/19 1528   06/17/19 1000  remdesivir 200 mg in sodium chloride 0.9 % 250 mL IVPB     06/17/19 0911   06/16/19 0600  ceFAZolin (ANCEF) IVPB 2g/100 mL premix  Status:  Discontinued     06/17/19 0559   06/16/19 0600  ceFAZolin (ANCEF) IVPB 2g/100 mL premix  Status:  Discontinued     06/15/19 2308       Devices    LINES / TUBES:      Continuous Infusions: . methocarbamol (ROBAXIN) IV Stopped (06/16/19 0215)     Objective: Vitals:   06/23/19 0009 06/23/19 0300 06/23/19 0735 06/23/19 0840  BP: (!) 149/88 112/75 138/79   Pulse: 63 (!) 59 67   Resp: 18 20 20    Temp: 97.8 F (36.6 C) 97.9 F (36.6 C) 98 F (36.7 C)   TempSrc: Oral Oral Oral   SpO2: 96% 96% 97% 97%  Weight:  79.5 kg    Height:        Intake/Output Summary (Last 24 hours) at 06/23/2019 1055 Last data filed at 06/23/2019 [id number] Gross per 24 hour  Intake 720 ml  Output 600 ml  Net 120 ml   Filed Weights   06/21/19 0600 06/22/19 0500 06/23/19 0300  Weight: 87.9 kg 87.9 kg 79.5 kg    Physical Exam:  General: A/O x4 no acute respiratory distress Eyes: negative scleral hemorrhage, negative anisocoria, negative icterus ENT: Negative Runny nose, negative gingival bleeding, Neck:  Negative scars, masses, torticollis, lymphadenopathy, JVD Lungs: Clear to auscultation bilaterally without wheezes or crackles Cardiovascular: Regular rate and rhythm without murmur gallop or rub normal S1 and S2 Abdomen: negative abdominal pain, nondistended, positive soft, bowel sounds, no rebound, no ascites, no appreciable mass Extremities: No significant cyanosis, clubbing, or edema LEFT lower extremity.  RIGHT mid thigh mild swelling surgical dressing in place, no sign of infection Skin: Negative rashes, lesions, ulcers  Psychiatric:  Negative depression, positive anxiety, negative fatigue, negative mania Central nervous system:  Cranial nerves II through XII intact, tongue/uvula midline, all extremities muscle strength 5/5, sensation intact throughout, negative dysarthria, negative expressive aphasia, negative receptive aphasia.     Data Reviewed: Care during the described time interval was provided by me .  I have reviewed this patient's available data, including medical history, events of note, physical examination, and all test results as part of my evaluation.   CBC: Recent Labs  Lab 06/18/19 0303 06/19/19 0325 06/20/19 0430 06/21/19 0228 06/22/19 0433 06/23/19 0149  WBC 10.5 7.9 7.7 10.0 11.7* 13.6*  NEUTROABS 9.5* 7.1  --  8.3* 8.9* 10.4*  HGB 9.9* 9.7* 9.2* 9.5* 9.8* 10.0*  HCT 31.5* 30.2* 28.2* 29.4* 30.4* 30.6*  MCV 91.8 91.8 88.4 89.4 88.9 88.4  PLT 210  208 245 269 307 [id number]   Basic Metabolic Panel: Recent Labs  Lab 06/17/19 0211 06/18/19 0303 06/19/19 0325 06/20/19 0430 06/21/19 0228 06/22/19 0433 06/23/19 0149  NA 134* 132* 133* 134* 133*  --  131*  K 4.2 4.0 4.4 4.2 4.5  --  4.3  CL 105 102 104 106 104  --  103  CO2 21* 23 23 23 23   --  20*  GLUCOSE 180* 136* 129* 111* 128*  --  107*  BUN 24* 30* 28* 30* 29*  --  28*  CREATININE 0.79 0.81 0.72 0.73 0.70  --  0.73  CALCIUM 9.1 8.9 9.0 8.8* 8.9  --  8.9  MG 1.8  --  2.0  --  1.8 1.8 1.9  PHOS 2.6 2.6 1.8*  --  2.1* 2.5 3.1   GFR: Estimated Creatinine Clearance: 68.7 mL/min (by C-G formula based on SCr of 0.73 mg/dL). Liver Function Tests: Recent Labs  Lab 06/18/19 0303 06/19/19 0325 06/20/19 0430 06/21/19 0228 06/23/19 0149  AST 19 15 18 21 19   ALT 14 11 14 17 19   ALKPHOS 45 40 42 45 47  BILITOT 0.6 0.9 0.6 0.6 0.9  PROT 5.1* 4.9* 4.6* 4.6* 4.9*  ALBUMIN 2.6* 2.8* 2.6* 2.6* 2.8*   No results for input(s): LIPASE, AMYLASE in the last 168 hours. No results for input(s): AMMONIA in the last 168 hours. Coagulation  Profile: No results for input(s): INR, PROTIME in the last 168 hours. Cardiac Enzymes: No results for input(s): CKTOTAL, CKMB, CKMBINDEX, TROPONINI in the last 168 hours. BNP (last 3 results) No results for input(s): PROBNP in the last 8760 hours. HbA1C: No results for input(s): HGBA1C in the last 72 hours. CBG: Recent Labs  Lab 06/22/19 1518 06/22/19 1954 06/23/19 0031 06/23/19 0301 06/23/19 0735  GLUCAP 158* 154* 108* 105* 97   Lipid Profile: No results for input(s): CHOL, HDL, LDLCALC, TRIG, CHOLHDL, LDLDIRECT in the last 72 hours. Thyroid Function Tests: No results for input(s): TSH, T4TOTAL, FREET4, T3FREE, THYROIDAB in the last 72 hours. Anemia Panel: Recent Labs    06/22/19 0433 06/23/19 0149  FERRITIN 184 179   Urine analysis:    Component Value Date/Time   COLORURINE YELLOW 05/04/2018 2140   APPEARANCEUR CLEAR 05/04/2018 2140   LABSPEC 1.028 05/04/2018 2140   PHURINE 6.0 05/04/2018 2140   GLUCOSEU NEGATIVE 05/04/2018 2140   HGBUR NEGATIVE 05/04/2018 2140   BILIRUBINUR NEGATIVE 05/04/2018 2140   KETONESUR NEGATIVE 05/04/2018 2140   PROTEINUR NEGATIVE 05/04/2018 2140   UROBILINOGEN 2.0 (H) 05/28/2015 0029   NITRITE NEGATIVE 05/04/2018 2140   LEUKOCYTESUR NEGATIVE 05/04/2018 2140   Sepsis Labs: @LABRCNTIP (procalcitonin:4,lacticidven:4)  ) Recent Results (from the past 240 hour(s))  SARS CORONAVIRUS 2 (TAT 6-24 HRS) Nasopharyngeal Nasopharyngeal Swab     Status: Abnormal   Collection Time: 06/15/19  8:46 PM   Specimen: Nasopharyngeal Swab  Result Value Ref Range Status   SARS Coronavirus 2 POSITIVE (A) NEGATIVE Final    Comment: RESULT CALLED TO, READ BACK BY AND VERIFIED WITH: Alanson Aly, RN AT [phone] ON 06/16/2019 BY SAINVILUS S (NOTE) SARS-CoV-2 target nucleic acids are DETECTED. The SARS-CoV-2 RNA is generally detectable in upper and lower respiratory specimens during the acute phase of infection. Positive results are indicative of active  infection with SARS-CoV-2. Clinical  correlation with patient history and other diagnostic information is necessary to determine patient infection status. Positive results do  not rule out bacterial infection or co-infection with other viruses. The expected result is  Negative. Fact Sheet for Patients: SugarRoll.be Fact Sheet for Healthcare Providers: https://www.Khandi Kernes-mathews.com/ This test is not yet approved or cleared by the [place] FDA and  has been authorized for detection and/or diagnosis of SARS-CoV-2 by FDA under an Emergency Use Authorization (EUA). This EUA will remain  in effect (meaning this test  can be used) for the duration of the COVID-19 declaration under Section 564(b)(1) of the Act, 21 U.S.C. section 360bbb-3(b)(1), unless the authorization is terminated or revoked sooner. Performed at [facility], [place] [address] [place], [place] [address]   Surgical pcr screen     Status: None   Collection Time: 06/16/19  1:35 AM   Specimen: Nasal Mucosa; Nasal Swab  Result Value Ref Range Status   MRSA, PCR NEGATIVE NEGATIVE Final   Staphylococcus aureus NEGATIVE NEGATIVE Final    Comment: (NOTE) The Xpert SA Assay (FDA approved for NASAL specimens in patients 35 years of age and older), is one component of a comprehensive surveillance program. It is not intended to diagnose infection nor to guide or monitor treatment. Performed at [facility], [place] [address], [place], [place] [address]          Radiology Studies: No results found.      Scheduled Meds: . carvedilol  12.5 mg Oral BID WC  . Chlorhexidine Gluconate Cloth  6 each Topical Daily  . dexamethasone (DECADRON) injection  6 mg Intravenous Q24H  . docusate sodium  100 mg Oral BID  . enoxaparin (LOVENOX) injection  40 mg Subcutaneous Q12H  . fenofibrate  160 mg Oral Daily  . ferrous sulfate  325 mg Oral Q breakfast  . insulin  aspart  0-9 Units Subcutaneous Q4H  . Ipratropium-Albuterol  1 puff Inhalation QID  . loratadine  10 mg Oral Daily  . losartan  50 mg Oral Daily  . mouth rinse  15 mL Mouth Rinse BID  . pantoprazole  40 mg Oral Daily  . polyvinyl alcohol  1-2 drop Both Eyes QHS  . QUEtiapine  25 mg Oral QHS  . simvastatin  40 mg Oral q1800  . sodium chloride  2 spray Each Nare BID  . sodium chloride flush  10-40 mL Intracatheter Q12H  . vitamin C  500 mg Oral Daily  . zinc sulfate  220 mg Oral Daily   Continuous Infusions: . methocarbamol (ROBAXIN) IV Stopped (06/16/19 0215)     LOS: 8 days   The patient is critically ill with multiple organ systems failure and requires high complexity decision making for assessment and support, frequent evaluation and titration of therapies, application of advanced monitoring technologies and extensive interpretation of multiple databases. Critical Care Time devoted to patient care services described in this note  Time spent: 40 minutes     Theodore Rahrig, Geraldo Docker, MD [facility] Pager [phone]  If 7PM-7AM, please contact night-coverage [email or web address] Password [facility] 06/23/2019, 10:55 AM

## 2019-06-23 NOTE — Progress Notes (Signed)
Jesse [name] had an episode at the beginning of the shift where he did not know who I was and was a bit confused but quickly reoriented x 4. Later around 0300 he became disoriented again and sat on the side of the bed unassisted. RN and tech helped him stand, change the bed linen, and assist back to bed. Pain meds given due to muscle spasm. Patient each time was reoriented quickly. Bed alarm activated and audible.

## 2019-06-24 DIAGNOSIS — F419 Anxiety disorder, unspecified: Secondary | ICD-10-CM

## 2019-06-24 DIAGNOSIS — R2689 Other abnormalities of gait and mobility: Secondary | ICD-10-CM | POA: Diagnosis not present

## 2019-06-24 DIAGNOSIS — S72142D Displaced intertrochanteric fracture of left femur, subsequent encounter for closed fracture with routine healing: Secondary | ICD-10-CM | POA: Diagnosis not present

## 2019-06-24 DIAGNOSIS — E7800 Pure hypercholesterolemia, unspecified: Secondary | ICD-10-CM

## 2019-06-24 DIAGNOSIS — S72341B Displaced spiral fracture of shaft of right femur, initial encounter for open fracture type I or II: Secondary | ICD-10-CM

## 2019-06-24 DIAGNOSIS — I10 Essential (primary) hypertension: Secondary | ICD-10-CM | POA: Diagnosis not present

## 2019-06-24 DIAGNOSIS — D638 Anemia in other chronic diseases classified elsewhere: Secondary | ICD-10-CM | POA: Diagnosis not present

## 2019-06-24 DIAGNOSIS — I25810 Atherosclerosis of coronary artery bypass graft(s) without angina pectoris: Secondary | ICD-10-CM | POA: Diagnosis not present

## 2019-06-24 DIAGNOSIS — Z743 Need for continuous supervision: Secondary | ICD-10-CM | POA: Diagnosis not present

## 2019-06-24 DIAGNOSIS — R2681 Unsteadiness on feet: Secondary | ICD-10-CM | POA: Diagnosis not present

## 2019-06-24 DIAGNOSIS — J1289 Other viral pneumonia: Secondary | ICD-10-CM

## 2019-06-24 DIAGNOSIS — R5381 Other malaise: Secondary | ICD-10-CM | POA: Diagnosis not present

## 2019-06-24 DIAGNOSIS — S72144D Nondisplaced intertrochanteric fracture of right femur, subsequent encounter for closed fracture with routine healing: Secondary | ICD-10-CM | POA: Diagnosis not present

## 2019-06-24 DIAGNOSIS — M6281 Muscle weakness (generalized): Secondary | ICD-10-CM | POA: Diagnosis not present

## 2019-06-24 DIAGNOSIS — I7789 Other specified disorders of arteries and arterioles: Secondary | ICD-10-CM | POA: Diagnosis not present

## 2019-06-24 DIAGNOSIS — S7291XA Unspecified fracture of right femur, initial encounter for closed fracture: Secondary | ICD-10-CM | POA: Diagnosis not present

## 2019-06-24 DIAGNOSIS — R41841 Cognitive communication deficit: Secondary | ICD-10-CM | POA: Diagnosis not present

## 2019-06-24 DIAGNOSIS — G478 Other sleep disorders: Secondary | ICD-10-CM | POA: Diagnosis not present

## 2019-06-24 DIAGNOSIS — U071 COVID-19: Secondary | ICD-10-CM | POA: Diagnosis not present

## 2019-06-24 DIAGNOSIS — I2510 Atherosclerotic heart disease of native coronary artery without angina pectoris: Secondary | ICD-10-CM

## 2019-06-24 DIAGNOSIS — I5022 Chronic systolic (congestive) heart failure: Secondary | ICD-10-CM | POA: Diagnosis not present

## 2019-06-24 DIAGNOSIS — F4322 Adjustment disorder with anxiety: Secondary | ICD-10-CM | POA: Diagnosis not present

## 2019-06-24 DIAGNOSIS — R278 Other lack of coordination: Secondary | ICD-10-CM | POA: Diagnosis not present

## 2019-06-24 DIAGNOSIS — R279 Unspecified lack of coordination: Secondary | ICD-10-CM | POA: Diagnosis not present

## 2019-06-24 LAB — CBC WITH DIFFERENTIAL/PLATELET
Abs Immature Granulocytes: 1.1 10*3/uL — ABNORMAL HIGH (ref 0.00–0.07)
Basophils Absolute: 0 10*3/uL (ref 0.0–0.1)
Basophils Relative: 0 %
Eosinophils Absolute: 0 10*3/uL (ref 0.0–0.5)
Eosinophils Relative: 0 %
HCT: 30.6 % — ABNORMAL LOW (ref 39.0–52.0)
Hemoglobin: 9.9 g/dL — ABNORMAL LOW (ref 13.0–17.0)
Lymphocytes Relative: 4 %
Lymphs Abs: 0.5 10*3/uL — ABNORMAL LOW (ref 0.7–4.0)
MCH: 29.3 pg (ref 26.0–34.0)
MCHC: 32.4 g/dL (ref 30.0–36.0)
MCV: 90.5 fL (ref 80.0–100.0)
Metamyelocytes Relative: 1 %
Monocytes Absolute: 0.7 10*3/uL (ref 0.1–1.0)
Monocytes Relative: 5 %
Myelocytes: 7 %
Neutro Abs: 11.4 10*3/uL — ABNORMAL HIGH (ref 1.7–7.7)
Neutrophils Relative %: 83 %
Platelets: 308 10*3/uL (ref 150–400)
RBC: 3.38 MIL/uL — ABNORMAL LOW (ref 4.22–5.81)
RDW: 14.3 % (ref 11.5–15.5)
WBC: 13.7 10*3/uL — ABNORMAL HIGH (ref 4.0–10.5)
nRBC: 0 % (ref 0.0–0.2)

## 2019-06-24 LAB — MAGNESIUM: Magnesium: 2.1 mg/dL (ref 1.7–2.4)

## 2019-06-24 LAB — COMPREHENSIVE METABOLIC PANEL
ALT: 19 U/L (ref 0–44)
AST: 19 U/L (ref 15–41)
Albumin: 2.9 g/dL — ABNORMAL LOW (ref 3.5–5.0)
Alkaline Phosphatase: 45 U/L (ref 38–126)
Anion gap: 7 (ref 5–15)
BUN: 40 mg/dL — ABNORMAL HIGH (ref 8–23)
CO2: 22 mmol/L (ref 22–32)
Calcium: 8.7 mg/dL — ABNORMAL LOW (ref 8.9–10.3)
Chloride: 102 mmol/L (ref 98–111)
Creatinine, Ser: 0.91 mg/dL (ref 0.61–1.24)
GFR calc Af Amer: >60 mL/min (ref >60)
GFR calc non Af Amer: >60 mL/min (ref >60)
Glucose, Bld: 87 mg/dL (ref 70–99)
Potassium: 4.3 mmol/L (ref 3.5–5.1)
Sodium: 131 mmol/L — ABNORMAL LOW (ref 135–145)
Total Bilirubin: 1.1 mg/dL (ref 0.3–1.2)
Total Protein: 4.7 g/dL — ABNORMAL LOW (ref 6.5–8.1)

## 2019-06-24 LAB — GLUCOSE, CAPILLARY
Glucose-Capillary: 119 mg/dL — ABNORMAL HIGH (ref 70–99)
Glucose-Capillary: 117 mg/dL — ABNORMAL HIGH (ref 70–99)
Glucose-Capillary: 101 mg/dL — ABNORMAL HIGH (ref 70–99)
Glucose-Capillary: 91 mg/dL (ref 70–99)

## 2019-06-24 LAB — FERRITIN: Ferritin: 166 ng/mL (ref 24–336)

## 2019-06-24 LAB — PHOSPHORUS: Phosphorus: 3.6 mg/dL (ref 2.5–4.6)

## 2019-06-24 LAB — C-REACTIVE PROTEIN: CRP: <0.8 mg/dL (ref <1.0)

## 2019-06-24 LAB — LACTATE DEHYDROGENASE: LDH: 181 U/L (ref 98–192)

## 2019-06-24 LAB — D-DIMER, QUANTITATIVE: D-Dimer, Quant: 6.42 ug/mL-FEU — ABNORMAL HIGH (ref 0.00–0.50)

## 2019-06-24 NOTE — Care Management Important Message (Signed)
Important Message  Patient Details  Name: Jesse [name] MRN: [id number] Date of Birth: [birth year]   Medicare Important Message Given:  Yes - Important Message mailed due to current National Emergency  Verbal consent obtained due to current National Emergency  Relationship to patient: Child Contact Name: Burrell Wies Call Date: 06/24/19  Time: [id number] Phone: [phone] Outcome: Spoke with contact Important Message mailed to: Patient address on file    Delorse Lek 06/24/2019, 9:53 AM

## 2019-06-24 NOTE — TOC Transition Note (Signed)
Transition of Care [facility]) - CM/SW Discharge Note   Patient Details  Name: Jesse [name] MRN: [id number] Date of Birth: [birth year]  [facility] CM/SW Contact:  Carles Collet, RN Phone Number: 06/24/2019, 10:04 AM   Clinical Narrative:   Jesse [name] w patient's son. Informed of bed offer. He is agreeable to have patient go to [facility] via [facility] today. Offered to call back and let him know when patient leaves [facility], he declined.  Spoke w Jesse [name] who received auth from [facility], bed 703 available today. Provided bedside RN with phone number to call report. PTAR forms updated for [facility] to print out. Please also send with Gold DNR form.     Final next level of care: Skilled Nursing Facility Barriers to Discharge: Insurance Authorization   Patient Goals and CMS Choice Patient states their goals for this hospitalization and ongoing recovery are:: get better CMS Medicare.gov Compare Post Acute Care list provided to:: Patient Represenative (must comment)(son : Jesse [name]) Choice offered to / list presented to : Adult Children  Discharge Placement                       Discharge Plan and Services   Discharge Planning Services: CM Consult Post Acute Care Choice: NA          DME Arranged: N/A DME Agency: NA       HH Arranged: NA          Social Determinants of Health (SDOH) Interventions     Readmission Risk Interventions No flowsheet data found.

## 2019-06-24 NOTE — Discharge Summary (Signed)
Physician Discharge Summary  Jesse [name] [id number] DOB: Jun 21, 1928 DOA: 06/15/2019  PCP: [name], MD  Admit date: 06/15/2019 Discharge date: 06/24/2019  Admitted From: Home Disposition: SNF   Recommendations for Outpatient Follow-up:  1. Follow up with PCP in 1-2 weeks 2. Follow up with orthopedics  Home Health: N/A Equipment/Devices: Per SNF Discharge Condition: Stable CODE STATUS: DNR Diet recommendation: Heart healthy  Brief/Interim Summary: Jesse [name] is an 85 y.o. male with a history of arthritis, prostate cancer, AAA, CAD status post CABG, chronic systolic CHF, essential HTN, HLD, RBBB, GERD, and recent IM fixation of left femur s/p fall at home who presented after a fall at home onto the right hip with ensuing pain. Patient found to have acute intertrochanteric nondisplaced proximal right femur fracture.  Patient reported having some URI symptoms (sneezing) prior to admission but his vitals were stable and maintaining sats on room air.  Chest x-ray without acute findings.  He tested positive for COVID-19 and was given 5 days of remdesivir due to elevated inflammatory markers, though didn't have severe respiratory distress. Patient taken to the OR on 11/23 and underwent intramedullary fixation of the right femur.  Tolerated surgery well, subsequently transferred to [facility]. PT has recommended SNF at discharge.  Discharge Diagnoses:  Principal Problem:   COVID-19 virus infection Active Problems:   HLD (hyperlipidemia)   Essential hypertension   CAD, ARTERY BYPASS GRAFT   Coronary artery disease involving native coronary artery of native heart without angina pectoris   Chronic systolic heart failure (HCC)   Hip fracture (HCC)   Anemia due to chronic illness   Pneumonia due to COVID-19 virus   Displaced spiral fracture of shaft of right femur, initial encounter for open fracture type I or II ([place])   Anxiety  Right intertrochanteric femur fracture, nondisplaced s/p IM  fixation of the right femur on 11/23 by Dr. Lyla Glassing: - WBAT with walker per orthopedics - PO pain control per orthopedics - Will need follow up with orthopedics, Dr. Lyla Glassing in 2 weeks.  - DVT prophylaxis with subcu Lovenox x30 days per orthopedics, also has elevated risk of DVT with covid as well.  COVID-19 infection: Has completed 5 days remdesivir, 9 days steroids without hypoxia. Inflammatory markers responded well to therapy.  - Continue isolation per protocol. Had positive test on 11/21.  CAD with history of CABG Stable.  Continue Coreg, losartan and statin  HLD:  - Continue fenofibrate, statin.  Essential hypertension Stable on home meds  GERD Continue PPI  Normocytic/postoperative blood loss anemia Stable.  Hyperglycemia Mild secondary to steroid use.  Monitor for now.  Discharge Instructions  Allergies as of 06/24/2019      Reactions   Quinolones Other (See Comments)   unknown   Adhesive [tape] Itching, Rash   Lactose Intolerance (gi) Other (See Comments)   Gas, bloating, stomach indigestion       Medication List    STOP taking these medications   amoxicillin-clavulanate 875-125 MG tablet Commonly known as: AUGMENTIN     TAKE these medications   acetaminophen 500 MG tablet Commonly known as: TYLENOL Take 500 mg by mouth every 6 (six) hours as needed for mild pain or headache.   aspirin 81 MG tablet Take 81 mg by mouth daily.   B-12 PO Take 1 tablet by mouth daily.   carboxymethylcellulose 0.5 % Soln Commonly known as: REFRESH PLUS Apply 1-2 drops to eye at bedtime.   carvedilol 12.5 MG tablet Commonly known as: COREG TAKE  1 TABLET BY MOUTH 2 TIMES DAILY WITH A MEAL What changed:   how much to take  how to take this  when to take this  additional instructions   docusate sodium 100 MG capsule Commonly known as: COLACE Take 2 capsules (200 mg total) by mouth 2 (two) times daily.   enoxaparin 40 MG/0.4ML injection Commonly  known as: LOVENOX Inject 0.4 mLs (40 mg total) into the skin daily.   fenofibrate 160 MG tablet Take 1 tablet (160 mg total) by mouth daily.   ferrous sulfate 325 (65 FE) MG tablet Take 325 mg by mouth daily with breakfast.   HYDROcodone-acetaminophen 5-325 MG tablet Commonly known as: NORCO/VICODIN Take 1 tablet by mouth every 4 (four) hours as needed for moderate pain.   hydrocortisone 2.5 % cream Apply 1 application topically as needed (for dry ears).   loratadine 10 MG tablet Commonly known as: CLARITIN Take 10 mg by mouth daily.   losartan 50 MG tablet Commonly known as: COZAAR Take 50 mg by mouth daily.   pantoprazole 40 MG tablet Commonly known as: PROTONIX Take 40 mg by mouth daily.   simvastatin 40 MG tablet Commonly known as: ZOCOR Take 1 tablet (40 mg total) by mouth daily at 6 PM.   sodium chloride 0.65 % Soln nasal spray Commonly known as: OCEAN Place 2 sprays into both nostrils 2 (two) times daily.       Contact information for follow-up providers    Swinteck, Aaron Edelman, MD. Schedule an appointment as soon as possible for a visit in 2 weeks.   Specialty: Orthopedic Surgery Why: For suture removal, For wound re-check Contact information: [address] [place] [address] [id number]            Contact information for after-discharge care    Destination    [facility] .   Service: Skilled Nursing Contact information: [place] [address] [phone]                 Allergies  Allergen Reactions  . Quinolones Other (See Comments)    unknown  . Adhesive [Tape] Itching and Rash  . Lactose Intolerance (Gi) Other (See Comments)    Gas, bloating, stomach indigestion     Consultations:  Orthopedics, Dr. Lyla Glassing  Procedures/Studies: Xr Chest Preop 1 View  Result Date: 06/15/2019 CLINICAL DATA:  Right hip fracture EXAM: CHEST  1 VIEW COMPARISON:  04/15/2019 chest  radiograph. FINDINGS: Intact sternotomy wires. CABG clips overlie the mediastinum. Stable cardiomediastinal silhouette with mild cardiomegaly. No pneumothorax. No pleural effusion. No overt pulmonary edema. Mild left basilar scarring, stable. No acute consolidative airspace disease. IMPRESSION: 1. Stable mild cardiomegaly without overt pulmonary edema. 2. Stable mild left basilar scarring. No acute consolidative airspace disease. Electronically Signed   By: Ilona Sorrel M.D.   On: 06/15/2019 20:19   Dg Chest Port 1 View  Result Date: 06/17/2019 CLINICAL DATA:  Shortness of breath. EXAM: PORTABLE CHEST 1 VIEW COMPARISON:  June 15, 2019. FINDINGS: Stable cardiomegaly. No pneumothorax or pleural effusion is noted. Sternotomy wires are noted. No acute pulmonary disease is noted. Bony thorax is unremarkable. IMPRESSION: No active disease. Electronically Signed   By: Marijo Conception M.D.   On: 06/17/2019 07:32   Dg Knee Right Port  Result Date: 06/15/2019 CLINICAL DATA:  Initial evaluation for acute trauma, fall. Knee pain. EXAM: PORTABLE RIGHT KNEE - 1-2 VIEW COMPARISON:  Prior radiograph from 04/15/2019 FINDINGS: Two cannulated lack  fixation screws with cerclage wires again seen traversing the patella. Appearance is stable without hardware complication. No acute fracture or dislocation. No joint effusion. Mild osteoarthritic changes involving the patellofemoral articulation again noted. No visible acute soft tissue injury. Extensive atherosclerotic change noted about the knee with diffusely ectatic versus aneurysm involving the distal femoral and/or popliteal arteries. Osteopenia. IMPRESSION: 1. No acute osseous abnormality. 2. Sequelae of prior ORIF at the patella without hardware complication. 3. Aortic Atherosclerosis (ICD10-I70.0). Diffusely ectatic versus aneurysmal dilatation of the distal femoral and/or popliteal arteries. Finding could be further assessed with dedicated cross-sectional imaging as  clinically warranted. Electronically Signed   By: Jeannine Boga M.D.   On: 06/15/2019 22:58   Dg C-arm 1-60 Min-no Report  Result Date: 06/17/2019 Fluoroscopy was utilized by the requesting physician.  No radiographic interpretation.   Dg Hip Operative Unilat W Or W/o Pelvis Right  Result Date: 06/17/2019 CLINICAL DATA:  Status post surgical internal fixation of right proximal femur fracture. EXAM: OPERATIVE right HIP (WITH PELVIS IF PERFORMED) 3 VIEWS TECHNIQUE: Fluoroscopic spot image(s) were submitted for interpretation post-operatively. FLUOROSCOPY TIME:  1 minutes 23 seconds. COMPARISON:  June 15, 2019. FINDINGS: Three intraoperative fluoroscopic images were obtained of the right hip. These demonstrate surgical internal fixation of proximal right femoral intertrochanteric fracture. IMPRESSION: Status post surgical internal fixation of proximal right femoral intertrochanteric fracture. Electronically Signed   By: Marijo Conception M.D.   On: 06/17/2019 14:17   Xr Hip Right  Result Date: 06/15/2019 CLINICAL DATA:  Fall with right hip and pelvic pain EXAM: DG HIP (WITH OR WITHOUT PELVIS) 2-3V RIGHT COMPARISON:  04/15/2019 pelvic and left hip radiographs FINDINGS: Acute intertrochanteric proximal right femur fracture without significant displacement. Fixation hardware partially visualized in the proximal left femur. No pelvic diastasis. Surgical clips overlie bilateral inguinal regions and sacrum. No suspicious focal osseous lesions. Degenerative changes in the visualized lower lumbar spine. Mild bilateral hip osteoarthritis. No hip dislocation. IMPRESSION: 1. Acute intertrochanteric proximal right femur fracture without significant displacement. No right hip dislocation. 2. Mild bilateral hip osteoarthritis. Electronically Signed   By: Ilona Sorrel M.D.   On: 06/15/2019 19:56     06/17/19 RIGHT INTRAMEDULLARY (IM) NAIL INTERTROCHANTRIC Swinteck, Aaron Edelman, MD   Subjective: Pain is  controlled, no shortness of breath, having BMs.   Discharge Exam: Vitals:   06/24/19 0337 06/24/19 0713  BP: 116/68 123/85  Pulse: (!) 59 63  Resp: 18 20  Temp: (!) 97.5 F (36.4 C) (!) 97.4 F (36.3 C)  SpO2: 95% 98%   General: Pt is alert, awake, not in acute distress Cardiovascular: RRR, S1/S2 +, no rubs, no gallops Respiratory: CTA bilaterally, no wheezing, no rhonchi Abdominal: Soft, NT, ND, bowel sounds + Extremities: No edema, no cyanosis. RLE w/c/d/i dressings on lateral thigh with surrounding ecchymoses dependently. Distal leg is warm, dry, full AROM and intact sensation  Labs: BNP (last 3 results) No results for input(s): BNP in the last 8760 hours. Basic Metabolic Panel: Recent Labs  Lab 06/19/19 0325 06/20/19 0430 06/21/19 0228 06/22/19 0433 06/23/19 0149 06/24/19 0059  NA 133* 134* 133*  --  131* 131*  K 4.4 4.2 4.5  --  4.3 4.3  CL 104 106 104  --  103 102  CO2 23 23 23   --  20* 22  GLUCOSE 129* 111* 128*  --  107* 87  BUN 28* 30* 29*  --  28* 40*  CREATININE 0.72 0.73 0.70  --  0.73 0.91  CALCIUM  9.0 8.8* 8.9  --  8.9 8.7*  MG 2.0  --  1.8 1.8 1.9 2.1  PHOS 1.8*  --  2.1* 2.5 3.1 3.6   Liver Function Tests: Recent Labs  Lab 06/19/19 0325 06/20/19 0430 06/21/19 0228 06/23/19 0149 06/24/19 0059  AST 15 18 21 19 19   ALT 11 14 17 19 19   ALKPHOS 40 42 45 47 45  BILITOT 0.9 0.6 0.6 0.9 1.1  PROT 4.9* 4.6* 4.6* 4.9* 4.7*  ALBUMIN 2.8* 2.6* 2.6* 2.8* 2.9*   No results for input(s): LIPASE, AMYLASE in the last 168 hours. No results for input(s): AMMONIA in the last 168 hours. CBC: Recent Labs  Lab 06/19/19 0325 06/20/19 0430 06/21/19 0228 06/22/19 0433 06/23/19 0149 06/24/19 0059  WBC 7.9 7.7 10.0 11.7* 13.6* 13.7*  NEUTROABS 7.1  --  8.3* 8.9* 10.4* 11.4*  HGB 9.7* 9.2* 9.5* 9.8* 10.0* 9.9*  HCT 30.2* 28.2* 29.4* 30.4* 30.6* 30.6*  MCV 91.8 88.4 89.4 88.9 88.4 90.5  PLT 208 245 269 307 323 308   Cardiac Enzymes: No results for  input(s): CKTOTAL, CKMB, CKMBINDEX, TROPONINI in the last 168 hours. BNP: Invalid input(s): POCBNP CBG: Recent Labs  Lab 06/23/19 1632 06/23/19 1947 06/24/19 0025 06/24/19 0434 06/24/19 0712  GLUCAP 125* 156* 119* 117* 101*   D-Dimer Recent Labs    06/23/19 0149 06/24/19 0059  DDIMER 3.94* 6.42*   Hgb A1c No results for input(s): HGBA1C in the last 72 hours. Lipid Profile No results for input(s): CHOL, HDL, LDLCALC, TRIG, CHOLHDL, LDLDIRECT in the last 72 hours. Thyroid function studies No results for input(s): TSH, T4TOTAL, T3FREE, THYROIDAB in the last 72 hours.  Invalid input(s): FREET3 Anemia work up Recent Labs    06/23/19 0149 06/24/19 0059  FERRITIN 179 166   Urinalysis    Component Value Date/Time   COLORURINE YELLOW 05/04/2018 2140   APPEARANCEUR CLEAR 05/04/2018 2140   LABSPEC 1.028 05/04/2018 2140   PHURINE 6.0 05/04/2018 2140   [place] 05/04/2018 2140   [place] NEGATIVE 05/04/2018 2140   [place] NEGATIVE 05/04/2018 2140   KETONESUR NEGATIVE 05/04/2018 2140   PROTEINUR NEGATIVE 05/04/2018 2140   UROBILINOGEN 2.0 (H) 05/28/2015 0029   NITRITE NEGATIVE 05/04/2018 2140   LEUKOCYTESUR NEGATIVE 05/04/2018 2140    Microbiology Recent Results (from the past 240 hour(s))  SARS CORONAVIRUS 2 (TAT 6-24 HRS) Nasopharyngeal Nasopharyngeal Swab     Status: Abnormal   Collection Time: 06/15/19  8:46 PM   Specimen: Nasopharyngeal Swab  Result Value Ref Range Status   SARS Coronavirus 2 POSITIVE (A) NEGATIVE Final    Comment: RESULT CALLED TO, READ BACK BY AND VERIFIED WITH: Alanson Aly, RN AT [phone] ON 06/16/2019 BY SAINVILUS S (NOTE) SARS-CoV-2 target nucleic acids are DETECTED. The SARS-CoV-2 RNA is generally detectable in upper and lower respiratory specimens during the acute phase of infection. Positive results are indicative of active infection with SARS-CoV-2. Clinical  correlation with patient history and other diagnostic information  is necessary to determine patient infection status. Positive results do  not rule out bacterial infection or co-infection with other viruses. The expected result is Negative. Fact Sheet for Patients: SugarRoll.be Fact Sheet for Healthcare Providers: https://www.woods-mathews.com/ This test is not yet approved or cleared by the [place] FDA and  has been authorized for detection and/or diagnosis of SARS-CoV-2 by FDA under an Emergency Use Authorization (EUA). This EUA will remain  in effect (meaning this test  can be used) for the duration of the COVID-19  declaration under Section 564(b)(1) of the Act, 21 U.S.C. section 360bbb-3(b)(1), unless the authorization is terminated or revoked sooner. Performed at [facility], [place] [address], [place], [place] [address]   Surgical pcr screen     Status: None   Collection Time: 06/16/19  1:35 AM   Specimen: Nasal Mucosa; Nasal Swab  Result Value Ref Range Status   MRSA, PCR NEGATIVE NEGATIVE Final   Staphylococcus aureus NEGATIVE NEGATIVE Final    Comment: (NOTE) The Xpert SA Assay (FDA approved for NASAL specimens in patients 86 years of age and older), is one component of a comprehensive surveillance program. It is not intended to diagnose infection nor to guide or monitor treatment. Performed at [facility] [place] [address] [place], [place] [address]     Time coordinating discharge: Approximately 40 minutes  Jesse Pour, MD  [facility] 06/24/2019, 11:30 AM

## 2019-06-24 NOTE — Progress Notes (Signed)
Physical Therapy Treatment Patient Details Name: Jesse [name] MRN: [id number] DOB: [birth year] Today's Date: 06/24/2019    History of Present Illness Pt is 85 yo male admitted s/p fall iwth R intertrochanteric femur fraction and s/p intramedullary fixation on 06/17/19 with WBAT status.  Additionally, pt found to be COVID 19 positive.  Pt with  medical history significant of AAA, arthritis, history of prostate cancer, CAD status post CABG, chronic systolic congestive heart failure, GERD, hypertension, hyperlipidemia, RBBB.  Additionally pt with L IM nail 04/16/19 and ORIF patella 05/2015.    PT Comments    The patient did mobilize  From bed to recliner, reports increased o=pain with WB on the right leg. Patient again expresses his concern for getting through this illness and injury.    Follow Up Recommendations  SNF     Equipment Recommendations  None recommended by PT    Recommendations for Other Services       Precautions / Restrictions Precautions Precaution Comments: h/o falls Restrictions RLE Weight Bearing: Weight bearing as tolerated    Mobility  Bed Mobility Overal bed mobility: Needs Assistance Bed Mobility: Supine to Sit     Supine to sit: Min assist;HOB elevated     General bed mobility comments: Min assist to help progress R leg over EOB, support trunk as he scooted out.  HOB raised ~30 degrees.   Transfers Overall transfer level: Needs assistance Equipment used: Rolling walker (2 wheeled) Transfers: Sit to/from [facility] Sit to Stand: Min assist Stand pivot transfers: Min assist       General transfer comment: patient stood and able to take several steps to recliner using RW. Stood at [employer] x 30secs to try increased WB on right leg.  Ambulation/Gait                 Stairs             Wheelchair Mobility    Modified Rankin (Stroke Patients Only)       Balance Overall balance assessment: Needs  assistance Sitting-balance support: Feet supported;Bilateral upper extremity supported Sitting balance-Leahy Scale: Good     Standing balance support: Bilateral upper extremity supported Standing balance-Leahy Scale: Poor Standing balance comment: needs external support from RW and therapist.                            Cognition Arousal/Alertness: Awake/alert Behavior During Therapy: Anxious Overall Cognitive Status: No family/caregiver present to determine baseline cognitive functioning                                 General Comments: Pt is likely close to baseline, talks about how difficult it is to go through this again (i.e. breaking a leg) and how mentally defeating it is      Exercises General Exercises - Lower Extremity Heel Slides: AROM;Both;10 reps;Seated    General Comments        Pertinent Vitals/Pain Pain Assessment: Faces Faces Pain Scale: Hurts little more Pain Location: R hip Pain Descriptors / Indicators: Aching;Discomfort;Grimacing Pain Intervention(s): Monitored during session;Premedicated before session;Repositioned    Home Living                      Prior Function            PT Goals (current goals can now be found in the care plan section) Progress  towards PT goals: Progressing toward goals    Frequency    Min 2X/week      PT Plan Frequency needs to be updated    Co-evaluation              AM-PAC PT "6 Clicks" Mobility   Outcome Measure  Help needed turning from your back to your side while in a flat bed without using bedrails?: A Little Help needed moving from lying on your back to sitting on the side of a flat bed without using bedrails?: A Little Help needed moving to and from a bed to a chair (including a wheelchair)?: A Lot Help needed standing up from a chair using your arms (e.g., wheelchair or bedside chair)?: A Lot Help needed to walk in hospital room?: A Lot Help needed climbing 3-5  steps with a railing? : Total 6 Click Score: 13    End of Session Equipment Utilized During Treatment: Gait belt Activity Tolerance: Patient limited by fatigue Patient left: in chair;with call bell/phone within reach;with chair alarm set Nurse Communication: Mobility status;Other (comment) PT Visit Diagnosis: Other abnormalities of gait and mobility (R26.89);Repeated falls (R29.6);Muscle weakness (generalized) (M62.81)     Time: [id number] PT Time Calculation (min) (ACUTE ONLY): 30 min  Charges:  $Therapeutic Activity: 23-37 mins                     [name] PT Acute Rehabilitation Services Pager [phone] Office [phone]    [name] 06/24/2019, 12:57 PM

## 2019-06-24 NOTE — Progress Notes (Signed)
Patient requested to stand up due to leg spasms. With assistance of RN, walker, and gait belt, patient stood up without ambulating for total of 15 mins two times. Patient's heart rate noted to drop to 28 then increase to 130s with this activity but when at rest returned to rate of 60s. Patient complained of dizziness when HR got above 118. The second time the heart rate did not drop but did increase to 136 and complained of dizziness again. Patient denied any other symptoms. Will continue to monitor.

## 2019-06-24 NOTE — Care Management (Signed)
Sent secure chat to PT requesting updated note to send for insurance auth, as needed for DC to SNF.

## 2019-06-25 DIAGNOSIS — I7789 Other specified disorders of arteries and arterioles: Secondary | ICD-10-CM | POA: Diagnosis not present

## 2019-06-25 DIAGNOSIS — U071 COVID-19: Secondary | ICD-10-CM | POA: Diagnosis not present

## 2019-06-25 DIAGNOSIS — I25810 Atherosclerosis of coronary artery bypass graft(s) without angina pectoris: Secondary | ICD-10-CM | POA: Diagnosis not present

## 2019-06-25 DIAGNOSIS — I10 Essential (primary) hypertension: Secondary | ICD-10-CM | POA: Diagnosis not present

## 2019-07-02 DIAGNOSIS — I10 Essential (primary) hypertension: Secondary | ICD-10-CM | POA: Diagnosis not present

## 2019-07-02 DIAGNOSIS — U071 COVID-19: Secondary | ICD-10-CM | POA: Diagnosis not present

## 2019-07-02 DIAGNOSIS — G478 Other sleep disorders: Secondary | ICD-10-CM | POA: Diagnosis not present

## 2019-07-02 DIAGNOSIS — F4322 Adjustment disorder with anxiety: Secondary | ICD-10-CM | POA: Diagnosis not present

## 2019-07-09 DIAGNOSIS — S72144D Nondisplaced intertrochanteric fracture of right femur, subsequent encounter for closed fracture with routine healing: Secondary | ICD-10-CM | POA: Diagnosis not present

## 2019-07-09 DIAGNOSIS — S72142D Displaced intertrochanteric fracture of left femur, subsequent encounter for closed fracture with routine healing: Secondary | ICD-10-CM | POA: Diagnosis not present

## 2019-07-12 ENCOUNTER — Other Ambulatory Visit: Payer: Self-pay | Admitting: Family Medicine

## 2019-07-12 DIAGNOSIS — M810 Age-related osteoporosis without current pathological fracture: Secondary | ICD-10-CM

## 2019-07-16 DIAGNOSIS — S7291XA Unspecified fracture of right femur, initial encounter for closed fracture: Secondary | ICD-10-CM | POA: Diagnosis not present

## 2019-07-16 DIAGNOSIS — I25810 Atherosclerosis of coronary artery bypass graft(s) without angina pectoris: Secondary | ICD-10-CM | POA: Diagnosis not present

## 2019-07-16 DIAGNOSIS — I10 Essential (primary) hypertension: Secondary | ICD-10-CM | POA: Diagnosis not present

## 2019-07-16 DIAGNOSIS — U071 COVID-19: Secondary | ICD-10-CM | POA: Diagnosis not present

## 2019-07-25 DIAGNOSIS — I10 Essential (primary) hypertension: Secondary | ICD-10-CM | POA: Diagnosis not present

## 2019-07-25 DIAGNOSIS — U071 COVID-19: Secondary | ICD-10-CM | POA: Diagnosis not present

## 2019-07-25 DIAGNOSIS — G478 Other sleep disorders: Secondary | ICD-10-CM | POA: Diagnosis not present

## 2019-08-06 DIAGNOSIS — S72144D Nondisplaced intertrochanteric fracture of right femur, subsequent encounter for closed fracture with routine healing: Secondary | ICD-10-CM | POA: Diagnosis not present

## 2019-08-07 DIAGNOSIS — I2510 Atherosclerotic heart disease of native coronary artery without angina pectoris: Secondary | ICD-10-CM | POA: Diagnosis not present

## 2019-08-07 DIAGNOSIS — M8000XD Age-related osteoporosis with current pathological fracture, unspecified site, subsequent encounter for fracture with routine healing: Secondary | ICD-10-CM | POA: Diagnosis not present

## 2019-08-07 DIAGNOSIS — S72001S Fracture of unspecified part of neck of right femur, sequela: Secondary | ICD-10-CM | POA: Diagnosis not present

## 2019-08-07 DIAGNOSIS — U071 COVID-19: Secondary | ICD-10-CM | POA: Diagnosis not present

## 2019-08-13 DIAGNOSIS — R296 Repeated falls: Secondary | ICD-10-CM | POA: Diagnosis not present

## 2019-08-13 DIAGNOSIS — R2689 Other abnormalities of gait and mobility: Secondary | ICD-10-CM | POA: Diagnosis not present

## 2019-08-13 DIAGNOSIS — M6281 Muscle weakness (generalized): Secondary | ICD-10-CM | POA: Diagnosis not present

## 2019-08-15 ENCOUNTER — Encounter (HOSPITAL_COMMUNITY): Payer: Self-pay | Admitting: Emergency Medicine

## 2019-08-15 ENCOUNTER — Inpatient Hospital Stay (HOSPITAL_COMMUNITY)
Admission: EM | Admit: 2019-08-15 | Discharge: 2019-08-21 | DRG: 666 | Disposition: A | Discharge status: Home Health | Payer: Medicare Other | Source: Skilled Nursing Facility | Attending: Internal Medicine | Admitting: Internal Medicine

## 2019-08-15 ENCOUNTER — Emergency Department (HOSPITAL_COMMUNITY): Payer: Medicare Other

## 2019-08-15 DIAGNOSIS — E785 Hyperlipidemia, unspecified: Secondary | ICD-10-CM | POA: Diagnosis not present

## 2019-08-15 DIAGNOSIS — R319 Hematuria, unspecified: Secondary | ICD-10-CM

## 2019-08-15 DIAGNOSIS — Z8616 Personal history of COVID-19: Secondary | ICD-10-CM

## 2019-08-15 DIAGNOSIS — Z809 Family history of malignant neoplasm, unspecified: Secondary | ICD-10-CM | POA: Diagnosis not present

## 2019-08-15 DIAGNOSIS — N281 Cyst of kidney, acquired: Secondary | ICD-10-CM | POA: Diagnosis not present

## 2019-08-15 DIAGNOSIS — K8020 Calculus of gallbladder without cholecystitis without obstruction: Secondary | ICD-10-CM | POA: Diagnosis not present

## 2019-08-15 DIAGNOSIS — J1282 Pneumonia due to coronavirus disease 2019: Secondary | ICD-10-CM | POA: Diagnosis not present

## 2019-08-15 DIAGNOSIS — I5022 Chronic systolic (congestive) heart failure: Secondary | ICD-10-CM | POA: Diagnosis present

## 2019-08-15 DIAGNOSIS — Z87891 Personal history of nicotine dependence: Secondary | ICD-10-CM | POA: Diagnosis not present

## 2019-08-15 DIAGNOSIS — I5042 Chronic combined systolic (congestive) and diastolic (congestive) heart failure: Secondary | ICD-10-CM | POA: Diagnosis present

## 2019-08-15 DIAGNOSIS — I2510 Atherosclerotic heart disease of native coronary artery without angina pectoris: Secondary | ICD-10-CM | POA: Diagnosis not present

## 2019-08-15 DIAGNOSIS — D62 Acute posthemorrhagic anemia: Secondary | ICD-10-CM | POA: Diagnosis present

## 2019-08-15 DIAGNOSIS — I739 Peripheral vascular disease, unspecified: Secondary | ICD-10-CM | POA: Diagnosis present

## 2019-08-15 DIAGNOSIS — I252 Old myocardial infarction: Secondary | ICD-10-CM | POA: Diagnosis not present

## 2019-08-15 DIAGNOSIS — I10 Essential (primary) hypertension: Secondary | ICD-10-CM | POA: Diagnosis not present

## 2019-08-15 DIAGNOSIS — Z888 Allergy status to other drugs, medicaments and biological substances status: Secondary | ICD-10-CM

## 2019-08-15 DIAGNOSIS — N3041 Irradiation cystitis with hematuria: Principal | ICD-10-CM | POA: Diagnosis present

## 2019-08-15 DIAGNOSIS — Z7983 Long term (current) use of bisphosphonates: Secondary | ICD-10-CM

## 2019-08-15 DIAGNOSIS — R52 Pain, unspecified: Secondary | ICD-10-CM | POA: Diagnosis not present

## 2019-08-15 DIAGNOSIS — Z91048 Other nonmedicinal substance allergy status: Secondary | ICD-10-CM

## 2019-08-15 DIAGNOSIS — Z833 Family history of diabetes mellitus: Secondary | ICD-10-CM | POA: Diagnosis not present

## 2019-08-15 DIAGNOSIS — Z7901 Long term (current) use of anticoagulants: Secondary | ICD-10-CM

## 2019-08-15 DIAGNOSIS — R310 Gross hematuria: Secondary | ICD-10-CM

## 2019-08-15 DIAGNOSIS — Z923 Personal history of irradiation: Secondary | ICD-10-CM

## 2019-08-15 DIAGNOSIS — I951 Orthostatic hypotension: Secondary | ICD-10-CM | POA: Diagnosis not present

## 2019-08-15 DIAGNOSIS — U071 COVID-19: Secondary | ICD-10-CM | POA: Diagnosis present

## 2019-08-15 DIAGNOSIS — E739 Lactose intolerance, unspecified: Secondary | ICD-10-CM | POA: Diagnosis present

## 2019-08-15 DIAGNOSIS — H9190 Unspecified hearing loss, unspecified ear: Secondary | ICD-10-CM | POA: Diagnosis not present

## 2019-08-15 DIAGNOSIS — Z974 Presence of external hearing-aid: Secondary | ICD-10-CM

## 2019-08-15 DIAGNOSIS — Z66 Do not resuscitate: Secondary | ICD-10-CM | POA: Diagnosis not present

## 2019-08-15 DIAGNOSIS — R2689 Other abnormalities of gait and mobility: Secondary | ICD-10-CM | POA: Diagnosis not present

## 2019-08-15 DIAGNOSIS — N3289 Other specified disorders of bladder: Secondary | ICD-10-CM | POA: Diagnosis not present

## 2019-08-15 DIAGNOSIS — I110 Hypertensive heart disease with heart failure: Secondary | ICD-10-CM | POA: Diagnosis present

## 2019-08-15 DIAGNOSIS — R278 Other lack of coordination: Secondary | ICD-10-CM | POA: Diagnosis not present

## 2019-08-15 DIAGNOSIS — R2681 Unsteadiness on feet: Secondary | ICD-10-CM | POA: Diagnosis not present

## 2019-08-15 DIAGNOSIS — Z951 Presence of aortocoronary bypass graft: Secondary | ICD-10-CM

## 2019-08-15 DIAGNOSIS — Z7982 Long term (current) use of aspirin: Secondary | ICD-10-CM

## 2019-08-15 DIAGNOSIS — I4510 Unspecified right bundle-branch block: Secondary | ICD-10-CM | POA: Diagnosis present

## 2019-08-15 DIAGNOSIS — K219 Gastro-esophageal reflux disease without esophagitis: Secondary | ICD-10-CM | POA: Diagnosis not present

## 2019-08-15 DIAGNOSIS — M6281 Muscle weakness (generalized): Secondary | ICD-10-CM | POA: Diagnosis not present

## 2019-08-15 DIAGNOSIS — B952 Enterococcus as the cause of diseases classified elsewhere: Secondary | ICD-10-CM | POA: Diagnosis present

## 2019-08-15 DIAGNOSIS — Z8679 Personal history of other diseases of the circulatory system: Secondary | ICD-10-CM

## 2019-08-15 DIAGNOSIS — Z8249 Family history of ischemic heart disease and other diseases of the circulatory system: Secondary | ICD-10-CM | POA: Diagnosis not present

## 2019-08-15 DIAGNOSIS — N200 Calculus of kidney: Secondary | ICD-10-CM | POA: Diagnosis not present

## 2019-08-15 DIAGNOSIS — Z8546 Personal history of malignant neoplasm of prostate: Secondary | ICD-10-CM

## 2019-08-15 DIAGNOSIS — M1990 Unspecified osteoarthritis, unspecified site: Secondary | ICD-10-CM | POA: Diagnosis not present

## 2019-08-15 DIAGNOSIS — F1021 Alcohol dependence, in remission: Secondary | ICD-10-CM | POA: Diagnosis present

## 2019-08-15 LAB — URINALYSIS, ROUTINE W REFLEX MICROSCOPIC

## 2019-08-15 LAB — BASIC METABOLIC PANEL
Anion gap: 12 (ref 5–15)
BUN: 25 mg/dL — ABNORMAL HIGH (ref 8–23)
CO2: 20 mmol/L — ABNORMAL LOW (ref 22–32)
Calcium: 10.4 mg/dL — ABNORMAL HIGH (ref 8.9–10.3)
Chloride: 104 mmol/L (ref 98–111)
Creatinine, Ser: 1.1 mg/dL (ref 0.61–1.24)
GFR calc Af Amer: >60 mL/min (ref >60)
GFR calc non Af Amer: 59 mL/min — ABNORMAL LOW (ref >60)
Glucose, Bld: 125 mg/dL — ABNORMAL HIGH (ref 70–99)
Potassium: 4.4 mmol/L (ref 3.5–5.1)
Sodium: 136 mmol/L (ref 135–145)

## 2019-08-15 LAB — URINALYSIS, MICROSCOPIC (REFLEX)
RBC / HPF: >50 RBC/hpf (ref 0–5)
WBC, UA: >50 WBC/hpf (ref 0–5)

## 2019-08-15 LAB — CBC WITH DIFFERENTIAL/PLATELET
Abs Immature Granulocytes: 0.05 10*3/uL (ref 0.00–0.07)
Basophils Absolute: 0.1 10*3/uL (ref 0.0–0.1)
Basophils Relative: 1 %
Eosinophils Absolute: 0.2 10*3/uL (ref 0.0–0.5)
Eosinophils Relative: 2 %
HCT: 39.4 % (ref 39.0–52.0)
Hemoglobin: 12.3 g/dL — ABNORMAL LOW (ref 13.0–17.0)
Immature Granulocytes: 0 %
Lymphocytes Relative: 6 %
Lymphs Abs: 0.7 10*3/uL (ref 0.7–4.0)
MCH: 29.3 pg (ref 26.0–34.0)
MCHC: 31.2 g/dL (ref 30.0–36.0)
MCV: 93.8 fL (ref 80.0–100.0)
Monocytes Absolute: 0.7 10*3/uL (ref 0.1–1.0)
Monocytes Relative: 6 %
Neutro Abs: 10.1 10*3/uL — ABNORMAL HIGH (ref 1.7–7.7)
Neutrophils Relative %: 85 %
Platelets: 312 10*3/uL (ref 150–400)
RBC: 4.2 MIL/uL — ABNORMAL LOW (ref 4.22–5.81)
RDW: 14 % (ref 11.5–15.5)
WBC: 11.8 10*3/uL — ABNORMAL HIGH (ref 4.0–10.5)
nRBC: 0 % (ref 0.0–0.2)

## 2019-08-15 MED ORDER — ONDANSETRON HCL 4 MG/2ML IJ SOLN: 4.0000 mg | Freq: Four times a day (QID) | INTRAMUSCULAR | Status: DC | PRN

## 2019-08-15 MED ORDER — SALINE SPRAY 0.65 % NA SOLN
2.0000 | Freq: Two times a day (BID) | NASAL | Status: DC
  Administered 2019-08-15 – 2019-08-21 (×11): 2 via NASAL
  Filled 2019-08-15: qty 44

## 2019-08-15 MED ORDER — CARVEDILOL 12.5 MG PO TABS
12.5000 mg | ORAL_TABLET | Freq: Two times a day (BID) | ORAL | Status: DC
  Administered 2019-08-17 – 2019-08-21 (×9): 12.5 mg via ORAL
  Filled 2019-08-15 (×9): qty 1

## 2019-08-15 MED ORDER — MELATONIN 3 MG PO TABS
3.0000 mg | ORAL_TABLET | Freq: Every evening | ORAL | Status: DC | PRN
  Administered 2019-08-18 – 2019-08-20 (×2): 3 mg via ORAL
  Filled 2019-08-15 (×3): qty 1

## 2019-08-15 MED ORDER — HYPROMELLOSE (GONIOSCOPIC) 2.5 % OP SOLN
2.0000 [drp] | Freq: Every day | OPHTHALMIC | Status: DC
  Administered 2019-08-15: 23:00:00 2 [drp] via OPHTHALMIC
  Filled 2019-08-15: qty 15

## 2019-08-15 MED ORDER — FENTANYL CITRATE (PF) 100 MCG/2ML IJ SOLN
50.0000 ug | Freq: Once | INTRAMUSCULAR | Status: AC
  Administered 2019-08-15: 15:00:00 50 ug via INTRAVENOUS
  Filled 2019-08-15: qty 2

## 2019-08-15 MED ORDER — DOCUSATE SODIUM 100 MG PO CAPS
100.0000 mg | ORAL_CAPSULE | Freq: Two times a day (BID) | ORAL | Status: DC
  Administered 2019-08-15 – 2019-08-19 (×8): 100 mg via ORAL
  Filled 2019-08-15 (×9): qty 1

## 2019-08-15 MED ORDER — ONDANSETRON HCL 4 MG PO TABS: 4.0000 mg | ORAL_TABLET | Freq: Four times a day (QID) | ORAL | Status: DC | PRN

## 2019-08-15 MED ORDER — SODIUM CHLORIDE 0.9 % IV SOLN
1.0000 g | Freq: Once | INTRAVENOUS | Status: AC
  Administered 2019-08-15: 15:00:00 1 g via INTRAVENOUS
  Filled 2019-08-15: qty 10

## 2019-08-15 MED ORDER — LORATADINE 10 MG PO TABS
10.0000 mg | ORAL_TABLET | Freq: Every day | ORAL | Status: DC
  Administered 2019-08-17 – 2019-08-21 (×5): 10 mg via ORAL
  Filled 2019-08-15 (×6): qty 1

## 2019-08-15 MED ORDER — PANTOPRAZOLE SODIUM 40 MG PO TBEC
40.0000 mg | DELAYED_RELEASE_TABLET | Freq: Every day | ORAL | Status: DC
  Administered 2019-08-17 – 2019-08-21 (×5): 40 mg via ORAL
  Filled 2019-08-15 (×5): qty 1

## 2019-08-15 MED ORDER — SIMVASTATIN 40 MG PO TABS
40.0000 mg | ORAL_TABLET | Freq: Every day | ORAL | Status: DC
  Administered 2019-08-16 – 2019-08-20 (×5): 40 mg via ORAL
  Filled 2019-08-15 (×5): qty 1

## 2019-08-15 MED ORDER — FENTANYL CITRATE (PF) 100 MCG/2ML IJ SOLN
25.0000 ug | INTRAMUSCULAR | Status: DC | PRN
  Administered 2019-08-15 – 2019-08-18 (×6): 25 ug via INTRAVENOUS
  Filled 2019-08-15 (×6): qty 2

## 2019-08-15 MED ORDER — ACETAMINOPHEN 650 MG RE SUPP: 650.0000 mg | Freq: Four times a day (QID) | RECTAL | Status: DC | PRN

## 2019-08-15 MED ORDER — SODIUM CHLORIDE 0.9 % IV SOLN
1.0000 g | INTRAVENOUS | Status: DC
  Administered 2019-08-16: 16:00:00 1 g via INTRAVENOUS
  Filled 2019-08-15: qty 1
  Filled 2019-08-15: qty 10

## 2019-08-15 MED ORDER — SODIUM CHLORIDE 0.9 % IR SOLN
3000.0000 mL | Status: DC
  Administered 2019-08-15 – 2019-08-16 (×2): 3000 mL

## 2019-08-15 MED ORDER — STERILE WATER FOR INJECTION IJ SOLN
INTRAMUSCULAR | Status: AC
  Filled 2019-08-15: qty 30

## 2019-08-15 MED ORDER — ACETAMINOPHEN 325 MG PO TABS
650.0000 mg | ORAL_TABLET | Freq: Four times a day (QID) | ORAL | Status: DC | PRN
  Administered 2019-08-19 – 2019-08-20 (×3): 650 mg via ORAL
  Filled 2019-08-15 (×3): qty 2

## 2019-08-15 MED ORDER — FENTANYL CITRATE (PF) 100 MCG/2ML IJ SOLN
25.0000 ug | Freq: Once | INTRAMUSCULAR | Status: AC
  Administered 2019-08-15: 11:00:00 25 ug via INTRAVENOUS
  Filled 2019-08-15: qty 2

## 2019-08-15 NOTE — ED Triage Notes (Signed)
Per EMS: pt from [facility] with c/o hematuria since last night.  Staff reported large amount of blood in urine.  Pt has a HX of prostate cancer 10 years ago.   EMS Vitals: BP 168/100 HR 88 RR 18 Temp 97.5   Pt Covid screened this week and was negative, however pt was positive 30 days ago.

## 2019-08-15 NOTE — ED Notes (Signed)
Attempted to give report on 6N28.

## 2019-08-15 NOTE — ED Notes (Signed)
Both CBI bags changed.

## 2019-08-15 NOTE — ED Notes (Signed)
Attempted to give report to 6N28.

## 2019-08-15 NOTE — ED Notes (Signed)
Urology at bedside.

## 2019-08-15 NOTE — ED Provider Notes (Signed)
[facility] EMERGENCY DEPARTMENT Provider Note   CSN: [id number] Arrival date & time: 08/15/19  [id number]     History No chief complaint on file.   Jesse [name] is a 86 y.o. male who presents to the ED from [facility] with complaint of hematuria that began last night.  Patient states that he has been passing a large amount of blood in his urine with blood clots.  He also complains of severe, constant, 10 out of 10, suprapubic abdominal pain with nausea.  No vomiting.  Patient reports he had a history of prostate cancer 10 years ago for which he received numerous amounts of radiation treatments.  No issues since then.  Patient also reports history of kidney stones but states this feels different.  Denies fevers, chills, penile discharge, any other associated symptoms.  Patient has not taken anything for pain.   The history is provided by the patient, medical records and the nursing home.       Past Medical History:  Diagnosis Date  . AAA (abdominal aortic aneurysm) ([place])   . Arthritis   . Cancer ([place]) 2010   prostate ( 40 Txs. of radiation treatments)  . Coronary artery disease   . Environmental allergies   . GERD (gastroesophageal reflux disease)   . HOH (hard of hearing)    wears bilateral hearing aids  . Hyperlipidemia   . Hypertension   . Kidney stone 1968  . Myocardial infarction ([place]) 1992  . Pneumonia    hx of  . RBBB     Patient Active Problem List   Diagnosis Date Noted  . Gross hematuria 08/15/2019  . History of prostate cancer 08/15/2019  . Anxiety 06/21/2019  . Anemia due to chronic illness 06/20/2019  . Pneumonia due to COVID-19 virus 06/20/2019  . Displaced spiral fracture of shaft of right femur, initial encounter for open fracture type I or II ([place]) 06/20/2019  . COVID-19 virus infection 06/19/2019  . Surgery, elective   . Hip fracture ([place]) 04/15/2019  . Fall   . Coronary artery disease involving native coronary artery of native heart  without angina pectoris 11/20/2018  . Thoracic aortic aneurysm without rupture ([place]) 11/20/2018  . Chronic systolic heart failure ([place]) 11/20/2018  . Duodenal ulcer with hemorrhage 05/05/2018  . Hemorrhagic shock ([place]) 05/05/2018  . AKI (acute kidney injury) ([place]) 05/05/2018  . Orthostatic hypotension 05/04/2018  . Symptomatic anemia 05/04/2018  . Melena 05/04/2018  . GI bleed 05/04/2018  . Patella fracture 06/09/2015  . Aftercare following surgery of the circulatory system, [place] 01/08/2013  . Pain in limb- Left popliteal 12/25/2012  . 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 Aneurysm of abdominal vessel ([place]) 02/21/2012  . Aneurysm of iliac artery ([facility]) 08/09/2011  . Aneurysm of artery of lower extremity ([place]) 08/09/2011  . HLD (hyperlipidemia) 01/16/2009  . Essential hypertension 01/16/2009  . CAD, ARTERY BYPASS GRAFT 01/16/2009  . PERIPHERAL VASCULAR DISEASE 01/16/2009  . ABDOMINAL AORTIC ANEURYSM REPAIR, HX OF 01/16/2009  . MIXED HYPERLIPIDEMIA 12/28/2008    Past Surgical History:  Procedure Laterality Date  . ABDOMINAL AORTIC ANEURYSM REPAIR  08-13-1993   Dr Cameron Sprang  . CARDIAC CATHETERIZATION    . COLONOSCOPY W/ POLYPECTOMY    . CORONARY ARTERY BYPASS GRAFT  03-01-1991   Dr. Servando Snare  . ESOPHAGOGASTRODUODENOSCOPY (EGD) WITH PROPOFOL N/A 05/04/2018   Procedure: ESOPHAGOGASTRODUODENOSCOPY (EGD) WITH PROPOFOL;  Surgeon: [name], MD;  Location: [facility]COPY;  Service: Gastroenterology;  Laterality: N/A;  . EYE SURGERY     Detached retina (Left eye); bilateral cataract removal  . FALSE ANEURYSM REPAIR  05/07/2012   Procedure: REPAIR FALSE ANEURYSM;  Surgeon: Rosetta Posner, MD;  Location: [facility] OR;  Service: Vascular;  Laterality: Left;  Repair of Left Femoral Artery Aneurysm  . HERNIA REPAIR    . HOT HEMOSTASIS N/A 05/04/2018   Procedure: HOT HEMOSTASIS (ARGON PLASMA COAGULATION/BICAP);  Surgeon: [name], MD;   Location: [facility];  Service: Gastroenterology;  Laterality: N/A;  . INTRAMEDULLARY (IM) NAIL INTERTROCHANTERIC Left 04/16/2019   Procedure: INTRAMEDULLARY (IM) NAIL INTERTROCHANTRIC;  Surgeon: Rod Can, MD;  Location: [facility] ORS;  Service: Orthopedics;  Laterality: Left;  . INTRAMEDULLARY (IM) NAIL INTERTROCHANTERIC Right 06/17/2019   Procedure: RIGHT INTRAMEDULLARY (IM) NAIL INTERTROCHANTRIC;  Surgeon: Rod Can, MD;  Location: [facility];  Service: Orthopedics;  Laterality: Right;  . ORIF PATELLA Right 06/09/2015   Procedure: OPEN REDUCTION INTERNAL (ORIF) FIXATION PATELLA;  Surgeon: Marybelle Killings, MD;  Location: [facility];  Service: Orthopedics;  Laterality: Right;  . repair of bowel obstruction  1999   Dr. Dalbert Batman  . repair of ventral hernia  04-20-1994   P. Cameron Sprang MD  . resection femoral artery  03/19/2012   Dr. Curt Jews  . SUBMUCOSAL INJECTION  05/04/2018   Procedure: SUBMUCOSAL INJECTION;  Surgeon: [name], MD;  Location: [facility];  Service: Gastroenterology;;       Family History  Problem Relation Age of Onset  . Cancer Mother   . Cancer Father   . Varicose Veins Father   . Cancer Sister   . Diabetes Sister   . Heart attack Sister     Social History   Tobacco Use  . Smoking status: Former Smoker    Years: 20.00    Types: Cigarettes    Quit date: 07/25/1968    Years since quitting: 51.0  . Smokeless tobacco: Never Used  Substance Use Topics  . Alcohol use: No  . Drug use: No    Home Medications Prior to Admission medications   Medication Sig Start Date End Date Taking? Authorizing Provider  acetaminophen (TYLENOL) 500 MG tablet Take 500 mg by mouth every 6 (six) hours as needed for mild pain or headache.   Yes [provider]  alendronate (FOSAMAX) 70 MG tablet Take 70 mg by mouth once a week. Saturday 08/14/19  Yes [provider]  aspirin 81 MG tablet Take 81 mg by mouth daily.   Yes [provider]   carboxymethylcellulose (REFRESH PLUS) 0.5 % SOLN Apply 2 drops to eye at bedtime.    Yes [provider]  carvedilol (COREG) 12.5 MG tablet TAKE 1 TABLET BY MOUTH 2 TIMES DAILY WITH A MEAL Patient taking differently: Take 12.5 mg by mouth 2 (two) times daily with a meal.  05/29/19  Yes [name], MD  Cyanocobalamin (B-12 PO) Take 1,000 mcg by mouth daily.    Yes [provider]  docusate sodium (COLACE) 100 MG capsule Take 2 capsules (200 mg total) by mouth 2 (two) times daily. 04/20/19  Yes Georgette Shell, MD  fenofibrate 160 MG tablet Take 1 tablet (160 mg total) by mouth daily. 05/29/19  Yes [name], MD  ferrous sulfate 325 (65 FE) MG tablet Take 325 mg by mouth daily with breakfast.   Yes [provider]  loratadine (CLARITIN) 10 MG tablet Take 10 mg by mouth daily.    Yes [provider]  Melatonin 3 MG TABS Take 3 mg by mouth at bedtime as needed (sleep).   Yes [provider]  pantoprazole (PROTONIX) 40 MG tablet Take 40 mg by mouth daily.   Yes [provider]  simvastatin (ZOCOR) 40 MG tablet Take 1 tablet (40 mg total) by mouth daily at 6 PM. 05/29/19  Yes [name], MD  sodium chloride (OCEAN) 0.65 % SOLN nasal spray Place 2 sprays into both nostrils 2 (two) times daily.   Yes [provider]  enoxaparin (LOVENOX) 40 MG/0.4ML injection Inject 0.4 mLs (40 mg total) into the skin daily. 06/19/19 07/19/19  Swinteck, Aaron Edelman, MD    Allergies    Quinolones, Adhesive [tape], and Lactose intolerance (gi)  Review of Systems   Review of Systems  Constitutional: Negative for chills and fever.  Gastrointestinal: Positive for abdominal pain and nausea. Negative for constipation, diarrhea and rectal pain.  Genitourinary: Positive for hematuria.  All other systems reviewed and are negative.   Physical Exam Updated Vital Signs BP (!) 172/125   Pulse 80   Wt 79.4 kg   SpO2 98%   BMI 23.73 kg/m   Physical  Exam Vitals and nursing note reviewed.  Constitutional:      Comments: Uncomfortable appearing male; writhing around in bed s/2 pain  HENT:     Head: Normocephalic and atraumatic.  Eyes:     Conjunctiva/sclera: Conjunctivae normal.  Cardiovascular:     Rate and Rhythm: Normal rate and regular rhythm.     Pulses: Normal pulses.  Pulmonary:     Effort: Pulmonary effort is normal.     Breath sounds: Normal breath sounds. No wheezing, rhonchi or rales.  Abdominal:     Palpations: Abdomen is soft.     Tenderness: There is abdominal tenderness in the suprapubic area. There is no right CVA tenderness, left CVA tenderness, guarding or rebound.  Musculoskeletal:     Cervical back: Neck supple.  Skin:    General: Skin is warm and dry.  Neurological:     Mental Status: He is alert.     ED Results / Procedures / Treatments   Labs (all labs ordered are listed, but only abnormal results are displayed) Labs Reviewed  URINALYSIS, ROUTINE W REFLEX MICROSCOPIC - Abnormal; Notable for the following components:      Result Value   Color, Urine RED (*)    APPearance TURBID (*)    Glucose, UA   (*)    Value: TEST NOT REPORTED DUE TO COLOR INTERFERENCE OF URINE PIGMENT   Hgb urine dipstick   (*)    Value: TEST NOT REPORTED DUE TO COLOR INTERFERENCE OF URINE PIGMENT   Bilirubin Urine   (*)    Value: TEST NOT REPORTED DUE TO COLOR INTERFERENCE OF URINE PIGMENT   Ketones, ur   (*)    Value: TEST NOT REPORTED DUE TO COLOR INTERFERENCE OF URINE PIGMENT   Protein, ur   (*)    Value: TEST NOT REPORTED DUE TO COLOR INTERFERENCE OF URINE PIGMENT   Nitrite   (*)    Value: TEST NOT REPORTED DUE TO COLOR INTERFERENCE OF URINE PIGMENT   Leukocytes,Ua   (*)    Value: TEST NOT REPORTED DUE TO COLOR INTERFERENCE OF URINE PIGMENT   All other components within normal limits  BASIC METABOLIC PANEL - Abnormal; Notable for the following components:   CO2 20 (*)    Glucose, Bld 125 (*)    BUN 25 (*)  Calcium 10.4 (*)    GFR calc non Af Amer 59 (*)    All other components within normal limits  CBC WITH DIFFERENTIAL/PLATELET - Abnormal; Notable for the following components:   WBC 11.8 (*)    RBC 4.20 (*)    Hemoglobin 12.3 (*)    Neutro Abs 10.1 (*)    All other components within normal limits  URINALYSIS, MICROSCOPIC (REFLEX) - Abnormal; Notable for the following components:   Bacteria, UA RARE (*)    All other components within normal limits  URINE CULTURE  SARS CORONAVIRUS 2 (TAT 6-24 HRS)    EKG EKG Interpretation  Date/Time:  Thursday August 15 2019 10:15:55 EST Ventricular Rate:  80 PR Interval:    QRS Duration: 136 QT Interval:  434 QTC Calculation: 501 R Axis:   -67 Text Interpretation: Sinus rhythm IVCD, consider atypical RBBB Left ventricular hypertrophy Inferior infarct, old Confirmed by Elnora Morrison [id number]) on 08/15/2019 10:17:10 AM   Radiology CT Renal Stone Study  Result Date: 08/15/2019 CLINICAL DATA:  With gross hematuria since last night, large amount of blood in urine, history prostate cancer 10 years ago, has been having trouble urinating EXAM: CT ABDOMEN AND PELVIS WITHOUT CONTRAST TECHNIQUE: Multidetector CT imaging of the abdomen and pelvis was performed following the standard protocol without IV contrast. Sagittal and coronal MPR images reconstructed from axial data set. No oral contrast administered for this indication COMPARISON:  05/04/2018 FINDINGS: Lower chest: Emphysematous changes at lung bases with bibasilar reticulonodular interstitial disease. No acute infiltrate or pleural effusion. Hepatobiliary: Multiple calcified gallstones within gallbladder. Liver unremarkable. Pancreas: Normal appearance Spleen: Normal appearance Adrenals/Urinary Tract: Adrenal glands normal appearance. Small BILATERAL renal cysts largest mid RIGHT kidney 3.7 x 3.1 cm. Mild dilatation of LEFT renal collecting system. With mild proximal periureteral edema. No urinary tract  calcification or ureteral dilatation identified. Foley catheter within urinary bladder hemorrhage/clot. Stomach/Bowel: Stomach decompressed, suboptimal assessment of wall thickness. Large and small bowel loops unremarkable. Normal appendix. Vascular/Lymphatic: Extensive atherosclerotic changes of aorta, iliac arteries, visceral arteries. Abdominal aortic aneurysm 4.2 x 4.0 cm with prior aneurysm repair, with graft extending to LEFT common iliac artery and RIGHT common femoral artery. Mark aneurysmal dilatation of internal iliac arteries bilaterally, RIGHT 7.8 x 6.8 cm extending 8.8 cm length previously 7.3 x 6.9 cm extending 7.2 cm length and LEFT 6.2 x 5.3 cm previously 5.4 x 4.7 cm. 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 No evidence of aneurysm rupture/hemorrhage. No adenopathy. Reproductive: Minimal prostatic enlargement. Other: No free air or free fluid. No hernia or acute inflammatory process. Musculoskeletal: Osseous demineralization with orthopedic hardware at the proximal femora bilaterally. Degenerative disc disease changes of the lumbar spine. IMPRESSION: Abdominal aortic aneurysm post repair. BILATERAL internal iliac artery aneurysms measuring up to 7.8 x 6.8 cm on RIGHT and 6.2 x 5.3 cm on LEFT, both slightly increased. High attenuation material in urinary bladder consistent with hematoma/clot, uncertain etiology. BILATERAL renal cysts with mild dilatation of the LEFT renal collecting system though no obstructing calculus is identified, question from UPJ obstruction. Cholelithiasis. 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 Aortic Atherosclerosis (ICD10-I70.0). Aortic aneurysm NOS (ICD10-I71.9). Electronically Signed   By: Lavonia Dana M.D.   On: 08/15/2019 13:48    Procedures Procedures (including critical care time)  Medications Ordered in ED Medications  sterile water (preservative free) injection (  Not Given 08/15/19  1503)  sodium chloride irrigation 0.9 % 3,000 mL (3,000 mLs  Irrigation New Bag/Given 08/15/19 1226)  fentaNYL (SUBLIMAZE) injection 25 mcg (has no administration in time range)  cefTRIAXone (ROCEPHIN) 1 g in sodium chloride 0.9 % 100 mL IVPB (has no administration in time range)  carvedilol (COREG) tablet 12.5 mg (has no administration in time range)  simvastatin (ZOCOR) tablet 40 mg (has no administration in time range)  pantoprazole (PROTONIX) EC tablet 40 mg (has no administration in time range)  Melatonin TABS 3 mg (has no administration in time range)  loratadine (CLARITIN) tablet 10 mg (has no administration in time range)  sodium chloride (OCEAN) 0.65 % nasal spray 2 spray (has no administration in time range)  carboxymethylcellulose (REFRESH PLUS) 0.5 % ophthalmic solution 2 drop (has no administration in time range)  acetaminophen (TYLENOL) tablet 650 mg (has no administration in time range)    Or  acetaminophen (TYLENOL) suppository 650 mg (has no administration in time range)  docusate sodium (COLACE) capsule 100 mg (has no administration in time range)  ondansetron (ZOFRAN) tablet 4 mg (has no administration in time range)    Or  ondansetron (ZOFRAN) injection 4 mg (has no administration in time range)  fentaNYL (SUBLIMAZE) injection 25 mcg (25 mcg Intravenous Given 08/15/19 1033)  cefTRIAXone (ROCEPHIN) 1 g in sodium chloride 0.9 % 100 mL IVPB (1 g Intravenous New Bag/Given 08/15/19 1443)  fentaNYL (SUBLIMAZE) injection 50 mcg (50 mcg Intravenous Given 08/15/19 1444)    ED Course  I have reviewed the triage vital signs and the nursing notes.  Pertinent labs & imaging results that were available during my care of the patient were reviewed by me and considered in my medical decision making (see chart for details).  86 year old male presents to the ED today complaining of hematuria that began last night with suprapubic abdominal pain.  She has a history of prostate cancer 10 years  ago.  On arrival to the ED patient is afebrile, nontachycardic and nontachypneic.  He is uncomfortable appearing holding her stomach and writhing in pain.  25 mcg of fentanyl given for patient.  After his pain subsided I was able to perform exam fully.  Patient does have some suprapubic abdominal tenderness without rebound or guarding.  No other focal tenderness to abdomen. No CVA tenderness.  Patient attempted to urinate by himself but was unable to, not cath with grossly bloody urine.  Afterwards there is a blood clot that blocked off flow and cath was removed.  Bladder scan afterwards showed a 500 cc fluids.  Will place Foley catheter with continuous bladder irrigation with hematuria.  Will work-up with urine analysis, urine culture, screening labs.  CT renal stone study obtained as well.  Fluids and pain medication given for symptomatic relief.   Urinalysis unable to be fully obtained due to amount of blood.  There does appear to be greater than 50 white blood cells and greater than 50 red blood cells per high-power field.  Will order Rocephin to cover for the UTI.  CBC with leukocytosis of 11,000 although this appears improving from previous.  Hemoglobin stable at 12,000.  BMP mild elevation in creatinine at 1.10, baseline around 0.90.  Patient already receiving fluids.   CTscan with bilateral renal cysts, clot seen in bladder, no obvious stones.    Nursing staff informs that once irrigation of CBI slows down the urine clots again. Pt still endorsing pain from now his penis due to the catheter placement. Given his age do not feel he would do very well at home; will consult  urology with plan for medicine admission.   Discussed case with Dr. Jeffie Pollock will come evaluate patient with plan to take to the OR for washout of his bladder.  Will consult medicine for admission. Pt made NPO.   Discussed case with Dr. Lorin Mercy who agrees to accept patient for admission.     MDM Rules/Calculators/A&P                        Final Clinical Impression(s) / ED Diagnoses Final diagnoses:  Hematuria of unknown cause    Rx / DC Orders ED Discharge Orders    None       Eustaquio Maize, PA-C 08/15/19 1607    Elnora Morrison, MD 08/17/19 [phone]

## 2019-08-15 NOTE — ED Notes (Signed)
CBI initiated.

## 2019-08-15 NOTE — H&P (Signed)
History and Physical    Jesse [name] [id number] DOB: 07/05/1928 DOA: 08/15/2019  PCP: [name], MD Consultants:  Swinteck - orthopedics; Burt Knack - cardiology; Gerhardt - CT surgery; Karsten Ro - urology Patient coming from: [facility]; NOK: Larrie Kass, [phone]  Chief Complaint: Jesse [name] hematuria  HPI: Jesse [name] is a 86 y.o. male with medical history significant of CAD; nephrolithiasis; HTN; HLD; CAD s/p CABG; mild dementia; AAA; COVID-19 infection in 05/2019; and remote prostate cancer treated with XRT presenting with gross hematuria.  He reports that last night he noticed acute onset of gross hematuria with associated pelvic pain and cramping.  He has continued to have hematuria through the day today and so his ALF decided to have him sent in to the ER.  No symptoms prior to onset yesterday.  He had a left hip fracture with repair in September and a right hip fracture with repair in November.  At that time, he was found to be COVID-19 positive and was treated with remdesivir at [facility].   ED Course:  Gross hematuria since yesterday.  Foley placed with CBI.  Dr. Jeffie Pollock will come see patient - planning to take him to the OR for washout.  Review of Systems: As per HPI; otherwise review of systems reviewed and negative.   Ambulatory Status:  Ambulates with a walker  Past Medical History:  Diagnosis Date  . AAA (abdominal aortic aneurysm) ([place])   . Arthritis   . Cancer ([place]) 2010   prostate ( 40 Txs. of radiation treatments)  . Coronary artery disease   . Environmental allergies   . GERD (gastroesophageal reflux disease)   . HOH (hard of hearing)    wears bilateral hearing aids  . Hyperlipidemia   . Hypertension   . Kidney stone 1968  . Myocardial infarction ([place]) 1992  . Pneumonia    hx of  . RBBB     Past Surgical History:  Procedure Laterality Date  . ABDOMINAL AORTIC ANEURYSM REPAIR  08-13-1993   Dr Cameron Sprang  . CARDIAC CATHETERIZATION    .  COLONOSCOPY W/ POLYPECTOMY    . CORONARY ARTERY BYPASS GRAFT  03-01-1991   Dr. Servando Snare  . ESOPHAGOGASTRODUODENOSCOPY (EGD) WITH PROPOFOL N/A 05/04/2018   Procedure: ESOPHAGOGASTRODUODENOSCOPY (EGD) WITH PROPOFOL;  Surgeon: [name], MD;  Location: [facility] ENDOSCOPY;  Service: Gastroenterology;  Laterality: N/A;  . EYE SURGERY     Detached retina (Left eye); bilateral cataract removal  . FALSE ANEURYSM REPAIR  05/07/2012   Procedure: REPAIR FALSE ANEURYSM;  Surgeon: Rosetta Posner, MD;  Location: [name] OR;  Service: Vascular;  Laterality: Left;  Repair of Left Femoral Artery Aneurysm  . HERNIA REPAIR    . HOT HEMOSTASIS N/A 05/04/2018   Procedure: HOT HEMOSTASIS (ARGON PLASMA COAGULATION/BICAP);  Surgeon: [name], MD;  Location: [facility];  Service: Gastroenterology;  Laterality: N/A;  . INTRAMEDULLARY (IM) NAIL INTERTROCHANTERIC Left 04/16/2019   Procedure: INTRAMEDULLARY (IM) NAIL INTERTROCHANTRIC;  Surgeon: Rod Can, MD;  Location: [facility] ORS;  Service: Orthopedics;  Laterality: Left;  . INTRAMEDULLARY (IM) NAIL INTERTROCHANTERIC Right 06/17/2019   Procedure: RIGHT INTRAMEDULLARY (IM) NAIL INTERTROCHANTRIC;  Surgeon: Rod Can, MD;  Location: [facility] ORS;  Service: Orthopedics;  Laterality: Right;  . ORIF PATELLA Right 06/09/2015   Procedure: OPEN REDUCTION INTERNAL (ORIF) FIXATION PATELLA;  Surgeon: Marybelle Killings, MD;  Location: [facility];  Service: Orthopedics;  Laterality: Right;  . repair of bowel obstruction  1999   Dr. Dalbert Batman  . repair of  ventral hernia  04-20-1994   P. Cameron Sprang MD  . resection femoral artery  03/19/2012   Dr. Curt Jews  . SUBMUCOSAL INJECTION  05/04/2018   Procedure: SUBMUCOSAL INJECTION;  Surgeon: [name], MD;  Location: [facility];  Service: Gastroenterology;;    Social History   Socioeconomic History  . Marital status: Widowed    Spouse name: Not on file  . Number of children: Not on file  . Years of education: Not on file  . Highest  education level: Not on file  Occupational History  . Not on file  Tobacco Use  . Smoking status: Former Smoker    Years: 20.00    Types: Cigarettes    Quit date: 07/25/1968    Years since quitting: 51.0  . Smokeless tobacco: Never Used  Substance and Sexual Activity  . Alcohol use: No  . Drug use: No  . Sexual activity: Not on file  Other Topics Concern  . Not on file  Social History Narrative  . Not on file   Social Determinants of Health   Financial Resource Strain:   . Difficulty of Paying Living Expenses: Not on file  Food Insecurity:   . Worried About Charity fundraiser in the Last Year: Not on file  . Ran Out of Food in the Last Year: Not on file  Transportation Needs:   . Lack of Transportation (Medical): Not on file  . Lack of Transportation (Non-Medical): Not on file  Physical Activity:   . Days of Exercise per Week: Not on file  . Minutes of Exercise per Session: Not on file  Stress:   . Feeling of Stress : Not on file  Social Connections:   . Frequency of Communication with Friends and Family: Not on file  . Frequency of Social Gatherings with Friends and Family: Not on file  . Attends Religious Services: Not on file  . Active Member of Clubs or Organizations: Not on file  . Attends Archivist Meetings: Not on file  . Marital Status: Not on file  Intimate Partner Violence:   . Fear of Current or Ex-Partner: Not on file  . Emotionally Abused: Not on file  . Physically Abused: Not on file  . Sexually Abused: Not on file    Allergies  Allergen Reactions  . Quinolones Other (See Comments)    unknown  . Adhesive [Tape] Itching and Rash  . Lactose Intolerance (Gi) Other (See Comments)    Gas, bloating, stomach indigestion     Family History  Problem Relation Age of Onset  . Cancer Mother   . Cancer Father   . Varicose Veins Father   . Cancer Sister   . Diabetes Sister   . Heart attack Sister     Prior to Admission medications     Medication Sig Start Date End Date Taking? Authorizing Provider  acetaminophen (TYLENOL) 500 MG tablet Take 500 mg by mouth every 6 (six) hours as needed for mild pain or headache.   Yes [provider]  alendronate (FOSAMAX) 70 MG tablet Take 70 mg by mouth once a week. Saturday 08/14/19  Yes [provider]  aspirin 81 MG tablet Take 81 mg by mouth daily.   Yes [provider]  carboxymethylcellulose (REFRESH PLUS) 0.5 % SOLN Apply 2 drops to eye at bedtime.    Yes [provider]  carvedilol (COREG) 12.5 MG tablet TAKE 1 TABLET BY MOUTH 2 TIMES DAILY WITH A MEAL Patient taking  differently: Take 12.5 mg by mouth 2 (two) times daily with a meal.  05/29/19  Yes [name], MD  Cyanocobalamin (B-12 PO) Take 1,000 mcg by mouth daily.    Yes [provider]  docusate sodium (COLACE) 100 MG capsule Take 2 capsules (200 mg total) by mouth 2 (two) times daily. 04/20/19  Yes Georgette Shell, MD  fenofibrate 160 MG tablet Take 1 tablet (160 mg total) by mouth daily. 05/29/19  Yes [name], MD  ferrous sulfate 325 (65 FE) MG tablet Take 325 mg by mouth daily with breakfast.   Yes [provider]  loratadine (CLARITIN) 10 MG tablet Take 10 mg by mouth daily.    Yes [provider]  Melatonin 3 MG TABS Take 3 mg by mouth at bedtime as needed (sleep).   Yes [provider]  pantoprazole (PROTONIX) 40 MG tablet Take 40 mg by mouth daily.   Yes [provider]  simvastatin (ZOCOR) 40 MG tablet Take 1 tablet (40 mg total) by mouth daily at 6 PM. 05/29/19  Yes [name], MD  sodium chloride (OCEAN) 0.65 % SOLN nasal spray Place 2 sprays into both nostrils 2 (two) times daily.   Yes [provider]  enoxaparin (LOVENOX) 40 MG/0.4ML injection Inject 0.4 mLs (40 mg total) into the skin daily. 06/19/19 07/19/19  Swinteck, Aaron Edelman, MD  HYDROcodone-acetaminophen (NORCO/VICODIN) 5-325 MG tablet Take 1 tablet by  mouth every 4 (four) hours as needed for moderate pain. Patient not taking: Reported on 08/15/2019 06/18/19   Rod Can, MD    Physical Exam: Vitals:   08/15/19 1230 08/15/19 1415 08/15/19 1500 08/15/19 1515  BP: 124/76 (!) 154/82 (!) 154/98 (!) 158/112  Pulse: 76 80 94 93  Resp: 15 14 19  (!) 25  SpO2: 95% 100% 96% 96%  Weight:         . General:  Appears calm and comfortable and is NAD, conversant . Eyes:  PERRL, EOMI, normal lids, iris . ENT:  grossly normal hearing, lips & tongue, mmm; appropriate dentition . Neck:  no LAD, masses or thyromegaly . Cardiovascular:  RRR, no m/r/g. 2+ LE edema.  Marland Kitchen Respiratory:   CTA bilaterally with no wheezes/rales/rhonchi.  Normal respiratory effort. . Abdomen:  soft, significant suprapubic TTP, ND, NABS; foley in place with CBI with serous drainage . Skin:  no rash or induration seen on limited exam . Musculoskeletal:  grossly normal tone BUE/BLE, good ROM, no bony abnormality . Psychiatric:  grossly normal mood and affect, speech fluent and appropriate, AOx2 . Neurologic:  CN 2-12 grossly intact, moves all extremities in coordinated fashion    Radiological Exams on Admission: CT Renal Stone Study  Result Date: 08/15/2019 CLINICAL DATA:  With gross hematuria since last night, large amount of blood in urine, history prostate cancer 10 years ago, has been having trouble urinating EXAM: CT ABDOMEN AND PELVIS WITHOUT CONTRAST TECHNIQUE: Multidetector CT imaging of the abdomen and pelvis was performed following the standard protocol without IV contrast. Sagittal and coronal MPR images reconstructed from axial data set. No oral contrast administered for this indication COMPARISON:  05/04/2018 FINDINGS: Lower chest: Emphysematous changes at lung bases with bibasilar reticulonodular interstitial disease. No acute infiltrate or pleural effusion. Hepatobiliary: Multiple calcified gallstones within gallbladder. Liver unremarkable. Pancreas: Normal  appearance Spleen: Normal appearance Adrenals/Urinary Tract: Adrenal glands normal appearance. Small BILATERAL renal cysts largest mid RIGHT kidney 3.7 x 3.1 cm. Mild dilatation of LEFT renal collecting system. With mild proximal periureteral edema. No  urinary tract calcification or ureteral dilatation identified. Foley catheter within urinary bladder hemorrhage/clot. Stomach/Bowel: Stomach decompressed, suboptimal assessment of wall thickness. Large and small bowel loops unremarkable. Normal appendix. Vascular/Lymphatic: Extensive atherosclerotic changes of aorta, iliac arteries, visceral arteries. Abdominal aortic aneurysm 4.2 x 4.0 cm with prior aneurysm repair, with graft extending to LEFT common iliac artery and RIGHT common femoral artery. Mark aneurysmal dilatation of internal iliac arteries bilaterally, RIGHT 7.8 x 6.8 cm extending 8.8 cm length previously 7.3 x 6.9 cm extending 7.2 cm length and LEFT 6.2 x 5.3 cm previously 5.4 x 4.7 cm. 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 No evidence of aneurysm rupture/hemorrhage. No adenopathy. Reproductive: Minimal prostatic enlargement. Other: No free air or free fluid. No hernia or acute inflammatory process. Musculoskeletal: Osseous demineralization with orthopedic hardware at the proximal femora bilaterally. Degenerative disc disease changes of the lumbar spine. IMPRESSION: Abdominal aortic aneurysm post repair. BILATERAL internal iliac artery aneurysms measuring up to 7.8 x 6.8 cm on RIGHT and 6.2 x 5.3 cm on LEFT, both slightly increased. High attenuation material in urinary bladder consistent with hematoma/clot, uncertain etiology. BILATERAL renal cysts with mild dilatation of the LEFT renal collecting system though no obstructing calculus is identified, question from UPJ obstruction. Cholelithiasis. 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 Aortic Atherosclerosis (ICD10-I70.0).  Aortic aneurysm NOS (ICD10-I71.9). Electronically Signed   By: Lavonia Dana M.D.   On: 08/15/2019 13:48    EKG: Independently reviewed.  NSR with rate 80; IVCD, LVH; nonspecific ST changes with no evidence of acute ischemia   Labs on Admission: I have personally reviewed the available labs and imaging studies at the time of the admission.  Pertinent labs:   CO2 20 Glucose 125 BUN 25/Creatinine 1.10/GFR 59 - stable WBC 11.8 Hgb 12.3 UA: turbid, red, rare bacteria, >50 RBC, >50 WBC Urine culture is pending   Assessment/Plan Principal Problem:   Gross hematuria Active Problems:   HLD (hyperlipidemia)   Essential hypertension   PERIPHERAL VASCULAR DISEASE   Chronic systolic heart failure (HCC)   COVID-19 virus infection   History of prostate cancer   Gross hematuria -Patient presented with acute onset of gross hematuria and worsening symptoms since last night -Foley was placed and CBI was initiated with some relief -Patient is currently pleasant, appears comfortable, and is quite conversant -He continues to have mild to moderate suprapubic TTP -Abd CT with B renal cysts, clots, but no obvious source or stones; there is note of ?UPJ obstruction -UA could not be effectively performed due to blood; culture pending -Urology has been consulted -Will observe overnight for now on Med Surg -Will keep NPO for now in case Dr. Jeffie Pollock opts to take the patient to the OR for washout later tonight -Continue Rocephin for empiric treatment of UTI for now -This also may be related to prior h/o radiation treatment for prostate CA or even recurrent prostate CA -Will hold ASA  PVD -Patient has multiple known aneurysms including AAA s/p repair -CT today shows slightly increased size of B internal iliac artery aneurysms -He is due for annual CT surgery f/u in June, could move up sooner if desired but currently there does not appear to be an indication for inpatient evaluation -Resume ASA when  able  H/o prostate CA -Appears to have completed outpatient treatment in 2010 -He may have increased prostatic friability associated with prior rad treatment -Will defer to urology  HTN -Continue Coreg  HLD -Continue  Zocor -Hold fenofibrate due to limited inpatient utility  Chronic combined CHF -05/2018 echo with EF 40-45% and grade 1 diastolic dysfunction -Appears to be compensated at this time -Will need to monitor for evidence of volume overload  H/o COVID-19 infection -Patient with prior documented infection on 11/21 -He remains within the 90-day window and so should not be retested, or test results will not change management - he does not require precautions at this time regardless of his test results    DVT prophylaxis: SCDs Code Status:  DNR - confirmed with patient/prior records Family Communication: None present; I was unable to reach his son by telephone at the time of admission Disposition Plan:  Back to ALF once clinically improved Consults called: Urology Admission status: It is my clinical opinion that referral for OBSERVATION is reasonable and necessary in this patient based on the above information provided. The aforementioned taken together are felt to place the patient at high risk for further clinical deterioration. However it is anticipated that the patient may be medically stable for discharge from the hospital within 24 to 48 hours.    Karmen Bongo MD [facility]   How to contact the [facility] Attending or Consulting provider [place] or covering provider during after hours [place], for this patient?  1. Check the care team in [facility] and look for a) attending/consulting [facility] provider listed and b) the [facility] team listed 2. Log into [email or web address] and use [place] universal password to access. If you do not have the password, please contact the hospital operator. 3. Locate the [facility] provider you are looking for under [facility] and page to a number  that you can be directly reached. 4. If you still have difficulty reaching the provider, please page the [facility] (Director on Call) for the Hospitalists listed on amion for assistance.   08/15/2019, 4:11 PM

## 2019-08-15 NOTE — Consult Note (Signed)
Urology Consult   Physician requesting consult: Elnora Morrison  Reason for consult: Clot retention  History of Present Illness: Jesse [name] is a 86 y.o. male with PMH significant for AAA s/p repair, prostate cancer s/p EBRT 2009 and hormone therapy, CAD, and kidney stones who presented to the ED today with c/o severe abdominal pain and hematuria that began last night.  Pt states prior to yesterday he was voiding without issue.  He thinks he may have had an episode of hematuria last week that quickly resolved. He denies F/C, CP, SOB, N/V, dysuria, straining to urinate, and feelings of incomplete voiding. While in the ED he was unable to void and a foley was placed that clotted.  A 22 3-way was placed and CBI started.  UA could not be performed due to color interference, WBC 11, Cr 1.1 (baseline 0.9).  CT A/P shows bilateral renal cysts and hematoma/clot in bladder with no stones.   Pt's PSA remained low after treatment for prostate cancer and Dr. Karsten Ro told pt he only needed for f/u prn after his last visit in 2018.  He is currently resting in the ED.  CBI running at high rate and pt tolerating relatively well. He is still complaining of some lower abdominal discomfort but states that it is much improved since foley placement and pain meds.  Pt will be admitted by IM.   Past Medical History:  Diagnosis Date  . AAA (abdominal aortic aneurysm) ([place])   . Arthritis   . Bronchitis    hx of appro 40 years ago  . Cancer ([place]) 2010   prostate ( 40 Txs. of radiation treatments)  . Coronary artery disease   . Environmental allergies   . GERD (gastroesophageal reflux disease)   . HOH (hard of hearing)    wears bilateral hearing aids  . Hyperlipidemia   . Hypertension   . Kidney stone 1968  . Myocardial infarction ([place]) 1992  . Pneumonia    hx of  . RBBB     Past Surgical History:  Procedure Laterality Date  . ABDOMINAL AORTIC ANEURYSM REPAIR  08-13-1993   Dr Cameron Sprang  . CARDIAC  CATHETERIZATION    . COLONOSCOPY W/ POLYPECTOMY    . CORONARY ARTERY BYPASS GRAFT  03-01-1991   Dr. Servando Snare  . ESOPHAGOGASTRODUODENOSCOPY (EGD) WITH PROPOFOL N/A 05/04/2018   Procedure: ESOPHAGOGASTRODUODENOSCOPY (EGD) WITH PROPOFOL;  Surgeon: [name], MD;  Location: [facility];  Service: Gastroenterology;  Laterality: N/A;  . EYE SURGERY     Detached retina (Left eye); bilateral cataract removal  . FALSE ANEURYSM REPAIR  05/07/2012   Procedure: REPAIR FALSE ANEURYSM;  Surgeon: Rosetta Posner, MD;  Location: [facility] OR;  Service: Vascular;  Laterality: Left;  Repair of Left Femoral Artery Aneurysm  . HERNIA REPAIR    . HOT HEMOSTASIS N/A 05/04/2018   Procedure: HOT HEMOSTASIS (ARGON PLASMA COAGULATION/BICAP);  Surgeon: [name], MD;  Location: [facility];  Service: Gastroenterology;  Laterality: N/A;  . INTRAMEDULLARY (IM) NAIL INTERTROCHANTERIC Left 04/16/2019   Procedure: INTRAMEDULLARY (IM) NAIL INTERTROCHANTRIC;  Surgeon: Rod Can, MD;  Location: [facility] ORS;  Service: Orthopedics;  Laterality: Left;  . INTRAMEDULLARY (IM) NAIL INTERTROCHANTERIC Right 06/17/2019   Procedure: RIGHT INTRAMEDULLARY (IM) NAIL INTERTROCHANTRIC;  Surgeon: Rod Can, MD;  Location: [facility];  Service: Orthopedics;  Laterality: Right;  . ORIF PATELLA Right 06/09/2015   Procedure: OPEN REDUCTION INTERNAL (ORIF) FIXATION PATELLA;  Surgeon: Marybelle Killings, MD;  Location: [facility];  Service: Orthopedics;  Laterality: Right;  . repair of bowel obstruction  1999   Dr. Dalbert Batman  . repair of ventral hernia  04-20-1994   P. Cameron Sprang MD  . resection femoral artery  03/19/2012   Dr. Curt Jews  . SUBMUCOSAL INJECTION  05/04/2018   Procedure: SUBMUCOSAL INJECTION;  Surgeon: [name], MD;  Location: [facility];  Service: Gastroenterology;;    Current Hospital Medications:  Home Meds:  . Current Meds  Medication Sig  . acetaminophen (TYLENOL) 500 MG tablet Take 500 mg by mouth every 6 (six) hours as  needed for mild pain or headache.  . alendronate (FOSAMAX) 70 MG tablet Take 70 mg by mouth once a week. Saturday  . aspirin 81 MG tablet Take 81 mg by mouth daily.  . carboxymethylcellulose (REFRESH PLUS) 0.5 % SOLN Apply 2 drops to eye at bedtime.   . carvedilol (COREG) 12.5 MG tablet TAKE 1 TABLET BY MOUTH 2 TIMES DAILY WITH A MEAL (Patient taking differently: Take 12.5 mg by mouth 2 (two) times daily with a meal. )  . Cyanocobalamin (B-12 PO) Take 1,000 mcg by mouth daily.   Marland Kitchen docusate sodium (COLACE) 100 MG capsule Take 2 capsules (200 mg total) by mouth 2 (two) times daily.  . fenofibrate 160 MG tablet Take 1 tablet (160 mg total) by mouth daily.  . ferrous sulfate 325 (65 FE) MG tablet Take 325 mg by mouth daily with breakfast.  . loratadine (CLARITIN) 10 MG tablet Take 10 mg by mouth daily.   . Melatonin 3 MG TABS Take 3 mg by mouth at bedtime as needed (sleep).  . pantoprazole (PROTONIX) 40 MG tablet Take 40 mg by mouth daily.  . simvastatin (ZOCOR) 40 MG tablet Take 1 tablet (40 mg total) by mouth daily at 6 PM.  . sodium chloride (OCEAN) 0.65 % SOLN nasal spray Place 2 sprays into both nostrils 2 (two) times daily.    Scheduled Meds: . sterile water (preservative free)       Continuous Infusions: . sodium chloride irrigation     PRN Meds:.fentaNYL (SUBLIMAZE) injection  Allergies:  Allergies  Allergen Reactions  . Quinolones Other (See Comments)    unknown  . Adhesive [Tape] Itching and Rash  . Lactose Intolerance (Gi) Other (See Comments)    Gas, bloating, stomach indigestion     Family History  Problem Relation Age of Onset  . Cancer Mother   . Cancer Father   . Varicose Veins Father   . Cancer Sister   . Diabetes Sister   . Heart attack Sister     Social History:  reports that he quit smoking about 51 years ago. His smoking use included cigarettes. He quit after 20.00 years of use. He has never used smokeless tobacco. He reports that he does not drink alcohol  or use drugs.  ROS: A complete review of systems was performed.  All systems are negative except for pertinent findings as noted.  Physical Exam:  Vital signs in last 24 hours: Pulse Rate:  [76-85] 80 (01/21 1415) Resp:  [14-23] 14 (01/21 1415) BP: (124-190)/(76-125) 154/82 (01/21 1415) SpO2:  [95 %-100 %] 100 % (01/21 1415) Weight:  [79.4 kg] 79.4 kg (01/21 0959) Constitutional:  Alert and oriented, No acute distress Cardiovascular: Regular rate and rhythm Respiratory: Normal respiratory effort GI: Abdomen is soft, suprapubic tenderness and slight distention GU: 50f 3-way foley in place with cherry red output and CBIs running Lymphatic: No lymphadenopathy Neurologic: Grossly intact, no focal deficits Psychiatric: Normal  mood and affect  Laboratory Data:  Recent Labs    08/15/19 1010  WBC 11.8*  HGB 12.3*  HCT 39.4  PLT 312    Recent Labs    08/15/19 1010  NA 136  K 4.4  CL 104  GLUCOSE 125*  BUN 25*  CALCIUM 10.4*  CREATININE 1.10     Results for orders placed or performed during the hospital encounter of 08/15/19 (from the past 24 hour(s))  Basic metabolic panel     Status: Abnormal   Collection Time: 08/15/19 10:10 AM  Result Value Ref Range   Sodium 136 135 - 145 mmol/L   Potassium 4.4 3.5 - 5.1 mmol/L   Chloride 104 98 - 111 mmol/L   CO2 20 (L) 22 - 32 mmol/L   Glucose, Bld 125 (H) 70 - 99 mg/dL   BUN 25 (H) 8 - 23 mg/dL   Creatinine, Ser 1.10 0.61 - 1.24 mg/dL   Calcium 10.4 (H) 8.9 - 10.3 mg/dL   GFR calc non Af Amer 59 (L) >60 mL/min   GFR calc Af Amer >60 >60 mL/min   Anion gap 12 5 - 15  CBC with Differential     Status: Abnormal   Collection Time: 08/15/19 10:10 AM  Result Value Ref Range   WBC 11.8 (H) 4.0 - 10.5 K/uL   RBC 4.20 (L) 4.22 - 5.81 MIL/uL   Hemoglobin 12.3 (L) 13.0 - 17.0 g/dL   HCT 39.4 39.0 - 52.0 %   MCV 93.8 80.0 - 100.0 fL   MCH 29.3 26.0 - 34.0 pg   MCHC 31.2 30.0 - 36.0 g/dL   RDW 14.0 11.5 - 15.5 %   Platelets 312  150 - 400 K/uL   nRBC 0.0 0.0 - 0.2 %   Neutrophils Relative % 85 %   Neutro Abs 10.1 (H) 1.7 - 7.7 K/uL   Lymphocytes Relative 6 %   Lymphs Abs 0.7 0.7 - 4.0 K/uL   Monocytes Relative 6 %   Monocytes Absolute 0.7 0.1 - 1.0 K/uL   Eosinophils Relative 2 %   Eosinophils Absolute 0.2 0.0 - 0.5 K/uL   Basophils Relative 1 %   Basophils Absolute 0.1 0.0 - 0.1 K/uL   Immature Granulocytes 0 %   Abs Immature Granulocytes 0.05 0.00 - 0.07 K/uL  Urinalysis, Routine w reflex microscopic     Status: Abnormal   Collection Time: 08/15/19 10:52 AM  Result Value Ref Range   Color, Urine RED (A) YELLOW   APPearance TURBID (A) CLEAR   Specific Gravity, Urine  1.005 - 1.030    TEST NOT REPORTED DUE TO COLOR INTERFERENCE OF URINE PIGMENT   pH  5.0 - 8.0    TEST NOT REPORTED DUE TO COLOR INTERFERENCE OF URINE PIGMENT   Glucose, UA (A) NEGATIVE mg/dL    TEST NOT REPORTED DUE TO COLOR INTERFERENCE OF URINE PIGMENT   Hgb urine dipstick (A) NEGATIVE    TEST NOT REPORTED DUE TO COLOR INTERFERENCE OF URINE PIGMENT   Bilirubin Urine (A) NEGATIVE    TEST NOT REPORTED DUE TO COLOR INTERFERENCE OF URINE PIGMENT   Ketones, ur (A) NEGATIVE mg/dL    TEST NOT REPORTED DUE TO COLOR INTERFERENCE OF URINE PIGMENT   Protein, ur (A) NEGATIVE mg/dL    TEST NOT REPORTED DUE TO COLOR INTERFERENCE OF URINE PIGMENT   Nitrite (A) NEGATIVE    TEST NOT REPORTED DUE TO COLOR INTERFERENCE OF URINE PIGMENT   Leukocytes,Ua (A) NEGATIVE  TEST NOT REPORTED DUE TO COLOR INTERFERENCE OF URINE PIGMENT  Urinalysis, Microscopic (reflex)     Status: Abnormal   Collection Time: 08/15/19 10:52 AM  Result Value Ref Range   RBC / HPF >50 0 - 5 RBC/hpf   WBC, UA >50 0 - 5 WBC/hpf   Bacteria, UA RARE (A) NONE SEEN   Squamous Epithelial / LPF 0-5 0 - 5   No results found for this or any previous visit (from the past 240 hour(s)).  Renal Function: Recent Labs    08/15/19 1010  CREATININE 1.10   Estimated Creatinine Clearance:  50 mL/min (by C-G formula based on SCr of 1.1 mg/dL).  Radiologic Imaging: CT Renal Stone Study  Result Date: 08/15/2019 CLINICAL DATA:  With gross hematuria since last night, large amount of blood in urine, history prostate cancer 10 years ago, has been having trouble urinating EXAM: CT ABDOMEN AND PELVIS WITHOUT CONTRAST TECHNIQUE: Multidetector CT imaging of the abdomen and pelvis was performed following the standard protocol without IV contrast. Sagittal and coronal MPR images reconstructed from axial data set. No oral contrast administered for this indication COMPARISON:  05/04/2018 FINDINGS: Lower chest: Emphysematous changes at lung bases with bibasilar reticulonodular interstitial disease. No acute infiltrate or pleural effusion. Hepatobiliary: Multiple calcified gallstones within gallbladder. Liver unremarkable. Pancreas: Normal appearance Spleen: Normal appearance Adrenals/Urinary Tract: Adrenal glands normal appearance. Small BILATERAL renal cysts largest mid RIGHT kidney 3.7 x 3.1 cm. Mild dilatation of LEFT renal collecting system. With mild proximal periureteral edema. No urinary tract calcification or ureteral dilatation identified. Foley catheter within urinary bladder hemorrhage/clot. Stomach/Bowel: Stomach decompressed, suboptimal assessment of wall thickness. Large and small bowel loops unremarkable. Normal appendix. Vascular/Lymphatic: Extensive atherosclerotic changes of aorta, iliac arteries, visceral arteries. Abdominal aortic aneurysm 4.2 x 4.0 cm with prior aneurysm repair, with graft extending to LEFT common iliac artery and RIGHT common femoral artery. Mark aneurysmal dilatation of internal iliac arteries bilaterally, RIGHT 7.8 x 6.8 cm extending 8.8 cm length previously 7.3 x 6.9 cm extending 7.2 cm length and LEFT 6.2 x 5.3 cm previously 5.4 x 4.7 cm. 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 No evidence of aneurysm rupture/hemorrhage.  No adenopathy. Reproductive: Minimal prostatic enlargement. Other: No free air or free fluid. No hernia or acute inflammatory process. Musculoskeletal: Osseous demineralization with orthopedic hardware at the proximal femora bilaterally. Degenerative disc disease changes of the lumbar spine. IMPRESSION: Abdominal aortic aneurysm post repair. BILATERAL internal iliac artery aneurysms measuring up to 7.8 x 6.8 cm on RIGHT and 6.2 x 5.3 cm on LEFT, both slightly increased. High attenuation material in urinary bladder consistent with hematoma/clot, uncertain etiology. BILATERAL renal cysts with mild dilatation of the LEFT renal collecting system though no obstructing calculus is identified, question from UPJ obstruction. Cholelithiasis. 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 Aortic Atherosclerosis (ICD10-I70.0). Aortic aneurysm NOS (ICD10-I71.9). Electronically Signed   By: Lavonia Dana M.D.   On: 08/15/2019 13:48     Impression/Recommendation  Hematuria with clot retention--unclear etiology but possibilities include bladder wall/prostate irritation from infection or bladder tumor.  His tissues would be more friable due to hx of radiation. Send urine for culture.  Pt started on Rocephin by ED.  Continue CBIs until urine clear. If urine does not clear with irrigation he may require clot evacuation in OR.  He will need cystoscopy to r/o bladder tumor which could be performed as an outpt if he does not require clot  evac.    Pt should have his PSA checked as he is concerned about a return of his prostate cancer but this would be falsely elevated in the current setting.  It can be checked in a couple of weeks as an outpt.   Consider B/O suppository if bladder spasms persist.   Debbrah Alar 08/15/2019, 3:14 PM

## 2019-08-16 DIAGNOSIS — Z8249 Family history of ischemic heart disease and other diseases of the circulatory system: Secondary | ICD-10-CM | POA: Diagnosis not present

## 2019-08-16 DIAGNOSIS — D62 Acute posthemorrhagic anemia: Secondary | ICD-10-CM | POA: Diagnosis present

## 2019-08-16 DIAGNOSIS — H9190 Unspecified hearing loss, unspecified ear: Secondary | ICD-10-CM | POA: Diagnosis present

## 2019-08-16 DIAGNOSIS — Z7901 Long term (current) use of anticoagulants: Secondary | ICD-10-CM | POA: Diagnosis not present

## 2019-08-16 DIAGNOSIS — I4510 Unspecified right bundle-branch block: Secondary | ICD-10-CM | POA: Diagnosis present

## 2019-08-16 DIAGNOSIS — Z951 Presence of aortocoronary bypass graft: Secondary | ICD-10-CM | POA: Diagnosis not present

## 2019-08-16 DIAGNOSIS — N200 Calculus of kidney: Secondary | ICD-10-CM | POA: Diagnosis present

## 2019-08-16 DIAGNOSIS — R310 Gross hematuria: Secondary | ICD-10-CM | POA: Diagnosis present

## 2019-08-16 DIAGNOSIS — I2510 Atherosclerotic heart disease of native coronary artery without angina pectoris: Secondary | ICD-10-CM | POA: Diagnosis present

## 2019-08-16 DIAGNOSIS — E785 Hyperlipidemia, unspecified: Secondary | ICD-10-CM | POA: Diagnosis present

## 2019-08-16 DIAGNOSIS — Z833 Family history of diabetes mellitus: Secondary | ICD-10-CM | POA: Diagnosis not present

## 2019-08-16 DIAGNOSIS — Z8546 Personal history of malignant neoplasm of prostate: Secondary | ICD-10-CM | POA: Diagnosis not present

## 2019-08-16 DIAGNOSIS — I739 Peripheral vascular disease, unspecified: Secondary | ICD-10-CM | POA: Diagnosis present

## 2019-08-16 DIAGNOSIS — Z87891 Personal history of nicotine dependence: Secondary | ICD-10-CM | POA: Diagnosis not present

## 2019-08-16 DIAGNOSIS — M1990 Unspecified osteoarthritis, unspecified site: Secondary | ICD-10-CM | POA: Diagnosis present

## 2019-08-16 DIAGNOSIS — Z809 Family history of malignant neoplasm, unspecified: Secondary | ICD-10-CM | POA: Diagnosis not present

## 2019-08-16 DIAGNOSIS — Z923 Personal history of irradiation: Secondary | ICD-10-CM | POA: Diagnosis not present

## 2019-08-16 DIAGNOSIS — K219 Gastro-esophageal reflux disease without esophagitis: Secondary | ICD-10-CM | POA: Diagnosis present

## 2019-08-16 DIAGNOSIS — Z7982 Long term (current) use of aspirin: Secondary | ICD-10-CM | POA: Diagnosis not present

## 2019-08-16 DIAGNOSIS — N3041 Irradiation cystitis with hematuria: Secondary | ICD-10-CM | POA: Diagnosis present

## 2019-08-16 DIAGNOSIS — I252 Old myocardial infarction: Secondary | ICD-10-CM | POA: Diagnosis not present

## 2019-08-16 DIAGNOSIS — I5042 Chronic combined systolic (congestive) and diastolic (congestive) heart failure: Secondary | ICD-10-CM | POA: Diagnosis present

## 2019-08-16 DIAGNOSIS — Z8616 Personal history of COVID-19: Secondary | ICD-10-CM | POA: Diagnosis not present

## 2019-08-16 DIAGNOSIS — Z66 Do not resuscitate: Secondary | ICD-10-CM | POA: Diagnosis present

## 2019-08-16 DIAGNOSIS — I110 Hypertensive heart disease with heart failure: Secondary | ICD-10-CM | POA: Diagnosis present

## 2019-08-16 LAB — SURGICAL PCR SCREEN
MRSA, PCR: NEGATIVE
Staphylococcus aureus: NEGATIVE

## 2019-08-16 LAB — CBC
HCT: 34.5 % — ABNORMAL LOW (ref 39.0–52.0)
Hemoglobin: 11.1 g/dL — ABNORMAL LOW (ref 13.0–17.0)
MCH: 30.1 pg (ref 26.0–34.0)
MCHC: 32.2 g/dL (ref 30.0–36.0)
MCV: 93.5 fL (ref 80.0–100.0)
Platelets: 275 10*3/uL (ref 150–400)
RBC: 3.69 MIL/uL — ABNORMAL LOW (ref 4.22–5.81)
RDW: 14.3 % (ref 11.5–15.5)
WBC: 14.2 10*3/uL — ABNORMAL HIGH (ref 4.0–10.5)
nRBC: 0 % (ref 0.0–0.2)

## 2019-08-16 LAB — BASIC METABOLIC PANEL
Anion gap: 13 (ref 5–15)
BUN: 28 mg/dL — ABNORMAL HIGH (ref 8–23)
CO2: 21 mmol/L — ABNORMAL LOW (ref 22–32)
Calcium: 9.5 mg/dL (ref 8.9–10.3)
Chloride: 103 mmol/L (ref 98–111)
Creatinine, Ser: 1.05 mg/dL (ref 0.61–1.24)
GFR calc Af Amer: >60 mL/min (ref >60)
GFR calc non Af Amer: >60 mL/min (ref >60)
Glucose, Bld: 147 mg/dL — ABNORMAL HIGH (ref 70–99)
Potassium: 3.9 mmol/L (ref 3.5–5.1)
Sodium: 137 mmol/L (ref 135–145)

## 2019-08-16 MED ORDER — BELLADONNA ALKALOIDS-OPIUM 16.2-60 MG RE SUPP
1.0000 | Freq: Three times a day (TID) | RECTAL | Status: DC | PRN
  Administered 2019-08-16 – 2019-08-18 (×3): 1 via RECTAL
  Filled 2019-08-16 (×3): qty 1

## 2019-08-16 MED ORDER — CHLORHEXIDINE GLUCONATE CLOTH 2 % EX PADS
6.0000 | MEDICATED_PAD | Freq: Every day | CUTANEOUS | Status: DC
  Administered 2019-08-16 – 2019-08-18 (×3): 6 via TOPICAL

## 2019-08-16 MED ORDER — BELLADONNA ALKALOIDS-OPIUM 16.2-60 MG RE SUPP
1.0000 | Freq: Once | RECTAL | Status: AC | PRN
  Administered 2019-08-16: 04:00:00 1 via RECTAL
  Filled 2019-08-16: qty 1

## 2019-08-16 MED ORDER — HYDROMORPHONE HCL 1 MG/ML IJ SOLN
INTRAMUSCULAR | Status: AC
  Filled 2019-08-16: qty 1

## 2019-08-16 MED ORDER — POLYVINYL ALCOHOL 1.4 % OP SOLN
2.0000 [drp] | Freq: Every day | OPHTHALMIC | Status: DC
  Administered 2019-08-16 – 2019-08-20 (×5): 2 [drp] via OPHTHALMIC
  Filled 2019-08-16: qty 15

## 2019-08-16 MED ORDER — SODIUM CHLORIDE 0.9 % IR SOLN
3000.0000 mL | Status: DC
  Administered 2019-08-16 (×4): 3000 mL

## 2019-08-16 MED ORDER — SODIUM CHLORIDE 0.9 % IR SOLN: 1000.0000 mL | Status: DC | PRN

## 2019-08-16 MED ORDER — HYDROMORPHONE HCL 1 MG/ML IJ SOLN
1.0000 mg | Freq: Once | INTRAMUSCULAR | Status: AC
  Administered 2019-08-16: 04:00:00 1 mg via INTRAVENOUS

## 2019-08-16 NOTE — Progress Notes (Signed)
Subjective: Cebastian had a couple of episodes of catheter obstruction with pain overnight that required hand irrigation with return of clots.  His Hgb is down from 12.3 to 11.1 overnight.  The urine was very light pink on slow CBI when I came in but bloodied up with manipulation of the irrigation rate.   ROS:  Review of Systems  All other systems reviewed and are negative.   Anti-infectives: Anti-infectives (From admission, onward)   Start     Dose/Rate Route Frequency Ordered Stop   08/16/19 1500  cefTRIAXone (ROCEPHIN) 1 g in sodium chloride 0.9 % 100 mL IVPB     1 g 200 mL/hr over 30 Minutes Intravenous Every 24 hours 08/15/19 1553     08/15/19 1245  cefTRIAXone (ROCEPHIN) 1 g in sodium chloride 0.9 % 100 mL IVPB     1 g 200 mL/hr over 30 Minutes Intravenous  Once 08/15/19 1230 08/15/19 1515      Current Facility-Administered Medications  Medication Dose Route Frequency Provider Last Rate Last Admin  . acetaminophen (TYLENOL) tablet 650 mg  650 mg Oral Q6H PRN Karmen Bongo, MD       Or  . acetaminophen (TYLENOL) suppository 650 mg  650 mg Rectal Q6H PRN Karmen Bongo, MD      . carvedilol (COREG) tablet 12.5 mg  12.5 mg Oral BID WC Karmen Bongo, MD      . cefTRIAXone (ROCEPHIN) 1 g in sodium chloride 0.9 % 100 mL IVPB  1 g Intravenous Q24H Karmen Bongo, MD      . Chlorhexidine Gluconate Cloth 2 % PADS 6 each  6 each Topical Daily Karmen Bongo, MD      . docusate sodium (COLACE) capsule 100 mg  100 mg Oral BID Karmen Bongo, MD   100 mg at 08/15/19 2230  . fentaNYL (SUBLIMAZE) injection 25 mcg  25 mcg Intravenous Q2H PRN Karmen Bongo, MD   25 mcg at 08/16/19 0319  . hydroxypropyl methylcellulose / hypromellose (ISOPTO TEARS / GONIOVISC) 2.5 % ophthalmic solution 2 drop  2 drop Both Eyes QHS Karmen Bongo, MD   2 drop at 08/15/19 2232  . loratadine (CLARITIN) tablet 10 mg  10 mg Oral Daily Karmen Bongo, MD      . Melatonin TABS 3 mg  3 mg Oral QHS PRN Karmen Bongo, MD      . ondansetron [facility]) tablet 4 mg  4 mg Oral Q6H PRN Karmen Bongo, MD       Or  . ondansetron [facility]) injection 4 mg  4 mg Intravenous Q6H PRN Karmen Bongo, MD      . pantoprazole (PROTONIX) EC tablet 40 mg  40 mg Oral Daily Karmen Bongo, MD      . simvastatin (ZOCOR) tablet 40 mg  40 mg Oral q1800 Karmen Bongo, MD      . sodium chloride (OCEAN) 0.65 % nasal spray 2 spray  2 spray Each Nare BID Karmen Bongo, MD   2 spray at 08/15/19 2238  . sodium chloride irrigation 0.9 % 1,000 mL  1,000 mL Irrigation PRN Irine Seal, MD      . sodium chloride irrigation 0.9 % 3,000 mL  3,000 mL Irrigation Continuous Karmen Bongo, MD   3,000 mL at 08/15/19 1226     Objective: Vital signs in last 24 hours: Temp:  [98 F (36.7 C)-98.2 F (36.8 C)] 98.1 F (36.7 C) (01/22 0512) Pulse Rate:  [76-102] 81 (01/22 0512) Resp:  [14-28] 20 (01/22 0512) BP: (112-190)/(61-125)  114/61 (01/22 0512) SpO2:  [95 %-100 %] 96 % (01/22 0512) Weight:  [79.4 kg] 79.4 kg (01/21 0959)  Intake/Output from previous day: 01/21 0701 - 01/22 0700 In: -  Out: 10900 [id number] Intake/Output this shift: No intake/output data recorded.   Physical Exam Vitals reviewed.  Constitutional:      Appearance: Normal appearance.  Genitourinary:    Comments: Urine pink in foley tubing.   Neurological:     Mental Status: He is alert.     Lab Results:  Recent Labs    08/15/19 1010 08/16/19 0444  WBC 11.8* 14.2*  HGB 12.3* 11.1*  HCT 39.4 34.5*  PLT 312 275   BMET Recent Labs    08/15/19 1010 08/16/19 0444  NA 136 137  K 4.4 3.9  CL 104 103  CO2 20* 21*  GLUCOSE 125* 147*  BUN 25* 28*  CREATININE 1.10 1.05  CALCIUM 10.4* 9.5   PT/INR No results for input(s): LABPROT, INR in the last 72 hours. ABG No results for input(s): PHART, HCO3 in the last 72 hours.  Invalid input(s): PCO2, PO2  Studies/Results: CT Renal Stone Study  Result Date: 08/15/2019 CLINICAL DATA:   With gross hematuria since last night, large amount of blood in urine, history prostate cancer 10 years ago, has been having trouble urinating EXAM: CT ABDOMEN AND PELVIS WITHOUT CONTRAST TECHNIQUE: Multidetector CT imaging of the abdomen and pelvis was performed following the standard protocol without IV contrast. Sagittal and coronal MPR images reconstructed from axial data set. No oral contrast administered for this indication COMPARISON:  05/04/2018 FINDINGS: Lower chest: Emphysematous changes at lung bases with bibasilar reticulonodular interstitial disease. No acute infiltrate or pleural effusion. Hepatobiliary: Multiple calcified gallstones within gallbladder. Liver unremarkable. Pancreas: Normal appearance Spleen: Normal appearance Adrenals/Urinary Tract: Adrenal glands normal appearance. Small BILATERAL renal cysts largest mid RIGHT kidney 3.7 x 3.1 cm. Mild dilatation of LEFT renal collecting system. With mild proximal periureteral edema. No urinary tract calcification or ureteral dilatation identified. Foley catheter within urinary bladder hemorrhage/clot. Stomach/Bowel: Stomach decompressed, suboptimal assessment of wall thickness. Large and small bowel loops unremarkable. Normal appendix. Vascular/Lymphatic: Extensive atherosclerotic changes of aorta, iliac arteries, visceral arteries. Abdominal aortic aneurysm 4.2 x 4.0 cm with prior aneurysm repair, with graft extending to LEFT common iliac artery and RIGHT common femoral artery. Mark aneurysmal dilatation of internal iliac arteries bilaterally, RIGHT 7.8 x 6.8 cm extending 8.8 cm length previously 7.3 x 6.9 cm extending 7.2 cm length and LEFT 6.2 x 5.3 cm previously 5.4 x 4.7 cm. 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 No evidence of aneurysm rupture/hemorrhage. No adenopathy. Reproductive: Minimal prostatic enlargement. Other: No free air or free fluid. No hernia or acute inflammatory process.  Musculoskeletal: Osseous demineralization with orthopedic hardware at the proximal femora bilaterally. Degenerative disc disease changes of the lumbar spine. IMPRESSION: Abdominal aortic aneurysm post repair. BILATERAL internal iliac artery aneurysms measuring up to 7.8 x 6.8 cm on RIGHT and 6.2 x 5.3 cm on LEFT, both slightly increased. High attenuation material in urinary bladder consistent with hematoma/clot, uncertain etiology. BILATERAL renal cysts with mild dilatation of the LEFT renal collecting system though no obstructing calculus is identified, question from UPJ obstruction. Cholelithiasis. 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 Aortic Atherosclerosis (ICD10-I70.0). Aortic aneurysm NOS (ICD10-I71.9). Electronically Signed   By: Lavonia Dana M.D.   On: 08/15/2019 13:48   Labs reviewed.     Assessment and Plan: Clot retention  with recurrent obstruction.   I have ordered hand irrigation with NS to maintain catheter patency and will otherwise continue CBI.   Maintain NPO status as he may need to go to the OR for cystoscopy and fulgurationi today.  Acute blood loss anemia.  His Hgb is down to 11.1.       LOS: 0 days    Irine Seal 08/16/2019 [id number] ID: [name], male   DOB: 20-Jan-1928, 86 y.o.   MRN: [id number]

## 2019-08-16 NOTE — Progress Notes (Addendum)
Paged [facility]. Pt got no relief from B&O suppository and Dilaudid. No urine in catheter now. Bloody drainage leaking around catheter. Per [employer], page on call for urology

## 2019-08-16 NOTE — Progress Notes (Signed)
Pt discharged to [facility] long via Biomedical [facility] by [facility], all personal belongings given.

## 2019-08-16 NOTE — Progress Notes (Signed)
Paged [facility]. Pt still crying in pain

## 2019-08-16 NOTE — Progress Notes (Signed)
Im trying to give report to [place] [facility] [place] [id number] regarding transfer, charge nurse will give me a call back.

## 2019-08-16 NOTE — Progress Notes (Signed)
Pt for transfer to [place]  To [place] room 4105 report given to [place], pt alert and oriented, room air, covid + last 2 months ago, with foley 3 way for bladder irrigation brownish color, with left AC saline locked . NPO, no complained of pain at this time, given report to [facility].

## 2019-08-16 NOTE — Progress Notes (Addendum)
PROGRESS NOTE    Jesse [name]  [id number] DOB: [birth year] DOA: 08/15/2019 PCP: [name], MD   Brief Narrative:  HPI: Jesse [name] is a 86 y.o. male with medical history significant of CAD; nephrolithiasis; HTN; HLD; CAD s/p CABG; mild dementia; AAA; COVID-19 infection in 05/2019; and remote prostate cancer treated with XRT presenting with gross hematuria.  He reports that last night he noticed acute onset of gross hematuria with associated pelvic pain and cramping.  He has continued to have hematuria through the day today and so his ALF decided to have him sent in to the ER.  No symptoms prior to onset yesterday.  He had a left hip fracture with repair in September and a right hip fracture with repair in November.  At that time, he was found to be COVID-19 positive and was treated with remdesivir at [facility].   ED Course:  Gross hematuria since yesterday.  Foley placed with CBI.  Dr. Jeffie Pollock will come see patient - planning to take him to the OR for washout.  Assessment & Plan:   Principal Problem:   Gross hematuria Active Problems:   HLD (hyperlipidemia)   Essential hypertension   PERIPHERAL VASCULAR DISEASE   Chronic systolic heart failure ([place])   COVID-19 virus infection   History of prostate cancer   Gross hematuria/acute blood loss anemia: Status post placement of Foley in ED with CBI.  Urology managing.  Continues to have clots which are obstructing catheter at times requiring hand irrigation.  Hemoglobin dropped slightly.  No indication of transfusion.  Monitor H&H every 12 hours.  Urology on board and managing and appreciate their help.  Urology requests transfer to [facility] so they can monitor him closely and attend to emergent procedure if needed.  Will transfer to [facility].  Continue to hold aspirin.  Patient remains n.p.o.  UTI: Continue Rocephin and follow culture.  Tailor antibiotics accordingly.  PVD-Patient has multiple known aneurysms including AAA s/p  repair -CT today shows slightly increased size of B internal iliac artery aneurysms -He is due for annual CT surgery f/u in June, could move up sooner if desired but currently there does not appear to be an indication for inpatient evaluation -Resume ASA when able  H/o prostate CA -Appears to have completed outpatient treatment in 2010 -He may have increased prostatic friability associated with prior rad treatment -Will defer to urology  Essential hypertension: Fairly controlled with some elevations here and there.  Continue carvedilol and monitor.  HLD -Continue Zocor -Hold fenofibrate due to limited inpatient utility  Chronic combined CHF -05/2018 echo with EF 40-45% and grade 1 diastolic dysfunction -Appears to be compensated at this time -Will need to monitor for evidence of volume overload  H/o COVID-19 infection -Patient with prior documented infection on 11/21 -He remains within the 90-day window and so should not be retested, or test results will not change management - he does not require precautions at this time regardless of his test results  DVT prophylaxis: SCD. Code Status: DNR Family Communication:  None present at bedside.  Plan of care discussed with patient in length and he verbalized understanding and agreed with it. Disposition Plan: Transferring to [facility] long hospital per urology recommendation.  Estimated body mass index is 23.73 kg/m as calculated from the following:   Height as of 06/16/19: 6' (1.829 m).   Weight as of this encounter: 79.4 kg.      Nutritional status:  Consultants:   Urology  Procedures:   None  Antimicrobials:   Rocephin   Subjective: Patient seen and examined.  Feels better.  No new complaint.  Objective: Vitals:   08/15/19 1636 08/15/19 2110 08/16/19 0329 08/16/19 0512  BP: 122/85 112/68 (!) 176/115 114/61  Pulse: 83 95 (!) 102 81  Resp: 16 18 (!) 28 20  Temp:  98.2 F (36.8 C) 98 F  (36.7 C) 98.1 F (36.7 C)  TempSrc:  Oral Oral Oral  SpO2: 97% 98% 99% 96%  Weight:        Intake/Output Summary (Last 24 hours) at 08/16/2019 1108 Last data filed at 08/16/2019 1000 Gross per 24 hour  Intake 0 ml  Output 12600 ml  Net -12600 ml   Filed Weights   08/15/19 0959  Weight: 79.4 kg    Examination:  General exam: Appears calm and comfortable  Respiratory system: Clear to auscultation. Respiratory effort normal. Cardiovascular system: S1 & S2 heard, RRR. No JVD, murmurs, rubs, gallops or clicks. No pedal edema. Gastrointestinal system: Abdomen is nondistended, soft and nontender. No organomegaly or masses felt. Normal bowel sounds heard. Central nervous system: Alert and oriented. No focal neurological deficits. Extremities: Symmetric 5 x 5 power. Skin: No rashes, lesions or ulcers Psychiatry: Judgement and insight appear normal. Mood & affect appropriate.    Data Reviewed: I have personally reviewed following labs and imaging studies  CBC: Recent Labs  Lab 08/15/19 1010 08/16/19 0444  WBC 11.8* 14.2*  NEUTROABS 10.1*  --   HGB 12.3* 11.1*  HCT 39.4 34.5*  MCV 93.8 93.5  PLT 312 [id number]   Basic Metabolic Panel: Recent Labs  Lab 08/15/19 1010 08/16/19 0444  NA 136 137  K 4.4 3.9  CL 104 103  CO2 20* 21*  GLUCOSE 125* 147*  BUN 25* 28*  CREATININE 1.10 1.05  CALCIUM 10.4* 9.5   GFR: Estimated Creatinine Clearance: 52.3 mL/min (by C-G formula based on SCr of 1.05 mg/dL). Liver Function Tests: No results for input(s): AST, ALT, ALKPHOS, BILITOT, PROT, ALBUMIN in the last 168 hours. No results for input(s): LIPASE, AMYLASE in the last 168 hours. No results for input(s): AMMONIA in the last 168 hours. Coagulation Profile: No results for input(s): INR, PROTIME in the last 168 hours. Cardiac Enzymes: No results for input(s): CKTOTAL, CKMB, CKMBINDEX, TROPONINI in the last 168 hours. BNP (last 3 results) No results for input(s): PROBNP in the last  8760 hours. HbA1C: No results for input(s): HGBA1C in the last 72 hours. CBG: No results for input(s): GLUCAP in the last 168 hours. Lipid Profile: No results for input(s): CHOL, HDL, LDLCALC, TRIG, CHOLHDL, LDLDIRECT in the last 72 hours. Thyroid Function Tests: No results for input(s): TSH, T4TOTAL, FREET4, T3FREE, THYROIDAB in the last 72 hours. Anemia Panel: No results for input(s): VITAMINB12, FOLATE, FERRITIN, TIBC, IRON, RETICCTPCT in the last 72 hours. Sepsis Labs: No results for input(s): PROCALCITON, LATICACIDVEN in the last 168 hours.  Recent Results (from the past 240 hour(s))  Surgical pcr screen     Status: None   Collection Time: 08/16/19  2:42 AM   Specimen: Nasal Mucosa; Nasal Swab  Result Value Ref Range Status   MRSA, PCR NEGATIVE NEGATIVE Final   Staphylococcus aureus NEGATIVE NEGATIVE Final    Comment: (NOTE) The Xpert SA Assay (FDA approved for NASAL specimens in patients 84 years of age and older), is one component of a comprehensive surveillance program. It is not intended to diagnose infection nor to guide  or monitor treatment. Performed at [facility], [place] [address] [place], [place] [address]       Radiology Studies: CT Renal Stone Study  Result Date: 08/15/2019 CLINICAL DATA:  With gross hematuria since last night, large amount of blood in urine, history prostate cancer 10 years ago, has been having trouble urinating EXAM: CT ABDOMEN AND PELVIS WITHOUT CONTRAST TECHNIQUE: Multidetector CT imaging of the abdomen and pelvis was performed following the standard protocol without IV contrast. Sagittal and coronal MPR images reconstructed from axial data set. No oral contrast administered for this indication COMPARISON:  05/04/2018 FINDINGS: Lower chest: Emphysematous changes at lung bases with bibasilar reticulonodular interstitial disease. No acute infiltrate or pleural effusion. Hepatobiliary: Multiple calcified gallstones within gallbladder. Liver  unremarkable. Pancreas: Normal appearance Spleen: Normal appearance Adrenals/Urinary Tract: Adrenal glands normal appearance. Small BILATERAL renal cysts largest mid RIGHT kidney 3.7 x 3.1 cm. Mild dilatation of LEFT renal collecting system. With mild proximal periureteral edema. No urinary tract calcification or ureteral dilatation identified. Foley catheter within urinary bladder hemorrhage/clot. Stomach/Bowel: Stomach decompressed, suboptimal assessment of wall thickness. Large and small bowel loops unremarkable. Normal appendix. Vascular/Lymphatic: Extensive atherosclerotic changes of aorta, iliac arteries, visceral arteries. Abdominal aortic aneurysm 4.2 x 4.0 cm with prior aneurysm repair, with graft extending to LEFT common iliac artery and RIGHT common femoral artery. Mark aneurysmal dilatation of internal iliac arteries bilaterally, RIGHT 7.8 x 6.8 cm extending 8.8 cm length previously 7.3 x 6.9 cm extending 7.2 cm length and LEFT 6.2 x 5.3 cm previously 5.4 x 4.7 cm. 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 No evidence of aneurysm rupture/hemorrhage. No adenopathy. Reproductive: Minimal prostatic enlargement. Other: No free air or free fluid. No hernia or acute inflammatory process. Musculoskeletal: Osseous demineralization with orthopedic hardware at the proximal femora bilaterally. Degenerative disc disease changes of the lumbar spine. IMPRESSION: Abdominal aortic aneurysm post repair. BILATERAL internal iliac artery aneurysms measuring up to 7.8 x 6.8 cm on RIGHT and 6.2 x 5.3 cm on LEFT, both slightly increased. High attenuation material in urinary bladder consistent with hematoma/clot, uncertain etiology. BILATERAL renal cysts with mild dilatation of the LEFT renal collecting system though no obstructing calculus is identified, question from UPJ obstruction. Cholelithiasis. 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 Aortic  Atherosclerosis (ICD10-I70.0). Aortic aneurysm NOS (ICD10-I71.9). Electronically Signed   By: Lavonia Dana M.D.   On: 08/15/2019 13:48    Scheduled Meds: . carvedilol  12.5 mg Oral BID WC  . Chlorhexidine Gluconate Cloth  6 each Topical Daily  . docusate sodium  100 mg Oral BID  . hydroxypropyl methylcellulose / hypromellose  2 drop Both Eyes QHS  . loratadine  10 mg Oral Daily  . pantoprazole  40 mg Oral Daily  . simvastatin  40 mg Oral q1800  . sodium chloride  2 spray Each Nare BID   Continuous Infusions: . cefTRIAXone (ROCEPHIN)  IV    . sodium chloride irrigation       LOS: 0 days   Time spent: 35 minutes   Darliss Cheney, MD [facility]  08/16/2019, 11:08 AM   To contact the attending provider between 7A-7P or the covering provider during after hours 7P-7A, please log into the web site [email or web address].

## 2019-08-16 NOTE — Progress Notes (Signed)
Spoke with Daine Gravel PA on call for urology. Informed her of pt's pain and that no urine is in catheter now and there is bloody drainage leaking from urethra. Bed pad underneath patient has been changed 3 times in last 30-40 minutes due to bloody drainage. Daine Gravel will inform Dr. Jeffie Pollock of events.  Stop CBI for now. Spoke with Dr. Jeffie Pollock. Received order to irrigate catheter with 30-60cc Sterile saline after trying to express clots from catheter. Expressed clots and 350ccbloody urine from catheter and instilled 30 cc sterile saline. Catheter drained another 350cc bloody urine after hand irrigation.

## 2019-08-16 NOTE — Progress Notes (Signed)
Irrigated foley with 75cc NS. Several clots removed. Foley draining without difficulty after irrigation. Becci Batty, Laurel Dimmer, RN

## 2019-08-16 NOTE — Plan of Care (Signed)
  Problem: Education: Goal: Knowledge of General Education information will improve Description: Including pain rating scale, medication(s)/side effects and non-pharmacologic comfort measures 08/16/2019 0758 by Fransisco Hertz, RN Outcome: Progressing 08/16/2019 0757 by Fransisco Hertz, RN Outcome: Progressing

## 2019-08-17 ENCOUNTER — Other Ambulatory Visit: Payer: Self-pay

## 2019-08-17 LAB — URINE CULTURE: Culture: >=100000 — AB

## 2019-08-17 MED ORDER — SODIUM CHLORIDE 0.9 % IV SOLN
2.0000 g | Freq: Four times a day (QID) | INTRAVENOUS | Status: DC
  Administered 2019-08-17 – 2019-08-21 (×16): 2 g via INTRAVENOUS
  Filled 2019-08-17 (×14): qty 2
  Filled 2019-08-17 (×2): qty 2000
  Filled 2019-08-17 (×2): qty 2

## 2019-08-17 NOTE — Progress Notes (Addendum)
PROGRESS NOTE  Jesse [name] [id number] DOB: 10-09-1927 DOA: 08/15/2019 PCP: [name], MD  HPI/Recap of past 24 hours: HPI:Jesse W Sharpeis a 86 y.o.malewith medical history significant ofCAD; nephrolithiasis; HTN; HLD; CAD s/p CABG;mild dementia;AAA; COVID-19 infection in 05/2019; and remote prostate cancertreated with XRTpresenting with gross hematuria. He reports that last night he noticed acute onset of gross hematuria with associated pelvic pain and cramping. He has continued to have hematuria through the day today and so his ALF decided to have him sent in to the ER. No symptoms prior to onset yesterday.  He had a left hip fracture with repair in September and a right hip fracture with repair in November. At that time, he was found to be COVID-19 positive and was treated with remdesivir at [facility].  Subjective: Patient seen and examined at bedside he still has his Foley catheter with irrigation  Assessment/Plan: Principal Problem:   Gross hematuria Active Problems:   HLD (hyperlipidemia)   Essential hypertension   PERIPHERAL VASCULAR DISEASE   Chronic systolic heart failure ([place])   COVID-19 virus infection   History of prostate cancer  Gross hematuria/acute blood loss anemia: Status post placement of Foley in ED  Status post Foley placement in the emergency department.  Urology is managing.  He has had multiple irrigation  Continue to hold aspirin.    Patient is for procedure tomorrow  UTI:  We will continue Rocephin and tailor antibiotics according to culture    PVD-patient has multiple known aneurysm including AAA SP repair CT scan shows slight increase in the size of be internal iliac artery aneurysm.    H/o prostate CA Urology is following-   Essential hypertension:  Fairly controlled we will continue carvedilol and to monitor.   HLD -His fenofibrate was held due to limited inpatient utility.  We will continue Zocor   Chronic combined  CHF -Patient has grade 1 diastolic dysfunction according to an echocardiogram done January 2019 which shows ejection fraction of 40 to 55% We will continue to monitor for evidence of overload volume overload   H/o COVID-19 infection -Patient had recent infection on November 2020.  He received treatments his repeat test on this admission negative for both PCR and rapid test for Covid    Code Status: DNR  Severity of Illness: The appropriate patient status for this patient is INPATIENT. Inpatient status is judged to be reasonable and necessary in order to provide the required intensity of service to ensure the patient's safety. The patient's presenting symptoms, physical exam findings, and initial radiographic and laboratory data in the context of their chronic comorbidities is felt to place them at high risk for further clinical deterioration. Furthermore, it is not anticipated that the patient will be medically stable for discharge from the hospital within 2 midnights of admission. The following factors support the patient status of inpatient.   " Patient was admitted with gross hematuria currently has a Foley catheter needing irrigation.  Urology was consulted he is scheduled to do bladder fulguration, tomorrow* I certify that at the point of admission it is my clinical judgment that the patient will require inpatient hospital care spanning beyond 2 midnights from the point of admission due to high intensity of service, high risk for further deterioration and high frequency of surveillance required.*    Family Communication: None at bedside  Disposition Plan: Home when stable   Consultants:  Urology  Procedures:  Bladder irrigation  Antimicrobials:  Rocephin  DVT prophylaxis: SCD  Objective: Vitals:   08/16/19 1846 08/16/19 2107 08/17/19 0525 08/17/19 0606  BP: 134/82 131/85 140/82 140/82  Pulse: 70 69 84 84  Resp: 18 18 16 16   Temp: 98.2 F (36.8 C) 99 F (37.2 C)  97.7 F (36.5 C) 97.7 F (36.5 C)  TempSrc: Oral Oral Oral Oral  SpO2: 96% 96% 97%   Weight:    78.1 kg  Height:    6' (1.829 m)    Intake/Output Summary (Last 24 hours) at 08/17/2019 0911 Last data filed at 08/17/2019 0739 Gross per 24 hour  Intake 9826 ml  Output 20250 ml  Net -10424 ml   Filed Weights   08/15/19 0959 08/17/19 0606  Weight: 79.4 kg 78.1 kg   Body mass index is 23.35 kg/m.  Exam:  . General: 86 y.o. year-old male well developed well nourished in no acute distress.  Alert and oriented x3. . Cardiovascular: Regular rate and rhythm with no rubs or gallops.  No thyromegaly or JVD noted.   Marland Kitchen Respiratory: Clear to auscultation with no wheezes or rales. Good inspiratory effort. . Abdomen: Soft nontender nondistended with normal bowel sounds x4 quadrants. . Musculoskeletal: No lower extremity edema. 2/4 pulses in all 4 extremities. . Skin: No ulcerative lesions noted or rashes, . Psychiatry: Mood is appropriate for condition and setting . GU indwelling Foley catheter with bag slightly dark urine noted no frank blood    Data Reviewed: CBC: Recent Labs  Lab 08/15/19 1010 08/16/19 0444  WBC 11.8* 14.2*  NEUTROABS 10.1*  --   HGB 12.3* 11.1*  HCT 39.4 34.5*  MCV 93.8 93.5  PLT 312 [id number]   Basic Metabolic Panel: Recent Labs  Lab 08/15/19 1010 08/16/19 0444  NA 136 137  K 4.4 3.9  CL 104 103  CO2 20* 21*  GLUCOSE 125* 147*  BUN 25* 28*  CREATININE 1.10 1.05  CALCIUM 10.4* 9.5   GFR: Estimated Creatinine Clearance: 52.3 mL/min (by C-G formula based on SCr of 1.05 mg/dL). Liver Function Tests: No results for input(s): AST, ALT, ALKPHOS, BILITOT, PROT, ALBUMIN in the last 168 hours. No results for input(s): LIPASE, AMYLASE in the last 168 hours. No results for input(s): AMMONIA in the last 168 hours. Coagulation Profile: No results for input(s): INR, PROTIME in the last 168 hours. Cardiac Enzymes: No results for input(s): CKTOTAL, CKMB, CKMBINDEX,  TROPONINI in the last 168 hours. BNP (last 3 results) No results for input(s): PROBNP in the last 8760 hours. HbA1C: No results for input(s): HGBA1C in the last 72 hours. CBG: No results for input(s): GLUCAP in the last 168 hours. Lipid Profile: No results for input(s): CHOL, HDL, LDLCALC, TRIG, CHOLHDL, LDLDIRECT in the last 72 hours. Thyroid Function Tests: No results for input(s): TSH, T4TOTAL, FREET4, T3FREE, THYROIDAB in the last 72 hours. Anemia Panel: No results for input(s): VITAMINB12, FOLATE, FERRITIN, TIBC, IRON, RETICCTPCT in the last 72 hours. Urine analysis:    Component Value Date/Time   COLORURINE RED (A) 08/15/2019 1052   APPEARANCEUR TURBID (A) 08/15/2019 1052   LABSPEC  08/15/2019 1052    TEST NOT REPORTED DUE TO COLOR INTERFERENCE OF URINE PIGMENT   PHURINE  08/15/2019 1052    TEST NOT REPORTED DUE TO COLOR INTERFERENCE OF URINE PIGMENT   GLUCOSEU (A) 08/15/2019 1052    TEST NOT REPORTED DUE TO COLOR INTERFERENCE OF URINE PIGMENT   HGBUR (A) 08/15/2019 1052    TEST NOT REPORTED DUE TO COLOR INTERFERENCE OF URINE PIGMENT   BILIRUBINUR (A)  08/15/2019 1052    TEST NOT REPORTED DUE TO COLOR INTERFERENCE OF URINE PIGMENT   KETONESUR (A) 08/15/2019 1052    TEST NOT REPORTED DUE TO COLOR INTERFERENCE OF URINE PIGMENT   PROTEINUR (A) 08/15/2019 1052    TEST NOT REPORTED DUE TO COLOR INTERFERENCE OF URINE PIGMENT   UROBILINOGEN 2.0 (H) 05/28/2015 0029   NITRITE (A) 08/15/2019 1052    TEST NOT REPORTED DUE TO COLOR INTERFERENCE OF URINE PIGMENT   LEUKOCYTESUR (A) 08/15/2019 1052    TEST NOT REPORTED DUE TO COLOR INTERFERENCE OF URINE PIGMENT   Sepsis Labs: @LABRCNTIP (procalcitonin:4,lacticidven:4)  ) Recent Results (from the past 240 hour(s))  Urine culture     Status: Abnormal   Collection Time: 08/15/19 10:52 AM   Specimen: Urine, Random  Result Value Ref Range Status   Specimen Description URINE, RANDOM  Final   Special Requests   Final    NONE Performed  at [facility], [address], [place], [place] [address]    Culture >=100,000 COLONIES/mL ENTEROCOCCUS FAECALIS (A)  Final   Report Status 08/17/2019 FINAL  Final   Organism ID, Bacteria ENTEROCOCCUS FAECALIS (A)  Final      Susceptibility   Enterococcus faecalis - MIC*    AMPICILLIN <=2 SENSITIVE Sensitive     NITROFURANTOIN <=16 SENSITIVE Sensitive     VANCOMYCIN 1 SENSITIVE Sensitive     * >=100,000 COLONIES/mL ENTEROCOCCUS FAECALIS  Surgical pcr screen     Status: None   Collection Time: 08/16/19  2:42 AM   Specimen: Nasal Mucosa; Nasal Swab  Result Value Ref Range Status   MRSA, PCR NEGATIVE NEGATIVE Final   Staphylococcus aureus NEGATIVE NEGATIVE Final    Comment: (NOTE) The Xpert SA Assay (FDA approved for NASAL specimens in patients 51 years of age and older), is one component of a comprehensive surveillance program. It is not intended to diagnose infection nor to guide or monitor treatment. Performed at [facility], [place] [address], [place], [place] [address]       Studies: No results found.  Scheduled Meds: . carvedilol  12.5 mg Oral BID WC  . Chlorhexidine Gluconate Cloth  6 each Topical Daily  . docusate sodium  100 mg Oral BID  . loratadine  10 mg Oral Daily  . pantoprazole  40 mg Oral Daily  . polyvinyl alcohol  2 drop Both Eyes QHS  . simvastatin  40 mg Oral q1800  . sodium chloride  2 spray Each Nare BID    Continuous Infusions: . cefTRIAXone (ROCEPHIN)  IV 1 g (08/16/19 1558)  . sodium chloride irrigation    . sodium chloride irrigation       LOS: 1 day     Cristal Deer, MD [facility]  To reach me or the doctor on call, go to: [email or web address] Password [email or web address]  08/17/2019, 9:11 AM

## 2019-08-17 NOTE — Treatment Plan (Signed)
Evaluated patient with CBI clamped. Urine more bloody, restarted CBI on slow drip. Will continue to watch today, NPO at MN for possible clo bladder fulguration tomorrow. Orders placed.

## 2019-08-17 NOTE — Progress Notes (Signed)
  Subjective: Catheter flushed x1 overnight this moment of clot removed.  Was on fast drip, slow to moderate drip this morning.  Initially clear but dark in with catheter manipulation, likely sediment in the bladder.  Flushed with 500 cc, no clots, urine only slight red which was reassuring.  L down and opium suppository helped with bladder spasms  Objective: Vital signs in last 24 hours: Temp:  [97.7 F (36.5 C)-99 F (37.2 C)] 97.7 F (36.5 C) (01/23 0606) Pulse Rate:  [69-84] 84 (01/23 0606) Resp:  [16-18] 16 (01/23 0606) BP: (123-140)/(81-85) 140/82 (01/23 0606) SpO2:  [96 %-98 %] 97 % (01/23 0525) Weight:  [78.1 kg] 78.1 kg (01/23 0606)  Intake/Output from previous day: 01/22 0701 - 01/23 0700 In: 9826 [IV Piggyback:1] Out: [id number] Intake/Output this shift: No intake/output data recorded.  Physical Exam:  General:alert, cooperative and appears stated age GU: No clots with hand irrigation  Lab Results: Recent Labs    08/15/19 1010 08/16/19 0444  HGB 12.3* 11.1*  HCT 39.4 34.5*   BMET Recent Labs    08/15/19 1010 08/16/19 0444  NA 136 137  K 4.4 3.9  CL 104 103  CO2 20* 21*  GLUCOSE 125* 147*  BUN 25* 28*  CREATININE 1.10 1.05  CALCIUM 10.4* 9.5   No results for input(s): LABPT, INR in the last 72 hours. No results for input(s): LABURIN in the last 72 hours.   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  CBI stopped this morning.  Will reevaluate in 2 hours 2.  Urine culture growing Enterococcus.  Recommended switching to ampicillin 3.  Keep n.p.o. until urine is reevaluated.  If urine darkens we may need to take him for cystoscopy/fulguration today    LOS: 1 day   Tharon Aquas 08/17/2019, 7:38 AM

## 2019-08-17 NOTE — Progress Notes (Signed)
Subjective: Urine lighter red on CBI. patint equired hand irrigation at transfer to [facility]> no Pain  Objective: Vital signs in last 24 hours: Temp:  [97.7 F (36.5 C)-99 F (37.2 C)] 97.7 F (36.5 C) (01/23 0606) Pulse Rate:  [69-84] 84 (01/23 0606) Resp:  [16-18] 16 (01/23 0606) BP: (123-140)/(81-85) 140/82 (01/23 0606) SpO2:  [96 %-98 %] 97 % (01/23 0525) Weight:  [78.1 kg] 78.1 kg (01/23 0606)  Intake/Output from previous day: 01/22 0701 - 01/23 0700 In: 9826 [IV Piggyback:1] Out: [id number] [Urine:19250] Intake/Output this shift: Total I/O In: 9000 [Other:9000] Out: 15000 [Urine:15000]  Physical Exam:  General:alert, cooperative and appears stated age GI: soft, non tender, normal bowel sounds, no palpable masses, no organomegaly, no inguinal hernia Male genitalia: not done Extremities: extremities normal, atraumatic, no cyanosis or edema  Lab Results: Recent Labs    08/15/19 1010 08/16/19 0444  HGB 12.3* 11.1*  HCT 39.4 34.5*   BMET Recent Labs    08/15/19 1010 08/16/19 0444  NA 136 137  K 4.4 3.9  CL 104 103  CO2 20* 21*  GLUCOSE 125* 147*  BUN 25* 28*  CREATININE 1.10 1.05  CALCIUM 10.4* 9.5   No results for input(s): LABPT, INR in the last 72 hours. No results for input(s): LABURIN in the last 72 hours. Results for orders placed or performed during the hospital encounter of 08/15/19  Urine culture     Status: Abnormal (Preliminary result)   Collection Time: 08/15/19 10:52 AM   Specimen: Urine, Random  Result Value Ref Range Status   Specimen Description URINE, RANDOM  Final   Special Requests   Final    NONE Performed at [facility], [address], [place], [place] [address]    Culture >=100,000 COLONIES/mL ENTEROCOCCUS FAECALIS (A)  Final   Report Status PENDING  Incomplete  Surgical pcr screen     Status: None   Collection Time: 08/16/19  2:42 AM   Specimen: Nasal Mucosa; Nasal Swab  Result Value Ref Range Status   MRSA, PCR NEGATIVE  NEGATIVE Final   Staphylococcus aureus NEGATIVE NEGATIVE Final    Comment: (NOTE) The Xpert SA Assay (FDA approved for NASAL specimens in patients 28 years of age and older), is one component of a comprehensive surveillance program. It is not intended to diagnose infection nor to guide or monitor treatment. Performed at [facility], [place] [address], [place], [place] [address]     Studies/Results: CT Renal Stone Study  Result Date: 08/15/2019 CLINICAL DATA:  With gross hematuria since last night, large amount of blood in urine, history prostate cancer 10 years ago, has been having trouble urinating EXAM: CT ABDOMEN AND PELVIS WITHOUT CONTRAST TECHNIQUE: Multidetector CT imaging of the abdomen and pelvis was performed following the standard protocol without IV contrast. Sagittal and coronal MPR images reconstructed from axial data set. No oral contrast administered for this indication COMPARISON:  05/04/2018 FINDINGS: Lower chest: Emphysematous changes at lung bases with bibasilar reticulonodular interstitial disease. No acute infiltrate or pleural effusion. Hepatobiliary: Multiple calcified gallstones within gallbladder. Liver unremarkable. Pancreas: Normal appearance Spleen: Normal appearance Adrenals/Urinary Tract: Adrenal glands normal appearance. Small BILATERAL renal cysts largest mid RIGHT kidney 3.7 x 3.1 cm. Mild dilatation of LEFT renal collecting system. With mild proximal periureteral edema. No urinary tract calcification or ureteral dilatation identified. Foley catheter within urinary bladder hemorrhage/clot. Stomach/Bowel: Stomach decompressed, suboptimal assessment of wall thickness. Large and small bowel loops unremarkable. Normal appendix. Vascular/Lymphatic: Extensive atherosclerotic changes  of aorta, iliac arteries, visceral arteries. Abdominal aortic aneurysm 4.2 x 4.0 cm with prior aneurysm repair, with graft extending to LEFT common iliac artery and RIGHT common femoral  artery. Mark aneurysmal dilatation of internal iliac arteries bilaterally, RIGHT 7.8 x 6.8 cm extending 8.8 cm length previously 7.3 x 6.9 cm extending 7.2 cm length and LEFT 6.2 x 5.3 cm previously 5.4 x 4.7 cm. 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 No evidence of aneurysm rupture/hemorrhage. No adenopathy. Reproductive: Minimal prostatic enlargement. Other: No free air or free fluid. No hernia or acute inflammatory process. Musculoskeletal: Osseous demineralization with orthopedic hardware at the proximal femora bilaterally. Degenerative disc disease changes of the lumbar spine. IMPRESSION: Abdominal aortic aneurysm post repair. BILATERAL internal iliac artery aneurysms measuring up to 7.8 x 6.8 cm on RIGHT and 6.2 x 5.3 cm on LEFT, both slightly increased. High attenuation material in urinary bladder consistent with hematoma/clot, uncertain etiology. BILATERAL renal cysts with mild dilatation of the LEFT renal collecting system though no obstructing calculus is identified, question from UPJ obstruction. Cholelithiasis. 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 Aortic Atherosclerosis (ICD10-I70.0). Aortic aneurysm NOS (ICD10-I71.9). Electronically Signed   By: Lavonia Dana M.D.   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 Continue to wean CBI. We will reassess 1/23 in AM. If hematuria worsens patient will need cystoscopy with fulgeration   LOS: 1 day   Nicolette Bang 08/17/2019, 6:56 AM

## 2019-08-18 ENCOUNTER — Inpatient Hospital Stay (HOSPITAL_COMMUNITY): Payer: Medicare Other | Admitting: Certified Registered Nurse Anesthetist

## 2019-08-18 ENCOUNTER — Encounter (HOSPITAL_COMMUNITY)
Admission: EM | Disposition: A | Discharge status: Home Health | Payer: Self-pay | Source: Skilled Nursing Facility | Attending: Internal Medicine

## 2019-08-18 ENCOUNTER — Encounter (HOSPITAL_COMMUNITY): Payer: Self-pay | Admitting: Family Medicine

## 2019-08-18 HISTORY — PX: TRANSURETHRAL RESECTION OF BLADDER TUMOR: SHX2575

## 2019-08-18 LAB — RESPIRATORY PANEL BY RT PCR (FLU A&B, COVID)
Influenza A by PCR: NEGATIVE
Influenza B by PCR: NEGATIVE
SARS Coronavirus 2 by RT PCR: NEGATIVE

## 2019-08-18 SURGERY — TURBT (TRANSURETHRAL RESECTION OF BLADDER TUMOR)
Anesthesia: General | Site: Bladder

## 2019-08-18 MED ORDER — ONDANSETRON HCL 4 MG/2ML IJ SOLN: 4.0000 mg | Freq: Once | INTRAMUSCULAR | Status: DC | PRN

## 2019-08-18 MED ORDER — LIDOCAINE HCL (CARDIAC) PF 100 MG/5ML IV SOSY
PREFILLED_SYRINGE | INTRAVENOUS | Status: DC | PRN
  Administered 2019-08-18: 100 mg via INTRAVENOUS

## 2019-08-18 MED ORDER — EPHEDRINE SULFATE 50 MG/ML IJ SOLN
INTRAMUSCULAR | Status: DC | PRN
  Administered 2019-08-18 (×4): 7.5 mg via INTRAVENOUS

## 2019-08-18 MED ORDER — SODIUM CHLORIDE 0.9 % IR SOLN
Status: DC | PRN
  Administered 2019-08-18: 6000 mL

## 2019-08-18 MED ORDER — CEFAZOLIN SODIUM-DEXTROSE 2-4 GM/100ML-% IV SOLN
INTRAVENOUS | Status: AC
  Filled 2019-08-18: qty 100

## 2019-08-18 MED ORDER — CEFAZOLIN SODIUM-DEXTROSE 2-3 GM-%(50ML) IV SOLR
INTRAVENOUS | Status: DC | PRN
  Administered 2019-08-18: 2 g via INTRAVENOUS

## 2019-08-18 MED ORDER — BELLADONNA ALKALOIDS-OPIUM 16.2-60 MG RE SUPP
RECTAL | Status: DC | PRN
  Administered 2019-08-18: 1 via RECTAL

## 2019-08-18 MED ORDER — PROPOFOL 10 MG/ML IV BOLUS
INTRAVENOUS | Status: AC
  Filled 2019-08-18: qty 20

## 2019-08-18 MED ORDER — FENTANYL CITRATE (PF) 100 MCG/2ML IJ SOLN
INTRAMUSCULAR | Status: AC
  Filled 2019-08-18: qty 2

## 2019-08-18 MED ORDER — PROPOFOL 500 MG/50ML IV EMUL
INTRAVENOUS | Status: AC
  Filled 2019-08-18: qty 100

## 2019-08-18 MED ORDER — BELLADONNA ALKALOIDS-OPIUM 16.2-30 MG RE SUPP
RECTAL | Status: AC
  Filled 2019-08-18: qty 1

## 2019-08-18 MED ORDER — ONDANSETRON HCL 4 MG/2ML IJ SOLN
INTRAMUSCULAR | Status: DC | PRN
  Administered 2019-08-18: 4 mg via INTRAVENOUS

## 2019-08-18 MED ORDER — PROPOFOL 10 MG/ML IV BOLUS
INTRAVENOUS | Status: DC | PRN
  Administered 2019-08-18: 20 mg via INTRAVENOUS
  Administered 2019-08-18: 70 mg via INTRAVENOUS

## 2019-08-18 MED ORDER — ONDANSETRON HCL 4 MG/2ML IJ SOLN
INTRAMUSCULAR | Status: AC
  Filled 2019-08-18: qty 2

## 2019-08-18 MED ORDER — LACTATED RINGERS IV SOLN: INTRAVENOUS | Status: DC | PRN

## 2019-08-18 MED ORDER — FENTANYL CITRATE (PF) 100 MCG/2ML IJ SOLN
INTRAMUSCULAR | Status: DC | PRN
  Administered 2019-08-18: 25 ug via INTRAVENOUS
  Administered 2019-08-18: 50 ug via INTRAVENOUS
  Administered 2019-08-18: 25 ug via INTRAVENOUS

## 2019-08-18 MED ORDER — SODIUM CHLORIDE 0.9 % IR SOLN
Status: DC | PRN
  Administered 2019-08-18: 1000 mL

## 2019-08-18 MED ORDER — STERILE WATER FOR IRRIGATION IR SOLN
Status: DC | PRN
  Administered 2019-08-18: 20 mL

## 2019-08-18 MED ORDER — FENTANYL CITRATE (PF) 100 MCG/2ML IJ SOLN
25.0000 ug | INTRAMUSCULAR | Status: DC | PRN
  Administered 2019-08-18: 13:00:00 25 ug via INTRAVENOUS
  Administered 2019-08-18: 50 ug via INTRAVENOUS
  Administered 2019-08-18: 25 ug via INTRAVENOUS

## 2019-08-18 MED ORDER — EPHEDRINE 5 MG/ML INJ
INTRAVENOUS | Status: AC
  Filled 2019-08-18: qty 10

## 2019-08-18 SURGICAL SUPPLY — 22 items
BAG URINE DRAIN 2000ML AR STRL (UROLOGICAL SUPPLIES) ×3 IMPLANT
BAG URO CATCHER STRL LF (MISCELLANEOUS) ×3 IMPLANT
CATH SILICONE 22FR 30CC 3WAY (CATHETERS) ×3 IMPLANT
ELECT BIVAP BIPO 22/24 DONUT (ELECTROSURGICAL) ×3
ELECT REM PT RETURN 15FT ADLT (MISCELLANEOUS) ×3 IMPLANT
ELECTRD BIVAP BIPO 22/24 DONUT (ELECTROSURGICAL) ×1 IMPLANT
GLOVE BIOGEL M 8.0 STRL (GLOVE) ×3 IMPLANT
GLOVE BIOGEL PI IND STRL 7.5 (GLOVE) ×2 IMPLANT
GLOVE BIOGEL PI IND STRL 8 (GLOVE) ×1 IMPLANT
GLOVE BIOGEL PI INDICATOR 7.5 (GLOVE) ×4
GLOVE BIOGEL PI INDICATOR 8 (GLOVE) ×2
GOWN STRL REUS W/TWL LRG LVL3 (GOWN DISPOSABLE) ×3 IMPLANT
GOWN STRL REUS W/TWL XL LVL3 (GOWN DISPOSABLE) ×3 IMPLANT
KIT TURNOVER KIT A (KITS) ×3 IMPLANT
LOOP CUT BIPOLAR 24F LRG (ELECTROSURGICAL) ×3 IMPLANT
LOOP MONOPOLAR YLW (ELECTROSURGICAL) ×3 IMPLANT
MANIFOLD NEPTUNE II (INSTRUMENTS) ×3 IMPLANT
PACK CYSTO (CUSTOM PROCEDURE TRAY) ×3 IMPLANT
SYRINGE IRR TOOMEY STRL 70CC (SYRINGE) ×3 IMPLANT
TUBING CONNECTING 10 (TUBING) ×2 IMPLANT
TUBING CONNECTING 10' (TUBING) ×1
TUBING UROLOGY SET (TUBING) ×3 IMPLANT

## 2019-08-18 NOTE — Progress Notes (Signed)
PROGRESS NOTE  Jesse [name] [id number] DOB: 02/04/1928 DOA: 08/15/2019 PCP: [name], MD  HPI/Recap of past 24 hours: HPI:Jesse W Sharpeis a 86 y.o.malewith medical history significant ofCAD; nephrolithiasis; HTN; HLD; CAD s/p CABG;mild dementia;AAA; COVID-19 infection in 05/2019; and remote prostate cancertreated with XRTpresenting with gross hematuria. He reports that last night he noticed acute onset of gross hematuria with associated pelvic pain and cramping. He has continued to have hematuria through the day today and so his ALF decided to have him sent in to the ER. No symptoms prior to onset yesterday.  He had a left hip fracture with repair in September and a right hip fracture with repair in November. At that time, he was found to be COVID-19 positive and was treated with remdesivir at [facility].  August 18, 2019 Subjective: Patient seen and examined at bedside he is getting ready to go to the OR for fulguration of the prostatic urethra.  He denies any complaints today  Assessment/Plan: Principal Problem:   Gross hematuria Active Problems:   HLD (hyperlipidemia)   Essential hypertension   PERIPHERAL VASCULAR DISEASE   Chronic systolic heart failure (HCC)   COVID-19 virus infection   History of prostate cancer  Gross hematuria/acute blood loss anemia: Status post placement of Foley in ED  Status post Foley placement in the emergency department.  Urology is managing.  He has had multiple irrigation  Continue to hold aspirin.    Patient is for fulguration  UTI:  We will continue Rocephin and tailor antibiotics according to culture    PVD-patient has multiple known aneurysm including AAA SP repair CT scan shows slight increase in the size of be internal iliac artery aneurysm.    H/o prostate CA Urology is following-   Essential hypertension:  Fairly controlled we will continue carvedilol and to monitor.   HLD -His fenofibrate was held due to  limited inpatient utility.  We will continue Zocor   Chronic combined CHF -Patient has grade 1 diastolic dysfunction according to an echocardiogram done January 2019 which shows ejection fraction of 40 to 55% We will continue to monitor for evidence of overload volume overload   H/o COVID-19 infection -Patient had recent infection on November 2020.  He received treatments his repeat test on this admission negative for both PCR and rapid test for Covid    Code Status: DNR  Severity of Illness: The appropriate patient status for this patient is INPATIENT. Inpatient status is judged to be reasonable and necessary in order to provide the required intensity of service to ensure the patient's safety. The patient's presenting symptoms, physical exam findings, and initial radiographic and laboratory data in the context of their chronic comorbidities is felt to place them at high risk for further clinical deterioration. Furthermore, it is not anticipated that the patient will be medically stable for discharge from the hospital within 2 midnights of admission. The following factors support the patient status of inpatient.   " Patient was admitted with gross hematuria currently has a Foley catheter needing irrigation.  Urology was consulted he is scheduled to do bladder fulguration, tomorrow* I certify that at the point of admission it is my clinical judgment that the patient will require inpatient hospital care spanning beyond 2 midnights from the point of admission due to high intensity of service, high risk for further deterioration and high frequency of surveillance required.*    Family Communication: None at bedside  Disposition Plan: Home when stable   Consultants:  Urology  Procedures:  Bladder irrigation  Antimicrobials:  Rocephin  DVT prophylaxis: SCD   Objective: Vitals:   08/18/19 1335 08/18/19 1345 08/18/19 1421 08/18/19 1605  BP:  133/64 (!) 143/71 (!) 102/54  Pulse:  67 63 64 72  Resp: 17 15 16 18   Temp:  97.8 F (36.6 C) 97.6 F (36.4 C) 98 F (36.7 C)  TempSrc:   Oral Oral  SpO2: 100% 99% 99% 98%  Weight:      Height:        Intake/Output Summary (Last 24 hours) at 08/18/2019 1616 Last data filed at 08/18/2019 1345 Gross per 24 hour  Intake 1840 ml  Output 2950 ml  Net -1110 ml   Filed Weights   08/15/19 0959 08/17/19 0606  Weight: 79.4 kg 78.1 kg   Body mass index is 23.35 kg/m.  Exam:  . General: 86 y.o. year-old male well developed well nourished in no acute distress.  Alert and oriented x3. . Cardiovascular: Regular rate and rhythm with no rubs or gallops.  No thyromegaly or JVD noted.   Marland Kitchen Respiratory: Clear to auscultation with no wheezes or rales. Good inspiratory effort. . Abdomen: Soft nontender nondistended with normal bowel sounds x4 quadrants. . Musculoskeletal: No lower extremity edema. 2/4 pulses in all 4 extremities. . Skin: No ulcerative lesions noted or rashes, . Psychiatry: Mood is appropriate for condition and setting . GU indwelling Foley catheter with bag slightly dark urine noted no frank blood    Data Reviewed: CBC: Recent Labs  Lab 08/15/19 1010 08/16/19 0444  WBC 11.8* 14.2*  NEUTROABS 10.1*  --   HGB 12.3* 11.1*  HCT 39.4 34.5*  MCV 93.8 93.5  PLT 312 [id number]   Basic Metabolic Panel: Recent Labs  Lab 08/15/19 1010 08/16/19 0444  NA 136 137  K 4.4 3.9  CL 104 103  CO2 20* 21*  GLUCOSE 125* 147*  BUN 25* 28*  CREATININE 1.10 1.05  CALCIUM 10.4* 9.5   GFR: Estimated Creatinine Clearance: 52.3 mL/min (by C-G formula based on SCr of 1.05 mg/dL). Liver Function Tests: No results for input(s): AST, ALT, ALKPHOS, BILITOT, PROT, ALBUMIN in the last 168 hours. No results for input(s): LIPASE, AMYLASE in the last 168 hours. No results for input(s): AMMONIA in the last 168 hours. Coagulation Profile: No results for input(s): INR, PROTIME in the last 168 hours. Cardiac Enzymes: No results for  input(s): CKTOTAL, CKMB, CKMBINDEX, TROPONINI in the last 168 hours. BNP (last 3 results) No results for input(s): PROBNP in the last 8760 hours. HbA1C: No results for input(s): HGBA1C in the last 72 hours. CBG: No results for input(s): GLUCAP in the last 168 hours. Lipid Profile: No results for input(s): CHOL, HDL, LDLCALC, TRIG, CHOLHDL, LDLDIRECT in the last 72 hours. Thyroid Function Tests: No results for input(s): TSH, T4TOTAL, FREET4, T3FREE, THYROIDAB in the last 72 hours. Anemia Panel: No results for input(s): VITAMINB12, FOLATE, FERRITIN, TIBC, IRON, RETICCTPCT in the last 72 hours. Urine analysis:    Component Value Date/Time   COLORURINE RED (A) 08/15/2019 1052   APPEARANCEUR TURBID (A) 08/15/2019 1052   LABSPEC  08/15/2019 1052    TEST NOT REPORTED DUE TO COLOR INTERFERENCE OF URINE PIGMENT   PHURINE  08/15/2019 1052    TEST NOT REPORTED DUE TO COLOR INTERFERENCE OF URINE PIGMENT   GLUCOSEU (A) 08/15/2019 1052    TEST NOT REPORTED DUE TO COLOR INTERFERENCE OF URINE PIGMENT   HGBUR (A) 08/15/2019 1052    TEST NOT REPORTED DUE  TO COLOR INTERFERENCE OF URINE PIGMENT   BILIRUBINUR (A) 08/15/2019 1052    TEST NOT REPORTED DUE TO COLOR INTERFERENCE OF URINE PIGMENT   KETONESUR (A) 08/15/2019 1052    TEST NOT REPORTED DUE TO COLOR INTERFERENCE OF URINE PIGMENT   PROTEINUR (A) 08/15/2019 1052    TEST NOT REPORTED DUE TO COLOR INTERFERENCE OF URINE PIGMENT   UROBILINOGEN 2.0 (H) 05/28/2015 0029   NITRITE (A) 08/15/2019 1052    TEST NOT REPORTED DUE TO COLOR INTERFERENCE OF URINE PIGMENT   LEUKOCYTESUR (A) 08/15/2019 1052    TEST NOT REPORTED DUE TO COLOR INTERFERENCE OF URINE PIGMENT   Sepsis Labs: @LABRCNTIP (procalcitonin:4,lacticidven:4)  ) Recent Results (from the past 240 hour(s))  Urine culture     Status: Abnormal   Collection Time: 08/15/19 10:52 AM   Specimen: Urine, Random  Result Value Ref Range Status   Specimen Description URINE, RANDOM  Final   Special  Requests   Final    NONE Performed at [facility], [address], [place], [place] [address]    Culture >=100,000 COLONIES/mL ENTEROCOCCUS FAECALIS (A)  Final   Report Status 08/17/2019 FINAL  Final   Organism ID, Bacteria ENTEROCOCCUS FAECALIS (A)  Final      Susceptibility   Enterococcus faecalis - MIC*    AMPICILLIN <=2 SENSITIVE Sensitive     NITROFURANTOIN <=16 SENSITIVE Sensitive     VANCOMYCIN 1 SENSITIVE Sensitive     * >=100,000 COLONIES/mL ENTEROCOCCUS FAECALIS  Surgical pcr screen     Status: None   Collection Time: 08/16/19  2:42 AM   Specimen: Nasal Mucosa; Nasal Swab  Result Value Ref Range Status   MRSA, PCR NEGATIVE NEGATIVE Final   Staphylococcus aureus NEGATIVE NEGATIVE Final    Comment: (NOTE) The Xpert SA Assay (FDA approved for NASAL specimens in patients 44 years of age and older), is one component of a comprehensive surveillance program. It is not intended to diagnose infection nor to guide or monitor treatment. Performed at [facility], [place] [address] [place], [place] [address]   Respiratory Panel by RT PCR (Flu A&B, Covid) - Nasopharyngeal Swab     Status: None   Collection Time: 08/18/19  8:42 AM   Specimen: Nasopharyngeal Swab  Result Value Ref Range Status   SARS Coronavirus 2 by RT PCR NEGATIVE NEGATIVE Final    Comment: (NOTE) SARS-CoV-2 target nucleic acids are NOT DETECTED. The SARS-CoV-2 RNA is generally detectable in upper respiratoy specimens during the acute phase of infection. The lowest concentration of SARS-CoV-2 viral copies this assay can detect is 131 copies/mL. A negative result does not preclude SARS-Cov-2 infection and should not be used as the sole basis for treatment or other patient management decisions. A negative result may occur with  improper specimen collection/handling, submission of specimen other than nasopharyngeal swab, presence of viral mutation(s) within the areas targeted by this assay, and  inadequate number of viral copies (<131 copies/mL). A negative result must be combined with clinical observations, patient history, and epidemiological information. The expected result is Negative. Fact Sheet for Patients:  PinkCheek.be Fact Sheet for Healthcare Providers:  GravelBags.it This test is not yet ap proved or cleared by the [place] FDA and  has been authorized for detection and/or diagnosis of SARS-CoV-2 by FDA under an Emergency Use Authorization (EUA). This EUA will remain  in effect (meaning this test can be used) for the duration of the COVID-19 declaration under Section 564(b)(1) of the Act, 21  U.S.C. section 360bbb-3(b)(1), unless the authorization is terminated or revoked sooner.    Influenza A by PCR NEGATIVE NEGATIVE Final   Influenza B by PCR NEGATIVE NEGATIVE Final    Comment: (NOTE) The Xpert Xpress SARS-CoV-2/FLU/RSV assay is intended as an aid in  the diagnosis of influenza from Nasopharyngeal swab specimens and  should not be used as a sole basis for treatment. Nasal washings and  aspirates are unacceptable for Xpert Xpress SARS-CoV-2/FLU/RSV  testing. Fact Sheet for Patients: PinkCheek.be Fact Sheet for Healthcare Providers: GravelBags.it This test is not yet approved or cleared by the [place] FDA and  has been authorized for detection and/or diagnosis of SARS-CoV-2 by  FDA under an Emergency Use Authorization (EUA). This EUA will remain  in effect (meaning this test can be used) for the duration of the  Covid-19 declaration under Section 564(b)(1) of the Act, 21  U.S.C. section 360bbb-3(b)(1), unless the authorization is  terminated or revoked. Performed at [facility], [place] [address], [place], [place] [address]       Studies: No results found.  Scheduled Meds: . carvedilol  12.5 mg Oral BID WC  .  Chlorhexidine Gluconate Cloth  6 each Topical Daily  . docusate sodium  100 mg Oral BID  . fentaNYL      . loratadine  10 mg Oral Daily  . pantoprazole  40 mg Oral Daily  . polyvinyl alcohol  2 drop Both Eyes QHS  . simvastatin  40 mg Oral q1800  . sodium chloride  2 spray Each Nare BID    Continuous Infusions: . ampicillin (OMNIPEN) IV 2 g (08/18/19 1600)  . sodium chloride irrigation    . sodium chloride irrigation       LOS: 2 days     Cristal Deer, MD [facility]  To reach me or the doctor on call, go to: [email or web address] Password [facility]  08/18/2019, 4:16 PM

## 2019-08-18 NOTE — Op Note (Signed)
.  Preoperative diagnosis: gross hematuria  Postoperative diagnosis: Same  Procedure: 1 cystoscopy 2.   Clot evacuation 3. Fulgeration of prostatic urethra  Attending: Rosie Fate, MD  Resdient: Ysidro Evert, MD  Anesthesia: General  Estimated blood loss: Minimal  Drains: 22 French foley  Specimens: none  Antibiotics: ancef  Findings: multiple areas of irritation from foley catheter. 40cc clot in the bladder. diffue bleeding from prostatic urethra  Indications: Patient is a 86 year old male with a history of gross hematuria that was not improving on continuous bladder irrigation.  After discussing treatment options, they decided proceed with clot evacuation and fulgeration.  Procedure her in detail: The patient was brought to the operating room and a brief timeout was done to ensure correct patient, correct procedure, correct site.  General anesthesia was administered patient was placed in dorsal lithotomy position.  Their genitalia was then prepped and draped in usual sterile fashion.  A rigid 91 French cystoscope was passed in the urethra and the bladder.  Bladder was inspected and we noted a clot and diffuse bleeding from prostatic urethra.  the ureteral orifices were in the normal orthotopic locations.  We then removed the cystoscope and placed a resectoscope into the bladder. We proceeded to remove the clot burden from the bladder. Once this was complete we turned our attention to the prostatic urethra. Using the bipolar resectoscope we fulgerated the prostatic urethra from the bladder neck to the verumontanum. We then re-inspected the bladder and found no residual bleeding.  the bladder was then drained, a 22 French foley was placed and this concluded the procedure which was well tolerated by patient.  Complications: None  Condition: Stable, extubated, transferred to PACU  Plan: Patient is admitted overnight with continuous bladder irrigation. If their urine is clear  tomorrow they will be discharged home.

## 2019-08-18 NOTE — Anesthesia Postprocedure Evaluation (Signed)
Anesthesia Post Note  Patient: Jesse [name]  Procedure(s) Performed: cysto fulgeration clot evacuation (N/A Bladder)     Patient location during evaluation: PACU Anesthesia Type: General Level of consciousness: awake and alert Pain management: pain level controlled Vital Signs Assessment: post-procedure vital signs reviewed and stable Respiratory status: spontaneous breathing, nonlabored ventilation, respiratory function stable and patient connected to nasal cannula oxygen Cardiovascular status: blood pressure returned to baseline and stable Postop Assessment: no apparent nausea or vomiting Anesthetic complications: no    Last Vitals:  Vitals:   08/18/19 1421 08/18/19 1605  BP: (!) 143/71 (!) 102/54  Pulse: 64 72  Resp: 16 18  Temp: 36.4 C 36.7 C  SpO2: 99% 98%    Last Pain:  Vitals:   08/18/19 1605  TempSrc: Oral  PainSc:                  Catalina Gravel

## 2019-08-18 NOTE — Transfer of Care (Signed)
Immediate Anesthesia Transfer of Care Note  Patient: Jesse [name]  Procedure(s) Performed: Procedure(s): cysto fulgeration clot evacuation (N/A)  Patient Location: PACU  Anesthesia Type:General  Level of Consciousness:  sedated, patient cooperative and responds to stimulation  Airway & Oxygen Therapy:Patient Spontanous Breathing and Patient connected to face mask oxgen  Post-op Assessment:  Report given to PACU RN and Post -op Vital signs reviewed and stable  Post vital signs:  Reviewed and stable  Last Vitals:  Vitals:   08/17/19 2027 08/18/19 0552  BP: 103/66 115/77  Pulse: 72 63  Resp: 17 17  Temp: 36.9 C 36.8 C  SpO2: [id number]    Complications: No apparent anesthesia complications

## 2019-08-18 NOTE — Progress Notes (Addendum)
  Subjective: Slow drip CBI yesterday, urine still red with even slight cath manipulation. Given it has been several days discussed cysto  Fulguration. Patient agreeable. COVID test not resulted.   Objective: Vital signs in last 24 hours: Temp:  [98.3 F (36.8 C)-98.5 F (36.9 C)] 98.3 F (36.8 C) (01/24 0552) Pulse Rate:  [63-72] 63 (01/24 0552) Resp:  [16-17] 17 (01/24 0552) BP: (81-115)/(54-77) 115/77 (01/24 0552) SpO2:  [95 %] 95 % (01/24 0552)  Intake/Output from previous day: 01/23 0701 - 01/24 0700 In: 1420 [P.O.:720; IV Piggyback:200] Out: 3800 [Urine:3800] Intake/Output this shift: Total I/O In: -  Out: 500 [Urine:500]  Physical Exam:  General:alert, cooperative and appears stated age GU: [place] urine in tubing  Lab Results: Recent Labs    08/15/19 1010 08/16/19 0444  HGB 12.3* 11.1*  HCT 39.4 34.5*   BMET Recent Labs    08/15/19 1010 08/16/19 0444  NA 136 137  K 4.4 3.9  CL 104 103  CO2 20* 21*  GLUCOSE 125* 147*  BUN 25* 28*  CREATININE 1.10 1.05  CALCIUM 10.4* 9.5   No results for input(s): LABPT, INR in the last 72 hours. No results for input(s): LABURIN in the last 72 hours.   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  Still with hematuria despite several days of CBI. Plan for fulgaration today 2.  Continue Ampicillin for eneterococcus UTI 3.  NPO until procedure today, then ok for regular diet 4. Hopefully can get him clear today wuth discharge tomorrow, +/- foley pending what urine looks like     LOS: 2 days   Tharon Aquas 08/18/2019, 8:34 AM

## 2019-08-18 NOTE — Anesthesia Procedure Notes (Signed)
Procedure Name: LMA Insertion Date/Time: 08/18/2019 11:50 AM Performed by: Lavina Hamman, CRNA Pre-anesthesia Checklist: Patient identified, Emergency Drugs available, Suction available and Patient being monitored Patient Re-evaluated:Patient Re-evaluated prior to induction Oxygen Delivery Method: Circle System Utilized Preoxygenation: Pre-oxygenation with 100% oxygen Induction Type: IV induction Ventilation: Mask ventilation without difficulty LMA: LMA inserted LMA Size: 4.0 Number of attempts: 1 Airway Equipment and Method: Bite block Placement Confirmation: positive ETCO2 Tube secured with: Tape Dental Injury: Teeth and Oropharynx as per pre-operative assessment

## 2019-08-18 NOTE — Anesthesia Preprocedure Evaluation (Addendum)
Anesthesia Evaluation  Patient identified by MRN, date of birth, ID band Patient awake    Reviewed: Allergy & Precautions, NPO status , Patient's Chart, lab work & pertinent test results, reviewed documented beta blocker date and time   Airway Mallampati: II  TM Distance: >3 FB Neck ROM: Full    Dental  (+) Teeth Intact, Dental Advisory Given, Implants   Pulmonary former smoker,    Pulmonary exam normal breath sounds clear to auscultation       Cardiovascular hypertension, Pt. on medications and Pt. on home beta blockers + CAD, + Past MI, + CABG and + Peripheral Vascular Disease  Normal cardiovascular exam+ dysrhythmias  Rhythm:Regular Rate:Normal     Neuro/Psych PSYCHIATRIC DISORDERS Anxiety negative neurological ROS     GI/Hepatic Neg liver ROS, PUD, GERD  Medicated and Controlled,  Endo/Other  negative endocrine ROS  Renal/GU negative Renal ROS     Musculoskeletal  (+) Arthritis ,   Abdominal   Peds  Hematology  (+) Blood dyscrasia, anemia ,   Anesthesia Other Findings Day of surgery medications reviewed with the patient.  Reproductive/Obstetrics                             Anesthesia Physical Anesthesia Plan  ASA: III  Anesthesia Plan: General   Post-op Pain Management:    Induction: Intravenous  PONV Risk Score and Plan: 3 and Dexamethasone and Ondansetron  Airway Management Planned: LMA  Additional Equipment:   Intra-op Plan:   Post-operative Plan: Extubation in OR  Informed Consent: I have reviewed the patients History and Physical, chart, labs and discussed the procedure including the risks, benefits and alternatives for the proposed anesthesia with the patient or authorized representative who has indicated his/her understanding and acceptance.   Patient has DNR.  Discussed DNR with patient and Suspend DNR.   Dental advisory given  Plan Discussed with:  CRNA  Anesthesia Plan Comments:        Anesthesia Quick Evaluation

## 2019-08-19 LAB — SARS CORONAVIRUS 2 (TAT 6-24 HRS): SARS Coronavirus 2: POSITIVE — AB

## 2019-08-19 MED ORDER — AMOXICILLIN-POT CLAVULANATE 500-125 MG PO TABS: 1.0000 | ORAL_TABLET | Freq: Two times a day (BID) | ORAL | 0 refills | Status: AC

## 2019-08-19 MED ORDER — SODIUM CHLORIDE 0.9 % IV SOLN: INTRAVENOUS | Status: DC

## 2019-08-19 MED ORDER — SACCHAROMYCES BOULARDII 250 MG PO CAPS: 250.0000 mg | ORAL_CAPSULE | Freq: Two times a day (BID) | ORAL | 0 refills | Status: AC

## 2019-08-19 MED ORDER — ASPIRIN 81 MG PO TABS: 81.0000 mg | ORAL_TABLET | Freq: Every day | ORAL | Status: DC

## 2019-08-19 NOTE — Progress Notes (Signed)
Pt due to void by 1450.

## 2019-08-19 NOTE — Care Management Important Message (Signed)
Important Message  Patient Details IM Letter given to Dessa Phi RN Case Manager to present to the Patient Name: Jesse [name] MRN: [id number] Date of Birth: 24-Jan-1928   Medicare Important Message Given:  Yes     Kerin Salen 08/19/2019, 11:54 AM

## 2019-08-19 NOTE — Progress Notes (Addendum)
1 Day Post-Op   Subjective: Did well since procedure and urine remained clear off CBI overnight.  Normally voids without issue at baseline.  No obvious signs of cancer recurrence during cystoscopy.  Objective: Vital signs in last 24 hours: Temp:  [97.5 F (36.4 C)-98.1 F (36.7 C)] 98.1 F (36.7 C) (01/25 0509) Pulse Rate:  [62-72] 62 (01/25 0509) Resp:  [9-19] 17 (01/25 0509) BP: (102-143)/(54-81) 130/72 (01/25 0509) SpO2:  [95 %-100 %] 96 % (01/25 0509)  Intake/Output from previous day: 01/24 0701 - 01/25 0700 In: 1500 [I.V.:800; IV Piggyback:500] Out: 1300 [Urine:1300] Intake/Output this shift: No intake/output data recorded.  Physical Exam:  General:alert, cooperative and appears stated age GU: [place]berry urine in tubing  Lab Results: No results for input(s): HGB, HCT in the last 72 hours. BMET No results for input(s): NA, K, CL, CO2, GLUCOSE, BUN, CREATININE, CALCIUM in the last 72 hours. No results for input(s): LABPT, INR in the last 72 hours. No results for input(s): LABURIN in the last 72 hours.   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  Urine clear after fulguration  -Discontinue Foley, trial of void with postvoid residual bladder scan  -If urine remains clear patient passed a trial of void he is okay to discharge from urology perspective.  He does not need urology follow-up and can follow-up as needed for recurrence of hematuria.  He does not have any cancer on cystoscopy.   LOS: 3 days   Tharon Aquas 08/19/2019, 7:32 AM

## 2019-08-19 NOTE — Discharge Summary (Addendum)
Physician Discharge Summary  Jesse [name] [id number] DOB: Mar 06, 1928 DOA: 08/15/2019  PCP: [name], MD  Admit date: 08/15/2019 Discharge date: 08/21/2019  Admitted From: ALF Discharge disposition: ALF   Code Status: DNR  Diet Recommendation: Cardiac diet  Recommendations for Outpatient Follow-Up:   1. Follow-up with urology as an outpatient as needed 2. Follow-up with PCP.  Discharge Diagnosis:   Principal Problem:   Gross hematuria Active Problems:   HLD (hyperlipidemia)   Essential hypertension   PERIPHERAL VASCULAR DISEASE   Chronic systolic heart failure ([place])   COVID-19 virus infection   History of prostate cancer  History of Present Illness / Brief narrative:  Jesse [name] a 86 y.o.malewith medical history significant ofCAD; nephrolithiasis; HTN; HLD; CAD s/p CABG;mild dementia;AAA; COVID-19 infection in 05/2019; and remote prostate cancertreated with XRT. Patient presented to the ED from ALF on 1/21 with acute onset gross hematuria associated with pelvic pain and cramping.   Hospital Course:  Gross hematuria H/o prostate CA -Foley catheter was placed in the ED and was started on CBI. Urology consulted.  -1/24, patient underwent cystoscopy. He was noted to havediffue bleeding from prostatic urethralikely d/t radiation cystitis. He underwentclot evacuation and fulguration of prostatic urethra. -Hematuria has eventually stopped. Foley catheter removed on 1/25. Voiding trial successful.  -Aspirin remains on hold.  Can resume in a week after procedure from February 1.  Enterococcus faecalis UTI -Urine culture obtained on admission grew Enterococcus faecalis. -Patient is currently on IV ampicillin. Plan to discharge   him on 5 more days of oral Augmentin 500 mg twice daily with probiotics.   Orthostatic hypotension History of hypertension -Patient has history of hypertension and is on Coreg at home. -1/25, he was noted to have an  orthostatic drop in blood pressure today from 116/86 to 87/51.  Likely because of excessive bleeding in the preceding 48 hours.    Adequately hydrated with IV fluid.  Chronic combined CHF -Continue Coreg.  Not on diuretics at home.   -Patient has grade 1 diastolic dysfunction according to an echocardiogram done January 2019 which shows ejection fraction of 40 to 55% -Compensated CHF.  Hyperlipidemia -Fenofibrate, Zocor.  PVD -patient has multiple known aneurysm including AAAs/prepair.CT scan shows slight increase in the size of be internal iliac artery aneurysm.  Chronic normocytic anemia -Baseline hemoglobin between 9-10.  No active bleeding.  H/o COVID-19 infection -Patient had recent infection on November 2020. He received treatments and his repeat test on this admission negative for both PCR and rapid test for Covid  Recent history of hip fractures -He had a left hip fracture with repair in September and a right hip fracture with repair in November.  -PT eval obtained.  Patient feels really weak today.  Home health PT recommended by therapy.  Stable to discharge back to ALF with home health PT.   Subjective:  Seen and examined this morning.  Pleasant elderly Caucasian male.  Propped up in bed.  Not in distress.  No new symptoms.  No loose bowel movement today.  Discharge Exam:   Vitals:   08/20/19 0905 08/20/19 1223 08/20/19 2041 08/21/19 0510  BP: 121/89 128/68 122/64 123/61  Pulse: 69 68 75 64  Resp:  16 18 18   Temp:  98 F (36.7 C) 98.7 F (37.1 C) 98.1 F (36.7 C)  TempSrc:  Oral Oral Oral  SpO2:  98% 98% 97%  Weight:      Height:        Body mass  index is 23.35 kg/m.  General exam: Appears calm and comfortable.  Skin: No rashes, lesions or ulcers. HEENT: Atraumatic, normocephalic, supple neck, no obvious bleeding Lungs: Clear to auscultation bilaterally CVS: Regular rate and rhythm, no murmur GI/Abd soft, nontender, nondistended, bowel sound  present CNS: Alert, awake, oriented x3 Psychiatry: Mood appropriate Extremities: No pedal edema, no calf tenderness  Discharge Instructions:  Wound care: None Aspirin is on hold for next 7 days. Discharge Instructions    Diet - low sodium heart healthy   Complete by: As directed    Increase activity slowly   Complete by: As directed      Follow-up Information    [name], MD Follow up.   Specialty: Urology Why: Please contact the office to be seen in the next 1-2 weeks.  You will need a cystoscopy to evaluate the bladder lining when you come in.   Contact information: [place] [address] [phone]        [name], MD Follow up.   Specialty: Family Medicine Contact information: [place] [address] [phone]        [name], MD .   Specialty: Cardiology Contact information: [phone] [place] [address] [phone]          Allergies as of 08/21/2019      Reactions   Quinolones Other (See Comments)   unknown   Adhesive [tape] Itching, Rash      Medication List    STOP taking these medications   enoxaparin 40 MG/0.4ML injection Commonly known as: LOVENOX     TAKE these medications   acetaminophen 500 MG tablet Commonly known as: TYLENOL Take 500 mg by mouth every 6 (six) hours as needed for mild pain or headache.   alendronate 70 MG tablet Commonly known as: FOSAMAX Take 70 mg by mouth once a week. Saturday   amoxicillin-clavulanate 500-125 MG tablet Commonly known as: Augmentin Take 1 tablet (500 mg total) by mouth 2 (two) times daily for 5 days.   aspirin 81 MG tablet Take 1 tablet (81 mg total) by mouth daily. Start taking on: August 26, 2019 What changed: These instructions start on August 26, 2019. If you are unsure what to do until then, ask your doctor or other care provider.   B-12 PO Take 1,000 mcg by mouth daily.   carboxymethylcellulose 0.5 %  Soln Commonly known as: REFRESH PLUS Apply 2 drops to eye at bedtime.   carvedilol 12.5 MG tablet Commonly known as: COREG TAKE 1 TABLET BY MOUTH 2 TIMES DAILY WITH A MEAL What changed:   how much to take  how to take this  when to take this  additional instructions   docusate sodium 100 MG capsule Commonly known as: COLACE Take 2 capsules (200 mg total) by mouth 2 (two) times daily.   fenofibrate 160 MG tablet Take 1 tablet (160 mg total) by mouth daily.   ferrous sulfate 325 (65 FE) MG tablet Take 325 mg by mouth daily with breakfast.   loratadine 10 MG tablet Commonly known as: CLARITIN Take 10 mg by mouth daily.   Melatonin 3 MG Tabs Take 3 mg by mouth at bedtime as needed (sleep).   pantoprazole 40 MG tablet Commonly known as: PROTONIX Take 40 mg by mouth daily.   saccharomyces boulardii 250 MG capsule Commonly known as: FLORASTOR Take 1 capsule (250 mg total) by mouth 2 (two) times daily for 5 days.  simvastatin 40 MG tablet Commonly known as: ZOCOR Take 1 tablet (40 mg total) by mouth daily at 6 PM.   sodium chloride 0.65 % Soln nasal spray Commonly known as: OCEAN Place 2 sprays into both nostrils 2 (two) times daily.       Time coordinating discharge: 35 minutes  The results of significant diagnostics from this hospitalization (including imaging, microbiology, ancillary and laboratory) are listed below for reference.    Procedures and Diagnostic Studies:   CT Renal Stone Study  Result Date: 08/15/2019 CLINICAL DATA:  With gross hematuria since last night, large amount of blood in urine, history prostate cancer 10 years ago, has been having trouble urinating EXAM: CT ABDOMEN AND PELVIS WITHOUT CONTRAST TECHNIQUE: Multidetector CT imaging of the abdomen and pelvis was performed following the standard protocol without IV contrast. Sagittal and coronal MPR images reconstructed from axial data set. No oral contrast administered for this indication  COMPARISON:  05/04/2018 FINDINGS: Lower chest: Emphysematous changes at lung bases with bibasilar reticulonodular interstitial disease. No acute infiltrate or pleural effusion. Hepatobiliary: Multiple calcified gallstones within gallbladder. Liver unremarkable. Pancreas: Normal appearance Spleen: Normal appearance Adrenals/Urinary Tract: Adrenal glands normal appearance. Small BILATERAL renal cysts largest mid RIGHT kidney 3.7 x 3.1 cm. Mild dilatation of LEFT renal collecting system. With mild proximal periureteral edema. No urinary tract calcification or ureteral dilatation identified. Foley catheter within urinary bladder hemorrhage/clot. Stomach/Bowel: Stomach decompressed, suboptimal assessment of wall thickness. Large and small bowel loops unremarkable. Normal appendix. Vascular/Lymphatic: Extensive atherosclerotic changes of aorta, iliac arteries, visceral arteries. Abdominal aortic aneurysm 4.2 x 4.0 cm with prior aneurysm repair, with graft extending to LEFT common iliac artery and RIGHT common femoral artery. Mark aneurysmal dilatation of internal iliac arteries bilaterally, RIGHT 7.8 x 6.8 cm extending 8.8 cm length previously 7.3 x 6.9 cm extending 7.2 cm length and LEFT 6.2 x 5.3 cm previously 5.4 x 4.7 cm. 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 No evidence of aneurysm rupture/hemorrhage. No adenopathy. Reproductive: Minimal prostatic enlargement. Other: No free air or free fluid. No hernia or acute inflammatory process. Musculoskeletal: Osseous demineralization with orthopedic hardware at the proximal femora bilaterally. Degenerative disc disease changes of the lumbar spine. IMPRESSION: Abdominal aortic aneurysm post repair. BILATERAL internal iliac artery aneurysms measuring up to 7.8 x 6.8 cm on RIGHT and 6.2 x 5.3 cm on LEFT, both slightly increased. High attenuation material in urinary bladder consistent with hematoma/clot, uncertain etiology.  BILATERAL renal cysts with mild dilatation of the LEFT renal collecting system though no obstructing calculus is identified, question from UPJ obstruction. Cholelithiasis. 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 Aortic Atherosclerosis (ICD10-I70.0). Aortic aneurysm NOS (ICD10-I71.9). Electronically Signed   By: Lavonia Dana M.D.   On: 08/15/2019 13:48     Labs:   Basic Metabolic Panel: Recent Labs  Lab 08/15/19 1010 08/15/19 1010 08/16/19 0444 08/20/19 0438  NA 136  --  137 137  K 4.4   < > 3.9 3.6  CL 104  --  103 107  CO2 20*  --  21* 24  GLUCOSE 125*  --  147* 102*  BUN 25*  --  28* 18  CREATININE 1.10  --  1.05 0.54*  CALCIUM 10.4*  --  9.5 8.6*   < > = values in this interval not displayed.   GFR Estimated Creatinine Clearance: 68.7 mL/min (A) (by C-G formula based on SCr of 0.54 mg/dL (L)). Liver Function Tests: No  results for input(s): AST, ALT, ALKPHOS, BILITOT, PROT, ALBUMIN in the last 168 hours. No results for input(s): LIPASE, AMYLASE in the last 168 hours. No results for input(s): AMMONIA in the last 168 hours. Coagulation profile No results for input(s): INR, PROTIME in the last 168 hours.  CBC: Recent Labs  Lab 08/15/19 1010 08/16/19 0444 08/20/19 0438  WBC 11.8* 14.2* 5.8  NEUTROABS 10.1*  --  3.9  HGB 12.3* 11.1* 8.9*  HCT 39.4 34.5* 28.5*  MCV 93.8 93.5 94.7  PLT 312 275 216   Cardiac Enzymes: No results for input(s): CKTOTAL, CKMB, CKMBINDEX, TROPONINI in the last 168 hours. BNP: Invalid input(s): POCBNP CBG: No results for input(s): GLUCAP in the last 168 hours. D-Dimer No results for input(s): DDIMER in the last 72 hours. Hgb A1c No results for input(s): HGBA1C in the last 72 hours. Lipid Profile No results for input(s): CHOL, HDL, LDLCALC, TRIG, CHOLHDL, LDLDIRECT in the last 72 hours. Thyroid function studies No results for input(s): TSH, T4TOTAL, T3FREE, THYROIDAB in the last 72 hours.  Invalid input(s):  FREET3 Anemia work up No results for input(s): VITAMINB12, FOLATE, FERRITIN, TIBC, IRON, RETICCTPCT in the last 72 hours. Microbiology Recent Results (from the past 240 hour(s))  Urine culture     Status: Abnormal   Collection Time: 08/15/19 10:52 AM   Specimen: Urine, Random  Result Value Ref Range Status   Specimen Description URINE, RANDOM  Final   Special Requests   Final    NONE Performed at [facility], [address], [place], [place] [address]    Culture >=100,000 COLONIES/mL ENTEROCOCCUS FAECALIS (A)  Final   Report Status 08/17/2019 FINAL  Final   Organism ID, Bacteria ENTEROCOCCUS FAECALIS (A)  Final      Susceptibility   Enterococcus faecalis - MIC*    AMPICILLIN <=2 SENSITIVE Sensitive     NITROFURANTOIN <=16 SENSITIVE Sensitive     VANCOMYCIN 1 SENSITIVE Sensitive     * >=100,000 COLONIES/mL ENTEROCOCCUS FAECALIS  SARS CORONAVIRUS 2 (TAT 6-24 HRS) Nasopharyngeal Nasopharyngeal Swab     Status: Abnormal   Collection Time: 08/15/19  2:44 PM   Specimen: Nasopharyngeal Swab  Result Value Ref Range Status   SARS Coronavirus 2 POSITIVE (A) NEGATIVE Final    Comment: RESULT CALLED TO, READ BACK BY AND VERIFIED WITH: RN J TUTTLE @0356  08/19/19 BY S GEZAHEGN CORRECT RN IS A MORRIS CALLED @0557  08/19/19 BY S GEZAHEGN (NOTE) SARS-CoV-2 target nucleic acids are DETECTED. The SARS-CoV-2 RNA is generally detectable in upper and lower respiratory specimens during the acute phase of infection. Positive results are indicative of the presence of SARS-CoV-2 RNA. Clinical correlation with patient history and other diagnostic information is  necessary to determine patient infection status. Positive results do not rule out bacterial infection or co-infection with other viruses.  The expected result is Negative. Fact Sheet for Patients: SugarRoll.be Fact Sheet for Healthcare Providers: https://www.woods-mathews.com/ This test is not yet  approved or cleared by the [place] FDA and  has been authorized for detection and/or diagnosis of SARS-CoV-2 by FDA under an Emergency Use Authorization (EUA). This EUA  will remain  in effect (meaning this test can be used) for the duration of the COVID-19 declaration under Section 564(b)(1) of the Act, 21 U.S.C. section 360bbb-3(b)(1), unless the authorization is terminated or revoked sooner. Performed at [facility], [place] [address] [place], [place] [address]   Surgical pcr screen     Status: None   Collection  Time: 08/16/19  2:42 AM   Specimen: Nasal Mucosa; Nasal Swab  Result Value Ref Range Status   MRSA, PCR NEGATIVE NEGATIVE Final   Staphylococcus aureus NEGATIVE NEGATIVE Final    Comment: (NOTE) The Xpert SA Assay (FDA approved for NASAL specimens in patients 31 years of age and older), is one component of a comprehensive surveillance program. It is not intended to diagnose infection nor to guide or monitor treatment. Performed at [facility], [place] [address], [place], [place] [address]   Respiratory Panel by RT PCR (Flu A&B, Covid) - Nasopharyngeal Swab     Status: None   Collection Time: 08/18/19  8:42 AM   Specimen: Nasopharyngeal Swab  Result Value Ref Range Status   SARS Coronavirus 2 by RT PCR NEGATIVE NEGATIVE Final    Comment: (NOTE) SARS-CoV-2 target nucleic acids are NOT DETECTED. The SARS-CoV-2 RNA is generally detectable in upper respiratoy specimens during the acute phase of infection. The lowest concentration of SARS-CoV-2 viral copies this assay can detect is 131 copies/mL. A negative result does not preclude SARS-Cov-2 infection and should not be used as the sole basis for treatment or other patient management decisions. A negative result may occur with  improper specimen collection/handling, submission of specimen other than nasopharyngeal swab, presence of viral mutation(s) within the areas targeted by this assay, and inadequate  number of viral copies (<131 copies/mL). A negative result must be combined with clinical observations, patient history, and epidemiological information. The expected result is Negative. Fact Sheet for Patients:  PinkCheek.be Fact Sheet for Healthcare Providers:  GravelBags.it This test is not yet ap proved or cleared by the [place] FDA and  has been authorized for detection and/or diagnosis of SARS-CoV-2 by FDA under an Emergency Use Authorization (EUA). This EUA will remain  in effect (meaning this test can be used) for the duration of the COVID-19 declaration under Section 564(b)(1) of the Act, 21 U.S.C. section 360bbb-3(b)(1), unless the authorization is terminated or revoked sooner.    Influenza A by PCR NEGATIVE NEGATIVE Final   Influenza B by PCR NEGATIVE NEGATIVE Final    Comment: (NOTE) The Xpert Xpress SARS-CoV-2/FLU/RSV assay is intended as an aid in  the diagnosis of influenza from Nasopharyngeal swab specimens and  should not be used as a sole basis for treatment. Nasal washings and  aspirates are unacceptable for Xpert Xpress SARS-CoV-2/FLU/RSV  testing. Fact Sheet for Patients: PinkCheek.be Fact Sheet for Healthcare Providers: GravelBags.it This test is not yet approved or cleared by the [place] FDA and  has been authorized for detection and/or diagnosis of SARS-CoV-2 by  FDA under an Emergency Use Authorization (EUA). This EUA will remain  in effect (meaning this test can be used) for the duration of the  Covid-19 declaration under Section 564(b)(1) of the Act, 21  U.S.C. section 360bbb-3(b)(1), unless the authorization is  terminated or revoked. Performed at [facility], [place] [address] [place], [place] [address]     Please note: You were cared for by a hospitalist during your hospital stay. Once you are discharged,  your primary care physician will handle any further medical issues. Please note that NO REFILLS for any discharge medications will be authorized once you are discharged, as it is imperative that you return to your primary care physician (or establish a relationship with a primary care physician if you do not have one) for your post hospital discharge needs so that they can reassess your need for medications and  monitor your lab values.  Signed: Terrilee Croak  [facility] 08/21/2019, 3:47 PM

## 2019-08-19 NOTE — Progress Notes (Signed)
PROGRESS NOTE  Jesse [name]  DOB: Mar 26, 1928  PCP: [name], MD [id number]  DOA: 08/15/2019 Admitted From: ALF  LOS: 3 days   No chief complaint on file.  Brief narrative: Jesse [name] a 86 y.o.malewith medical history significant ofCAD; nephrolithiasis; HTN; HLD; CAD s/p CABG;mild dementia;AAA; COVID-19 infection in 05/2019; and remote prostate cancertreated with XRT. Patient presented to the ED from ALF on 1/21 with acute onset gross hematuria associated with pelvic pain and cramping.   Subjective: Seen and examined this morning.  Pleasant elderly Caucasian male.  Propped up in bed.  Not in distress.  Underwent cystoscopy yesterday.  Foley catheter removed this morning. Patient wanted to go home today.  Underwent PT evaluation this afternoon.  Patient felt weak and had orthostatic drop in blood pressure.  Hold off discharge today.  Assessment/Plan: Gross hematuria H/o prostate CA -Foley catheter was placed in the ED and was started on CBI.  Urology consulted.   -1/24, patient underwent cystoscopy. 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 He underwent clot evacuation and fulguration of prostatic urethra.   -Hematuria has eventually stopped.  Foley catheter removed today.    Voiding trial ongoing. -Aspirin remains on hold for next 1 week.  Enterococcus faecalis UTI  -Urine culture obtained on admission grew Enterococcus faecalis.  -Patient is currently on IV ampicillin.  Plan to him on 5 more days of oral Augmentin 500 mg twice daily with probiotics.   Orthostatic hypotension History of hypertension -Patient has history of hypertension and is on Coreg at home. -Noted an orthostatic drop in blood pressure today from 116/86 to 87/51.  Likely because of excessive bleeding in the last 48 hours.  We will start the patient on normal saline at 75 mL/h.  Repeat orthostatic in the morning.  Chronic combined  CHF -Continue Coreg.  Not on diuretics at home.   -Patient has grade 1 diastolic dysfunction according to an echocardiogram done January 2019 which shows ejection fraction of 40 to 55% -Compensated CHF.  Hyperlipidemia -Fenofibrate, Zocor.  PVD -patient has multiple known aneurysm including AAA s/p repair.  CT scan shows slight increase in the size of be internal iliac artery aneurysm.  H/o COVID-19 infection -Patient had recent infection on November 2020. He received treatments his repeat test on this admission negative for both PCR and rapid test for Covid  Recent history of hip fractures -He had a left hip fracture with repair in September and a right hip fracture with repair in November.  -PT eval obtained.  Patient feels really weak today.  Home health PT recommended by therapy.  Mobility: Encourage ambulation Diet: Regular diet Fluid: Normal saline at 75 mill per hour DVT prophylaxis:  SCDs Code Status:  DNR per chart Family Communication:  Not at bedside Expected Discharge:  Unable to discharge today because of orthostatic drop in blood pressure and weakness.  Hopefully home with PT tomorrow  Consultants:  Orthopedics  Procedures:  1/24, cystoscopy.  Antimicrobials: Anti-infectives (From admission, onward)   Start     Dose/Rate Route Frequency Ordered Stop   08/19/19 0000  amoxicillin-clavulanate (AUGMENTIN) 500-125 MG tablet     1 tablet Oral 2 times daily 08/19/19 1130 08/24/19 2359   08/17/19 1230  ampicillin (OMNIPEN) 2 g in sodium chloride 0.9 % 100 mL IVPB     2 g 300 mL/hr over 20 Minutes Intravenous Every 6 hours 08/17/19 1202     08/16/19 1500  cefTRIAXone (ROCEPHIN)  1 g in sodium chloride 0.9 % 100 mL IVPB  Status:  Discontinued     1 g 200 mL/hr over 30 Minutes Intravenous Every 24 hours 08/15/19 1553 08/17/19 1202   08/15/19 1245  cefTRIAXone (ROCEPHIN) 1 g in sodium chloride 0.9 % 100 mL IVPB     1 g 200 mL/hr over 30 Minutes Intravenous  Once  08/15/19 1230 08/15/19 1515        Code Status: DNR   Diet Order            Diet regular Room service appropriate? Yes; Fluid consistency: Thin  Diet effective now              Infusions:  . sodium chloride    . ampicillin (OMNIPEN) IV 2 g (08/19/19 0935)    Scheduled Meds: . carvedilol  12.5 mg Oral BID WC  . Chlorhexidine Gluconate Cloth  6 each Topical Daily  . docusate sodium  100 mg Oral BID  . loratadine  10 mg Oral Daily  . pantoprazole  40 mg Oral Daily  . polyvinyl alcohol  2 drop Both Eyes QHS  . simvastatin  40 mg Oral q1800  . sodium chloride  2 spray Each Nare BID    PRN meds: acetaminophen **OR** acetaminophen, fentaNYL (SUBLIMAZE) injection, Melatonin, ondansetron **OR** ondansetron (ZOFRAN) IV   Objective: Vitals:   08/19/19 0509 08/19/19 1340  BP: 130/72 131/73  Pulse: 62 62  Resp: 17 18  Temp: 98.1 F (36.7 C) 98.1 F (36.7 C)  SpO2: 96% 95%    Intake/Output Summary (Last 24 hours) at 08/19/2019 1634 Last data filed at 08/19/2019 1337 Gross per 24 hour  Intake 980 ml  Output 975 ml  Net 5 ml   Filed Weights   08/15/19 0959 08/17/19 0606  Weight: 79.4 kg 78.1 kg   Weight change:  Body mass index is 23.35 kg/m.   Physical Exam: General exam: Appears calm and comfortable.  Skin: No rashes, lesions or ulcers. HEENT: Atraumatic, normocephalic, supple neck, no obvious bleeding Lungs: Clear to auscultation bilaterally CVS: Regular rate and rhythm, no murmur GI/Abd soft, nontender, nondistended present CNS: Alert, awake, oriented x3 Psychiatry: Mood appropriate Extremities: No pedal edema, no calf tenderness  Data Review: I have personally reviewed the laboratory data and studies available.  Recent Labs  Lab 08/15/19 1010 08/16/19 0444  WBC 11.8* 14.2*  NEUTROABS 10.1*  --   HGB 12.3* 11.1*  HCT 39.4 34.5*  MCV 93.8 93.5  PLT 312 275   Recent Labs  Lab 08/15/19 1010 08/16/19 0444  NA 136 137  K 4.4 3.9  CL 104 103    CO2 20* 21*  GLUCOSE 125* 147*  BUN 25* 28*  CREATININE 1.10 1.05  CALCIUM 10.4* 9.5    Terrilee Croak, MD  [facility] 08/19/2019

## 2019-08-19 NOTE — Progress Notes (Signed)
Pt able to void 216mL dark yellow, clear urine. No complaints from pt at this time, will continue to monitor.

## 2019-08-19 NOTE — Progress Notes (Signed)
Called to update daughter, Webb Silversmith, on patient status. All questions/concerns addressed.

## 2019-08-19 NOTE — Evaluation (Addendum)
Physical Therapy Evaluation Patient Details Name: Jesse [name] MRN: [id number] DOB: February 06, 1928 Today's Date: 08/19/2019   History of Present Illness  Jesse [name] is a 86 y.o. male with medical history significant of CAD; nephrolithiasis; HTN; HLD; CAD s/p CABG; mild dementia; AAA; COVID-19 infection in 05/2019; L hip fx 03/2019 and R hip fx 05/2019, and remote prostate cancer treated with XRT presenting with gross hematuria.  Pt s/p cystoscopy with clot evacuation on 08/18/19.  Clinical Impression  Pt admitted with above diagnosis. Evaluation was limited due to orthostatic hypotension.  Pt was able to transfer to chair but required min A and cues for safety.  Required assist for ADLs.  Not safe to d/c to ALF today from PT perspective -would need SNF based on today; however, Pt expected to progress well, as limited by hypotension today (RN notified MD of hypotension). Pt currently with functional limitations due to the deficits listed below (see PT Problem List). Pt will benefit from skilled PT to increase their independence and safety with mobility to allow discharge to the venue listed below.       Follow Up Recommendations Home health PT(return to ALF)    Equipment Recommendations  None recommended by PT    Recommendations for Other Services       Precautions / Restrictions Precautions Precautions: Fall      Mobility  Bed Mobility Overal bed mobility: Needs Assistance Bed Mobility: Supine to Sit     Supine to sit: Min assist     General bed mobility comments: increased time; cues for technique  Transfers Overall transfer level: Needs assistance Equipment used: Rolling walker (2 wheeled) Transfers: Sit to/from [facility] Sit to Stand: Min assist Stand pivot transfers: Min assist       General transfer comment: cues for hand placement; min A for steadying; pt fearful of falling;  sit to stand x 4 and stand pivot x 2  Ambulation/Gait Ambulation/Gait  assistance: Min assist Gait Distance (Feet): 5 Feet Assistive device: Rolling walker (2 wheeled) Gait Pattern/deviations: Decreased stride length     General Gait Details: mild unsteadiness, steps to chair only -limited due to orthostatic hypotension  Stairs            Wheelchair Mobility    Modified Rankin (Stroke Patients Only)       Balance Overall balance assessment: Needs assistance Sitting-balance support: Bilateral upper extremity supported;Feet supported Sitting balance-Leahy Scale: Good     Standing balance support: Bilateral upper extremity supported;During functional activity Standing balance-Leahy Scale: Fair Standing balance comment: reports dizziness; required RW                             Pertinent Vitals/Pain Pain Assessment: No/denies pain   BP sitting 116/86 BP standing 87/51 c/o dizziness BP standing 3 mins 89/66 still dizzy Returned to sitting and symptoms improved     Home Living Family/patient expects to be discharged to:: Assisted living               Home Equipment: Walker - 2 wheels      Prior Function Level of Independence: Needs assistance   Gait / Transfers Assistance Needed: Reports walking with walker and working with therapy after hip fx in [place]..  Also used w/c at times.  ADL's / Homemaking Assistance Needed: Could get dressed and performed toielting independently but reports very cautious due to FOF.  Staff provided supervision for showers.  Hand Dominance        Extremity/Trunk Assessment   Upper Extremity Assessment Upper Extremity Assessment: Overall WFL for tasks assessed    Lower Extremity Assessment Lower Extremity Assessment: LLE deficits/detail;RLE deficits/detail RLE Deficits / Details: ROM WFL; MMT hip 4-/5, knee and ankle 5/5 LLE Deficits / Details: ROM WFL; MMT hip 4/5, knee and ankle 5/5    Cervical / Trunk Assessment Cervical / Trunk Assessment: Normal  Communication    Communication: HOH  Cognition Arousal/Alertness: Awake/alert Behavior During Therapy: WFL for tasks assessed/performed Overall Cognitive Status: Within Functional Limits for tasks assessed                                        General Comments General comments (skin integrity, edema, etc.): Required assist for ADLs    Exercises     Assessment/Plan    PT Assessment Patient needs continued PT services  PT Problem List Decreased strength;Decreased mobility;Decreased activity tolerance;Cardiopulmonary status limiting activity;Decreased balance;Decreased knowledge of use of DME;Decreased range of motion       PT Treatment Interventions DME instruction;Therapeutic activities;Gait training;Therapeutic exercise;Patient/family education;Balance training;Functional mobility training    PT Goals (Current goals can be found in the Care Plan section)  Acute Rehab PT Goals Patient Stated Goal: return to ALF PT Goal Formulation: With patient Time For Goal Achievement: 09/02/19 Potential to Achieve Goals: Good    Frequency Min 3X/week   Barriers to discharge Decreased caregiver support      Co-evaluation               AM-PAC PT "6 Clicks" Mobility  Outcome Measure Help needed turning from your back to your side while in a flat bed without using bedrails?: A Little Help needed moving from lying on your back to sitting on the side of a flat bed without using bedrails?: A Little Help needed moving to and from a bed to a chair (including a wheelchair)?: A Little Help needed standing up from a chair using your arms (e.g., wheelchair or bedside chair)?: A Little Help needed to walk in hospital room?: A Little Help needed climbing 3-5 steps with a railing? : A Lot 6 Click Score: 17    End of Session Equipment Utilized During Treatment: Gait belt Activity Tolerance: Other (comment)(limited by orthostatic hypotension) Patient left: in chair;with call bell/phone  within reach Nurse Communication: Mobility status(BP) PT Visit Diagnosis: Unsteadiness on feet (R26.81);Muscle weakness (generalized) (M62.81);History of falling (Z91.81)    Time: 1500-1536 PT Time Calculation (min) (ACUTE ONLY): 36 min   Charges:   PT Evaluation $PT Eval Moderate Complexity: 1 Mod PT Treatments $Therapeutic Activity: 8-22 mins        Maggie Font, PT Acute Rehab Services Pager [phone] [facility] Re[facility] [phone] [facility] [phone]   Karlton Lemon 08/19/2019, 3:47 PM

## 2019-08-20 LAB — CBC WITH DIFFERENTIAL/PLATELET
Abs Immature Granulocytes: 0.02 10*3/uL (ref 0.00–0.07)
Basophils Absolute: 0.1 10*3/uL (ref 0.0–0.1)
Basophils Relative: 1 %
Eosinophils Absolute: 0.6 10*3/uL — ABNORMAL HIGH (ref 0.0–0.5)
Eosinophils Relative: 10 %
HCT: 28.5 % — ABNORMAL LOW (ref 39.0–52.0)
Hemoglobin: 8.9 g/dL — ABNORMAL LOW (ref 13.0–17.0)
Immature Granulocytes: 0 %
Lymphocytes Relative: 11 %
Lymphs Abs: 0.6 10*3/uL — ABNORMAL LOW (ref 0.7–4.0)
MCH: 29.6 pg (ref 26.0–34.0)
MCHC: 31.2 g/dL (ref 30.0–36.0)
MCV: 94.7 fL (ref 80.0–100.0)
Monocytes Absolute: 0.6 10*3/uL (ref 0.1–1.0)
Monocytes Relative: 11 %
Neutro Abs: 3.9 10*3/uL (ref 1.7–7.7)
Neutrophils Relative %: 67 %
Platelets: 216 10*3/uL (ref 150–400)
RBC: 3.01 MIL/uL — ABNORMAL LOW (ref 4.22–5.81)
RDW: 13.6 % (ref 11.5–15.5)
WBC: 5.8 10*3/uL (ref 4.0–10.5)
nRBC: 0 % (ref 0.0–0.2)

## 2019-08-20 LAB — BASIC METABOLIC PANEL
Anion gap: 6 (ref 5–15)
BUN: 18 mg/dL (ref 8–23)
CO2: 24 mmol/L (ref 22–32)
Calcium: 8.6 mg/dL — ABNORMAL LOW (ref 8.9–10.3)
Chloride: 107 mmol/L (ref 98–111)
Creatinine, Ser: 0.54 mg/dL — ABNORMAL LOW (ref 0.61–1.24)
GFR calc Af Amer: >60 mL/min (ref >60)
GFR calc non Af Amer: >60 mL/min (ref >60)
Glucose, Bld: 102 mg/dL — ABNORMAL HIGH (ref 70–99)
Potassium: 3.6 mmol/L (ref 3.5–5.1)
Sodium: 137 mmol/L (ref 135–145)

## 2019-08-20 NOTE — Progress Notes (Signed)
PROGRESS NOTE  Jesse [name]  DOB: 04-03-1928  PCP: [name], MD [id number]  DOA: 08/15/2019 Admitted From: ALF  LOS: 4 days   No chief complaint on file.  Brief narrative: Jesse [name] a 86 y.o.malewith medical history significant ofCAD; nephrolithiasis; HTN; HLD; CAD s/p CABG;mild dementia;AAA; COVID-19 infection in 05/2019; and remote prostate cancertreated with XRT. Patient presented to the ED from ALF on 1/21 with acute onset gross hematuria associated with pelvic pain and cramping.   Subjective: Seen and examined this morning.  Pleasant elderly Caucasian male.    Sitting up in chair.  Feels weak.  He had 3 episodes of loose bowel movement this morning.  Does not feel comfortable going home today.  Assessment/Plan: Gross hematuria H/o prostate CA -Foley catheter was placed in the ED and was started on CBI.  Urology consulted.   -1/24, patient underwent cystoscopy. 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 He underwent clot evacuation and fulguration of prostatic urethra.   -Hematuria has eventually stopped.  Foley catheter removed on 1/25. Voiding trial done.  -Aspirin remains on hold for next 1 week.  Enterococcus faecalis UTI  -Urine culture obtained on admission grew Enterococcus faecalis.  -Patient is currently on IV ampicillin.  Plan to him on 5 more days of oral Augmentin 500 mg twice daily with probiotics.   Orthostatic hypotension History of hypertension -Patient has history of hypertension and is on Coreg at home. -1/25, he was noted to have an orthostatic drop in blood pressure today from 116/86 to 87/51.  Likely because of excessive bleeding in the last 48 hours.  Started on hydration with normal saline 75 mL/h which we will continue.    Chronic combined CHF -Continue Coreg.  Not on diuretics at home.   -Patient has grade 1 diastolic dysfunction according to an echocardiogram done January 2019  which shows ejection fraction of 40 to 55% -Compensated CHF.  Hyperlipidemia -Fenofibrate, Zocor.  PVD -patient has multiple known aneurysm including AAA s/p repair.  CT scan shows slight increase in the size of be internal iliac artery aneurysm.  H/o COVID-19 infection -Patient had recent infection on November 2020. He received treatments and his repeat test on this admission negative for both PCR and rapid test for Covid  Recent history of hip fractures -He had a left hip fracture with repair in September and a right hip fracture with repair in November.  -PT eval obtained.  Patient feels really weak today.  Home health PT recommended by therapy.  Mobility: Encourage ambulation Diet: Regular diet Fluid: Normal saline at 75 mill per hour DVT prophylaxis:  SCDs Code Status:  DNR per chart Family Communication:  Not at bedside Expected Discharge:  Unable to discharge today because of weakness, diarrhea.  Continue to monitor.  Hopefully back to ALF with PT tomorrow  Consultants:  Orthopedics  Procedures:  1/24, cystoscopy.  Antimicrobials: Anti-infectives (From admission, onward)   Start     Dose/Rate Route Frequency Ordered Stop   08/19/19 0000  amoxicillin-clavulanate (AUGMENTIN) 500-125 MG tablet     1 tablet Oral 2 times daily 08/19/19 1130 08/24/19 2359   08/17/19 1230  ampicillin (OMNIPEN) 2 g in sodium chloride 0.9 % 100 mL IVPB     2 g 300 mL/hr over 20 Minutes Intravenous Every 6 hours 08/17/19 1202     08/16/19 1500  cefTRIAXone (ROCEPHIN) 1 g in sodium chloride 0.9 % 100 mL IVPB  Status:  Discontinued  1 g 200 mL/hr over 30 Minutes Intravenous Every 24 hours 08/15/19 1553 08/17/19 1202   08/15/19 1245  cefTRIAXone (ROCEPHIN) 1 g in sodium chloride 0.9 % 100 mL IVPB     1 g 200 mL/hr over 30 Minutes Intravenous  Once 08/15/19 1230 08/15/19 1515        Code Status: DNR   Diet Order            Diet regular Room service appropriate? Yes; Fluid  consistency: Thin  Diet effective now              Infusions:  . sodium chloride 75 mL/hr at 08/19/19 1648  . ampicillin (OMNIPEN) IV 2 g (08/20/19 0907)    Scheduled Meds: . carvedilol  12.5 mg Oral BID WC  . Chlorhexidine Gluconate Cloth  6 each Topical Daily  . docusate sodium  100 mg Oral BID  . loratadine  10 mg Oral Daily  . pantoprazole  40 mg Oral Daily  . polyvinyl alcohol  2 drop Both Eyes QHS  . simvastatin  40 mg Oral q1800  . sodium chloride  2 spray Each Nare BID    PRN meds: acetaminophen **OR** acetaminophen, fentaNYL (SUBLIMAZE) injection, Melatonin, ondansetron **OR** ondansetron (ZOFRAN) IV   Objective: Vitals:   08/20/19 0905 08/20/19 1223  BP: 121/89 128/68  Pulse: 69 68  Resp:  16  Temp:  98 F (36.7 C)  SpO2:  98%    Intake/Output Summary (Last 24 hours) at 08/20/2019 1425 Last data filed at 08/20/2019 0600 Gross per 24 hour  Intake 1301.18 ml  Output 475 ml  Net 826.18 ml   Filed Weights   08/15/19 0959 08/17/19 0606  Weight: 79.4 kg 78.1 kg   Weight change:  Body mass index is 23.35 kg/m.   Physical Exam: General exam: Appears calm and comfortable.  Feels weak. Skin: No rashes, lesions or ulcers. HEENT: Atraumatic, normocephalic, supple neck, no obvious bleeding Lungs: Clear to auscultation bilaterally CVS: Regular rate and rhythm, no murmur GI/Abd soft, nontender, nondistended present CNS: Alert, awake, oriented x3 Psychiatry: Mood appropriate Extremities: No pedal edema, no calf tenderness  Data Review: I have personally reviewed the laboratory data and studies available.  Recent Labs  Lab 08/15/19 1010 08/16/19 0444 08/20/19 0438  WBC 11.8* 14.2* 5.8  NEUTROABS 10.1*  --  3.9  HGB 12.3* 11.1* 8.9*  HCT 39.4 34.5* 28.5*  MCV 93.8 93.5 94.7  PLT 312 275 216   Recent Labs  Lab 08/15/19 1010 08/16/19 0444 08/20/19 0438  NA 136 137 137  K 4.4 3.9 3.6  CL 104 103 107  CO2 20* 21* 24  GLUCOSE 125* 147* 102*  BUN  25* 28* 18  CREATININE 1.10 1.05 0.54*  CALCIUM 10.4* 9.5 8.6*    Terrilee Croak, MD  [facility] 08/20/2019

## 2019-08-20 NOTE — TOC Initial Note (Signed)
Transition of Care [facility]) - Initial/Assessment Note    Patient Details  Name: Jesse [name] MRN: [id number] Date of Birth: 11-27-1927  [facility]) CM/SW Contact:    Dessa Phi, RN Phone Number: 08/20/2019, 11:31 AM  Clinical Narrative: Patient plans to d/c back to ALF-[facility] [place]-[facility] able to accept back-they have their own transp back-patient in agreement.last covid 1/24 neg.fl2 sent.DNR in shadow chart for MD signature.                  Expected Discharge Plan: [place] Barriers to Discharge: Continued Medical Work up   Patient Goals and CMS Choice Patient states their goals for this hospitalization and ongoing recovery are:: return back to ALF      Expected Discharge Plan and Services Expected Discharge Plan: [place]   Discharge Planning Services: CM Consult   Living arrangements for the past 2 months: [place]                                      Prior Living Arrangements/Services Living arrangements for the past 2 months: [place] Lives with:: Facility Resident Patient language and need for interpreter reviewed:: Yes Do you feel safe going back to the place where you live?: Yes      Need for Family Participation in Patient Care: No (Comment) Care giver support system in place?: Yes (comment) Current home services: DME(rw,w/c) Criminal Activity/Legal Involvement Pertinent to Current Situation/Hospitalization: No - Comment as needed  Activities of Daily Living Home Assistive Devices/Equipment: Cane (specify quad or straight), Walker (specify type) ADL Screening (condition at time of admission) Patient's cognitive ability adequate to safely complete daily activities?: Yes Is the patient deaf or have difficulty hearing?: Yes Does the patient have difficulty seeing, even when wearing glasses/contacts?: No Does the patient have difficulty concentrating, remembering, or  making decisions?: No Patient able to express need for assistance with ADLs?: Yes Does the patient have difficulty dressing or bathing?: No Independently performs ADLs?: No Does the patient have difficulty walking or climbing stairs?: Yes Weakness of Legs: Both Weakness of Arms/Hands: None  Permission Sought/Granted Permission sought to share information with : Case Manager Permission granted to share information with : Yes, Verbal Permission Granted  Share Information with NAME: Case Manager  Permission granted to share info w AGENCY: [facility] [place] [facility] [place] granted to share info w Relationship: [name] son [phone]     Emotional Assessment Appearance:: Appears stated age Attitude/Demeanor/Rapport: Gracious Affect (typically observed): Accepting Orientation: : Oriented to Self, Oriented to Place, Oriented to  Time, Oriented to Situation Alcohol / Substance Use: Not Applicable Psych Involvement: No (comment)  Admission diagnosis:  Gross hematuria [R31.0] Hematuria of unknown cause [R31.9] Patient Active Problem List   Diagnosis Date Noted  . Gross hematuria 08/15/2019  . History of prostate cancer 08/15/2019  . Anxiety 06/21/2019  . Anemia due to chronic illness 06/20/2019  . Pneumonia due to COVID-19 virus 06/20/2019  . Displaced spiral fracture of shaft of right femur, initial encounter for open fracture type I or II ([place]) 06/20/2019  . COVID-19 virus infection 06/19/2019  . Surgery, elective   . Hip fracture ([place]) 04/15/2019  . Fall   . Coronary artery disease involving native coronary artery of native heart without angina pectoris 11/20/2018  . Thoracic aortic aneurysm without rupture (  [place]) 11/20/2018  . Chronic systolic heart failure ([place]) 11/20/2018  . Duodenal ulcer with hemorrhage 05/05/2018  . Hemorrhagic shock ([place]) 05/05/2018  . AKI (acute kidney injury) ([place]) 05/05/2018  . Orthostatic hypotension 05/04/2018  . Symptomatic anemia 05/04/2018  .  Melena 05/04/2018  . GI bleed 05/04/2018  . Patella fracture 06/09/2015  . Aftercare following surgery of the circulatory system, [place] 01/08/2013  . Pain in limb- Left popliteal 12/25/2012  . Aneurysm artery, femoral ([place]) 05/22/2012  . 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 Aneurysm of abdominal vessel ([place]) 02/21/2012  . Aneurysm of iliac artery ([facility]) 08/09/2011  . Aneurysm of artery of lower extremity ([place]) 08/09/2011  . HLD (hyperlipidemia) 01/16/2009  . Essential hypertension 01/16/2009  . CAD, ARTERY BYPASS GRAFT 01/16/2009  . PERIPHERAL VASCULAR DISEASE 01/16/2009  . ABDOMINAL AORTIC ANEURYSM REPAIR, HX OF 01/16/2009  . MIXED HYPERLIPIDEMIA 12/28/2008   PCP:  [name], MD Pharmacy:   [facility] ([place]) [place], [address] [place] [address] Phone: [phone] Fax: [phone]  [place], [place] [address] Phone: [phone] Fax: [phone]  [facility] [place], [place] [address] Phone: [phone] Fax: [phone]     Social Determinants of Health (SDOH) Interventions    Readmission Risk Interventions No flowsheet data found.

## 2019-08-20 NOTE — NC FL2 (Signed)
[place] LEVEL OF CARE SCREENING TOOL     IDENTIFICATION  Patient Name: Jesse [name] Birthdate: [birth year] Sex: male Admission Date (Current Location): 08/15/2019  [facility] and [place] Number:  Herbalist and Address:  [facility],  [place] [address] [place]      Provider Number: [id number]  Attending Physician Name and Address:  Terrilee Croak, MD  Relative Name and Phone Number:  [place] Buscaglia [phone]    Current Level of Care: Hospital Recommended Level of Care: [place] Prior Approval Number:    Date Approved/Denied:   PASRR Number:    Discharge Plan: Other (Comment)(ALF)    Current Diagnoses: Patient Active Problem List   Diagnosis Date Noted  . Gross hematuria 08/15/2019  . History of prostate cancer 08/15/2019  . Anxiety 06/21/2019  . Anemia due to chronic illness 06/20/2019  . Pneumonia due to COVID-19 virus 06/20/2019  . Displaced spiral fracture of shaft of right femur, initial encounter for open fracture type I or II ([place]) 06/20/2019  . COVID-19 virus infection 06/19/2019  . Surgery, elective   . Hip fracture ([place]) 04/15/2019  . Fall   . Coronary artery disease involving native coronary artery of native heart without angina pectoris 11/20/2018  . Thoracic aortic aneurysm without rupture ([place]) 11/20/2018  . Chronic systolic heart failure ([place]) 11/20/2018  . Duodenal ulcer with hemorrhage 05/05/2018  . Hemorrhagic shock ([place]) 05/05/2018  . AKI (acute kidney injury) ([place]) 05/05/2018  . Orthostatic hypotension 05/04/2018  . Symptomatic anemia 05/04/2018  . Melena 05/04/2018  . GI bleed 05/04/2018  . Patella fracture 06/09/2015  . Aftercare following surgery of the circulatory system, [place] 01/08/2013  . Pain in limb- Left popliteal 12/25/2012  . Aneurysm artery, femoral ([place]) 05/22/2012  . 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 Aneurysm of abdominal vessel ([place]) 02/21/2012  . Aneurysm  of iliac artery (HCC) 08/09/2011  . Aneurysm of artery of lower extremity ([place]) 08/09/2011  . HLD (hyperlipidemia) 01/16/2009  . Essential hypertension 01/16/2009  . CAD, ARTERY BYPASS GRAFT 01/16/2009  . PERIPHERAL VASCULAR DISEASE 01/16/2009  . ABDOMINAL AORTIC ANEURYSM REPAIR, HX OF 01/16/2009  . MIXED HYPERLIPIDEMIA 12/28/2008    Orientation RESPIRATION BLADDER Height & Weight     Self, Time, Situation, Place  Normal Continent Weight: 78.1 kg Height:  6' (182.9 cm)  BEHAVIORAL SYMPTOMS/MOOD NEUROLOGICAL BOWEL NUTRITION STATUS      Continent    AMBULATORY STATUS COMMUNICATION OF NEEDS Skin   Limited Assist Verbally Normal                       Personal Care Assistance Level of Assistance  Bathing, Feeding, Dressing Bathing Assistance: Limited assistance Feeding assistance: Limited assistance Dressing Assistance: Limited assistance     Functional Limitations Info  Sight, Hearing(eyeglasses) Sight Info: Impaired Hearing Info: Impaired(bilateral HOH;hearing aids)      SPECIAL CARE FACTORS FREQUENCY  PT (By licensed PT), OT (By licensed OT)     PT Frequency: 5x week OT Frequency: 5x week            Contractures Contractures Info: Not present    Additional Factors Info  Code Status, Allergies Code Status Info: DNR Allergies Info: Quinolones, Adhesive           Current Medications (08/20/2019):  This is the current hospital active medication list Current Facility-Administered Medications  Medication Dose Route Frequency Provider Last Rate Last Admin  . 0.9 %  sodium chloride infusion   Intravenous Continuous Terrilee Croak, MD 75 mL/hr at 08/19/19 1648 New Bag at 08/19/19 1648  . acetaminophen (TYLENOL) tablet 650 mg  650 mg Oral Q6H PRN Cleon Gustin, MD   650 mg at 08/19/19 2121   Or  . acetaminophen (TYLENOL) suppository 650 mg  650 mg Rectal Q6H PRN Cleon Gustin, MD      . ampicillin (OMNIPEN) 2 g in sodium chloride 0.9 % 100 mL IVPB  2  g Intravenous Q6H Cleon Gustin, MD 300 mL/hr at 08/20/19 0907 2 g at 08/20/19 0907  . carvedilol (COREG) tablet 12.5 mg  12.5 mg Oral BID WC Cleon Gustin, MD   12.5 mg at 08/20/19 0905  . Chlorhexidine Gluconate Cloth 2 % PADS 6 each  6 each Topical Daily McKenzie, Candee Furbish, MD   6 each at 08/18/19 1047  . docusate sodium (COLACE) capsule 100 mg  100 mg Oral BID Cleon Gustin, MD   100 mg at 08/19/19 2120  . fentaNYL (SUBLIMAZE) injection 25 mcg  25 mcg Intravenous Q2H PRN Cleon Gustin, MD   25 mcg at 08/18/19 0023  . loratadine (CLARITIN) tablet 10 mg  10 mg Oral Daily Cleon Gustin, MD   10 mg at 08/20/19 0905  . Melatonin TABS 3 mg  3 mg Oral QHS PRN Cleon Gustin, MD   3 mg at 08/18/19 2245  . ondansetron (ZOFRAN) tablet 4 mg  4 mg Oral Q6H PRN Cleon Gustin, MD       Or  . ondansetron [facility]) injection 4 mg  4 mg Intravenous Q6H PRN Cleon Gustin, MD      . pantoprazole (PROTONIX) EC tablet 40 mg  40 mg Oral Daily Cleon Gustin, MD   40 mg at 08/20/19 0906  . polyvinyl alcohol (LIQUIFILM TEARS) 1.4 % ophthalmic solution 2 drop  2 drop Both Eyes QHS Cleon Gustin, MD   2 drop at 08/19/19 2122  . simvastatin (ZOCOR) tablet 40 mg  40 mg Oral q1800 Cleon Gustin, MD   40 mg at 08/19/19 1740  . sodium chloride (OCEAN) 0.65 % nasal spray 2 spray  2 spray Each Nare BID Cleon Gustin, MD   2 spray at 08/20/19 [id number]     Discharge Medications: Please see discharge summary for a list of discharge medications.  Relevant Imaging Results:  Relevant Lab Results:   Additional Information ssn# [id number]  Dessa Phi, RN

## 2019-08-20 NOTE — Progress Notes (Signed)
Physical Therapy Treatment Patient Details Name: Jesse [name] MRN: [id number] DOB: Aug 31, 1927 Today's Date: 08/20/2019    History of Present Illness 86 yo male admitted with gross hematuria s/p cystoscopy with clot evacuation 08/18/19. Hx of L hip fx 03/2019, R hip fx 05/2019, mild dementia, AAA, COVID-19, CAD    PT Comments    Progressing with mobility. Pt needs some encouragement to progress activity. He stated his legs feel weak and he fatigues fairly easily. He plans to return to ALF. Likely okay to return as long as facility can provide current level of assistance.     Follow Up Recommendations  Home health PT(at ALF if facility can provide current level of care. May require increased ADL assistance than he had PTA)     Equipment Recommendations  None recommended by PT    Recommendations for Other Services       Precautions / Restrictions Precautions Precautions: Fall Restrictions Weight Bearing Restrictions: No    Mobility  Bed Mobility               General bed mobility comments: oob in recliner  Transfers Overall transfer level: Needs assistance Equipment used: Rolling walker (2 wheeled) Transfers: Sit to/from Stand Sit to Stand: Min guard         General transfer comment: increased time. cues for safety, hand plaement.  Ambulation/Gait Ambulation/Gait assistance: Min guard;Min assist Gait Distance (Feet): 50 Feet Assistive device: Rolling walker (2 wheeled) Gait Pattern/deviations: Step-through pattern;Decreased stride length     General Gait Details: Slow gait speed. Intermittent assist given to steady. Pt denied dizziness. Pt self-limited ambulation distance.   Stairs             Wheelchair Mobility    Modified Rankin (Stroke Patients Only)       Balance Overall balance assessment: Needs assistance;History of Falls         Standing balance support: Bilateral upper extremity supported Standing balance-Leahy Scale: Poor                              Cognition Arousal/Alertness: Awake/alert Behavior During Therapy: WFL for tasks assessed/performed Overall Cognitive Status: Within Functional Limits for tasks assessed                                        Exercises      General Comments        Pertinent Vitals/Pain Pain Assessment: No/denies pain    Home Living                      Prior Function            PT Goals (current goals can now be found in the care plan section) Progress towards PT goals: Progressing toward goals    Frequency    Min 3X/week      PT Plan Current plan remains appropriate    Co-evaluation              AM-PAC PT "6 Clicks" Mobility   Outcome Measure  Help needed turning from your back to your side while in a flat bed without using bedrails?: A Little Help needed moving from lying on your back to sitting on the side of a flat bed without using bedrails?: A Little Help needed moving to and from a bed to  a chair (including a wheelchair)?: A Little Help needed standing up from a chair using your arms (e.g., wheelchair or bedside chair)?: A Little Help needed to walk in hospital room?: A Little Help needed climbing 3-5 steps with a railing? : A Lot 6 Click Score: 17    End of Session Equipment Utilized During Treatment: Gait belt Activity Tolerance: Patient tolerated treatment well Patient left: in chair;with call bell/phone within reach   PT Visit Diagnosis: Muscle weakness (generalized) (M62.81);History of falling (Z91.81);Difficulty in walking, not elsewhere classified (R26.2);Unsteadiness on feet (R26.81)     Time: 1046-1100 PT Time Calculation (min) (ACUTE ONLY): 14 min  Charges:  $Gait Training: 8-22 mins                         Doreatha Massed, PT Acute Rehabilitation

## 2019-08-21 NOTE — Progress Notes (Signed)
Patient is not on Lovenox in this hospital stay and he does not need to be on Lovenox post discharge.  Discharge summary and reconciliation corrected. Informed case manager Ms. Juliann Pulse.

## 2019-08-21 NOTE — TOC Progression Note (Signed)
Transition of Care [facility]) - Progression Note    Patient Details  Name: Jesse [name] MRN: [id number] Date of Birth: January 07, 1928  Transition of Care [facility]) CM/SW Contact  Liona Wengert, Juliann Pulse, RN Phone Number: 08/21/2019, 3:46 PM  Clinical Narrative:  The lovenox on the d/c summary was not given @ [facility] @ [facility] or the hospital. They will not give it @ [place]. MD/Nsg updated. No need for [facility] nursing.Thanks      Expected Discharge Plan: [place] Barriers to Discharge: No Barriers Identified  Expected Discharge Plan and Services Expected Discharge Plan: [place]   Discharge Planning Services: CM Consult   Living arrangements for the past 2 months: [place] Expected Discharge Date: 08/21/19                         HH Arranged: PT HH Agency: ([facility] has own [place] agency-[facility] for HHPT) Date HH Agency Contacted: 08/21/19 Time [place]: 1133 Representative spoke with at [place]: [place] ([place]) Interventions    Readmission Risk Interventions No flowsheet data found.

## 2019-08-21 NOTE — TOC Transition Note (Signed)
Transition of Care [facility]) - CM/SW Discharge Note   Patient Details  Name: BRELAND CASILLO MRN: [id number] Date of Birth: [birth year]  Transition of Care [facility]) CM/SW Contact:  Dessa Phi, RN Phone Number: 08/21/2019, 11:33 AM   Clinical Narrative: Patient for return back to [facility] @ [place]-rep Keshia aware of return back to ALF w/HHPT-[facility]-faxed HHPT order w/coffirmation-they have their own transport-they will call the floor (pick up time is 1p)when they arrive-nurse to call report to [phone]. No further CM needs.      Final next level of care: Assisted Living Barriers to Discharge: No Barriers Identified   Patient Goals and CMS Choice Patient states their goals for this hospitalization and ongoing recovery are:: return back to ALF      Discharge Placement                       Discharge Plan and Services   Discharge Planning Services: CM Consult                      HH Arranged: PT HH Agency: ([facility] has own [facility] agency-[place] for HHPT) Date HH Agency Contacted: 08/21/19 Time [place]: 1133 Representative spoke with at [place]: [place] ([place]) Interventions     Readmission Risk Interventions No flowsheet data found.

## 2019-08-21 NOTE — Progress Notes (Signed)
Physical Therapy Treatment Patient Details Name: Jesse [name] MRN: [id number] DOB: [birth year] Today's Date: 08/21/2019    History of Present Illness 86 yo male admitted with gross hematuria s/p cystoscopy with clot evacuation 08/18/19. Hx of L hip fx 03/2019, R hip fx 05/2019, mild dementia, AAA, COVID-19, CAD    PT Comments    Progressing with mobility. Possible d/c home today.    Follow Up Recommendations  Home health PT(at ALF-may need some increased assistance until he returns to baseline)     Equipment Recommendations  None recommended by PT    Recommendations for Other Services       Precautions / Restrictions Precautions Precautions: Fall Restrictions Weight Bearing Restrictions: No    Mobility  Bed Mobility               General bed mobility comments: oob in recliner  Transfers Overall transfer level: Needs assistance Equipment used: Rolling walker (2 wheeled) Transfers: Sit to/from Stand   Stand pivot transfers: Min guard       General transfer comment: increased time.  Ambulation/Gait Ambulation/Gait assistance: Min guard;Min assist Gait Distance (Feet): 85 Feet Assistive device: Rolling walker (2 wheeled) Gait Pattern/deviations: Step-through pattern;Decreased stride length;Trunk flexed     General Gait Details: Slow gait speed. Intermittent assist given to steady. Pt denied dizziness.   Stairs             Wheelchair Mobility    Modified Rankin (Stroke Patients Only)       Balance Overall balance assessment: Needs assistance;History of Falls         Standing balance support: Bilateral upper extremity supported Standing balance-Leahy Scale: Poor                              Cognition Arousal/Alertness: Awake/alert Behavior During Therapy: WFL for tasks assessed/performed Overall Cognitive Status: Within Functional Limits for tasks assessed                                        Exercises  General Exercises - Lower Extremity Ankle Circles/Pumps: AROM;Both;10 reps;Seated Quad Sets: AROM;Both;10 reps;Seated Long Arc Quad: AROM;Both;10 reps;Seated Hip ABduction/ADduction: AROM;Right;10 reps;Seated Hip Flexion/Marching: AROM;Both;10 reps;Seated    General Comments        Pertinent Vitals/Pain Pain Assessment: Faces Faces Pain Scale: Hurts a little bit Pain Location: generalized Pain Descriptors / Indicators: Sore Pain Intervention(s): Monitored during session    Home Living                      Prior Function            PT Goals (current goals can now be found in the care plan section) Progress towards PT goals: Progressing toward goals    Frequency    Min 3X/week      PT Plan Current plan remains appropriate    Co-evaluation              AM-PAC PT "6 Clicks" Mobility   Outcome Measure  Help needed turning from your back to your side while in a flat bed without using bedrails?: A Little Help needed moving from lying on your back to sitting on the side of a flat bed without using bedrails?: A Little Help needed moving to and from a bed to a chair (including a wheelchair)?: A Little  Help needed standing up from a chair using your arms (e.g., wheelchair or bedside chair)?: A Little Help needed to walk in hospital room?: A Little Help needed climbing 3-5 steps with a railing? : A Lot 6 Click Score: 17    End of Session Equipment Utilized During Treatment: Gait belt Activity Tolerance: Patient tolerated treatment well Patient left: in chair;with call bell/phone within reach   PT Visit Diagnosis: Muscle weakness (generalized) (M62.81);History of falling (Z91.81);Difficulty in walking, not elsewhere classified (R26.2);Unsteadiness on feet (R26.81)     Time: [id number] PT Time Calculation (min) (ACUTE ONLY): 16 min  Charges:  $Gait Training: 8-22 mins                         Doreatha Massed, PT Acute Rehabilitation

## 2019-08-21 NOTE — TOC Transition Note (Signed)
Transition of Care [facility]) - CM/SW Discharge Note   Patient Details  Name: Jesse [name] MRN: [id number] Date of Birth: May 27, 1928  Transition of Care [facility]) CM/SW Contact:  Dessa Phi, RN Phone Number: 08/21/2019, 2:49 PM   Clinical Narrative:Received call to fax d/c summary-confirmed received. Fax#[phone].       Final next level of care: Assisted Living Barriers to Discharge: No Barriers Identified   Patient Goals and CMS Choice Patient states their goals for this hospitalization and ongoing recovery are:: return back to ALF      Discharge Placement                       Discharge Plan and Services   Discharge Planning Services: CM Consult                      HH Arranged: PT HH Agency: ([facility] has own [facility] agency-[place] for HHPT) Date HH Agency Contacted: 08/21/19 Time [place]: 1133 Representative spoke with at [place]: [place] ([place]) Interventions     Readmission Risk Interventions No flowsheet data found.

## 2019-08-22 DIAGNOSIS — D638 Anemia in other chronic diseases classified elsewhere: Secondary | ICD-10-CM | POA: Diagnosis not present

## 2019-08-22 DIAGNOSIS — I10 Essential (primary) hypertension: Secondary | ICD-10-CM | POA: Diagnosis not present

## 2019-08-22 DIAGNOSIS — I25810 Atherosclerosis of coronary artery bypass graft(s) without angina pectoris: Secondary | ICD-10-CM | POA: Diagnosis not present

## 2019-08-22 DIAGNOSIS — F0280 Dementia in other diseases classified elsewhere without behavioral disturbance: Secondary | ICD-10-CM | POA: Diagnosis not present

## 2019-08-23 DIAGNOSIS — R296 Repeated falls: Secondary | ICD-10-CM | POA: Diagnosis not present

## 2019-08-23 DIAGNOSIS — R2689 Other abnormalities of gait and mobility: Secondary | ICD-10-CM | POA: Diagnosis not present

## 2019-08-23 DIAGNOSIS — M6281 Muscle weakness (generalized): Secondary | ICD-10-CM | POA: Diagnosis not present

## 2019-08-24 DIAGNOSIS — I739 Peripheral vascular disease, unspecified: Secondary | ICD-10-CM | POA: Diagnosis not present

## 2019-08-24 DIAGNOSIS — M2041 Other hammer toe(s) (acquired), right foot: Secondary | ICD-10-CM | POA: Diagnosis not present

## 2019-08-24 DIAGNOSIS — M79674 Pain in right toe(s): Secondary | ICD-10-CM | POA: Diagnosis not present

## 2019-08-24 DIAGNOSIS — L608 Other nail disorders: Secondary | ICD-10-CM | POA: Diagnosis not present

## 2019-08-24 DIAGNOSIS — B351 Tinea unguium: Secondary | ICD-10-CM | POA: Diagnosis not present

## 2019-08-27 DIAGNOSIS — M6281 Muscle weakness (generalized): Secondary | ICD-10-CM | POA: Diagnosis not present

## 2019-08-27 DIAGNOSIS — R296 Repeated falls: Secondary | ICD-10-CM | POA: Diagnosis not present

## 2019-08-27 DIAGNOSIS — R2689 Other abnormalities of gait and mobility: Secondary | ICD-10-CM | POA: Diagnosis not present

## 2019-08-29 DIAGNOSIS — G8929 Other chronic pain: Secondary | ICD-10-CM | POA: Diagnosis not present

## 2019-08-29 DIAGNOSIS — R2689 Other abnormalities of gait and mobility: Secondary | ICD-10-CM | POA: Diagnosis not present

## 2019-08-29 DIAGNOSIS — R296 Repeated falls: Secondary | ICD-10-CM | POA: Diagnosis not present

## 2019-08-29 DIAGNOSIS — M6281 Muscle weakness (generalized): Secondary | ICD-10-CM | POA: Diagnosis not present

## 2019-08-30 DIAGNOSIS — M545 Low back pain: Secondary | ICD-10-CM | POA: Diagnosis not present

## 2019-08-30 DIAGNOSIS — M25551 Pain in right hip: Secondary | ICD-10-CM | POA: Diagnosis not present

## 2019-08-30 DIAGNOSIS — M533 Sacrococcygeal disorders, not elsewhere classified: Secondary | ICD-10-CM | POA: Diagnosis not present

## 2019-08-30 DIAGNOSIS — M25552 Pain in left hip: Secondary | ICD-10-CM | POA: Diagnosis not present

## 2019-09-01 DIAGNOSIS — R2689 Other abnormalities of gait and mobility: Secondary | ICD-10-CM | POA: Diagnosis not present

## 2019-09-01 DIAGNOSIS — R296 Repeated falls: Secondary | ICD-10-CM | POA: Diagnosis not present

## 2019-09-01 DIAGNOSIS — M6281 Muscle weakness (generalized): Secondary | ICD-10-CM | POA: Diagnosis not present

## 2019-09-03 DIAGNOSIS — R296 Repeated falls: Secondary | ICD-10-CM | POA: Diagnosis not present

## 2019-09-03 DIAGNOSIS — M6281 Muscle weakness (generalized): Secondary | ICD-10-CM | POA: Diagnosis not present

## 2019-09-03 DIAGNOSIS — R2689 Other abnormalities of gait and mobility: Secondary | ICD-10-CM | POA: Diagnosis not present

## 2019-09-04 DIAGNOSIS — Z8546 Personal history of malignant neoplasm of prostate: Secondary | ICD-10-CM | POA: Diagnosis not present

## 2019-09-04 DIAGNOSIS — R310 Gross hematuria: Secondary | ICD-10-CM | POA: Diagnosis not present

## 2019-09-05 DIAGNOSIS — G8929 Other chronic pain: Secondary | ICD-10-CM | POA: Diagnosis not present

## 2019-09-10 DIAGNOSIS — R296 Repeated falls: Secondary | ICD-10-CM | POA: Diagnosis not present

## 2019-09-10 DIAGNOSIS — R2689 Other abnormalities of gait and mobility: Secondary | ICD-10-CM | POA: Diagnosis not present

## 2019-09-10 DIAGNOSIS — M6281 Muscle weakness (generalized): Secondary | ICD-10-CM | POA: Diagnosis not present

## 2019-09-13 DIAGNOSIS — R2689 Other abnormalities of gait and mobility: Secondary | ICD-10-CM | POA: Diagnosis not present

## 2019-09-13 DIAGNOSIS — R296 Repeated falls: Secondary | ICD-10-CM | POA: Diagnosis not present

## 2019-09-13 DIAGNOSIS — M6281 Muscle weakness (generalized): Secondary | ICD-10-CM | POA: Diagnosis not present

## 2019-09-17 DIAGNOSIS — R2689 Other abnormalities of gait and mobility: Secondary | ICD-10-CM | POA: Diagnosis not present

## 2019-09-17 DIAGNOSIS — R296 Repeated falls: Secondary | ICD-10-CM | POA: Diagnosis not present

## 2019-09-17 DIAGNOSIS — M6281 Muscle weakness (generalized): Secondary | ICD-10-CM | POA: Diagnosis not present

## 2019-09-18 DIAGNOSIS — R2681 Unsteadiness on feet: Secondary | ICD-10-CM | POA: Diagnosis not present

## 2019-09-18 DIAGNOSIS — M6281 Muscle weakness (generalized): Secondary | ICD-10-CM | POA: Diagnosis not present

## 2019-09-18 DIAGNOSIS — R278 Other lack of coordination: Secondary | ICD-10-CM | POA: Diagnosis not present

## 2019-09-18 DIAGNOSIS — R2689 Other abnormalities of gait and mobility: Secondary | ICD-10-CM | POA: Diagnosis not present

## 2019-09-19 DIAGNOSIS — R296 Repeated falls: Secondary | ICD-10-CM | POA: Diagnosis not present

## 2019-09-19 DIAGNOSIS — R2689 Other abnormalities of gait and mobility: Secondary | ICD-10-CM | POA: Diagnosis not present

## 2019-09-19 DIAGNOSIS — M6281 Muscle weakness (generalized): Secondary | ICD-10-CM | POA: Diagnosis not present

## 2019-09-24 DIAGNOSIS — M6281 Muscle weakness (generalized): Secondary | ICD-10-CM | POA: Diagnosis not present

## 2019-09-24 DIAGNOSIS — R296 Repeated falls: Secondary | ICD-10-CM | POA: Diagnosis not present

## 2019-09-24 DIAGNOSIS — R2689 Other abnormalities of gait and mobility: Secondary | ICD-10-CM | POA: Diagnosis not present

## 2019-09-26 DIAGNOSIS — R2689 Other abnormalities of gait and mobility: Secondary | ICD-10-CM | POA: Diagnosis not present

## 2019-09-26 DIAGNOSIS — R296 Repeated falls: Secondary | ICD-10-CM | POA: Diagnosis not present

## 2019-09-26 DIAGNOSIS — M6281 Muscle weakness (generalized): Secondary | ICD-10-CM | POA: Diagnosis not present

## 2019-10-01 DIAGNOSIS — S72144D Nondisplaced intertrochanteric fracture of right femur, subsequent encounter for closed fracture with routine healing: Secondary | ICD-10-CM | POA: Diagnosis not present

## 2019-10-03 DIAGNOSIS — M6281 Muscle weakness (generalized): Secondary | ICD-10-CM | POA: Diagnosis not present

## 2019-10-03 DIAGNOSIS — R2689 Other abnormalities of gait and mobility: Secondary | ICD-10-CM | POA: Diagnosis not present

## 2019-10-03 DIAGNOSIS — R296 Repeated falls: Secondary | ICD-10-CM | POA: Diagnosis not present

## 2019-10-07 DIAGNOSIS — R2689 Other abnormalities of gait and mobility: Secondary | ICD-10-CM | POA: Diagnosis not present

## 2019-10-07 DIAGNOSIS — R296 Repeated falls: Secondary | ICD-10-CM | POA: Diagnosis not present

## 2019-10-07 DIAGNOSIS — M6281 Muscle weakness (generalized): Secondary | ICD-10-CM | POA: Diagnosis not present

## 2019-10-14 DIAGNOSIS — M6281 Muscle weakness (generalized): Secondary | ICD-10-CM | POA: Diagnosis not present

## 2019-10-14 DIAGNOSIS — R2689 Other abnormalities of gait and mobility: Secondary | ICD-10-CM | POA: Diagnosis not present

## 2019-10-14 DIAGNOSIS — R296 Repeated falls: Secondary | ICD-10-CM | POA: Diagnosis not present

## 2019-10-15 ENCOUNTER — Other Ambulatory Visit: Payer: Self-pay

## 2019-10-15 DIAGNOSIS — H16143 Punctate keratitis, bilateral: Secondary | ICD-10-CM | POA: Diagnosis not present

## 2019-10-15 DIAGNOSIS — I724 Aneurysm of artery of lower extremity: Secondary | ICD-10-CM

## 2019-10-15 DIAGNOSIS — H16293 Other keratoconjunctivitis, bilateral: Secondary | ICD-10-CM | POA: Diagnosis not present

## 2019-10-16 DIAGNOSIS — R2689 Other abnormalities of gait and mobility: Secondary | ICD-10-CM | POA: Diagnosis not present

## 2019-10-16 DIAGNOSIS — R278 Other lack of coordination: Secondary | ICD-10-CM | POA: Diagnosis not present

## 2019-10-16 DIAGNOSIS — M6281 Muscle weakness (generalized): Secondary | ICD-10-CM | POA: Diagnosis not present

## 2019-10-16 DIAGNOSIS — R2681 Unsteadiness on feet: Secondary | ICD-10-CM | POA: Diagnosis not present

## 2019-10-23 DIAGNOSIS — R296 Repeated falls: Secondary | ICD-10-CM | POA: Diagnosis not present

## 2019-10-23 DIAGNOSIS — R2689 Other abnormalities of gait and mobility: Secondary | ICD-10-CM | POA: Diagnosis not present

## 2019-10-23 DIAGNOSIS — M6281 Muscle weakness (generalized): Secondary | ICD-10-CM | POA: Diagnosis not present

## 2019-10-28 ENCOUNTER — Other Ambulatory Visit: Payer: Medicare Other

## 2019-10-29 ENCOUNTER — Ambulatory Visit: Payer: Medicare Other | Admitting: Vascular Surgery

## 2019-11-14 ENCOUNTER — Ambulatory Visit
Admission: RE | Admit: 2019-11-14 | Discharge: 2019-11-14 | Disposition: A | Discharge status: Home / Self Care | Payer: Medicare Other | Source: Ambulatory Visit | Attending: Vascular Surgery | Admitting: Vascular Surgery

## 2019-11-14 DIAGNOSIS — I724 Aneurysm of artery of lower extremity: Secondary | ICD-10-CM

## 2019-11-14 MED ORDER — IOPAMIDOL (ISOVUE-370) INJECTION 76%
100.0000 mL | Freq: Once | INTRAVENOUS | Status: AC | PRN
  Administered 2019-11-14: 100 mL via INTRAVENOUS

## 2019-11-15 ENCOUNTER — Other Ambulatory Visit: Payer: Medicare Other

## 2019-11-16 DIAGNOSIS — M6281 Muscle weakness (generalized): Secondary | ICD-10-CM | POA: Diagnosis not present

## 2019-11-16 DIAGNOSIS — R2681 Unsteadiness on feet: Secondary | ICD-10-CM | POA: Diagnosis not present

## 2019-11-16 DIAGNOSIS — R278 Other lack of coordination: Secondary | ICD-10-CM | POA: Diagnosis not present

## 2019-11-16 DIAGNOSIS — R2689 Other abnormalities of gait and mobility: Secondary | ICD-10-CM | POA: Diagnosis not present

## 2019-11-19 ENCOUNTER — Encounter: Payer: Self-pay | Admitting: Vascular Surgery

## 2019-11-19 ENCOUNTER — Ambulatory Visit (INDEPENDENT_AMBULATORY_CARE_PROVIDER_SITE_OTHER): Payer: Medicare Other | Admitting: Vascular Surgery

## 2019-11-19 ENCOUNTER — Other Ambulatory Visit: Payer: Self-pay

## 2019-11-19 VITALS — BP 134/84 | HR 63 | Temp 97.5°F | Resp 20 | Ht 72.0 in | Wt 172.0 lb

## 2019-11-19 DIAGNOSIS — I723 Aneurysm of iliac artery: Secondary | ICD-10-CM | POA: Diagnosis not present

## 2019-11-19 DIAGNOSIS — I724 Aneurysm of artery of lower extremity: Secondary | ICD-10-CM

## 2019-11-19 NOTE — Progress Notes (Signed)
Vascular and Vein Specialist of [facility]  Patient name: Jesse [name] MRN: [id number] DOB: Mar 27, 1928 Sex: male  REASON FOR CONSULT: Follow-up known diffuse arteriomegaly and aneurysms  HPI: Jesse [name] is a 86 y.o. male, who is here today for follow-up.  Since my last visit with him he is suffered a left hip fracture in September and a right hip fracture in November.  He is now in a assisted living.  He remains quite sharp mentally.  He is walking with the assistance of a walking and requires minimal help at his assisted living facility.  He has no claudication symptoms.  He has had no cardiac difficulties.  Past Medical History:  Diagnosis Date  . AAA (abdominal aortic aneurysm) ([place])   . Arthritis   . Cancer ([place]) 2010   prostate ( 40 Txs. of radiation treatments)  . Coronary artery disease   . Environmental allergies   . GERD (gastroesophageal reflux disease)   . History of prostate cancer 2010  . HOH (hard of hearing)    wears bilateral hearing aids  . Hyperlipidemia   . Hypertension   . Kidney stone 1968  . Myocardial infarction ([place]) 1992  . Pneumonia    hx of  . RBBB     Family History  Problem Relation Age of Onset  . Cancer Mother   . Cancer Father   . Varicose Veins Father   . Cancer Sister   . Diabetes Sister   . Heart attack Sister     SOCIAL HISTORY: Social History   Socioeconomic History  . Marital status: Widowed    Spouse name: Not on file  . Number of children: Not on file  . Years of education: Not on file  . Highest education level: Not on file  Occupational History  . Not on file  Tobacco Use  . Smoking status: Former Smoker    Years: 20.00    Types: Cigarettes    Quit date: 07/25/1968    Years since quitting: 51.3  . Smokeless tobacco: Never Used  Substance and Sexual Activity  . Alcohol use: No  . Drug use: No  . Sexual activity: Not on file  Other Topics Concern  . Not on file  Social History  Narrative  . Not on file   Social Determinants of Health   Financial Resource Strain:   . Difficulty of Paying Living Expenses:   Food Insecurity:   . Worried About Charity fundraiser in the Last Year:   . Arboriculturist in the Last Year:   Transportation Needs:   . Film/video editor (Medical):   Marland Kitchen Lack of Transportation (Non-Medical):   Physical Activity:   . Days of Exercise per Week:   . Minutes of Exercise per Session:   Stress:   . Feeling of Stress :   Social Connections:   . Frequency of Communication with Friends and Family:   . Frequency of Social Gatherings with Friends and Family:   . Attends Religious Services:   . Active Member of Clubs or Organizations:   . Attends Archivist Meetings:   Marland Kitchen Marital Status:   Intimate Partner Violence:   . Fear of Current or Ex-Partner:   . Emotionally Abused:   Marland Kitchen Physically Abused:   . Sexually Abused:     Allergies  Allergen Reactions  . Quinolones Other (See Comments)    unknown  . Adhesive [Tape] Itching and Rash    Current  Outpatient Medications  Medication Sig Dispense Refill  . acetaminophen (TYLENOL) 500 MG tablet Take 500 mg by mouth every 6 (six) hours as needed for mild pain or headache.    . alendronate (FOSAMAX) 70 MG tablet Take 70 mg by mouth once a week. Saturday    . aspirin 81 MG tablet Take 1 tablet (81 mg total) by mouth daily. 30 tablet   . carboxymethylcellulose (REFRESH PLUS) 0.5 % SOLN Apply 2 drops to eye at bedtime.     . carvedilol (COREG) 12.5 MG tablet TAKE 1 TABLET BY MOUTH 2 TIMES DAILY WITH A MEAL (Patient taking differently: Take 12.5 mg by mouth 2 (two) times daily with a meal. ) 180 tablet 1  . Cyanocobalamin (B-12 PO) Take 1,000 mcg by mouth daily.     Marland Kitchen docusate sodium (COLACE) 100 MG capsule Take 2 capsules (200 mg total) by mouth 2 (two) times daily. 10 capsule 0  . fenofibrate 160 MG tablet Take 1 tablet (160 mg total) by mouth daily. 90 tablet 1  . ferrous sulfate  325 (65 FE) MG tablet Take 325 mg by mouth daily with breakfast.    . loratadine (CLARITIN) 10 MG tablet Take 10 mg by mouth daily.     . Melatonin 3 MG TABS Take 3 mg by mouth at bedtime as needed (sleep).    . pantoprazole (PROTONIX) 40 MG tablet Take 40 mg by mouth daily.    . simvastatin (ZOCOR) 40 MG tablet Take 1 tablet (40 mg total) by mouth daily at 6 PM. 90 tablet 1  . sodium chloride (OCEAN) 0.65 % SOLN nasal spray Place 2 sprays into both nostrils 2 (two) times daily.     No current facility-administered medications for this visit.    REVIEW OF SYSTEMS:  [X]  denotes positive finding, [ ]  denotes negative finding Cardiac  Comments:  Chest pain or chest pressure:    Shortness of breath upon exertion:    Short of breath when lying flat:    Irregular heart rhythm:        Vascular    Pain in calf, thigh, or hip brought on by ambulation:    Pain in feet at night that wakes you up from your sleep:     Blood clot in your veins:    Leg swelling:         Pulmonary    Oxygen at home:    Productive cough:     Wheezing:         Neurologic    Sudden weakness in arms or legs:     Sudden numbness in arms or legs:     Sudden onset of difficulty speaking or slurred speech:    Temporary loss of vision in one eye:     Problems with dizziness:         Gastrointestinal    Blood in stool:     Vomited blood:         Genitourinary    Burning when urinating:     Blood in urine:        Psychiatric    Major depression:         Hematologic    Bleeding problems:    Problems with blood clotting too easily:        Skin    Rashes or ulcers:        Constitutional    Fever or chills:      PHYSICAL EXAM: Vitals:   11/19/19 1317  BP: 134/84  Pulse: 63  Resp: 20  Temp: (!) 97.5 F (36.4 C)  SpO2: 97%  Weight: 172 lb (78 kg)  Height: 6' (1.829 m)    GENERAL: The patient is a well-nourished male, in no acute distress. The vital signs are documented above. CARDIOVASCULAR: 2+  radial pulses bilaterally.  He has 3+ femoral and easily palpable popliteal artery aneurysm pulses bilaterally.  He has 2+ dorsalis pedis pulses PULMONARY: There is good air exchange  ABDOMEN: Soft and non-tender  MUSCULOSKELETAL: There are no major deformities or cyanosis. NEUROLOGIC: No focal weakness or paresthesias are detected. SKIN: There are no ulcers or rashes noted. PSYCHIATRIC: The patient has a normal affect.  DATA:  He underwent CT follow-up on 11/14/2019.  I reviewed his films and discussed this with him.  This does show his known diffuse arteriomegaly with multiple aneurysms.  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  His right internal iliac artery aneurysm is unchanged and is mostly thrombosed.  There is been a slight increase in size in his left internal iliac artery aneurysm to 5.3 from 4.9 cm.  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    MEDICAL ISSUES: Diffuse arteriomegaly with multiple aneurysms.  We again had a similar discussion as we have had in years past.  He does not have any evidence of rapid growth of his aneurysms.  He certainly would be at extremely high risk for elective repair of these multitude of aneurysms.  I did explain symptoms of popliteal artery occlusion which would be is most highly likely risk.  He knows to present emergently  to the emergency room should this occur.  We will see him again in 1 year with repeat CT scan to rule out rapid expansion  Rosetta Posner, MD [facility] Vascular and Vein Specialists of [facility] Tel [phone] Pager [phone]

## 2019-12-05 ENCOUNTER — Other Ambulatory Visit: Payer: Self-pay

## 2019-12-05 ENCOUNTER — Ambulatory Visit
Admission: RE | Admit: 2019-12-05 | Discharge: 2019-12-05 | Disposition: A | Discharge status: Home / Self Care | Payer: Medicare Other | Source: Ambulatory Visit | Attending: Family Medicine | Admitting: Family Medicine

## 2019-12-05 DIAGNOSIS — M810 Age-related osteoporosis without current pathological fracture: Secondary | ICD-10-CM

## 2019-12-05 DIAGNOSIS — M85832 Other specified disorders of bone density and structure, left forearm: Secondary | ICD-10-CM | POA: Diagnosis not present

## 2019-12-12 ENCOUNTER — Other Ambulatory Visit: Payer: Self-pay | Admitting: Cardiothoracic Surgery

## 2019-12-12 DIAGNOSIS — I712 Thoracic aortic aneurysm, without rupture, unspecified: Secondary | ICD-10-CM

## 2019-12-16 DIAGNOSIS — R278 Other lack of coordination: Secondary | ICD-10-CM | POA: Diagnosis not present

## 2019-12-16 DIAGNOSIS — M6281 Muscle weakness (generalized): Secondary | ICD-10-CM | POA: Diagnosis not present

## 2019-12-16 DIAGNOSIS — R2689 Other abnormalities of gait and mobility: Secondary | ICD-10-CM | POA: Diagnosis not present

## 2019-12-16 DIAGNOSIS — R2681 Unsteadiness on feet: Secondary | ICD-10-CM | POA: Diagnosis not present

## 2019-12-19 DIAGNOSIS — G8929 Other chronic pain: Secondary | ICD-10-CM | POA: Diagnosis not present

## 2020-01-07 DIAGNOSIS — Z8546 Personal history of malignant neoplasm of prostate: Secondary | ICD-10-CM | POA: Diagnosis not present

## 2020-01-07 DIAGNOSIS — R3914 Feeling of incomplete bladder emptying: Secondary | ICD-10-CM | POA: Diagnosis not present

## 2020-01-07 DIAGNOSIS — R310 Gross hematuria: Secondary | ICD-10-CM | POA: Diagnosis not present

## 2020-01-16 ENCOUNTER — Other Ambulatory Visit: Payer: Medicare Other

## 2020-01-16 ENCOUNTER — Ambulatory Visit: Payer: Medicare Other | Admitting: Cardiothoracic Surgery

## 2020-01-16 DIAGNOSIS — M6281 Muscle weakness (generalized): Secondary | ICD-10-CM | POA: Diagnosis not present

## 2020-01-16 DIAGNOSIS — R278 Other lack of coordination: Secondary | ICD-10-CM | POA: Diagnosis not present

## 2020-01-16 DIAGNOSIS — R2689 Other abnormalities of gait and mobility: Secondary | ICD-10-CM | POA: Diagnosis not present

## 2020-01-16 DIAGNOSIS — R2681 Unsteadiness on feet: Secondary | ICD-10-CM | POA: Diagnosis not present

## 2020-01-21 DIAGNOSIS — R310 Gross hematuria: Secondary | ICD-10-CM | POA: Diagnosis not present

## 2020-01-21 DIAGNOSIS — Z8546 Personal history of malignant neoplasm of prostate: Secondary | ICD-10-CM | POA: Diagnosis not present

## 2020-01-21 DIAGNOSIS — R3914 Feeling of incomplete bladder emptying: Secondary | ICD-10-CM | POA: Diagnosis not present

## 2020-01-23 DIAGNOSIS — R0989 Other specified symptoms and signs involving the circulatory and respiratory systems: Secondary | ICD-10-CM | POA: Diagnosis not present

## 2020-01-24 DIAGNOSIS — J90 Pleural effusion, not elsewhere classified: Secondary | ICD-10-CM | POA: Diagnosis not present

## 2020-01-24 DIAGNOSIS — J9811 Atelectasis: Secondary | ICD-10-CM | POA: Diagnosis not present

## 2020-01-29 DIAGNOSIS — R310 Gross hematuria: Secondary | ICD-10-CM | POA: Diagnosis not present

## 2020-01-29 DIAGNOSIS — Z8546 Personal history of malignant neoplasm of prostate: Secondary | ICD-10-CM | POA: Diagnosis not present

## 2020-01-30 DIAGNOSIS — R0989 Other specified symptoms and signs involving the circulatory and respiratory systems: Secondary | ICD-10-CM | POA: Diagnosis not present

## 2020-01-30 DIAGNOSIS — R52 Pain, unspecified: Secondary | ICD-10-CM | POA: Diagnosis not present

## 2020-02-05 DIAGNOSIS — H532 Diplopia: Secondary | ICD-10-CM | POA: Diagnosis not present

## 2020-02-05 DIAGNOSIS — H4922 Sixth [abducent] nerve palsy, left eye: Secondary | ICD-10-CM | POA: Diagnosis not present

## 2020-02-06 DIAGNOSIS — H532 Diplopia: Secondary | ICD-10-CM | POA: Diagnosis not present

## 2020-02-13 DIAGNOSIS — R0989 Other specified symptoms and signs involving the circulatory and respiratory systems: Secondary | ICD-10-CM | POA: Diagnosis not present

## 2020-02-13 DIAGNOSIS — H532 Diplopia: Secondary | ICD-10-CM | POA: Diagnosis not present

## 2020-02-15 DIAGNOSIS — R278 Other lack of coordination: Secondary | ICD-10-CM | POA: Diagnosis not present

## 2020-02-15 DIAGNOSIS — M6281 Muscle weakness (generalized): Secondary | ICD-10-CM | POA: Diagnosis not present

## 2020-02-15 DIAGNOSIS — R2689 Other abnormalities of gait and mobility: Secondary | ICD-10-CM | POA: Diagnosis not present

## 2020-02-15 DIAGNOSIS — R2681 Unsteadiness on feet: Secondary | ICD-10-CM | POA: Diagnosis not present

## 2020-02-20 ENCOUNTER — Telehealth: Payer: Self-pay | Admitting: Neurology

## 2020-02-20 ENCOUNTER — Ambulatory Visit: Payer: Medicare Other | Admitting: Neurology

## 2020-02-20 ENCOUNTER — Encounter: Payer: Self-pay | Admitting: Neurology

## 2020-02-20 VITALS — BP 151/85 | HR 65 | Ht 72.0 in | Wt 176.0 lb

## 2020-02-20 DIAGNOSIS — H532 Diplopia: Secondary | ICD-10-CM

## 2020-02-20 DIAGNOSIS — H4922 Sixth [abducent] nerve palsy, left eye: Secondary | ICD-10-CM | POA: Insufficient documentation

## 2020-02-20 NOTE — Telephone Encounter (Signed)
Pt needs 2 week f/u with Dr. Brett Fairy but there are no openings until 09/07. Please call Pricilla Riffle at [facility] [phone] to schedule when an opening becomes available.

## 2020-02-20 NOTE — Telephone Encounter (Signed)
[employer] medicare order sent to [facility]. No auth they will reach out to the patient to schedule.  

## 2020-02-20 NOTE — Patient Instructions (Signed)
Diplopia Diplopia is a condition in which a person sees two of a single object. It is also called double vision. There are two types of diplopia.  Monocular diplopia. This is double vision that affects only one eye. Monocular diplopia is often caused by a clouding of the lens in your eye (cataract) or by a problem in the way your eye focuses light.  Binocular diplopia. This is double vision that affects both eyes. However, when you shut one eye, the double vision will go away. Binocular diplopia may be more serious. It can be caused by: ? Problems with the nerves or muscles that are responsible for eye movement. ? Disease of the nerves (neurologic disease). ? Immune system conditions, such as Graves' disease. ? Migraine headaches. ? Tumors. ? An infection. ? A stroke. ? An injury. There are many causes of diplopia. Some are not dangerous and can be easily corrected. Diplopia may also be a symptom of a serious medical problem. You may need to see a health care provider who specializes in eye conditions (ophthalmologist) or a nerve specialist (neurologist) to find the cause. Follow these instructions at home:   Pay attention to any changes in your vision. Tell your health care provider about them.  Do not drive or operate heavy machinery if diplopia interferes with your vision.  Keep all follow-up visits as told by your health care provider. This is important. Contact a health care provider if:  Your diplopia gets worse.  You develop any other symptoms along with your diplopia, such as: ? Weakness. ? Numbness. ? Headache. ? Eye pain. ? Clumsiness. ? Nausea. ? Drooping eyelids. ? Abnormal movement of one eye. Get help right away if you:  Have sudden vision loss.  Suddenly get a very bad headache.  Have sudden weakness or numbness.  Suddenly lose the ability to speak, understand speech, or both. These symptoms may represent a serious problem that is an emergency. Do not wait  to see if the symptoms will go away. Get medical help right away. Call your local emergency services (911 in the [place] Do not drive yourself to the hospital. Summary  Diplopia is a condition in which a person sees two of a single object. It is also called double vision.  Monocular diplopia is double vision that affects only one eye. It is often caused by a clouding of the lens in your eye (cataract) or by a problem in the way your eye focuses light.  Binocular diplopia is double vision that affects both eyes. However, when you shut one eye, the double vision will go away. Binocular diplopia may be more serious.  If you have diplopia, you may need to see a health care provider who specializes in eye conditions (ophthalmologist) or a nerve specialist (neurologist) to find the cause. This information is not intended to replace advice given to you by your health care provider. Make sure you discuss any questions you have with your health care provider. Document Revised: 07/05/2017 Document Reviewed: 07/05/2017 Elsevier Patient Education  2020 Elsevier Inc.  

## 2020-02-20 NOTE — Progress Notes (Signed)
Provider:  Larey Seat, M D  Referring Provider: [name], MD Primary Care Physician:  [name], MD  Chief Complaint  Patient presents with  . New Patient (Initial Visit)    pt will CMA, rm 11. pt comes from [place] at [place]. he had a local anesethetic for a cystoscopy around the same time he started to see double vision. he can close one eye and see normally, when he has both eyes opens he sees double.     HPI:  Jesse [name] is a 86 y.o. male patient and resident at [facility], he seen here upon a referral/ revisit  from Dr. Jilda Roche, OD.  He present with a left eye cover- he has had a history of detached retina 20 years ago, following a cataract surgery. He has been able to see well in both eyes until- [facility] July. He woke up with diplopia. He has also had a history prostate cancer. He is on ASA 81 mg per cardiology. He has had some urinary blood clots and was scoped. He had been treated with ATB for UTI.  He broke a leg in a fall- and had pneumonia , tested positive for Covid.      Review of Systems: Out of a complete 14 system review, the patient complains of only the following symptoms, and all other reviewed systems are negative.   Horizontal diplopia.   Social History   Socioeconomic History  . Marital status: Widowed    Spouse name: Not on file  . Number of children: Not on file  . Years of education: Not on file  . Highest education level: Not on file  Occupational History  . Not on file  Tobacco Use  . Smoking status: Former Smoker    Years: 20.00    Types: Cigarettes    Quit date: 07/25/1968    Years since quitting: 51.6  . Smokeless tobacco: Never Used  Vaping Use  . Vaping Use: Never used  Substance and Sexual Activity  . Alcohol use: No  . Drug use: No  . Sexual activity: Not on file  Other Topics Concern  . Not on file  Social History Narrative  . Not on file   Social Determinants of Health   Financial Resource  Strain:   . Difficulty of Paying Living Expenses:   Food Insecurity:   . Worried About Charity fundraiser in the Last Year:   . Arboriculturist in the Last Year:   Transportation Needs:   . Film/video editor (Medical):   Marland Kitchen Lack of Transportation (Non-Medical):   Physical Activity:   . Days of Exercise per Week:   . Minutes of Exercise per Session:   Stress:   . Feeling of Stress :   Social Connections:   . Frequency of Communication with Friends and Family:   . Frequency of Social Gatherings with Friends and Family:   . Attends Religious Services:   . Active Member of Clubs or Organizations:   . Attends Archivist Meetings:   Marland Kitchen Marital Status:   Intimate Partner Violence:   . Fear of Current or Ex-Partner:   . Emotionally Abused:   Marland Kitchen Physically Abused:   . Sexually Abused:     Family History  Problem Relation Age of Onset  . Cancer Mother   . Cancer Father   . Varicose Veins Father   . Cancer Sister   . Diabetes Sister   .  Heart attack Sister     Past Medical History:  Diagnosis Date  . AAA (abdominal aortic aneurysm) ([place])   . Arthritis   . Cancer ([place]) 2010   prostate ( 40 Txs. of radiation treatments)  . Coronary artery disease   . Environmental allergies   . GERD (gastroesophageal reflux disease)   . History of prostate cancer 2010  . HOH (hard of hearing)    wears bilateral hearing aids  . Hyperlipidemia   . Hypertension   . Kidney stone 1968  . Myocardial infarction ([place]) 1992  . Pneumonia    hx of  . RBBB     Past Surgical History:  Procedure Laterality Date  . ABDOMINAL AORTIC ANEURYSM REPAIR  08-13-1993   Dr Cameron Sprang  . CARDIAC CATHETERIZATION    . COLONOSCOPY W/ POLYPECTOMY    . CORONARY ARTERY BYPASS GRAFT  03-01-1991   Dr. Servando Snare  . ESOPHAGOGASTRODUODENOSCOPY (EGD) WITH PROPOFOL N/A 05/04/2018   Procedure: ESOPHAGOGASTRODUODENOSCOPY (EGD) WITH PROPOFOL;  Surgeon: [name], MD;  Location: [facility] ENDOSCOPY;  Service:  Gastroenterology;  Laterality: N/A;  . EYE SURGERY     Detached retina (Left eye); bilateral cataract removal  . FALSE ANEURYSM REPAIR  05/07/2012   Procedure: REPAIR FALSE ANEURYSM;  Surgeon: Rosetta Posner, MD;  Location: [facility] OR;  Service: Vascular;  Laterality: Left;  Repair of Left Femoral Artery Aneurysm  . HERNIA REPAIR    . HOT HEMOSTASIS N/A 05/04/2018   Procedure: HOT HEMOSTASIS (ARGON PLASMA COAGULATION/BICAP);  Surgeon: [name], MD;  Location: [facility];  Service: Gastroenterology;  Laterality: N/A;  . INTRAMEDULLARY (IM) NAIL INTERTROCHANTERIC Left 04/16/2019   Procedure: INTRAMEDULLARY (IM) NAIL INTERTROCHANTRIC;  Surgeon: Rod Can, MD;  Location: [facility] ORS;  Service: Orthopedics;  Laterality: Left;  . INTRAMEDULLARY (IM) NAIL INTERTROCHANTERIC Right 06/17/2019   Procedure: RIGHT INTRAMEDULLARY (IM) NAIL INTERTROCHANTRIC;  Surgeon: Rod Can, MD;  Location: [facility] ORS;  Service: Orthopedics;  Laterality: Right;  . ORIF PATELLA Right 06/09/2015   Procedure: OPEN REDUCTION INTERNAL (ORIF) FIXATION PATELLA;  Surgeon: Marybelle Killings, MD;  Location: [facility];  Service: Orthopedics;  Laterality: Right;  . repair of bowel obstruction  1999   Dr. Dalbert Batman  . repair of ventral hernia  04-20-1994   P. Cameron Sprang MD  . resection femoral artery  03/19/2012   Dr. Curt Jews  . SUBMUCOSAL INJECTION  05/04/2018   Procedure: SUBMUCOSAL INJECTION;  Surgeon: [name], MD;  Location: [facility];  Service: Gastroenterology;;  . Jani Files RESECTION OF BLADDER TUMOR N/A 08/18/2019   Procedure: cysto fulgeration clot evacuation;  Surgeon: Cleon Gustin, MD;  Location: [facility] ORS;  Service: Urology;  Laterality: N/A;    Current Outpatient Medications  Medication Sig Dispense Refill  . acetaminophen (TYLENOL) 500 MG tablet Take 500 mg by mouth every 6 (six) hours as needed for mild pain or headache.    . alendronate (FOSAMAX) 70 MG tablet Take 70 mg by mouth once a week. Saturday      . aspirin 81 MG tablet Take 1 tablet (81 mg total) by mouth daily. 30 tablet   . carboxymethylcellulose (REFRESH PLUS) 0.5 % SOLN Apply 2 drops to eye at bedtime.     . carvedilol (COREG) 12.5 MG tablet TAKE 1 TABLET BY MOUTH 2 TIMES DAILY WITH A MEAL (Patient taking differently: Take 12.5 mg by mouth 2 (two) times daily with a meal. ) 180 tablet 1  . Cyanocobalamin (B-12 PO) Take 1,000 mcg by mouth daily.     Marland Kitchen  docusate sodium (COLACE) 100 MG capsule Take 2 capsules (200 mg total) by mouth 2 (two) times daily. 10 capsule 0  . fenofibrate 160 MG tablet Take 1 tablet (160 mg total) by mouth daily. 90 tablet 1  . ferrous sulfate 325 (65 FE) MG tablet Take 325 mg by mouth daily with breakfast.    . finasteride (PROSCAR) 5 MG tablet Take 5 mg by mouth daily.    Marland Kitchen loratadine (CLARITIN) 10 MG tablet Take 10 mg by mouth daily.     . Melatonin 3 MG TABS Take 3 mg by mouth at bedtime as needed (sleep).    . pantoprazole (PROTONIX) 40 MG tablet Take 40 mg by mouth daily.    . simvastatin (ZOCOR) 40 MG tablet Take 1 tablet (40 mg total) by mouth daily at 6 PM. 90 tablet 1  . sodium chloride (OCEAN) 0.65 % SOLN nasal spray Place 2 sprays into both nostrils 2 (two) times daily.    . tamsulosin (FLOMAX) 0.4 MG CAPS capsule Take 0.4 mg by mouth daily.     No current facility-administered medications for this visit.    Allergies as of 02/20/2020 - Review Complete 02/20/2020  Allergen Reaction Noted  . Quinolones Other (See Comments) 01/10/2019  . Adhesive [tape] Itching and Rash 07/17/2013    Vitals: BP (!) 151/85   Pulse 65   Ht 6' (1.829 m)   Wt 176 lb (79.8 kg)   BMI 23.87 kg/m  Last Weight:  Wt Readings from Last 1 Encounters:  02/20/20 176 lb (79.8 kg)   Last Height:   Ht Readings from Last 1 Encounters:  02/20/20 6' (1.829 m)    Physical exam:  General: The patient is awake, alert and appears not in acute distress. The patient is well groomed. Head: Normocephalic, atraumatic. Neck  is supple. Cardiovascular:  Regular rate and rhythm , without  murmurs or carotid bruit, and without distended neck veins. Respiratory: Lungs are clear to auscultation. Skin:  Without evidence of edema, or rash Trunk: BMI is 24. Neurologic exam : The patient is awake and alert, oriented to place and time.  Memory subjective  described as intact. There is a normal attention span & concentration ability. Speech is fluent without  dysarthria, dysphonia or aphasia. Mood and affect are appropriate.  Cranial nerves: Pupils are equal and briskly reactive to light. Funduscopic exam with evidence of retinal scars. The left pupil is disrounded. Extraocular movements in vertical planes were  intact and without nystagmus. There is clear evidence on red lens test of the left eye not being abducted. It stays fixed in midline.    Visual fields by finger perimetry are intact.Hearing impaired.  Facial sensation intact to fine touch. Facial motor strength is symmetric and tongue and uvula move midline.Tongue protrusion into either cheek is normal. Shoulder shrug is normal.   Motor exam: Normal tone ,muscle bulk and symmetric strength in all extremities for age. Sensory:  Fine touch, pinprick and vibration were tested in all extremities. Proprioception was normal. Coordination: Rapid alternating movements in the fingers/hands were normal. Finger-to-nose maneuver  normal without evidence of ataxia, dysmetria or tremor. Gait and station: Patient walks without assistive device - Stance is stable and normal. Deep tendon reflexes: in the upper and lower extremities are symmetric- Babinski maneuver response is downgoing.   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 So we have to distinguish between a abducens paresis due  to a nuclear injury of the cranial nerve versus a myasthenia gravis condition.  For myasthenia gravis I will order the appropriate antibody panel, I will also order an MRI of the brain with special attention to the orbits and brainstem.  I do have to rule out a possible stroke.   Plan:  Treatment plan and additional workup :  I will also involve his MD ophthalmology.    [name] Sakari Raisanen MD 02/20/2020

## 2020-02-25 ENCOUNTER — Telehealth: Payer: Self-pay

## 2020-02-25 NOTE — Telephone Encounter (Signed)
-----   Message from Larey Seat, MD sent at 02/25/2020 10:11 AM EDT ----- Ach receptor binding Ab test was negative.

## 2020-02-25 NOTE — Progress Notes (Signed)
Ach receptor binding Ab test was negative.

## 2020-02-25 NOTE — Telephone Encounter (Signed)
I reached out to the pt's son ( ok per dpr) and advised of results.  He verbalized understanding and will discuss with pt.  Pt will proceed with MRI as scheduled.

## 2020-02-26 ENCOUNTER — Telehealth: Payer: Self-pay | Admitting: Neurology

## 2020-02-26 NOTE — Telephone Encounter (Signed)
Called the patient's son and there was no answer. Left a message for him to call back.   **if son calls back please advise that the work up that was completed to assess for myasthenia gravis has come back negative.

## 2020-02-26 NOTE — Telephone Encounter (Signed)
-----   Message from Larey Seat, MD sent at 02/26/2020  8:43 AM EDT ----- Negative myasthenia panel.

## 2020-02-26 NOTE — Progress Notes (Signed)
Negative myasthenia panel.

## 2020-02-27 DIAGNOSIS — C61 Malignant neoplasm of prostate: Secondary | ICD-10-CM | POA: Diagnosis not present

## 2020-02-27 DIAGNOSIS — G8929 Other chronic pain: Secondary | ICD-10-CM | POA: Diagnosis not present

## 2020-02-27 DIAGNOSIS — R310 Gross hematuria: Secondary | ICD-10-CM | POA: Diagnosis not present

## 2020-02-27 DIAGNOSIS — F418 Other specified anxiety disorders: Secondary | ICD-10-CM | POA: Diagnosis not present

## 2020-02-27 LAB — ACETYLCHOLINE RECEPTOR AB, ALL
AChR Binding Ab, Serum: <0.03 nmol/L (ref 0.00–0.24)
Acetylchol Block Ab: 10 % (ref 0–25)
Acetylcholine Modulat Ab: <12 % (ref 0–20)

## 2020-02-27 NOTE — Telephone Encounter (Signed)
Patient was seen on 02/20/2020. I will keep eye out for opening aug 16 and forward.

## 2020-02-27 NOTE — Progress Notes (Signed)
No evidence of myasthenia- that means searching for Stroke or tumor by MRI.

## 2020-03-02 NOTE — Telephone Encounter (Signed)
Pt's son [name] on [place] called and was informed of the pt's results. Son verbalized understanding.

## 2020-03-05 ENCOUNTER — Ambulatory Visit: Payer: Medicare Other | Admitting: Cardiothoracic Surgery

## 2020-03-05 DIAGNOSIS — F418 Other specified anxiety disorders: Secondary | ICD-10-CM | POA: Diagnosis not present

## 2020-03-07 ENCOUNTER — Other Ambulatory Visit: Payer: Self-pay

## 2020-03-07 ENCOUNTER — Ambulatory Visit
Admission: RE | Admit: 2020-03-07 | Discharge: 2020-03-07 | Disposition: A | Discharge status: Home / Self Care | Payer: Medicare Other | Source: Ambulatory Visit | Attending: Neurology | Admitting: Neurology

## 2020-03-07 DIAGNOSIS — H532 Diplopia: Secondary | ICD-10-CM

## 2020-03-07 DIAGNOSIS — H4922 Sixth [abducent] nerve palsy, left eye: Secondary | ICD-10-CM

## 2020-03-07 MED ORDER — GADOBENATE DIMEGLUMINE 529 MG/ML IV SOLN
15.0000 mL | Freq: Once | INTRAVENOUS | Status: AC | PRN
  Administered 2020-03-07: 15 mL via INTRAVENOUS

## 2020-03-09 NOTE — Progress Notes (Signed)
IMPRESSION : Abnormal MRI scan of the brain with and without contrast showing moderate to severe changes of chronic small vessel disease and generalized cerebral atrophy which is age-appropriate.  No acute abnormalities noted.  No abnormal enhancement is seen,  No cause for Diplopia is identified.

## 2020-03-17 DIAGNOSIS — R278 Other lack of coordination: Secondary | ICD-10-CM | POA: Diagnosis not present

## 2020-03-17 DIAGNOSIS — R2689 Other abnormalities of gait and mobility: Secondary | ICD-10-CM | POA: Diagnosis not present

## 2020-03-17 DIAGNOSIS — M6281 Muscle weakness (generalized): Secondary | ICD-10-CM | POA: Diagnosis not present

## 2020-03-17 DIAGNOSIS — R2681 Unsteadiness on feet: Secondary | ICD-10-CM | POA: Diagnosis not present

## 2020-03-19 ENCOUNTER — Encounter: Payer: Self-pay | Admitting: Neurology

## 2020-03-19 ENCOUNTER — Ambulatory Visit: Payer: Medicare Other | Admitting: Neurology

## 2020-03-19 VITALS — BP 121/73 | HR 62 | Ht 72.0 in | Wt 177.0 lb

## 2020-03-19 DIAGNOSIS — H532 Diplopia: Secondary | ICD-10-CM

## 2020-03-19 MED ORDER — PYRIDOSTIGMINE BROMIDE 60 MG PO TABS: 30.0000 mg | ORAL_TABLET | Freq: Every day | ORAL | 0 refills | Status: DC

## 2020-03-19 NOTE — Progress Notes (Signed)
Provider:  Larey Seat, M D  Referring Provider: [name], MD Primary Care Physician:  [name], MD  Chief Complaint  Patient presents with  . Follow-up    pt with care taker, rm 10. here follow up from MRI. states that the double vision has not gotten any better, depth perception still off    HPI:  Jesse [name] is a 86 y.o. male patient and resident at [facility], he seen here upon a referral/ revisit  from Dr. Jilda Roche, OD.  He present with a left eye cover- he has had a history of detached retina 20 years ago, following a cataract surgery. He has been able to see well in both eyes until- [facility] July. He woke up with diplopia. He has also had a history prostate cancer. He is on ASA 81 mg per cardiology. He has had some urinary blood clots and was scoped. He had been treated with ATB for URI and UTI.Marland Kitchen  He broke a leg in a fall- and had pneumonia , tested positive for Covid.    Revisit from 19 March 2020.  I have a great pleasure of meeting today again with Jesse [name] and 86 year old resident at [facility] here in [place] with his [place], the patient still has the same eye-movement abnormalities that be initially had seen when he presented here.  Myasthenia antibody panel returned negative, there seems to be no fatigability to his diplopia, updates and paresis is still noted.  The MRI with and without contrast of the brain shows no stroke tumor or bleed.  My suggestions today to try p.o. Mestinon, generic pyridostigmine and only 30 mg in the morning if the patient does not develop palpitation or diarrhea we should continue this for 5 mornings if there is any effect on his diplopia I will be happy to increase the dose from there otherwise we will discontinue.  I will also involve an ophthalmological surgeon in his treatment      Review of Systems: Out of a complete 14 system review, the patient complains of only the following symptoms, and  all other reviewed systems are negative.   Horizontal diplopia.   Social History   Socioeconomic History  . Marital status: Widowed    Spouse name: Not on file  . Number of children: Not on file  . Years of education: Not on file  . Highest education level: Not on file  Occupational History  . Not on file  Tobacco Use  . Smoking status: Former Smoker    Years: 20.00    Types: Cigarettes    Quit date: 07/25/1968    Years since quitting: 51.6  . Smokeless tobacco: Never Used  Vaping Use  . Vaping Use: Never used  Substance and Sexual Activity  . Alcohol use: No  . Drug use: No  . Sexual activity: Not on file  Other Topics Concern  . Not on file  Social History Narrative  . Not on file   Social Determinants of Health   Financial Resource Strain:   . Difficulty of Paying Living Expenses: Not on file  Food Insecurity:   . Worried About Charity fundraiser in the Last Year: Not on file  . Ran Out of Food in the Last Year: Not on file  Transportation Needs:   . Lack of Transportation (Medical): Not on file  . Lack of Transportation (Non-Medical): Not on file  Physical Activity:   . Days of  Exercise per Week: Not on file  . Minutes of Exercise per Session: Not on file  Stress:   . Feeling of Stress : Not on file  Social Connections:   . Frequency of Communication with Friends and Family: Not on file  . Frequency of Social Gatherings with Friends and Family: Not on file  . Attends Religious Services: Not on file  . Active Member of Clubs or Organizations: Not on file  . Attends Archivist Meetings: Not on file  . Marital Status: Not on file  Intimate Partner Violence:   . Fear of Current or Ex-Partner: Not on file  . Emotionally Abused: Not on file  . Physically Abused: Not on file  . Sexually Abused: Not on file    Family History  Problem Relation Age of Onset  . Cancer Mother   . Cancer Father   . Varicose Veins Father   . Cancer Sister   . Diabetes  Sister   . Heart attack Sister     Past Medical History:  Diagnosis Date  . AAA (abdominal aortic aneurysm) ([place])   . Arthritis   . Cancer ([place]) 2010   prostate ( 40 Txs. of radiation treatments)  . Coronary artery disease   . Environmental allergies   . GERD (gastroesophageal reflux disease)   . History of prostate cancer 2010  . HOH (hard of hearing)    wears bilateral hearing aids  . Hyperlipidemia   . Hypertension   . Kidney stone 1968  . Myocardial infarction ([place]) 1992  . Pneumonia    hx of  . RBBB     Past Surgical History:  Procedure Laterality Date  . ABDOMINAL AORTIC ANEURYSM REPAIR  08-13-1993   Dr Cameron Sprang  . CARDIAC CATHETERIZATION    . COLONOSCOPY W/ POLYPECTOMY    . CORONARY ARTERY BYPASS GRAFT  03-01-1991   Dr. Servando Snare  . ESOPHAGOGASTRODUODENOSCOPY (EGD) WITH PROPOFOL N/A 05/04/2018   Procedure: ESOPHAGOGASTRODUODENOSCOPY (EGD) WITH PROPOFOL;  Surgeon: [name], MD;  Location: [facility];  Service: Gastroenterology;  Laterality: N/A;  . EYE SURGERY     Detached retina (Left eye); bilateral cataract removal  . FALSE ANEURYSM REPAIR  05/07/2012   Procedure: REPAIR FALSE ANEURYSM;  Surgeon: Rosetta Posner, MD;  Location: [facility] OR;  Service: Vascular;  Laterality: Left;  Repair of Left Femoral Artery Aneurysm  . HERNIA REPAIR    . HOT HEMOSTASIS N/A 05/04/2018   Procedure: HOT HEMOSTASIS (ARGON PLASMA COAGULATION/BICAP);  Surgeon: [name], MD;  Location: [facility];  Service: Gastroenterology;  Laterality: N/A;  . INTRAMEDULLARY (IM) NAIL INTERTROCHANTERIC Left 04/16/2019   Procedure: INTRAMEDULLARY (IM) NAIL INTERTROCHANTRIC;  Surgeon: Rod Can, MD;  Location: [facility];  Service: Orthopedics;  Laterality: Left;  . INTRAMEDULLARY (IM) NAIL INTERTROCHANTERIC Right 06/17/2019   Procedure: RIGHT INTRAMEDULLARY (IM) NAIL INTERTROCHANTRIC;  Surgeon: Rod Can, MD;  Location: [facility];  Service: Orthopedics;  Laterality: Right;  . ORIF PATELLA  Right 06/09/2015   Procedure: OPEN REDUCTION INTERNAL (ORIF) FIXATION PATELLA;  Surgeon: Marybelle Killings, MD;  [facility];  Service: Orthopedics;  Laterality: Right;  . repair of bowel obstruction  1999   Dr. Dalbert Batman  . repair of ventral hernia  04-20-1994   P. Cameron Sprang MD  . resection femoral artery  03/19/2012   Dr. Curt Jews  . SUBMUCOSAL INJECTION  05/04/2018   Procedure: SUBMUCOSAL INJECTION;  Surgeon: [name], MD;  Location: [facility];  Service: Gastroenterology;;  . TRANSURETHRAL RESECTION OF  BLADDER TUMOR N/A 08/18/2019   Procedure: cysto fulgeration clot evacuation;  Surgeon: Cleon Gustin, MD;  Location: [facility];  Service: Urology;  Laterality: N/A;    Current Outpatient Medications  Medication Sig Dispense Refill  . acetaminophen (TYLENOL) 500 MG tablet Take 500 mg by mouth every 6 (six) hours as needed for mild pain or headache.    . alendronate (FOSAMAX) 70 MG tablet Take 70 mg by mouth once a week. Saturday    . aspirin 81 MG tablet Take 1 tablet (81 mg total) by mouth daily. 30 tablet   . carboxymethylcellulose (REFRESH PLUS) 0.5 % SOLN Apply 2 drops to eye at bedtime.     . carvedilol (COREG) 12.5 MG tablet TAKE 1 TABLET BY MOUTH 2 TIMES DAILY WITH A MEAL (Patient taking differently: Take 12.5 mg by mouth 2 (two) times daily with a meal. ) 180 tablet 1  . Cyanocobalamin (B-12 PO) Take 1,000 mcg by mouth daily.     Marland Kitchen docusate sodium (COLACE) 100 MG capsule Take 2 capsules (200 mg total) by mouth 2 (two) times daily. 10 capsule 0  . fenofibrate 160 MG tablet Take 1 tablet (160 mg total) by mouth daily. 90 tablet 1  . ferrous sulfate 325 (65 FE) MG tablet Take 325 mg by mouth daily with breakfast.    . finasteride (PROSCAR) 5 MG tablet Take 5 mg by mouth daily.    Marland Kitchen loratadine (CLARITIN) 10 MG tablet Take 10 mg by mouth daily.     . Melatonin 3 MG TABS Take 3 mg by mouth at bedtime as needed (sleep).    . pantoprazole (PROTONIX) 40 MG tablet Take 40 mg by  mouth daily.    . simvastatin (ZOCOR) 40 MG tablet Take 1 tablet (40 mg total) by mouth daily at 6 PM. 90 tablet 1  . sodium chloride (OCEAN) 0.65 % SOLN nasal spray Place 2 sprays into both nostrils 2 (two) times daily.    . tamsulosin (FLOMAX) 0.4 MG CAPS capsule Take 0.4 mg by mouth daily.    . sertraline (ZOLOFT) 25 MG tablet Take 25 mg by mouth daily.     No current facility-administered medications for this visit.    Allergies as of 03/19/2020 - Review Complete 03/19/2020  Allergen Reaction Noted  . Quinolones Other (See Comments) 01/10/2019  . Adhesive [tape] Itching and Rash 07/17/2013    Vitals: BP 121/73   Pulse 62   Ht 6' (1.829 m)   Wt 177 lb (80.3 kg)   BMI 24.01 kg/m  Last Weight:  Wt Readings from Last 1 Encounters:  03/19/20 177 lb (80.3 kg)   Last Height:   Ht Readings from Last 1 Encounters:  03/19/20 6' (1.829 m)    Physical exam:  General: The patient is awake, alert and appears not in acute distress. The patient is well groomed. Head: Normocephalic, atraumatic. Neck is supple. Cardiovascular:  Regular rate and rhythm , without  murmurs or carotid bruit, and without distended neck veins. Respiratory: Lungs are clear to auscultation. Skin:  Without evidence of edema, or rash Trunk: BMI is 24. Neurologic exam : The patient is awake and alert, oriented to place and time.  Memory subjective  described as intact. There is a normal attention span & concentration ability. Speech is fluent without  dysarthria, dysphonia or aphasia. Mood and affect are appropriate.  Cranial nerves: Pupils are equal and briskly reactive to light. Funduscopic exam with evidence of retinal scars.  The left pupil is  disrounded. Extraocular movements in vertical planes were  intact and without nystagmus. There is clear evidence on red lens test of the left eye not being abducted. It does not cross the midline.      Visual fields by finger perimetry are intact.Hearing impaired.   Facial sensation intact to fine touch. Facial motor strength is symmetric and tongue and uvula move midline.Tongue protrusion into either cheek is normal. Shoulder shrug is normal.   Motor exam: Normal tone ,muscle bulk and symmetric strength in all extremities for age. Sensory:  Fine touch, pinprick and vibration were tested in all extremities. Proprioception was normal. Coordination: Rapid alternating movements in the fingers/hands were normal. Finger-to-nose maneuver  normal without evidence of ataxia, dysmetria or tremor. Gait and station: Patient walks without assistive device - Stance is stable and normal. Deep tendon reflexes: in the upper and lower extremities are symmetric- Babinski maneuver response is downgoing.   Assessment:   18 minute RV After physical and neurologic examination, review of laboratory studies, imaging, neurophysiology testing and pre-existing records, assessment is that of :  1) local anaesthetic may have provoked diplopia with abducens paresis. He had  Previous eye surgeries , detached retina at the left eye.  An abducens paresis of unknown origin.       Plan:  Treatment plan and additional workup :  I would like for him to be observed so that he can see if his eye movement abnormality corrects itself also because palpitations or diarrhea may affect him as a side effect the patient had coronary artery disease and had several bypass surgeries so it is important to make sure that he does not get a ventricular tachycardia.  In addition I will send him to a ophthalmology MD with an evaluation as well.  Our MRI has not shown evidence of any stroke tumor or bleed and it was performed with and without contrast.  There has been no change in the brainstem or cortex.  At the 2 choline binding blocking and modulating antibodies were all negative but this can happen in ocular myasthenia.  However the patient has the same eye deviation all day and night it is not depending on  degree of fatigue and is not better in the morning and gets worse at night.  So a Mestinon trial is a long shot.  I hope that an ophthalmology surgeon can help him to correct the abducens palsy.  Trial of mestinon, just 5 tabs one each morning- has to be supervised.  discontinue if side effect or no effect. You don't need to ask for my permission. Just inform me!  [place]   Larey Seat MD 03/19/2020

## 2020-03-19 NOTE — Patient Instructions (Signed)
Diplopia Diplopia is a condition in which a person sees two of a single object. It is also called double vision. There are two types of diplopia.  Monocular diplopia. This is double vision that affects only one eye. Monocular diplopia is often caused by a clouding of the lens in your eye (cataract) or by a problem in the way your eye focuses light.  Binocular diplopia. This is double vision that affects both eyes. However, when you shut one eye, the double vision will go away. Binocular diplopia may be more serious. It can be caused by: ? Problems with the nerves or muscles that are responsible for eye movement. ? Disease of the nerves (neurologic disease). ? Immune system conditions, such as Graves' disease. ? Migraine headaches. ? Tumors. ? An infection. ? A stroke. ? An injury. There are many causes of diplopia. Some are not dangerous and can be easily corrected. Diplopia may also be a symptom of a serious medical problem. You may need to see a health care provider who specializes in eye conditions (ophthalmologist) or a nerve specialist (neurologist) to find the cause. Follow these instructions at home:   Pay attention to any changes in your vision. Tell your health care provider about them.  Do not drive or operate heavy machinery if diplopia interferes with your vision.  Keep all follow-up visits as told by your health care provider. This is important. Contact a health care provider if:  Your diplopia gets worse.  You develop any other symptoms along with your diplopia, such as: ? Weakness. ? Numbness. ? Headache. ? Eye pain. ? Clumsiness. ? Nausea. ? Drooping eyelids. ? Abnormal movement of one eye. Get help right away if you:  Have sudden vision loss.  Suddenly get a very bad headache.  Have sudden weakness or numbness.  Suddenly lose the ability to speak, understand speech, or both. These symptoms may represent a serious problem that is an emergency. Do not wait  to see if the symptoms will go away. Get medical help right away. Call your local emergency services (911 in the [place] Do not drive yourself to the hospital. Summary  Diplopia is a condition in which a person sees two of a single object. It is also called double vision.  Monocular diplopia is double vision that affects only one eye. It is often caused by a clouding of the lens in your eye (cataract) or by a problem in the way your eye focuses light.  Binocular diplopia is double vision that affects both eyes. However, when you shut one eye, the double vision will go away. Binocular diplopia may be more serious.  If you have diplopia, you may need to see a health care provider who specializes in eye conditions (ophthalmologist) or a nerve specialist (neurologist) to find the cause. This information is not intended to replace advice given to you by your health care provider. Make sure you discuss any questions you have with your health care provider. Document Revised: 07/05/2017 Document Reviewed: 07/05/2017 Elsevier Patient Education  2020 Elsevier Inc.  

## 2020-03-25 NOTE — Progress Notes (Deleted)
[address]       [place] [address]             [phone]                    Juanangel W Schipani [facility]isago Medical Record #[id number] Date of Birth: 1928/04/03  Referring: Early, [name], MD Primary Care: [name], MD Primary cardiology: [name], MD  Chief Complaint:    No chief complaint on file.   History of Present Illness:     Breon W Spieler 86 y.o. male is seen in the office for follow-up of of  thoracic aneurysm. He has been followed by Dr Early  for abdominal aneurysm disease known since 1992 when he had CABG by me.   He had open abdominal aneurysm repair and bilateral femoral  aneurysm repair  In 1995       In the fall 2019 the patient developed a 5 unit GI bleed, transiently he was off aspirin but currently on 81 mg a day Since last seen the patient has no new complaints.  He returns today with a follow-up CTA of the chest.   Current Activity/ Functional Status:  Patient is independent with mobility/ambulation, transfers, ADL's, IADL's.   Zubrod Score: At the time of surgery this patient's most appropriate activity status/level should be described as: []     0    Normal activity, no symptoms [x]     1    Restricted in physical strenuous activity but ambulatory, able to do out light work []     2    Ambulatory and capable of self care, unable to do work activities, up and about               >50 % of waking hours                              []     3    Only limited self care, in bed greater than 50% of waking hours []     4    Completely disabled, no self care, confined to bed or chair []     5    Moribund   Past Medical History:  Diagnosis Date  . AAA (abdominal aortic aneurysm) ([place])   . Arthritis   . Cancer ([place]) 2010   prostate ( 40 Txs. of radiation treatments)  . Coronary artery disease   . Environmental allergies   . GERD (gastroesophageal reflux disease)   . History of prostate cancer 2010  . HOH (hard of hearing)    wears  bilateral hearing aids  . Hyperlipidemia   . Hypertension   . Kidney stone 1968  . Myocardial infarction ([place]) 1992  . Pneumonia    hx of  . RBBB     Past Surgical History:  Procedure Laterality Date  . ABDOMINAL AORTIC ANEURYSM REPAIR  08-13-1993   Dr Cameron Sprang  . CARDIAC CATHETERIZATION    . COLONOSCOPY W/ POLYPECTOMY    . CORONARY ARTERY BYPASS GRAFT  03-01-1991   Dr. Servando Snare  . ESOPHAGOGASTRODUODENOSCOPY (EGD) WITH PROPOFOL N/A 05/04/2018   Procedure: ESOPHAGOGASTRODUODENOSCOPY (EGD) WITH PROPOFOL;  Surgeon: [name], MD;  Location: [facility];  Service: Gastroenterology;  Laterality: N/A;  . EYE SURGERY     Detached retina (Left eye); bilateral cataract removal  . FALSE ANEURYSM REPAIR  05/07/2012   Procedure: REPAIR FALSE ANEURYSM;  Surgeon:  Rosetta Posner, MD;  Location[facility];  Service: Vascular;  Laterality: Left;  Repair of Left Femoral Artery Aneurysm  . HERNIA REPAIR    . HOT HEMOSTASIS N/A 05/04/2018   Procedure: HOT HEMOSTASIS (ARGON PLASMA COAGULATION/BICAP);  Surgeon: [name], MD;  Location: [facility];  Service: Gastroenterology;  Laterality: N/A;  . INTRAMEDULLARY (IM) NAIL INTERTROCHANTERIC Left 04/16/2019   Procedure: INTRAMEDULLARY (IM) NAIL INTERTROCHANTRIC;  Surgeon: Rod Can, MD;  Location: [facility];  Service: Orthopedics;  Laterality: Left;  . INTRAMEDULLARY (IM) NAIL INTERTROCHANTERIC Right 06/17/2019   Procedure: RIGHT INTRAMEDULLARY (IM) NAIL INTERTROCHANTRIC;  Surgeon: Rod Can, MD;  Location: [facility];  Service: Orthopedics;  Laterality: Right;  . ORIF PATELLA Right 06/09/2015   Procedure: OPEN REDUCTION INTERNAL (ORIF) FIXATION PATELLA;  Surgeon: Marybelle Killings, MD;  Location: [facility];  Service: Orthopedics;  Laterality: Right;  . repair of bowel obstruction  1999   Dr. Dalbert Batman  . repair of ventral hernia  04-20-1994   P. Cameron Sprang MD  . resection femoral artery  03/19/2012   Dr. Curt Jews  . SUBMUCOSAL INJECTION  05/04/2018    Procedure: SUBMUCOSAL INJECTION;  Surgeon: [name], MD;  Location: [facility];  Service: Gastroenterology;;  . Jani Files RESECTION OF BLADDER TUMOR N/A 08/18/2019   Procedure: cysto fulgeration clot evacuation;  Surgeon: Cleon Gustin, MD;  Location: [facility];  Service: Urology;  Laterality: N/A;    Family History  Problem Relation Age of Onset  . Cancer Mother   . Cancer Father   . Varicose Veins Father   . Cancer Sister   . Diabetes Sister   . Heart attack Sister     Social History   Socioeconomic History  . Marital status: Widowed    Spouse name: Not on file  . Number of children: Not on file  . Years of education: Not on file  . Highest education level: Not on file  Occupational History  . Not on file  Tobacco Use  . Smoking status: Former Smoker    Years: 20.00    Types: Cigarettes    Quit date: 07/25/1968    Years since quitting: 51.7  . Smokeless tobacco: Never Used  Vaping Use  . Vaping Use: Never used  Substance and Sexual Activity  . Alcohol use: No  . Drug use: No  . Sexual activity: Not on file  Other Topics Concern  . Not on file  Social History Narrative  . Not on file   Social Determinants of Health   Financial Resource Strain:   . Difficulty of Paying Living Expenses: Not on file  Food Insecurity:   . Worried About Charity fundraiser in the Last Year: Not on file  . Ran Out of Food in the Last Year: Not on file  Transportation Needs:   . Lack of Transportation (Medical): Not on file  . Lack of Transportation (Non-Medical): Not on file  Physical Activity:   . Days of Exercise per Week: Not on file  . Minutes of Exercise per Session: Not on file  Stress:   . Feeling of Stress : Not on file  Social Connections:   . Frequency of Communication with Friends and Family: Not on file  . Frequency of Social Gatherings with Friends and Family: Not on file  . Attends Religious Services: Not on file  . Active Member of Clubs or  Organizations: Not on file  . Attends Archivist Meetings: Not on file  . Marital  Status: Not on file  Intimate Partner Violence:   . Fear of Current or Ex-Partner: Not on file  . Emotionally Abused: Not on file  . Physically Abused: Not on file  . Sexually Abused: Not on file    Social History   Tobacco Use  Smoking Status Former Smoker  . Years: 20.00  . Types: Cigarettes  . Quit date: 07/25/1968  . Years since quitting: 51.7  Smokeless Tobacco Never Used    Social History   Substance and Sexual Activity  Alcohol Use No     Allergies  Allergen Reactions  . Quinolones Other (See Comments)    unknown  . Adhesive [Tape] Itching and Rash    Current Outpatient Medications  Medication Sig Dispense Refill  . acetaminophen (TYLENOL) 500 MG tablet Take 500 mg by mouth every 6 (six) hours as needed for mild pain or headache.    . alendronate (FOSAMAX) 70 MG tablet Take 70 mg by mouth once a week. Saturday    . aspirin 81 MG tablet Take 1 tablet (81 mg total) by mouth daily. 30 tablet   . carboxymethylcellulose (REFRESH PLUS) 0.5 % SOLN Apply 2 drops to eye at bedtime.     . carvedilol (COREG) 12.5 MG tablet TAKE 1 TABLET BY MOUTH 2 TIMES DAILY WITH A MEAL (Patient taking differently: Take 12.5 mg by mouth 2 (two) times daily with a meal. ) 180 tablet 1  . Cyanocobalamin (B-12 PO) Take 1,000 mcg by mouth daily.     Marland Kitchen docusate sodium (COLACE) 100 MG capsule Take 2 capsules (200 mg total) by mouth 2 (two) times daily. 10 capsule 0  . fenofibrate 160 MG tablet Take 1 tablet (160 mg total) by mouth daily. 90 tablet 1  . ferrous sulfate 325 (65 FE) MG tablet Take 325 mg by mouth daily with breakfast.    . finasteride (PROSCAR) 5 MG tablet Take 5 mg by mouth daily.    Marland Kitchen loratadine (CLARITIN) 10 MG tablet Take 10 mg by mouth daily.     . Melatonin 3 MG TABS Take 3 mg by mouth at bedtime as needed (sleep).    . pantoprazole (PROTONIX) 40 MG tablet Take 40 mg by mouth daily.     Marland Kitchen pyridostigmine (MESTINON) 60 MG tablet Take 0.5 tablets (30 mg total) by mouth daily after breakfast. 3 tablet 0  . sertraline (ZOLOFT) 25 MG tablet Take 25 mg by mouth daily.    . simvastatin (ZOCOR) 40 MG tablet Take 1 tablet (40 mg total) by mouth daily at 6 PM. 90 tablet 1  . sodium chloride (OCEAN) 0.65 % SOLN nasal spray Place 2 sprays into both nostrils 2 (two) times daily.    . tamsulosin (FLOMAX) 0.4 MG CAPS capsule Take 0.4 mg by mouth daily.     No current facility-administered medications for this visit.      Review of Systems:     Cardiac Review of Systems: Y or N  Chest Pain [ N ]  Resting SOB [  N] Exertional SOB  [  N]  Orthopnea [ N ]   Pedal Edema Aqua.Slicker   ]    Palpitations Aqua.Slicker  ] Syncope  Aqua.Slicker  ]   Presyncope [  N ]  General Review of Systems: [Y] = yes [  ]=no Constitional: recent weight change [ n ];  Wt loss over the last 3 months [   ] anorexia [  ]; fatigue [  ]; nausea [  ];  night sweats [  ]; fever [  ]; or chills [  ];          Dental: poor dentition[  ]; Last Dentist visit:   Eye : blurred vision [  ]; diplopia [   ]; vision changes [  ];  Amaurosis fugax[  ]; Resp: cough [ ] ;  wheezing[  ];  hemoptysis[  ]; shortness of breath[  ]; paroxysmal nocturnal dyspnea[] ; dyspnea on exertion[  ]; or orthopnea[ n ];  GI:  gallstones[  ], vomiting[  ];  dysphagia[  ]; melena[  ];  hematochezia [  ]; heartburn[  ];   Hx of  Colonoscopy[  ]; GU: kidney stones [  ]; hematuria[  ];   dysuria [  ];  nocturia[  ];  history of     obstruction [  ]; urinary frequency [  ]             Skin: rash, swelling[  ];, hair loss[  ];  peripheral edema[  ];  or itching[  ]; Musculosketetal: myalgias[  ];  joint swelling[  ];  joint erythema[  ];  joint pain[  ];  back pain[  ];  Heme/Lymph: bruising[  ];  bleeding[  ];  anemia[  ];  Neuro: TIA[  ];  headaches[  ];  stroke[  ];  vertigo[  ];  seizures[  n];   paresthesias[  ];  difficulty walking[ n ];  Psych:depression[  ]; anxiety[  ];   Endocrine: diabetes[  ];  thyroid dysfunction[  ];  Immunizations: Flu up to date Blue.Reese ]; Pneumococcal up to date [  ];  Other:  Physical Exam: There were no vitals taken for this visit.  PHYSICAL EXAMINATION: General appearance: alert and appears stated age Head: Normocephalic, without obvious abnormality, atraumatic Neck: no adenopathy, no carotid bruit, no JVD, supple, symmetrical, trachea midline and thyroid not enlarged, symmetric, no tenderness/mass/nodules Lymph nodes: Cervical, supraclavicular, and axillary nodes normal. Resp: clear to auscultation bilaterally Cardio: regular rate and rhythm, S1, S2 normal, no murmur, click, rub or gallop GI: soft, non-tender; bowel sounds normal; no masses,  no organomegaly and Significant abdominal incisional hernia from previous aneurysm repair Extremities: extremities normal, atraumatic, no cyanosis or edema and Homans sign is negative, no sign of DVT Neurologic: Grossly normal Bilateral groin incisions present well-healed Bilateral popliteal artery pulses easily felt DP PT pulses present bilaterally  Diagnostic Studies & Laboratory data:     Recent Radiology Findings:  MR BRAIN W WO CONTRAST  Result Date: 03/08/2020  [facility] [address] [place], [place] [address] [phone] NEUROIMAGING REPORT STUDY DATE:03/07/2020 PATIENT NAME: ALTAN KRAAI DOB: 1928/05/24 MRN: [id number] ORDERING CLINICIAN:  Dr [name] CLINICAL HISTORY: 86 year old patient being evaluated for diplopia COMPARISON FILMS: None available EXAM: MRI brain without and with contrast  TECHNIQUE :MRI of the brain with and without contrast was obtained utilizing 5 mm axial slices with T1, T2, T2 flair, T2 star gradient echo and diffusion weighted views.  T1 sagittal, T2 coronal and postcontrast views in the axial and coronal plane were obtained. STUDY : MRI brain with and without contrast Contrast : 15 mL IV MultiHance Location ;  Imaging  Description : The brain parenchyma shows scattered bilateral periventricular and subcortical white matter hyperintensities compatible with age-related changes of chronic small vessel disease.  There is also moderate degree of generalized cerebral cortical atrophy which is age proportionate.  No other structural lesion, tumor or infarcts are  noted.  Gradient echo sequences do not show significant microhemorrhages.  Orbits appear unremarkable paranasal sinuses show mild chronic inflammatory changes.  Pituitary gland and cerebellar tonsils appear normal.  Calvarium shows no abnormalities.  Extra-axial brain structures appear normal.  Flow-voids of large vessels of intracranial circulation appear to be patent.  Postcontrast images do not result in abnormal areas of enhancement.  Visualized portion of the upper cervical spine and craniovertebral junction appear normal. IMPRESSION : Abnormal MRI scan of the brain with and without contrast showing moderate to severe changes of chronic small vessel disease and generalized cerebral atrophy which is age-appropriate.  No acute abnormalities noted.  No abnormal enhancement is seen  Not mentioned in the report is evidence of dilatation of the vein graft to the right coronary artery appears stable from previous scans, the right vein graft appears to be patent  I have independently reviewed the above radiology studies  and reviewed the findings with the patient.   Ct Angio Chest Aorta W/cm &/or Wo/cm  Result Date: 10/27/2016 CLINICAL DATA:  Thoracic aortic aneurysm without rupture. EXAM: CT ANGIOGRAPHY CHEST WITH CONTRAST TECHNIQUE: Multidetector CT imaging of the chest was performed using the standard protocol during bolus administration of intravenous contrast. Multiplanar CT image reconstructions and MIPs were obtained to evaluate the vascular anatomy. CONTRAST:  75 mL of Isovue 370 intravenously. COMPARISON:  CT scan of May 18, 2016. FINDINGS: Cardiovascular: Stable  4.2 cm ascending thoracic aortic aneurysm is noted. Stable 4.5 cm proximal descending thoracic aortic aneurysm is noted. Aortic root measures 3.9 cm. Atherosclerosis of thoracic aorta is noted. No dissection is noted. Great vessels are widely patent. Status post coronary artery bypass graft. 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 Mediastinum/Nodes: No enlarged mediastinal, hilar, or axillary lymph nodes. Thyroid gland, trachea, and esophagus demonstrate no significant findings. Lungs/Pleura: Lungs are clear. No pleural effusion or pneumothorax. Upper Abdomen: Cholelithiasis is noted. No other significant abnormality is noted in the visualized portion of upper abdomen. Musculoskeletal: No chest wall abnormality. No acute or significant osseous findings. Review of the MIP images confirms the above findings. IMPRESSION: Stable 4.2 cm ascending thoracic aortic aneurysm. Stable 4.5 cm proximal descending thoracic aortic aneurysm. Recommend annual imaging followup by CTA or MRA. This recommendation follows 2010 ACCF/AHA/AATS/ACR/ASA/SCA/SCAI/SIR/STS/SVM Guidelines for the Diagnosis and Management of Patients with Thoracic Aortic Disease. Circulation. 2010; 121: T622-Q333. Lithiasis. Electronically Signed   By: Marijo Conception, M.D.   On: 10/27/2016 13:52   Ct Angio Chest Pe W/cm &/or Wo Cm  01/01/2016  CLINICAL DATA:  86 year old presenting with intermittent shortness of breath for the past several days which acutely worsened this morning. Intermittent cough. Elevated D-dimer. Prior CABG in 1992 and prior abdominal aortic aneurysm repair in 1995. EXAM: CT ANGIOGRAPHY CHEST WITH CONTRAST TECHNIQUE: Multidetector CT imaging of the chest was performed using the standard protocol during bolus administration of intravenous contrast. Multiplanar CT image reconstructions and MIPs were obtained to evaluate the vascular anatomy. CONTRAST:  100 ml Isovue 370 IV. COMPARISON:  No prior CT.  Multiple prior  chest x-rays. FINDINGS: Technical quality:  Very good. Pulmonary embolism:  Absent. Cardiovascular: Prior sternotomy for CABG. Heart moderately to markedly enlarged. Mild left ventricular enlargement and possible mild left ventricular hypertrophy. Right ventricular enlargement and right ventricular hypertrophy. No pericardial effusion. Moderate atherosclerosis involving the thoracic and upper abdominal aorta with evidence of the descending thoracic aortic aneurysm with maximum diameter approximating 4.5 cm. Mediastinum/Lymph Nodes: No pathologically enlarged mediastinal, hilar or axillary  lymph nodes. No mediastinal masses. Normal-appearing esophagus. Visualized thyroid gland unremarkable. Lungs/Pleura: Streaky and patchy opacities in the left lower lobe. Lungs otherwise clear. No evidence of interstitial pulmonary edema. No pulmonary parenchymal nodules or masses. No pleural effusions. Central airways patent with moderate to marked bronchial wall thickening. Upper abdomen: Numerous calcified gallstones without evidence of acute cholecystitis. No acute abnormalities. Musculoskeletal: Severe osseous demineralization. Exaggeration of the usual kyphosis. Mild degenerative disc disease and spondylosis involving the mid and lower thoracic spine. Review of the MIP images confirms the above findings. IMPRESSION: 1. No evidence of pulmonary embolism. 2. Moderate to marked cardiomegaly with biventricular enlarged. 3. Approximate 4.5 cm descending thoracic aortic aneurysm. Recommend annual imaging followup by CTA or MRA. This recommendation follows 2010 ACCF/AHA/AATS/ACR/ASA/SCA/SCAI/SIR/STS/SVM Guidelines for the Diagnosis and Management of Patients with Thoracic Aortic Disease. Circulation. 2010; 121: Q119-E174. 4. Left lower lobe atelectasis and/or bronchopneumonia. Moderate to severe changes of bronchitis and/or asthma. 5. Cholelithiasis without evidence of acute cholecystitis. Electronically Signed   By: Evangeline Dakin  M.D.   On: 01/01/2016 15:10   Dg Abd 2 Views  01/01/2016  CLINICAL DATA:  Abdominal pain for 1 week.  Nausea. EXAM: ABDOMEN - 2 VIEW COMPARISON:  None. FINDINGS: Atelectasis seen in the left lung base. Lung bases otherwise normal. No free air, portal venous gas, or pneumatosis. Surgical clips are seen within the mid central abdomen. Surgical clips are seen in the pelvis is well. There is no evidence of bowel obstruction on this study. Both kidneys are largely obscured by bowel contents but no renal stones are identified. IMPRESSION: No acute abnormalities. Electronically Signed   By: Dorise Bullion III M.D   On: 01/01/2016 12:57     I have independently reviewed the above radiologic studies.  Recent Lab Findings: Lab Results  Component Value Date   WBC 5.8 08/20/2019   HGB 8.9 (L) 08/20/2019   HCT 28.5 (L) 08/20/2019   PLT 216 08/20/2019   GLUCOSE 102 (H) 08/20/2019   CHOL 106 03/02/2017   TRIG 57 03/02/2017   HDL 32 (L) 03/02/2017   LDLCALC 63 03/02/2017   ALT 19 06/24/2019   AST 19 06/24/2019   NA 137 08/20/2019   K 3.6 08/20/2019   CL 107 08/20/2019   CREATININE 0.54 (L) 08/20/2019   BUN 18 08/20/2019   CO2 24 08/20/2019   INR 1.4 (H) 04/16/2019   HGBA1C 5.2 06/18/2019      Assessment / Plan:  #1 stable 4.2 cm ascending thoracic aneurysm #2 stable 4.5 cm proximal descending thoracic aneurysm #3 history of coronary artery disease currently the patient has no symptoms of angina status post coronary artery bypass grafting 28  years ago- #4 history of hypertension  #5 history of hyperlipidemia currently on simvastatin  Patient also has 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  I reviewed with patient the CT scan of the chest done today and do not recommend any surgical intervention for his aneurysmal disease based on its current size.  He was cautioned about strenuous lifting or the use of fluoroquinolone antibiotics.  We will plan to  see him back in 1 year with a follow-up CTA of the chest   Grace Isaac MD      [address] [place] [address] Office [phone]   Beeper [phone]  03/25/2020 3:36 PM

## 2020-03-26 ENCOUNTER — Ambulatory Visit: Payer: Medicare Other | Admitting: Cardiothoracic Surgery

## 2020-04-08 ENCOUNTER — Ambulatory Visit
Admission: RE | Admit: 2020-04-08 | Discharge: 2020-04-08 | Disposition: A | Discharge status: Home / Self Care | Payer: Medicare Other | Source: Ambulatory Visit | Attending: Cardiothoracic Surgery | Admitting: Cardiothoracic Surgery

## 2020-04-08 ENCOUNTER — Other Ambulatory Visit: Payer: Self-pay

## 2020-04-08 DIAGNOSIS — I712 Thoracic aortic aneurysm, without rupture, unspecified: Secondary | ICD-10-CM

## 2020-04-08 DIAGNOSIS — I700 Atherosclerosis of aorta: Secondary | ICD-10-CM | POA: Diagnosis not present

## 2020-04-08 DIAGNOSIS — J984 Other disorders of lung: Secondary | ICD-10-CM | POA: Diagnosis not present

## 2020-04-08 DIAGNOSIS — I2510 Atherosclerotic heart disease of native coronary artery without angina pectoris: Secondary | ICD-10-CM | POA: Diagnosis not present

## 2020-04-08 MED ORDER — IOPAMIDOL (ISOVUE-370) INJECTION 76%
75.0000 mL | Freq: Once | INTRAVENOUS | Status: AC | PRN
  Administered 2020-04-08: 75 mL via INTRAVENOUS

## 2020-04-09 ENCOUNTER — Ambulatory Visit: Payer: Medicare Other | Admitting: Cardiothoracic Surgery

## 2020-04-09 VITALS — BP 140/75 | HR 65 | Temp 97.6°F | Resp 20 | Ht 72.0 in | Wt 177.0 lb

## 2020-04-09 DIAGNOSIS — I712 Thoracic aortic aneurysm, without rupture, unspecified: Secondary | ICD-10-CM

## 2020-04-09 NOTE — Progress Notes (Signed)
[address]       [place] [address]             [phone]                    Jesse [name] [place] Medical Record #[id number] Date of Birth: 03-13-1928  Referring: Early, Jesse Meres, MD Primary Care: [name], MD Primary cardiology: [name], MD  Chief Complaint:    Chief Complaint  Patient presents with  . Thoracic Aortic Aneurysm    1 year f/u with Chest CTA    History of Present Illness:     Jesse [name] 86 y.o. male is seen in the office for follow-up of of  thoracic aneurysm. He has been followed by Dr Early  for abdominal aneurysm disease known since 1992 when he had CABG by me.   He had open abdominal aneurysm repair and bilateral femoral  aneurysm repair  In 1995       In the fall 2019 the patient developed a 5 unit GI bleed, transiently he was off aspirin but currently on 81 mg a day     He returns today with a follow-up CTA of the chest.  He has not wanted to have major cardiac surgery redo but has expressed a desire to know if his aneurysmal disease is getting worse.  He notes that he has fallen several times recently breaking both of his legs, is now recovering in a extended care facility   Current Activity/ Functional Status:  Patient is independent with mobility/ambulation, transfers, ADL's, IADL's.   Zubrod Score: At the time of surgery this patient's most appropriate activity status/level should be described as: []     0    Normal activity, no symptoms [x]     1    Restricted in physical strenuous activity but ambulatory, able to do out light work []     2    Ambulatory and capable of self care, unable to do work activities, up and about               >50 % of waking hours                              []     3    Only limited self care, in bed greater than 50% of waking hours []     4    Completely disabled, no self care, confined to bed or chair []     5    Moribund   Past Medical History:  Diagnosis Date  . AAA (abdominal  aortic aneurysm) ([place])   . Arthritis   . Cancer ([place]) 2010   prostate ( 40 Txs. of radiation treatments)  . Coronary artery disease   . Environmental allergies   . GERD (gastroesophageal reflux disease)   . History of prostate cancer 2010  . HOH (hard of hearing)    wears bilateral hearing aids  . Hyperlipidemia   . Hypertension   . Kidney stone 1968  . Myocardial infarction ([place]) 1992  . Pneumonia    hx of  . RBBB     Past Surgical History:  Procedure Laterality Date  . ABDOMINAL AORTIC ANEURYSM REPAIR  08-13-1993   Dr Cameron Sprang  . CARDIAC CATHETERIZATION    . COLONOSCOPY W/ POLYPECTOMY    . CORONARY ARTERY BYPASS GRAFT  03-01-1991   Dr. Servando Snare  .  ESOPHAGOGASTRODUODENOSCOPY (EGD) WITH PROPOFOL N/A 05/04/2018   Procedure: ESOPHAGOGASTRODUODENOSCOPY (EGD) WITH PROPOFOL;  Surgeon: [name], MD;  Location: [facility];  Service: Gastroenterology;  Laterality: N/A;  . EYE SURGERY     Detached retina (Left eye); bilateral cataract removal  . FALSE ANEURYSM REPAIR  05/07/2012   Procedure: REPAIR FALSE ANEURYSM;  Surgeon: Rosetta Posner, MD;  Location: [facility] OR;  Service: Vascular;  Laterality: Left;  Repair of Left Femoral Artery Aneurysm  . HERNIA REPAIR    . HOT HEMOSTASIS N/A 05/04/2018   Procedure: HOT HEMOSTASIS (ARGON PLASMA COAGULATION/BICAP);  Surgeon: [name], MD;  Location: [facility];  Service: Gastroenterology;  Laterality: N/A;  . INTRAMEDULLARY (IM) NAIL INTERTROCHANTERIC Left 04/16/2019   Procedure: INTRAMEDULLARY (IM) NAIL INTERTROCHANTRIC;  Surgeon: Rod Can, MD;  Location: [facility];  Service: Orthopedics;  Laterality: Left;  . INTRAMEDULLARY (IM) NAIL INTERTROCHANTERIC Right 06/17/2019   Procedure: RIGHT INTRAMEDULLARY (IM) NAIL INTERTROCHANTRIC;  Surgeon: Rod Can, MD;  Location: [facility];  Service: Orthopedics;  Laterality: Right;  . ORIF PATELLA Right 06/09/2015   Procedure: OPEN REDUCTION INTERNAL (ORIF) FIXATION PATELLA;  Surgeon: Marybelle Killings, MD;  Location: [facility];  Service: Orthopedics;  Laterality: Right;  . repair of bowel obstruction  1999   Dr. Dalbert Batman  . repair of ventral hernia  04-20-1994   P. Cameron Sprang MD  . resection femoral artery  03/19/2012   Dr. Curt Jews  . SUBMUCOSAL INJECTION  05/04/2018   Procedure: SUBMUCOSAL INJECTION;  Surgeon: [name], MD;  Location: [facility];  Service: Gastroenterology;;  . Jani Files RESECTION OF BLADDER TUMOR N/A 08/18/2019   Procedure: cysto fulgeration clot evacuation;  Surgeon: Cleon Gustin, MD;  Location: [facility];  Service: Urology;  Laterality: N/A;    Family History  Problem Relation Age of Onset  . Cancer Mother   . Cancer Father   . Varicose Veins Father   . Cancer Sister   . Diabetes Sister   . Heart attack Sister     Social History   Socioeconomic History  . Marital status: Widowed    Spouse name: Not on file  . Number of children: Not on file  . Years of education: Not on file  . Highest education level: Not on file  Occupational History  . Not on file  Tobacco Use  . Smoking status: Former Smoker    Years: 20.00    Types: Cigarettes    Quit date: 07/25/1968    Years since quitting: 51.7  . Smokeless tobacco: Never Used  Vaping Use  . Vaping Use: Never used  Substance and Sexual Activity  . Alcohol use: No  . Drug use: No  . Sexual activity: Not on file  Other Topics Concern  . Not on file  Social History Narrative  . Not on file   Social Determinants of Health    Social History   Tobacco Use  Smoking Status Former Smoker  . Years: 20.00  . Types: Cigarettes  . Quit date: 07/25/1968  . Years since quitting: 51.7  Smokeless Tobacco Never Used    Social History   Substance and Sexual Activity  Alcohol Use No     Allergies  Allergen Reactions  . Quinolones Other (See Comments)    unknown  . Adhesive [Tape] Itching and Rash    Current Outpatient Medications  Medication Sig Dispense Refill  . acetaminophen  (TYLENOL) 500 MG tablet Take 500 mg by mouth every 6 (six) hours as needed for mild  pain or headache.    . alendronate (FOSAMAX) 70 MG tablet Take 70 mg by mouth once a week. Saturday    . aspirin 81 MG tablet Take 1 tablet (81 mg total) by mouth daily. 30 tablet   . carboxymethylcellulose (REFRESH PLUS) 0.5 % SOLN Apply 2 drops to eye at bedtime.     . carvedilol (COREG) 12.5 MG tablet TAKE 1 TABLET BY MOUTH 2 TIMES DAILY WITH A MEAL (Patient taking differently: Take 12.5 mg by mouth 2 (two) times daily with a meal. ) 180 tablet 1  . Cyanocobalamin (B-12 PO) Take 1,000 mcg by mouth daily.     Marland Kitchen docusate sodium (COLACE) 100 MG capsule Take 2 capsules (200 mg total) by mouth 2 (two) times daily. 10 capsule 0  . fenofibrate 160 MG tablet Take 1 tablet (160 mg total) by mouth daily. 90 tablet 1  . ferrous sulfate 325 (65 FE) MG tablet Take 325 mg by mouth daily with breakfast.    . finasteride (PROSCAR) 5 MG tablet Take 5 mg by mouth daily.    Marland Kitchen loratadine (CLARITIN) 10 MG tablet Take 10 mg by mouth daily.     . Melatonin 3 MG TABS Take 3 mg by mouth at bedtime as needed (sleep).    . pantoprazole (PROTONIX) 40 MG tablet Take 40 mg by mouth daily.    Marland Kitchen pyridostigmine (MESTINON) 60 MG tablet Take 0.5 tablets (30 mg total) by mouth daily after breakfast. 3 tablet 0  . sertraline (ZOLOFT) 25 MG tablet Take 25 mg by mouth daily.    . simvastatin (ZOCOR) 40 MG tablet Take 1 tablet (40 mg total) by mouth daily at 6 PM. 90 tablet 1  . sodium chloride (OCEAN) 0.65 % SOLN nasal spray Place 2 sprays into both nostrils 2 (two) times daily.    . tamsulosin (FLOMAX) 0.4 MG CAPS capsule Take 0.4 mg by mouth daily.     No current facility-administered medications for this visit.      Review of Systems:     Cardiac Review of Systems: Y or N  Chest Pain [ N ]  Resting SOB [  N] Exertional SOB  [  N]  Orthopnea [ N ]   Pedal Edema Aqua.Slicker   ]    Palpitations Aqua.Slicker  ] Syncope  Aqua.Slicker  ]   Presyncope [  N ]  General  Review of Systems: [Y] = yes [  ]=no Constitional: recent weight change [ n ];  Wt loss over the last 3 months [   ] anorexia [  ]; fatigue [  ]; nausea [  ]; night sweats [  ]; fever [  ]; or chills [  ];          Dental: poor dentition[  ]; Last Dentist visit:   Eye : blurred vision [  ]; diplopia [   ]; vision changes [  ];  Amaurosis fugax[  ]; Resp: cough [ ] ;  wheezing[  ];  hemoptysis[  ]; shortness of breath[  ]; paroxysmal nocturnal dyspnea[] ; dyspnea on exertion[  ]; or orthopnea[ n ];  GI:  gallstones[  ], vomiting[  ];  dysphagia[  ]; melena[  ];  hematochezia [  ]; heartburn[  ];   Hx of  Colonoscopy[  ]; GU: kidney stones [  ]; hematuria[  ];   dysuria [  ];  nocturia[  ];  history of     obstruction [  ]; urinary frequency [  ]  Skin: rash, swelling[  ];, hair loss[  ];  peripheral edema[  ];  or itching[  ]; Musculosketetal: myalgias[  ];  joint swelling[  ];  joint erythema[  ];  joint pain[  ];  back pain[  ];  Heme/Lymph: bruising[  ];  bleeding[  ];  anemia[  ];  Neuro: TIA[  ];  headaches[  ];  stroke[  ];  vertigo[  ];  seizures[  n];   paresthesias[  ];  difficulty walking[ n ];  Psych:depression[  ]; anxiety[  ];  Endocrine: diabetes[  ];  thyroid dysfunction[  ];  Immunizations: Flu up to date Blue.Reese ]; Pneumococcal up to date [  ];  Other:  Physical Exam: BP 140/75   Pulse 65   Temp 97.6 F (36.4 C) (Skin)   Resp 20   Ht 6' (1.829 m)   Wt 177 lb (80.3 kg)   SpO2 98% Comment: RA  BMI 24.01 kg/m   PHYSICAL EXAMINATION: General appearance: alert, cooperative and no distress Neck: no adenopathy, no carotid bruit, no JVD, supple, symmetrical, trachea midline and thyroid not enlarged, symmetric, no tenderness/mass/nodules Cardio: regular rate and rhythm, S1, S2 normal, no murmur, click, rub or gallop GI: soft, non-tender; bowel sounds normal; no masses,  no organomegaly Extremities: extremities normal, atraumatic, no cyanosis or edema and Homans sign is  negative, no sign of DVT Neurologic: Grossly normal Bilateral groin incisions present well-healed Bilateral popliteal artery pulses easily felt DP PT pulses present bilaterally  Diagnostic Studies & Laboratory data:     Recent Radiology Findings:  CT ANGIO CHEST AORTA W/CM & OR WO/CM  Result Date: 04/08/2020 CLINICAL DATA:  Thoracic aortic prominence. History of prostate carcinoma EXAM: CT ANGIOGRAPHY CHEST WITH CONTRAST TECHNIQUE: Multidetector CT imaging of the chest was performed using the standard protocol during bolus administration of intravenous contrast. Multiplanar CT image reconstructions and MIPs were obtained to evaluate the vascular anatomy. CONTRAST:  10mL ISOVUE-370 IOPAMIDOL (ISOVUE-370) INJECTION 76% COMPARISON:  January 10, 2019 FINDINGS: Cardiovascular: The ascending thoracic diameter is stable, measuring 4.1 x 4.1 cm. There is stable dilatation of the proximal descending thoracic aorta with a measured diameter of 4.8 x 4.6 cm by remeasurement. There is atherosclerotic plaque in the periphery of the area of dilatation in the proximal descending aorta. There is no evident thoracic dissection. The thoracic aortic diameter at the sinuses of Valsalva measures 4.2 cm. Measured diameter of the aorta in the distal arch region measures 4.0 cm. There are scattered foci of calcification in the proximal visualized great vessels. There are foci of aortic calcification. There are multiple foci of coronary artery calcification. The patient is status post coronary artery bypass grafting. No pulmonary embolus is appreciable. There is no pericardial effusion or pericardial thickening. Mediastinum/Nodes: There is a 6 mm nodular opacity in the right lobe of the thyroid which per consensus guidelines does not warrant additional imaging surveillance. There is no appreciable thoracic adenopathy. No esophageal lesions are evident. Lungs/Pleura: There is scarring in the extreme apices. There is bibasilar scarring  with reticulonodular interstitial disease in the bases, also present previously. There is no edema or consolidation. No pleural effusions are evident. On axial slice 80 series 11, there is a stable 3 mm nodular opacity in the anterior segment of the right upper lobe. Upper Abdomen: There is cholelithiasis. There is incomplete visualization of a cyst arising from the lateral right kidney measuring 4.1 x 3.4 cm. There is upper abdominal aortic atherosclerosis. Musculoskeletal: No blastic or  lytic bone lesions. Status post median sternotomy. No chest wall lesions evident. Review of the MIP images confirms the above findings. IMPRESSION: 1. Thoracic aortic prominence, essentially stable. Dilatation of the aorta is most severe in the proximal descending thoracic aortic region with a maximum transverse diameter of 4.8 x 4.6 cm. Diameter in the mid to distal aortic arch region measures 4.0 cm. Recommend semi-annual imaging followup by CTA or MRA and referral to cardiothoracic surgery if not already obtained. This recommendation follows 2010 ACCF/AHA/AATS/ACR/ASA/SCA/SCAI/SIR/STS/SVM Guidelines for the Diagnosis and Management of Patients With Thoracic Aortic Disease. Circulation. 2010; 121: Y694-W54. Aortic aneurysm NOS (ICD10-I71.9). No dissection. There is aortic atherosclerosis as well as foci of great vessel and native coronary artery calcification. Status post coronary artery bypass grafting. 2. Stable bibasilar reticulonodular interstitial disease. Stable 3 mm nodular opacity right upper lobe. No edema or airspace consolidation. 3.  No adenopathy. 4.  No evident pulmonary embolus. 5.  Cholelithiasis. Aortic Atherosclerosis (ICD10-I70.0). Electronically Signed   By: Lowella Grip III M.D.   On: 04/08/2020 14:48   Not mentioned in the report is evidence of dilatation of the vein graft to the right coronary artery appears stable from previous scans, the right vein graft appears to be patent  I have independently  reviewed the above radiology studies  and reviewed the findings with the patient.   Ct Angio Chest Aorta W/cm &/or Wo/cm  Result Date: 10/27/2016 CLINICAL DATA:  Thoracic aortic aneurysm without rupture. EXAM: CT ANGIOGRAPHY CHEST WITH CONTRAST TECHNIQUE: Multidetector CT imaging of the chest was performed using the standard protocol during bolus administration of intravenous contrast. Multiplanar CT image reconstructions and MIPs were obtained to evaluate the vascular anatomy. CONTRAST:  75 mL of Isovue 370 intravenously. COMPARISON:  CT scan of May 18, 2016. FINDINGS: Cardiovascular: Stable 4.2 cm ascending thoracic aortic aneurysm is noted. Stable 4.5 cm proximal descending thoracic aortic aneurysm is noted. Aortic root measures 3.9 cm. Atherosclerosis of thoracic aorta is noted. No dissection is noted. Great vessels are widely patent. Status post coronary artery bypass graft. 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 Mediastinum/Nodes: No enlarged mediastinal, hilar, or axillary lymph nodes. Thyroid gland, trachea, and esophagus demonstrate no significant findings. Lungs/Pleura: Lungs are clear. No pleural effusion or pneumothorax. Upper Abdomen: Cholelithiasis is noted. No other significant abnormality is noted in the visualized portion of upper abdomen. Musculoskeletal: No chest wall abnormality. No acute or significant osseous findings. Review of the MIP images confirms the above findings. IMPRESSION: Stable 4.2 cm ascending thoracic aortic aneurysm. Stable 4.5 cm proximal descending thoracic aortic aneurysm. Recommend annual imaging followup by CTA or MRA. This recommendation follows 2010 ACCF/AHA/AATS/ACR/ASA/SCA/SCAI/SIR/STS/SVM Guidelines for the Diagnosis and Management of Patients with Thoracic Aortic Disease. Circulation. 2010; 121: O270-J500. Lithiasis. Electronically Signed   By: Marijo Conception, M.D.   On: 10/27/2016 13:52   Ct Angio Chest Pe W/cm &/or Wo  Cm  01/01/2016  CLINICAL DATA:  86 year old presenting with intermittent shortness of breath for the past several days which acutely worsened this morning. Intermittent cough. Elevated D-dimer. Prior CABG in 1992 and prior abdominal aortic aneurysm repair in 1995. EXAM: CT ANGIOGRAPHY CHEST WITH CONTRAST TECHNIQUE: Multidetector CT imaging of the chest was performed using the standard protocol during bolus administration of intravenous contrast. Multiplanar CT image reconstructions and MIPs were obtained to evaluate the vascular anatomy. CONTRAST:  100 ml Isovue 370 IV. COMPARISON:  No prior CT.  Multiple prior chest x-rays. FINDINGS: Technical quality:  Very good. Pulmonary  embolism:  Absent. Cardiovascular: Prior sternotomy for CABG. Heart moderately to markedly enlarged. Mild left ventricular enlargement and possible mild left ventricular hypertrophy. Right ventricular enlargement and right ventricular hypertrophy. No pericardial effusion. Moderate atherosclerosis involving the thoracic and upper abdominal aorta with evidence of the descending thoracic aortic aneurysm with maximum diameter approximating 4.5 cm. Mediastinum/Lymph Nodes: No pathologically enlarged mediastinal, hilar or axillary lymph nodes. No mediastinal masses. Normal-appearing esophagus. Visualized thyroid gland unremarkable. Lungs/Pleura: Streaky and patchy opacities in the left lower lobe. Lungs otherwise clear. No evidence of interstitial pulmonary edema. No pulmonary parenchymal nodules or masses. No pleural effusions. Central airways patent with moderate to marked bronchial wall thickening. Upper abdomen: Numerous calcified gallstones without evidence of acute cholecystitis. No acute abnormalities. Musculoskeletal: Severe osseous demineralization. Exaggeration of the usual kyphosis. Mild degenerative disc disease and spondylosis involving the mid and lower thoracic spine. Review of the MIP images confirms the above findings. IMPRESSION: 1.  No evidence of pulmonary embolism. 2. Moderate to marked cardiomegaly with biventricular enlarged. 3. Approximate 4.5 cm descending thoracic aortic aneurysm. Recommend annual imaging followup by CTA or MRA. This recommendation follows 2010 ACCF/AHA/AATS/ACR/ASA/SCA/SCAI/SIR/STS/SVM Guidelines for the Diagnosis and Management of Patients with Thoracic Aortic Disease. Circulation. 2010; 121: W409-B353. 4. Left lower lobe atelectasis and/or bronchopneumonia. Moderate to severe changes of bronchitis and/or asthma. 5. Cholelithiasis without evidence of acute cholecystitis. Electronically Signed   By: Evangeline Dakin M.D.   On: 01/01/2016 15:10   Dg Abd 2 Views  01/01/2016  CLINICAL DATA:  Abdominal pain for 1 week.  Nausea. EXAM: ABDOMEN - 2 VIEW COMPARISON:  None. FINDINGS: Atelectasis seen in the left lung base. Lung bases otherwise normal. No free air, portal venous gas, or pneumatosis. Surgical clips are seen within the mid central abdomen. Surgical clips are seen in the pelvis is well. There is no evidence of bowel obstruction on this study. Both kidneys are largely obscured by bowel contents but no renal stones are identified. IMPRESSION: No acute abnormalities. Electronically Signed   By: Dorise Bullion III M.D   On: 01/01/2016 12:57     Recent Lab Findings: Lab Results  Component Value Date   WBC 5.8 08/20/2019   HGB 8.9 (L) 08/20/2019   HCT 28.5 (L) 08/20/2019   PLT 216 08/20/2019   GLUCOSE 102 (H) 08/20/2019   CHOL 106 03/02/2017   TRIG 57 03/02/2017   HDL 32 (L) 03/02/2017   LDLCALC 63 03/02/2017   ALT 19 06/24/2019   AST 19 06/24/2019   NA 137 08/20/2019   K 3.6 08/20/2019   CL 107 08/20/2019   CREATININE 0.54 (L) 08/20/2019   BUN 18 08/20/2019   CO2 24 08/20/2019   INR 1.4 (H) 04/16/2019   HGBA1C 5.2 06/18/2019      Assessment / Plan:  #1 stable 4.2 cm ascending thoracic aneurysm #2 stable 4.5 cm proximal descending thoracic aneurysm #3 history of coronary artery disease  currently the patient has no symptoms of angina status post coronary artery bypass grafting 28  years ago- #4 history of hypertension  #5 history of hyperlipidemia currently on simvastatin    I reviewed with patient the CT scan of the chest done today and do not recommend any surgical intervention for his aneurysmal disease based on its current size.  He was cautioned about strenuous lifting or the use of fluoroquinolone antibiotics.  Patient notes that in several weeks he will be 86 years old, but he would like to come back in 1 year for follow-up  Grace Isaac MD      [address] [place] [address] Office [phone]   Beeper [phone]  04/10/2020 12:46 PM

## 2020-04-15 DIAGNOSIS — I10 Essential (primary) hypertension: Secondary | ICD-10-CM | POA: Diagnosis not present

## 2020-04-15 DIAGNOSIS — D638 Anemia in other chronic diseases classified elsewhere: Secondary | ICD-10-CM | POA: Diagnosis not present

## 2020-04-15 DIAGNOSIS — H532 Diplopia: Secondary | ICD-10-CM | POA: Diagnosis not present

## 2020-04-15 DIAGNOSIS — I2510 Atherosclerotic heart disease of native coronary artery without angina pectoris: Secondary | ICD-10-CM | POA: Diagnosis not present

## 2020-04-17 DIAGNOSIS — R2689 Other abnormalities of gait and mobility: Secondary | ICD-10-CM | POA: Diagnosis not present

## 2020-04-17 DIAGNOSIS — M6281 Muscle weakness (generalized): Secondary | ICD-10-CM | POA: Diagnosis not present

## 2020-04-17 DIAGNOSIS — B351 Tinea unguium: Secondary | ICD-10-CM | POA: Diagnosis not present

## 2020-04-17 DIAGNOSIS — R278 Other lack of coordination: Secondary | ICD-10-CM | POA: Diagnosis not present

## 2020-04-17 DIAGNOSIS — M79674 Pain in right toe(s): Secondary | ICD-10-CM | POA: Diagnosis not present

## 2020-04-17 DIAGNOSIS — M2041 Other hammer toe(s) (acquired), right foot: Secondary | ICD-10-CM | POA: Diagnosis not present

## 2020-04-17 DIAGNOSIS — I739 Peripheral vascular disease, unspecified: Secondary | ICD-10-CM | POA: Diagnosis not present

## 2020-04-17 DIAGNOSIS — R2681 Unsteadiness on feet: Secondary | ICD-10-CM | POA: Diagnosis not present

## 2020-04-30 DIAGNOSIS — Z961 Presence of intraocular lens: Secondary | ICD-10-CM | POA: Diagnosis not present

## 2020-04-30 DIAGNOSIS — H35373 Puckering of macula, bilateral: Secondary | ICD-10-CM | POA: Diagnosis not present

## 2020-04-30 DIAGNOSIS — H16223 Keratoconjunctivitis sicca, not specified as Sjogren's, bilateral: Secondary | ICD-10-CM | POA: Diagnosis not present

## 2020-04-30 DIAGNOSIS — H532 Diplopia: Secondary | ICD-10-CM | POA: Diagnosis not present

## 2020-05-21 DIAGNOSIS — I10 Essential (primary) hypertension: Secondary | ICD-10-CM | POA: Diagnosis not present

## 2020-05-21 DIAGNOSIS — N403 Nodular prostate with lower urinary tract symptoms: Secondary | ICD-10-CM | POA: Diagnosis not present

## 2020-05-21 DIAGNOSIS — D511 Vitamin B12 deficiency anemia due to selective vitamin B12 malabsorption with proteinuria: Secondary | ICD-10-CM | POA: Diagnosis not present

## 2020-05-21 DIAGNOSIS — E559 Vitamin D deficiency, unspecified: Secondary | ICD-10-CM | POA: Diagnosis not present

## 2020-05-27 DIAGNOSIS — M1990 Unspecified osteoarthritis, unspecified site: Secondary | ICD-10-CM | POA: Diagnosis not present

## 2020-05-27 DIAGNOSIS — K219 Gastro-esophageal reflux disease without esophagitis: Secondary | ICD-10-CM | POA: Diagnosis not present

## 2020-05-27 DIAGNOSIS — S7291XD Unspecified fracture of right femur, subsequent encounter for closed fracture with routine healing: Secondary | ICD-10-CM | POA: Diagnosis not present

## 2020-05-27 DIAGNOSIS — G8918 Other acute postprocedural pain: Secondary | ICD-10-CM | POA: Diagnosis not present

## 2020-05-27 DIAGNOSIS — I252 Old myocardial infarction: Secondary | ICD-10-CM | POA: Diagnosis not present

## 2020-05-27 DIAGNOSIS — I712 Thoracic aortic aneurysm, without rupture: Secondary | ICD-10-CM | POA: Diagnosis not present

## 2020-05-27 DIAGNOSIS — F418 Other specified anxiety disorders: Secondary | ICD-10-CM | POA: Diagnosis not present

## 2020-05-27 DIAGNOSIS — H9193 Unspecified hearing loss, bilateral: Secondary | ICD-10-CM | POA: Diagnosis not present

## 2020-05-27 DIAGNOSIS — I2510 Atherosclerotic heart disease of native coronary artery without angina pectoris: Secondary | ICD-10-CM | POA: Diagnosis not present

## 2020-05-27 DIAGNOSIS — I700 Atherosclerosis of aorta: Secondary | ICD-10-CM | POA: Diagnosis not present

## 2020-05-27 DIAGNOSIS — K8020 Calculus of gallbladder without cholecystitis without obstruction: Secondary | ICD-10-CM | POA: Diagnosis not present

## 2020-05-27 DIAGNOSIS — I4510 Unspecified right bundle-branch block: Secondary | ICD-10-CM | POA: Diagnosis not present

## 2020-05-27 DIAGNOSIS — I110 Hypertensive heart disease with heart failure: Secondary | ICD-10-CM | POA: Diagnosis not present

## 2020-05-27 DIAGNOSIS — I5020 Unspecified systolic (congestive) heart failure: Secondary | ICD-10-CM | POA: Diagnosis not present

## 2020-05-27 DIAGNOSIS — J849 Interstitial pulmonary disease, unspecified: Secondary | ICD-10-CM | POA: Diagnosis not present

## 2020-05-27 DIAGNOSIS — E785 Hyperlipidemia, unspecified: Secondary | ICD-10-CM | POA: Diagnosis not present

## 2020-05-28 DIAGNOSIS — H43811 Vitreous degeneration, right eye: Secondary | ICD-10-CM | POA: Diagnosis not present

## 2020-05-28 DIAGNOSIS — H35371 Puckering of macula, right eye: Secondary | ICD-10-CM | POA: Diagnosis not present

## 2020-05-28 DIAGNOSIS — H35421 Microcystoid degeneration of retina, right eye: Secondary | ICD-10-CM | POA: Diagnosis not present

## 2020-05-28 DIAGNOSIS — H35033 Hypertensive retinopathy, bilateral: Secondary | ICD-10-CM | POA: Diagnosis not present

## 2020-06-03 DIAGNOSIS — M1388 Other specified arthritis, other site: Secondary | ICD-10-CM | POA: Diagnosis not present

## 2020-06-03 DIAGNOSIS — R2681 Unsteadiness on feet: Secondary | ICD-10-CM | POA: Diagnosis not present

## 2020-06-03 DIAGNOSIS — M6281 Muscle weakness (generalized): Secondary | ICD-10-CM | POA: Diagnosis not present

## 2020-06-04 DIAGNOSIS — C61 Malignant neoplasm of prostate: Secondary | ICD-10-CM | POA: Diagnosis not present

## 2020-06-04 DIAGNOSIS — R310 Gross hematuria: Secondary | ICD-10-CM | POA: Diagnosis not present

## 2020-06-29 DIAGNOSIS — I5020 Unspecified systolic (congestive) heart failure: Secondary | ICD-10-CM | POA: Diagnosis not present

## 2020-06-29 DIAGNOSIS — J849 Interstitial pulmonary disease, unspecified: Secondary | ICD-10-CM | POA: Diagnosis not present

## 2020-06-29 DIAGNOSIS — S7291XD Unspecified fracture of right femur, subsequent encounter for closed fracture with routine healing: Secondary | ICD-10-CM | POA: Diagnosis not present

## 2020-06-29 DIAGNOSIS — F418 Other specified anxiety disorders: Secondary | ICD-10-CM | POA: Diagnosis not present

## 2020-06-29 DIAGNOSIS — I2510 Atherosclerotic heart disease of native coronary artery without angina pectoris: Secondary | ICD-10-CM | POA: Diagnosis not present

## 2020-06-29 DIAGNOSIS — I110 Hypertensive heart disease with heart failure: Secondary | ICD-10-CM | POA: Diagnosis not present

## 2020-06-29 DIAGNOSIS — H9193 Unspecified hearing loss, bilateral: Secondary | ICD-10-CM | POA: Diagnosis not present

## 2020-06-29 DIAGNOSIS — I4510 Unspecified right bundle-branch block: Secondary | ICD-10-CM | POA: Diagnosis not present

## 2020-06-29 DIAGNOSIS — G8918 Other acute postprocedural pain: Secondary | ICD-10-CM | POA: Diagnosis not present

## 2020-06-29 DIAGNOSIS — K219 Gastro-esophageal reflux disease without esophagitis: Secondary | ICD-10-CM | POA: Diagnosis not present

## 2020-06-29 DIAGNOSIS — K8020 Calculus of gallbladder without cholecystitis without obstruction: Secondary | ICD-10-CM | POA: Diagnosis not present

## 2020-06-29 DIAGNOSIS — M1990 Unspecified osteoarthritis, unspecified site: Secondary | ICD-10-CM | POA: Diagnosis not present

## 2020-06-29 DIAGNOSIS — I712 Thoracic aortic aneurysm, without rupture: Secondary | ICD-10-CM | POA: Diagnosis not present

## 2020-06-29 DIAGNOSIS — I700 Atherosclerosis of aorta: Secondary | ICD-10-CM | POA: Diagnosis not present

## 2020-06-29 DIAGNOSIS — E785 Hyperlipidemia, unspecified: Secondary | ICD-10-CM | POA: Diagnosis not present

## 2020-06-29 DIAGNOSIS — I252 Old myocardial infarction: Secondary | ICD-10-CM | POA: Diagnosis not present

## 2020-07-16 DIAGNOSIS — I25810 Atherosclerosis of coronary artery bypass graft(s) without angina pectoris: Secondary | ICD-10-CM | POA: Diagnosis not present

## 2020-07-16 DIAGNOSIS — I7789 Other specified disorders of arteries and arterioles: Secondary | ICD-10-CM | POA: Diagnosis not present

## 2020-07-16 DIAGNOSIS — D638 Anemia in other chronic diseases classified elsewhere: Secondary | ICD-10-CM | POA: Diagnosis not present

## 2020-07-16 DIAGNOSIS — I10 Essential (primary) hypertension: Secondary | ICD-10-CM | POA: Diagnosis not present

## 2020-08-01 IMAGING — RF DG HIP (WITH PELVIS) OPERATIVE*L*
1 series · 2 of 2 positions shown · non-contrast
Comparison: 04/15/2019

CLINICAL DATA: Left-sided intramedullary nail placement.

EXAM:
OPERATIVE LEFT HIP (WITH PELVIS IF PERFORMED) 2 VIEWS
TECHNIQUE: Fluoroscopic spot image(s) were submitted for interpretation
post-operatively.

[Series 1: unknown protocol · 0.20mm/px · 2 of 2 slices shown]
[im 1/2]
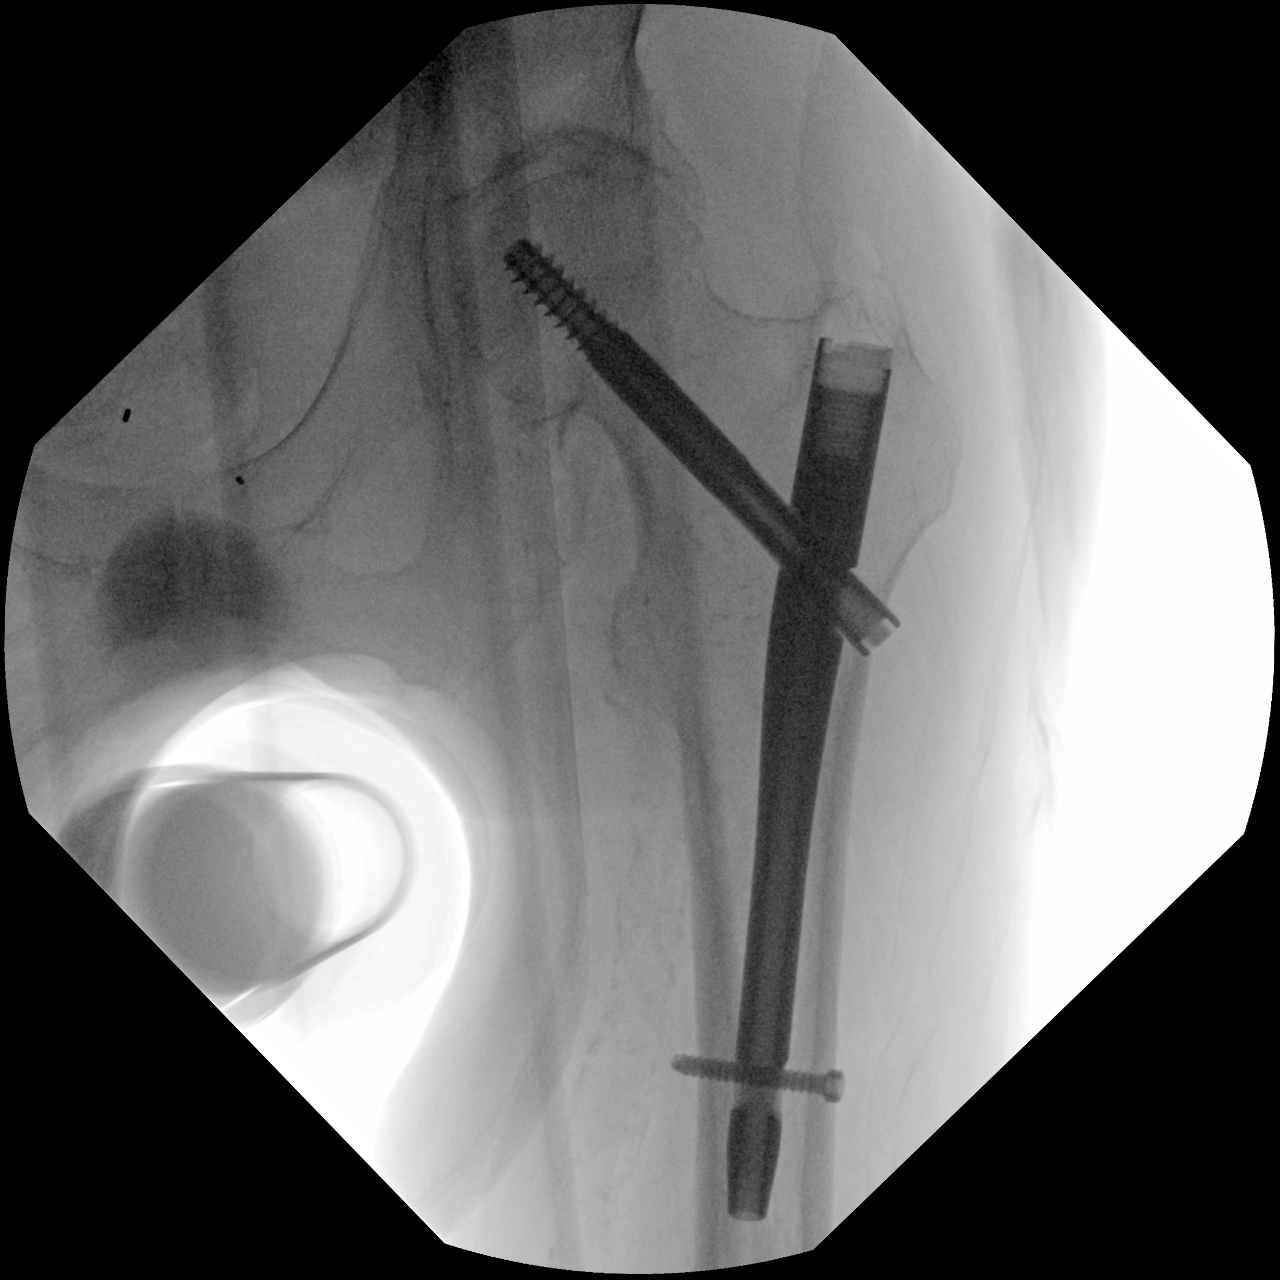
[im 2/2]
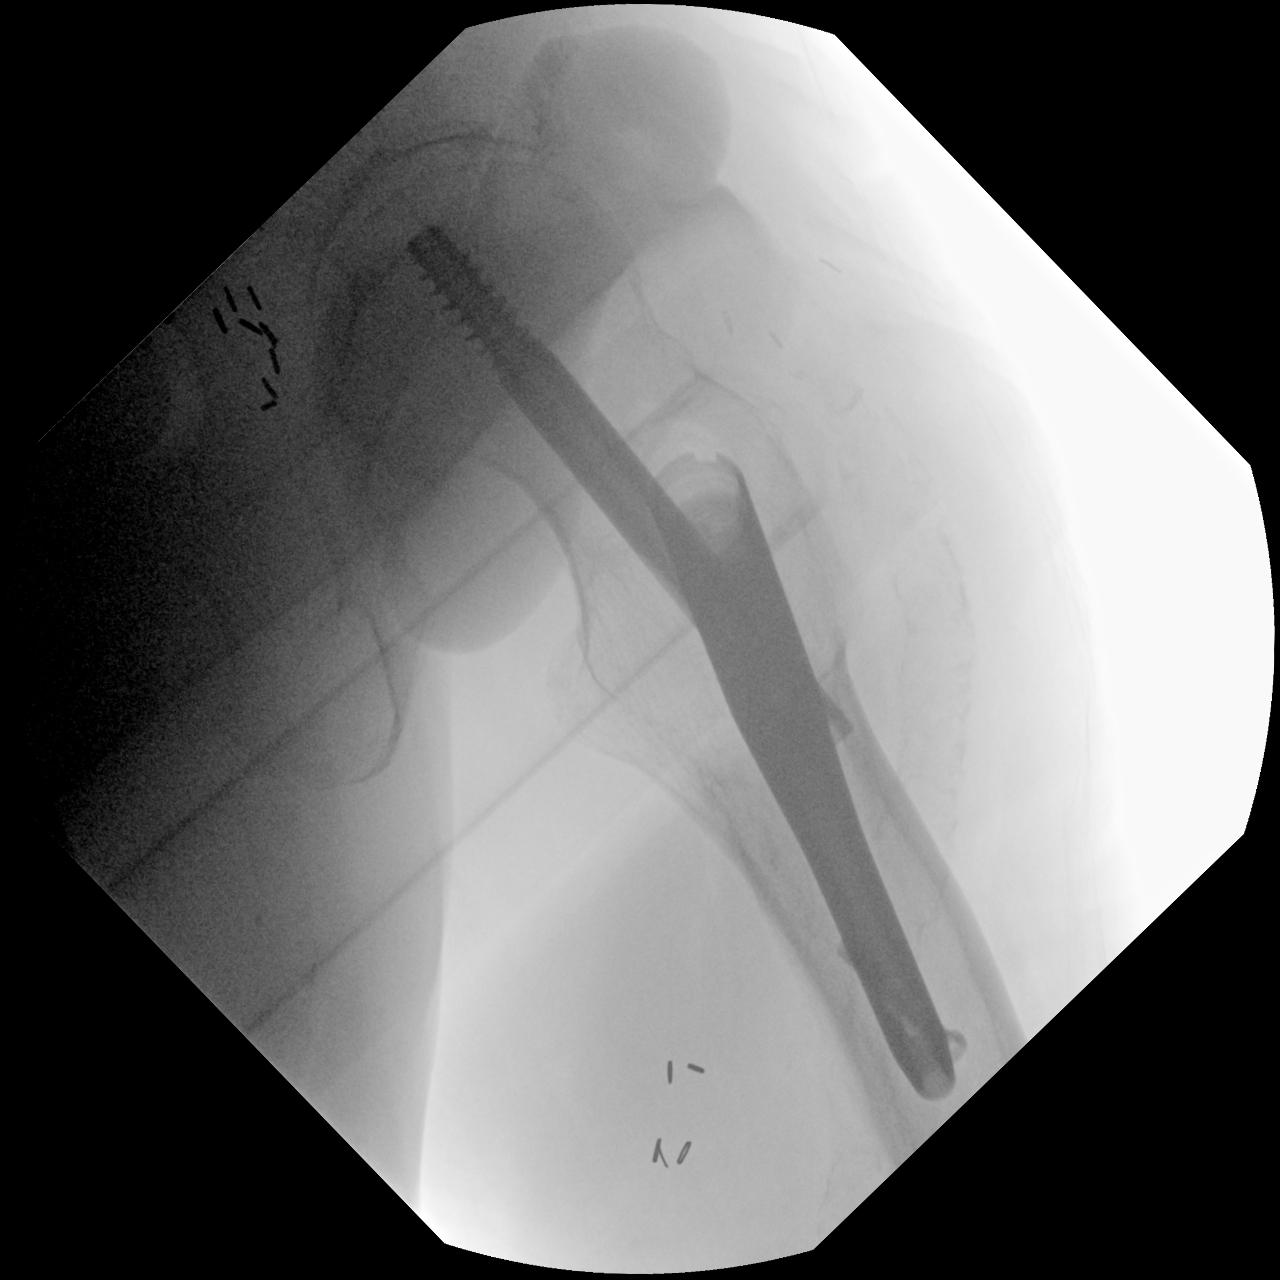

[2 of 2 positions shown; findings below may reference images not displayed]

FINDINGS: Examination demonstrates intramedullary nail placement over the left
proximal femur with associated screw bridging the femoral neck into
the femoral head as hardware is intact. Hardware bridges patient's
intertrochanteric fracture as there is normal alignment over the
fracture site. Mild degenerate change of the left hip.
IMPRESSION: Expected changes post fixation of intertrochanteric left femoral
fracture.

## 2020-08-13 DIAGNOSIS — F413 Other mixed anxiety disorders: Secondary | ICD-10-CM | POA: Diagnosis not present

## 2020-08-13 DIAGNOSIS — G4700 Insomnia, unspecified: Secondary | ICD-10-CM | POA: Diagnosis not present

## 2020-08-13 DIAGNOSIS — F32A Depression, unspecified: Secondary | ICD-10-CM | POA: Diagnosis not present

## 2020-08-27 DIAGNOSIS — G4700 Insomnia, unspecified: Secondary | ICD-10-CM | POA: Diagnosis not present

## 2020-08-27 DIAGNOSIS — F32A Depression, unspecified: Secondary | ICD-10-CM | POA: Diagnosis not present

## 2020-08-27 DIAGNOSIS — G479 Sleep disorder, unspecified: Secondary | ICD-10-CM | POA: Diagnosis not present

## 2020-08-27 DIAGNOSIS — R600 Localized edema: Secondary | ICD-10-CM | POA: Diagnosis not present

## 2020-08-27 DIAGNOSIS — F413 Other mixed anxiety disorders: Secondary | ICD-10-CM | POA: Diagnosis not present

## 2020-08-27 DIAGNOSIS — R635 Abnormal weight gain: Secondary | ICD-10-CM | POA: Diagnosis not present

## 2020-08-27 DIAGNOSIS — I5030 Unspecified diastolic (congestive) heart failure: Secondary | ICD-10-CM | POA: Diagnosis not present

## 2020-09-01 DIAGNOSIS — E559 Vitamin D deficiency, unspecified: Secondary | ICD-10-CM | POA: Diagnosis not present

## 2020-09-01 DIAGNOSIS — I10 Essential (primary) hypertension: Secondary | ICD-10-CM | POA: Diagnosis not present

## 2020-09-24 DIAGNOSIS — G4700 Insomnia, unspecified: Secondary | ICD-10-CM | POA: Diagnosis not present

## 2020-09-24 DIAGNOSIS — F32A Depression, unspecified: Secondary | ICD-10-CM | POA: Diagnosis not present

## 2020-09-24 DIAGNOSIS — F413 Other mixed anxiety disorders: Secondary | ICD-10-CM | POA: Diagnosis not present

## 2020-10-16 DIAGNOSIS — F418 Other specified anxiety disorders: Secondary | ICD-10-CM | POA: Diagnosis not present

## 2020-10-16 DIAGNOSIS — Z8546 Personal history of malignant neoplasm of prostate: Secondary | ICD-10-CM | POA: Diagnosis not present

## 2020-10-16 DIAGNOSIS — R600 Localized edema: Secondary | ICD-10-CM | POA: Diagnosis not present

## 2020-10-16 DIAGNOSIS — F0390 Unspecified dementia without behavioral disturbance: Secondary | ICD-10-CM | POA: Diagnosis not present

## 2020-11-19 DIAGNOSIS — F32A Depression, unspecified: Secondary | ICD-10-CM | POA: Diagnosis not present

## 2020-11-19 DIAGNOSIS — F413 Other mixed anxiety disorders: Secondary | ICD-10-CM | POA: Diagnosis not present

## 2020-11-19 DIAGNOSIS — G4700 Insomnia, unspecified: Secondary | ICD-10-CM | POA: Diagnosis not present

## 2020-11-26 DIAGNOSIS — R310 Gross hematuria: Secondary | ICD-10-CM | POA: Diagnosis not present

## 2020-11-26 DIAGNOSIS — C61 Malignant neoplasm of prostate: Secondary | ICD-10-CM | POA: Diagnosis not present

## 2020-12-12 ENCOUNTER — Inpatient Hospital Stay (HOSPITAL_COMMUNITY): Payer: Medicare Other

## 2020-12-12 ENCOUNTER — Inpatient Hospital Stay (HOSPITAL_COMMUNITY)
Admission: EM | Admit: 2020-12-12 | Discharge: 2020-12-25 | DRG: 389 | Disposition: A | Discharge status: Nursing Home (Includes ICF) | Payer: Medicare Other | Source: Skilled Nursing Facility | Attending: Internal Medicine | Admitting: Internal Medicine

## 2020-12-12 ENCOUNTER — Other Ambulatory Visit: Payer: Self-pay

## 2020-12-12 ENCOUNTER — Emergency Department (HOSPITAL_COMMUNITY): Payer: Medicare Other

## 2020-12-12 ENCOUNTER — Encounter (HOSPITAL_COMMUNITY): Payer: Self-pay | Admitting: Emergency Medicine

## 2020-12-12 DIAGNOSIS — I25810 Atherosclerosis of coronary artery bypass graft(s) without angina pectoris: Secondary | ICD-10-CM | POA: Diagnosis present

## 2020-12-12 DIAGNOSIS — Z923 Personal history of irradiation: Secondary | ICD-10-CM

## 2020-12-12 DIAGNOSIS — Z9889 Other specified postprocedural states: Secondary | ICD-10-CM | POA: Diagnosis not present

## 2020-12-12 DIAGNOSIS — K5669 Other partial intestinal obstruction: Secondary | ICD-10-CM | POA: Diagnosis not present

## 2020-12-12 DIAGNOSIS — R066 Hiccough: Secondary | ICD-10-CM | POA: Diagnosis present

## 2020-12-12 DIAGNOSIS — Z66 Do not resuscitate: Secondary | ICD-10-CM | POA: Diagnosis not present

## 2020-12-12 DIAGNOSIS — K219 Gastro-esophageal reflux disease without esophagitis: Secondary | ICD-10-CM | POA: Diagnosis present

## 2020-12-12 DIAGNOSIS — I252 Old myocardial infarction: Secondary | ICD-10-CM

## 2020-12-12 DIAGNOSIS — I10 Essential (primary) hypertension: Secondary | ICD-10-CM | POA: Diagnosis present

## 2020-12-12 DIAGNOSIS — Z20822 Contact with and (suspected) exposure to covid-19: Secondary | ICD-10-CM | POA: Diagnosis present

## 2020-12-12 DIAGNOSIS — R8271 Bacteriuria: Secondary | ICD-10-CM | POA: Diagnosis present

## 2020-12-12 DIAGNOSIS — I723 Aneurysm of iliac artery: Secondary | ICD-10-CM | POA: Diagnosis present

## 2020-12-12 DIAGNOSIS — Z8616 Personal history of COVID-19: Secondary | ICD-10-CM | POA: Diagnosis not present

## 2020-12-12 DIAGNOSIS — I110 Hypertensive heart disease with heart failure: Secondary | ICD-10-CM | POA: Diagnosis not present

## 2020-12-12 DIAGNOSIS — Z974 Presence of external hearing-aid: Secondary | ICD-10-CM | POA: Diagnosis not present

## 2020-12-12 DIAGNOSIS — I2510 Atherosclerotic heart disease of native coronary artery without angina pectoris: Secondary | ICD-10-CM | POA: Diagnosis present

## 2020-12-12 DIAGNOSIS — Z7189 Other specified counseling: Secondary | ICD-10-CM | POA: Diagnosis not present

## 2020-12-12 DIAGNOSIS — I4510 Unspecified right bundle-branch block: Secondary | ICD-10-CM | POA: Diagnosis present

## 2020-12-12 DIAGNOSIS — K5651 Intestinal adhesions [bands], with partial obstruction: Principal | ICD-10-CM | POA: Diagnosis present

## 2020-12-12 DIAGNOSIS — G588 Other specified mononeuropathies: Secondary | ICD-10-CM | POA: Diagnosis present

## 2020-12-12 DIAGNOSIS — Z515 Encounter for palliative care: Secondary | ICD-10-CM

## 2020-12-12 DIAGNOSIS — K439 Ventral hernia without obstruction or gangrene: Secondary | ICD-10-CM | POA: Diagnosis present

## 2020-12-12 DIAGNOSIS — Z8249 Family history of ischemic heart disease and other diseases of the circulatory system: Secondary | ICD-10-CM

## 2020-12-12 DIAGNOSIS — I739 Peripheral vascular disease, unspecified: Secondary | ICD-10-CM | POA: Diagnosis present

## 2020-12-12 DIAGNOSIS — Z8546 Personal history of malignant neoplasm of prostate: Secondary | ICD-10-CM | POA: Diagnosis not present

## 2020-12-12 DIAGNOSIS — Z452 Encounter for adjustment and management of vascular access device: Secondary | ICD-10-CM | POA: Diagnosis not present

## 2020-12-12 DIAGNOSIS — Z951 Presence of aortocoronary bypass graft: Secondary | ICD-10-CM

## 2020-12-12 DIAGNOSIS — I714 Abdominal aortic aneurysm, without rupture, unspecified: Secondary | ICD-10-CM | POA: Diagnosis present

## 2020-12-12 DIAGNOSIS — M1990 Unspecified osteoarthritis, unspecified site: Secondary | ICD-10-CM | POA: Diagnosis not present

## 2020-12-12 DIAGNOSIS — F419 Anxiety disorder, unspecified: Secondary | ICD-10-CM | POA: Diagnosis present

## 2020-12-12 DIAGNOSIS — K56609 Unspecified intestinal obstruction, unspecified as to partial versus complete obstruction: Secondary | ICD-10-CM | POA: Diagnosis not present

## 2020-12-12 DIAGNOSIS — R110 Nausea: Secondary | ICD-10-CM | POA: Diagnosis not present

## 2020-12-12 DIAGNOSIS — Z79899 Other long term (current) drug therapy: Secondary | ICD-10-CM

## 2020-12-12 DIAGNOSIS — I25709 Atherosclerosis of coronary artery bypass graft(s), unspecified, with unspecified angina pectoris: Secondary | ICD-10-CM

## 2020-12-12 DIAGNOSIS — Z87891 Personal history of nicotine dependence: Secondary | ICD-10-CM

## 2020-12-12 DIAGNOSIS — E876 Hypokalemia: Secondary | ICD-10-CM | POA: Diagnosis present

## 2020-12-12 DIAGNOSIS — H9190 Unspecified hearing loss, unspecified ear: Secondary | ICD-10-CM | POA: Diagnosis present

## 2020-12-12 DIAGNOSIS — I5022 Chronic systolic (congestive) heart failure: Secondary | ICD-10-CM | POA: Diagnosis not present

## 2020-12-12 DIAGNOSIS — Z7982 Long term (current) use of aspirin: Secondary | ICD-10-CM

## 2020-12-12 DIAGNOSIS — N3001 Acute cystitis with hematuria: Secondary | ICD-10-CM

## 2020-12-12 DIAGNOSIS — E7800 Pure hypercholesterolemia, unspecified: Secondary | ICD-10-CM | POA: Diagnosis not present

## 2020-12-12 DIAGNOSIS — E785 Hyperlipidemia, unspecified: Secondary | ICD-10-CM | POA: Diagnosis not present

## 2020-12-12 DIAGNOSIS — Z888 Allergy status to other drugs, medicaments and biological substances status: Secondary | ICD-10-CM

## 2020-12-12 DIAGNOSIS — R109 Unspecified abdominal pain: Secondary | ICD-10-CM | POA: Diagnosis not present

## 2020-12-12 DIAGNOSIS — K8080 Other cholelithiasis without obstruction: Secondary | ICD-10-CM | POA: Diagnosis not present

## 2020-12-12 DIAGNOSIS — Z0189 Encounter for other specified special examinations: Secondary | ICD-10-CM

## 2020-12-12 DIAGNOSIS — Z4682 Encounter for fitting and adjustment of non-vascular catheter: Secondary | ICD-10-CM | POA: Diagnosis not present

## 2020-12-12 DIAGNOSIS — K8020 Calculus of gallbladder without cholecystitis without obstruction: Secondary | ICD-10-CM | POA: Diagnosis not present

## 2020-12-12 DIAGNOSIS — Z91048 Other nonmedicinal substance allergy status: Secondary | ICD-10-CM

## 2020-12-12 DIAGNOSIS — Z7983 Long term (current) use of bisphosphonates: Secondary | ICD-10-CM

## 2020-12-12 HISTORY — DX: Unspecified intestinal obstruction, unspecified as to partial versus complete obstruction: K56.609

## 2020-12-12 LAB — COMPREHENSIVE METABOLIC PANEL
ALT: 14 U/L (ref 0–44)
AST: 15 U/L (ref 15–41)
Albumin: 4.1 g/dL (ref 3.5–5.0)
Alkaline Phosphatase: 38 U/L (ref 38–126)
Anion gap: 7 (ref 5–15)
BUN: 28 mg/dL — ABNORMAL HIGH (ref 8–23)
CO2: 24 mmol/L (ref 22–32)
Calcium: 9.6 mg/dL (ref 8.9–10.3)
Chloride: 105 mmol/L (ref 98–111)
Creatinine, Ser: 0.88 mg/dL (ref 0.61–1.24)
GFR, Estimated: >60 mL/min (ref >60)
Glucose, Bld: 122 mg/dL — ABNORMAL HIGH (ref 70–99)
Potassium: 4.2 mmol/L (ref 3.5–5.1)
Sodium: 136 mmol/L (ref 135–145)
Total Bilirubin: 1.1 mg/dL (ref 0.3–1.2)
Total Protein: 7 g/dL (ref 6.5–8.1)

## 2020-12-12 LAB — URINALYSIS, ROUTINE W REFLEX MICROSCOPIC
Bacteria, UA: NONE SEEN
Bilirubin Urine: NEGATIVE
Glucose, UA: NEGATIVE mg/dL
Ketones, ur: 5 mg/dL — AB
Nitrite: NEGATIVE
Protein, ur: 100 mg/dL — AB
RBC / HPF: >50 RBC/hpf — ABNORMAL HIGH (ref 0–5)
Specific Gravity, Urine: 1.025 (ref 1.005–1.030)
pH: 6 (ref 5.0–8.0)

## 2020-12-12 LAB — CBC
HCT: 40.5 % (ref 39.0–52.0)
Hemoglobin: 12.9 g/dL — ABNORMAL LOW (ref 13.0–17.0)
MCH: 29.4 pg (ref 26.0–34.0)
MCHC: 31.9 g/dL (ref 30.0–36.0)
MCV: 92.3 fL (ref 80.0–100.0)
Platelets: 232 10*3/uL (ref 150–400)
RBC: 4.39 MIL/uL (ref 4.22–5.81)
RDW: 13.5 % (ref 11.5–15.5)
WBC: 8.6 10*3/uL (ref 4.0–10.5)
nRBC: 0 % (ref 0.0–0.2)

## 2020-12-12 LAB — SARS CORONAVIRUS 2 (TAT 6-24 HRS): SARS Coronavirus 2: NEGATIVE

## 2020-12-12 LAB — LIPASE, BLOOD: Lipase: 29 U/L (ref 11–51)

## 2020-12-12 MED ORDER — METOPROLOL TARTRATE 5 MG/5ML IV SOLN: 5.0000 mg | Freq: Four times a day (QID) | INTRAVENOUS | Status: DC | PRN

## 2020-12-12 MED ORDER — FENTANYL CITRATE (PF) 100 MCG/2ML IJ SOLN
50.0000 ug | Freq: Once | INTRAMUSCULAR | Status: AC
  Administered 2020-12-12: 50 ug via INTRAVENOUS
  Filled 2020-12-12: qty 2

## 2020-12-12 MED ORDER — PHENOL 1.4 % MT LIQD
2.0000 | OROMUCOSAL | Status: DC | PRN
  Filled 2020-12-12: qty 177

## 2020-12-12 MED ORDER — HEPARIN SODIUM (PORCINE) 5000 UNIT/ML IJ SOLN
5000.0000 [IU] | Freq: Three times a day (TID) | INTRAMUSCULAR | Status: DC
  Administered 2020-12-12 – 2020-12-25 (×41): 5000 [IU] via SUBCUTANEOUS
  Filled 2020-12-12 (×41): qty 1

## 2020-12-12 MED ORDER — ONDANSETRON HCL 4 MG PO TABS: 4.0000 mg | ORAL_TABLET | Freq: Four times a day (QID) | ORAL | Status: DC | PRN

## 2020-12-12 MED ORDER — ONDANSETRON HCL 4 MG/2ML IJ SOLN
4.0000 mg | Freq: Once | INTRAMUSCULAR | Status: AC
  Administered 2020-12-12: 4 mg via INTRAVENOUS
  Filled 2020-12-12: qty 2

## 2020-12-12 MED ORDER — LACTATED RINGERS IV SOLN: INTRAVENOUS | Status: AC

## 2020-12-12 MED ORDER — MENTHOL 3 MG MT LOZG
1.0000 | LOZENGE | OROMUCOSAL | Status: DC | PRN
  Filled 2020-12-12: qty 9

## 2020-12-12 MED ORDER — SODIUM CHLORIDE 0.9 % IV BOLUS (SEPSIS)
1000.0000 mL | Freq: Once | INTRAVENOUS | Status: AC
  Administered 2020-12-12: 1000 mL via INTRAVENOUS

## 2020-12-12 MED ORDER — LIP MEDEX EX OINT
1.0000 "application " | TOPICAL_OINTMENT | Freq: Two times a day (BID) | CUTANEOUS | Status: DC
  Administered 2020-12-12 – 2020-12-24 (×25): 1 via TOPICAL
  Filled 2020-12-12 (×5): qty 7

## 2020-12-12 MED ORDER — ONDANSETRON HCL 4 MG/2ML IJ SOLN
4.0000 mg | Freq: Four times a day (QID) | INTRAMUSCULAR | Status: DC | PRN
  Administered 2020-12-12 – 2020-12-14 (×6): 4 mg via INTRAVENOUS
  Filled 2020-12-12 (×7): qty 2

## 2020-12-12 MED ORDER — MAGIC MOUTHWASH
15.0000 mL | Freq: Four times a day (QID) | ORAL | Status: DC | PRN
  Filled 2020-12-12: qty 15

## 2020-12-12 MED ORDER — SODIUM CHLORIDE 0.9% FLUSH
3.0000 mL | Freq: Two times a day (BID) | INTRAVENOUS | Status: DC
  Administered 2020-12-12 – 2020-12-25 (×26): 3 mL via INTRAVENOUS

## 2020-12-12 MED ORDER — ALUM & MAG HYDROXIDE-SIMETH 200-200-20 MG/5ML PO SUSP: 30.0000 mL | Freq: Four times a day (QID) | ORAL | Status: DC | PRN

## 2020-12-12 MED ORDER — HYDRALAZINE HCL 20 MG/ML IJ SOLN
5.0000 mg | Freq: Once | INTRAMUSCULAR | Status: DC
  Filled 2020-12-12: qty 1

## 2020-12-12 MED ORDER — HYDROXYZINE HCL 10 MG PO TABS
10.0000 mg | ORAL_TABLET | Freq: Once | ORAL | Status: DC
  Filled 2020-12-12: qty 1

## 2020-12-12 MED ORDER — DIATRIZOATE MEGLUMINE & SODIUM 66-10 % PO SOLN
90.0000 mL | Freq: Once | ORAL | Status: DC
  Filled 2020-12-12: qty 90

## 2020-12-12 MED ORDER — LACTATED RINGERS IV BOLUS
1000.0000 mL | Freq: Three times a day (TID) | INTRAVENOUS | Status: AC | PRN
  Administered 2020-12-13: 1000 mL via INTRAVENOUS

## 2020-12-12 MED ORDER — HYDROMORPHONE HCL 1 MG/ML IJ SOLN
0.5000 mg | INTRAMUSCULAR | Status: DC | PRN
  Administered 2020-12-12 – 2020-12-22 (×36): 0.5 mg via INTRAVENOUS
  Filled 2020-12-12 (×37): qty 0.5

## 2020-12-12 MED ORDER — LORAZEPAM 2 MG/ML IJ SOLN
0.2500 mg | Freq: Once | INTRAMUSCULAR | Status: AC
  Administered 2020-12-12: 0.25 mg via INTRAVENOUS
  Filled 2020-12-12: qty 1

## 2020-12-12 MED ORDER — METHOCARBAMOL 1000 MG/10ML IJ SOLN
1000.0000 mg | Freq: Four times a day (QID) | INTRAVENOUS | Status: DC | PRN
  Administered 2020-12-12 – 2020-12-20 (×6): 1000 mg via INTRAVENOUS
  Filled 2020-12-12: qty 10
  Filled 2020-12-12: qty 1000
  Filled 2020-12-12: qty 10
  Filled 2020-12-12 (×5): qty 1000

## 2020-12-12 MED ORDER — ACETAMINOPHEN 650 MG RE SUPP
650.0000 mg | Freq: Four times a day (QID) | RECTAL | Status: DC | PRN
  Filled 2020-12-12: qty 1

## 2020-12-12 MED ORDER — SODIUM CHLORIDE 0.9 % IV SOLN
1.0000 g | Freq: Once | INTRAVENOUS | Status: AC
  Administered 2020-12-12: 1 g via INTRAVENOUS
  Filled 2020-12-12: qty 10

## 2020-12-12 NOTE — Progress Notes (Signed)
Pt admitted from the ER with SBO. Pt in the ER was not able to tolerate NG tubes placed x 2.. Pt on the floor unable to tolerate the PO Gastrografin due to nausea and vomiting. Both Primary MD and Surgery were made aware

## 2020-12-12 NOTE — ED Notes (Signed)
Pt in bed with eyes closed. Respirations even and unlabored.

## 2020-12-12 NOTE — Consult Note (Signed)
Jesse [name]  05-23-1928 [id number]  CARE TEAM:  PCP: Jesse Lass, MD  Outpatient Care Team: Patient Care Team: Jesse Lass, MD as PCP - General (Family Medicine) Jesse Mocha, MD as PCP - Cardiology (Cardiology) Jesse Rhodes, MD (Inactive) as Attending Physician (Urology) [name], MD as Consulting Physician (Gastroenterology) Rod Can, MD as Consulting Physician (Orthopedic Surgery) Early, [name], MD as Consulting Physician (Vascular Surgery) [name], [name], MD as Consulting Physician (Neurology)  Inpatient Treatment Team: Treatment Team: Attending Provider: [name], MD; Technician: Jesse [name], [place]; Rounding Team: Jesse Knife, MD; Registered Nurse: Jesse Liming, RN; Consulting Physician: Jesse Pace Md, MD; Technician: Jesse [name]   This patient is a 87 y.o.male who presents today for surgical evaluation at the request of Jesse [name].   Chief complaint / Reason for evaluation: SBO  Elderly patient with multiple medical problems.  Hypertension, aortic aneurysm.  Coronary disease with CABG.  Prior AAA repair by Jesse [name] with vascular surgery.  Has a known thoracic aneurysm followed by Jesse [name] and cardiothoracic surgery.  There 4.2-4.5 cm.  Patient does not want any surgical intervention at this time and given his advanced age this is not been pressed by thoracic surgery.  Numerous abdominal surgeries.  Had prior surgery for hernia repair.  Also had bowel obstruction requiring surgery.  History of duodenal ulcer with bleeding requiring endoscopic evaluation repair in 2019 by Jesse [name] with [facility] gastroenterology.  Known hiatal hernia.  On chronic proton pump inhibitors.  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  Has a very large iliac arterial aneurysms.  Followed by vascular surgery.  History of upper GI bleeding from the ulcer site transition to just low-dose aspirin only for anticoagulation at this point.  Has been followed by  Jesse [name] at cardiology  Patient had worsening bloating and distention.  Intermittent crampy abdominal pains and belching.  Has not had a good bowel movement in a while.  Had a small smear yesterday.  Had worsening symptoms.  Progressed to nausea and vomiting last night.  Came to emergency room.  Hydrated.  No peritonitis but distended.  Story and CT scan suspicious for bowel obstruction.  Medical admission and surgical consultation requested.  Patient refuses nasogastric tube at this time, cannot tolerate it.  He is normally has bowel movements every day.  Has felt somewhat obstipated the past 48 hours.  Unfortunately this is happened before.  He is tired and nauseated.  However his pain and nausea are under better control since he has been in the emergency room.   Assessment  Jesse [name]  87 y.o. male       Problem List:  Principal Problem:   SBO (small bowel obstruction) (HCC) Active Problems:   HLD (hyperlipidemia)   Essential hypertension   CAD, ARTERY BYPASS GRAFT   Iliac artery aneurysm, bilateral (HCC)   Aneurysm of abdominal vessel (HCC)   Small bowel obstruction in an elderly fragile patient most likely due to adhesions.  History of prior obstructions  Plan:  IV fluids.  Nausea and pain control.  Small bowel protocol.  Ideally would get a nasogastric tube to better decompress the stomach and give the best shot in resolving this nonoperatively.  Patient felt like he could not breathe and refused NG tube placement.  We will see if he can at least maybe sip on the contrast to get some films to sort this out.  Given his severe aunts patient with numerous abdominal  surgeries and very thinned out abdomen with loss of domain, he is a high risk operative candidate.  There were no hard indications for surgery at this time such as incarceration/strangulation/perforation/shock/peritonitis.  Surgery will follow.  Consider palliative care consult for goals of care given his  severely advanced age.  He is full code but sounds like he is reluctant to do any more surgery.  Vascular surgery notes are following his moderately large iliac and aortic aneurysms and trying to hold off on any more surgeries.  -VTE prophylaxis- SCDs, etc -mobilize as tolerated to help recovery  30 minutes spent in review, evaluation, examination, counseling, and coordination of care.  More than 50% of that time was spent in counseling.  Jesse Hector, MD, FACS, MASCRS  Esophageal, Gastrointestinal & Colorectal Surgery Robotic and Minimally Invasive Surgery [facility] [address] [place], [place] [address] [phone] Fax [phone] Main/Paging  CONTACT INFORMATION: Weekday (9AM-5PM) concerns: Call [facility] main office at [phone] Weeknight (5PM-9AM) or Weekend/Holiday concerns: Check [email or web address] for General Surgery [facility] coverage (Please, do not use SecureChat as it is not reliable communication to operating surgeons for immediate patient care)      12/12/2020      Past Medical History:  Diagnosis Date  . AAA (abdominal aortic aneurysm) ([place])   . Arthritis   . Cancer ([place]) 2010   prostate ( 40 Txs. of radiation treatments)  . Coronary artery disease   . Environmental allergies   . GERD (gastroesophageal reflux disease)   . History of prostate cancer 2010  . HOH (hard of hearing)    wears bilateral hearing aids  . Hyperlipidemia   . Hypertension   . Kidney stone 1968  . Myocardial infarction ([place]) 1992  . Pneumonia    hx of  . RBBB     Past Surgical History:  Procedure Laterality Date  . ABDOMINAL AORTIC ANEURYSM REPAIR  08-13-1993   Dr Cameron Sprang  . CARDIAC CATHETERIZATION    . COLONOSCOPY W/ POLYPECTOMY    . CORONARY ARTERY BYPASS GRAFT  03-01-1991   Jesse [name]  . ESOPHAGOGASTRODUODENOSCOPY (EGD) WITH PROPOFOL N/A 05/04/2018   Procedure: ESOPHAGOGASTRODUODENOSCOPY (EGD) WITH PROPOFOL;  Surgeon: [name], MD;   Location: [facility];  Service: Gastroenterology;  Laterality: N/A;  . EYE SURGERY     Detached retina (Left eye); bilateral cataract removal  . FALSE ANEURYSM REPAIR  05/07/2012   Procedure: REPAIR FALSE ANEURYSM;  Surgeon: Rosetta Posner, MD;  Location: [facility] OR;  Service: Vascular;  Laterality: Left;  Repair of Left Femoral Artery Aneurysm  . HERNIA REPAIR    . HOT HEMOSTASIS N/A 05/04/2018   Procedure: HOT HEMOSTASIS (ARGON PLASMA COAGULATION/BICAP);  Surgeon: [name], MD;  Location: [facility];  Service: Gastroenterology;  Laterality: N/A;  . INTRAMEDULLARY (IM) NAIL INTERTROCHANTERIC Left 04/16/2019   Procedure: INTRAMEDULLARY (IM) NAIL INTERTROCHANTRIC;  Surgeon: Rod Can, MD;  Location: [facility]S;  Service: Orthopedics;  Laterality: Left;  . INTRAMEDULLARY (IM) NAIL INTERTROCHANTERIC Right 06/17/2019   Procedure: RIGHT INTRAMEDULLARY (IM) NAIL INTERTROCHANTRIC;  Surgeon: Rod Can, MD;  Location: [facility];  Service: Orthopedics;  Laterality: Right;  . ORIF PATELLA Right 06/09/2015   Procedure: OPEN REDUCTION INTERNAL (ORIF) FIXATION PATELLA;  Surgeon: Marybelle Killings, MD;  Location: [facility];  Service: Orthopedics;  Laterality: Right;  . repair of bowel obstruction  1999   Dr. Dalbert Batman  . repair of ventral hernia  04-20-1994   P. Cameron Sprang MD  . resection femoral  artery  03/19/2012   Dr. Sherren [name] Early  . SUBMUCOSAL INJECTION  05/04/2018   Procedure: SUBMUCOSAL INJECTION;  Surgeon: [name], MD;  Location: [facility];  Service: Gastroenterology;;  . Jani Files RESECTION OF BLADDER TUMOR N/A 08/18/2019   Procedure: cysto fulgeration clot evacuation;  Surgeon: Cleon Gustin, MD;  Location: [facility];  Service: Urology;  Laterality: N/A;    Social History   Socioeconomic History  . Marital status: Widowed    Spouse name: Not on file  . Number of children: Not on file  . Years of education: Not on file  . Highest education level: Not on file  Occupational History   . Not on file  Tobacco Use  . Smoking status: Former Smoker    Years: 20.00    Types: Cigarettes    Quit date: 07/25/1968    Years since quitting: 52.4  . Smokeless tobacco: Never Used  Vaping Use  . Vaping Use: Never used  Substance and Sexual Activity  . Alcohol use: No  . Drug use: No  . Sexual activity: Not on file  Other Topics Concern  . Not on file  Social History Narrative  . Not on file   Social Determinants of Health   Financial Resource Strain: Not on file  Food Insecurity: Not on file  Transportation Needs: Not on file  Physical Activity: Not on file  Stress: Not on file  Social Connections: Not on file  Intimate Partner Violence: Not on file    Family History  Problem Relation Age of Onset  . Cancer Mother   . Cancer Father   . Varicose Veins Father   . Cancer Sister   . Diabetes Sister   . Heart attack Sister     Current Facility-Administered Medications  Medication Dose Route Frequency Provider Last Rate Last Admin  . acetaminophen (TYLENOL) suppository 650 mg  650 mg Rectal Q6H PRN Jesse Hedge, MD      . alum & mag hydroxide-simeth (MAALOX/MYLANTA) 200-200-20 MG/5ML suspension 30 mL  30 mL Oral Q6H PRN Jesse Hedge, MD      . diatrizoate meglumine-sodium (GASTROGRAFIN) 66-10 % solution 90 mL  90 mL Per NG tube Once Jesse Hedge, MD      . heparin injection 5,000 Units  5,000 Units Subcutaneous Q8H Jesse Hedge, MD   5,000 Units at 12/12/20 0848  . hydrALAZINE (APRESOLINE) injection 5 mg  5 mg Intravenous Once Jesse Hedge, MD      . HYDROmorphone (DILAUDID) injection 0.5 mg  0.5 mg Intravenous Q2H PRN Jesse Hedge, MD      . lactated ringers bolus 1,000 mL  1,000 mL Intravenous Q8H PRN Jesse Hedge, MD      . lactated ringers infusion   Intravenous Continuous Jesse Hedge, MD 75 mL/hr at 12/12/20 0853 New Bag at 12/12/20 0853  . lip balm (CARMEX) ointment 1 application  1 application Topical BID Jesse Hedge, MD   1 application at  [id number]  . magic mouthwash  15 mL Oral QID PRN Jesse Hedge, MD      . menthol-cetylpyridinium (CEPACOL) lozenge 3 mg  1 lozenge Oral PRN Jesse Hedge, MD      . methocarbamol (ROBAXIN) 1,000 mg in dextrose 5 % 100 mL IVPB  1,000 mg Intravenous Q6H PRN Jesse Hedge, MD      . metoprolol tartrate (LOPRESSOR) injection 5 mg  5 mg Intravenous Q6H PRN Jesse Hedge,  MD      . ondansetron (ZOFRAN) tablet 4 mg  4 mg Oral Q6H PRN Jesse Hedge, MD       Or  . ondansetron [facility]) injection 4 mg  4 mg Intravenous Q6H PRN Jesse Hedge, MD      . phenol (CHLORASEPTIC) mouth spray 2 spray  2 spray Mouth/Throat PRN Jesse Hedge, MD      . sodium chloride flush (NS) 0.9 % injection 3 mL  3 mL Intravenous Q12H Jesse Hedge, MD   3 mL at 12/12/20 1011   Current Outpatient Medications  Medication Sig Dispense Refill  . acetaminophen (TYLENOL) 500 MG tablet Take 500 mg by mouth every 6 (six) hours as needed for mild pain or headache.    . alendronate (FOSAMAX) 70 MG tablet Take 70 mg by mouth once a week. Saturday    . aspirin 81 MG tablet Take 1 tablet (81 mg total) by mouth daily. 30 tablet   . carboxymethylcellulose (REFRESH PLUS) 0.5 % SOLN Apply 2 drops to eye at bedtime.     . carvedilol (COREG) 12.5 MG tablet TAKE 1 TABLET BY MOUTH 2 TIMES DAILY WITH A MEAL (Patient taking differently: Take 12.5 mg by mouth 2 (two) times daily with a meal. ) 180 tablet 1  . Cyanocobalamin (B-12 PO) Take 1,000 mcg by mouth daily.     Marland Kitchen docusate sodium (COLACE) 100 MG capsule Take 2 capsules (200 mg total) by mouth 2 (two) times daily. 10 capsule 0  . fenofibrate 160 MG tablet Take 1 tablet (160 mg total) by mouth daily. 90 tablet 1  . ferrous sulfate 325 (65 FE) MG tablet Take 325 mg by mouth daily with breakfast.    . finasteride (PROSCAR) 5 MG tablet Take 5 mg by mouth daily.    Marland Kitchen loratadine (CLARITIN) 10 MG tablet Take 10 mg by mouth daily.     . Melatonin 3 MG TABS Take 3 mg by mouth at bedtime  as needed (sleep).    . pantoprazole (PROTONIX) 40 MG tablet Take 40 mg by mouth daily.    . sertraline (ZOLOFT) 25 MG tablet Take 25 mg by mouth daily.    . sertraline (ZOLOFT) 50 MG tablet Take 50 mg by mouth daily.    . simvastatin (ZOCOR) 40 MG tablet Take 1 tablet (40 mg total) by mouth daily at 6 PM. 90 tablet 1  . sodium chloride (OCEAN) 0.65 % SOLN nasal spray Place 2 sprays into both nostrils 2 (two) times daily.    Marland Kitchen spironolactone (ALDACTONE) 25 MG tablet Take 12.5 mg by mouth daily.    . tamsulosin (FLOMAX) 0.4 MG CAPS capsule Take 0.4 mg by mouth daily.       Allergies  Allergen Reactions  . Quinolones Other (See Comments)    unknown  . Adhesive [Tape] Itching and Rash    ROS:   All other systems reviewed & are negative except per HPI or as noted below: Constitutional:  No fevers, chills, sweats.  Weight stable Eyes:  No vision changes, No discharge HENT:  No sore throats, nasal drainage Lymph: No neck swelling, No bruising easily Pulmonary:  No cough, productive sputum CV: No orthopnea, PND  Patient walks 10 minutes for about 1/4 miles without difficulty.  No exertional chest/neck/shoulder/arm pain. GI: No personal nor family history of GI/colon cancer, inflammatory bowel disease, irritable bowel syndrome, allergy such as Celiac Sprue, dietary/dairy problems, colitis, ulcers nor gastritis.  No recent sick contacts/gastroenteritis.  No travel outside the country.  No changes in diet. Renal: No UTIs, No hematuria Genital:  No drainage, bleeding, masses Musculoskeletal: No severe joint pain.  Good ROM major joints Skin:  No sores or lesions.  No rashes Heme/Lymph:  No easy bleeding.  No swollen lymph nodes Neuro: No focal weakness/numbness.  No seizures Psych: No suicidal ideation.  No hallucinations  BP 128/81   Pulse 63   Temp 97.8 F (36.6 C)   Resp 16   Ht 6' (1.829 m)   Wt 79.4 kg   SpO2 93%   BMI 23.73 kg/m   Physical Exam: Constitutional: Not cachectic.   Hygeine adequate.  Vitals signs as above.  Initially resting but awakens.  He is tired but not toxic nor sickly. Eyes: Pupils reactive, normal extraocular movements. Sclera nonicteric Neuro: CN II-XII intact.  No major focal sensory defects.  No major motor deficits. Lymph: No head/neck/groin lymphadenopathy Psych:  No severe agitation.  No severe anxiety.  Judgment & insight Adequate, Oriented x4, HENT: Normocephalic, Mucus membranes moist.  No thrush.  Mild hardness of hearing. Neck: Supple, No tracheal deviation.  No obvious thyromegaly Chest: No pain to chest wall compression.  Good respiratory excursion.  No audible wheezing CV:  Pulses intact.  Regular rhythm.  No major extremity edema Abdomen:  Soft.  Mildly distended.  Mild tenderness to palpation.  Small bowel can be easily felt through the skin consistent with significant loss of domain.  However there is no evidence of any ischemia being green or peritonitis.No hepatomegaly.  No splenomegaly Gen:  No inguinal hernias.  No inguinal lymphadenopathy.   Ext: No obvious deformity or contracture no significant edema.  No cyanosis Skin: No major subcutaneous nodules.  Warm and dry Musculoskeletal: Severe joint rigidity not present.  No obvious clubbing.  No digital petechiae.     Results:   Labs: Results for orders placed or performed during the hospital encounter of 12/12/20 (from the past 48 hour(s))  Lipase, blood     Status: None   Collection Time: 12/12/20  3:38 AM  Result Value Ref Range   Lipase 29 11 - 51 U/L    Comment: Performed at [facility], [place] [address] [place], [place] [address]  Comprehensive metabolic panel     Status: Abnormal   Collection Time: 12/12/20  3:38 AM  Result Value Ref Range   Sodium 136 135 - 145 mmol/L   Potassium 4.2 3.5 - 5.1 mmol/L   Chloride 105 98 - 111 mmol/L   CO2 24 22 - 32 mmol/L   Glucose, Bld 122 (H) 70 - 99 mg/dL    Comment: Glucose reference range applies only to  samples taken after fasting for at least 8 hours.   BUN 28 (H) 8 - 23 mg/dL   Creatinine, Ser 0.88 0.61 - 1.24 mg/dL   Calcium 9.6 8.9 - 10.3 mg/dL   Total Protein 7.0 6.5 - 8.1 g/dL   Albumin 4.1 3.5 - 5.0 g/dL   AST 15 15 - 41 U/L   ALT 14 0 - 44 U/L   Alkaline Phosphatase 38 38 - 126 U/L   Total Bilirubin 1.1 0.3 - 1.2 mg/dL   GFR, Estimated >60 >60 mL/min    Comment: (NOTE) Calculated using the CKD-EPI Creatinine Equation (2021)    Anion gap 7 5 - 15    Comment: Performed at [facility], [place] [address] [place], [place] [address]  CBC     Status:  Abnormal   Collection Time: 12/12/20  3:38 AM  Result Value Ref Range   WBC 8.6 4.0 - 10.5 K/uL   RBC 4.39 4.22 - 5.81 MIL/uL   Hemoglobin 12.9 (L) 13.0 - 17.0 g/dL   HCT 40.5 39.0 - 52.0 %   MCV 92.3 80.0 - 100.0 fL   MCH 29.4 26.0 - 34.0 pg   MCHC 31.9 30.0 - 36.0 g/dL   RDW 13.5 11.5 - 15.5 %   Platelets 232 150 - 400 K/uL   nRBC 0.0 0.0 - 0.2 %    Comment: Performed at [facility], [place] [address], [place], [place] [address]  Urinalysis, Routine w reflex microscopic Urine, Clean Catch     Status: Abnormal   Collection Time: 12/12/20  3:38 AM  Result Value Ref Range   Color, Urine AMBER (A) YELLOW    Comment: BIOCHEMICALS MAY BE AFFECTED BY COLOR   APPearance HAZY (A) CLEAR   Specific Gravity, Urine 1.025 1.005 - 1.030   pH 6.0 5.0 - 8.0   Glucose, UA NEGATIVE NEGATIVE mg/dL   Hgb urine dipstick LARGE (A) NEGATIVE   Bilirubin Urine NEGATIVE NEGATIVE   Ketones, ur 5 (A) NEGATIVE mg/dL   Protein, ur 100 (A) NEGATIVE mg/dL   Nitrite NEGATIVE NEGATIVE   Leukocytes,Ua MODERATE (A) NEGATIVE   RBC / HPF >50 (H) 0 - 5 RBC/hpf   WBC, UA 21-50 0 - 5 WBC/hpf   Bacteria, UA NONE SEEN NONE SEEN   Mucus PRESENT     Comment: Performed at [facility], [place] [address] [place], [place] [address]    Imaging / Studies: CT ABDOMEN PELVIS WO CONTRAST  Result Date:  12/12/2020 CLINICAL DATA:  87 year old male with abdominal distension, increased left side abdominal pain and nausea since 2000 hours. EXAM: CT ABDOMEN AND PELVIS WITHOUT CONTRAST TECHNIQUE: Multidetector CT imaging of the abdomen and pelvis was performed following the standard protocol without IV contrast. COMPARISON:  CT Abdomen and Pelvis 08/15/2019 and earlier. FINDINGS: Lower chest: Mild cardiomegaly. Evidence of centrilobular emphysema. Lung base scarring and atelectasis is stable from last year. Hepatobiliary: Chronic cholelithiasis. No CT evidence of acute cholecystitis. Negative noncontrast liver. Pancreas: Atrophied, negative. Spleen: Negative. Adrenals/Urinary Tract: Normal adrenal glands. Stable nonobstructed kidneys. Occasional renal cysts. Renal vascular calcifications. Diminutive urinary bladder mildly displaced anteriorly from internal iliac artery aneurysms detailed below. Stomach/Bowel: Chronic mass effect on the sigmoid colon from massive internal iliac artery aneurysms (see below), series 2, image 69 has increased since 2019. But the upstream sigmoid colon is nondilated. Redundant sigmoid with diverticulosis incidentally noted. Mild retained stool in the rectum. Mild retained stool through the more proximal colon with normal appendix on series 2, image 48. No large bowel inflammation. Negative terminal ileum. Fluid-filled small bowel loops in the abdomen are at the upper limits of normal to mildly dilated, and there is a relatively abrupt transition along the ventral abdominal wall seen on series 2, image 48 and coronal image 34. No free air. No free fluid. Incidental duodenal diverticulum with no active inflammation. Stomach and duodenum not significantly dilated. Vascular/Lymphatic: Chronic Calcified aortic atherosclerosis. And repair of abdominal aortic aneurysm appears stable since 2019. Massive bilateral internal iliac artery aneurysms chronic but enlarged since 2019 (series 2, image 61 and  coronal image 119 up to 82 x 95 mm now on the right side, versus 72 x 80 mm in 2019). Vascular patency is not evaluated in the absence of IV contrast. No lymphadenopathy. Reproductive: Negative.  Other: No pelvic free fluid. Musculoskeletal: Osteopenia. Stable visualized osseous structures. Previous proximal femur ORIF. IMPRESSION: 1. Early or partial Small Bowel Obstruction suspected with a transition point along the ventral abdominal wall, likely due to adhesions in the setting of prior abdominal surgery. No free air or free fluid. 2. Massive bilateral internal iliac artery aneurysms are chronic but enlarged since 2019, now up to 82 x 95 mm on the right (versus 72 x 80 mm in 2019). Subsequent increased mass effect on the sigmoid colon as it traverses the mid pelvis, but no large bowel obstruction at this time. 3. Superimposed prior abdominal aortic aneurysm repair appears stable. Aortic Atherosclerosis (ICD10-I70.0). 4. Chronic cholelithiasis. 5. Emphysema (ICD10-J43.9).  Mild cardiomegaly. Electronically Signed   By: Genevie Ann M.D.   On: 12/12/2020 06:59    Medications / Allergies: per chart  Antibiotics: Anti-infectives (From admission, onward)   Start     Dose/Rate Route Frequency Ordered Stop   12/12/20 0715  cefTRIAXone (ROCEPHIN) 1 g in sodium chloride 0.9 % 100 mL IVPB        1 g 200 mL/hr over 30 Minutes Intravenous  Once 12/12/20 0706 12/12/20 0841        Note: Portions of this report may have been transcribed using voice recognition software. Every effort was made to ensure accuracy; however, inadvertent computerized transcription errors may be present.   Any transcriptional errors that result from this process are unintentional.    Jesse Hector, MD, FACS, MASCRS  Esophageal, Gastrointestinal & Colorectal Surgery Robotic and Minimally Invasive Surgery [facility] [address], [place], [place] [address] [phone] Fax [phone]  Main/Paging  CONTACT INFORMATION: Weekday (9AM-5PM) concerns: Call [facility] main office at [phone] Weeknight (5PM-9AM) or Weekend/Holiday concerns: Check [email or web address] for General Surgery [facility] coverage (Please, do not use SecureChat as it is not reliable communication to operating surgeons for immediate patient care)      12/12/2020  12:16 PM

## 2020-12-12 NOTE — ED Notes (Signed)
Pt not able to tolerate NG tube RN Ronalee Belts attempted and pt pulled it out the 2nd time. Provider ordered meds for anxiety. Pt states at this time he does not want the NG tube.

## 2020-12-12 NOTE — Progress Notes (Signed)
Patient assessed and stated feeling anxious after today's events with NGT. Oncall provider made aware.

## 2020-12-12 NOTE — ED Notes (Signed)
Patient transported to CT 

## 2020-12-12 NOTE — ED Provider Notes (Signed)
[facility] DEPT Provider Note   CSN: [id number] Arrival date & time: 12/12/20  5366     History Chief Complaint  Patient presents with  . Abdominal Pain  . Nausea  . Emesis    Jesse [name] is a 87 y.o. male.  The history is provided by the patient and a relative.  Abdominal Pain Pain location: Lower abdominal pain. Pain quality: aching   Pain radiates to:  Does not radiate Pain severity:  Moderate Onset quality:  Gradual Duration:  8 hours Timing:  Intermittent Progression:  Worsening Chronicity:  New Relieved by:  Nothing Worsened by:  Movement and palpation Associated symptoms: nausea and vomiting   Associated symptoms: no chest pain, no constipation, no diarrhea, no dysuria, no fever and no shortness of breath   Risk factors: multiple surgeries   Emesis Associated symptoms: abdominal pain   Associated symptoms: no diarrhea and no fever    Patient with extensive history including thoracic aneurysm, hypertension, hyperlipidemia, CAD presents with lower abdominal pain.  Patient reports over the past 8 hours has had increasing/worsening lower abdominal pain.  Patient reports distant history of hernia repair with mesh and it is around that area in his abdomen.  No fevers but he does report nausea and vomiting He had otherwise been at his baseline prior to the pain starting    Past Medical History:  Diagnosis Date  . AAA (abdominal aortic aneurysm) ([place])   . Arthritis   . Cancer ([place]) 2010   prostate ( 40 Txs. of radiation treatments)  . Coronary artery disease   . Environmental allergies   . GERD (gastroesophageal reflux disease)   . History of prostate cancer 2010  . HOH (hard of hearing)    wears bilateral hearing aids  . Hyperlipidemia   . Hypertension   . Kidney stone 1968  . Myocardial infarction ([place]) 1992  . Pneumonia    hx of  . RBBB     Patient Active Problem List   Diagnosis Date Noted  . Abducens (sixth) nerve  palsy, left 02/20/2020  . Binocular vision disorder with diplopia 02/20/2020  . Gross hematuria 08/15/2019  . History of prostate cancer 08/15/2019  . Anxiety 06/21/2019  . Anemia due to chronic illness 06/20/2019  . Pneumonia due to COVID-19 virus 06/20/2019  . Displaced spiral fracture of shaft of right femur, initial encounter for open fracture type I or II ([place]) 06/20/2019  . COVID-19 virus infection 06/19/2019  . Surgery, elective   . Hip fracture ([place]) 04/15/2019  . Fall   . Coronary artery disease involving native coronary artery of native heart without angina pectoris 11/20/2018  . Thoracic aortic aneurysm without rupture ([place]) 11/20/2018  . Chronic systolic heart failure ([place]) 11/20/2018  . Duodenal ulcer with hemorrhage 05/05/2018  . Hemorrhagic shock ([place]) 05/05/2018  . AKI (acute kidney injury) ([place]) 05/05/2018  . Orthostatic hypotension 05/04/2018  . Symptomatic anemia 05/04/2018  . Melena 05/04/2018  . GI bleed 05/04/2018  . Patella fracture 06/09/2015  . Aftercare following surgery of the circulatory system, [place] 01/08/2013  . Pain in limb- Left popliteal 12/25/2012  . 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 Aneurysm of abdominal vessel ([place]) 02/21/2012  . Aneurysm of iliac artery ([facility]) 08/09/2011  . Aneurysm of artery of lower extremity ([place]) 08/09/2011  . HLD (hyperlipidemia) 01/16/2009  . Essential hypertension 01/16/2009  . CAD, ARTERY BYPASS GRAFT 01/16/2009  . PERIPHERAL VASCULAR DISEASE 01/16/2009  .  ABDOMINAL AORTIC ANEURYSM REPAIR, HX OF 01/16/2009  . MIXED HYPERLIPIDEMIA 12/28/2008    Past Surgical History:  Procedure Laterality Date  . ABDOMINAL AORTIC ANEURYSM REPAIR  08-13-1993   Dr Cameron Sprang  . CARDIAC CATHETERIZATION    . COLONOSCOPY W/ POLYPECTOMY    . CORONARY ARTERY BYPASS GRAFT  03-01-1991   Dr. Servando Snare  . ESOPHAGOGASTRODUODENOSCOPY (EGD) WITH PROPOFOL N/A 05/04/2018   Procedure:  ESOPHAGOGASTRODUODENOSCOPY (EGD) WITH PROPOFOL;  Surgeon: [name], MD;  Location: [facility]COPY;  Service: Gastroenterology;  Laterality: N/A;  . EYE SURGERY     Detached retina (Left eye); bilateral cataract removal  . FALSE ANEURYSM REPAIR  05/07/2012   Procedure: REPAIR FALSE ANEURYSM;  Surgeon: Rosetta Posner, MD;  Location: [facility] OR;  Service: Vascular;  Laterality: Left;  Repair of Left Femoral Artery Aneurysm  . HERNIA REPAIR    . HOT HEMOSTASIS N/A 05/04/2018   Procedure: HOT HEMOSTASIS (ARGON PLASMA COAGULATION/BICAP);  Surgeon: [name], MD;  Location: [facility];  Service: Gastroenterology;  Laterality: N/A;  . INTRAMEDULLARY (IM) NAIL INTERTROCHANTERIC Left 04/16/2019   Procedure: INTRAMEDULLARY (IM) NAIL INTERTROCHANTRIC;  Surgeon: Rod Can, MD;  Location: [facility] ORS;  Service: Orthopedics;  Laterality: Left;  . INTRAMEDULLARY (IM) NAIL INTERTROCHANTERIC Right 06/17/2019   Procedure: RIGHT INTRAMEDULLARY (IM) NAIL INTERTROCHANTRIC;  Surgeon: Rod Can, MD;  Location: [facility];  Service: Orthopedics;  Laterality: Right;  . ORIF PATELLA Right 06/09/2015   Procedure: OPEN REDUCTION INTERNAL (ORIF) FIXATION PATELLA;  Surgeon: Marybelle Killings, MD;  Location: [facility];  Service: Orthopedics;  Laterality: Right;  . repair of bowel obstruction  1999   Dr. Dalbert Batman  . repair of ventral hernia  04-20-1994   P. Cameron Sprang MD  . resection femoral artery  03/19/2012   Dr. Curt Jews  . SUBMUCOSAL INJECTION  05/04/2018   Procedure: SUBMUCOSAL INJECTION;  Surgeon: [name], MD;  Location: [facility];  Service: Gastroenterology;;  . Jani Files RESECTION OF BLADDER TUMOR N/A 08/18/2019   Procedure: cysto fulgeration clot evacuation;  Surgeon: Cleon Gustin, MD;  Location: [facility] OR[facility];  Service: Urology;  Laterality: N/A;       Family History  Problem Relation Age of Onset  . Cancer Mother   . Cancer Father   . Varicose Veins Father   . Cancer Sister   . Diabetes Sister   .  Heart attack Sister     Social History   Tobacco Use  . Smoking status: Former Smoker    Years: 20.00    Types: Cigarettes    Quit date: 07/25/1968    Years since quitting: 52.4  . Smokeless tobacco: Never Used  Vaping Use  . Vaping Use: Never used  Substance Use Topics  . Alcohol use: No  . Drug use: No    Home Medications Prior to Admission medications   Medication Sig Start Date End Date Taking? Authorizing Provider  acetaminophen (TYLENOL) 500 MG tablet Take 500 mg by mouth every 6 (six) hours as needed for mild pain or headache.    [provider]  alendronate (FOSAMAX) 70 MG tablet Take 70 mg by mouth once a week. Saturday 08/14/19   [provider]  aspirin 81 MG tablet Take 1 tablet (81 mg total) by mouth daily. 08/26/19   Terrilee Croak, MD  carboxymethylcellulose (REFRESH PLUS) 0.5 % SOLN Apply 2 drops to eye at bedtime.     [provider]  carvedilol (COREG) 12.5 MG tablet TAKE 1 TABLET BY MOUTH 2 TIMES DAILY  WITH A MEAL Patient taking differently: Take 12.5 mg by mouth 2 (two) times daily with a meal.  05/29/19   [name], MD  Cyanocobalamin (B-12 PO) Take 1,000 mcg by mouth daily.     [provider]  docusate sodium (COLACE) 100 MG capsule Take 2 capsules (200 mg total) by mouth 2 (two) times daily. 04/20/19   Georgette Shell, MD  fenofibrate 160 MG tablet Take 1 tablet (160 mg total) by mouth daily. 05/29/19   [name], MD  ferrous sulfate 325 (65 FE) MG tablet Take 325 mg by mouth daily with breakfast.    [provider]  finasteride (PROSCAR) 5 MG tablet Take 5 mg by mouth daily. 02/10/20   [provider]  loratadine (CLARITIN) 10 MG tablet Take 10 mg by mouth daily.     [provider]  Melatonin 3 MG TABS Take 3 mg by mouth at bedtime as needed (sleep).    [provider]  pantoprazole (PROTONIX) 40 MG tablet Take 40 mg by mouth daily.    [provider]  pyridostigmine  (MESTINON) 60 MG tablet Take 0.5 tablets (30 mg total) by mouth daily after breakfast. 03/19/20   [name], [name], MD  sertraline (ZOLOFT) 25 MG tablet Take 25 mg by mouth daily. 03/13/20   [provider]  simvastatin (ZOCOR) 40 MG tablet Take 1 tablet (40 mg total) by mouth daily at 6 PM. 05/29/19   [name], MD  sodium chloride (OCEAN) 0.65 % SOLN nasal spray Place 2 sprays into both nostrils 2 (two) times daily.    [provider]  tamsulosin (FLOMAX) 0.4 MG CAPS capsule Take 0.4 mg by mouth daily. 02/10/20   [provider]    Allergies    Quinolones and Adhesive [tape]  Review of Systems   Review of Systems  Constitutional: Negative for fever.  Respiratory: Negative for shortness of breath.   Cardiovascular: Negative for chest pain.  Gastrointestinal: Positive for abdominal pain, nausea and vomiting. Negative for constipation and diarrhea.  Genitourinary: Negative for dysuria.  All other systems reviewed and are negative.   Physical Exam Updated Vital Signs BP (!) 137/93   Pulse (!) 55   Temp 97.7 F (36.5 C) (Oral)   Resp 13   Ht 1.829 m (6')   Wt 79.4 kg   SpO2 99%   BMI 23.73 kg/m   Physical Exam CONSTITUTIONAL: Elderly, no acute distress HEAD: Normocephalic/atraumatic EYES: EOMI/PERRL ENMT: Mucous membranes moist NECK: supple no meningeal signs SPINE/BACK:entire spine nontender CV: S1/S2 noted LUNGS: Lungs are clear to auscultation bilaterally, no apparent distress ABDOMEN: soft, distended.  Multiple well-healed scars noted throughout his abdomen.  Patient with significant tenderness just below the umbilicus.  There is no large hernia noted. GU:no cva tenderness NEURO: Pt is awake/alert/appropriate, moves all extremitiesx4.  No facial droop.   EXTREMITIES: pulses normal/equal, full ROM SKIN: warm, color normal PSYCH: no abnormalities of mood noted, alert and oriented to situation  ED Results / Procedures / Treatments    Labs (all labs ordered are listed, but only abnormal results are displayed) Labs Reviewed  COMPREHENSIVE METABOLIC PANEL - Abnormal; Notable for the following components:      Result Value   Glucose, Bld 122 (*)    BUN 28 (*)    All other components within normal limits  CBC - Abnormal; Notable for the following components:   Hemoglobin 12.9 (*)    All other components within normal limits  URINALYSIS, ROUTINE W  REFLEX MICROSCOPIC - Abnormal; Notable for the following components:   Color, Urine AMBER (*)    APPearance HAZY (*)    Hgb urine dipstick LARGE (*)    Ketones, ur 5 (*)    Protein, ur 100 (*)    Leukocytes,Ua MODERATE (*)    RBC / HPF >50 (*)    All other components within normal limits  URINE CULTURE  SARS CORONAVIRUS 2 (TAT 6-24 HRS)  LIPASE, BLOOD    EKG EKG Interpretation  Date/Time:  Saturday Dec 12 2020 04:02:54 EDT Ventricular Rate:  54 PR Interval:  223 QRS Duration: 133 QT Interval:  486 QTC Calculation: 461 R Axis:   -68 Text Interpretation: Sinus rhythm Prolonged PR interval Confirmed by Ripley Fraise [phone]) on 12/12/2020 4:48:59 AM   Radiology CT ABDOMEN PELVIS WO CONTRAST  Result Date: 12/12/2020 CLINICAL DATA:  87 year old male with abdominal distension, increased left side abdominal pain and nausea since 2000 hours. EXAM: CT ABDOMEN AND PELVIS WITHOUT CONTRAST TECHNIQUE: Multidetector CT imaging of the abdomen and pelvis was performed following the standard protocol without IV contrast. COMPARISON:  CT Abdomen and Pelvis 08/15/2019 and earlier. FINDINGS: Lower chest: Mild cardiomegaly. Evidence of centrilobular emphysema. Lung base scarring and atelectasis is stable from last year. Hepatobiliary: Chronic cholelithiasis. No CT evidence of acute cholecystitis. Negative noncontrast liver. Pancreas: Atrophied, negative. Spleen: Negative. Adrenals/Urinary Tract: Normal adrenal glands. Stable nonobstructed kidneys. Occasional renal cysts. Renal vascular  calcifications. Diminutive urinary bladder mildly displaced anteriorly from internal iliac artery aneurysms detailed below. Stomach/Bowel: Chronic mass effect on the sigmoid colon from massive internal iliac artery aneurysms (see below), series 2, image 69 has increased since 2019. But the upstream sigmoid colon is nondilated. Redundant sigmoid with diverticulosis incidentally noted. Mild retained stool in the rectum. Mild retained stool through the more proximal colon with normal appendix on series 2, image 48. No large bowel inflammation. Negative terminal ileum. Fluid-filled small bowel loops in the abdomen are at the upper limits of normal to mildly dilated, and there is a relatively abrupt transition along the ventral abdominal wall seen on series 2, image 48 and coronal image 34. No free air. No free fluid. Incidental duodenal diverticulum with no active inflammation. Stomach and duodenum not significantly dilated. Vascular/Lymphatic: Chronic Calcified aortic atherosclerosis. And repair of abdominal aortic aneurysm appears stable since 2019. Massive bilateral internal iliac artery aneurysms chronic but enlarged since 2019 (series 2, image 61 and coronal image 119 up to 82 x 95 mm now on the right side, versus 72 x 80 mm in 2019). Vascular patency is not evaluated in the absence of IV contrast. No lymphadenopathy. Reproductive: Negative. Other: No pelvic free fluid. Musculoskeletal: Osteopenia. Stable visualized osseous structures. Previous proximal femur ORIF. IMPRESSION: 1. Early or partial Small Bowel Obstruction suspected with a transition point along the ventral abdominal wall, likely due to adhesions in the setting of prior abdominal surgery. No free air or free fluid. 2. Massive bilateral internal iliac artery aneurysms are chronic but enlarged since 2019, now up to 82 x 95 mm on the right (versus 72 x 80 mm in 2019). Subsequent increased mass effect on the sigmoid colon as it traverses the mid pelvis,  but no large bowel obstruction at this time. 3. Superimposed prior abdominal aortic aneurysm repair appears stable. Aortic Atherosclerosis (ICD10-I70.0). 4. Chronic cholelithiasis. 5. Emphysema (ICD10-J43.9).  Mild cardiomegaly. Electronically Signed   By: Genevie Ann M.D.   On: 12/12/2020 06:59    Procedures Procedures   Medications Ordered in  ED Medications  cefTRIAXone (ROCEPHIN) 1 g in sodium chloride 0.9 % 100 mL IVPB (has no administration in time range)  fentaNYL (SUBLIMAZE) injection 50 mcg (has no administration in time range)  ondansetron (ZOFRAN) injection 4 mg (has no administration in time range)  ondansetron (ZOFRAN) injection 4 mg (4 mg Intravenous Given 12/12/20 0506)  sodium chloride 0.9 % bolus 1,000 mL (0 mLs Intravenous Stopped 12/12/20 0623)  fentaNYL (SUBLIMAZE) injection 50 mcg (50 mcg Intravenous Given 12/12/20 0507)    ED Course  I have reviewed the triage vital signs and the nursing notes.  Pertinent labs & imaging results that were available during my care of the patient were reviewed by me and considered in my medical decision making (see chart for details).    MDM Rules/Calculators/A&P                          5:31 AM Patient with extensive history and surgical history presenting with lower abdominal pain.  Patient is high risk for small bowel obstruction or other acute abdominal emergency. Patient will need CT imaging 7:17 AM CT imaging reveals early small bowel obstruction.  Patient also has chronic vascular findings without significant changes.  Patient also noted to have UTI.  Patient reports his pain is returning. Discussed with hospitalist for admission.  Patient was updated on plan.  I attempted to call his son at his request, but he did not answer  Final Clinical Impression(s) / ED Diagnoses Final diagnoses:  Small bowel obstruction ([place])  Acute cystitis with hematuria    Rx / DC Orders ED Discharge Orders    None       Ripley Fraise,  MD 12/12/20 [phone]

## 2020-12-12 NOTE — ED Triage Notes (Signed)
Patient here from home reporting abd pain increased on left side with nausea and dry heaving that started around 8pm.

## 2020-12-12 NOTE — H&P (Addendum)
History and Physical        Hospital Admission Note Date: 12/12/2020  Patient name: Jesse [name] Medical record number: [id number] Date of birth: 05-Dec-1927 Age: 87 y.o. Gender: male  PCP: [name], MD  Patient coming from: home Lives with: son At baseline, ambulates: can  Chief Complaint    Chief Complaint  Patient presents with  . Abdominal Pain  . Nausea  . Emesis      HPI:   This is a 87 year old male with past medical history of hypertension, hyperlipidemia, RBBB, CAD s/p CABG, AAA s/p repair and s/p bilateral femoral aneurysm repair in '95, thoracic aneurysm, GI bleed, prostate cancer s/p XRT, bladder tumor s/p resection, distant repair of bowel obstruction, hernia repair with mesh who presented to the ED from home with lower abdominal pain, nausea and dry heaves since 8 PM last night.  Currently admits to bloating, abdominal distention and intermittent abdominal pain from spasms and belching but no vomiting.  Last BM was yesterday but small.  He denies fever, chills, dysuria, hematuria or changes in urination.  No other symptoms.   ED Course: Afebrile, hemodynamically stable, on room air. Notable Labs: CBC and CMP overall unremarkable, UA-hazy, large Hb, 5 ketones, moderate leukocytes, negative nitrites, no bacteria,> 50 RBC, 21-50 WBC. Notable Imaging: CT abdomen pelvis without contrast-earlier partial SBO suspected with transition point along the ventral abdominal wall likely due to adhesions in the setting of prior abdominal surgery with no free air or free fluid, chronic massive bilateral internal iliac artery aneurysms but enlarged since 2019 with subsequent increased mass-effect on the sigmoid colon as it traverses the mid pelvis but no large bowel obstruction, otherwise chronic issues. Patient received fentanyl, Zofran, 1 L NS bolus, ceftriaxone.    Vitals:    12/12/20 0730 12/12/20 0742  BP: (!) 124/104 (!) 124/104  Pulse: 62 62  Resp:  16  Temp:  97.8 F (36.6 C)  SpO2: 97% 97%     Review of Systems:  Review of Systems  All other systems reviewed and are negative.   Medical/Social/Family History   Past Medical History: Past Medical History:  Diagnosis Date  . AAA (abdominal aortic aneurysm) ([place])   . Arthritis   . Cancer ([place]) 2010   prostate ( 40 Txs. of radiation treatments)  . Coronary artery disease   . Environmental allergies   . GERD (gastroesophageal reflux disease)   . History of prostate cancer 2010  . HOH (hard of hearing)    wears bilateral hearing aids  . Hyperlipidemia   . Hypertension   . Kidney stone 1968  . Myocardial infarction ([place]) 1992  . Pneumonia    hx of  . RBBB     Past Surgical History:  Procedure Laterality Date  . ABDOMINAL AORTIC ANEURYSM REPAIR  08-13-1993   Dr Cameron Sprang  . CARDIAC CATHETERIZATION    . COLONOSCOPY W/ POLYPECTOMY    . CORONARY ARTERY BYPASS GRAFT  03-01-1991   Dr. Servando Snare  . ESOPHAGOGASTRODUODENOSCOPY (EGD) WITH PROPOFOL N/A 05/04/2018   Procedure: ESOPHAGOGASTRODUODENOSCOPY (EGD) WITH PROPOFOL;  Surgeon: [name], MD;  Location: [facility];  Service: Gastroenterology;  Laterality: N/A;  . EYE SURGERY     Detached  retina (Left eye); bilateral cataract removal  . FALSE ANEURYSM REPAIR  05/07/2012   Procedure: REPAIR FALSE ANEURYSM;  Surgeon: Rosetta Posner, MD;  Location: [facility] OR;  Service: Vascular;  Laterality: Left;  Repair of Left Femoral Artery Aneurysm  . HERNIA REPAIR    . HOT HEMOSTASIS N/A 05/04/2018   Procedure: HOT HEMOSTASIS (ARGON PLASMA COAGULATION/BICAP);  Surgeon: [name], MD;  Location: [facility];  Service: Gastroenterology;  Laterality: N/A;  . INTRAMEDULLARY (IM) NAIL INTERTROCHANTERIC Left 04/16/2019   Procedure: INTRAMEDULLARY (IM) NAIL INTERTROCHANTRIC;  Surgeon: Rod Can, MD;  Location: [facility];  Service: Orthopedics;  Laterality:  Left;  . INTRAMEDULLARY (IM) NAIL INTERTROCHANTERIC Right 06/17/2019   Procedure: RIGHT INTRAMEDULLARY (IM) NAIL INTERTROCHANTRIC;  Surgeon: Rod Can, MD;  Location: [facility];  Service: Orthopedics;  Laterality: Right;  . ORIF PATELLA Right 06/09/2015   Procedure: OPEN REDUCTION INTERNAL (ORIF) FIXATION PATELLA;  Surgeon: Marybelle Killings, MD;  Location: [facility];  Service: Orthopedics;  Laterality: Right;  . repair of bowel obstruction  1999   Dr. Dalbert Batman  . repair of ventral hernia  04-20-1994   P. Cameron Sprang MD  . resection femoral artery  03/19/2012   Dr. Curt Jews  . SUBMUCOSAL INJECTION  05/04/2018   Procedure: SUBMUCOSAL INJECTION;  Surgeon: [name], MD;  Location: [facility];  Service: Gastroenterology;;  . Jani Files RESECTION OF BLADDER TUMOR N/A 08/18/2019   Procedure: cysto fulgeration clot evacuation;  Surgeon: Cleon Gustin, MD;  Location: [facility];  Service: Urology;  Laterality: N/A;    Medications: Prior to Admission medications   Medication Sig Start Date End Date Taking? Authorizing Provider  acetaminophen (TYLENOL) 500 MG tablet Take 500 mg by mouth every 6 (six) hours as needed for mild pain or headache.    [provider]  alendronate (FOSAMAX) 70 MG tablet Take 70 mg by mouth once a week. Saturday 08/14/19   [provider]  aspirin 81 MG tablet Take 1 tablet (81 mg total) by mouth daily. 08/26/19   Terrilee Croak, MD  carboxymethylcellulose (REFRESH PLUS) 0.5 % SOLN Apply 2 drops to eye at bedtime.     [provider]  carvedilol (COREG) 12.5 MG tablet TAKE 1 TABLET BY MOUTH 2 TIMES DAILY WITH A MEAL Patient taking differently: Take 12.5 mg by mouth 2 (two) times daily with a meal.  05/29/19   [name], MD  Cyanocobalamin (B-12 PO) Take 1,000 mcg by mouth daily.     [provider]  docusate sodium (COLACE) 100 MG capsule Take 2 capsules (200 mg total) by mouth 2 (two) times daily. 04/20/19   Georgette Shell, MD   fenofibrate 160 MG tablet Take 1 tablet (160 mg total) by mouth daily. 05/29/19   [name], MD  ferrous sulfate 325 (65 FE) MG tablet Take 325 mg by mouth daily with breakfast.    [provider]  finasteride (PROSCAR) 5 MG tablet Take 5 mg by mouth daily. 02/10/20   [provider]  loratadine (CLARITIN) 10 MG tablet Take 10 mg by mouth daily.     [provider]  Melatonin 3 MG TABS Take 3 mg by mouth at bedtime as needed (sleep).    [provider]  pantoprazole (PROTONIX) 40 MG tablet Take 40 mg by mouth daily.    [provider]  pyridostigmine (MESTINON) 60 MG tablet Take 0.5 tablets (30 mg total) by mouth daily after breakfast. 03/19/20   [name], [name], MD  sertraline (ZOLOFT) 25  MG tablet Take 25 mg by mouth daily. 03/13/20   [provider]  simvastatin (ZOCOR) 40 MG tablet Take 1 tablet (40 mg total) by mouth daily at 6 PM. 05/29/19   [name], MD  sodium chloride (OCEAN) 0.65 % SOLN nasal spray Place 2 sprays into both nostrils 2 (two) times daily.    [provider]  tamsulosin (FLOMAX) 0.4 MG CAPS capsule Take 0.4 mg by mouth daily. 02/10/20   [provider]    Allergies:   Allergies  Allergen Reactions  . Quinolones Other (See Comments)    unknown  . Adhesive [Tape] Itching and Rash    Social History:  reports that he quit smoking about 52 years ago. His smoking use included cigarettes. He quit after 20.00 years of use. He has never used smokeless tobacco. He reports that he does not drink alcohol and does not use drugs.  Family History: Family History  Problem Relation Age of Onset  . Cancer Mother   . Cancer Father   . Varicose Veins Father   . Cancer Sister   . Diabetes Sister   . Heart attack Sister      Objective   Physical Exam: Blood pressure (!) 124/104, pulse 62, temperature 97.8 F (36.6 C), resp. rate 16, height 6' (1.829 m), weight 79.4 kg, SpO2 97 %.  Physical  Exam Vitals and nursing note reviewed. Exam conducted with a chaperone present.  Constitutional:      General: He is not in acute distress.    Appearance: Normal appearance.  HENT:     Head: Normocephalic and atraumatic.  Eyes:     Conjunctiva/sclera: Conjunctivae normal.  Cardiovascular:     Rate and Rhythm: Normal rate and regular rhythm.  Pulmonary:     Effort: Pulmonary effort is normal.     Breath sounds: Normal breath sounds.  Abdominal:     General: Bowel sounds are decreased. There is distension.     Tenderness: There is abdominal tenderness in the epigastric area, periumbilical area and left upper quadrant.     Comments: Well-healed abdominal surgical scar  Musculoskeletal:        General: No swelling or tenderness.  Skin:    Coloration: Skin is not jaundiced or pale.  Neurological:     Mental Status: He is alert. Mental status is at baseline.  Psychiatric:        Mood and Affect: Mood normal.        Behavior: Behavior normal.     LABS on Admission: I have personally reviewed all the labs and imaging below    Basic Metabolic Panel: Recent Labs  Lab 12/12/20 0338  NA 136  K 4.2  CL 105  CO2 24  GLUCOSE 122*  BUN 28*  CREATININE 0.88  CALCIUM 9.6   Liver Function Tests: Recent Labs  Lab 12/12/20 0338  AST 15  ALT 14  ALKPHOS 38  BILITOT 1.1  PROT 7.0  ALBUMIN 4.1   Recent Labs  Lab 12/12/20 0338  LIPASE 29   No results for input(s): AMMONIA in the last 168 hours. CBC: Recent Labs  Lab 12/12/20 0338  WBC 8.6  HGB 12.9*  HCT 40.5  MCV 92.3  PLT 232   Cardiac Enzymes: No results for input(s): CKTOTAL, CKMB, CKMBINDEX, TROPONINI in the last 168 hours. BNP: Invalid input(s): POCBNP CBG: No results for input(s): GLUCAP in the last 168 hours.  Radiological Exams on Admission:  CT ABDOMEN PELVIS WO CONTRAST  Result Date:  12/12/2020 CLINICAL DATA:  87 year old male with abdominal distension, increased left side abdominal pain and nausea  since 2000 hours. EXAM: CT ABDOMEN AND PELVIS WITHOUT CONTRAST TECHNIQUE: Multidetector CT imaging of the abdomen and pelvis was performed following the standard protocol without IV contrast. COMPARISON:  CT Abdomen and Pelvis 08/15/2019 and earlier. FINDINGS: Lower chest: Mild cardiomegaly. Evidence of centrilobular emphysema. Lung base scarring and atelectasis is stable from last year. Hepatobiliary: Chronic cholelithiasis. No CT evidence of acute cholecystitis. Negative noncontrast liver. Pancreas: Atrophied, negative. Spleen: Negative. Adrenals/Urinary Tract: Normal adrenal glands. Stable nonobstructed kidneys. Occasional renal cysts. Renal vascular calcifications. Diminutive urinary bladder mildly displaced anteriorly from internal iliac artery aneurysms detailed below. Stomach/Bowel: Chronic mass effect on the sigmoid colon from massive internal iliac artery aneurysms (see below), series 2, image 69 has increased since 2019. But the upstream sigmoid colon is nondilated. Redundant sigmoid with diverticulosis incidentally noted. Mild retained stool in the rectum. Mild retained stool through the more proximal colon with normal appendix on series 2, image 48. No large bowel inflammation. Negative terminal ileum. Fluid-filled small bowel loops in the abdomen are at the upper limits of normal to mildly dilated, and there is a relatively abrupt transition along the ventral abdominal wall seen on series 2, image 48 and coronal image 34. No free air. No free fluid. Incidental duodenal diverticulum with no active inflammation. Stomach and duodenum not significantly dilated. Vascular/Lymphatic: Chronic Calcified aortic atherosclerosis. And repair of abdominal aortic aneurysm appears stable since 2019. Massive bilateral internal iliac artery aneurysms chronic but enlarged since 2019 (series 2, image 61 and coronal image 119 up to 82 x 95 mm now on the right side, versus 72 x 80 mm in 2019). Vascular patency is not  evaluated in the absence of IV contrast. No lymphadenopathy. Reproductive: Negative. Other: No pelvic free fluid. Musculoskeletal: Osteopenia. Stable visualized osseous structures. Previous proximal femur ORIF. IMPRESSION: 1. Early or partial Small Bowel Obstruction suspected with a transition point along the ventral abdominal wall, likely due to adhesions in the setting of prior abdominal surgery. No free air or free fluid. 2. Massive bilateral internal iliac artery aneurysms are chronic but enlarged since 2019, now up to 82 x 95 mm on the right (versus 72 x 80 mm in 2019). Subsequent increased mass effect on the sigmoid colon as it traverses the mid pelvis, but no large bowel obstruction at this time. 3. Superimposed prior abdominal aortic aneurysm repair appears stable. Aortic Atherosclerosis (ICD10-I70.0). 4. Chronic cholelithiasis. 5. Emphysema (ICD10-J43.9).  Mild cardiomegaly. Electronically Signed   By: Genevie Ann M.D.   On: 12/12/2020 06:59      EKG: unchanged from previous tracings, normal sinus rhythm, RBBB, left axis deviation, 1st degree AV block   A & P   Principal Problem:   SBO (small bowel obstruction) ([facility]) Active Problems:   HLD (hyperlipidemia)   Essential hypertension   CAD, ARTERY BYPASS GRAFT   Iliac artery aneurysm, bilateral ([facility])   Aneurysm of abdominal vessel ([place])   1. Small bowel obstruction likely from adhesions with history of multiple abdominal surgeries a. General surgery consulted, discussed with Dr. Johney Maine, plans for SBO protocol and will see in consult b. Did not tolerate NG tube insertion c. N.p.o. and As needed pain regimen d. IV fluids  2. Suspected sterile pyuria, unlikely UTI a. UA without bacteria but with leukocytes and history of bladder tumor resection.  Currently with intra-abdominal process likely causing local inflammation b. Hold further antibiotics for now  3. Chronic  massive bilateral internal iliac artery aneurysms with extensive vascular  history a. Discussed with Dr. Scot Dock, vascular surgery, no intervention warranted this hospitalization but will need close outpatient follow-up  4. CAD s/p CABG  Hyperlipidemia, stable a. Holding home aspirin, carvedilol, statin and fenofibrate     DVT prophylaxis: Heparin   Code Status: Full Code  Diet: N.p.o. Family Communication: Admission, patients condition and plan of care including tests being ordered have been discussed with the patient who indicates understanding and agrees with the plan and Code Status. Patient's son was updated  Disposition Plan: The appropriate patient status for this patient is INPATIENT. Inpatient status is judged to be reasonable and necessary in order to provide the required intensity of service to ensure the patient's safety. The patient's presenting symptoms, physical exam findings, and initial radiographic and laboratory data in the context of their chronic comorbidities is felt to place them at high risk for further clinical deterioration. Furthermore, it is not anticipated that the patient will be medically stable for discharge from the hospital within 2 midnights of admission. The following factors support the patient status of inpatient.   " The patient's presenting symptoms include abdominal pain, nausea. " The worrisome physical exam findings include abdominal pain. " The initial radiographic and laboratory data are worrisome because of SBO. " The chronic co-morbidities include multiple abdominal surgeries.   * I certify that at the point of admission it is my clinical judgment that the patient will require inpatient hospital care spanning beyond 2 midnights from the point of admission due to high intensity of service, high risk for further deterioration and high frequency of surveillance required.*   Consultants  . General surgery  Procedures  . none  Time Spent on Admission: 66 minutes    [name], DO [facility]  12/12/2020, 8:23 AM

## 2020-12-13 DIAGNOSIS — Z7189 Other specified counseling: Secondary | ICD-10-CM

## 2020-12-13 DIAGNOSIS — Z515 Encounter for palliative care: Secondary | ICD-10-CM | POA: Diagnosis not present

## 2020-12-13 DIAGNOSIS — K56609 Unspecified intestinal obstruction, unspecified as to partial versus complete obstruction: Secondary | ICD-10-CM | POA: Diagnosis not present

## 2020-12-13 LAB — BASIC METABOLIC PANEL
Anion gap: 7 (ref 5–15)
BUN: 27 mg/dL — ABNORMAL HIGH (ref 8–23)
CO2: 27 mmol/L (ref 22–32)
Calcium: 9.2 mg/dL (ref 8.9–10.3)
Chloride: 104 mmol/L (ref 98–111)
Creatinine, Ser: 1.14 mg/dL (ref 0.61–1.24)
GFR, Estimated: >60 mL/min (ref >60)
Glucose, Bld: 131 mg/dL — ABNORMAL HIGH (ref 70–99)
Potassium: 4 mmol/L (ref 3.5–5.1)
Sodium: 138 mmol/L (ref 135–145)

## 2020-12-13 LAB — CBC
HCT: 40.7 % (ref 39.0–52.0)
Hemoglobin: 13 g/dL (ref 13.0–17.0)
MCH: 29.5 pg (ref 26.0–34.0)
MCHC: 31.9 g/dL (ref 30.0–36.0)
MCV: 92.5 fL (ref 80.0–100.0)
Platelets: 241 10*3/uL (ref 150–400)
RBC: 4.4 MIL/uL (ref 4.22–5.81)
RDW: 13.9 % (ref 11.5–15.5)
WBC: 9.7 10*3/uL (ref 4.0–10.5)
nRBC: 0 % (ref 0.0–0.2)

## 2020-12-13 MED ORDER — LORAZEPAM 2 MG/ML IJ SOLN
0.5000 mg | Freq: Once | INTRAMUSCULAR | Status: AC
  Administered 2020-12-13: 0.5 mg via INTRAVENOUS
  Filled 2020-12-13: qty 1

## 2020-12-13 MED ORDER — LACTATED RINGERS IV SOLN: INTRAVENOUS | Status: AC

## 2020-12-13 NOTE — Progress Notes (Signed)
Assessment & Plan: HD#2 - recurrent small bowel obstruction  Patient refuses NG tube placement and administration of gastrograffin  Suggest 2-view abd x-ray this AM  Continue NPO, sips of liquids, ice for comfort  Palliative care consult recommended  Will follow - very high risk of morbidity, mortality with operative intervention - will try to avoid        Armandina Gemma, MD       [facility]       Office: [phone]   Chief Complaint: Small bowel obstruction  Subjective: Patient in bed, comfortable.  No flatus or BM per patient. Intermittent nausea.  Objective: Vital signs in last 24 hours: Temp:  [97.6 F (36.4 C)-98.9 F (37.2 C)] 97.6 F (36.4 C) (05/22 0429) Pulse Rate:  [43-76] 72 (05/22 0429) Resp:  [15-30] 20 (05/22 0148) BP: (90-156)/(61-135) 110/85 (05/22 0429) SpO2:  [77 %-97 %] 97 % (05/22 0429) Last BM Date: 12/11/20  Intake/Output from previous day: 05/21 0701 - 05/22 0700 In: 1402.6 [I.V.:1202.6; IV Piggyback:200] Out: 650 [Urine:650] Intake/Output this shift: No intake/output data recorded.  Physical Exam: HEENT - sclerae clear, mucous membranes moist Neck - soft Abdomen - protuberant, "doughy"; non-tender; BS present Ext - non-tender Neuro - alert & oriented, no focal deficits  Lab Results:  Recent Labs    12/12/20 0338 12/13/20 0505  WBC 8.6 9.7  HGB 12.9* 13.0  HCT 40.5 40.7  PLT 232 241   BMET Recent Labs    12/12/20 0338 12/13/20 0505  NA 136 138  K 4.2 4.0  CL 105 104  CO2 24 27  GLUCOSE 122* 131*  BUN 28* 27*  CREATININE 0.88 1.14  CALCIUM 9.6 9.2   PT/INR No results for input(s): LABPROT, INR in the last 72 hours. Comprehensive Metabolic Panel:    Component Value Date/Time   NA 138 12/13/2020 0505   NA 136 12/12/2020 0338   NA 141 03/02/2017 0753   K 4.0 12/13/2020 0505   K 4.2 12/12/2020 0338   CL 104 12/13/2020 0505   CL 105 12/12/2020 0338   CO2 27 12/13/2020 0505   CO2 24 12/12/2020  0338   BUN 27 (H) 12/13/2020 0505   BUN 28 (H) 12/12/2020 0338   BUN 26 03/02/2017 0753   CREATININE 1.14 12/13/2020 0505   CREATININE 0.88 12/12/2020 0338   CREATININE 0.99 03/10/2014 1445   CREATININE 1.07 12/04/2012 1159   GLUCOSE 131 (H) 12/13/2020 0505   GLUCOSE 122 (H) 12/12/2020 0338   GLUCOSE 100 (H) 05/05/2006 0839   CALCIUM 9.2 12/13/2020 0505   CALCIUM 9.6 12/12/2020 0338   AST 15 12/12/2020 0338   AST 19 06/24/2019 0059   ALT 14 12/12/2020 0338   ALT 19 06/24/2019 0059   ALKPHOS 38 12/12/2020 0338   ALKPHOS 45 06/24/2019 0059   BILITOT 1.1 12/12/2020 0338   BILITOT 1.1 06/24/2019 0059   BILITOT 0.6 03/02/2017 0753   PROT 7.0 12/12/2020 0338   PROT 4.7 (L) 06/24/2019 0059   PROT 5.6 (L) 03/02/2017 0753   ALBUMIN 4.1 12/12/2020 0338   ALBUMIN 2.9 (L) 06/24/2019 0059   ALBUMIN 3.7 03/02/2017 0753    Studies/Results: CT ABDOMEN PELVIS WO CONTRAST  Result Date: 12/12/2020 CLINICAL DATA:  87 year old male with abdominal distension, increased left side abdominal pain and nausea since 2000 hours. EXAM: CT ABDOMEN AND PELVIS WITHOUT CONTRAST TECHNIQUE: Multidetector CT imaging of the abdomen and pelvis was performed following the standard protocol without IV contrast. COMPARISON:  CT Abdomen and Pelvis 08/15/2019 and earlier. FINDINGS: Lower chest: Mild cardiomegaly. Evidence of centrilobular emphysema. Lung base scarring and atelectasis is stable from last year. Hepatobiliary: Chronic cholelithiasis. No CT evidence of acute cholecystitis. Negative noncontrast liver. Pancreas: Atrophied, negative. Spleen: Negative. Adrenals/Urinary Tract: Normal adrenal glands. Stable nonobstructed kidneys. Occasional renal cysts. Renal vascular calcifications. Diminutive urinary bladder mildly displaced anteriorly from internal iliac artery aneurysms detailed below. Stomach/Bowel: Chronic mass effect on the sigmoid colon from massive internal iliac artery aneurysms (see below), series 2, image 69  has increased since 2019. But the upstream sigmoid colon is nondilated. Redundant sigmoid with diverticulosis incidentally noted. Mild retained stool in the rectum. Mild retained stool through the more proximal colon with normal appendix on series 2, image 48. No large bowel inflammation. Negative terminal ileum. Fluid-filled small bowel loops in the abdomen are at the upper limits of normal to mildly dilated, and there is a relatively abrupt transition along the ventral abdominal wall seen on series 2, image 48 and coronal image 34. No free air. No free fluid. Incidental duodenal diverticulum with no active inflammation. Stomach and duodenum not significantly dilated. Vascular/Lymphatic: Chronic Calcified aortic atherosclerosis. And repair of abdominal aortic aneurysm appears stable since 2019. Massive bilateral internal iliac artery aneurysms chronic but enlarged since 2019 (series 2, image 61 and coronal image 119 up to 82 x 95 mm now on the right side, versus 72 x 80 mm in 2019). Vascular patency is not evaluated in the absence of IV contrast. No lymphadenopathy. Reproductive: Negative. Other: No pelvic free fluid. Musculoskeletal: Osteopenia. Stable visualized osseous structures. Previous proximal femur ORIF. IMPRESSION: 1. Early or partial Small Bowel Obstruction suspected with a transition point along the ventral abdominal wall, likely due to adhesions in the setting of prior abdominal surgery. No free air or free fluid. 2. Massive bilateral internal iliac artery aneurysms are chronic but enlarged since 2019, now up to 82 x 95 mm on the right (versus 72 x 80 mm in 2019). Subsequent increased mass effect on the sigmoid colon as it traverses the mid pelvis, but no large bowel obstruction at this time. 3. Superimposed prior abdominal aortic aneurysm repair appears stable. Aortic Atherosclerosis (ICD10-I70.0). 4. Chronic cholelithiasis. 5. Emphysema (ICD10-J43.9).  Mild cardiomegaly. Electronically Signed   By: Genevie Ann M.D.   On: 12/12/2020 06:59      Armandina Gemma 12/13/2020  Patient ID: Jesse [name], male   DOB: August 16, 1927, 87 y.o.   MRN: [id number]

## 2020-12-13 NOTE — Plan of Care (Signed)
  Problem: Education: Goal: Knowledge of General Education information will improve Description: Including pain rating scale, medication(s)/side effects and non-pharmacologic comfort measures Outcome: Progressing   Problem: Clinical Measurements: Goal: Will remain free from infection Outcome: Progressing   Problem: Activity: Goal: Risk for activity intolerance will decrease Outcome: Progressing   Problem: Nutrition: Goal: Adequate nutrition will be maintained Outcome: Progressing   Problem: Coping: Goal: Level of anxiety will decrease Outcome: Progressing   Problem: Elimination: Goal: Will not experience complications related to bowel motility Outcome: Progressing   Problem: Safety: Goal: Ability to remain free from injury will improve Outcome: Progressing   Problem: Skin Integrity: Goal: Risk for impaired skin integrity will decrease Outcome: Progressing   

## 2020-12-13 NOTE — Consult Note (Signed)
Consultation Note Date: 12/13/2020   Patient Name: Jesse [name]  DOB: 09-21-1927  MRN: [id number]  Age / Sex: 87 y.o., male  PCP: [name], MD Referring Physician: Ezekiel Slocumb, DO  Reason for Consultation: Establishing goals of care  HPI/Patient Profile: 87 y.o. male  with past medical history of hypertension, hyperlipidemia, RBBB, CAD s/p CABG, AAA s/p repair and s/p femoral aneurysm repair, GIB, prostate cancer s/p radiation therapy, bladder tumor s/p resection, distant repair of bowel obstruction, hernia repair with mesh admitted on 12/12/2020 with lower abd pain with nausea and dry heaves, bloating, abd distention related to small bowel obstruction. Did not tolerate NGT and now refuses replacement.    Clinical Assessment and Goals of Care: I met today with Jesse [name] and daughter at bedside. He is alert, oriented and in good spirits. He is mentally sharp. Sitting up in recliner and able to reposition independently. I assisted him to bathroom and he did well with standby assist but did better with walker. He gives me a review of his surgical history and he has good understanding of his situation. The majority of our conversation revolved around questions regarding NGT placement as he had a bad experience with NGT yesterday and did not tolerate well. I answered to the best of my ability. I also frankly discussed with him that if his goal is to get better NGT is the best option for him. I discussed that surgery would be very risky for him and we want to avoid this if at all possible. I also explained that more surgery would only make the risks of complications and this to happen again more likely. He understands and he agrees to trial of placing NGT. I also discussed and he does share that he has DNR in place and this aligns with his Living Will reviewed in electronic chart. He does speak of a desire to be  comfortable and not suffer if he declines towards end of life. Of note, he does have moderate sized solid bowel movement during my visit.   I returned to assist RN with NGT placement and provide emotional support. He did receive medications prior to NGT insertion attempt. He struggled with gag reflex and coughing and was unable to swallow and NGT unable to be passed through back of the throat as he wretched and then would clamp down. He very much tried but this was unsuccessful. After failed attempt he does share with me that he does not believe he can make it through another surgery. I voiced understanding but also encouraged him to continue to walk as much as possible that there is still a chance (given BM today) that this could improve with conservative measures. We agreed to continue taking one day at a time.   All questions/concerns addressed. Emotional support provided.   Primary Decision Maker PATIENT    SUMMARY OF RECOMMENDATIONS   - NGT trial failed - Continue conservative measures - He knows that surgery is risky and will need more discussion if his status  declines to require surgical intervention - I am not sure this is something he would want  Code Status/Advance Care Planning:  DNR   Symptom Management:   Per attending, surgery   Palliative Prophylaxis:   Frequent Pain Assessment, Oral Care and Turn Reposition  Psycho-social/Spiritual:   Desire for further Chaplaincy support:yes  Prognosis:   Unable to determine  Discharge Planning: To Be Determined      Primary Diagnoses: Present on Admission: . SBO (small bowel obstruction) ([place]) . Aneurysm of abdominal vessel ([place]) . Essential hypertension . CAD, ARTERY BYPASS GRAFT . HLD (hyperlipidemia)   I have reviewed the medical record, interviewed the patient and family, and examined the patient. The following aspects are pertinent.  Past Medical History:  Diagnosis Date  . AAA (abdominal aortic aneurysm)  ([place])   . Arthritis   . Cancer ([place]) 2010   prostate ( 40 Txs. of radiation treatments)  . Coronary artery disease   . Environmental allergies   . GERD (gastroesophageal reflux disease)   . History of prostate cancer 2010  . HOH (hard of hearing)    wears bilateral hearing aids  . Hyperlipidemia   . Hypertension   . Kidney stone 1968  . Myocardial infarction ([place]) 1992  . Pneumonia    hx of  . RBBB    Social History   Socioeconomic History  . Marital status: Widowed    Spouse name: Not on file  . Number of children: Not on file  . Years of education: Not on file  . Highest education level: Not on file  Occupational History  . Not on file  Tobacco Use  . Smoking status: Former Smoker    Years: 20.00    Types: Cigarettes    Quit date: 07/25/1968    Years since quitting: 52.4  . Smokeless tobacco: Never Used  Vaping Use  . Vaping Use: Never used  Substance and Sexual Activity  . Alcohol use: No  . Drug use: No  . Sexual activity: Not on file  Other Topics Concern  . Not on file  Social History Narrative  . Not on file   Social Determinants of Health   Financial Resource Strain: Not on file  Food Insecurity: Not on file  Transportation Needs: Not on file  Physical Activity: Not on file  Stress: Not on file  Social Connections: Not on file   Family History  Problem Relation Age of Onset  . Cancer Mother   . Cancer Father   . Varicose Veins Father   . Cancer Sister   . Diabetes Sister   . Heart attack Sister    Scheduled Meds: . diatrizoate meglumine-sodium  90 mL Per NG tube Once  . heparin  5,000 Units Subcutaneous Q8H  . hydrALAZINE  5 mg Intravenous Once  . lip balm  1 application Topical BID  . sodium chloride flush  3 mL Intravenous Q12H   Continuous Infusions: . lactated ringers 1,000 mL (12/13/20 0936)  . methocarbamol (ROBAXIN) IV Stopped (12/12/20 2209)   PRN Meds:.acetaminophen, alum & mag hydroxide-simeth, HYDROmorphone (DILAUDID)  injection, lactated ringers, magic mouthwash, menthol-cetylpyridinium, methocarbamol (ROBAXIN) IV, metoprolol tartrate, ondansetron **OR** ondansetron (ZOFRAN) IV, phenol Allergies  Allergen Reactions  . Quinolones Other (See Comments)    unknown  . Adhesive [Tape] Itching and Rash   Review of Systems  Gastrointestinal: Positive for abdominal distention, abdominal pain and nausea.    Physical Exam Vitals and nursing note reviewed.  Constitutional:  General: He is not in acute distress.    Appearance: He is ill-appearing.  Cardiovascular:     Rate and Rhythm: Normal rate.  Pulmonary:     Effort: No tachypnea, accessory muscle usage or respiratory distress.  Abdominal:     General: There is distension.     Palpations: Abdomen is soft.  Neurological:     Mental Status: He is alert and oriented to person, place, and time.     Vital Signs: BP 110/85 (BP Location: Left Arm)   Pulse 72   Temp 97.6 F (36.4 C) (Oral)   Resp 20   Ht 6' (1.829 m)   Wt 79.4 kg   SpO2 97%   BMI 23.73 kg/m  Pain Scale: 0-10   Pain Score: Asleep   SpO2: SpO2: 97 % O2 Device:SpO2: 97 % O2 Flow Rate: .   IO: Intake/output summary:   Intake/Output Summary (Last 24 hours) at 12/13/2020 1107 Last data filed at 12/13/2020 8367 Gross per 24 hour  Intake 1402.63 ml  Output 650 ml  Net 752.63 ml    LBM: Last BM Date: 12/11/20 Baseline Weight: Weight: 79.4 kg Most recent weight: Weight: 79.4 kg     Palliative Assessment/Data:     Time In: 1130 Time Out: 1300 Time Total: 90 min Greater than 50%  of this time was spent counseling and coordinating care related to the above assessment and plan.  Signed by: Vinie Sill, NP Palliative Medicine Team Pager # [phone] (M-F 8a-5p) Team Phone # [phone] (Nights/Weekends)

## 2020-12-13 NOTE — Evaluation (Signed)
Occupational Therapy Evaluation Patient Details Name: Jesse [name] MRN: [id number] DOB: November 21, 1927 Today's Date: 12/13/2020    History of Present Illness Patient admitted with SBO. This is a 87 year old male with past medical history of hypertension, hyperlipidemia, RBBB, CAD s/p CABG, AAA s/p repair and s/p bilateral femoral aneurysm repair in '95, thoracic aneurysm, GI bleed, prostate cancer s/p XRT, bladder tumor s/p resection, distant repair of bowel obstruction, hernia repair with mesh who presented to the ED from home with lower abdominal pain, nausea and dry heaves   Clinical Impression   [name] presents up in the chair about to get an NGT with a distended abdomen. On evaluation he demonstrates normal upper body strength, ability to don socks, functional balance, and ability to ambulate the entire hall with RW. Patient reports feeling weaker than normal but at his baseline in regards to functional mobility and ADLs. Patient has ambulated in hall with nursing. At this time - has no OT needs. Recommend nursing continue to ambulate patient.     Follow Up Recommendations  No OT follow up    Equipment Recommendations  None recommended by OT    Recommendations for Other Services       Precautions / Restrictions Precautions Precautions: Fall Precaution Comments: reports history of falls Restrictions Weight Bearing Restrictions: No      Mobility Bed Mobility               General bed mobility comments: up in recliner    Transfers   Equipment used: Rolling walker (2 wheeled)             General transfer comment: Ambulated the entire hall with RW at a brisk pace.    Balance Overall balance assessment: Mild deficits observed, not formally tested                                         ADL either performed or assessed with clinical judgement   ADL Overall ADL's : At baseline                                              Vision Baseline Vision/History: Wears glasses Patient Visual Report: No change from baseline       Perception     Praxis      Pertinent Vitals/Pain Pain Assessment: No/denies pain     Hand Dominance Right   Extremity/Trunk Assessment Upper Extremity Assessment Upper Extremity Assessment: RUE deficits/detail;LUE deficits/detail RUE Deficits / Details: WFL ROM, 5/5 strength RUE Sensation: WNL RUE Coordination: WNL LUE Deficits / Details: WFL ROM, 5/5 strength LUE Sensation: WNL LUE Coordination: WNL           Communication Communication Communication: No difficulties   Cognition Arousal/Alertness: Awake/alert Behavior During Therapy: WFL for tasks assessed/performed Overall Cognitive Status: Within Functional Limits for tasks assessed                                     General Comments       Exercises     Shoulder Instructions      Home Living Family/patient expects to be discharged to:: Assisted living  Home Equipment: [place] - 2 wheels;Cane - single point          Prior Functioning/Environment Level of Independence: Independent with assistive device(s)        Comments: Independent with ADLs. has supervision for bathing. Ambulates with cane or walker.        OT Problem List: Decreased activity tolerance;Pain      OT Treatment/Interventions:      OT Goals(Current goals can be found in the care plan section) Acute Rehab OT Goals OT Goal Formulation: All assessment and education complete, DC therapy  OT Frequency:     Barriers to D/C:            Co-evaluation              AM-PAC OT "6 Clicks" Daily Activity     Outcome Measure Help from another person eating meals?: Total (NPO - but would be independent) Help from another person taking care of personal grooming?: None Help from another person toileting, which includes using toliet, bedpan, or urinal?: None Help from  another person bathing (including washing, rinsing, drying)?: None Help from another person to put on and taking off regular upper body clothing?: None Help from another person to put on and taking off regular lower body clothing?: None 6 Click Score: 21   End of Session Equipment Utilized During Treatment: Rolling walker Nurse Communication: Mobility status  Activity Tolerance: Patient tolerated treatment well Patient left: in chair;with call bell/phone within reach  OT Visit Diagnosis: Pain                Time: 1255-1309 OT Time Calculation (min): 14 min Charges:  OT General Charges $OT Visit: 1 Visit OT Evaluation $OT Eval Low Complexity: 1 Low  Jesse [name], OTR/L [place]  Office [phone] Pager: [place] 12/13/2020, 2:54 PM

## 2020-12-13 NOTE — Progress Notes (Signed)
PT Cancellation Note / Screen  Patient Details Name: Jesse [name] MRN: [id number] DOB: 04/29/1928   Cancelled Treatment:    Reason Eval/Treat Not Completed: PT screened, no needs identified, will sign off Pt mobilizing at baseline per OT, and OT reports no current PT needs.  Recommend nursing ambulate with pt during acute stay.  Please re-consult if new needs arise.   Sukhmani Fetherolf,KATHrine E 12/13/2020, 3:03 PM Arlyce Dice, DPT Acute Rehabilitation Services Pager: [phone] Office: [phone]

## 2020-12-13 NOTE — Progress Notes (Addendum)
PROGRESS NOTE    Jesse [name]   WSF:[id number]  DOB: 04/26/1928  PCP: [name], MD    DOA: 12/12/2020 LOS: 1   Brief Narrative   87 year old male with past medical history of hypertension, hyperlipidemia, RBBB, CAD s/p CABG, AAA s/p repair and s/p bilateral femoral aneurysm repair in '95, thoracic aneurysm, GI bleed, prostate cancer s/p XRT, bladder tumor s/p resection, distant repair of bowel obstruction, hernia repair with mesh who presented to the ED from home with lower abdominal pain, nausea and dry heaves since 8 PM last night.  Currently admits to bloating, abdominal distention and intermittent abdominal pain from spasms and belching but no vomiting.  Last BM was yesterday but small.    Found to have early or partial SBO with transition point along the ventral abdominal wall likely due to adhesions in the setting of prior abdominal surgery with no free air or free fluid, chronic massive bilateral internal iliac artery aneurysms but enlarged since 2019 with subsequent increased mass-effect on the sigmoid colon as it traverses the mid pelvis but no large bowel obstruction, otherwise chronic issues. on CT abdomen/pelvis.    Pt could not tolerate NG tube placement.   Admitted to hospitalist service with general surgery consulted.  522: Third attempt at NG tube placement with patient premedicated with Ativan was again unsuccessful. Abdomen remains distended but patient is passing gas and also had a bowel movement today.  Palliative consulted.    Assessment & Plan   Principal Problem:   SBO (small bowel obstruction) (HCC) Active Problems:   HLD (hyperlipidemia)   Essential hypertension   CAD, ARTERY BYPASS GRAFT   Iliac artery aneurysm, bilateral (HCC)   Aneurysm of abdominal vessel (HCC)   Small bowel obstruction secondary to adhesions -seems improving clinically.  Patient having flatulence and adequate bowel movement today, 5/22. --General surgery following, patient high  risk for any surgical intervention -- Unable to tolerate NG tube insertion -- N.p.o. --IV fluids -- Supportive care: As needed antiemetics and pain control  Abnormal UA /pyuria -patient has no urinary symptoms.  Has history of bladder tumor resection which could explain the pyuria due to inflammation.  Defer antibiotics and monitor clinically.  Chronic massive bilateral internal iliac artery aneurysms with extensive vascular history -admitting hospitalist discussed with vascular surgeon Dr. Scot Dock.  No acute issues and no intervention or evaluation warranted this admission. -- Close outpatient follow-up  CAD status post CABG /hyperlipidemia -stable --Holding aspirin, Coreg, statin, fenofibrate due to n.p.o. status  Patient BMI: Body mass index is 23.73 kg/m.   DVT prophylaxis: heparin injection 5,000 Units Start: 12/12/20 0815   Diet:  Diet Orders (From admission, onward)    Start     Ordered   12/12/20 0756  Diet NPO time specified Except for: Ice Chips  Diet effective now       Comments: 63mL q2H PRN ice chips Ramar  Question:  Except for  Answer:  Ice Chips   12/12/20 0755            Code Status: DNR    Subjective 12/13/20    Patient seen with daughter and granddaughter at bedside today.  He is up in recliner.  Feeling a little bit better.  Does report he is passing some gas.  Shortly after encounter I was notified patient had a fairly large bowel movement.  After much discussion and back-and-forth, patient agreed to try NG tube insertion again if premedicated with Ativan to help him relax.  This  was attempted but not successful.  Patient starts choking incident in the tube is back his throat.  He has frequent belching and hiccups but not having any significant pain.   Disposition Plan & Communication   Status is: Inpatient  Remains inpatient appropriate because:IV treatments appropriate due to intensity of illness or inability to take PO   Dispo: The patient is  from: Home              Anticipated d/c is to: Home              Patient currently is not medically stable to d/c.   Difficult to place patient No   Family Communication: Daughter and granddaughter at bedside on rounds today   Consults, Procedures, Significant Events   Consultants:   General surgery  Palliative care  Procedures:   3 failed attempts at NG tube insertion  Antimicrobials:  Anti-infectives (From admission, onward)   Start     Dose/Rate Route Frequency Ordered Stop   12/12/20 0715  cefTRIAXone (ROCEPHIN) 1 g in sodium chloride 0.9 % 100 mL IVPB        1 g 200 mL/hr over 30 Minutes Intravenous  Once 12/12/20 0706 12/12/20 0841        Micro    Objective   Vitals:   12/12/20 2259 12/13/20 0148 12/13/20 0429 12/13/20 1315  BP: 102/67 111/79 110/85 130/89  Pulse:  71 72 75  Resp:  20  14  Temp:  98 F (36.7 C) 97.6 F (36.4 C) 98.3 F (36.8 C)  TempSrc:   Oral Oral  SpO2:  95% 97% 95%  Weight:      Height:        Intake/Output Summary (Last 24 hours) at 12/13/2020 1957 Last data filed at 12/13/2020 1803 Gross per 24 hour  Intake 1766.59 ml  Output 725 ml  Net 1041.59 ml   Filed Weights   12/12/20 0323  Weight: 79.4 kg    Physical Exam:  General exam: awake, alert, no acute distress HEENT: atraumatic, clear conjunctiva, anicteric sclera, moist mucus membranes, hearing grossly normal  Respiratory system: CTAB, no wheezes, rales or rhonchi, normal respiratory effort. Cardiovascular system: normal S1/S2, RRR, no JVD, murmurs, rubs, gallops, no pedal edema.   Gastrointestinal system: Distended abdomen with large ventral hernia, nontender, positive bowel sounds, having frequent belching and hiccups. Central nervous system: A&O x4. no gross focal neurologic deficits, normal speech Extremities: moves all, no edema, normal tone Skin: dry, intact, normal temperature, normal color, No rashes, lesions or ulcers seen on visualized skin Psychiatry:  normal mood, congruent affect, judgement and insight appear normal  Labs   Data Reviewed: I have personally reviewed following labs and imaging studies  CBC: Recent Labs  Lab 12/12/20 0338 12/13/20 0505  WBC 8.6 9.7  HGB 12.9* 13.0  HCT 40.5 40.7  MCV 92.3 92.5  PLT 232 366   Basic Metabolic Panel: Recent Labs  Lab 12/12/20 0338 12/13/20 0505  NA 136 138  K 4.2 4.0  CL 105 104  CO2 24 27  GLUCOSE 122* 131*  BUN 28* 27*  CREATININE 0.88 1.14  CALCIUM 9.6 9.2   GFR: Estimated Creatinine Clearance: 47.3 mL/min (by C-G formula based on SCr of 1.14 mg/dL). Liver Function Tests: Recent Labs  Lab 12/12/20 0338  AST 15  ALT 14  ALKPHOS 38  BILITOT 1.1  PROT 7.0  ALBUMIN 4.1   Recent Labs  Lab 12/12/20 0338  LIPASE 29   No results  for input(s): AMMONIA in the last 168 hours. Coagulation Profile: No results for input(s): INR, PROTIME in the last 168 hours. Cardiac Enzymes: No results for input(s): CKTOTAL, CKMB, CKMBINDEX, TROPONINI in the last 168 hours. BNP (last 3 results) No results for input(s): PROBNP in the last 8760 hours. HbA1C: No results for input(s): HGBA1C in the last 72 hours. CBG: No results for input(s): GLUCAP in the last 168 hours. Lipid Profile: No results for input(s): CHOL, HDL, LDLCALC, TRIG, CHOLHDL, LDLDIRECT in the last 72 hours. Thyroid Function Tests: No results for input(s): TSH, T4TOTAL, FREET4, T3FREE, THYROIDAB in the last 72 hours. Anemia Panel: No results for input(s): VITAMINB12, FOLATE, FERRITIN, TIBC, IRON, RETICCTPCT in the last 72 hours. Sepsis Labs: No results for input(s): PROCALCITON, LATICACIDVEN in the last 168 hours.  Recent Results (from the past 240 hour(s))  SARS CORONAVIRUS 2 (TAT 6-24 HRS) Nasopharyngeal Nasopharyngeal Swab     Status: None   Collection Time: 12/12/20  7:36 AM   Specimen: Nasopharyngeal Swab  Result Value Ref Range Status   SARS Coronavirus 2 NEGATIVE NEGATIVE Final    Comment:  (NOTE) SARS-CoV-2 target nucleic acids are NOT DETECTED.  The SARS-CoV-2 RNA is generally detectable in upper and lower respiratory specimens during the acute phase of infection. Negative results do not preclude SARS-CoV-2 infection, do not rule out co-infections with other pathogens, and should not be used as the sole basis for treatment or other patient management decisions. Negative results must be combined with clinical observations, patient history, and epidemiological information. The expected result is Negative.  Fact Sheet for Patients: SugarRoll.be  Fact Sheet for Healthcare Providers: https://www.woods-mathews.com/  This test is not yet approved or cleared by the [place] FDA and  has been authorized for detection and/or diagnosis of SARS-CoV-2 by FDA under an Emergency Use Authorization (EUA). This EUA will remain  in effect (meaning this test can be used) for the duration of the COVID-19 declaration under Se ction 564(b)(1) of the Act, 21 U.S.C. section 360bbb-3(b)(1), unless the authorization is terminated or revoked sooner.  Performed at [facility], [place] [address], [place], [place] [address]       Imaging Studies   CT ABDOMEN PELVIS WO CONTRAST  Result Date: 12/12/2020 CLINICAL DATA:  87 year old male with abdominal distension, increased left side abdominal pain and nausea since 2000 hours. EXAM: CT ABDOMEN AND PELVIS WITHOUT CONTRAST TECHNIQUE: Multidetector CT imaging of the abdomen and pelvis was performed following the standard protocol without IV contrast. COMPARISON:  CT Abdomen and Pelvis 08/15/2019 and earlier. FINDINGS: Lower chest: Mild cardiomegaly. Evidence of centrilobular emphysema. Lung base scarring and atelectasis is stable from last year. Hepatobiliary: Chronic cholelithiasis. No CT evidence of acute cholecystitis. Negative noncontrast liver. Pancreas: Atrophied, negative. Spleen: Negative.  Adrenals/Urinary Tract: Normal adrenal glands. Stable nonobstructed kidneys. Occasional renal cysts. Renal vascular calcifications. Diminutive urinary bladder mildly displaced anteriorly from internal iliac artery aneurysms detailed below. Stomach/Bowel: Chronic mass effect on the sigmoid colon from massive internal iliac artery aneurysms (see below), series 2, image 69 has increased since 2019. But the upstream sigmoid colon is nondilated. Redundant sigmoid with diverticulosis incidentally noted. Mild retained stool in the rectum. Mild retained stool through the more proximal colon with normal appendix on series 2, image 48. No large bowel inflammation. Negative terminal ileum. Fluid-filled small bowel loops in the abdomen are at the upper limits of normal to mildly dilated, and there is a relatively abrupt transition along the ventral abdominal wall seen on series  2, image 48 and coronal image 34. No free air. No free fluid. Incidental duodenal diverticulum with no active inflammation. Stomach and duodenum not significantly dilated. Vascular/Lymphatic: Chronic Calcified aortic atherosclerosis. And repair of abdominal aortic aneurysm appears stable since 2019. Massive bilateral internal iliac artery aneurysms chronic but enlarged since 2019 (series 2, image 61 and coronal image 119 up to 82 x 95 mm now on the right side, versus 72 x 80 mm in 2019). Vascular patency is not evaluated in the absence of IV contrast. No lymphadenopathy. Reproductive: Negative. Other: No pelvic free fluid. Musculoskeletal: Osteopenia. Stable visualized osseous structures. Previous proximal femur ORIF. IMPRESSION: 1. Early or partial Small Bowel Obstruction suspected with a transition point along the ventral abdominal wall, likely due to adhesions in the setting of prior abdominal surgery. No free air or free fluid. 2. Massive bilateral internal iliac artery aneurysms are chronic but enlarged since 2019, now up to 82 x 95 mm on the right  (versus 72 x 80 mm in 2019). Subsequent increased mass effect on the sigmoid colon as it traverses the mid pelvis, but no large bowel obstruction at this time. 3. Superimposed prior abdominal aortic aneurysm repair appears stable. Aortic Atherosclerosis (ICD10-I70.0). 4. Chronic cholelithiasis. 5. Emphysema (ICD10-J43.9).  Mild cardiomegaly. Electronically Signed   By: Genevie Ann M.D.   On: 12/12/2020 06:59     Medications   Scheduled Meds: . diatrizoate meglumine-sodium  90 mL Per NG tube Once  . heparin  5,000 Units Subcutaneous Q8H  . hydrALAZINE  5 mg Intravenous Once  . lip balm  1 application Topical BID  . sodium chloride flush  3 mL Intravenous Q12H   Continuous Infusions: . lactated ringers 1,000 mL (12/13/20 1714)  . lactated ringers 75 mL/hr at 12/13/20 1803  . methocarbamol (ROBAXIN) IV Stopped (12/12/20 2209)       LOS: 1 day    Time spent: 35 minutes with greater than 50% spent at bedside for extensive patient counseling and in coordination of care    Ezekiel Slocumb, DO [facility]  12/13/2020, 7:57 PM      If 7PM-7AM, please contact night-coverage. How to contact the [facility] Attending or Consulting provider [place] or covering provider during after hours [place], for this patient?    1. Check the care team in [facility] and look for a) attending/consulting [facility] provider listed and b) the [facility] team listed 2. Log into [email or web address] and use [place]'s universal password to access. If you do not have the password, please contact the hospital operator. 3. Locate the [facility] provider you are looking for under [facility] and page to a number that you can be directly reached. 4. If you still have difficulty reaching the provider, please page the [facility] (Director on Call) for the Hospitalists listed on amion for assistance.

## 2020-12-13 NOTE — Hospital Course (Signed)
87 year old male with past medical history of hypertension, hyperlipidemia, RBBB, CAD s/p CABG, AAA s/p repair and s/p bilateral femoral aneurysm repair in '95, thoracic aneurysm, GI bleed, prostate cancer s/p XRT, bladder tumor s/p resection, distant repair of bowel obstruction, hernia repair with mesh who presented to the ED from home with lower abdominal pain, nausea and dry heaves since 8 PM last night.  Currently admits to bloating, abdominal distention and intermittent abdominal pain from spasms and belching but no vomiting.  Last BM was yesterday but small.    Found to have early or partial SBO with transition point along the ventral abdominal wall likely due to adhesions in the setting of prior abdominal surgery with no free air or free fluid, chronic massive bilateral internal iliac artery aneurysms but enlarged since 2019 with subsequent increased mass-effect on the sigmoid colon as it traverses the mid pelvis but no large bowel obstruction, otherwise chronic issues. on CT abdomen/pelvis.    Pt could not tolerate NG tube placement.   Admitted to hospitalist service with general surgery consulted.  522: Third attempt at NG tube placement with patient premedicated with Ativan was again unsuccessful. Abdomen remains distended but patient is passing gas and also had a bowel movement today.  Palliative consulted.

## 2020-12-14 DIAGNOSIS — Z7189 Other specified counseling: Secondary | ICD-10-CM | POA: Diagnosis not present

## 2020-12-14 DIAGNOSIS — Z515 Encounter for palliative care: Secondary | ICD-10-CM | POA: Diagnosis not present

## 2020-12-14 DIAGNOSIS — K56609 Unspecified intestinal obstruction, unspecified as to partial versus complete obstruction: Secondary | ICD-10-CM | POA: Diagnosis not present

## 2020-12-14 LAB — CBC
HCT: 40.2 % (ref 39.0–52.0)
Hemoglobin: 12.8 g/dL — ABNORMAL LOW (ref 13.0–17.0)
MCH: 29.8 pg (ref 26.0–34.0)
MCHC: 31.8 g/dL (ref 30.0–36.0)
MCV: 93.5 fL (ref 80.0–100.0)
Platelets: 238 10*3/uL (ref 150–400)
RBC: 4.3 MIL/uL (ref 4.22–5.81)
RDW: 13.9 % (ref 11.5–15.5)
WBC: 8.7 10*3/uL (ref 4.0–10.5)
nRBC: 0 % (ref 0.0–0.2)

## 2020-12-14 LAB — BASIC METABOLIC PANEL
Anion gap: 6 (ref 5–15)
BUN: 35 mg/dL — ABNORMAL HIGH (ref 8–23)
CO2: 29 mmol/L (ref 22–32)
Calcium: 9.2 mg/dL (ref 8.9–10.3)
Chloride: 102 mmol/L (ref 98–111)
Creatinine, Ser: 0.92 mg/dL (ref 0.61–1.24)
GFR, Estimated: >60 mL/min (ref >60)
Glucose, Bld: 120 mg/dL — ABNORMAL HIGH (ref 70–99)
Potassium: 3.8 mmol/L (ref 3.5–5.1)
Sodium: 137 mmol/L (ref 135–145)

## 2020-12-14 LAB — URINE CULTURE: Culture: NO GROWTH

## 2020-12-14 MED ORDER — DIAZEPAM 5 MG/ML IJ SOLN
5.0000 mg | Freq: Three times a day (TID) | INTRAMUSCULAR | Status: DC | PRN
  Administered 2020-12-17 – 2020-12-23 (×6): 5 mg via INTRAVENOUS
  Filled 2020-12-14 (×6): qty 2

## 2020-12-14 MED ORDER — LORAZEPAM 2 MG/ML IJ SOLN: 0.5000 mg | INTRAMUSCULAR | Status: AC

## 2020-12-14 MED ORDER — GLYCOPYRROLATE 0.2 MG/ML IJ SOLN
0.4000 mg | Freq: Three times a day (TID) | INTRAMUSCULAR | Status: DC
  Administered 2020-12-14 – 2020-12-21 (×21): 0.4 mg via INTRAVENOUS
  Filled 2020-12-14 (×22): qty 2

## 2020-12-14 MED ORDER — PROCHLORPERAZINE EDISYLATE 10 MG/2ML IJ SOLN
10.0000 mg | Freq: Four times a day (QID) | INTRAMUSCULAR | Status: DC | PRN
  Administered 2020-12-14 (×2): 10 mg via INTRAVENOUS
  Filled 2020-12-14 (×2): qty 2

## 2020-12-14 MED ORDER — SODIUM CHLORIDE 0.9 % IV SOLN
12.5000 mg | Freq: Four times a day (QID) | INTRAVENOUS | Status: DC | PRN
  Administered 2020-12-14: 12.5 mg via INTRAVENOUS
  Filled 2020-12-14: qty 0.5
  Filled 2020-12-14: qty 12.5
  Filled 2020-12-14: qty 0.5

## 2020-12-14 MED ORDER — SODIUM CHLORIDE 0.9% IV SOLUTION: Freq: Once | INTRAVENOUS | Status: DC

## 2020-12-14 NOTE — Plan of Care (Signed)
  Problem: Clinical Measurements: Goal: Will remain free from infection Outcome: Progressing Goal: Diagnostic test results will improve Outcome: Progressing   Problem: Activity: Goal: Risk for activity intolerance will decrease Outcome: Progressing   Problem: Coping: Goal: Level of anxiety will decrease Outcome: Progressing   Problem: Pain Managment: Goal: General experience of comfort will improve Outcome: Progressing   

## 2020-12-14 NOTE — Progress Notes (Signed)
[facility] Progress Note     Subjective: CC-  Vomited multiple times today. Still distended although he thinks a little less so. Passed some flatus yesterday, none today. BM yesterday. WBC 8.7, afebrile  Objective: Vital signs in last 24 hours: Temp:  [97.9 F (36.6 C)-99.4 F (37.4 C)] 97.9 F (36.6 C) (05/23 0443) Pulse Rate:  [71-75] 74 (05/23 0443) Resp:  [14-20] 20 (05/22 2026) BP: (130-146)/(89-94) 146/94 (05/23 0443) SpO2:  [94 %-95 %] 94 % (05/22 2026) Last BM Date: 12/13/20  Intake/Output from previous day: 05/22 0701 - 05/23 0700 In: 1199.1 [IV Piggyback:1199.1] Out: 500 [Urine:450; Emesis/NG output:50] Intake/Output this shift: No intake/output data recorded.  PE: Gen:  Alert, NAD, pleasant Pulm: rate and effort normal Abd: distended but soft, mild lower abdominal TTP without rebound or guarding, few BS heard  Lab Results:  Recent Labs    12/13/20 0505 12/14/20 0451  WBC 9.7 8.7  HGB 13.0 12.8*  HCT 40.7 40.2  PLT 241 238   BMET Recent Labs    12/13/20 0505 12/14/20 0451  NA 138 137  K 4.0 3.8  CL 104 102  CO2 27 29  GLUCOSE 131* 120*  BUN 27* 35*  CREATININE 1.14 0.92  CALCIUM 9.2 9.2   PT/INR No results for input(s): LABPROT, INR in the last 72 hours. CMP     Component Value Date/Time   NA 137 12/14/2020 0451   NA 141 03/02/2017 0753   K 3.8 12/14/2020 0451   CL 102 12/14/2020 0451   CO2 29 12/14/2020 0451   GLUCOSE 120 (H) 12/14/2020 0451   GLUCOSE 100 (H) 05/05/2006 0839   BUN 35 (H) 12/14/2020 0451   BUN 26 03/02/2017 0753   CREATININE 0.92 12/14/2020 0451   CREATININE 0.99 03/10/2014 1445   CALCIUM 9.2 12/14/2020 0451   PROT 7.0 12/12/2020 0338   PROT 5.6 (L) 03/02/2017 0753   ALBUMIN 4.1 12/12/2020 0338   ALBUMIN 3.7 03/02/2017 0753   AST 15 12/12/2020 0338   ALT 14 12/12/2020 0338   ALKPHOS 38 12/12/2020 0338   BILITOT 1.1 12/12/2020 0338   BILITOT 0.6 03/02/2017 0753   GFRNONAA >60 12/14/2020 0451    GFRAA >60 08/20/2019 0438   Lipase     Component Value Date/Time   LIPASE 29 12/12/2020 0338       Studies/Results: No results found.  Anti-infectives: Anti-infectives (From admission, onward)   Start     Dose/Rate Route Frequency Ordered Stop   12/12/20 0715  cefTRIAXone (ROCEPHIN) 1 g in sodium chloride 0.9 % 100 mL IVPB        1 g 200 mL/hr over 30 Minutes Intravenous  Once 12/12/20 0706 12/12/20 0841       Assessment/Plan CAD s/p CABG Bilateral internal iliac artery aneurysms  Prior AAA repair HTN HLD RBBB Prostate cancer s/p XRT  Recurrent SBO - Failed multiple attempts at bedside NG tube placement. Will see if radiology can assist. Keep NPO. Patient would be very high risk with any operative intervention, hopefully we can avoid this. Palliative team is following.  ID - rocephin 5/21 x1 FEN - IVF, NPO VTE - SCDs, sq heparin Foley - none   LOS: 2 days    Wellington Hampshire, [facility] Surgery 12/14/2020, 11:37 AM Please see Amion for pager number during day hours 7:00am-4:30pm

## 2020-12-14 NOTE — Progress Notes (Signed)
Palliative:  HPI: 87 y.o. male  with past medical history of hypertension, hyperlipidemia, RBBB, CAD s/p CABG, AAA s/p repair and s/p femoral aneurysm repair, GIB, prostate cancer s/p radiation therapy, bladder tumor s/p resection, distant repair of bowel obstruction, hernia repair with mesh admitted on 12/12/2020 with lower abd pain with nausea and dry heaves, bloating, abd distention related to small bowel obstruction. Did not tolerate NGT and now refuses replacement.    I met today with Jesse [name] but he is sleeping comfortably and no family at bedside. RN reports that he has had worsening nausea, vomiting today and has been very miserable unless sleeping secondary to medication. Difficulty to control his symptoms today. Agree with phenergan if fails Zofran and Compazine. I will add Robinul to try and decrease secretions adding to nausea. Agree with considering NGT placement under conscious sedation but I do not know if he will even agree to another attempt. He also did not seem enthused to pursue surgical intervention yesterday and even discussed more about desiring comfort and that he did not believe that he would survive another surgery. I plan to follow up tomorrow and discuss further with Jesse [name] and family.   Exam: Resting comfortably. No distress currently. Abd distended.   Plan: - Will follow up tomorrow. Ongoing goals of care discussions.   15 min  Vinie Sill, NP Palliative Medicine Team Pager [phone] (Please see [email or web address] for schedule) Team Phone [phone]    Greater than 50%  of this time was spent counseling and coordinating care related to the above assessment and plan

## 2020-12-14 NOTE — Progress Notes (Signed)
Chaplain Cyndee Brightly attempted to see patient this afternoon, but patient was asleep. We will plan to follow up with him tomorrow, but if something is needed before then, please page [place].  [place], Carl Pager, [phone] 4:27 PM

## 2020-12-14 NOTE — NC FL2 (Signed)
[place] LEVEL OF CARE SCREENING TOOL     IDENTIFICATION  Patient Name: Jesse [name] Birthdate: September 14, 1927 Sex: male Admission Date (Current Location): 12/12/2020  [facility] [place] Number:  Herbalist and Address:  [facility],  [place], [place]      Provider Number: [id number]  Attending Physician Name and Address:  Ezekiel Slocumb, DO  Relative Name and Phone Number:  [name] son [phone]    Current Level of Care: Hospital Recommended Level of Care: [place] Prior Approval Number:    Date Approved/Denied:   PASRR Number:    Discharge Plan: Other (Comment) (ALF-[facility])    Current Diagnoses: Patient Active Problem List   Diagnosis Date Noted  . SBO (small bowel obstruction) ([place]) 12/12/2020  . Abducens (sixth) nerve palsy, left 02/20/2020  . Binocular vision disorder with diplopia 02/20/2020  . Gross hematuria 08/15/2019  . History of prostate cancer 08/15/2019  . Anxiety 06/21/2019  . Anemia due to chronic illness 06/20/2019  . Pneumonia due to COVID-19 virus 06/20/2019  . Displaced spiral fracture of shaft of right femur, initial encounter for open fracture type I or II ([place]) 06/20/2019  . COVID-19 virus infection 06/19/2019  . Surgery, elective   . Hip fracture ([place]) 04/15/2019  . Fall   . Coronary artery disease involving native coronary artery of native heart without angina pectoris 11/20/2018  . Thoracic aortic aneurysm without rupture ([place]) 11/20/2018  . Chronic systolic heart failure ([place]) 11/20/2018  . Duodenal ulcer with hemorrhage 05/05/2018  . Hemorrhagic shock ([place]) 05/05/2018  . AKI (acute kidney injury) ([place]) 05/05/2018  . Orthostatic hypotension 05/04/2018  . Symptomatic anemia 05/04/2018  . Melena 05/04/2018  . GI bleed 05/04/2018  . Patella fracture 06/09/2015  . Aftercare following surgery of the circulatory system, [place] 01/08/2013  . Pain in limb- Left  popliteal 12/25/2012  . Aneurysm artery, femoral ([place]) 05/22/2012  . 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 Aneurysm of abdominal vessel ([place]) 02/21/2012  . Iliac artery aneurysm, bilateral ([place]) 08/09/2011  . Aneurysm of artery of lower extremity ([place]) 08/09/2011  . HLD (hyperlipidemia) 01/16/2009  . Essential hypertension 01/16/2009  . CAD, ARTERY BYPASS GRAFT 01/16/2009  . PERIPHERAL VASCULAR DISEASE 01/16/2009  . ABDOMINAL AORTIC ANEURYSM REPAIR, HX OF 01/16/2009  . MIXED HYPERLIPIDEMIA 12/28/2008    Orientation RESPIRATION BLADDER Height & Weight     Self,Time,Situation,Place  Normal Continent Weight: 79.4 kg Height:  6' (182.9 cm)  BEHAVIORAL SYMPTOMS/MOOD NEUROLOGICAL BOWEL NUTRITION STATUS      Continent Diet (Reg)  AMBULATORY STATUS COMMUNICATION OF NEEDS Skin   Independent Verbally Normal                       Personal Care Assistance Level of Assistance  Bathing,Feeding,Dressing Bathing Assistance: Independent Feeding assistance: Independent Dressing Assistance: Independent     Functional Limitations Info  Sight,Hearing,Speech Sight Info: Impaired (eyeglasses) Hearing Info: Adequate Speech Info: Adequate    SPECIAL CARE FACTORS FREQUENCY  PT (By licensed PT)     PT Frequency: 1-2x week              Contractures Contractures Info: Not present    Additional Factors Info  Code Status,Allergies Code Status Info:  (DNR) Allergies Info:  (itching,rash)           Current Medications (12/14/2020):  This is the current hospital active medication list Current Facility-Administered Medications  Medication Dose  Route Frequency Provider Last Rate Last Admin  . acetaminophen (TYLENOL) suppository 650 mg  650 mg Rectal Q6H PRN [name], MD      . alum & mag hydroxide-simeth (MAALOX/MYLANTA) 200-200-20 MG/5ML suspension 30 mL  30 mL Oral Q6H PRN [name], MD      . diatrizoate meglumine-sodium (GASTROGRAFIN) 66-10 % solution 90 mL  90  mL Per NG tube Once [name], MD      . diazepam (VALIUM) injection 5 mg  5 mg Intravenous Q8H PRN Nicole Kindred A, DO      . heparin injection 5,000 Units  5,000 Units Subcutaneous Q8H [name], MD   5,000 Units at 12/14/20 1533  . hydrALAZINE (APRESOLINE) injection 5 mg  5 mg Intravenous Once [name], MD      . HYDROmorphone (DILAUDID) injection 0.5 mg  0.5 mg Intravenous Q2H PRN [name], MD   0.5 mg at 12/14/20 1541  . lactated ringers infusion   Intravenous Continuous Dwyane Dee, MD 75 mL/hr at 12/14/20 1537 New Bag at 12/14/20 1537  . lip balm (CARMEX) ointment 1 application  1 application Topical BID [name], MD   1 application at 47/82/95 [phone]  . magic mouthwash  15 mL Oral QID PRN [name], MD      . menthol-cetylpyridinium (CEPACOL) lozenge 3 mg  1 lozenge Oral PRN [name], MD      . methocarbamol (ROBAXIN) 1,000 mg in dextrose 5 % 100 mL IVPB  1,000 mg Intravenous Q6H PRN [name], MD 200 mL/hr at 12/14/20 1246 1,000 mg at 12/14/20 1246  . metoprolol tartrate (LOPRESSOR) injection 5 mg  5 mg Intravenous Q6H PRN [name], MD      . ondansetron [facility]) tablet 4 mg  4 mg Oral Q6H PRN [name], MD       Or  . ondansetron [facility]) injection 4 mg  4 mg Intravenous Q6H PRN [name], MD   4 mg at 12/14/20 1246  . phenol (CHLORASEPTIC) mouth spray 2 spray  2 spray Mouth/Throat PRN [name], MD      . prochlorperazine (COMPAZINE) injection 10 mg  10 mg Intravenous Q6H PRN Nicole Kindred A, DO   10 mg at 12/14/20 1534  . promethazine (PHENERGAN) 12.5 mg in sodium chloride 0.9 % 50 mL IVPB  12.5 mg Intravenous Q6H PRN Nicole Kindred A, DO      . sodium chloride flush (NS) 0.9 % injection 3 mL  3 mL Intravenous Q12H [name], MD   3 mL at 12/14/20 0935     Discharge Medications: Please see discharge summary for a list of discharge medications.  Relevant Imaging Results:  Relevant Lab  Results:   Additional Information ssn# [id number]  Dessa Phi, RN

## 2020-12-14 NOTE — Progress Notes (Signed)
PROGRESS NOTE    Jesse [name]   YTK:[id number]  DOB: 01/09/1928  PCP: [name], MD    DOA: 12/12/2020 LOS: 2   Brief Narrative   87 year old male with past medical history of hypertension, hyperlipidemia, RBBB, CAD s/p CABG, AAA s/p repair and s/p bilateral femoral aneurysm repair in '95, thoracic aneurysm, GI bleed, prostate cancer s/p XRT, bladder tumor s/p resection, distant repair of bowel obstruction, hernia repair with mesh who presented to the ED from home with lower abdominal pain, nausea and dry heaves since 8 PM last night.  Currently admits to bloating, abdominal distention and intermittent abdominal pain from spasms and belching but no vomiting.  Last BM was yesterday but small.    Found to have early or partial SBO with transition point along the ventral abdominal wall likely due to adhesions in the setting of prior abdominal surgery with no free air or free fluid, chronic massive bilateral internal iliac artery aneurysms but enlarged since 2019 with subsequent increased mass-effect on the sigmoid colon as it traverses the mid pelvis but no large bowel obstruction, otherwise chronic issues. on CT abdomen/pelvis.    Pt could not tolerate NG tube placement.   Admitted to hospitalist service with general surgery consulted.  522: Third attempt at NG tube placement with patient premedicated with Ativan was again unsuccessful. Abdomen remains distended but patient is passing gas and also had a bowel movement today.  Palliative consulted.    Assessment & Plan   Principal Problem:   SBO (small bowel obstruction) (HCC) Active Problems:   HLD (hyperlipidemia)   Essential hypertension   CAD, ARTERY BYPASS GRAFT   Iliac artery aneurysm, bilateral (HCC)   Aneurysm of abdominal vessel (HCC)   Small bowel obstruction secondary to adhesions -seems improving clinically.  Patient having flatulence and adequate bowel movement today, 5/22. --General surgery following, patient high  risk for any surgical intervention -- Unable to tolerate NG tube insertion -- Will have radiology attempt under fluoro, may have anatomical issue -- N.p.o. --IV fluids -- Supportive care: As needed antiemetics and pain control  Abnormal UA /pyuria -patient has no urinary symptoms.  Has history of bladder tumor resection which could explain the pyuria due to inflammation.  Defer antibiotics and monitor clinically.  Chronic massive bilateral internal iliac artery aneurysms with extensive vascular history -admitting hospitalist discussed with vascular surgeon Dr. Scot Dock.  No acute issues and no intervention or evaluation warranted this admission. -- Close outpatient follow-up  CAD status post CABG /hyperlipidemia -stable --Holding aspirin, Coreg, statin, fenofibrate due to n.p.o. status  Patient BMI: Body mass index is 23.73 kg/m.   DVT prophylaxis: heparin injection 5,000 Units Start: 12/12/20 0815   Diet:      Code Status: DNR    Subjective 12/14/20    Patient seen along with surgery PA today.  He is vomiting this AM and having belching and very uncomfortable.  He does not want to try NG tube again.  Discussed having radiology place under fluoro.   Disposition Plan & Communication   Status is: Inpatient  Remains inpatient appropriate because:IV treatments appropriate due to intensity of illness or inability to take PO   Dispo: The patient is from: Home              Anticipated d/c is to: Home              Patient currently is not medically stable to d/c.   Difficult to place patient No   Family  Communication: Daughter and granddaughter at bedside on rounds today   Consults, Procedures, Significant Events   Consultants:   General surgery  Palliative care  Procedures:   3 failed attempts at NG tube insertion  Antimicrobials:  Anti-infectives (From admission, onward)   Start     Dose/Rate Route Frequency Ordered Stop   12/12/20 0715  cefTRIAXone (ROCEPHIN) 1  g in sodium chloride 0.9 % 100 mL IVPB        1 g 200 mL/hr over 30 Minutes Intravenous  Once 12/12/20 0706 12/12/20 0841        Micro    Objective   Vitals:   12/13/20 2026 12/14/20 0118 12/14/20 0443 12/14/20 1227  BP: 131/90  (!) 146/94 (!) 164/88  Pulse: 71  74 68  Resp: 20   16  Temp: 99.4 F (37.4 C) 98.2 F (36.8 C) 97.9 F (36.6 C) 97.7 F (36.5 C)  TempSrc: Oral Oral Oral Oral  SpO2: 94%   95%  Weight:      Height:        Intake/Output Summary (Last 24 hours) at 12/14/2020 1711 Last data filed at 12/14/2020 1300 Gross per 24 hour  Intake 1399.06 ml  Output 475 ml  Net 924.06 ml   Filed Weights   12/12/20 0323  Weight: 79.4 kg    Physical Exam:  General exam: awake, alert, no acute distress but appears very uncomfortable Respiratory system: normal respiratory effort, on room air. Cardiovascular system: RRR, no pedal edema.   Gastrointestinal system: Distended abdomen with large ventral hernia, nontender, having frequent belching and hiccups and heaves. Central nervous system: A&O x4. no gross focal neurologic deficits, normal speech Psychiatry: normal mood, congruent affect, judgement and insight appear normal  Labs   Data Reviewed: I have personally reviewed following labs and imaging studies  CBC: Recent Labs  Lab 12/12/20 0338 12/13/20 0505 12/14/20 0451  WBC 8.6 9.7 8.7  HGB 12.9* 13.0 12.8*  HCT 40.5 40.7 40.2  MCV 92.3 92.5 93.5  PLT 232 241 778   Basic Metabolic Panel: Recent Labs  Lab 12/12/20 0338 12/13/20 0505 12/14/20 0451  NA 136 138 137  K 4.2 4.0 3.8  CL 105 104 102  CO2 24 27 29   GLUCOSE 122* 131* 120*  BUN 28* 27* 35*  CREATININE 0.88 1.14 0.92  CALCIUM 9.6 9.2 9.2   GFR: Estimated Creatinine Clearance: 58.6 mL/min (by C-G formula based on SCr of 0.92 mg/dL). Liver Function Tests: Recent Labs  Lab 12/12/20 0338  AST 15  ALT 14  ALKPHOS 38  BILITOT 1.1  PROT 7.0  ALBUMIN 4.1   Recent Labs  Lab  12/12/20 0338  LIPASE 29   No results for input(s): AMMONIA in the last 168 hours. Coagulation Profile: No results for input(s): INR, PROTIME in the last 168 hours. Cardiac Enzymes: No results for input(s): CKTOTAL, CKMB, CKMBINDEX, TROPONINI in the last 168 hours. BNP (last 3 results) No results for input(s): PROBNP in the last 8760 hours. HbA1C: No results for input(s): HGBA1C in the last 72 hours. CBG: No results for input(s): GLUCAP in the last 168 hours. Lipid Profile: No results for input(s): CHOL, HDL, LDLCALC, TRIG, CHOLHDL, LDLDIRECT in the last 72 hours. Thyroid Function Tests: No results for input(s): TSH, T4TOTAL, FREET4, T3FREE, THYROIDAB in the last 72 hours. Anemia Panel: No results for input(s): VITAMINB12, FOLATE, FERRITIN, TIBC, IRON, RETICCTPCT in the last 72 hours. Sepsis Labs: No results for input(s): PROCALCITON, LATICACIDVEN in the last 168 hours.  Recent Results (from the past 240 hour(s))  Urine culture     Status: None   Collection Time: 12/12/20  3:38 AM   Specimen: Urine, Random  Result Value Ref Range Status   Specimen Description   Final    URINE, RANDOM Performed at [facility] [address] [place], [place] [address]    Special Requests   Final    NONE Performed at [facility], [place] [address] [place], [place] [address]    Culture   Final    NO GROWTH Performed at [facility], [place] [address], [place], [place] [address]    Report Status 12/14/2020 FINAL  Final  SARS CORONAVIRUS 2 (TAT 6-24 HRS) Nasopharyngeal Nasopharyngeal Swab     Status: None   Collection Time: 12/12/20  7:36 AM   Specimen: Nasopharyngeal Swab  Result Value Ref Range Status   SARS Coronavirus 2 NEGATIVE NEGATIVE Final    Comment: (NOTE) SARS-CoV-2 target nucleic acids are NOT DETECTED.  The SARS-CoV-2 RNA is generally detectable in upper and lower respiratory specimens during the acute phase of infection.  Negative results do not preclude SARS-CoV-2 infection, do not rule out co-infections with other pathogens, and should not be used as the sole basis for treatment or other patient management decisions. Negative results must be combined with clinical observations, patient history, and epidemiological information. The expected result is Negative.  Fact Sheet for Patients: SugarRoll.be  Fact Sheet for Healthcare Providers: https://www.woods-mathews.com/  This test is not yet approved or cleared by the [place] FDA and  has been authorized for detection and/or diagnosis of SARS-CoV-2 by FDA under an Emergency Use Authorization (EUA). This EUA will remain  in effect (meaning this test can be used) for the duration of the COVID-19 declaration under Se ction 564(b)(1) of the Act, 21 U.S.C. section 360bbb-3(b)(1), unless the authorization is terminated or revoked sooner.  Performed at [facility], [place] [address], [place], [place] [address]       Imaging Studies   No results found.   Medications   Scheduled Meds: . diatrizoate meglumine-sodium  90 mL Per NG tube Once  . glycopyrrolate  0.4 mg Intravenous TID  . heparin  5,000 Units Subcutaneous Q8H  . hydrALAZINE  5 mg Intravenous Once  . lip balm  1 application Topical BID  . sodium chloride flush  3 mL Intravenous Q12H   Continuous Infusions: . lactated ringers 75 mL/hr at 12/14/20 1537  . methocarbamol (ROBAXIN) IV 1,000 mg (12/14/20 1246)  . promethazine (PHENERGAN) injection (IM or IVPB)         LOS: 2 days    Time spent: 30 minutes with greater than 50% spent at bedside and in coordination of care    Ezekiel Slocumb, DO [facility]  12/14/2020, 5:11 PM      If 7PM-7AM, please contact night-coverage. How to contact the [facility] Attending or Consulting provider [place] [facility] or covering provider during after hours [place], for this patient?    1. Check the  care team in [facility] and look for a) attending/consulting TRH provider listed and b) the [facility] team listed 2. Log into [email or web address] and use [place]'s universal password to access. If you do not have the password, please contact the hospital operator. 3. Locate the [facility] provider you are looking for under [facility] and page to a number that you can be directly reached. 4. If you still have difficulty reaching the  provider, please page the [name] (Director on Call) for the Hospitalists listed on amion for assistance.

## 2020-12-14 NOTE — TOC Initial Note (Signed)
Transition of Care [facility]) - Initial/Assessment Note    Patient Details  Name: Jesse [name] MRN: [id number] Date of Birth: November 01, 1927  [facility] CM/SW Contact:    Dessa Phi, RN Phone Number: 12/14/2020, 3:56 PM  Clinical Narrative: Cjonfirmed from [place] following for return. fl2 e faxed. PT/OT no f/u.Patient indep @ ALF.DNR,family plans no sx.                  Expected Discharge Plan: Assisted Living Barriers to Discharge: Continued Medical Work up   Patient Goals and CMS Choice Patient states their goals for this hospitalization and ongoing recovery are:: return bxk to [facility] ALF CMS Medicare.gov Compare Post Acute Care list provided to:: Patient Represenative (must comment) Choice offered to / list presented to : Adult Children  Expected Discharge Plan and Services Expected Discharge Plan: Assisted Living   Discharge Planning Services: CM Consult   Living arrangements for the past 2 months: [place]                                      Prior Living Arrangements/Services Living arrangements for the past 2 months: [place] Lives with:: Facility Resident Patient language and need for interpreter reviewed:: Yes Do you feel safe going back to the place where you live?: Yes      Need for Family Participation in Patient Care: No (Comment) Care giver support system in place?: Yes (comment) Current home services: DME (cane) Criminal Activity/Legal Involvement Pertinent to Current Situation/Hospitalization: No - Comment as needed  Activities of Daily Living Home Assistive Devices/Equipment: Other (Comment) (life alert device) ADL Screening (condition at time of admission) Patient's cognitive ability adequate to safely complete daily activities?: Yes Is the patient deaf or have difficulty hearing?: Yes Does the patient have difficulty seeing, even when wearing glasses/contacts?: No Does the  patient have difficulty concentrating, remembering, or making decisions?: No Patient able to express need for assistance with ADLs?: Yes Does the patient have difficulty dressing or bathing?: Yes Independently performs ADLs?: No Communication: Independent Dressing (OT): Needs assistance Is this a change from baseline?: Pre-admission baseline Grooming: Needs assistance Is this a change from baseline?: Pre-admission baseline Feeding: Independent Bathing: Needs assistance Is this a change from baseline?: Pre-admission baseline Toileting: Needs assistance Is this a change from baseline?: Pre-admission baseline In/Out Bed: Independent with device (comment) Walks in Home: Independent with device (comment) Does the patient have difficulty walking or climbing stairs?: Yes Weakness of Legs: Both Weakness of Arms/Hands: None  Permission Sought/Granted Permission sought to share information with : Case Manager Permission granted to share information with : Yes, Verbal Permission Granted  Share Information with NAME: Case Manager           Emotional Assessment Appearance:: Appears stated age Attitude/Demeanor/Rapport: Gracious Affect (typically observed): Accepting Orientation: : Oriented to Self,Oriented to Place,Oriented to  Time,Oriented to Situation Alcohol / Substance Use: Not Applicable Psych Involvement: No (comment)  Admission diagnosis:  Small bowel obstruction ([place]) [K56.609] SBO (small bowel obstruction) ([place]) [K56.609] Encounter for imaging study to confirm nasogastric (NG) tube placement [Z01.89] Acute cystitis with hematuria [N30.01] Patient Active Problem List   Diagnosis Date Noted  . SBO (small bowel obstruction) ([place]) 12/12/2020  . Abducens (sixth) nerve palsy, left 02/20/2020  . Binocular vision disorder with diplopia 02/20/2020  . Gross hematuria 08/15/2019  . History of prostate cancer  08/15/2019  . Anxiety 06/21/2019  . Anemia due to chronic illness  06/20/2019  . Pneumonia due to COVID-19 virus 06/20/2019  . Displaced spiral fracture of shaft of right femur, initial encounter for open fracture type I or II ([place]) 06/20/2019  . COVID-19 virus infection 06/19/2019  . Surgery, elective   . Hip fracture ([place]) 04/15/2019  . Fall   . Coronary artery disease involving native coronary artery of native heart without angina pectoris 11/20/2018  . Thoracic aortic aneurysm without rupture ([place]) 11/20/2018  . Chronic systolic heart failure ([place]) 11/20/2018  . Duodenal ulcer with hemorrhage 05/05/2018  . Hemorrhagic shock ([place]) 05/05/2018  . AKI (acute kidney injury) ([place]) 05/05/2018  . Orthostatic hypotension 05/04/2018  . Symptomatic anemia 05/04/2018  . Melena 05/04/2018  . GI bleed 05/04/2018  . Patella fracture 06/09/2015  . Aftercare following surgery of the circulatory system, [place] 01/08/2013  . Pain in limb- Left popliteal 12/25/2012  . Aneurysm artery, femoral ([place]) 05/22/2012  . 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 Aneurysm of abdominal vessel ([place]) 02/21/2012  . Iliac artery aneurysm, bilateral ([place]) 08/09/2011  . Aneurysm of artery of lower extremity ([place]) 08/09/2011  . HLD (hyperlipidemia) 01/16/2009  . Essential hypertension 01/16/2009  . CAD, ARTERY BYPASS GRAFT 01/16/2009  . PERIPHERAL VASCULAR DISEASE 01/16/2009  . ABDOMINAL AORTIC ANEURYSM REPAIR, HX OF 01/16/2009  . MIXED HYPERLIPIDEMIA 12/28/2008   PCP:  [name], MD Pharmacy:   [facility] [place], [place] [address] Phone: [phone] Fax: [phone]  [place], [place] [address] Phone: [phone] Fax: [place], [place] [address] [place] [address] Phone: [phone] Fax: [phone]     Social  Determinants of Health (SDOH) Interventions    Readmission Risk Interventions No flowsheet data found.

## 2020-12-15 ENCOUNTER — Inpatient Hospital Stay (HOSPITAL_COMMUNITY): Payer: Medicare Other

## 2020-12-15 DIAGNOSIS — Z515 Encounter for palliative care: Secondary | ICD-10-CM | POA: Diagnosis not present

## 2020-12-15 DIAGNOSIS — K56609 Unspecified intestinal obstruction, unspecified as to partial versus complete obstruction: Secondary | ICD-10-CM | POA: Diagnosis not present

## 2020-12-15 DIAGNOSIS — Z7189 Other specified counseling: Secondary | ICD-10-CM | POA: Diagnosis not present

## 2020-12-15 HISTORY — PX: IR NASO G TUBE PLC W/FL W/RAD: IMG2321

## 2020-12-15 LAB — BASIC METABOLIC PANEL
Anion gap: 8 (ref 5–15)
BUN: 36 mg/dL — ABNORMAL HIGH (ref 8–23)
CO2: 25 mmol/L (ref 22–32)
Calcium: 9 mg/dL (ref 8.9–10.3)
Chloride: 103 mmol/L (ref 98–111)
Creatinine, Ser: 0.83 mg/dL (ref 0.61–1.24)
GFR, Estimated: >60 mL/min (ref >60)
Glucose, Bld: 115 mg/dL — ABNORMAL HIGH (ref 70–99)
Potassium: 3.7 mmol/L (ref 3.5–5.1)
Sodium: 136 mmol/L (ref 135–145)

## 2020-12-15 LAB — CBC
HCT: 39 % (ref 39.0–52.0)
Hemoglobin: 12.5 g/dL — ABNORMAL LOW (ref 13.0–17.0)
MCH: 30 pg (ref 26.0–34.0)
MCHC: 32.1 g/dL (ref 30.0–36.0)
MCV: 93.5 fL (ref 80.0–100.0)
Platelets: 233 10*3/uL (ref 150–400)
RBC: 4.17 MIL/uL — ABNORMAL LOW (ref 4.22–5.81)
RDW: 13.5 % (ref 11.5–15.5)
WBC: 7.4 10*3/uL (ref 4.0–10.5)
nRBC: 0 % (ref 0.0–0.2)

## 2020-12-15 MED ORDER — ONDANSETRON HCL 4 MG/2ML IJ SOLN
4.0000 mg | Freq: Four times a day (QID) | INTRAMUSCULAR | Status: DC
  Administered 2020-12-15: 4 mg via INTRAVENOUS
  Filled 2020-12-15: qty 2

## 2020-12-15 MED ORDER — ONDANSETRON HCL 4 MG/2ML IJ SOLN: 4.0000 mg | Freq: Four times a day (QID) | INTRAMUSCULAR | Status: DC

## 2020-12-15 MED ORDER — SODIUM CHLORIDE 0.9 % IV SOLN
12.5000 mg | Freq: Four times a day (QID) | INTRAVENOUS | Status: DC
  Filled 2020-12-15 (×2): qty 0.5

## 2020-12-15 MED ORDER — ONDANSETRON HCL 4 MG/2ML IJ SOLN: 4.0000 mg | Freq: Four times a day (QID) | INTRAMUSCULAR | Status: DC | PRN

## 2020-12-15 MED ORDER — LIDOCAINE VISCOUS HCL 2 % MT SOLN
OROMUCOSAL | Status: AC
  Filled 2020-12-15: qty 15

## 2020-12-15 MED ORDER — SODIUM CHLORIDE 0.9 % IV SOLN
12.5000 mg | Freq: Four times a day (QID) | INTRAVENOUS | Status: DC | PRN
  Filled 2020-12-15: qty 0.5

## 2020-12-15 MED ORDER — LIDOCAINE VISCOUS HCL 2 % MT SOLN
OROMUCOSAL | Status: AC | PRN
  Administered 2020-12-15: 15 mL via ORAL

## 2020-12-15 MED ORDER — SODIUM CHLORIDE 0.9 % IV SOLN
12.5000 mg | Freq: Four times a day (QID) | INTRAVENOUS | Status: DC
  Filled 2020-12-15: qty 0.5

## 2020-12-15 MED ORDER — SODIUM CHLORIDE 0.9 % IV SOLN
12.5000 mg | Freq: Four times a day (QID) | INTRAVENOUS | Status: DC | PRN
  Administered 2020-12-16 – 2020-12-17 (×3): 12.5 mg via INTRAVENOUS
  Filled 2020-12-15 (×5): qty 0.5

## 2020-12-15 NOTE — Procedures (Addendum)
Interventional Radiology Procedure:   Indications: Small bowel obstruction and needs a nasogastric tube.  Failed NG tube placement on floor.  Procedure: NG tube placement with fluoroscopy  Findings: 16 Fr NG tube was preferentially going into trachea.  Eventually, NG tube was navigated into tortuous esophagus using fluoroscopy.  NGT in stomach at end of procedure.    Complications: None     EBL: Minimal  Plan: NG tube is ready to use.     Chrissi Crow R. Anselm Pancoast, MD  Pager: [phone]

## 2020-12-15 NOTE — Progress Notes (Signed)
Palliative:  HPI: 87 y.o.malewith past medical history of hypertension, hyperlipidemia, RBBB, CAD s/p CABG, AAA s/p repair and s/p femoral aneurysm repair, GIB, prostate cancer s/p radiation therapy, bladder tumor s/p resection, distant repair of bowel obstruction, hernia repair with meshadmitted on 5/21/2022with lower abd pain with nausea and dry heaves, bloating, abd distention related to small bowel obstruction.Did not tolerate NGT and now refuses replacement.  I met today at Jesse [name]'s bedside. He continues to be miserable and is weaker today. He has hiccups and unrelieved nausea. I discussed with him the option of having our interventional team help with sedation like you would get for endoscopy/colonoscopy to assist with placing tube in nose as previously attempted or surgical place in abdomen to give him relief. We discussed that this is not a guarantee that his obstruction will improve but it will give him relief and make him feel better. Jesse [name] is so miserable he agrees to anything that we recommend at this time. Mr. [name] clearly tells me today that he is not afraid to die but does not want to suffer. I encouraged him that today our goal is to get him relief. I spoke with Lennette Bihari, [place] in IR who will discuss options with Dr. Anselm Pancoast and follow up with how they can help Mr. [name].   I called and discussed recommendations with both children Webb Silversmith and [name]. [name] reports that he is HCPOA and wishes to be called first. I discussed further with [name] my concerns for his father. Discussed my conversation with IR and hopes they can intervene. [name] expresses some frustration as he feels that this should have been done Sunday for further intervention although I did explain that his father had a BM Sunday so we were not in this dire position as we are now. We discussed the goal to get Jesse [name] relief and give him time with conservative measures to allow his obstruction to resolve if  possible. [name] asks about timing before we are pressed to consider surgical intervention vs comfort care and I explained that there is no specific timeframe but just day by day based on his father's progress. We discussed that if he continues to decline or has further complications with worsening weakness, infection/pneumonia, or slow improvements. [name] agrees that he does not feel that his father wishes for surgery and he is supportive of this decision but does not want to completely take the option of surgery off the table yet. [name] does fully understand that there may be a decision in the near future for surgery vs comfort care.   Plan: - Continue Zofran and phenergan as needed.  - Thorazine as needed for hiccups.  - Continued scheduled Robinul to decrease secretions.  - Discussed with IR for tube placement for relief.   [place], NP Palliative Medicine Team Pager [phone] (Please see [email or web address] for schedule) Team Phone [phone]    Greater than 50%  of this time was spent counseling and coordinating care related to the above assessment and plan

## 2020-12-15 NOTE — Progress Notes (Signed)
[facility] Progress Note     Subjective: CC-  Uncomfortable this morning. Continues to have abdominal pain and nausea. States that he hasn't vomited this morning but he continues to dry heave. No flatus or BM. WBC 7.4, afebrile  Objective: Vital signs in last 24 hours: Temp:  [97.7 F (36.5 C)-98.6 F (37 C)] 98.6 F (37 C) (05/24 0439) Pulse Rate:  [66-70] 70 (05/24 0439) Resp:  [16-18] 18 (05/24 0439) BP: (138-164)/(79-88) 138/83 (05/24 0439) SpO2:  [92 %-95 %] 92 % (05/24 0439) Last BM Date: 12/13/20  Intake/Output from previous day: 05/23 0701 - 05/24 0700 In: 1342.3 [P.O.:60; I.V.:877.6; IV Piggyback:404.7] Out: 900 [Urine:900] Intake/Output this shift: Total I/O In: -  Out: 50 [Urine:50]  PE: Gen:  Alert, NAD but appears uncomfortable Pulm: rate and effort normal Abd: distended but soft, mild lower abdominal TTP without rebound or guarding, few BS heard  Lab Results:  Recent Labs    12/14/20 0451 12/15/20 0440  WBC 8.7 7.4  HGB 12.8* 12.5*  HCT 40.2 39.0  PLT 238 233   BMET Recent Labs    12/14/20 0451 12/15/20 0440  NA 137 136  K 3.8 3.7  CL 102 103  CO2 29 25  GLUCOSE 120* 115*  BUN 35* 36*  CREATININE 0.92 0.83  CALCIUM 9.2 9.0   PT/INR No results for input(s): LABPROT, INR in the last 72 hours. CMP     Component Value Date/Time   NA 136 12/15/2020 0440   NA 141 03/02/2017 0753   K 3.7 12/15/2020 0440   CL 103 12/15/2020 0440   CO2 25 12/15/2020 0440   GLUCOSE 115 (H) 12/15/2020 0440   GLUCOSE 100 (H) 05/05/2006 0839   BUN 36 (H) 12/15/2020 0440   BUN 26 03/02/2017 0753   CREATININE 0.83 12/15/2020 0440   CREATININE 0.99 03/10/2014 1445   CALCIUM 9.0 12/15/2020 0440   PROT 7.0 12/12/2020 0338   PROT 5.6 (L) 03/02/2017 0753   ALBUMIN 4.1 12/12/2020 0338   ALBUMIN 3.7 03/02/2017 0753   AST 15 12/12/2020 0338   ALT 14 12/12/2020 0338   ALKPHOS 38 12/12/2020 0338   BILITOT 1.1 12/12/2020 0338   BILITOT 0.6  03/02/2017 0753   GFRNONAA >60 12/15/2020 0440   GFRAA >60 08/20/2019 0438   Lipase     Component Value Date/Time   LIPASE 29 12/12/2020 0338       Studies/Results: No results found.  Anti-infectives: Anti-infectives (From admission, onward)   Start     Dose/Rate Route Frequency Ordered Stop   12/12/20 0715  cefTRIAXone (ROCEPHIN) 1 g in sodium chloride 0.9 % 100 mL IVPB        1 g 200 mL/hr over 30 Minutes Intravenous  Once 12/12/20 0706 12/12/20 0841       Assessment/Plan CAD s/p CABG Bilateral internal iliac artery aneurysms  Prior AAA repair HTN HLD RBBB Prostate cancer s/p XRT  Recurrent SBO - Appreciate palliative assistance, they are scheduling antinausea medications and speaking with IR about possibility of conscious sedation for NG tube placement. Patient agreeable to attempt this morning. Keep NPO.  Patient would be very high risk with any operative intervention, hopefully we can avoid this.  ID - rocephin 5/21 x1 FEN - IVF, NPO VTE - SCDs, sq heparin Foley - none   LOS: 3 days    Wellington Hampshire, [facility] Surgery 12/15/2020, 9:35 AM Please see Amion for pager number during day hours 7:00am-4:30pm

## 2020-12-15 NOTE — Progress Notes (Signed)
PROGRESS NOTE    Jesse [name]   IFO:[id number]  DOB: 03-Sep-1927  PCP: [name], MD    DOA: 12/12/2020 LOS: 3   Brief Narrative   87 year old male with past medical history of hypertension, hyperlipidemia, RBBB, CAD s/p CABG, AAA s/p repair and s/p bilateral femoral aneurysm repair in '95, thoracic aneurysm, GI bleed, prostate cancer s/p XRT, bladder tumor s/p resection, distant repair of bowel obstruction, hernia repair with mesh who presented to the ED from home with lower abdominal pain, nausea and dry heaves since 8 PM last night.  Currently admits to bloating, abdominal distention and intermittent abdominal pain from spasms and belching but no vomiting.  Last BM was yesterday but small.    Found to have early or partial SBO with transition point along the ventral abdominal wall likely due to adhesions in the setting of prior abdominal surgery with no free air or free fluid, chronic massive bilateral internal iliac artery aneurysms but enlarged since 2019 with subsequent increased mass-effect on the sigmoid colon as it traverses the mid pelvis but no large bowel obstruction, otherwise chronic issues. on CT abdomen/pelvis.    Pt could not tolerate NG tube placement.   Admitted to hospitalist service with general surgery consulted.  522: Third attempt at NG tube placement with patient premedicated with Ativan was again unsuccessful. Abdomen remains distended but patient is passing gas and also had a bowel movement today.  Palliative consulted.    Assessment & Plan   Principal Problem:   SBO (small bowel obstruction) (HCC) Active Problems:   HLD (hyperlipidemia)   Essential hypertension   CAD, ARTERY BYPASS GRAFT   Iliac artery aneurysm, bilateral (HCC)   Aneurysm of abdominal vessel (HCC)   Small bowel obstruction secondary to adhesions -seems improving clinically.  Patient having flatulence and adequate bowel movement today, 5/22. --General surgery following, patient high  risk for any surgical intervention --Palliative care following, appreciate assistance -- Unable to tolerate NG tube insertion --G-tube placement by IR requested --Placed G-tube to suction  --Clear liquids okay by mouth once G-tube to suction -- IV fluids -- Supportive care: antiemetics and pain control  Abnormal UA /pyuria -patient has no urinary symptoms.  Has history of bladder tumor resection which could explain the pyuria due to inflammation.  Defer antibiotics and monitor clinically.  Chronic massive bilateral internal iliac artery aneurysms with extensive vascular history -admitting hospitalist discussed with vascular surgeon Dr. Scot Dock.  No acute issues and no intervention or evaluation warranted this admission. -- Close outpatient follow-up  CAD status post CABG /hyperlipidemia -stable --Holding aspirin, Coreg, statin, fenofibrate due to n.p.o. status  Patient BMI: Body mass index is 23.73 kg/m.   DVT prophylaxis: heparin injection 5,000 Units Start: 12/12/20 0815   Diet:      Code Status: DNR    Subjective 12/15/20    Patient seen this morning at bedside.  He has been miserable with nausea vomiting.  No improvement, feels worse.  Discussed with patient the idea of a G-tube instead of NG to for relief of his symptoms and ability to connect to suction for his obstruction.  He is agreeable.   Disposition Plan & Communication   Status is: Inpatient  Remains inpatient appropriate because:IV treatments appropriate due to intensity of illness or inability to take PO   Dispo: The patient is from: Home              Anticipated d/c is to: Home  Patient currently is not medically stable to d/c.   Difficult to place patient No   Family Communication: Daughter and granddaughter at bedside on rounds today   Consults, Procedures, Significant Events   Consultants:   General surgery  Palliative care  Procedures:   3 failed attempts at NG tube  insertion  Antimicrobials:  Anti-infectives (From admission, onward)   Start     Dose/Rate Route Frequency Ordered Stop   12/12/20 0715  cefTRIAXone (ROCEPHIN) 1 g in sodium chloride 0.9 % 100 mL IVPB        1 g 200 mL/hr over 30 Minutes Intravenous  Once 12/12/20 0706 12/12/20 0841        Micro    Objective   Vitals:   12/14/20 1227 12/14/20 2059 12/15/20 0439 12/15/20 1421  BP: (!) 164/88 139/79 138/83 131/81  Pulse: 68 66 70 76  Resp: 16 18 18 18   Temp: 97.7 F (36.5 C) 98.3 F (36.8 C) 98.6 F (37 C) 98 F (36.7 C)  TempSrc: Oral Oral Oral Oral  SpO2: 95% 93% 92% 93%  Weight:      Height:        Intake/Output Summary (Last 24 hours) at 12/15/2020 1637 Last data filed at 12/15/2020 1600 Gross per 24 hour  Intake 1801.37 ml  Output 3925 ml  Net -2123.63 ml   Filed Weights   12/12/20 0323  Weight: 79.4 kg    Physical Exam:  General exam: awake, alert, appears very uncomfortable  Respiratory system: CTAB, normal respiratory effort, on room air. Cardiovascular system: RRR, no pedal edema.   Gastrointestinal system: Abdomen remains distended, large ventral hernia and surgical scars.  Continues to have frequent hiccups and belches, also dry heaving. Central nervous system: A&O x4. no gross focal neurologic deficits, normal speech Psychiatry: normal mood, congruent affect, judgement and insight appear normal  Labs   Data Reviewed: I have personally reviewed following labs and imaging studies  CBC: Recent Labs  Lab 12/12/20 0338 12/13/20 0505 12/14/20 0451 12/15/20 0440  WBC 8.6 9.7 8.7 7.4  HGB 12.9* 13.0 12.8* 12.5*  HCT 40.5 40.7 40.2 39.0  MCV 92.3 92.5 93.5 93.5  PLT 232 241 238 628   Basic Metabolic Panel: Recent Labs  Lab 12/12/20 0338 12/13/20 0505 12/14/20 0451 12/15/20 0440  NA 136 138 137 136  K 4.2 4.0 3.8 3.7  CL 105 104 102 103  CO2 24 27 29 25   GLUCOSE 122* 131* 120* 115*  BUN 28* 27* 35* 36*  CREATININE 0.88 1.14 0.92 0.83   CALCIUM 9.6 9.2 9.2 9.0   GFR: Estimated Creatinine Clearance: 64.9 mL/min (by C-G formula based on SCr of 0.83 mg/dL). Liver Function Tests: Recent Labs  Lab 12/12/20 0338  AST 15  ALT 14  ALKPHOS 38  BILITOT 1.1  PROT 7.0  ALBUMIN 4.1   Recent Labs  Lab 12/12/20 0338  LIPASE 29   No results for input(s): AMMONIA in the last 168 hours. Coagulation Profile: No results for input(s): INR, PROTIME in the last 168 hours. Cardiac Enzymes: No results for input(s): CKTOTAL, CKMB, CKMBINDEX, TROPONINI in the last 168 hours. BNP (last 3 results) No results for input(s): PROBNP in the last 8760 hours. HbA1C: No results for input(s): HGBA1C in the last 72 hours. CBG: No results for input(s): GLUCAP in the last 168 hours. Lipid Profile: No results for input(s): CHOL, HDL, LDLCALC, TRIG, CHOLHDL, LDLDIRECT in the last 72 hours. Thyroid Function Tests: No results for input(s): TSH, T4TOTAL,  FREET4, T3FREE, THYROIDAB in the last 72 hours. Anemia Panel: No results for input(s): VITAMINB12, FOLATE, FERRITIN, TIBC, IRON, RETICCTPCT in the last 72 hours. Sepsis Labs: No results for input(s): PROCALCITON, LATICACIDVEN in the last 168 hours.  Recent Results (from the past 240 hour(s))  Urine culture     Status: None   Collection Time: 12/12/20  3:38 AM   Specimen: Urine, Random  Result Value Ref Range Status   Specimen Description   Final    URINE, RANDOM Performed at [facility] [address], [place], [place] [address]    Special Requests   Final    NONE Performed at [facility], [place] [address], [place], [place] [address]    Culture   Final    NO GROWTH Performed at [facility], [place] [address], [place], [place] [address]    Report Status 12/14/2020 FINAL  Final  SARS CORONAVIRUS 2 (TAT 6-24 HRS) Nasopharyngeal Nasopharyngeal Swab     Status: None   Collection Time: 12/12/20  7:36 AM   Specimen: Nasopharyngeal Swab  Result  Value Ref Range Status   SARS Coronavirus 2 NEGATIVE NEGATIVE Final    Comment: (NOTE) SARS-CoV-2 target nucleic acids are NOT DETECTED.  The SARS-CoV-2 RNA is generally detectable in upper and lower respiratory specimens during the acute phase of infection. Negative results do not preclude SARS-CoV-2 infection, do not rule out co-infections with other pathogens, and should not be used as the sole basis for treatment or other patient management decisions. Negative results must be combined with clinical observations, patient history, and epidemiological information. The expected result is Negative.  Fact Sheet for Patients: SugarRoll.be  Fact Sheet for Healthcare Providers: https://www.woods-mathews.com/  This test is not yet approved or cleared by the [place] FDA and  has been authorized for detection and/or diagnosis of SARS-CoV-2 by FDA under an Emergency Use Authorization (EUA). This EUA will remain  in effect (meaning this test can be used) for the duration of the COVID-19 declaration under Se ction 564(b)(1) of the Act, 21 U.S.C. section 360bbb-3(b)(1), unless the authorization is terminated or revoked sooner.  Performed at [facility], [place] [address] [place], [place] [address]       Imaging Studies   IR [facility] W/FL W/Rad  Result Date: 12/15/2020 CLINICAL DATA:  87 year old with small bowel obstruction and needs a nasogastric tube. Nasogastric tube could not be placed on the floor. EXAM: PLACEMENT OF NASOGASTRIC TUBE WITH FLUOROSCOPY ANESTHESIA/SEDATION: None MEDICATIONS: Viscous lidocaine CONTRAST:  None PROCEDURE: Viscous lidocaine was placed in the right nostril. 16 French nasogastric tube was advanced easily through the right nostril. The tube was preferentially going into the airway based on fluoroscopy. Using a lateral projection, the tube was successfully advanced into the proximal esophagus. Tube was  advanced into the stomach. The tube was secured to the patient's nose. COMPLICATIONS: None immediate FINDINGS: Nasogastric tube was preferentially going into the airway. Nasogastric tube was successfully navigated into the esophagus using fluoroscopy. Nasogastric tube tip was placed in the stomach at the end of the procedure. Fluoroscopic images were taken and saved for this procedure. IMPRESSION: Successful placement of a nasogastric tube with fluoroscopy. Electronically Signed   By: Markus Daft M.D.   On: 12/15/2020 14:53     Medications   Scheduled Meds: . diatrizoate meglumine-sodium  90 mL Per NG tube Once  . glycopyrrolate  0.4 mg Intravenous TID  . heparin  5,000 Units Subcutaneous  Q8H  . hydrALAZINE  5 mg Intravenous Once  . lidocaine      . lidocaine      . lip balm  1 application Topical BID  . sodium chloride flush  3 mL Intravenous Q12H   Continuous Infusions: . chlorproMAZINE (THORAZINE) IV    . lactated ringers 75 mL/hr at 12/15/20 1613  . methocarbamol (ROBAXIN) IV 1,000 mg (12/15/20 1012)  . promethazine (PHENERGAN) injection (IM or IVPB)         LOS: 3 days    Time spent: 25 minutes with greater than 50% spent at bedside and in coordination of care    Ezekiel Slocumb, DO [facility]  12/15/2020, 4:37 PM      If 7PM-7AM, please contact night-coverage. How to contact the [facility] Attending or Consulting provider [place] or covering provider during after hours [place], for this patient?    1. Check the care team in [facility] and look for a) attending/consulting TRH provider listed and b) the [facility] team listed 2. Log into [email or web address] and use 's universal password to access. If you do not have the password, please contact the hospital operator. 3. Locate the [facility] provider you are looking for under [facility] and page to a number that you can be directly reached. 4. If you still have difficulty reaching the provider, please page the [facility] (Director  on Call) for the Hospitalists listed on amion for assistance.

## 2020-12-16 DIAGNOSIS — K56609 Unspecified intestinal obstruction, unspecified as to partial versus complete obstruction: Secondary | ICD-10-CM | POA: Diagnosis not present

## 2020-12-16 LAB — BASIC METABOLIC PANEL
Anion gap: 7 (ref 5–15)
BUN: 41 mg/dL — ABNORMAL HIGH (ref 8–23)
CO2: 27 mmol/L (ref 22–32)
Calcium: 8.7 mg/dL — ABNORMAL LOW (ref 8.9–10.3)
Chloride: 103 mmol/L (ref 98–111)
Creatinine, Ser: 0.69 mg/dL (ref 0.61–1.24)
GFR, Estimated: >60 mL/min (ref >60)
Glucose, Bld: 92 mg/dL (ref 70–99)
Potassium: 3.4 mmol/L — ABNORMAL LOW (ref 3.5–5.1)
Sodium: 137 mmol/L (ref 135–145)

## 2020-12-16 LAB — CBC
HCT: 35.1 % — ABNORMAL LOW (ref 39.0–52.0)
Hemoglobin: 11.2 g/dL — ABNORMAL LOW (ref 13.0–17.0)
MCH: 29.9 pg (ref 26.0–34.0)
MCHC: 31.9 g/dL (ref 30.0–36.0)
MCV: 93.9 fL (ref 80.0–100.0)
Platelets: 218 10*3/uL (ref 150–400)
RBC: 3.74 MIL/uL — ABNORMAL LOW (ref 4.22–5.81)
RDW: 13.5 % (ref 11.5–15.5)
WBC: 4.6 10*3/uL (ref 4.0–10.5)
nRBC: 0 % (ref 0.0–0.2)

## 2020-12-16 MED ORDER — LIDOCAINE 5 % EX PTCH
1.0000 | MEDICATED_PATCH | CUTANEOUS | Status: DC
  Administered 2020-12-16 – 2020-12-24 (×9): 1 via TRANSDERMAL
  Filled 2020-12-16 (×11): qty 1

## 2020-12-16 MED ORDER — POTASSIUM CHLORIDE 10 MEQ/100ML IV SOLN
10.0000 meq | INTRAVENOUS | Status: AC
  Administered 2020-12-16 (×4): 10 meq via INTRAVENOUS
  Filled 2020-12-16 (×4): qty 100

## 2020-12-16 MED ORDER — CHLORHEXIDINE GLUCONATE 0.12 % MT SOLN
15.0000 mL | Freq: Two times a day (BID) | OROMUCOSAL | Status: DC
  Administered 2020-12-16 – 2020-12-25 (×19): 15 mL via OROMUCOSAL
  Filled 2020-12-16 (×18): qty 15

## 2020-12-16 MED ORDER — ACETAMINOPHEN 650 MG RE SUPP
650.0000 mg | RECTAL | Status: DC | PRN
  Administered 2020-12-16: 650 mg via RECTAL

## 2020-12-16 NOTE — Evaluation (Signed)
Physical Therapy Evaluation Patient Details Name: Jesse [name] MRN: [id number] DOB: 12/16/1927 Today's Date: 12/16/2020   History of Present Illness  Patient admitted with SBO. Multiple attempts failed for NG tube placement and subsequently it was placed by IR on 12/15/2020.  This is a 87 year old male with past medical history of hypertension, hyperlipidemia, RBBB, CAD s/p CABG, AAA s/p repair and s/p bilateral femoral aneurysm repair in '95, thoracic aneurysm, GI bleed, prostate cancer s/p XRT, bladder tumor s/p resection, distant repair of bowel obstruction, hernia repair with mesh who presented to the ED from home with lower abdominal pain, nausea and dry heaves  Clinical Impression  Pt admitted with above diagnosis.  Pt currently with functional limitations due to the deficits listed below (see PT Problem List). Pt will benefit from skilled PT to increase their independence and safety with mobility to allow discharge to the venue listed below.  Pt reports right "sciatica" pain and points to buttock area.  Pt assisted with ambulating and unable to tolerate his typical distance due to pain.  Pt sitting with feet down in recliner on arrival so after ambulating, returned to bed and provided ice pack.      Follow Up Recommendations Home health PT    Equipment Recommendations  None recommended by PT    Recommendations for Other Services       Precautions / Restrictions Precautions Precautions: Fall Precaution Comments: reports history of falls      Mobility  Bed Mobility Overal bed mobility: Needs Assistance Bed Mobility: Sit to Supine       Sit to supine: Min assist   General bed mobility comments: assist for legs due to pain    Transfers Overall transfer level: Needs assistance Equipment used: Rolling walker (2 wheeled) Transfers: Sit to/from Stand Sit to Stand: Supervision         General transfer comment: supervision for safety due to  pain  Ambulation/Gait Ambulation/Gait assistance: Min guard Gait Distance (Feet): 120 Feet Assistive device: Rolling walker (2 wheeled) Gait Pattern/deviations: Step-through pattern;Decreased stride length     General Gait Details: verbal cues for weight bearing through UEs to assist with taking weight off Rt LE, pt reports being limited by pain and distressed he is not able to ambulate as far as he has been  Financial trader [employer] (Stroke Patients Only)       Balance                                             Pertinent Vitals/Pain Pain Assessment: Faces Faces Pain Scale: Hurts even more Pain Location: right buttock ("sciatica") Pain Descriptors / Indicators: Sharp Pain Intervention(s): Repositioned;Monitored during session;Ice applied (had heat on arrival)    Home Living Family/patient expects to be discharged to:: Assisted living               Home Equipment: Gilford Rile - 2 wheels;Cane - single point      Prior Function Level of Independence: Independent with assistive device(s)         Comments: Independent with ADLs. has supervision for bathing. Ambulates with cane or walker.     Hand Dominance   Dominant Hand: Right    Extremity/Trunk Assessment        Lower Extremity Assessment Lower Extremity Assessment: Generalized  weakness;RLE deficits/detail RLE Deficits / Details: pt reports "sciatica" pain limiting mobility, able to use functionally just painful       Communication   Communication: HOH  Cognition Arousal/Alertness: Awake/alert Behavior During Therapy: WFL for tasks assessed/performed Overall Cognitive Status: Within Functional Limits for tasks assessed                                        General Comments      Exercises     Assessment/Plan    PT Assessment Patient needs continued PT services  PT Problem List Decreased mobility;Decreased activity  tolerance;Decreased knowledge of use of DME;Pain       PT Treatment Interventions Gait training;DME instruction;Therapeutic exercise;Functional mobility training;Therapeutic activities;Patient/family education    PT Goals (Current goals can be found in the Care Plan section)  Acute Rehab PT Goals PT Goal Formulation: With patient Time For Goal Achievement: 12/30/20 Potential to Achieve Goals: Good    Frequency Min 3X/week   Barriers to discharge        Co-evaluation               AM-PAC PT "6 Clicks" Mobility  Outcome Measure Help needed turning from your back to your side while in a flat bed without using bedrails?: A Little Help needed moving from lying on your back to sitting on the side of a flat bed without using bedrails?: A Little Help needed moving to and from a bed to a chair (including a wheelchair)?: A Little Help needed standing up from a chair using your arms (e.g., wheelchair or bedside chair)?: A Little Help needed to walk in hospital room?: A Little Help needed climbing 3-5 steps with a railing? : A Lot 6 Click Score: 17    End of Session Equipment Utilized During Treatment: Gait belt Activity Tolerance: Patient limited by pain Patient left: in bed;with call bell/phone within reach;with nursing/sitter in room   PT Visit Diagnosis: Other abnormalities of gait and mobility (R26.89)    Time: 5750-5183 PT Time Calculation (min) (ACUTE ONLY): 17 min   Charges:   PT Evaluation $PT Eval Low Complexity: 1 Low         Kati PT, DPT Acute Rehabilitation Services Pager: [phone] Office: [phone]  Trena Platt 12/16/2020, 3:23 PM

## 2020-12-16 NOTE — Progress Notes (Signed)
PROGRESS NOTE    Jesse [name]  YTK:[id number] DOB: 1928-01-09 DOA: 12/12/2020 PCP: [name], MD   Brief Narrative:   87 year old male with past medical history of hypertension, hyperlipidemia,RBBB,CAD s/p CABG, AAA s/p repair and s/p bilateral femoral aneurysm repair in '95, thoracic aneurysm, GI bleed,prostate cancer s/p XRT, bladder tumor s/p resection,distant repair of bowel obstruction, hernia repair with mesh who presented to the ED from home with lower abdominal pain and nausea and was Found to have early or partial SBO with transition point along the ventral abdominal wall likely due to adhesions in the setting of prior abdominal surgery with no free air or free fluid, chronic massive bilateral internal iliac artery aneurysms but enlarged since 2019 with subsequent increased mass-effect on the sigmoid colon as it traverses the mid pelvis but no large bowel obstruction, otherwise chronic issues based on the CT abdomen and pelvis.  Patient admitted to hospitalist service with general surgery consulted.  Multiple attempts failed for NG tube placement and subsequently it was placed by IR on 12/15/2020.   Assessment & Plan:   Principal Problem:   SBO (small bowel obstruction) (HCC) Active Problems:   HLD (hyperlipidemia)   Essential hypertension   CAD, ARTERY BYPASS GRAFT   Iliac artery aneurysm, bilateral (HCC)   Aneurysm of abdominal vessel (HCC)    Small bowel obstruction secondary to adhesions: Reportedly had 4.1 L recorded output from NG tube.  Patient feels much better.  Passing flatus.  No bowel movement.  Surgery following.  Management per them.   Abnormal UA /pyuria -patient has no urinary symptoms.  Has history of bladder tumor resection which could explain the pyuria due to inflammation.  Defer antibiotics and monitor clinically.  Chronic massive bilateral internal iliac artery aneurysms with extensive vascular history -admitting hospitalist discussed with  vascular surgeon Dr. Scot Dock.  No acute issues and no intervention or evaluation warranted this admission. -- Close outpatient follow-up  Hypokalemia: Replace.  Recheck in the morning.  CAD status post CABG /hyperlipidemia -stable --Holding aspirin, Coreg, statin, fenofibrate due to n.p.o. status  Patient BMI: Body mass index is 23.73 kg/m.   DVT prophylaxis: heparin injection 5,000 Units Start: 12/12/20 0815   Code Status: DNR  Family Communication:  None present at bedside.  Plan of care discussed with patient in length and he verbalized understanding and agreed with it.  Status is: Inpatient  Remains inpatient appropriate because:Inpatient level of care appropriate due to severity of illness   Dispo: The patient is from: Home              Anticipated d/c is to: Home              Patient currently is not medically stable to d/c.   Difficult to place patient No        Estimated body mass index is 23.73 kg/m as calculated from the following:   Height as of this encounter: 6' (1.829 m).   Weight as of this encounter: 79.4 kg.      Nutritional status:               Consultants:   General surgery  Palliative care  Procedures:   Plan  Antimicrobials:  Anti-infectives (From admission, onward)   Start     Dose/Rate Route Frequency Ordered Stop   12/12/20 0715  cefTRIAXone (ROCEPHIN) 1 g in sodium chloride 0.9 % 100 mL IVPB        1 g 200 mL/hr over 30 Minutes Intravenous  Once 12/12/20 0706 12/12/20 0841         Subjective: Seen and examined.  Feels much better.  No nausea.  Passing flatus.  No BM yet.  Objective: Vitals:   12/15/20 1421 12/15/20 2028 12/16/20 0609 12/16/20 1239  BP: 131/81 116/85 127/66 127/80  Pulse: 76 73 60 72  Resp: 18 18 18 18   Temp: 98 F (36.7 C) 98.2 F (36.8 C) 97.8 F (36.6 C) (!) 97.5 F (36.4 C)  TempSrc: Oral Oral Oral Oral  SpO2: 93% 94% 94% 95%  Weight:      Height:        Intake/Output Summary  (Last 24 hours) at 12/16/2020 1402 Last data filed at 12/16/2020 1355 Gross per 24 hour  Intake 912 ml  Output 2050 ml  Net -1138 ml   Filed Weights   12/12/20 0323  Weight: 79.4 kg    Examination:  General exam: Appears calm and comfortable  Respiratory system: Clear to auscultation. Respiratory effort normal. Cardiovascular system: S1 & S2 heard, RRR. No JVD, murmurs, rubs, gallops or clicks. No pedal edema. Gastrointestinal system: Abdomen is slightly distended but soft and nontender. No organomegaly or masses felt.  Absent bowel sounds. Central nervous system: Alert and oriented. No focal neurological deficits. Extremities: Symmetric 5 x 5 power. Skin: No rashes, lesions or ulcers Psychiatry: Judgement and insight appear normal. Mood & affect appropriate.    Data Reviewed: I have personally reviewed following labs and imaging studies  CBC: Recent Labs  Lab 12/12/20 0338 12/13/20 0505 12/14/20 0451 12/15/20 0440 12/16/20 0445  WBC 8.6 9.7 8.7 7.4 4.6  HGB 12.9* 13.0 12.8* 12.5* 11.2*  HCT 40.5 40.7 40.2 39.0 35.1*  MCV 92.3 92.5 93.5 93.5 93.9  PLT 232 241 238 233 829   Basic Metabolic Panel: Recent Labs  Lab 12/12/20 0338 12/13/20 0505 12/14/20 0451 12/15/20 0440 12/16/20 0445  NA 136 138 137 136 137  K 4.2 4.0 3.8 3.7 3.4*  CL 105 104 102 103 103  CO2 24 27 29 25 27   GLUCOSE 122* 131* 120* 115* 92  BUN 28* 27* 35* 36* 41*  CREATININE 0.88 1.14 0.92 0.83 0.69  CALCIUM 9.6 9.2 9.2 9.0 8.7*   GFR: Estimated Creatinine Clearance: 67.4 mL/min (by C-G formula based on SCr of 0.69 mg/dL). Liver Function Tests: Recent Labs  Lab 12/12/20 0338  AST 15  ALT 14  ALKPHOS 38  BILITOT 1.1  PROT 7.0  ALBUMIN 4.1   Recent Labs  Lab 12/12/20 0338  LIPASE 29   No results for input(s): AMMONIA in the last 168 hours. Coagulation Profile: No results for input(s): INR, PROTIME in the last 168 hours. Cardiac Enzymes: No results for input(s): CKTOTAL, CKMB,  CKMBINDEX, TROPONINI in the last 168 hours. BNP (last 3 results) No results for input(s): PROBNP in the last 8760 hours. HbA1C: No results for input(s): HGBA1C in the last 72 hours. CBG: No results for input(s): GLUCAP in the last 168 hours. Lipid Profile: No results for input(s): CHOL, HDL, LDLCALC, TRIG, CHOLHDL, LDLDIRECT in the last 72 hours. Thyroid Function Tests: No results for input(s): TSH, T4TOTAL, FREET4, T3FREE, THYROIDAB in the last 72 hours. Anemia Panel: No results for input(s): VITAMINB12, FOLATE, FERRITIN, TIBC, IRON, RETICCTPCT in the last 72 hours. Sepsis Labs: No results for input(s): PROCALCITON, LATICACIDVEN in the last 168 hours.  Recent Results (from the past 240 hour(s))  Urine culture     Status: None   Collection Time: 12/12/20  3:38 AM  Specimen: Urine, Random  Result Value Ref Range Status   Specimen Description   Final    URINE, RANDOM Performed at [facility] [address], [place], [place] [address]    Special Requests   Final    NONE Performed at [facility], [place] [address] [place], [place] [address]    Culture   Final    NO GROWTH Performed at [facility], [place] [address], [place], [place] [address]    Report Status 12/14/2020 FINAL  Final  SARS CORONAVIRUS 2 (TAT 6-24 HRS) Nasopharyngeal Nasopharyngeal Swab     Status: None   Collection Time: 12/12/20  7:36 AM   Specimen: Nasopharyngeal Swab  Result Value Ref Range Status   SARS Coronavirus 2 NEGATIVE NEGATIVE Final    Comment: (NOTE) SARS-CoV-2 target nucleic acids are NOT DETECTED.  The SARS-CoV-2 RNA is generally detectable in upper and lower respiratory specimens during the acute phase of infection. Negative results do not preclude SARS-CoV-2 infection, do not rule out co-infections with other pathogens, and should not be used as the sole basis for treatment or other patient management decisions. Negative results must be combined  with clinical observations, patient history, and epidemiological information. The expected result is Negative.  Fact Sheet for Patients: SugarRoll.be  Fact Sheet for Healthcare Providers: https://www.woods-mathews.com/  This test is not yet approved or cleared by the [place] FDA and  has been authorized for detection and/or diagnosis of SARS-CoV-2 by FDA under an Emergency Use Authorization (EUA). This EUA will remain  in effect (meaning this test can be used) for the duration of the COVID-19 declaration under Se ction 564(b)(1) of the Act, 21 U.S.C. section 360bbb-3(b)(1), unless the authorization is terminated or revoked sooner.  Performed at [facility], [place] [address], [place], [place] [address]       Radiology Studies: IR [facility] W/FL W/Rad  Result Date: 12/15/2020 CLINICAL DATA:  87 year old with small bowel obstruction and needs a nasogastric tube. Nasogastric tube could not be placed on the floor. EXAM: PLACEMENT OF NASOGASTRIC TUBE WITH FLUOROSCOPY ANESTHESIA/SEDATION: None MEDICATIONS: Viscous lidocaine CONTRAST:  None PROCEDURE: Viscous lidocaine was placed in the right nostril. 16 French nasogastric tube was advanced easily through the right nostril. The tube was preferentially going into the airway based on fluoroscopy. Using a lateral projection, the tube was successfully advanced into the proximal esophagus. Tube was advanced into the stomach. The tube was secured to the patient's nose. COMPLICATIONS: None immediate FINDINGS: Nasogastric tube was preferentially going into the airway. Nasogastric tube was successfully navigated into the esophagus using fluoroscopy. Nasogastric tube tip was placed in the stomach at the end of the procedure. Fluoroscopic images were taken and saved for this procedure. IMPRESSION: Successful placement of a nasogastric tube with fluoroscopy. Electronically Signed   By: Markus Daft M.D.    On: 12/15/2020 14:53    Scheduled Meds: . chlorhexidine  15 mL Mouth Rinse BID  . diatrizoate meglumine-sodium  90 mL Per NG tube Once  . glycopyrrolate  0.4 mg Intravenous TID  . heparin  5,000 Units Subcutaneous Q8H  . hydrALAZINE  5 mg Intravenous Once  . lip balm  1 application Topical BID  . sodium chloride flush  3 mL Intravenous Q12H   Continuous Infusions: . chlorproMAZINE (THORAZINE) IV Stopped (12/16/20 0135)  . methocarbamol (ROBAXIN) IV 1,000 mg (12/15/20 1012)  . potassium chloride 10 mEq (12/16/20 1355)  . promethazine (PHENERGAN) injection (IM  or IVPB)       LOS: 4 days   Time spent: 30 minutes   Darliss Cheney, MD [facility]  12/16/2020, 2:02 PM   How to contact the [facility] Attending or Consulting provider [place] or covering provider during after hours [place], for this patient?  1. Check the care team in [facility] and look for a) attending/consulting [facility] provider listed and b) the [place] team listed. Page or secure chat [phone]. 2. Log into [email or web address] and use [place]'s universal password to access. If you do not have the password, please contact the hospital operator. 3. Locate the [facility] provider you are looking for under [facility] and page to a number that you can be directly reached. 4. If you still have difficulty reaching the provider, please page the [facility] (Director on Call) for the Hospitalists listed on amion for assistance.

## 2020-12-16 NOTE — Progress Notes (Signed)
Assumed care of patient from off going RN, agree with previous assessment

## 2020-12-16 NOTE — Progress Notes (Signed)
I visited with Mr Bellucci along with [place].  He requested prayer which we provided.  He expressed gratitude for the time that we spent with him.    [place], [place] Pager, [phone] 10:30 AM

## 2020-12-16 NOTE — TOC Progression Note (Signed)
Transition of Care [facility]) - Progression Note    Patient Details  Name: Jesse [name] MRN: [id number] Date of Birth: June 12, 1928  Transition of Care [facility]) CM/SW Contact  Nitisha Civello, Juliann Pulse, RN Phone Number: 12/16/2020, 1:40 PM  Clinical Narrative: From [facility]-ALF-NGT in place. PT ordered await recc.      Expected Discharge Plan: Assisted Living Barriers to Discharge: Continued Medical Work up  Expected Discharge Plan and Services Expected Discharge Plan: Assisted Living   Discharge Planning Services: CM Consult   Living arrangements for the past 2 months: Assisted Living Facility                                       Social Determinants of Health (SDOH) Interventions    Readmission Risk Interventions No flowsheet data found.

## 2020-12-16 NOTE — Plan of Care (Signed)
Pt aox4, no complains of n/v thru the night, able to ambulate in the hall and pt pass gas this morning, will continue plan of care.  Problem: Health Behavior/Discharge Planning: Goal: Ability to manage health-related needs will improve Outcome: Progressing   Problem: Clinical Measurements: Goal: Diagnostic test results will improve Outcome: Progressing   Problem: Activity: Goal: Risk for activity intolerance will decrease Outcome: Progressing   Problem: Elimination: Goal: Will not experience complications related to bowel motility Outcome: Progressing   Problem: Elimination: Goal: Will not experience complications related to urinary retention Outcome: Progressing   Problem: Pain Managment: Goal: General experience of comfort will improve Outcome: Progressing   Problem: Safety: Goal: Ability to remain free from injury will improve Outcome: Progressing

## 2020-12-16 NOTE — Care Management Important Message (Signed)
Important Message  Patient Details IM Letter placed in the Patient's room. Name: Jesse [name] MRN: [id number] Date of Birth: 1928/07/22   Medicare Important Message Given:  Yes     Kerin Salen 12/16/2020, 11:05 AM

## 2020-12-16 NOTE — Progress Notes (Signed)
[facility] Progress Note     Subjective: CC-  NG placed by IR yesterday. 4.1L recorded out since placement. States that he is feeling much better today. Abdominal pain nearly resolved. No longer has n/v. Started passing a small amount of flatus this morning, no BM.  Objective: Vital signs in last 24 hours: Temp:  [97.8 F (36.6 C)-98.2 F (36.8 C)] 97.8 F (36.6 C) (05/25 0609) Pulse Rate:  [60-76] 60 (05/25 0609) Resp:  [18] 18 (05/25 0609) BP: (116-131)/(66-85) 127/66 (05/25 0609) SpO2:  [93 %-94 %] 94 % (05/25 0609) Last BM Date: 12/13/20  Intake/Output from previous day: 05/24 0701 - 05/25 0700 In: 912 [I.V.:787; IV Piggyback:125] Out: 1607 [Urine:575; Emesis/NG output:4100] Intake/Output this shift: No intake/output data recorded.  PE: Gen: Alert, NAD Pulm: rate and effort normal PXT:GGYI, mild distension, nontender, hypoactive BS  Lab Results:  Recent Labs    12/15/20 0440 12/16/20 0445  WBC 7.4 4.6  HGB 12.5* 11.2*  HCT 39.0 35.1*  PLT 233 218   BMET Recent Labs    12/15/20 0440 12/16/20 0445  NA 136 137  K 3.7 3.4*  CL 103 103  CO2 25 27  GLUCOSE 115* 92  BUN 36* 41*  CREATININE 0.83 0.69  CALCIUM 9.0 8.7*   PT/INR No results for input(s): LABPROT, INR in the last 72 hours. CMP     Component Value Date/Time   NA 137 12/16/2020 0445   NA 141 03/02/2017 0753   K 3.4 (L) 12/16/2020 0445   CL 103 12/16/2020 0445   CO2 27 12/16/2020 0445   GLUCOSE 92 12/16/2020 0445   GLUCOSE 100 (H) 05/05/2006 0839   BUN 41 (H) 12/16/2020 0445   BUN 26 03/02/2017 0753   CREATININE 0.69 12/16/2020 0445   CREATININE 0.99 03/10/2014 1445   CALCIUM 8.7 (L) 12/16/2020 0445   PROT 7.0 12/12/2020 0338   PROT 5.6 (L) 03/02/2017 0753   ALBUMIN 4.1 12/12/2020 0338   ALBUMIN 3.7 03/02/2017 0753   AST 15 12/12/2020 0338   ALT 14 12/12/2020 0338   ALKPHOS 38 12/12/2020 0338   BILITOT 1.1 12/12/2020 0338   BILITOT 0.6 03/02/2017 0753   GFRNONAA  >60 12/16/2020 0445   GFRAA >60 08/20/2019 0438   Lipase     Component Value Date/Time   LIPASE 29 12/12/2020 0338       Studies/Results: IR Naso G Tube Plc W/FL W/Rad  Result Date: 12/15/2020 CLINICAL DATA:  87 year old with small bowel obstruction and needs a nasogastric tube. Nasogastric tube could not be placed on the floor. EXAM: PLACEMENT OF NASOGASTRIC TUBE WITH FLUOROSCOPY ANESTHESIA/SEDATION: None MEDICATIONS: Viscous lidocaine CONTRAST:  None PROCEDURE: Viscous lidocaine was placed in the right nostril. 16 French nasogastric tube was advanced easily through the right nostril. The tube was preferentially going into the airway based on fluoroscopy. Using a lateral projection, the tube was successfully advanced into the proximal esophagus. Tube was advanced into the stomach. The tube was secured to the patient's nose. COMPLICATIONS: None immediate FINDINGS: Nasogastric tube was preferentially going into the airway. Nasogastric tube was successfully navigated into the esophagus using fluoroscopy. Nasogastric tube tip was placed in the stomach at the end of the procedure. Fluoroscopic images were taken and saved for this procedure. IMPRESSION: Successful placement of a nasogastric tube with fluoroscopy. Electronically Signed   By: Markus Daft M.D.   On: 12/15/2020 14:53    Anti-infectives: Anti-infectives (From admission, onward)   Start     Dose/Rate Route Frequency  Ordered Stop   12/12/20 0715  cefTRIAXone (ROCEPHIN) 1 g in sodium chloride 0.9 % 100 mL IVPB        1 g 200 mL/hr over 30 Minutes Intravenous  Once 12/12/20 0706 12/12/20 0841       Assessment/Plan CAD s/p CABG Bilateral internal iliac artery aneurysms  Prior AAA repair HTN HLD RBBB Prostate cancer s/p XRT  Recurrent SBO - NG placed 5/24 by IR, high volume output. He has started passing a little flatus this morning. Continue NGT to LIWS for decompression this morning, once output slows down will start small  bowel obstruction protocol.  Patient would bevery high riskwith anyoperative intervention, hopefully we can avoid this. Palliative team following.   ID -rocephin 5/21 x1 FEN -IVF, NPO/NGT to LIWS VTE -SCDs, sq heparin Foley -none   LOS: 4 days    Wellington [place], [name] 12/16/2020, 8:32 AM Please see Amion for pager number during day hours 7:00am-4:30pm

## 2020-12-17 DIAGNOSIS — Z515 Encounter for palliative care: Secondary | ICD-10-CM | POA: Diagnosis not present

## 2020-12-17 DIAGNOSIS — K56609 Unspecified intestinal obstruction, unspecified as to partial versus complete obstruction: Secondary | ICD-10-CM | POA: Diagnosis not present

## 2020-12-17 LAB — CBC
HCT: 35.1 % — ABNORMAL LOW (ref 39.0–52.0)
Hemoglobin: 11.2 g/dL — ABNORMAL LOW (ref 13.0–17.0)
MCH: 29.8 pg (ref 26.0–34.0)
MCHC: 31.9 g/dL (ref 30.0–36.0)
MCV: 93.4 fL (ref 80.0–100.0)
Platelets: 222 10*3/uL (ref 150–400)
RBC: 3.76 MIL/uL — ABNORMAL LOW (ref 4.22–5.81)
RDW: 13.4 % (ref 11.5–15.5)
WBC: 4.1 10*3/uL (ref 4.0–10.5)
nRBC: 0 % (ref 0.0–0.2)

## 2020-12-17 LAB — BASIC METABOLIC PANEL
Anion gap: 11 (ref 5–15)
BUN: 33 mg/dL — ABNORMAL HIGH (ref 8–23)
CO2: 22 mmol/L (ref 22–32)
Calcium: 8.7 mg/dL — ABNORMAL LOW (ref 8.9–10.3)
Chloride: 104 mmol/L (ref 98–111)
Creatinine, Ser: 0.6 mg/dL — ABNORMAL LOW (ref 0.61–1.24)
GFR, Estimated: >60 mL/min (ref >60)
Glucose, Bld: 78 mg/dL (ref 70–99)
Potassium: 3.8 mmol/L (ref 3.5–5.1)
Sodium: 137 mmol/L (ref 135–145)

## 2020-12-17 LAB — MAGNESIUM: Magnesium: 2.2 mg/dL (ref 1.7–2.4)

## 2020-12-17 MED ORDER — POTASSIUM CHLORIDE 20 MEQ PO PACK
40.0000 meq | PACK | Freq: Once | ORAL | Status: AC
  Administered 2020-12-17: 40 meq via ORAL
  Filled 2020-12-17: qty 2

## 2020-12-17 NOTE — Progress Notes (Signed)
PROGRESS NOTE    Jesse [name]  SEG:[id number] DOB: [birth year] DOA: 12/12/2020 PCP: [name], MD   Brief Narrative:   87 year old male with past medical history of hypertension, hyperlipidemia,RBBB,CAD s/p CABG, AAA s/p repair and s/p bilateral femoral aneurysm repair in '95, thoracic aneurysm, GI bleed,prostate cancer s/p XRT, bladder tumor s/p resection,distant repair of bowel obstruction, hernia repair with mesh who presented to the ED from home with lower abdominal pain and nausea and was Found to have early or partial SBO with transition point along the ventral abdominal wall likely due to adhesions in the setting of prior abdominal surgery with no free air or free fluid, chronic massive bilateral internal iliac artery aneurysms but enlarged since 2019 with subsequent increased mass-effect on the sigmoid colon as it traverses the mid pelvis but no large bowel obstruction, otherwise chronic issues based on the CT abdomen and pelvis.  Patient admitted to hospitalist service with general surgery consulted.  Multiple attempts failed for NG tube placement and subsequently it was placed by IR on 12/15/2020.   Assessment & Plan:   Principal Problem:   SBO (small bowel obstruction) (HCC) Active Problems:   HLD (hyperlipidemia)   Essential hypertension   CAD, ARTERY BYPASS GRAFT   Iliac artery aneurysm, bilateral (HCC)   Aneurysm of abdominal vessel (HCC)    Small bowel obstruction secondary to adhesions: Initially he had 4.1 L recorded output from NG tube.  Reportedly had only 50 cc out overnight but this morning, his NG tube was not connected to the suction.  After it was connected to suction, he had 75 cc output more.  He feels bloated today.  Passing flatus but no bowel movement.  Has sluggish bowel sounds but abdominal slightly distended than yesterday.  Continue current management with NG tube with suction.  Per surgery note, plan for small bowel obstruction protocol after  suctioning.  Abnormal UA /pyuria -patient has no urinary symptoms.  Has history of bladder tumor resection which could explain the pyuria due to inflammation.  Defer antibiotics and monitor clinically.  Chronic massive bilateral internal iliac artery aneurysms with extensive vascular history -admitting hospitalist discussed with vascular surgeon Dr. Scot Dock.  No acute issues and no intervention or evaluation warranted this admission. -- Close outpatient follow-up  Hypokalemia: Resolved.  CAD status post CABG /hyperlipidemia -stable --Holding aspirin, Coreg, statin, fenofibrate due to n.p.o. status  Patient BMI: Body mass index is 23.73 kg/m.   DVT prophylaxis: heparin injection 5,000 Units Start: 12/12/20 0815   Code Status: DNR  Family Communication:  None present at bedside.  Plan of care discussed with patient in length and he verbalized understanding and agreed with it.  Status is: Inpatient  Remains inpatient appropriate because:Inpatient level of care appropriate due to severity of illness   Dispo: The patient is from: Home              Anticipated d/c is to: Home              Patient currently is not medically stable to d/c.   Difficult to place patient No        Estimated body mass index is 23.73 kg/m as calculated from the following:   Height as of this encounter: 6' (1.829 m).   Weight as of this encounter: 79.4 kg.      Nutritional status:               Consultants:   General surgery  Palliative care  Procedures:  Plan  Antimicrobials:  Anti-infectives (From admission, onward)   Start     Dose/Rate Route Frequency Ordered Stop   12/12/20 0715  cefTRIAXone (ROCEPHIN) 1 g in sodium chloride 0.9 % 100 mL IVPB        1 g 200 mL/hr over 30 Minutes Intravenous  Once 12/12/20 0706 12/12/20 0841         Subjective: Seen and examined.  Feels more bloated but passing flatus.  No nausea or vomiting.  No other  complaint.  Objective: Vitals:   12/16/20 0609 12/16/20 1239 12/16/20 2210 12/17/20 0558  BP: 127/66 127/80 (!) 154/78 (!) 159/80  Pulse: 60 72 82 62  Resp: 18 18  18   Temp: 97.8 F (36.6 C) (!) 97.5 F (36.4 C) 98.7 F (37.1 C) 97.6 F (36.4 C)  TempSrc: Oral Oral Oral Oral  SpO2: 94% 95% 98% 95%  Weight:      Height:        Intake/Output Summary (Last 24 hours) at 12/17/2020 1348 Last data filed at 12/17/2020 0700 Gross per 24 hour  Intake 1048 ml  Output 925 ml  Net 123 ml   Filed Weights   12/12/20 0323  Weight: 79.4 kg    Examination:  General exam: Appears calm and comfortable  Respiratory system: Clear to auscultation. Respiratory effort normal. Cardiovascular system: S1 & S2 heard, RRR. No JVD, murmurs, rubs, gallops or clicks. No pedal edema. Gastrointestinal system: Abdomen is moderately distended but soft and slightly tender in right lower quadrant. No organomegaly or masses felt. Normal bowel sounds heard. Central nervous system: Alert and oriented. No focal neurological deficits. Extremities: Symmetric 5 x 5 power. Skin: No rashes, lesions or ulcers.  Psychiatry: Judgement and insight appear normal. Mood & affect appropriate.    Data Reviewed: I have personally reviewed following labs and imaging studies  CBC: Recent Labs  Lab 12/13/20 0505 12/14/20 0451 12/15/20 0440 12/16/20 0445 12/17/20 0444  WBC 9.7 8.7 7.4 4.6 4.1  HGB 13.0 12.8* 12.5* 11.2* 11.2*  HCT 40.7 40.2 39.0 35.1* 35.1*  MCV 92.5 93.5 93.5 93.9 93.4  PLT 241 238 233 218 854   Basic Metabolic Panel: Recent Labs  Lab 12/13/20 0505 12/14/20 0451 12/15/20 0440 12/16/20 0445 12/17/20 0444  NA 138 137 136 137 137  K 4.0 3.8 3.7 3.4* 3.8  CL 104 102 103 103 104  CO2 27 29 25 27 22   GLUCOSE 131* 120* 115* 92 78  BUN 27* 35* 36* 41* 33*  CREATININE 1.14 0.92 0.83 0.69 0.60*  CALCIUM 9.2 9.2 9.0 8.7* 8.7*  MG  --   --   --   --  2.2   GFR: Estimated Creatinine Clearance:  67.4 mL/min (A) (by C-G formula based on SCr of 0.6 mg/dL (L)). Liver Function Tests: Recent Labs  Lab 12/12/20 0338  AST 15  ALT 14  ALKPHOS 38  BILITOT 1.1  PROT 7.0  ALBUMIN 4.1   Recent Labs  Lab 12/12/20 0338  LIPASE 29   No results for input(s): AMMONIA in the last 168 hours. Coagulation Profile: No results for input(s): INR, PROTIME in the last 168 hours. Cardiac Enzymes: No results for input(s): CKTOTAL, CKMB, CKMBINDEX, TROPONINI in the last 168 hours. BNP (last 3 results) No results for input(s): PROBNP in the last 8760 hours. HbA1C: No results for input(s): HGBA1C in the last 72 hours. CBG: No results for input(s): GLUCAP in the last 168 hours. Lipid Profile: No results for input(s): CHOL, HDL, LDLCALC,  TRIG, CHOLHDL, LDLDIRECT in the last 72 hours. Thyroid Function Tests: No results for input(s): TSH, T4TOTAL, FREET4, T3FREE, THYROIDAB in the last 72 hours. Anemia Panel: No results for input(s): VITAMINB12, FOLATE, FERRITIN, TIBC, IRON, RETICCTPCT in the last 72 hours. Sepsis Labs: No results for input(s): PROCALCITON, LATICACIDVEN in the last 168 hours.  Recent Results (from the past 240 hour(s))  Urine culture     Status: None   Collection Time: 12/12/20  3:38 AM   Specimen: Urine, Random  Result Value Ref Range Status   Specimen Description   Final    URINE, RANDOM Performed at [facility] [address], [place], [place] [address]    Special Requests   Final    NONE Performed at [facility], [place] [address] [place], [place] [address]    Culture   Final    NO GROWTH Performed at [facility], [place] [address], [place], [place] [address]    Report Status 12/14/2020 FINAL  Final  SARS CORONAVIRUS 2 (TAT 6-24 HRS) Nasopharyngeal Nasopharyngeal Swab     Status: None   Collection Time: 12/12/20  7:36 AM   Specimen: Nasopharyngeal Swab  Result Value Ref Range Status   SARS Coronavirus 2 NEGATIVE NEGATIVE  Final    Comment: (NOTE) SARS-CoV-2 target nucleic acids are NOT DETECTED.  The SARS-CoV-2 RNA is generally detectable in upper and lower respiratory specimens during the acute phase of infection. Negative results do not preclude SARS-CoV-2 infection, do not rule out co-infections with other pathogens, and should not be used as the sole basis for treatment or other patient management decisions. Negative results must be combined with clinical observations, patient history, and epidemiological information. The expected result is Negative.  Fact Sheet for Patients: SugarRoll.be  Fact Sheet for Healthcare Providers: https://www.woods-mathews.com/  This test is not yet approved or cleared by the [place] FDA and  has been authorized for detection and/or diagnosis of SARS-CoV-2 by FDA under an Emergency Use Authorization (EUA). This EUA will remain  in effect (meaning this test can be used) for the duration of the COVID-19 declaration under Se ction 564(b)(1) of the Act, 21 U.S.C. section 360bbb-3(b)(1), unless the authorization is terminated or revoked sooner.  Performed at [facility], [place] [address], [place], [place] [address]       Radiology Studies: No results found.  Scheduled Meds: . chlorhexidine  15 mL Mouth Rinse BID  . diatrizoate meglumine-sodium  90 mL Per NG tube Once  . glycopyrrolate  0.4 mg Intravenous TID  . heparin  5,000 Units Subcutaneous Q8H  . hydrALAZINE  5 mg Intravenous Once  . lidocaine  1 patch Transdermal Q24H  . lip balm  1 application Topical BID  . sodium chloride flush  3 mL Intravenous Q12H   Continuous Infusions: . chlorproMAZINE (THORAZINE) IV 12.5 mg (12/17/20 0936)  . methocarbamol (ROBAXIN) IV 1,000 mg (12/15/20 1012)  . promethazine (PHENERGAN) injection (IM or IVPB)       LOS: 5 days   Time spent: 28 minutes   Darliss Cheney, MD [facility]  12/17/2020, 1:48 PM    How to contact the [facility] Attending or Consulting provider [place] or covering provider during after hours [place], for this patient?  1. Check the care team in [facility] and look for a) attending/consulting TR[facility] provider listed and b) the [facility] team listed. Page or secure chat [phone]. 2. Log into [email or web address] and use [place]'s universal password  to access. If you do not have the password, please contact the hospital operator. 3. Locate the [facility] provider you are looking for under [facility] and page to a number that you can be directly reached. 4. If you still have difficulty reaching the provider, please page the [facility] (Director on Call) for the Hospitalists listed on amion for assistance.

## 2020-12-17 NOTE — Progress Notes (Signed)
Jesse [name] visited pt. per referral passed along from [facility].  Pt. sitting up in bed w/son and dtr. in law sitting at window.  Pt. requested prayer for concerning world events.  Pt. says PT is expected to work w/him today and that he is eager to get back on his feet: "I have to keep moving!" No further needs at this time; chaplains remain available as needed.  Lindaann Pascal PRN Chaplain Pager: [phone]

## 2020-12-17 NOTE — Progress Notes (Signed)
Daily Progress Note   Patient Name: Jesse [name]       Date: 12/17/2020 DOB: 1927/08/18  Age: 87 y.o. MRN#: [id number] Attending Physician: Darliss Cheney, MD Primary Care Physician: [name], MD Admit Date: 12/12/2020  Reason for Consultation/Follow-up: Establishing goals of care and Psychosocial/spiritual support  Subjective: Medical records reviewed. Discussed with Archivist and assessed patient at the bedside. He is alert and pleasant while RN assists with NG tube care. Per RN, patient had difficulty with suction overnight and is now reconnected to suction. Patient denies feeling hungry and is glad to hear his son was calling to check on him today. He is in good spirits and thankful for the excellent nursing care he is receiving.   Returned son Jesse [name]'s call to provide updates and support. Jesse [name] is concerned that patient has not yet resumed an oral diet, as he has not eaten since Friday of last week. He requests to speak with medical team if possible when he visits later this afternoon, around 2pm. Educated on the importance of adequate NG decompression before proceeding with small bowel obstruction protocol, per surgery recommendations. Jesse [name] is concerned for patient's long-term nutrition, but understands the current plan. Discussed the difficulty patients may have tolerating NG suction at times. Jesse [name] states he feels much better after our conversation and he remains hopeful that suction will continue to provide relief and allow for reinitiation of oral intake. Emotional support provided.  Questions and concerns addressed. PMT will continue to support holistically.  Length of Stay: 5  Current Medications: Scheduled Meds:  . chlorhexidine  15 mL Mouth Rinse BID  . diatrizoate  meglumine-sodium  90 mL Per NG tube Once  . glycopyrrolate  0.4 mg Intravenous TID  . heparin  5,000 Units Subcutaneous Q8H  . hydrALAZINE  5 mg Intravenous Once  . lidocaine  1 patch Transdermal Q24H  . lip balm  1 application Topical BID  . sodium chloride flush  3 mL Intravenous Q12H    Continuous Infusions: . chlorproMAZINE (THORAZINE) IV 12.5 mg (12/17/20 0936)  . methocarbamol (ROBAXIN) IV 1,000 mg (12/15/20 1012)  . promethazine (PHENERGAN) injection (IM or IVPB)      PRN Meds: acetaminophen, alum & mag hydroxide-simeth, chlorproMAZINE (THORAZINE) IV, diazepam, HYDROmorphone (DILAUDID) injection, magic mouthwash, menthol-cetylpyridinium, methocarbamol (ROBAXIN) IV, metoprolol tartrate, [DISCONTINUED] ondansetron **OR**  ondansetron (ZOFRAN) IV, phenol, promethazine (PHENERGAN) injection (IM or IVPB)  Physical Exam Vitals and nursing note reviewed.  Constitutional:      Comments: NG tube in place.  Cardiovascular:     Rate and Rhythm: Normal rate.  Pulmonary:     Effort: Pulmonary effort is normal.  Abdominal:     General: There is distension.  Neurological:     Mental Status: He is alert and oriented to person, place, and time.  Psychiatric:        Mood and Affect: Mood normal.        Behavior: Behavior is cooperative.             Vital Signs: BP (!) 159/80 (BP Location: Right Arm)   Pulse 62   Temp 97.6 F (36.4 C) (Oral)   Resp 18   Ht 6' (1.829 m)   Wt 79.4 kg   SpO2 95%   BMI 23.73 kg/m  SpO2: SpO2: 95 % O2 Device: O2 Device: Room Air O2 Flow Rate:    Intake/output summary:   Intake/Output Summary (Last 24 hours) at 12/17/2020 1042 Last data filed at 12/17/2020 0700 Gross per 24 hour  Intake 1048 ml  Output 1125 ml  Net -77 ml   LBM: Last BM Date: 12/13/20 Baseline Weight: Weight: 79.4 kg Most recent weight: Weight: 79.4 kg       Palliative Assessment/Data:     Patient Active Problem List   Diagnosis Date Noted  . SBO (small bowel  obstruction) ([place]) 12/12/2020  . Abducens (sixth) nerve palsy, left 02/20/2020  . Binocular vision disorder with diplopia 02/20/2020  . Gross hematuria 08/15/2019  . History of prostate cancer 08/15/2019  . Anxiety 06/21/2019  . Anemia due to chronic illness 06/20/2019  . Pneumonia due to COVID-19 virus 06/20/2019  . Displaced spiral fracture of shaft of right femur, initial encounter for open fracture type I or II ([place]) 06/20/2019  . COVID-19 virus infection 06/19/2019  . Surgery, elective   . Hip fracture ([place]) 04/15/2019  . Fall   . Coronary artery disease involving native coronary artery of native heart without angina pectoris 11/20/2018  . Thoracic aortic aneurysm without rupture ([place]) 11/20/2018  . Chronic systolic heart failure ([place]) 11/20/2018  . Duodenal ulcer with hemorrhage 05/05/2018  . Hemorrhagic shock ([place]) 05/05/2018  . AKI (acute kidney injury) ([place]) 05/05/2018  . Orthostatic hypotension 05/04/2018  . Symptomatic anemia 05/04/2018  . Melena 05/04/2018  . GI bleed 05/04/2018  . Patella fracture 06/09/2015  . Aftercare following surgery of the circulatory system, [place] 01/08/2013  . Pain in limb- Left popliteal 12/25/2012  . Aneurysm artery, femoral ([place]) 05/22/2012  . 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 Aneurysm of abdominal vessel ([place]) 02/21/2012  . Iliac artery aneurysm, bilateral ([place]) 08/09/2011  . Aneurysm of artery of lower extremity ([place]) 08/09/2011  . HLD (hyperlipidemia) 01/16/2009  . Essential hypertension 01/16/2009  . CAD, ARTERY BYPASS GRAFT 01/16/2009  . PERIPHERAL VASCULAR DISEASE 01/16/2009  . ABDOMINAL AORTIC ANEURYSM REPAIR, HX OF 01/16/2009  . MIXED HYPERLIPIDEMIA 12/28/2008    Palliative Care Assessment & Plan   Patient Profile: 87 year old male with past medical history of hypertension, hyperlipidemia,RBBB,CAD s/p CABG, AAA s/p repair and s/p bilateral femoral aneurysm repair in '95, thoracic aneurysm, GI bleed,prostate cancer  s/p XRT, bladder tumor s/p resection,distant repair of bowel obstruction, hernia repair with mesh who presented to the ED from home with lower abdominal pain and nausea and was found to have early or partial  SBO with transition point along the ventral abdominal wall likely due to adhesions in the setting of prior abdominal surgery with no free air or free fluid, chronic massive bilateral internal iliac artery aneurysms but enlarged since 2019 with subsequent increased mass-effect on the sigmoid colon as it traverses the mid pelvis but no large bowel obstruction, otherwise chronic issues based on the CT abdomen and pelvis.  Patient admitted to hospitalist service with general surgery consulted.  Multiple attempts failed for NG tube placement and subsequently it was placed by IR on 12/15/2020.  Assessment: SBO, NG tube  Recommendations/Plan:  Education, updates, psychosocial and emotional support provided for patient's son Jesse [name]  Continue current interventions  Ongoing support from PMT   Goals of Care and Additional Recommendations:  Limitations on Scope of Treatment: see MOST form  Code Status: DNR/DNI   Code Status Orders  (From admission, onward)         Start     Ordered   12/13/20 1307  Do not attempt resuscitation (DNR)  Continuous       Question Answer Comment  In the event of cardiac or respiratory ARREST Do not call a "code blue"   In the event of cardiac or respiratory ARREST Do not perform Intubation, CPR, defibrillation or ACLS   In the event of cardiac or respiratory ARREST Use medication by any route, position, wound care, and other measures to relive pain and suffering. May use oxygen, suction and manual treatment of airway obstruction as needed for comfort.      12/13/20 1307        Code Status History    Date Active Date Inactive Code Status Order ID Comments User Context   12/12/2020 0804 12/13/2020 1307 Full Code [id number]  [name], MD ED   08/15/2019  1553 08/21/2019 1908 DNR [id number]  Karmen Bongo, MD ED   06/15/2019 2308 06/24/2019 1607 DNR [id number]  Shela Leff, MD ED   06/15/2019 2308 06/15/2019 2308 Full Code [id number]  Shela Leff, MD ED   04/15/2019 1809 04/20/2019 1700 DNR [id number]  Georgette Shell, MD ED   04/15/2019 1741 04/15/2019 1809 Full Code [id number]  Georgette Shell, MD ED   05/04/2018 1443 05/08/2018 1621 DNR [id number]  Elodia Florence., MD Inpatient   06/09/2015 1037 06/11/2015 1711 Full Code [id number]  Lanae Crumbly, PA-C Inpatient   05/07/2012 1400 05/09/2012 1556 Full Code [id number]  Jolyne Loa, RN Inpatient   03/19/2012 8250 03/21/2012 1901 Full Code [id number]  Johnsie Cancel, RN Inpatient   Advance Care Planning Activity      Prognosis:   Guarded prognosis, will likely need discussion of comfort care if NG decompression fails  Discharge Planning:  To Be Determined  Total time: 20 minutes  Greater than 50% of this time was spent in counseling and coordinating care related to the above assessment and plan.  Dorthy Cooler, PA-C Palliative Medicine Team Team phone # [phone]  Thank you for allowing the Palliative Medicine Team to assist in the care of this patient. Please utilize secure chat with additional questions, if there is no response within 30 minutes please call the above phone number.  Palliative Medicine Team providers are available by phone from 7am to 7pm daily and can be reached through the team cell phone.  Should this patient require assistance outside of these hours, please call the patient's attending physician.

## 2020-12-17 NOTE — Progress Notes (Signed)
[facility] Progress Note     Subjective: CC-  Feels a little more bloated this morning. He reports worsening hiccups, no n/v. Thinks he passed a little flatus this morning, no BM.  NG tube with only 50cc output recorded last 24 hours. Suction not connected to canister when I saw him this morning.   Objective: Vital signs in last 24 hours: Temp:  [97.5 F (36.4 C)-98.7 F (37.1 C)] 97.6 F (36.4 C) (05/26 0558) Pulse Rate:  [62-82] 62 (05/26 0558) Resp:  [18] 18 (05/26 0558) BP: (127-159)/(78-80) 159/80 (05/26 0558) SpO2:  [95 %-98 %] 95 % (05/26 0558) Last BM Date: 12/13/20  Intake/Output from previous day: 05/25 0701 - 05/26 0700 In: 928 [P.O.:600; I.V.:3; IV Piggyback:325] Out: 3235 [TDDUK:0254; Emesis/NG output:50] Intake/Output this shift: No intake/output data recorded.  PE:  Gen: Alert, NAD Pulm: rate and effort normal YHC:WCBJSEGBT but soft, mild RLQ TTP without rebound or guarding, few BS heard  Lab Results:  Recent Labs    12/16/20 0445 12/17/20 0444  WBC 4.6 4.1  HGB 11.2* 11.2*  HCT 35.1* 35.1*  PLT 218 222   BMET Recent Labs    12/16/20 0445 12/17/20 0444  NA 137 137  K 3.4* 3.8  CL 103 104  CO2 27 22  GLUCOSE 92 78  BUN 41* 33*  CREATININE 0.69 0.60*  CALCIUM 8.7* 8.7*   PT/INR No results for input(s): LABPROT, INR in the last 72 hours. CMP     Component Value Date/Time   NA 137 12/17/2020 0444   NA 141 03/02/2017 0753   K 3.8 12/17/2020 0444   CL 104 12/17/2020 0444   CO2 22 12/17/2020 0444   GLUCOSE 78 12/17/2020 0444   GLUCOSE 100 (H) 05/05/2006 0839   BUN 33 (H) 12/17/2020 0444   BUN 26 03/02/2017 0753   CREATININE 0.60 (L) 12/17/2020 0444   CREATININE 0.99 03/10/2014 1445   CALCIUM 8.7 (L) 12/17/2020 0444   PROT 7.0 12/12/2020 0338   PROT 5.6 (L) 03/02/2017 0753   ALBUMIN 4.1 12/12/2020 0338   ALBUMIN 3.7 03/02/2017 0753   AST 15 12/12/2020 0338   ALT 14 12/12/2020 0338   ALKPHOS 38 12/12/2020 0338    BILITOT 1.1 12/12/2020 0338   BILITOT 0.6 03/02/2017 0753   GFRNONAA >60 12/17/2020 0444   GFRAA >60 08/20/2019 0438   Lipase     Component Value Date/Time   LIPASE 29 12/12/2020 0338       Studies/Results: IR Naso G Tube Plc W/FL W/Rad  Result Date: 12/15/2020 CLINICAL DATA:  87 year old with small bowel obstruction and needs a nasogastric tube. Nasogastric tube could not be placed on the floor. EXAM: PLACEMENT OF NASOGASTRIC TUBE WITH FLUOROSCOPY ANESTHESIA/SEDATION: None MEDICATIONS: Viscous lidocaine CONTRAST:  None PROCEDURE: Viscous lidocaine was placed in the right nostril. 16 French nasogastric tube was advanced easily through the right nostril. The tube was preferentially going into the airway based on fluoroscopy. Using a lateral projection, the tube was successfully advanced into the proximal esophagus. Tube was advanced into the stomach. The tube was secured to the patient's nose. COMPLICATIONS: None immediate FINDINGS: Nasogastric tube was preferentially going into the airway. Nasogastric tube was successfully navigated into the esophagus using fluoroscopy. Nasogastric tube tip was placed in the stomach at the end of the procedure. Fluoroscopic images were taken and saved for this procedure. IMPRESSION: Successful placement of a nasogastric tube with fluoroscopy. Electronically Signed   By: Markus Daft M.D.   On: 12/15/2020 14:53  Anti-infectives: Anti-infectives (From admission, onward)   Start     Dose/Rate Route Frequency Ordered Stop   12/12/20 0715  cefTRIAXone (ROCEPHIN) 1 g in sodium chloride 0.9 % 100 mL IVPB        1 g 200 mL/hr over 30 Minutes Intravenous  Once 12/12/20 0706 12/12/20 0841       Assessment/Plan CAD s/p CABG Bilateral internal iliac artery aneurysms  Prior AAA repair HTN HLD RBBB Prostate cancer s/p XRT  Recurrent SBO - difficult NG tube placement - NG placed 5/24 by IR - NG suction tubing off when I saw him this morning. NG flushed and  hooked back up and NG began pouring out fluid again. Continue NG tube to LIWS for decompression today. Plan to start small bowel obstruction protocol once decompressed.   Patient would bevery high riskwith anyoperative intervention, hopefully we can avoid this. Palliative team following.   ID -rocephin 5/21 x1 FEN -IVF, NPO/NGT to LIWS VTE -SCDs, sq heparin Foley -none   LOS: 5 days    Wellington Hampshire, [facility] Surgery 12/17/2020, 8:23 AM Please see Amion for pager number during day hours 7:00am-4:30pm

## 2020-12-18 ENCOUNTER — Inpatient Hospital Stay (HOSPITAL_COMMUNITY): Payer: Medicare Other

## 2020-12-18 DIAGNOSIS — K56609 Unspecified intestinal obstruction, unspecified as to partial versus complete obstruction: Secondary | ICD-10-CM | POA: Diagnosis not present

## 2020-12-18 LAB — BASIC METABOLIC PANEL
Anion gap: 11 (ref 5–15)
BUN: 30 mg/dL — ABNORMAL HIGH (ref 8–23)
CO2: 23 mmol/L (ref 22–32)
Calcium: 9.2 mg/dL (ref 8.9–10.3)
Chloride: 103 mmol/L (ref 98–111)
Creatinine, Ser: 0.73 mg/dL (ref 0.61–1.24)
GFR, Estimated: >60 mL/min (ref >60)
Glucose, Bld: 65 mg/dL — ABNORMAL LOW (ref 70–99)
Potassium: 3.9 mmol/L (ref 3.5–5.1)
Sodium: 137 mmol/L (ref 135–145)

## 2020-12-18 MED ORDER — DIATRIZOATE MEGLUMINE & SODIUM 66-10 % PO SOLN
90.0000 mL | Freq: Once | ORAL | Status: AC
  Administered 2020-12-18: 90 mL via NASOGASTRIC
  Filled 2020-12-18: qty 90

## 2020-12-18 NOTE — Progress Notes (Signed)
Gastrografin administered at 1300 and NGT clamped. NGT resumed LIS

## 2020-12-18 NOTE — Progress Notes (Signed)
Physical Therapy Treatment Patient Details Name: Jesse [name] MRN: [id number] DOB: Dec 27, 1927 Today's Date: 12/18/2020    History of Present Illness Patient admitted with SBO. Multiple attempts failed for NG tube placement and subsequently it was placed by IR on 12/15/2020.  This is a 87 year old male with past medical history of hypertension, hyperlipidemia, RBBB, CAD s/p CABG, AAA s/p repair and s/p bilateral femoral aneurysm repair in '95, thoracic aneurysm, GI bleed, prostate cancer s/p XRT, bladder tumor s/p resection, distant repair of bowel obstruction, hernia repair with mesh who presented to the ED from home with lower abdominal pain, nausea and dry heaves    PT Comments    Pt assisted with ambulating however only tolerated short distance due to "sciatica" pain.  Pt agreeable to remain OOB and in recliner.    Follow Up Recommendations  Home health PT     Equipment Recommendations  None recommended by PT    Recommendations for Other Services       Precautions / Restrictions Precautions Precautions: Fall Precaution Comments: reports history of falls, NG tube to suction    Mobility  Bed Mobility Overal bed mobility: Needs Assistance Bed Mobility: Supine to Sit     Supine to sit: Min assist     General bed mobility comments: assist for trunk    Transfers Overall transfer level: Needs assistance Equipment used: Rolling walker (2 wheeled) Transfers: Sit to/from Stand Sit to Stand: Min guard         General transfer comment: min/guard due to pain  Ambulation/Gait Ambulation/Gait assistance: Min guard Gait Distance (Feet): 30 Feet Assistive device: Rolling walker (2 wheeled) Gait Pattern/deviations: Step-through pattern;Decreased stride length     General Gait Details: verbal cues for weight bearing through UEs to assist with taking weight off Rt LE, pt reports being limited by pain   Stairs             Wheelchair Mobility    Modified Rankin  (Stroke Patients Only)       Balance                                            Cognition Arousal/Alertness: Awake/alert Behavior During Therapy: WFL for tasks assessed/performed Overall Cognitive Status: Within Functional Limits for tasks assessed                                        Exercises      General Comments        Pertinent Vitals/Pain Pain Assessment: Faces Faces Pain Scale: Hurts whole lot Pain Location: right buttock ("sciatica") Pain Descriptors / Indicators: Sharp Pain Intervention(s): Monitored during session;Repositioned;Patient requesting pain meds-RN notified    Home Living                      Prior Function            PT Goals (current goals can now be found in the care plan section) Progress towards PT goals: Progressing toward goals    Frequency    Min 3X/week      PT Plan Current plan remains appropriate    Co-evaluation              AM-PAC PT "6 Clicks" Mobility   Outcome Measure  Help  needed turning from your back to your side while in a flat bed without using bedrails?: A Little Help needed moving from lying on your back to sitting on the side of a flat bed without using bedrails?: A Little Help needed moving to and from a bed to a chair (including a wheelchair)?: A Little Help needed standing up from a chair using your arms (e.g., wheelchair or bedside chair)?: A Little Help needed to walk in hospital room?: A Little Help needed climbing 3-5 steps with a railing? : A Lot 6 Click Score: 17    End of Session Equipment Utilized During Treatment: Gait belt Activity Tolerance: Patient limited by pain Patient left: with call bell/phone within reach;in chair;with chair alarm set Nurse Communication: Mobility status PT Visit Diagnosis: Other abnormalities of gait and mobility (R26.89)     Time: 4081-4481 PT Time Calculation (min) (ACUTE ONLY): 17 min  Charges:  $Gait  Training: 8-22 mins                    Arlyce Dice, DPT Acute Rehabilitation Services Pager: [phone] Office: [phone]   [name],KATHrine E 12/18/2020, 1:20 PM

## 2020-12-18 NOTE — Progress Notes (Signed)
PROGRESS NOTE    Cyree LENNIX KNEISEL  KDT:[id number] DOB: [birth year] DOA: 12/12/2020 PCP: [name], MD   Brief Narrative:   87 year old male with past medical history of hypertension, hyperlipidemia,RBBB,CAD s/p CABG, AAA s/p repair and s/p bilateral femoral aneurysm repair in '95, thoracic aneurysm, GI bleed,prostate cancer s/p XRT, bladder tumor s/p resection,distant repair of bowel obstruction, hernia repair with mesh who presented to the ED from home with lower abdominal pain and nausea and was Found to have early or partial SBO with transition point along the ventral abdominal wall likely due to adhesions in the setting of prior abdominal surgery with no free air or free fluid, chronic massive bilateral internal iliac artery aneurysms but enlarged since 2019 with subsequent increased mass-effect on the sigmoid colon as it traverses the mid pelvis but no large bowel obstruction, otherwise chronic issues based on the CT abdomen and pelvis.  Patient admitted to hospitalist service with general surgery consulted.  Multiple attempts failed for NG tube placement and subsequently it was placed by IR on 12/15/2020.   Assessment & Plan:   Principal Problem:   SBO (small bowel obstruction) (HCC) Active Problems:   HLD (hyperlipidemia)   Essential hypertension   CAD, ARTERY BYPASS GRAFT   Iliac artery aneurysm, bilateral (HCC)   Aneurysm of abdominal vessel (HCC)   Palliative care by specialist    Small bowel obstruction secondary to adhesions: Initially he had 4.1 L recorded output from NG tube.  He has had another 650 cc out in last 24 hours.  Feels better.  Still not passing flatus, no BM.  No nausea though.  General surgery managing.  Continues to be with NG tube and they are planning on small bowel obstruction protocol today.  Seen by palliative care, appreciate their assistance.  It appears that patient does not want to have any sort of surgery if it comes to that.  Abnormal UA /pyuria  -patient has no urinary symptoms.  Has history of bladder tumor resection which could explain the pyuria due to inflammation.  Defer antibiotics and monitor clinically.  Chronic massive bilateral internal iliac artery aneurysms with extensive vascular history -admitting hospitalist discussed with vascular surgeon Dr. Scot Dock.  No acute issues and no intervention or evaluation warranted this admission. -- Close outpatient follow-up  Hypokalemia: Resolved.  CAD status post CABG /hyperlipidemia/hypertension-stable --Holding aspirin, Coreg, statin, fenofibrate due to n.p.o. status  Patient BMI: Body mass index is 23.73 kg/m.   DVT prophylaxis: heparin injection 5,000 Units Start: 12/12/20 0815   Code Status: DNR  Family Communication:  None present at bedside.  Plan of care discussed with patient in length and he verbalized understanding and agreed with it.  I also discussed plan of care with patient's son [name] over the phone.  Status is: Inpatient  Remains inpatient appropriate because:Inpatient level of care appropriate due to severity of illness   Dispo: The patient is from: Home              Anticipated d/c is to: Home              Patient currently is not medically stable to d/c.   Difficult to place patient No        Estimated body mass index is 23.73 kg/m as calculated from the following:   Height as of this encounter: 6' (1.829 m).   Weight as of this encounter: 79.4 kg.      Nutritional status:  Consultants:   General surgery  Palliative care  Procedures:   None  Antimicrobials:  Anti-infectives (From admission, onward)   Start     Dose/Rate Route Frequency Ordered Stop   12/12/20 0715  cefTRIAXone (ROCEPHIN) 1 g in sodium chloride 0.9 % 100 mL IVPB        1 g 200 mL/hr over 30 Minutes Intravenous  Once 12/12/20 0706 12/12/20 0841         Subjective: Seen and examined.  Feels slightly better, no more abdominal pain but  still not passing flatus or any BM.  No nausea.  Objective: Vitals:   12/17/20 0558 12/17/20 1350 12/18/20 0032 12/18/20 0438  BP: (!) 159/80 (!) 149/88 (!) 136/92 127/75  Pulse: 62 68 68 64  Resp: 18  18 18   Temp: 97.6 F (36.4 C) 97.8 F (36.6 C) 98 F (36.7 C) 97.8 F (36.6 C)  TempSrc: Oral Oral Oral   SpO2: 95% 97% 96% 96%  Weight:      Height:        Intake/Output Summary (Last 24 hours) at 12/18/2020 1149 Last data filed at 12/18/2020 0400 Gross per 24 hour  Intake 208 ml  Output 1825 ml  Net -1617 ml   Filed Weights   12/12/20 0323  Weight: 79.4 kg    Examination: General exam: Appears calm and comfortable  Respiratory system: Clear to auscultation. Respiratory effort normal. Cardiovascular system: S1 & S2 heard, RRR. No JVD, murmurs, rubs, gallops or clicks. No pedal edema. Gastrointestinal system: Abdomen is slightly distended but soft and nontender. No organomegaly or masses felt. Normal bowel sounds heard. Central nervous system: Alert and oriented. No focal neurological deficits. Extremities: Symmetric 5 x 5 power. Skin: No rashes, lesions or ulcers.  Psychiatry: Judgement and insight appear normal. Mood & affect appropriate.    Data Reviewed: I have personally reviewed following labs and imaging studies  CBC: Recent Labs  Lab 12/13/20 0505 12/14/20 0451 12/15/20 0440 12/16/20 0445 12/17/20 0444  WBC 9.7 8.7 7.4 4.6 4.1  HGB 13.0 12.8* 12.5* 11.2* 11.2*  HCT 40.7 40.2 39.0 35.1* 35.1*  MCV 92.5 93.5 93.5 93.9 93.4  PLT 241 238 233 218 619   Basic Metabolic Panel: Recent Labs  Lab 12/14/20 0451 12/15/20 0440 12/16/20 0445 12/17/20 0444 12/18/20 0517  NA 137 136 137 137 137  K 3.8 3.7 3.4* 3.8 3.9  CL 102 103 103 104 103  CO2 29 25 27 22 23   GLUCOSE 120* 115* 92 78 65*  BUN 35* 36* 41* 33* 30*  CREATININE 0.92 0.83 0.69 0.60* 0.73  CALCIUM 9.2 9.0 8.7* 8.7* 9.2  MG  --   --   --  2.2  --    GFR: Estimated Creatinine Clearance: 67.4  mL/min (by C-G formula based on SCr of 0.73 mg/dL). Liver Function Tests: Recent Labs  Lab 12/12/20 0338  AST 15  ALT 14  ALKPHOS 38  BILITOT 1.1  PROT 7.0  ALBUMIN 4.1   Recent Labs  Lab 12/12/20 0338  LIPASE 29   No results for input(s): AMMONIA in the last 168 hours. Coagulation Profile: No results for input(s): INR, PROTIME in the last 168 hours. Cardiac Enzymes: No results for input(s): CKTOTAL, CKMB, CKMBINDEX, TROPONINI in the last 168 hours. BNP (last 3 results) No results for input(s): PROBNP in the last 8760 hours. HbA1C: No results for input(s): HGBA1C in the last 72 hours. CBG: No results for input(s): GLUCAP in the last 168 hours. Lipid Profile:  No results for input(s): CHOL, HDL, LDLCALC, TRIG, CHOLHDL, LDLDIRECT in the last 72 hours. Thyroid Function Tests: No results for input(s): TSH, T4TOTAL, FREET4, T3FREE, THYROIDAB in the last 72 hours. Anemia Panel: No results for input(s): VITAMINB12, FOLATE, FERRITIN, TIBC, IRON, RETICCTPCT in the last 72 hours. Sepsis Labs: No results for input(s): PROCALCITON, LATICACIDVEN in the last 168 hours.  Recent Results (from the past 240 hour(s))  Urine culture     Status: None   Collection Time: 12/12/20  3:38 AM   Specimen: Urine, Random  Result Value Ref Range Status   Specimen Description   Final    URINE, RANDOM Performed at [facility] [address] [place], [place] [address]    Special Requests   Final    NONE Performed at [facility], [place] [address], [place], [place] [address]    Culture   Final    NO GROWTH Performed at [facility], [place] [address] [place], [place] [address]    Report Status 12/14/2020 FINAL  Final  SARS CORONAVIRUS 2 (TAT 6-24 HRS) Nasopharyngeal Nasopharyngeal Swab     Status: None   Collection Time: 12/12/20  7:36 AM   Specimen: Nasopharyngeal Swab  Result Value Ref Range Status   SARS Coronavirus 2 NEGATIVE NEGATIVE Final     Comment: (NOTE) SARS-CoV-2 target nucleic acids are NOT DETECTED.  The SARS-CoV-2 RNA is generally detectable in upper and lower respiratory specimens during the acute phase of infection. Negative results do not preclude SARS-CoV-2 infection, do not rule out co-infections with other pathogens, and should not be used as the sole basis for treatment or other patient management decisions. Negative results must be combined with clinical observations, patient history, and epidemiological information. The expected result is Negative.  Fact Sheet for Patients: SugarRoll.be  Fact Sheet for Healthcare Providers: https://www.woods-mathews.com/  This test is not yet approved or cleared by the [place] FDA and  has been authorized for detection and/or diagnosis of SARS-CoV-2 by FDA under an Emergency Use Authorization (EUA). This EUA will remain  in effect (meaning this test can be used) for the duration of the COVID-19 declaration under Se ction 564(b)(1) of the Act, 21 U.S.C. section 360bbb-3(b)(1), unless the authorization is terminated or revoked sooner.  Performed at [facility], [place] [address] [place], [place] [address]       Radiology Studies: DG Abd Portable 1V-Small Bowel Obstruction Protocol-initial, 8 hr delay  Result Date: 12/18/2020 CLINICAL DATA:  Small-bowel obstruction, diffuse abdominal pain EXAM: PORTABLE ABDOMEN - 1 VIEW COMPARISON:  Portable exam 0842 hours compared to 01/01/2016 FINDINGS: Tip of nasogastric tube projects over gastric antrum. Nonspecific bowel gas pattern. Single air-filled nonspecific loop of small bowel in LEFT mid abdomen. No bowel wall thickening. Stool present in rectum. Bones demineralized with degenerative changes of the lumbar spine with scoliosis. Orthopedic hardware at the proximal femora bilaterally. IMPRESSION: Single prominent loop of small bowel in the LEFT mid abdomen without definite  evidence of obstruction. Electronically Signed   By: Lavonia Dana M.D.   On: 12/18/2020 10:53    Scheduled Meds: . chlorhexidine  15 mL Mouth Rinse BID  . diatrizoate meglumine-sodium  90 mL Per NG tube Once  . diatrizoate meglumine-sodium  90 mL Per NG tube Once  . glycopyrrolate  0.4 mg Intravenous TID  . heparin  5,000 Units Subcutaneous Q8H  . hydrALAZINE  5 mg Intravenous Once  . lidocaine  1 patch Transdermal Q24H  .  lip balm  1 application Topical BID  . sodium chloride flush  3 mL Intravenous Q12H   Continuous Infusions: . chlorproMAZINE (THORAZINE) IV Stopped (12/17/20 1006)  . methocarbamol (ROBAXIN) IV 1,000 mg (12/15/20 1012)  . promethazine (PHENERGAN) injection (IM or IVPB)       LOS: 6 days   Time spent: 29 minutes   Darliss Cheney, MD [facility]  12/18/2020, 11:49 AM   How to contact the [facility] Attending or Consulting provider [place] or covering provider during after hours [place], for this patient?  1. Check the care team in [facility] and look for a) attending/consulting [facility] provider listed and b) the [facility] team listed. Page or secure chat [phone]. 2. Log into [email or web address] and use 's universal password to access. If you do not have the password, please contact the hospital operator. 3. Locate the [facility] provider you are looking for under [facility] and page to a number that you can be directly reached. 4. If you still have difficulty reaching the provider, please page the [facility] (Director on Call) for the Hospitalists listed on amion for assistance.

## 2020-12-18 NOTE — Progress Notes (Signed)
[facility] Progress Note     Subjective: CC-  No complaints this morning. States that he has no abdominal pain, nausea, or vomiting. No flatus or BM. NG tube with 650cc output last 24 hours.  Objective: Vital signs in last 24 hours: Temp:  [97.8 F (36.6 C)-98 F (36.7 C)] 97.8 F (36.6 C) (05/27 0438) Pulse Rate:  [64-68] 64 (05/27 0438) Resp:  [18] 18 (05/27 0438) BP: (127-149)/(75-92) 127/75 (05/27 0438) SpO2:  [96 %-97 %] 96 % (05/27 0438) Last BM Date: 12/13/20  Intake/Output from previous day: 05/26 0701 - 05/27 0700 In: 208 [P.O.:180; I.V.:3; IV Piggyback:25] Out: 1950 [Urine:1300; Emesis/NG output:650] Intake/Output this shift: No intake/output data recorded.  PE: Gen: Alert, NAD Pulm: rate and effort normal DPO:EUMP, mild distension, nontender, few BS heard   Lab Results:  Recent Labs    12/16/20 0445 12/17/20 0444  WBC 4.6 4.1  HGB 11.2* 11.2*  HCT 35.1* 35.1*  PLT 218 222   BMET Recent Labs    12/17/20 0444 12/18/20 0517  NA 137 137  K 3.8 3.9  CL 104 103  CO2 22 23  GLUCOSE 78 65*  BUN 33* 30*  CREATININE 0.60* 0.73  CALCIUM 8.7* 9.2   PT/INR No results for input(s): LABPROT, INR in the last 72 hours. CMP     Component Value Date/Time   NA 137 12/18/2020 0517   NA 141 03/02/2017 0753   K 3.9 12/18/2020 0517   CL 103 12/18/2020 0517   CO2 23 12/18/2020 0517   GLUCOSE 65 (L) 12/18/2020 0517   GLUCOSE 100 (H) 05/05/2006 0839   BUN 30 (H) 12/18/2020 0517   BUN 26 03/02/2017 0753   CREATININE 0.73 12/18/2020 0517   CREATININE 0.99 03/10/2014 1445   CALCIUM 9.2 12/18/2020 0517   PROT 7.0 12/12/2020 0338   PROT 5.6 (L) 03/02/2017 0753   ALBUMIN 4.1 12/12/2020 0338   ALBUMIN 3.7 03/02/2017 0753   AST 15 12/12/2020 0338   ALT 14 12/12/2020 0338   ALKPHOS 38 12/12/2020 0338   BILITOT 1.1 12/12/2020 0338   BILITOT 0.6 03/02/2017 0753   GFRNONAA >60 12/18/2020 0517   GFRAA >60 08/20/2019 0438   Lipase     Component  Value Date/Time   LIPASE 29 12/12/2020 0338       Studies/Results: No results found.  Anti-infectives: Anti-infectives (From admission, onward)   Start     Dose/Rate Route Frequency Ordered Stop   12/12/20 0715  cefTRIAXone (ROCEPHIN) 1 g in sodium chloride 0.9 % 100 mL IVPB        1 g 200 mL/hr over 30 Minutes Intravenous  Once 12/12/20 0706 12/12/20 0841       Assessment/Plan CAD s/p CABG Bilateral internal iliac artery aneurysms  Prior AAA repair HTN HLD RBBB Prostate cancer s/p XRT Code status DNR/DNI  Recurrent SBO -difficult NG tube placement - NG placed 5/24 by IR - Patient would bevery high riskwith anyoperative intervention, hopefully we can avoid this.Palliative team following and we appreciate their assistance. If his obstruction does not resolve with conservative measures he is not sure that he would want to undergo any surgery - Will start small bowel obstruction protocol today with gastrograffin and delayed abdominal film. Mobilize as able.   ID -rocephin 5/21 x1 FEN -IVF, NPO/NGT to LIWS VTE -SCDs, sq heparin Foley -none    LOS: 6 days    Wellington Hampshire, [facility] 12/18/2020, 8:41 AM Please see Amion for pager number  during day hours 7:00am-4:30pm

## 2020-12-18 NOTE — Progress Notes (Signed)
Physical Therapy Treatment Patient Details Name: Jesse [name] MRN: [id number] DOB: [birth year] Today's Date: 12/18/2020    History of Present Illness Patient admitted with SBO. Multiple attempts failed for NG tube placement and subsequently it was placed by IR on 12/15/2020.  This is a 87 year old male with past medical history of hypertension, hyperlipidemia, RBBB, CAD s/p CABG, AAA s/p repair and s/p bilateral femoral aneurysm repair in '95, thoracic aneurysm, GI bleed, prostate cancer s/p XRT, bladder tumor s/p resection, distant repair of bowel obstruction, hernia repair with mesh who presented to the ED from home with lower abdominal pain, nausea and dry heaves    PT Comments    Pt assisted with performing stretch for piriformis (reports sharp pain in right buttock and reports "nerve pain" "sciatica").  Pt then assisted with ambulating in hallway and able to tolerate improved distance this afternoon.      Follow Up Recommendations  Home health PT     Equipment Recommendations  None recommended by PT    Recommendations for Other Services       Precautions / Restrictions Precautions Precautions: Fall Precaution Comments: reports history of falls, NG tube to suction    Mobility  Bed Mobility Overal bed mobility: Needs Assistance Bed Mobility: Supine to Sit     Supine to sit: Min assist     General bed mobility comments: pt in recliner    Transfers Overall transfer level: Needs assistance Equipment used: Rolling walker (2 wheeled) Transfers: Sit to/from Stand Sit to Stand: Min assist         General transfer comment: assist to rise from recliner and for controlling descent  Ambulation/Gait Ambulation/Gait assistance: Min guard Gait Distance (Feet): 360 Feet Assistive device: Rolling walker (2 wheeled) Gait Pattern/deviations: Decreased stride length;Step-to pattern;Step-through pattern;Decreased stance time - right;Antalgic Gait velocity: decr   General Gait  Details: verbal cues for weight bearing through UEs to assist with taking weight off Rt LE, increased time, a couple short standing rest breaks   Stairs             Wheelchair Mobility    Modified Rankin (Stroke Patients Only)       Balance                                            Cognition Arousal/Alertness: Awake/alert Behavior During Therapy: WFL for tasks assessed/performed Overall Cognitive Status: Within Functional Limits for tasks assessed                                        Exercises Other Exercises Other Exercises: assisted pt with performing stretch for piriformis with placing right foot across left knee and relaxing into external rotation; hold for 20 sec and performed twice, third time added trunk flexion as tolerated    General Comments        Pertinent Vitals/Pain Pain Assessment: Faces Faces Pain Scale: Hurts even more Pain Location: right buttock ("sciatica") Pain Descriptors / Indicators: Sharp Pain Intervention(s): Repositioned;Monitored during session    Home Living                      Prior Function            PT Goals (current goals can now be found in the  care plan section) Progress towards PT goals: Progressing toward goals    Frequency    Min 3X/week      PT Plan Current plan remains appropriate    Co-evaluation              AM-PAC PT "6 Clicks" Mobility   Outcome Measure  Help needed turning from your back to your side while in a flat bed without using bedrails?: A Little Help needed moving from lying on your back to sitting on the side of a flat bed without using bedrails?: A Little Help needed moving to and from a bed to a chair (including a wheelchair)?: A Little Help needed standing up from a chair using your arms (e.g., wheelchair or bedside chair)?: A Little Help needed to walk in hospital room?: A Little Help needed climbing 3-5 steps with a railing? : A  Little 6 Click Score: 18    End of Session Equipment Utilized During Treatment: Gait belt Activity Tolerance: Patient tolerated treatment well Patient left: with call bell/phone within reach;in chair;with chair alarm set Nurse Communication: Mobility status PT Visit Diagnosis: Other abnormalities of gait and mobility (R26.89)     Time: 9532-0233 PT Time Calculation (min) (ACUTE ONLY): 30 min  Charges:  $Gait Training: 8-22 mins $Therapeutic Exercise: 8-22 mins                    Jannette Spanner PT, DPT Acute Rehabilitation Services Pager: [phone] Office: [phone]   York Ram E 12/18/2020, 3:17 PM

## 2020-12-19 ENCOUNTER — Inpatient Hospital Stay (HOSPITAL_COMMUNITY): Payer: Medicare Other

## 2020-12-19 DIAGNOSIS — K56609 Unspecified intestinal obstruction, unspecified as to partial versus complete obstruction: Secondary | ICD-10-CM | POA: Diagnosis not present

## 2020-12-19 LAB — BASIC METABOLIC PANEL
Anion gap: 17 — ABNORMAL HIGH (ref 5–15)
BUN: 40 mg/dL — ABNORMAL HIGH (ref 8–23)
CO2: 22 mmol/L (ref 22–32)
Calcium: 9.4 mg/dL (ref 8.9–10.3)
Chloride: 102 mmol/L (ref 98–111)
Creatinine, Ser: 0.82 mg/dL (ref 0.61–1.24)
GFR, Estimated: >60 mL/min (ref >60)
Glucose, Bld: 85 mg/dL (ref 70–99)
Potassium: 4.2 mmol/L (ref 3.5–5.1)
Sodium: 141 mmol/L (ref 135–145)

## 2020-12-19 NOTE — Plan of Care (Signed)
  Problem: Education: Goal: Knowledge of General Education information will improve Description: Including pain rating scale, medication(s)/side effects and non-pharmacologic comfort measures Outcome: Progressing   Problem: Health Behavior/Discharge Planning: Goal: Ability to manage health-related needs will improve Outcome: Progressing   Problem: Clinical Measurements: Goal: Will remain free from infection Outcome: Progressing Goal: Diagnostic test results will improve Outcome: Progressing Goal: Cardiovascular complication will be avoided Outcome: Progressing

## 2020-12-19 NOTE — Progress Notes (Signed)
PROGRESS NOTE    Jesse [name]  PIR:[id number] DOB: August 11, 1927 DOA: 12/12/2020 PCP: [name], MD   Brief Narrative:   87 year old male with past medical history of hypertension, hyperlipidemia,RBBB,CAD s/p CABG, AAA s/p repair and s/p bilateral femoral aneurysm repair in '95, thoracic aneurysm, GI bleed,prostate cancer s/p XRT, bladder tumor s/p resection,distant repair of bowel obstruction, hernia repair with mesh who presented to the ED from home with lower abdominal pain and nausea and was Found to have early or partial SBO with transition point along the ventral abdominal wall likely due to adhesions in the setting of prior abdominal surgery with no free air or free fluid, chronic massive bilateral internal iliac artery aneurysms but enlarged since 2019 with subsequent increased mass-effect on the sigmoid colon as it traverses the mid pelvis but no large bowel obstruction, otherwise chronic issues based on the CT abdomen and pelvis.  Patient admitted to hospitalist service with general surgery consulted.  Multiple attempts failed for NG tube placement and subsequently it was placed by IR on 12/15/2020.   Assessment & Plan:   Principal Problem:   SBO (small bowel obstruction) (HCC) Active Problems:   HLD (hyperlipidemia)   Essential hypertension   CAD, ARTERY BYPASS GRAFT   Iliac artery aneurysm, bilateral (HCC)   Aneurysm of abdominal vessel (HCC)   Palliative care by specialist    Small bowel obstruction secondary to adhesions: Feels better and passing flatus.  No BM yet.  General surgery managing and they have clamped the NG tube and started on clears however I was informed by RN that patient was not able to tolerate clears.  She has updated/informed general surgery as well.  Will defer management to surgery.  Abnormal UA /pyuria -patient has no urinary symptoms.  Has history of bladder tumor resection which could explain the pyuria due to inflammation.  Defer antibiotics  and monitor clinically.  Chronic massive bilateral internal iliac artery aneurysms with extensive vascular history -admitting hospitalist discussed with vascular surgeon Dr. Scot Dock.  No acute issues and no intervention or evaluation warranted this admission. Close outpatient follow-up  Hypokalemia: Resolved.  CAD status post CABG /hyperlipidemia/hypertension-stable --Holding aspirin, Coreg, statin, fenofibrate due to n.p.o. status  Patient BMI: Body mass index is 23.73 kg/m.   DVT prophylaxis: heparin injection 5,000 Units Start: 12/12/20 0815   Code Status: DNR  Family Communication:  None present at bedside.  Plan of care discussed with patient in length and he verbalized understanding and agreed with it.    Status is: Inpatient  Remains inpatient appropriate because:Inpatient level of care appropriate due to severity of illness   Dispo: The patient is from: Home              Anticipated d/c is to: Home              Patient currently is not medically stable to d/c.   Difficult to place patient No        Estimated body mass index is 23.73 kg/m as calculated from the following:   Height as of this encounter: 6' (1.829 m).   Weight as of this encounter: 79.4 kg.      Nutritional status:               Consultants:   General surgery  Palliative care  Procedures:   None  Antimicrobials:  Anti-infectives (From admission, onward)   Start     Dose/Rate Route Frequency Ordered Stop   12/12/20 0715  cefTRIAXone (ROCEPHIN) 1  g in sodium chloride 0.9 % 100 mL IVPB        1 g 200 mL/hr over 30 Minutes Intravenous  Once 12/12/20 0706 12/12/20 0841         Subjective: Seen and examined earlier.  Feeling better.  Passing flatus but no BM.  No abdominal pain.  Objective: Vitals:   12/18/20 1214 12/18/20 2021 12/19/20 0244 12/19/20 0435  BP: 118/90 (!) 99/53 118/82 129/73  Pulse: 71 69 73 65  Resp:  18  18  Temp: (!) 96.5 F (35.8 C) 98.3 F  (36.8 C)  98 F (36.7 C)  TempSrc: Axillary Oral    SpO2: 96% 95% 97% 95%  Weight:      Height:        Intake/Output Summary (Last 24 hours) at 12/19/2020 1151 Last data filed at 12/19/2020 0600 Gross per 24 hour  Intake 25 ml  Output 1025 ml  Net -1000 ml   Filed Weights   12/12/20 0323  Weight: 79.4 kg    Examination: General exam: Appears calm and comfortable  Respiratory system: Clear to auscultation. Respiratory effort normal. Cardiovascular system: S1 & S2 heard, RRR. No JVD, murmurs, rubs, gallops or clicks. No pedal edema. Gastrointestinal system: Abdomen is nondistended, soft and nontender. No organomegaly or masses felt. Normal bowel sounds heard. Central nervous system: Alert and oriented. No focal neurological deficits. Extremities: Symmetric 5 x 5 power. Skin: No rashes, lesions or ulcers.  Psychiatry: Judgement and insight appear normal. Mood & affect appropriate.   Data Reviewed: I have personally reviewed following labs and imaging studies  CBC: Recent Labs  Lab 12/13/20 0505 12/14/20 0451 12/15/20 0440 12/16/20 0445 12/17/20 0444  WBC 9.7 8.7 7.4 4.6 4.1  HGB 13.0 12.8* 12.5* 11.2* 11.2*  HCT 40.7 40.2 39.0 35.1* 35.1*  MCV 92.5 93.5 93.5 93.9 93.4  PLT 241 238 233 218 250   Basic Metabolic Panel: Recent Labs  Lab 12/15/20 0440 12/16/20 0445 12/17/20 0444 12/18/20 0517 12/19/20 0424  NA 136 137 137 137 141  K 3.7 3.4* 3.8 3.9 4.2  CL 103 103 104 103 102  CO2 25 27 22 23 22   GLUCOSE 115* 92 78 65* 85  BUN 36* 41* 33* 30* 40*  CREATININE 0.83 0.69 0.60* 0.73 0.82  CALCIUM 9.0 8.7* 8.7* 9.2 9.4  MG  --   --  2.2  --   --    GFR: Estimated Creatinine Clearance: 65.7 mL/min (by C-G formula based on SCr of 0.82 mg/dL). Liver Function Tests: No results for input(s): AST, ALT, ALKPHOS, BILITOT, PROT, ALBUMIN in the last 168 hours. No results for input(s): LIPASE, AMYLASE in the last 168 hours. No results for input(s): AMMONIA in the last  168 hours. Coagulation Profile: No results for input(s): INR, PROTIME in the last 168 hours. Cardiac Enzymes: No results for input(s): CKTOTAL, CKMB, CKMBINDEX, TROPONINI in the last 168 hours. BNP (last 3 results) No results for input(s): PROBNP in the last 8760 hours. HbA1C: No results for input(s): HGBA1C in the last 72 hours. CBG: No results for input(s): GLUCAP in the last 168 hours. Lipid Profile: No results for input(s): CHOL, HDL, LDLCALC, TRIG, CHOLHDL, LDLDIRECT in the last 72 hours. Thyroid Function Tests: No results for input(s): TSH, T4TOTAL, FREET4, T3FREE, THYROIDAB in the last 72 hours. Anemia Panel: No results for input(s): VITAMINB12, FOLATE, FERRITIN, TIBC, IRON, RETICCTPCT in the last 72 hours. Sepsis Labs: No results for input(s): PROCALCITON, LATICACIDVEN in the last 168 hours.  Recent Results (from the past 240 hour(s))  Urine culture     Status: None   Collection Time: 12/12/20  3:38 AM   Specimen: Urine, Random  Result Value Ref Range Status   Specimen Description   Final    URINE, RANDOM Performed at [facility] [address] [place], [place] [address]    Special Requests   Final    NONE Performed at [name], [place] [address], [place], [place] [address]    Culture   Final    NO GROWTH Performed at [facility], [place] [address] [place], [place] [address]    Report Status 12/14/2020 FINAL  Final  SARS CORONAVIRUS 2 (TAT 6-24 HRS) Nasopharyngeal Nasopharyngeal Swab     Status: None   Collection Time: 12/12/20  7:36 AM   Specimen: Nasopharyngeal Swab  Result Value Ref Range Status   SARS Coronavirus 2 NEGATIVE NEGATIVE Final    Comment: (NOTE) SARS-CoV-2 target nucleic acids are NOT DETECTED.  The SARS-CoV-2 RNA is generally detectable in upper and lower respiratory specimens during the acute phase of infection. Negative results do not preclude SARS-CoV-2 infection, do not rule out co-infections  with other pathogens, and should not be used as the sole basis for treatment or other patient management decisions. Negative results must be combined with clinical observations, patient history, and epidemiological information. The expected result is Negative.  Fact Sheet for Patients: SugarRoll.be  Fact Sheet for Healthcare Providers: https://www.woods-mathews.com/  This test is not yet approved or cleared by the [place] FDA and  has been authorized for detection and/or diagnosis of SARS-CoV-2 by FDA under an Emergency Use Authorization (EUA). This EUA will remain  in effect (meaning this test can be used) for the duration of the COVID-19 declaration under Se ction 564(b)(1) of the Act, 21 U.S.C. section 360bbb-3(b)(1), unless the authorization is terminated or revoked sooner.  Performed at [facility], [place] [address], [place],  [address]       Radiology Studies: DG Abd Portable 1V  Result Date: 12/18/2020 CLINICAL DATA:  Small-bowel obstruction 8 hour delay EXAM: PORTABLE ABDOMEN - 1 VIEW COMPARISON:  12/18/2020, CT 12/12/2020 FINDINGS: Contrast is present within nondilated small bowel as well as the right colon. Esophageal tube tip overlies the distal stomach. Multiple surgical clips. IMPRESSION: Contrast has reached the right colon. Contrast is present within multiple nondilated loops of small bowel. Electronically Signed   By: Donavan Foil M.D.   On: 12/18/2020 21:34   DG Abd Portable 1V-Small Bowel Obstruction Protocol-initial, 8 hr delay  Result Date: 12/18/2020 CLINICAL DATA:  Small-bowel obstruction, diffuse abdominal pain EXAM: PORTABLE ABDOMEN - 1 VIEW COMPARISON:  Portable exam 0842 hours compared to 01/01/2016 FINDINGS: Tip of nasogastric tube projects over gastric antrum. Nonspecific bowel gas pattern. Single air-filled nonspecific loop of small bowel in LEFT mid abdomen. No bowel wall thickening. Stool  present in rectum. Bones demineralized with degenerative changes of the lumbar spine with scoliosis. Orthopedic hardware at the proximal femora bilaterally. IMPRESSION: Single prominent loop of small bowel in the LEFT mid abdomen without definite evidence of obstruction. Electronically Signed   By: Lavonia Dana M.D.   On: 12/18/2020 10:53    Scheduled Meds: . chlorhexidine  15 mL Mouth Rinse BID  . diatrizoate meglumine-sodium  90 mL Per NG tube Once  . glycopyrrolate  0.4 mg Intravenous TID  . heparin  5,000 Units Subcutaneous Q8H  . hydrALAZINE  5 mg Intravenous  Once  . lidocaine  1 patch Transdermal Q24H  . lip balm  1 application Topical BID  . sodium chloride flush  3 mL Intravenous Q12H   Continuous Infusions: . chlorproMAZINE (THORAZINE) IV Stopped (12/17/20 1006)  . methocarbamol (ROBAXIN) IV 1,000 mg (12/15/20 1012)  . promethazine (PHENERGAN) injection (IM or IVPB)       LOS: 7 days   Time spent: 26 minutes   Darliss Cheney, MD [facility]  12/19/2020, 11:51 AM   How to contact the [facility] Attending or Consulting provider [place] or covering provider during after hours [place], for this patient?  1. Check the care team in [facility] and look for a) attending/consulting TR[facility] provider listed and b) the [facility] team listed. Page or secure chat [phone]. 2. Log into [email or web address] and use [place] universal password to access. If you do not have the password, please contact the hospital operator. 3. Locate the [facility] provider you are looking for under [facility] and page to a number that you can be directly reached. 4. If you still have difficulty reaching the provider, please page the [facility] (Director on Call) for the Hospitalists listed on amion for assistance.

## 2020-12-19 NOTE — Progress Notes (Addendum)
Patient was started on a clear liquid diet. Pt was only able to take two sips when he began to cough. Pt described feeling as if he were "choking". Pt was encouraged to hold liquids until cleared with provider. Hospitalist and on-call surgeon updated. Education provided to pt and family.

## 2020-12-19 NOTE — Plan of Care (Deleted)
  Problem: Education: Goal: Knowledge of General Education information will improve Description: Including pain rating scale, medication(s)/side effects and non-pharmacologic comfort measures Outcome: Progressing   Problem: Health Behavior/Discharge Planning: Goal: Ability to manage health-related needs will improve Outcome: Progressing   Problem: Clinical Measurements: Goal: Will remain free from infection Outcome: Progressing Goal: Diagnostic test results will improve Outcome: Progressing Goal: Cardiovascular complication will be avoided Outcome: Progressing

## 2020-12-19 NOTE — Progress Notes (Signed)
Subjective/Chief Complaint: Patient reports passing flatus several times this morning.  No BM yet 8 HR films shows contrast in colon - no films today No complaints of abdominal pain, nausea, or bloating   Objective: Vital signs in last 24 hours: Temp:  [96.5 F (35.8 C)-98.3 F (36.8 C)] 98 F (36.7 C) (05/28 0435) Pulse Rate:  [65-73] 65 (05/28 0435) Resp:  [18] 18 (05/28 0435) BP: (99-129)/(53-90) 129/73 (05/28 0435) SpO2:  [95 %-97 %] 95 % (05/28 0435) Last BM Date: 12/13/20  Intake/Output from previous day: 05/27 0701 - 05/28 0700 In: 265 [P.O.:240; IV Piggyback:25] Out: 1050 [Urine:600; Emesis/NG output:450] Intake/Output this shift: No intake/output data recorded.  Gen: Alert, NAD Pulm: rate and effort normal KZS:WFUX, no distension, nontender, fewBSheard   Lab Results:  Recent Labs    12/17/20 0444  WBC 4.1  HGB 11.2*  HCT 35.1*  PLT 222   BMET Recent Labs    12/18/20 0517 12/19/20 0424  NA 137 141  K 3.9 4.2  CL 103 102  CO2 23 22  GLUCOSE 65* 85  BUN 30* 40*  CREATININE 0.73 0.82  CALCIUM 9.2 9.4   PT/INR No results for input(s): LABPROT, INR in the last 72 hours. ABG No results for input(s): PHART, HCO3 in the last 72 hours.  Invalid input(s): PCO2, PO2  Studies/Results: DG Abd Portable 1V  Result Date: 12/18/2020 CLINICAL DATA:  Small-bowel obstruction 8 hour delay EXAM: PORTABLE ABDOMEN - 1 VIEW COMPARISON:  12/18/2020, CT 12/12/2020 FINDINGS: Contrast is present within nondilated small bowel as well as the right colon. Esophageal tube tip overlies the distal stomach. Multiple surgical clips. IMPRESSION: Contrast has reached the right colon. Contrast is present within multiple nondilated loops of small bowel. Electronically Signed   By: Donavan Foil M.D.   On: 12/18/2020 21:34   DG Abd Portable 1V-Small Bowel Obstruction Protocol-initial, 8 hr delay  Result Date: 12/18/2020 CLINICAL DATA:  Small-bowel obstruction, diffuse  abdominal pain EXAM: PORTABLE ABDOMEN - 1 VIEW COMPARISON:  Portable exam 0842 hours compared to 01/01/2016 FINDINGS: Tip of nasogastric tube projects over gastric antrum. Nonspecific bowel gas pattern. Single air-filled nonspecific loop of small bowel in LEFT mid abdomen. No bowel wall thickening. Stool present in rectum. Bones demineralized with degenerative changes of the lumbar spine with scoliosis. Orthopedic hardware at the proximal femora bilaterally. IMPRESSION: Single prominent loop of small bowel in the LEFT mid abdomen without definite evidence of obstruction. Electronically Signed   By: Lavonia Dana M.D.   On: 12/18/2020 10:53    Anti-infectives: Anti-infectives (From admission, onward)   Start     Dose/Rate Route Frequency Ordered Stop   12/12/20 0715  cefTRIAXone (ROCEPHIN) 1 g in sodium chloride 0.9 % 100 mL IVPB        1 g 200 mL/hr over 30 Minutes Intravenous  Once 12/12/20 0706 12/12/20 0841      Assessment/Plan: CAD s/p CABG Bilateral internal iliac artery aneurysms  Prior AAA repair HTN HLD RBBB Prostate cancer s/p XRT Code status DNR/DNI  Recurrent SBO -difficult NG tube placement -NG placed 5/24 by IR - Patient would bevery high riskwith anyoperative intervention, hopefully we can avoid this.Palliative team following and we appreciate their assistance. If his obstruction does not resolve with conservative measures he is not sure that he would want to undergo any surgery - SBO protocol - contrast in colon; will clamp NG tube; start clear liquids  ID -rocephin 5/21 x1 FEN -IVF, clamp NG tube, CL VTE -SCDs,  sq heparin Foley -none   LOS: 7 days    Maia Petties 12/19/2020

## 2020-12-20 DIAGNOSIS — K56609 Unspecified intestinal obstruction, unspecified as to partial versus complete obstruction: Secondary | ICD-10-CM | POA: Diagnosis not present

## 2020-12-20 MED ORDER — KETOROLAC TROMETHAMINE 15 MG/ML IJ SOLN
15.0000 mg | Freq: Once | INTRAMUSCULAR | Status: AC
  Administered 2020-12-20: 15 mg via INTRAVENOUS
  Filled 2020-12-20: qty 1

## 2020-12-20 NOTE — Progress Notes (Signed)
PROGRESS NOTE    Jesse [name]  ZHY:[id number] DOB: 06/23/1928 DOA: 12/12/2020 PCP: [name], MD   Brief Narrative:   87 year old male with past medical history of hypertension, hyperlipidemia,RBBB,CAD s/p CABG, AAA s/p repair and s/p bilateral femoral aneurysm repair in '95, thoracic aneurysm, GI bleed,prostate cancer s/p XRT, bladder tumor s/p resection,distant repair of bowel obstruction, hernia repair with mesh who presented to the ED from home with lower abdominal pain and nausea and was Found to have early or partial SBO with transition point along the ventral abdominal wall likely due to adhesions in the setting of prior abdominal surgery with no free air or free fluid, chronic massive bilateral internal iliac artery aneurysms but enlarged since 2019 with subsequent increased mass-effect on the sigmoid colon as it traverses the mid pelvis but no large bowel obstruction, otherwise chronic issues based on the CT abdomen and pelvis.  Patient admitted to hospitalist service with general surgery consulted.  Multiple attempts failed for NG tube placement and subsequently it was placed by IR on 12/15/2020.   Assessment & Plan:   Principal Problem:   SBO (small bowel obstruction) (HCC) Active Problems:   HLD (hyperlipidemia)   Essential hypertension   CAD, ARTERY BYPASS GRAFT   Iliac artery aneurysm, bilateral (HCC)   Aneurysm of abdominal vessel (HCC)   Palliative care by specialist    Small bowel obstruction secondary to adhesions: Continues to feel well.  Does not remember if he was passing flatus or not but definitely has not had any bowel movement.  Denies any nausea or vomiting and tolerating clears as well. General surgery managing and they plan to keep NG tube in place and continued him on clears and awaiting bowel function to return.  Contrast shows in the colon.  Abnormal UA /pyuria -patient has no urinary symptoms.  Has history of bladder tumor resection which could  explain the pyuria due to inflammation.  Defer antibiotics and monitor clinically.  Chronic massive bilateral internal iliac artery aneurysms with extensive vascular history -admitting hospitalist discussed with vascular surgeon Dr. Scot Dock.  No acute issues and no intervention or evaluation warranted this admission. Close outpatient follow-up  Hypokalemia: Resolved.  CAD status post CABG /hyperlipidemia/hypertension-stable --Holding aspirin, Coreg, statin, fenofibrate due to n.p.o. status  Patient BMI: Body mass index is 23.73 kg/m.   DVT prophylaxis: heparin injection 5,000 Units Start: 12/12/20 0815   Code Status: DNR  Family Communication:  None present at bedside.  Plan of care discussed with patient in length and he verbalized understanding and agreed with it.  Also informed/updated his son [name] over the phone.  Status is: Inpatient  Remains inpatient appropriate because:Inpatient level of care appropriate due to severity of illness   Dispo: The patient is from: Home              Anticipated d/c is to: Home              Patient currently is not medically stable to d/c.   Difficult to place patient No        Estimated body mass index is 23.73 kg/m as calculated from the following:   Height as of this encounter: 6' (1.829 m).   Weight as of this encounter: 79.4 kg.      Nutritional status:               Consultants:   General surgery  Palliative care  Procedures:   None  Antimicrobials:  Anti-infectives (From admission, onward)  Start     Dose/Rate Route Frequency Ordered Stop   12/12/20 0715  cefTRIAXone (ROCEPHIN) 1 g in sodium chloride 0.9 % 100 mL IVPB        1 g 200 mL/hr over 30 Minutes Intravenous  Once 12/12/20 0706 12/12/20 0841         Subjective: Seen and examined.  He has no complaints.  No nausea.  Tolerating clears.  Does not remember if he is passing flatus.  No bowel movement.  Objective: Vitals:   12/19/20 0435  12/19/20 1231 12/19/20 2141 12/20/20 0447  BP: 129/73 118/83 123/80 130/86  Pulse: 65 77 82 69  Resp: 18 18 16 16   Temp: 98 F (36.7 C) 98.1 F (36.7 C) 98.2 F (36.8 C) 97.6 F (36.4 C)  TempSrc:  Oral Oral Oral  SpO2: 95% 97% 95% 96%  Weight:      Height:        Intake/Output Summary (Last 24 hours) at 12/20/2020 1122 Last data filed at 12/20/2020 0600 Gross per 24 hour  Intake --  Output 825 ml  Net -825 ml   Filed Weights   12/12/20 0323  Weight: 79.4 kg    Examination: General exam: Appears calm and comfortable  Respiratory system: Clear to auscultation. Respiratory effort normal. Cardiovascular system: S1 & S2 heard, RRR. No JVD, murmurs, rubs, gallops or clicks. No pedal edema. Gastrointestinal system: Abdomen is slightly distended with reducible ventral hernia, soft and nontender. No organomegaly or masses felt. Normal bowel sounds heard. Central nervous system: Alert and oriented. No focal neurological deficits. Extremities: Symmetric 5 x 5 power. Skin: No rashes, lesions or ulcers.  Psychiatry: Judgement and insight appear normal. Mood & affect appropriate.    Data Reviewed: I have personally reviewed following labs and imaging studies  CBC: Recent Labs  Lab 12/14/20 0451 12/15/20 0440 12/16/20 0445 12/17/20 0444  WBC 8.7 7.4 4.6 4.1  HGB 12.8* 12.5* 11.2* 11.2*  HCT 40.2 39.0 35.1* 35.1*  MCV 93.5 93.5 93.9 93.4  PLT 238 233 218 283   Basic Metabolic Panel: Recent Labs  Lab 12/15/20 0440 12/16/20 0445 12/17/20 0444 12/18/20 0517 12/19/20 0424  NA 136 137 137 137 141  K 3.7 3.4* 3.8 3.9 4.2  CL 103 103 104 103 102  CO2 25 27 22 23 22   GLUCOSE 115* 92 78 65* 85  BUN 36* 41* 33* 30* 40*  CREATININE 0.83 0.69 0.60* 0.73 0.82  CALCIUM 9.0 8.7* 8.7* 9.2 9.4  MG  --   --  2.2  --   --    GFR: Estimated Creatinine Clearance: 65.7 mL/min (by C-G formula based on SCr of 0.82 mg/dL). Liver Function Tests: No results for input(s): AST, ALT,  ALKPHOS, BILITOT, PROT, ALBUMIN in the last 168 hours. No results for input(s): LIPASE, AMYLASE in the last 168 hours. No results for input(s): AMMONIA in the last 168 hours. Coagulation Profile: No results for input(s): INR, PROTIME in the last 168 hours. Cardiac Enzymes: No results for input(s): CKTOTAL, CKMB, CKMBINDEX, TROPONINI in the last 168 hours. BNP (last 3 results) No results for input(s): PROBNP in the last 8760 hours. HbA1C: No results for input(s): HGBA1C in the last 72 hours. CBG: No results for input(s): GLUCAP in the last 168 hours. Lipid Profile: No results for input(s): CHOL, HDL, LDLCALC, TRIG, CHOLHDL, LDLDIRECT in the last 72 hours. Thyroid Function Tests: No results for input(s): TSH, T4TOTAL, FREET4, T3FREE, THYROIDAB in the last 72 hours. Anemia Panel: No results  for input(s): VITAMINB12, FOLATE, FERRITIN, TIBC, IRON, RETICCTPCT in the last 72 hours. Sepsis Labs: No results for input(s): PROCALCITON, LATICACIDVEN in the last 168 hours.  Recent Results (from the past 240 hour(s))  Urine culture     Status: None   Collection Time: 12/12/20  3:38 AM   Specimen: Urine, Random  Result Value Ref Range Status   Specimen Description   Final    URINE, RANDOM Performed at [facility] [address], [place], [place] [address]    Special Requests   Final    NONE Performed at [facility], [place] [address] [place], [place] [address]    Culture   Final    NO GROWTH Performed at [facility], [place] [address] [place], [place] [address]    Report Status 12/14/2020 FINAL  Final  SARS CORONAVIRUS 2 (TAT 6-24 HRS) Nasopharyngeal Nasopharyngeal Swab     Status: None   Collection Time: 12/12/20  7:36 AM   Specimen: Nasopharyngeal Swab  Result Value Ref Range Status   SARS Coronavirus 2 NEGATIVE NEGATIVE Final    Comment: (NOTE) SARS-CoV-2 target nucleic acids are NOT DETECTED.  The SARS-CoV-2 RNA is generally detectable  in upper and lower respiratory specimens during the acute phase of infection. Negative results do not preclude SARS-CoV-2 infection, do not rule out co-infections with other pathogens, and should not be used as the sole basis for treatment or other patient management decisions. Negative results must be combined with clinical observations, patient history, and epidemiological information. The expected result is Negative.  Fact Sheet for Patients: SugarRoll.be  Fact Sheet for Healthcare Providers: https://www.woods-mathews.com/  This test is not yet approved or cleared by the [place] FDA and  has been authorized for detection and/or diagnosis of SARS-CoV-2 by FDA under an Emergency Use Authorization (EUA). This EUA will remain  in effect (meaning this test can be used) for the duration of the COVID-19 declaration under Se ction 564(b)(1) of the Act, 21 U.S.C. section 360bbb-3(b)(1), unless the authorization is terminated or revoked sooner.  Performed at [facility], [place] [address] [place], [place] [address]       Radiology Studies: DG Abd Portable 1V-Small Bowel Obstruction Protocol-24 hr delay  Result Date: 12/19/2020 CLINICAL DATA:  24 hour delayed image for a small bowel obstruction. EXAM: PORTABLE ABDOMEN - 1 VIEW COMPARISON:  Dec 18, 2020 FINDINGS: Oral contrast opacifies parts of the colon and distal small bowel. Enteric catheter overlies expected location of gastric body. Stable postsurgical changes in the abdomen. IMPRESSION: Oral contrast opacifies parts of the colon and distal small bowel, advanced from the prior radiograph dated Dec 18, 2020 at 9:07 p.m. Electronically Signed   By: Fidela Salisbury M.D.   On: 12/19/2020 14:16   DG Abd Portable 1V  Result Date: 12/18/2020 CLINICAL DATA:  Small-bowel obstruction 8 hour delay EXAM: PORTABLE ABDOMEN - 1 VIEW COMPARISON:  12/18/2020, CT 12/12/2020 FINDINGS: Contrast is  present within nondilated small bowel as well as the right colon. Esophageal tube tip overlies the distal stomach. Multiple surgical clips. IMPRESSION: Contrast has reached the right colon. Contrast is present within multiple nondilated loops of small bowel. Electronically Signed   By: Donavan Foil M.D.   On: 12/18/2020 21:34    Scheduled Meds: . chlorhexidine  15 mL Mouth Rinse BID  . diatrizoate meglumine-sodium  90 mL Per NG tube Once  . glycopyrrolate  0.4 mg Intravenous TID  . heparin  5,000 Units  Subcutaneous Q8H  . hydrALAZINE  5 mg Intravenous Once  . lidocaine  1 patch Transdermal Q24H  . lip balm  1 application Topical BID  . sodium chloride flush  3 mL Intravenous Q12H   Continuous Infusions: . chlorproMAZINE (THORAZINE) IV Stopped (12/17/20 1006)  . methocarbamol (ROBAXIN) IV 1,000 mg (12/15/20 1012)  . promethazine (PHENERGAN) injection (IM or IVPB)       LOS: 8 days   Time spent: 26 minutes   Darliss Cheney, MD [facility]  12/20/2020, 11:22 AM   How to contact the [facility] Attending or Consulting provider [place] or covering provider during after hours [place], for this patient?  1. Check the care team in [facility] and look for a) attending/consulting [facility] provider listed and b) the [facility] team listed. Page or secure chat [phone]. 2. Log into [email or web address] and use [place] universal password to access. If you do not have the password, please contact the hospital operator. 3. Locate the [facility] provider you are looking for under [facility] and page to a number that you can be directly reached. 4. If you still have difficulty reaching the provider, please page the [facility] (Director on Call) for the Hospitalists listed on amion for assistance.

## 2020-12-20 NOTE — Progress Notes (Signed)
Subjective/Chief Complaint: Patient has been clamped for about a day.  No nausea or vomiting Reports no flatus or BM that he can remember Tolerating clears Repeat films show contrast progressing in the colon   Objective: Vital signs in last 24 hours: Temp:  [97.6 F (36.4 C)-98.2 F (36.8 C)] 97.6 F (36.4 C) (05/29 0447) Pulse Rate:  [69-82] 69 (05/29 0447) Resp:  [16-18] 16 (05/29 0447) BP: (118-130)/(80-86) 130/86 (05/29 0447) SpO2:  [95 %-97 %] 96 % (05/29 0447) Last BM Date: 12/13/20  Intake/Output from previous day: 05/28 0701 - 05/29 0700 In: -  Out: 825 [Urine:825] Intake/Output this shift: No intake/output data recorded.  Gen: Alert, NAD Pulm: rate and effort normal EGB:TDVV, no distension, nontender,ventral hernia reducible  Lab Results:  No results for input(s): WBC, HGB, HCT, PLT in the last 72 hours. BMET Recent Labs    12/18/20 0517 12/19/20 0424  NA 137 141  K 3.9 4.2  CL 103 102  CO2 23 22  GLUCOSE 65* 85  BUN 30* 40*  CREATININE 0.73 0.82  CALCIUM 9.2 9.4   PT/INR No results for input(s): LABPROT, INR in the last 72 hours. ABG No results for input(s): PHART, HCO3 in the last 72 hours.  Invalid input(s): PCO2, PO2  Studies/Results: DG Abd Portable 1V-Small Bowel Obstruction Protocol-24 hr delay  Result Date: 12/19/2020 CLINICAL DATA:  24 hour delayed image for a small bowel obstruction. EXAM: PORTABLE ABDOMEN - 1 VIEW COMPARISON:  Dec 18, 2020 FINDINGS: Oral contrast opacifies parts of the colon and distal small bowel. Enteric catheter overlies expected location of gastric body. Stable postsurgical changes in the abdomen. IMPRESSION: Oral contrast opacifies parts of the colon and distal small bowel, advanced from the prior radiograph dated Dec 18, 2020 at 9:07 p.m. Electronically Signed   By: Fidela Salisbury M.D.   On: 12/19/2020 14:16   DG Abd Portable 1V  Result Date: 12/18/2020 CLINICAL DATA:  Small-bowel obstruction 8 hour  delay EXAM: PORTABLE ABDOMEN - 1 VIEW COMPARISON:  12/18/2020, CT 12/12/2020 FINDINGS: Contrast is present within nondilated small bowel as well as the right colon. Esophageal tube tip overlies the distal stomach. Multiple surgical clips. IMPRESSION: Contrast has reached the right colon. Contrast is present within multiple nondilated loops of small bowel. Electronically Signed   By: Donavan Foil M.D.   On: 12/18/2020 21:34   DG Abd Portable 1V-Small Bowel Obstruction Protocol-initial, 8 hr delay  Result Date: 12/18/2020 CLINICAL DATA:  Small-bowel obstruction, diffuse abdominal pain EXAM: PORTABLE ABDOMEN - 1 VIEW COMPARISON:  Portable exam 0842 hours compared to 01/01/2016 FINDINGS: Tip of nasogastric tube projects over gastric antrum. Nonspecific bowel gas pattern. Single air-filled nonspecific loop of small bowel in LEFT mid abdomen. No bowel wall thickening. Stool present in rectum. Bones demineralized with degenerative changes of the lumbar spine with scoliosis. Orthopedic hardware at the proximal femora bilaterally. IMPRESSION: Single prominent loop of small bowel in the LEFT mid abdomen without definite evidence of obstruction. Electronically Signed   By: Lavonia Dana M.D.   On: 12/18/2020 10:53    Anti-infectives: Anti-infectives (From admission, onward)   Start     Dose/Rate Route Frequency Ordered Stop   12/12/20 0715  cefTRIAXone (ROCEPHIN) 1 g in sodium chloride 0.9 % 100 mL IVPB        1 g 200 mL/hr over 30 Minutes Intravenous  Once 12/12/20 0706 12/12/20 0841      Assessment/Plan: CAD s/p CABG Bilateral internal iliac artery aneurysms  Prior AAA  repair Ventral hernia - reducible HTN HLD RBBB Prostate cancer s/p XRT Code status DNR/DNI  Recurrent SBO -difficult NG tube placement -NG placed 5/24 by IR - Patient would bevery high riskwith anyoperative intervention, hopefully we can avoid this.Palliative team followingand we appreciate their assistance. If his  obstruction does not resolve with conservative measures he is not sure that he would want to undergo any surgery - SBO protocol - contrast further in colon; continue to  clamp NG tube; clear liquids; encourage ambulation.  Do not want to remove NG too early due to difficulty in placing it.  Awaiting return of bowel function.  LOS: 8 days    Jesse [name] 12/20/2020

## 2020-12-21 ENCOUNTER — Inpatient Hospital Stay (HOSPITAL_COMMUNITY): Payer: Medicare Other

## 2020-12-21 DIAGNOSIS — Z515 Encounter for palliative care: Secondary | ICD-10-CM | POA: Diagnosis not present

## 2020-12-21 DIAGNOSIS — K56609 Unspecified intestinal obstruction, unspecified as to partial versus complete obstruction: Secondary | ICD-10-CM | POA: Diagnosis not present

## 2020-12-21 LAB — BASIC METABOLIC PANEL
Anion gap: 7 (ref 5–15)
BUN: 37 mg/dL — ABNORMAL HIGH (ref 8–23)
CO2: 27 mmol/L (ref 22–32)
Calcium: 9.4 mg/dL (ref 8.9–10.3)
Chloride: 105 mmol/L (ref 98–111)
Creatinine, Ser: 0.76 mg/dL (ref 0.61–1.24)
GFR, Estimated: >60 mL/min (ref >60)
Glucose, Bld: 114 mg/dL — ABNORMAL HIGH (ref 70–99)
Potassium: 3.7 mmol/L (ref 3.5–5.1)
Sodium: 139 mmol/L (ref 135–145)

## 2020-12-21 MED ORDER — GLYCOPYRROLATE 0.2 MG/ML IJ SOLN
0.2000 mg | Freq: Three times a day (TID) | INTRAMUSCULAR | Status: DC
  Administered 2020-12-21 (×2): 0.2 mg via INTRAVENOUS
  Filled 2020-12-21 (×4): qty 1

## 2020-12-21 NOTE — Progress Notes (Signed)
PROGRESS NOTE    Jesse [name]  BMW:[id number] DOB: [birth year] DOA: 12/12/2020 PCP: [name], MD   Brief Narrative:   87 year old male with past medical history of hypertension, hyperlipidemia,RBBB,CAD s/p CABG, AAA s/p repair and s/p bilateral femoral aneurysm repair in '95, thoracic aneurysm, GI bleed,prostate cancer s/p XRT, bladder tumor s/p resection,distant repair of bowel obstruction, hernia repair with mesh who presented to the ED from home with lower abdominal pain and nausea and was Found to have early or partial SBO with transition point along the ventral abdominal wall likely due to adhesions in the setting of prior abdominal surgery with no free air or free fluid, chronic massive bilateral internal iliac artery aneurysms but enlarged since 2019 with subsequent increased mass-effect on the sigmoid colon as it traverses the mid pelvis but no large bowel obstruction, otherwise chronic issues based on the CT abdomen and pelvis.  Patient admitted to hospitalist service with general surgery consulted.  Multiple attempts failed for NG tube placement and subsequently it was placed by IR on 12/15/2020.   Assessment & Plan:   Principal Problem:   SBO (small bowel obstruction) (HCC) Active Problems:   HLD (hyperlipidemia)   Essential hypertension   CAD, ARTERY BYPASS GRAFT   Iliac artery aneurysm, bilateral (HCC)   Aneurysm of abdominal vessel (HCC)   Palliative care by specialist    Small bowel obstruction secondary to adhesions: Now he remembers that he has been passing flatus but has not had any bowel movement.  No abdominal pain however on examination, his abdomen is more distended compared to yesterday, although nontender.  Tolerating clears.  Discussed with Dr. Georgette Dover and since it was very hard to place NG tube in place, we will leave it in place for another day and reassess tomorrow.  Surgery to order a few more labs.  Abnormal UA /pyuria -patient has no urinary symptoms.   Has history of bladder tumor resection which could explain the pyuria due to inflammation.  Defer antibiotics and monitor clinically.  Chronic massive bilateral internal iliac artery aneurysms with extensive vascular history -admitting hospitalist discussed with vascular surgeon Dr. Scot Dock.  No acute issues and no intervention or evaluation warranted this admission. Close outpatient follow-up  Hypokalemia: Resolved.  CAD status post CABG /hyperlipidemia/hypertension-stable --Holding aspirin, Coreg, statin, fenofibrate due to n.p.o. status  Patient BMI: Body mass index is 23.73 kg/m.   DVT prophylaxis: heparin injection 5,000 Units Start: 12/12/20 0815   Code Status: DNR  Family Communication: Patient's son [name] present at the bedside.  Plan of care discussed with patient and his son.  Status is: Inpatient  Remains inpatient appropriate because:Inpatient level of care appropriate due to severity of illness   Dispo: The patient is from: Home              Anticipated d/c is to: Home              Patient currently is not medically stable to d/c.   Difficult to place patient No        Estimated body mass index is 23.73 kg/m as calculated from the following:   Height as of this encounter: 6' (1.829 m).   Weight as of this encounter: 79.4 kg.      Nutritional status:               Consultants:   General surgery  Palliative care  Procedures:   None  Antimicrobials:  Anti-infectives (From admission, onward)   Start  Dose/Rate Route Frequency Ordered Stop   12/12/20 0715  cefTRIAXone (ROCEPHIN) 1 g in sodium chloride 0.9 % 100 mL IVPB        1 g 200 mL/hr over 30 Minutes Intravenous  Once 12/12/20 0706 12/12/20 0841         Subjective: Seen and examined.  Feels better than yesterday.  No abdominal pain but slightly more distended compared to yesterday.  Passing flatus but no bowel movement.  Son at the bedside.  Objective: Vitals:    12/20/20 1426 12/20/20 2200 12/21/20 0259 12/21/20 0629  BP: 111/70 116/72 117/74 128/76  Pulse: 79 90 67 (!) 59  Resp: 20 17 20 18   Temp: (!) 97.5 F (36.4 C) 99.7 F (37.6 C) 97.7 F (36.5 C) (!) 97.4 F (36.3 C)  TempSrc: Oral Oral    SpO2: 97% 94% 94% 94%  Weight:      Height:        Intake/Output Summary (Last 24 hours) at 12/21/2020 1157 Last data filed at 12/21/2020 1000 Gross per 24 hour  Intake 120 ml  Output --  Net 120 ml   Filed Weights   12/12/20 0323  Weight: 79.4 kg    Examination: General exam: Appears calm and comfortable  Respiratory system: Clear to auscultation. Respiratory effort normal. Cardiovascular system: S1 & S2 heard, RRR. No JVD, murmurs, rubs, gallops or clicks. No pedal edema. Gastrointestinal system: Abdomen is moderately and slightly more distended compared to yesterday although it is soft and nontender. No organomegaly or masses felt. Normal bowel sounds heard. Central nervous system: Alert and oriented. No focal neurological deficits. Extremities: Symmetric 5 x 5 power. Skin: No rashes, lesions or ulcers.  Psychiatry: Judgement and insight appear normal. Mood & affect appropriate.   Data Reviewed: I have personally reviewed following labs and imaging studies  CBC: Recent Labs  Lab 12/15/20 0440 12/16/20 0445 12/17/20 0444  WBC 7.4 4.6 4.1  HGB 12.5* 11.2* 11.2*  HCT 39.0 35.1* 35.1*  MCV 93.5 93.9 93.4  PLT 233 218 115   Basic Metabolic Panel: Recent Labs  Lab 12/16/20 0445 12/17/20 0444 12/18/20 0517 12/19/20 0424 12/21/20 0519  NA 137 137 137 141 139  K 3.4* 3.8 3.9 4.2 3.7  CL 103 104 103 102 105  CO2 27 22 23 22 27   GLUCOSE 92 78 65* 85 114*  BUN 41* 33* 30* 40* 37*  CREATININE 0.69 0.60* 0.73 0.82 0.76  CALCIUM 8.7* 8.7* 9.2 9.4 9.4  MG  --  2.2  --   --   --    GFR: Estimated Creatinine Clearance: 67.4 mL/min (by C-G formula based on SCr of 0.76 mg/dL). Liver Function Tests: No results for input(s): AST,  ALT, ALKPHOS, BILITOT, PROT, ALBUMIN in the last 168 hours. No results for input(s): LIPASE, AMYLASE in the last 168 hours. No results for input(s): AMMONIA in the last 168 hours. Coagulation Profile: No results for input(s): INR, PROTIME in the last 168 hours. Cardiac Enzymes: No results for input(s): CKTOTAL, CKMB, CKMBINDEX, TROPONINI in the last 168 hours. BNP (last 3 results) No results for input(s): PROBNP in the last 8760 hours. HbA1C: No results for input(s): HGBA1C in the last 72 hours. CBG: No results for input(s): GLUCAP in the last 168 hours. Lipid Profile: No results for input(s): CHOL, HDL, LDLCALC, TRIG, CHOLHDL, LDLDIRECT in the last 72 hours. Thyroid Function Tests: No results for input(s): TSH, T4TOTAL, FREET4, T3FREE, THYROIDAB in the last 72 hours. Anemia Panel: No results for  input(s): VITAMINB12, FOLATE, FERRITIN, TIBC, IRON, RETICCTPCT in the last 72 hours. Sepsis Labs: No results for input(s): PROCALCITON, LATICACIDVEN in the last 168 hours.  Recent Results (from the past 240 hour(s))  Urine culture     Status: None   Collection Time: 12/12/20  3:38 AM   Specimen: Urine, Random  Result Value Ref Range Status   Specimen Description   Final    URINE, RANDOM Performed at [facility] [address] [place], [place] [address]    Special Requests   Final    NONE Performed at [facility], [place] [address], [place], [place] [address]    Culture   Final    NO GROWTH Performed at [facility], [place] [address], [place], [place] [address]    Report Status 12/14/2020 FINAL  Final  SARS CORONAVIRUS 2 (TAT 6-24 HRS) Nasopharyngeal Nasopharyngeal Swab     Status: None   Collection Time: 12/12/20  7:36 AM   Specimen: Nasopharyngeal Swab  Result Value Ref Range Status   SARS Coronavirus 2 NEGATIVE NEGATIVE Final    Comment: (NOTE) SARS-CoV-2 target nucleic acids are NOT DETECTED.  The SARS-CoV-2 RNA is generally  detectable in upper and lower respiratory specimens during the acute phase of infection. Negative results do not preclude SARS-CoV-2 infection, do not rule out co-infections with other pathogens, and should not be used as the sole basis for treatment or other patient management decisions. Negative results must be combined with clinical observations, patient history, and epidemiological information. The expected result is Negative.  Fact Sheet for Patients: SugarRoll.be  Fact Sheet for Healthcare Providers: https://www.woods-mathews.com/  This test is not yet approved or cleared by the [place] FDA and  has been authorized for detection and/or diagnosis of SARS-CoV-2 by FDA under an Emergency Use Authorization (EUA). This EUA will remain  in effect (meaning this test can be used) for the duration of the COVID-19 declaration under Se ction 564(b)(1) of the Act, 21 U.S.C. section 360bbb-3(b)(1), unless the authorization is terminated or revoked sooner.  Performed at [facility], [place] [address] [place], [place] [address]       Radiology Studies: DG Abd Portable 1V-Small Bowel Obstruction Protocol-24 hr delay  Result Date: 12/19/2020 CLINICAL DATA:  24 hour delayed image for a small bowel obstruction. EXAM: PORTABLE ABDOMEN - 1 VIEW COMPARISON:  Dec 18, 2020 FINDINGS: Oral contrast opacifies parts of the colon and distal small bowel. Enteric catheter overlies expected location of gastric body. Stable postsurgical changes in the abdomen. IMPRESSION: Oral contrast opacifies parts of the colon and distal small bowel, advanced from the prior radiograph dated Dec 18, 2020 at 9:07 p.m. Electronically Signed   By: Fidela Salisbury M.D.   On: 12/19/2020 14:16    Scheduled Meds: . chlorhexidine  15 mL Mouth Rinse BID  . diatrizoate meglumine-sodium  90 mL Per NG tube Once  . glycopyrrolate  0.2 mg Intravenous TID  . heparin  5,000 Units  Subcutaneous Q8H  . hydrALAZINE  5 mg Intravenous Once  . lidocaine  1 patch Transdermal Q24H  . lip balm  1 application Topical BID  . sodium chloride flush  3 mL Intravenous Q12H   Continuous Infusions: . chlorproMAZINE (THORAZINE) IV Stopped (12/17/20 1006)  . methocarbamol (ROBAXIN) IV 1,000 mg (12/20/20 1305)  . promethazine (PHENERGAN) injection (IM or IVPB)       LOS: 9 days   Time spent: 29 minutes   Darliss Cheney, MD  [facility]  12/21/2020, 11:57 AM   How to contact the [facility] Attending or Consulting provider [place] or covering provider during after hours [place], for this patient?  1. Check the care team in [facility] and look for a) attending/consulting TR[facility] provider listed and b) the [facility] team listed. Page or secure chat [phone]. 2. Log into [email or web address] and use 's universal password to access. If you do not have the password, please contact the hospital operator. 3. Locate the [facility] provider you are looking for under [facility] and page to a number that you can be directly reached. 4. If you still have difficulty reaching the provider, please page the [name] (Director on Call) for the Hospitalists listed on amion for assistance.

## 2020-12-21 NOTE — Progress Notes (Signed)
Physical Therapy Treatment Patient Details Name: Jesse [name] MRN: [id number] DOB: 1927/08/03 Today's Date: 12/21/2020    History of Present Illness Patient admitted with SBO. Multiple attempts failed for NG tube placement and subsequently it was placed by IR on 12/15/2020.  This is a 87 year old male with past medical history of hypertension, hyperlipidemia, RBBB, CAD s/p CABG, AAA s/p repair and s/p bilateral femoral aneurysm repair in '95, thoracic aneurysm, GI bleed, prostate cancer s/p XRT, bladder tumor s/p resection, distant repair of bowel obstruction, hernia repair with mesh who presented to the ED from home with lower abdominal pain, nausea and dry heaves    PT Comments    Pt reports sciatica pain much improved today and able to tolerate improved ambulation distance.  Pt encouraged to ambulate again today with family or nursing staff.   Follow Up Recommendations  Home health PT     Equipment Recommendations  None recommended by PT    Recommendations for Other Services       Precautions / Restrictions Precautions Precautions: Fall Precaution Comments: reports history of falls, NG tube clamped    Mobility  Bed Mobility               General bed mobility comments: pt in recliner    Transfers Overall transfer level: Needs assistance Equipment used: Rolling walker (2 wheeled) Transfers: Sit to/from Stand Sit to Stand: Min guard         General transfer comment: cues for hand placement  Ambulation/Gait Ambulation/Gait assistance: Min guard Gait Distance (Feet): 360 Feet Assistive device: Rolling walker (2 wheeled) Gait Pattern/deviations: Decreased stride length;Step-through pattern;Trunk flexed     General Gait Details: verbal cues for posture and distance from RW, pt reports sciatica pain much improved   Stairs             Wheelchair Mobility    Modified Rankin (Stroke Patients Only)       Balance                                             Cognition Arousal/Alertness: Awake/alert Behavior During Therapy: WFL for tasks assessed/performed Overall Cognitive Status: Within Functional Limits for tasks assessed                                        Exercises      General Comments        Pertinent Vitals/Pain Pain Assessment: Faces Faces Pain Scale: Hurts a little bit Pain Location: right buttock ("sciatica") Pain Descriptors / Indicators: Dull Pain Intervention(s): Repositioned;Monitored during session    Home Living                      Prior Function            PT Goals (current goals can now be found in the care plan section) Progress towards PT goals: Progressing toward goals    Frequency    Min 3X/week      PT Plan Current plan remains appropriate    Co-evaluation              AM-PAC PT "6 Clicks" Mobility   Outcome Measure  Help needed turning from your back to your side while in a flat bed without using bedrails?:  A Little Help needed moving from lying on your back to sitting on the side of a flat bed without using bedrails?: A Little Help needed moving to and from a bed to a chair (including a wheelchair)?: A Little Help needed standing up from a chair using your arms (e.g., wheelchair or bedside chair)?: A Little Help needed to walk in hospital room?: A Little Help needed climbing 3-5 steps with a railing? : A Little 6 Click Score: 18    End of Session Equipment Utilized During Treatment: Gait belt Activity Tolerance: Patient tolerated treatment well Patient left: with call bell/phone within reach;in chair;with chair alarm set;with family/visitor present Nurse Communication: Mobility status PT Visit Diagnosis: Other abnormalities of gait and mobility (R26.89)     Time: 1111-1130 PT Time Calculation (min) (ACUTE ONLY): 19 min  Charges:  $Gait Training: 8-22 mins                     Arlyce Dice, DPT Acute Rehabilitation  Services Pager: [phone] Office: [place] 12/21/2020, 3:01 PM

## 2020-12-21 NOTE — Care Management Important Message (Signed)
Medicare IM given to the patient by Berneice Zettlemoyer. 

## 2020-12-21 NOTE — Progress Notes (Signed)
Subjective/Chief Complaint: Patient has had his NG clamped for more than 48 hours Passing flatus No longer distended   Objective: Vital signs in last 24 hours: Temp:  [97.4 F (36.3 C)-99.7 F (37.6 C)] 97.4 F (36.3 C) (05/30 0629) Pulse Rate:  [59-90] 59 (05/30 0629) Resp:  [17-20] 18 (05/30 0629) BP: (111-128)/(70-76) 128/76 (05/30 0629) SpO2:  [94 %-97 %] 94 % (05/30 0629) Last BM Date: 12/13/20  Intake/Output from previous day: No intake/output data recorded. Intake/Output this shift: No intake/output data recorded.   Gen: Alert, NAD Pulm: rate and effort normal RSW:NIOE,[id number], nontender,ventral hernia reducible  Lab Results:  No results for input(s): WBC, HGB, HCT, PLT in the last 72 hours. BMET Recent Labs    12/19/20 0424 12/21/20 0519  NA 141 139  K 4.2 3.7  CL 102 105  CO2 22 27  GLUCOSE 85 114*  BUN 40* 37*  CREATININE 0.82 0.76  CALCIUM 9.4 9.4   PT/INR No results for input(s): LABPROT, INR in the last 72 hours. ABG No results for input(s): PHART, HCO3 in the last 72 hours.  Invalid input(s): PCO2, PO2  Studies/Results: DG Abd Portable 1V-Small Bowel Obstruction Protocol-24 hr delay  Result Date: 12/19/2020 CLINICAL DATA:  24 hour delayed image for a small bowel obstruction. EXAM: PORTABLE ABDOMEN - 1 VIEW COMPARISON:  Dec 18, 2020 FINDINGS: Oral contrast opacifies parts of the colon and distal small bowel. Enteric catheter overlies expected location of gastric body. Stable postsurgical changes in the abdomen. IMPRESSION: Oral contrast opacifies parts of the colon and distal small bowel, advanced from the prior radiograph dated Dec 18, 2020 at 9:07 p.m. Electronically Signed   By: Fidela Salisbury M.D.   On: 12/19/2020 14:16    Anti-infectives: Anti-infectives (From admission, onward)   Start     Dose/Rate Route Frequency Ordered Stop   12/12/20 0715  cefTRIAXone (ROCEPHIN) 1 g in sodium chloride 0.9 % 100 mL IVPB        1  g 200 mL/hr over 30 Minutes Intravenous  Once 12/12/20 0706 12/12/20 0841      Assessment/Plan: Subjective/Chief Complaint: Patient has been clamped for about a day.  No nausea or vomiting Reports no flatus or BM that he can remember Tolerating clears Repeat films show contrast progressing in the colon   Objective: Vital signs in last 24 hours: Temp:  [97.6 F (36.4 C)-98.2 F (36.8 C)] 97.6 F (36.4 C) (05/29 0447) Pulse Rate:  [69-82] 69 (05/29 0447) Resp:  [16-18] 16 (05/29 0447) BP: (118-130)/(80-86) 130/86 (05/29 0447) SpO2:  [95 %-97 %] 96 % (05/29 0447) Last BM Date: 12/13/20  Intake/Output from previous day: 05/28 0701 - 05/29 0700 In: -  Out: 825 [Urine:825] Intake/Output this shift: No intake/output data recorded.  Gen: Alert, NAD Pulm: rate and effort normal XHB:ZJIR,[id number], nontender,ventral hernia reducible  Lab Results:  Recent Labs (last 2 labs)   No results for input(s): WBC, HGB, HCT, PLT in the last 72 hours.   BMET Recent Labs (last 2 labs)       Recent Labs    12/18/20 0517 12/19/20 0424  NA 137 141  K 3.9 4.2  CL 103 102  CO2 23 22  GLUCOSE 65* 85  BUN 30* 40*  CREATININE 0.73 0.82  CALCIUM 9.2 9.4     PT/INR Recent Labs (last 2 labs)   No results for input(s): LABPROT, INR in the last 72 hours.   ABG  Recent Labs (last 2 labs)   No  results for input(s): PHART, HCO3 in the last 72 hours.  Invalid input(s): PCO2, PO2    Studies/Results:  Imaging Results (Last 48 hours)  DG Abd Portable 1V-Small Bowel Obstruction Protocol-24 hr delay  Result Date: 12/19/2020 CLINICAL DATA:  24 hour delayed image for a small bowel obstruction. EXAM: PORTABLE ABDOMEN - 1 VIEW COMPARISON:  Dec 18, 2020 FINDINGS: Oral contrast opacifies parts of the colon and distal small bowel. Enteric catheter overlies expected location of gastric body. Stable postsurgical changes in the abdomen. IMPRESSION: Oral contrast opacifies parts of  the colon and distal small bowel, advanced from the prior radiograph dated Dec 18, 2020 at 9:07 p.m. Electronically Signed   By: Fidela Salisbury M.D.   On: 12/19/2020 14:16   DG Abd Portable 1V  Result Date: 12/18/2020 CLINICAL DATA:  Small-bowel obstruction 8 hour delay EXAM: PORTABLE ABDOMEN - 1 VIEW COMPARISON:  12/18/2020, CT 12/12/2020 FINDINGS: Contrast is present within nondilated small bowel as well as the right colon. Esophageal tube tip overlies the distal stomach. Multiple surgical clips. IMPRESSION: Contrast has reached the right colon. Contrast is present within multiple nondilated loops of small bowel. Electronically Signed   By: Donavan Foil M.D.   On: 12/18/2020 21:34   DG Abd Portable 1V-Small Bowel Obstruction Protocol-initial, 8 hr delay  Result Date: 12/18/2020 CLINICAL DATA:  Small-bowel obstruction, diffuse abdominal pain EXAM: PORTABLE ABDOMEN - 1 VIEW COMPARISON:  Portable exam 0842 hours compared to 01/01/2016 FINDINGS: Tip of nasogastric tube projects over gastric antrum. Nonspecific bowel gas pattern. Single air-filled nonspecific loop of small bowel in LEFT mid abdomen. No bowel wall thickening. Stool present in rectum. Bones demineralized with degenerative changes of the lumbar spine with scoliosis. Orthopedic hardware at the proximal femora bilaterally. IMPRESSION: Single prominent loop of small bowel in the LEFT mid abdomen without definite evidence of obstruction. Electronically Signed   By: Lavonia Dana M.D.   On: 12/18/2020 10:53     Anti-infectives:            Anti-infectives (From admission, onward)     Start     Dose/Rate Route Frequency Ordered Stop   12/12/20 0715  cefTRIAXone (ROCEPHIN) 1 g in sodium chloride 0.9 % 100 mL IVPB        1 g 200 mL/hr over 30 Minutes Intravenous  Once 12/12/20 0706 12/12/20 0841        Assessment/Plan: CAD s/p CABG Bilateral internal iliac artery aneurysms  Prior AAA repair Ventral hernia -  reducible HTN HLD RBBB Prostate cancer s/p XRT Code status DNR/DNI  Recurrent SBO -difficult NG tube placement -NG placed 5/24 by IR - Patient would bevery high riskwith anyoperative intervention, hopefully we can avoid this.Palliative team followingand we appreciate their assistance. If his obstruction does not resolve with conservative measures he is not sure that he would want to undergo any surgery -SBO protocol - contrast further in colon; clamped NG tube x 48 hours; advance to full liquids; encourage ambulation.     LOS: 9 days    Maia Petties 12/21/2020

## 2020-12-21 NOTE — Progress Notes (Signed)
Palliative Medicine Progress Note:   HPI: 87 year old male with past medical history of hypertension, hyperlipidemia,RBBB,CAD s/p CABG, AAA s/p repair and s/p bilateral femoral aneurysm repair in '95, thoracic aneurysm, GI bleed,prostate cancer s/p XRT, bladder tumor s/p resection,distant repair of bowel obstruction, hernia repair with mesh who presented to the ED from home with lower abdominal pain and nausea and was found to have early or partial SBO with transition point along the ventral abdominal wall likely due to adhesions in the setting of prior abdominal surgery.   Subjective:  Medical records reviewed. Assessed patient at the bedside. He is resting and drowsy, but easily aroused. He reports sciatic pain and expresses feeling concerned and is wondering he will be able to return home. He would like to return to [place] and he speaks highly of the staff. Provided encouragement, updates, and education on important milestones that he will need to reach before safe discharge. He verbalizes understanding and agrees with my plan to call his son [name] and provide updates and support. Questions and concerns addressed.  Attempted to call [name] and was unable to reach. Able to leave a voicemail with contact information. PMT will continue to support holistically.   Physical Exam: elderly, appears mildly uncomfortable while sitting in bedside chair. NG tube secured in place. Alert and oriented x3. Normal respiratory effort. Moves all extremities spontaneously. Abdomen is non-distended.   Assessment:  SBO, recurrent, gradually improving  Plan:  -Continue current interventions per surgery team -Will decrease dose of IV Robinul by 50% due to dry mouth -Emotional support and therapeutic listening provided -Ongoing support from PMT  Total time: 15 minutes  Greater than 50% of this time was spent in counseling and coordinating care related to the above assessment and plan.  Dorthy Cooler,  PA-C Palliative Medicine Team Team phone # [phone]  Thank you for allowing the Palliative Medicine Team to assist in the care of this patient. Please utilize secure chat with additional questions, if there is no response within 30 minutes please call the above phone number.  Palliative Medicine Team providers are available by phone from 7am to 7pm daily and can be reached through the team cell phone.  Should this patient require assistance outside of these hours, please call the patient's attending physician.

## 2020-12-22 MED ORDER — BISACODYL 10 MG RE SUPP
10.0000 mg | Freq: Once | RECTAL | Status: AC
  Administered 2020-12-22: 10 mg via RECTAL
  Filled 2020-12-22: qty 1

## 2020-12-22 MED ORDER — KETOROLAC TROMETHAMINE 15 MG/ML IJ SOLN
15.0000 mg | Freq: Four times a day (QID) | INTRAMUSCULAR | Status: DC | PRN
  Administered 2020-12-22 – 2020-12-25 (×9): 15 mg via INTRAVENOUS
  Filled 2020-12-22 (×11): qty 1

## 2020-12-22 NOTE — Plan of Care (Signed)
  Problem: Education: Goal: Knowledge of General Education information will improve Description: Including pain rating scale, medication(s)/side effects and non-pharmacologic comfort measures Outcome: Progressing   Problem: Health Behavior/Discharge Planning: Goal: Ability to manage health-related needs will improve Outcome: Progressing   Problem: Clinical Measurements: Goal: Will remain free from infection Outcome: Progressing Goal: Cardiovascular complication will be avoided Outcome: Progressing   Problem: Coping: Goal: Level of anxiety will decrease Outcome: Progressing   Problem: Safety: Goal: Ability to remain free from injury will improve Outcome: Progressing

## 2020-12-22 NOTE — Progress Notes (Signed)
Subjective/Chief Complaint: Patient has had his NG clamped for more than 48 hours Passing flatus, no BM's yet No longer distended   Objective: Vital signs in last 24 hours: Temp:  [97.5 F (36.4 C)-97.6 F (36.4 C)] 97.6 F (36.4 C) (05/31 0513) Pulse Rate:  [63-71] 63 (05/31 0513) Resp:  [18-20] 20 (05/31 0513) BP: (105-128)/(73-87) 128/73 (05/31 0513) SpO2:  [95 %-98 %] 95 % (05/31 0513) Last BM Date: 12/13/20  Intake/Output from previous day: 05/30 0701 - 05/31 0700 In: 480 [P.O.:480] Out: 950 [Urine:950] Intake/Output this shift: No intake/output data recorded.   Gen: Alert, NAD Pulm: rate and effort normal AYT:KZSW,FUXNATFTDDUK, nontender,ventral hernia reducible  Lab Results:  No results for input(s): WBC, HGB, HCT, PLT in the last 72 hours. BMET Recent Labs    12/21/20 0519  NA 139  K 3.7  CL 105  CO2 27  GLUCOSE 114*  BUN 37*  CREATININE 0.76  CALCIUM 9.4   PT/INR No results for input(s): LABPROT, INR in the last 72 hours. ABG No results for input(s): PHART, HCO3 in the last 72 hours.  Invalid input(s): PCO2, PO2  Studies/Results: DG Abd Portable 2V  Result Date: 12/21/2020 CLINICAL DATA:  Check gastric catheter placement EXAM: PORTABLE ABDOMEN - 2 VIEW COMPARISON:  Film from 12/19/2020 FINDINGS: Gastric catheter is noted coiled within the stomach. Contrast material is noted throughout the colon. IMPRESSION: Gastric catheter within the stomach. Electronically Signed   By: Inez Catalina M.D.   On: 12/21/2020 15:04    Anti-infectives: Anti-infectives (From admission, onward)   Start     Dose/Rate Route Frequency Ordered Stop   12/12/20 0715  cefTRIAXone (ROCEPHIN) 1 g in sodium chloride 0.9 % 100 mL IVPB        1 g 200 mL/hr over 30 Minutes Intravenous  Once 12/12/20 0706 12/12/20 0841      Assessment/Plan: Subjective/Chief Complaint: Patient has been clamped for about a day.  No nausea or vomiting Reports no flatus or BM that he  can remember Tolerating fulls Repeat films show contrast progressing in the colon   Objective: Vital signs in last 24 hours: Temp:  [97.6 F (36.4 C)-98.2 F (36.8 C)] 97.6 F (36.4 C) (05/29 0447) Pulse Rate:  [69-82] 69 (05/29 0447) Resp:  [16-18] 16 (05/29 0447) BP: (118-130)/(80-86) 130/86 (05/29 0447) SpO2:  [95 %-97 %] 96 % (05/29 0447) Last BM Date: 12/13/20  Intake/Output from previous day: 05/28 0701 - 05/29 0700 In: -  Out: 825 [Urine:825] Intake/Output this shift: No intake/output data recorded.  Gen: Alert, NAD Pulm: rate and effort normal GUR:KYHC,WCBJSEGBTDVV, nontender,ventral hernia reducible  Lab Results:  Recent Labs (last 2 labs)   No results for input(s): WBC, HGB, HCT, PLT in the last 72 hours.   BMET Recent Labs (last 2 labs)       Recent Labs    12/18/20 0517 12/19/20 0424  NA 137 141  K 3.9 4.2  CL 103 102  CO2 23 22  GLUCOSE 65* 85  BUN 30* 40*  CREATININE 0.73 0.82  CALCIUM 9.2 9.4     PT/INR Recent Labs (last 2 labs)   No results for input(s): LABPROT, INR in the last 72 hours.   ABG  Recent Labs (last 2 labs)   No results for input(s): PHART, HCO3 in the last 72 hours.  Invalid input(s): PCO2, PO2    Studies/Results:  Imaging Results (Last 48 hours)  DG Abd Portable 1V-Small Bowel Obstruction Protocol-24 hr delay  Result Date:  12/19/2020 CLINICAL DATA:  24 hour delayed image for a small bowel obstruction. EXAM: PORTABLE ABDOMEN - 1 VIEW COMPARISON:  Dec 18, 2020 FINDINGS: Oral contrast opacifies parts of the colon and distal small bowel. Enteric catheter overlies expected location of gastric body. Stable postsurgical changes in the abdomen. IMPRESSION: Oral contrast opacifies parts of the colon and distal small bowel, advanced from the prior radiograph dated Dec 18, 2020 at 9:07 p.m. Electronically Signed   By: Fidela Salisbury M.D.   On: 12/19/2020 14:16   DG Abd Portable 1V  Result Date:  12/18/2020 CLINICAL DATA:  Small-bowel obstruction 8 hour delay EXAM: PORTABLE ABDOMEN - 1 VIEW COMPARISON:  12/18/2020, CT 12/12/2020 FINDINGS: Contrast is present within nondilated small bowel as well as the right colon. Esophageal tube tip overlies the distal stomach. Multiple surgical clips. IMPRESSION: Contrast has reached the right colon. Contrast is present within multiple nondilated loops of small bowel. Electronically Signed   By: Donavan Foil M.D.   On: 12/18/2020 21:34   DG Abd Portable 1V-Small Bowel Obstruction Protocol-initial, 8 hr delay  Result Date: 12/18/2020 CLINICAL DATA:  Small-bowel obstruction, diffuse abdominal pain EXAM: PORTABLE ABDOMEN - 1 VIEW COMPARISON:  Portable exam 0842 hours compared to 01/01/2016 FINDINGS: Tip of nasogastric tube projects over gastric antrum. Nonspecific bowel gas pattern. Single air-filled nonspecific loop of small bowel in LEFT mid abdomen. No bowel wall thickening. Stool present in rectum. Bones demineralized with degenerative changes of the lumbar spine with scoliosis. Orthopedic hardware at the proximal femora bilaterally. IMPRESSION: Single prominent loop of small bowel in the LEFT mid abdomen without definite evidence of obstruction. Electronically Signed   By: Lavonia Dana M.D.   On: 12/18/2020 10:53     Anti-infectives:            Anti-infectives (From admission, onward)     Start     Dose/Rate Route Frequency Ordered Stop   12/12/20 0715  cefTRIAXone (ROCEPHIN) 1 g in sodium chloride 0.9 % 100 mL IVPB        1 g 200 mL/hr over 30 Minutes Intravenous  Once 12/12/20 0706 12/12/20 0841        Assessment/Plan: CAD s/p CABG Bilateral internal iliac artery aneurysms  Prior AAA repair Ventral hernia - reducible HTN HLD RBBB Prostate cancer s/p XRT Code status DNR/DNI  Recurrent SBO -difficult NG tube placement -NG placed 5/24 by IR - Patient would bevery high riskwith anyoperative intervention, hopefully we can  avoid this.Palliative team followingand we appreciate their assistance. If his obstruction does not resolve with conservative measures he is not sure that he would want to undergo any surgery -SBO protocol - contrast further in colon; clamped NG tube x 48 hours; on full liquids; encourage ambulation.   - will try a suppository today.     LOS: 10 days    Rosario Adie [id number]

## 2020-12-22 NOTE — Progress Notes (Signed)
Physical Therapy Treatment Patient Details Name: Jesse [name] MRN: [id number] DOB: [birth year] Today's Date: 12/22/2020    History of Present Illness Patient admitted with SBO. Multiple attempts failed for NG tube placement and subsequently it was placed by IR on 12/15/2020.  This is a 87 year old male with past medical history of hypertension, hyperlipidemia, RBBB, CAD s/p CABG, AAA s/p repair and s/p bilateral femoral aneurysm repair in '95, thoracic aneurysm, GI bleed, prostate cancer s/p XRT, bladder tumor s/p resection, distant repair of bowel obstruction, hernia repair with mesh who presented to the ED from home with lower abdominal pain, nausea and dry heaves    PT Comments    Pt continues to participate fairly well. He reports fatigue and some dyspnea on today. He was agreeable to ambulate. Assisted pt into recliner after walk. Will continue to follow. O2 91% on RA.    Follow Up Recommendations  Home health PT     Equipment Recommendations  None recommended by PT    Recommendations for Other Services       Precautions / Restrictions Precautions Precautions: Fall Precaution Comments: reports history of falls, NG tube clamped Restrictions Weight Bearing Restrictions: No    Mobility  Bed Mobility Overal bed mobility: Needs Assistance Bed Mobility: Supine to Sit     Supine to sit: Min guard;HOB elevated     General bed mobility comments: Increased time.Supv for safety.    Transfers Overall transfer level: Needs assistance Equipment used: Rolling walker (2 wheeled) Transfers: Sit to/from Stand Sit to Stand: Min guard         General transfer comment: cues for hand placement  Ambulation/Gait Ambulation/Gait assistance: Min guard Gait Distance (Feet): 100 Feet Assistive device: Rolling walker (2 wheeled) Gait Pattern/deviations: Step-through pattern;Decreased stride length     General Gait Details: verbal cues for posture and distance from RW, pt reports  sciatica pain not bothering him as much   Chief Strategy Officer    Modified Rankin (Stroke Patients Only)       Balance Overall balance assessment: Mild deficits observed, not formally tested                                          Cognition Arousal/Alertness: Awake/alert Behavior During Therapy: WFL for tasks assessed/performed Overall Cognitive Status: Within Functional Limits for tasks assessed                                        Exercises      General Comments        Pertinent Vitals/Pain Pain Assessment: Faces Faces Pain Scale: Hurts a little bit Pain Location: right buttock ("sciatica") Pain Descriptors / Indicators: Discomfort Pain Intervention(s): Monitored during session;Repositioned    Home Living                      Prior Function            PT Goals (current goals can now be found in the care plan section) Progress towards PT goals: Progressing toward goals    Frequency    Min 3X/week      PT Plan Current plan remains appropriate    Co-evaluation  AM-PAC PT "6 Clicks" Mobility   Outcome Measure  Help needed turning from your back to your side while in a flat bed without using bedrails?: A Little Help needed moving from lying on your back to sitting on the side of a flat bed without using bedrails?: A Little Help needed moving to and from a bed to a chair (including a wheelchair)?: A Little Help needed standing up from a chair using your arms (e.g., wheelchair or bedside chair)?: A Little Help needed to walk in hospital room?: A Little Help needed climbing 3-5 steps with a railing? : A Little 6 Click Score: 18    End of Session Equipment Utilized During Treatment: Gait belt Activity Tolerance: Patient tolerated treatment well Patient left: in chair;with call bell/phone within reach;with chair alarm set   PT Visit Diagnosis: Muscle weakness  (generalized) (M62.81);Difficulty in walking, not elsewhere classified (R26.2)     Time: 1314-3888 PT Time Calculation (min) (ACUTE ONLY): 21 min  Charges:  $Gait Training: 8-22 mins                         Doreatha Massed, PT Acute Rehabilitation  Office: [phone] Pager: [phone]

## 2020-12-22 NOTE — Progress Notes (Signed)
PROGRESS NOTE    Jesse [name]  GLO:[id number] DOB: 06-24-1928 DOA: 12/12/2020 PCP: [name], MD   Brief Narrative:   87 year old male with past medical history of hypertension, hyperlipidemia,RBBB,CAD s/p CABG, AAA s/p repair and s/p bilateral femoral aneurysm repair in '95, thoracic aneurysm, GI bleed,prostate cancer s/p XRT, bladder tumor s/p resection,distant repair of bowel obstruction, hernia repair with mesh who presented to the ED from home with lower abdominal pain and nausea and was Found to have early or partial SBO with transition point along the ventral abdominal wall likely due to adhesions in the setting of prior abdominal surgery with no free air or free fluid, chronic massive bilateral internal iliac artery aneurysms but enlarged since 2019 with subsequent increased mass-effect on the sigmoid colon as it traverses the mid pelvis but no large bowel obstruction, otherwise chronic issues based on the CT abdomen and pelvis.  Patient admitted to hospitalist service with general surgery consulted.  Multiple attempts failed for NG tube placement and subsequently it was placed by IR on 12/15/2020.  Clinically improving.  NG tube has been clamped for 2 days.  Diet is advanced.  Management surgery.   Assessment & Plan:   Principal Problem:   SBO (small bowel obstruction) (HCC) Active Problems:   HLD (hyperlipidemia)   Essential hypertension   CAD, ARTERY BYPASS GRAFT   Iliac artery aneurysm, bilateral (HCC)   Aneurysm of abdominal vessel (HCC)   Goals of care, counseling/discussion   Palliative care by specialist    Small bowel obstruction secondary to adhesions: Passing flatus, no abdominal pain.  Tolerating full liquid diet.  No nausea.  Abdomen much less distended today.  Diet advanced to full liquid diet.  Will defer to surgery about the timing of pulling the NG tube out.  Management per surgery.  Abnormal UA /pyuria -patient has no urinary symptoms.  Has history of  bladder tumor resection which could explain the pyuria due to inflammation.  Defer antibiotics and monitor clinically.  Chronic massive bilateral internal iliac artery aneurysms with extensive vascular history -admitting hospitalist discussed with vascular surgeon Dr. Scot Dock.  No acute issues and no intervention or evaluation warranted this admission. Close outpatient follow-up  Hypokalemia: Resolved.  CAD status post CABG /hyperlipidemia/hypertension-stable --Holding aspirin, Coreg, statin, fenofibrate due to n.p.o. status  Patient BMI: Body mass index is 23.73 kg/m.   DVT prophylaxis: heparin injection 5,000 Units Start: 12/12/20 0815   Code Status: DNR  Family Communication: None present at bedside today.  Son [name] was present at bedside yesterday.  Status is: Inpatient  Remains inpatient appropriate because:Inpatient level of care appropriate due to severity of illness   Dispo: The patient is from: Home              Anticipated d/c is to: Home              Patient currently is not medically stable to d/c.   Difficult to place patient No        Estimated body mass index is 23.73 kg/m as calculated from the following:   Height as of this encounter: 6' (1.829 m).   Weight as of this encounter: 79.4 kg.      Nutritional status:               Consultants:   General surgery  Palliative care  Procedures:   None  Antimicrobials:  Anti-infectives (From admission, onward)   Start     Dose/Rate Route Frequency Ordered Stop  12/12/20 0715  cefTRIAXone (ROCEPHIN) 1 g in sodium chloride 0.9 % 100 mL IVPB        1 g 200 mL/hr over 30 Minutes Intravenous  Once 12/12/20 0706 12/12/20 0841         Subjective: Seen and examined.  Feels better.  No abdominal pain, no nausea, tolerating clear liquid diet and passing flatus but no BM.  Objective: Vitals:   12/21/20 0629 12/21/20 1216 12/21/20 2126 12/22/20 0513  BP: 128/76 117/87 105/82 128/73   Pulse: (!) 59 71 70 63  Resp: 18 18 18 20   Temp: (!) 97.4 F (36.3 C) (!) 97.5 F (36.4 C) (!) 97.5 F (36.4 C) 97.6 F (36.4 C)  TempSrc:   Oral Oral  SpO2: 94% 98% 95% 95%  Weight:      Height:        Intake/Output Summary (Last 24 hours) at 12/22/2020 1012 Last data filed at 12/21/2020 1800 Gross per 24 hour  Intake 360 ml  Output 950 ml  Net -590 ml   Filed Weights   12/12/20 0323  Weight: 79.4 kg    Examination: General exam: Appears calm and comfortable  Respiratory system: Clear to auscultation. Respiratory effort normal. Cardiovascular system: S1 & S2 heard, RRR. No JVD, murmurs, rubs, gallops or clicks. No pedal edema. Gastrointestinal system: Abdomen is very slightly distended, soft and nontender. No organomegaly or masses felt. Normal bowel sounds heard. Central nervous system: Alert and oriented. No focal neurological deficits. Extremities: Symmetric 5 x 5 power. Skin: No rashes, lesions or ulcers.  Psychiatry: Judgement and insight appear normal. Mood & affect appropriate.   Data Reviewed: I have personally reviewed following labs and imaging studies  CBC: Recent Labs  Lab 12/16/20 0445 12/17/20 0444  WBC 4.6 4.1  HGB 11.2* 11.2*  HCT 35.1* 35.1*  MCV 93.9 93.4  PLT 218 161   Basic Metabolic Panel: Recent Labs  Lab 12/16/20 0445 12/17/20 0444 12/18/20 0517 12/19/20 0424 12/21/20 0519  NA 137 137 137 141 139  K 3.4* 3.8 3.9 4.2 3.7  CL 103 104 103 102 105  CO2 27 22 23 22 27   GLUCOSE 92 78 65* 85 114*  BUN 41* 33* 30* 40* 37*  CREATININE 0.69 0.60* 0.73 0.82 0.76  CALCIUM 8.7* 8.7* 9.2 9.4 9.4  MG  --  2.2  --   --   --    GFR: Estimated Creatinine Clearance: 67.4 mL/min (by C-G formula based on SCr of 0.76 mg/dL). Liver Function Tests: No results for input(s): AST, ALT, ALKPHOS, BILITOT, PROT, ALBUMIN in the last 168 hours. No results for input(s): LIPASE, AMYLASE in the last 168 hours. No results for input(s): AMMONIA in the last  168 hours. Coagulation Profile: No results for input(s): INR, PROTIME in the last 168 hours. Cardiac Enzymes: No results for input(s): CKTOTAL, CKMB, CKMBINDEX, TROPONINI in the last 168 hours. BNP (last 3 results) No results for input(s): PROBNP in the last 8760 hours. HbA1C: No results for input(s): HGBA1C in the last 72 hours. CBG: No results for input(s): GLUCAP in the last 168 hours. Lipid Profile: No results for input(s): CHOL, HDL, LDLCALC, TRIG, CHOLHDL, LDLDIRECT in the last 72 hours. Thyroid Function Tests: No results for input(s): TSH, T4TOTAL, FREET4, T3FREE, THYROIDAB in the last 72 hours. Anemia Panel: No results for input(s): VITAMINB12, FOLATE, FERRITIN, TIBC, IRON, RETICCTPCT in the last 72 hours. Sepsis Labs: No results for input(s): PROCALCITON, LATICACIDVEN in the last 168 hours.  No results found  for this or any previous visit (from the past 240 hour(s)).    Radiology Studies: DG Abd Portable 2V  Result Date: 12/21/2020 CLINICAL DATA:  Check gastric catheter placement EXAM: PORTABLE ABDOMEN - 2 VIEW COMPARISON:  Film from 12/19/2020 FINDINGS: Gastric catheter is noted coiled within the stomach. Contrast material is noted throughout the colon. IMPRESSION: Gastric catheter within the stomach. Electronically Signed   By: Inez Catalina M.D.   On: 12/21/2020 15:04    Scheduled Meds: . bisacodyl  10 mg Rectal Once  . chlorhexidine  15 mL Mouth Rinse BID  . diatrizoate meglumine-sodium  90 mL Per NG tube Once  . heparin  5,000 Units Subcutaneous Q8H  . hydrALAZINE  5 mg Intravenous Once  . lidocaine  1 patch Transdermal Q24H  . lip balm  1 application Topical BID  . sodium chloride flush  3 mL Intravenous Q12H   Continuous Infusions: . chlorproMAZINE (THORAZINE) IV Stopped (12/17/20 1006)  . methocarbamol (ROBAXIN) IV 1,000 mg (12/20/20 1305)  . promethazine (PHENERGAN) injection (IM or IVPB)       LOS: 10 days   Time spent: 27 minutes   Darliss Cheney,  MD [facility]  12/22/2020, 10:12 AM   How to contact the [facility] Attending or Consulting provider [place] or covering provider during after hours [place], for this patient?  1. Check the care team in [facility] and look for a) attending/consulting [facility] provider listed and b) the [facility] team listed. Page or secure chat [phone]. 2. Log into [email or web address] and use [place] universal password to access. If you do not have the password, please contact the hospital operator. 3. Locate the [facility] provider you are looking for under [facility] and page to a number that you can be directly reached. 4. If you still have difficulty reaching the provider, please page the [facility] (Director on Call) for the Hospitalists listed on amion for assistance.

## 2020-12-22 NOTE — Progress Notes (Signed)
Palliative Medicine Progress Note  HPI: 86 year old male with past medical history of hypertension, hyperlipidemia,RBBB,CAD s/p CABG, AAA s/p repair and s/p bilateral femoral aneurysm repair in '95, thoracic aneurysm, GI bleed,prostate cancer s/p XRT, bladder tumor s/p resection,distant repair of bowel obstruction, hernia repair with mesh who presented to the ED from home with lower abdominal pain and nausea and wasfound to have early or partial SBO with transition point along the ventral abdominal wall likely due to adhesions in the setting of prior abdominal surgery.    Subjective: Medical records reviewed. Assessed patient at the bedside. He is more alert today and looks forward to removal of NG tube soon. He acknowledges his drowsiness during our conversation yesterday and feels that the progression of his diet has been helpful for his energy level. He gladly shares the foods he was able to eat. Therapeutic listening and emotional support was provided as Jaydyn reflects on the abdominal surgeries and other medical conditions that he has experienced throughout his life. He feels that he has lived a long, fulfilling life and his [employer] faith continues to be a source of strength and encouragement. He is appreciative of the care he has received and he hopes to enjoy some time at home.  Kervin continues to report sciatic pain and mouth dryness. Educated on the risks and benefits of Robinul and discussed the option to discontinue, given he is no longer affected by excessive secretions. He agrees to this plan. Nico also shares that he is feeling nervous about NG removal, given difficulty of placement. Questions and concerns addressed, encouragement provided.    Physical Exam: vital signs reviewed. Pleasant elderly male, alert and oriented x3. Abdomen is mildly distended, non-tender. NG tube secured in place. Breathing comfortably on room air. Regular heart rate. Moves all extremities without difficulty.    Assessment: SBO, improving  Plan:  -Discontinue IV Robinul -Continue current interventions, will have NG tube removed today -Psychosocial and emotional support provided -PMT will continue to support holistically  Total time: 20 minutes Greater than 50% of this time was spent in counseling and coordinating care related to the above assessment and plan.  Dorthy Cooler, PA-C Palliative Medicine Team Team phone # [phone]  Thank you for allowing the Palliative Medicine Team to assist in the care of this patient. Please utilize secure chat with additional questions, if there is no response within 30 minutes please call the above phone number.  Palliative Medicine Team providers are available by phone from 7am to 7pm daily and can be reached through the team cell phone.  Should this patient require assistance outside of these hours, please call the patient's attending physician.

## 2020-12-23 MED ORDER — TRAMADOL HCL 50 MG PO TABS
50.0000 mg | ORAL_TABLET | Freq: Four times a day (QID) | ORAL | Status: DC | PRN
  Administered 2020-12-23 – 2020-12-25 (×2): 50 mg via ORAL
  Filled 2020-12-23 (×2): qty 1

## 2020-12-23 MED ORDER — METHOCARBAMOL 500 MG PO TABS: 500.0000 mg | ORAL_TABLET | Freq: Four times a day (QID) | ORAL | Status: DC | PRN

## 2020-12-23 MED ORDER — HYDROMORPHONE HCL 1 MG/ML IJ SOLN
0.5000 mg | INTRAMUSCULAR | Status: DC | PRN
  Administered 2020-12-24: 0.5 mg via INTRAVENOUS
  Filled 2020-12-23: qty 0.5

## 2020-12-23 MED ORDER — ACETAMINOPHEN 325 MG PO TABS
650.0000 mg | ORAL_TABLET | Freq: Four times a day (QID) | ORAL | Status: DC | PRN
  Administered 2020-12-24 – 2020-12-25 (×3): 650 mg via ORAL
  Filled 2020-12-23 (×3): qty 2

## 2020-12-23 MED ORDER — POLYETHYLENE GLYCOL 3350 17 G PO PACK
17.0000 g | PACK | Freq: Every day | ORAL | Status: DC | PRN
  Administered 2020-12-24: 17 g via ORAL

## 2020-12-23 MED ORDER — BOOST / RESOURCE BREEZE PO LIQD CUSTOM
1.0000 | Freq: Three times a day (TID) | ORAL | Status: DC
  Administered 2020-12-23 – 2020-12-25 (×7): 1 via ORAL

## 2020-12-23 NOTE — Progress Notes (Signed)
[facility] Progress Note  Patient: Jesse [name]    UYQ:[id number]  DOA: 12/12/2020     Date of Service: the patient was seen and examined on 12/23/2020  Brief hospital course: Past medical history of HTN, HLD, RBBB, CAD SP CABG, AAA SP repair, GI bleed, prostate cancer SP XRT, bladder tumor SP resection, hernia repair, SBO repair.  Patient presents with complaints of abdominal pain and nausea and found to have small bowel obstruction.  General surgery was consulted. Conservative measures initiated with NG tube insertion. Currently plan is continue current plan.  Assessment and Plan: 1.  Small bowel obstruction Secondary to adhesion. Passing gas. No abdominal pain. Tolerating liquid diet. NG tube removed by surgery on 6/1. Had difficult placement for NG tube in the past due to hiatal hernia. Recommendation per surgery.  Appreciate their assistance.  2.  Asymptomatic bacteriuria Currently no symptoms. UA had some pyuria. Monitor for now.  3.  Peripheral vascular disease Initially discussed with vascular surgery due to history of chronic appearing bilateral iliac artery aneurysm no intervention recommended.  Patient remains asymptomatic.  4.  CAD SP CABG HLD HTN Holding aspirin and Coreg and statin and Tricor. Will resume when stable.  5.  Hypokalemia Currently being replaced.  6.  Constipation Currently being treated.  Body mass index is 23.73 kg/m.    Interventions:        Diet: Liquid diet DVT Prophylaxis:   heparin injection 5,000 Units Start: 12/12/20 0815    Advance goals of care discussion: DNR  Family Communication: no family was present at bedside, at the time of interview.   Disposition:  Status is: Inpatient  Remains inpatient appropriate because:IV treatments appropriate due to intensity of illness or inability to take PO  Dispo: The patient is from: Home              Anticipated d/c is to: Home              Patient currently is not  medically stable to d/c.   Difficult to place patient No  Subjective: Feeling better.  No nausea no vomiting.  Passing gas.  Had a bowel movement yesterday.  Physical Exam:  General: Appear in mild distress, no Rash; Oral Mucosa Clear, moist. no Abnormal Neck Mass Or lumps, Conjunctiva normal  Cardiovascular: S1 and S2 Present, no Murmur, Respiratory: good respiratory effort, Bilateral Air entry present and CTA, no Crackles, no wheezes Abdomen: Bowel Sound present, Soft and no tenderness Extremities: no Pedal edema Neurology: alert and oriented to time, place, and person affect appropriate. no new focal deficit Gait not checked due to patient safety concerns    Vitals:   12/22/20 1228 12/22/20 2119 12/23/20 0600 12/23/20 1221  BP: 132/79 106/70 115/70 110/67  Pulse: (!) 58 71 68 68  Resp: 19 18 18 16   Temp: 97.9 F (36.6 C) 98.5 F (36.9 C) 98.4 F (36.9 C) 98 F (36.7 C)  TempSrc: Oral Oral Oral Oral  SpO2: 97% 94% 94% 95%  Weight:      Height:        Intake/Output Summary (Last 24 hours) at 12/23/2020 1255 Last data filed at 12/23/2020 0100 Gross per 24 hour  Intake 240 ml  Output 900 ml  Net -660 ml   Filed Weights   12/12/20 0323  Weight: 79.4 kg    Data Reviewed: I have personally reviewed and interpreted daily labs, tele strips, imaging. I reviewed all nursing notes, pharmacy notes, vitals, pertinent old records I  have discussed plan of care as described above with RN and patient/family.  CBC: Recent Labs  Lab 12/17/20 0444  WBC 4.1  HGB 11.2*  HCT 35.1*  MCV 93.4  PLT 155   Basic Metabolic Panel: Recent Labs  Lab 12/17/20 0444 12/18/20 0517 12/19/20 0424 12/21/20 0519  NA 137 137 141 139  K 3.8 3.9 4.2 3.7  CL 104 103 102 105  CO2 22 23 22 27   GLUCOSE 78 65* 85 114*  BUN 33* 30* 40* 37*  CREATININE 0.60* 0.73 0.82 0.76  CALCIUM 8.7* 9.2 9.4 9.4  MG 2.2  --   --   --     Studies: No results found.  Scheduled Meds: . chlorhexidine  15  mL Mouth Rinse BID  . feeding supplement  1 Container Oral TID BM  . heparin  5,000 Units Subcutaneous Q8H  . hydrALAZINE  5 mg Intravenous Once  . lidocaine  1 patch Transdermal Q24H  . lip balm  1 application Topical BID  . sodium chloride flush  3 mL Intravenous Q12H   Continuous Infusions: . chlorproMAZINE (THORAZINE) IV Stopped (12/17/20 1006)  . promethazine (PHENERGAN) injection (IM or IVPB)     PRN Meds: acetaminophen, acetaminophen, alum & mag hydroxide-simeth, chlorproMAZINE (THORAZINE) IV, diazepam, HYDROmorphone (DILAUDID) injection, ketorolac, magic mouthwash, menthol-cetylpyridinium, methocarbamol, metoprolol tartrate, [DISCONTINUED] ondansetron **OR** ondansetron (ZOFRAN) IV, phenol, polyethylene glycol, promethazine (PHENERGAN) injection (IM or IVPB), traMADol  Time spent: 35 minutes  Author: Berle Mull, MD [facility] 12/23/2020 12:55 PM  To reach On-call, see care teams to locate the attending and reach out via [email or web address]. Between 7PM-7AM, please contact night-coverage If you still have difficulty reaching the attending provider, please page the [facility] (Director on Call) for [facility] on amion for assistance.

## 2020-12-23 NOTE — Evaluation (Signed)
Occupational Therapy Evaluation Patient Details Name: Jesse [name] MRN: [id number] DOB: November 10, 1927 Today's Date: 12/23/2020    History of Present Illness Patient admitted with SBO. Multiple attempts failed for NG tube placement and subsequently it was placed by IR on 12/15/2020.  This is a 87 year old male with past medical history of hypertension, hyperlipidemia, RBBB, CAD s/p CABG, AAA s/p repair and s/p bilateral femoral aneurysm repair in '95, thoracic aneurysm, GI bleed, prostate cancer s/p XRT, bladder tumor s/p resection, distant repair of bowel obstruction, hernia repair with mesh who presented to the ED from home with lower abdominal pain, nausea and dry heaves.   Clinical Impression   [name] is a 87 year old man who presents with complaints of right buttocks pain from "sciatica" and overall decreased activity tolerance from prolonged hospitalization. On evaluation patient min guard for limited mobility and ADLs for safety. Patient reports persistent "sciatica" pain limiting his abilities.  Therapist had patient perform 10 back extensions in standing to assist with managing back pain and patient reports improved pain. Therapist educated patient to perform with nursing staff providing min guard or against counter. Patient verbalized understanding. Patient will benefit from skilled OT services while in hospital to improve deficits and learn compensatory strategies as needed in order to return to PLOF.  Don't expect patient will need OT services at discharge.     Follow Up Recommendations  No OT follow up;Supervision - Intermittent    Equipment Recommendations  None recommended by OT    Recommendations for Other Services       Precautions / Restrictions Precautions Precautions: Fall Precaution Comments: reports history of falls Restrictions Weight Bearing Restrictions: No      Mobility Bed Mobility Overal bed mobility: Needs Assistance Bed Mobility: Supine to Sit      Supine to sit: Supervision          Transfers Overall transfer level: Needs assistance Equipment used: Rolling walker (2 wheeled) Transfers: Sit to/from Stand Sit to Stand: Min assist         General transfer comment: Min assist for low bed height for sit to stand otherwise min guard for safety.    Balance Overall balance assessment: Mild deficits observed, not formally tested                                         ADL either performed or assessed with clinical judgement   ADL Overall ADL's : Needs assistance/impaired Eating/Feeding: Independent   Grooming: Min guard;Standing   Upper Body Bathing: Set up   Lower Body Bathing: Set up;Min guard;Sit to/from stand   Upper Body Dressing : Set up   Lower Body Dressing: Min guard;Sit to/from stand   Toilet Transfer: Tour manager Toilet;RW   Toileting- Water quality scientist and Hygiene: Min guard;Sit to/from stand       Functional mobility during ADLs: Min guard;Rolling walker       Vision Baseline Vision/History: Wears glasses Patient Visual Report: No change from baseline       Perception     Praxis      Pertinent Vitals/Pain Pain Assessment: Faces Faces Pain Scale: Hurts whole lot Pain Location: right buttock ("sciatica") Pain Descriptors / Indicators: Grimacing;Discomfort;Sharp Pain Intervention(s): Monitored during session     Hand Dominance Right   Extremity/Trunk Assessment Upper Extremity Assessment Upper Extremity Assessment: Overall WFL for tasks assessed   Lower Extremity Assessment Lower  Extremity Assessment: Defer to PT evaluation       Communication Communication Communication: HOH   Cognition Arousal/Alertness: Awake/alert Behavior During Therapy: WFL for tasks assessed/performed Overall Cognitive Status: Within Functional Limits for tasks assessed                                     General Comments       Exercises Other  Exercises Other Exercises: back extension with 10 second hold x 10   Shoulder Instructions      Home Living Family/patient expects to be discharged to:: Assisted living                             Home Equipment: Walker - 2 wheels;Cane - single point          Prior Functioning/Environment Level of Independence: Independent with assistive device(s)        Comments: Independent with ADLs. has supervision for bathing. Ambulates with cane or walker.        OT Problem List: Decreased activity tolerance;Pain      OT Treatment/Interventions:      OT Goals(Current goals can be found in the care plan section) Acute Rehab OT Goals Patient Stated Goal: get back to independence Time For Goal Achievement: 01/06/21 Potential to Achieve Goals: Good  OT Frequency:     Barriers to D/C:            Co-evaluation              AM-PAC OT "6 Clicks" Daily Activity     Outcome Measure Help from another person eating meals?: None Help from another person taking care of personal grooming?: A Little Help from another person toileting, which includes using toliet, bedpan, or urinal?: A Little Help from another person bathing (including washing, rinsing, drying)?: A Little Help from another person to put on and taking off regular upper body clothing?: A Little Help from another person to put on and taking off regular lower body clothing?: A Little 6 Click Score: 19   End of Session Equipment Utilized During Treatment: Rolling walker Nurse Communication: Mobility status  Activity Tolerance: Patient tolerated treatment well Patient left: in chair;with call bell/phone within reach;with chair alarm set  OT Visit Diagnosis: Pain                Time: 5885-0277 OT Time Calculation (min): 20 min Charges:  OT General Charges $OT Visit: 1 Visit OT Evaluation $OT Eval Low Complexity: 1 Low  Jesse [name], OTR/L [place]  Office [phone] Pager: [phone]    Jesse [name] 12/23/2020, 3:13 PM

## 2020-12-23 NOTE — Progress Notes (Signed)
[facility] Progress Note     Subjective: CC-  Up in chair. No complaints. Denies abdominal pain, nausea, or vomiting. Denies abdominal bloating. Tolerating full liquids. States that he had BM x2 yesterday, and 1 loose stool this morning.  States that he feels weak.  Working with therapies who are recommending home with home health.  Objective: Vital signs in last 24 hours: Temp:  [97.9 F (36.6 C)-98.5 F (36.9 C)] 98.4 F (36.9 C) (06/01 0600) Pulse Rate:  [58-71] 68 (06/01 0600) Resp:  [18-19] 18 (06/01 0600) BP: (106-132)/(70-79) 115/70 (06/01 0600) SpO2:  [94 %-97 %] 94 % (06/01 0600) Last BM Date: 12/13/20  Intake/Output from previous day: 05/31 0701 - 06/01 0700 In: 240 [P.O.:240] Out: 900 [Urine:900] Intake/Output this shift: No intake/output data recorded.  PE: Gen:  Alert, NAD, pleasant Pulm: rate and effort normal on room air Abd: Soft, protuberant, nontender, +BS, soft/reducible ventral hernia Skin: no rashes noted, warm and dry  Lab Results:  No results for input(s): WBC, HGB, HCT, PLT in the last 72 hours. BMET Recent Labs    12/21/20 0519  NA 139  K 3.7  CL 105  CO2 27  GLUCOSE 114*  BUN 37*  CREATININE 0.76  CALCIUM 9.4   PT/INR No results for input(s): LABPROT, INR in the last 72 hours. CMP     Component Value Date/Time   NA 139 12/21/2020 0519   NA 141 03/02/2017 0753   K 3.7 12/21/2020 0519   CL 105 12/21/2020 0519   CO2 27 12/21/2020 0519   GLUCOSE 114 (H) 12/21/2020 0519   GLUCOSE 100 (H) 05/05/2006 0839   BUN 37 (H) 12/21/2020 0519   BUN 26 03/02/2017 0753   CREATININE 0.76 12/21/2020 0519   CREATININE 0.99 03/10/2014 1445   CALCIUM 9.4 12/21/2020 0519   PROT 7.0 12/12/2020 0338   PROT 5.6 (L) 03/02/2017 0753   ALBUMIN 4.1 12/12/2020 0338   ALBUMIN 3.7 03/02/2017 0753   AST 15 12/12/2020 0338   ALT 14 12/12/2020 0338   ALKPHOS 38 12/12/2020 0338   BILITOT 1.1 12/12/2020 0338   BILITOT 0.6 03/02/2017 0753    GFRNONAA >60 12/21/2020 0519   GFRAA >60 08/20/2019 0438   Lipase     Component Value Date/Time   LIPASE 29 12/12/2020 0338       Studies/Results: DG Abd Portable 2V  Result Date: 12/21/2020 CLINICAL DATA:  Check gastric catheter placement EXAM: PORTABLE ABDOMEN - 2 VIEW COMPARISON:  Film from 12/19/2020 FINDINGS: Gastric catheter is noted coiled within the stomach. Contrast material is noted throughout the colon. IMPRESSION: Gastric catheter within the stomach. Electronically Signed   By: Inez Catalina M.D.   On: 12/21/2020 15:04    Anti-infectives: Anti-infectives (From admission, onward)   Start     Dose/Rate Route Frequency Ordered Stop   12/12/20 0715  cefTRIAXone (ROCEPHIN) 1 g in sodium chloride 0.9 % 100 mL IVPB        1 g 200 mL/hr over 30 Minutes Intravenous  Once 12/12/20 0706 12/12/20 0841       Assessment/Plan CAD s/p CABG Bilateral internal iliac artery aneurysms  Prior AAA repair Ventral hernia - reducible HTN HLD RBBB Prostate cancer s/p XRT Code status DNR/DNI  Recurrent SBO - D/c NG tube today and advance to soft diet. Add Boost. Continue mobilizing, PT/OT. If he tolerates diet and continues to have bowel function he may be ready for discharge as early as tomorrow from a surgical standpoint.  ID - rocephin x1 5/21 FEN - soft diet, Boost Foley - none VTE - sq heparin   LOS: 11 days    Wellington Hampshire, [facility] 12/23/2020, 9:35 AM Please see Amion for pager number during day hours 7:00am-4:30pm

## 2020-12-23 NOTE — TOC Progression Note (Signed)
Transition of Care [facility]) - Progression Note    Patient Details  Name: Jesse [name] MRN: [id number] Date of Birth: 1928/07/22  Transition of [facility]) CM/SW Contact  Ross Ludwig, [place] Phone Number: 12/23/2020, 3:48 PM  Clinical Narrative:     CSW attempted to speak to Noralee Space [phone] to discuss patient returning back once he is medically ready for discharge, CSW unable to leave a message.  Will try again at a later time.   Expected Discharge Plan: Assisted Living Barriers to Discharge: Continued Medical Work up  Expected Discharge Plan and Services Expected Discharge Plan: Assisted Living   Discharge Planning Services: CM Consult   Living arrangements for the past 2 months: Assisted Living Facility                                       Social Determinants of Health (SDOH) Interventions    Readmission Risk Interventions No flowsheet data found.

## 2020-12-24 LAB — CBC
HCT: 36.2 % — ABNORMAL LOW (ref 39.0–52.0)
Hemoglobin: 11.8 g/dL — ABNORMAL LOW (ref 13.0–17.0)
MCH: 30 pg (ref 26.0–34.0)
MCHC: 32.6 g/dL (ref 30.0–36.0)
MCV: 92.1 fL (ref 80.0–100.0)
Platelets: 279 10*3/uL (ref 150–400)
RBC: 3.93 MIL/uL — ABNORMAL LOW (ref 4.22–5.81)
RDW: 13.8 % (ref 11.5–15.5)
WBC: 7.1 10*3/uL (ref 4.0–10.5)
nRBC: 0 % (ref 0.0–0.2)

## 2020-12-24 LAB — BASIC METABOLIC PANEL
Anion gap: 9 (ref 5–15)
BUN: 40 mg/dL — ABNORMAL HIGH (ref 8–23)
CO2: 22 mmol/L (ref 22–32)
Calcium: 9 mg/dL (ref 8.9–10.3)
Chloride: 104 mmol/L (ref 98–111)
Creatinine, Ser: 0.63 mg/dL (ref 0.61–1.24)
GFR, Estimated: >60 mL/min (ref >60)
Glucose, Bld: 113 mg/dL — ABNORMAL HIGH (ref 70–99)
Potassium: 3.4 mmol/L — ABNORMAL LOW (ref 3.5–5.1)
Sodium: 135 mmol/L (ref 135–145)

## 2020-12-24 LAB — MAGNESIUM: Magnesium: 2 mg/dL (ref 1.7–2.4)

## 2020-12-24 MED ORDER — SIMVASTATIN 40 MG PO TABS
40.0000 mg | ORAL_TABLET | Freq: Every day | ORAL | Status: DC
  Administered 2020-12-24: 40 mg via ORAL
  Filled 2020-12-24: qty 1

## 2020-12-24 MED ORDER — ASPIRIN EC 81 MG PO TBEC
81.0000 mg | DELAYED_RELEASE_TABLET | Freq: Every day | ORAL | Status: DC
  Administered 2020-12-24 – 2020-12-25 (×2): 81 mg via ORAL
  Filled 2020-12-24 (×2): qty 1

## 2020-12-24 MED ORDER — POLYETHYLENE GLYCOL 3350 17 G PO PACK
17.0000 g | PACK | Freq: Every day | ORAL | Status: DC
  Administered 2020-12-24: 17 g via ORAL
  Filled 2020-12-24 (×2): qty 1

## 2020-12-24 MED ORDER — ASPIRIN 81 MG PO TABS: 81.0000 mg | ORAL_TABLET | Freq: Every day | ORAL | Status: DC

## 2020-12-24 MED ORDER — PANTOPRAZOLE SODIUM 40 MG PO TBEC
40.0000 mg | DELAYED_RELEASE_TABLET | Freq: Every day | ORAL | Status: DC
  Administered 2020-12-24 – 2020-12-25 (×2): 40 mg via ORAL
  Filled 2020-12-24 (×2): qty 1

## 2020-12-24 MED ORDER — FENOFIBRATE 160 MG PO TABS
160.0000 mg | ORAL_TABLET | Freq: Every day | ORAL | Status: DC
  Administered 2020-12-24 – 2020-12-25 (×2): 160 mg via ORAL
  Filled 2020-12-24 (×2): qty 1

## 2020-12-24 MED ORDER — FINASTERIDE 5 MG PO TABS
5.0000 mg | ORAL_TABLET | Freq: Every day | ORAL | Status: DC
  Administered 2020-12-24 – 2020-12-25 (×2): 5 mg via ORAL
  Filled 2020-12-24 (×2): qty 1

## 2020-12-24 MED ORDER — CARVEDILOL 12.5 MG PO TABS: 12.5000 mg | ORAL_TABLET | Freq: Two times a day (BID) | ORAL | Status: DC

## 2020-12-24 MED ORDER — TAMSULOSIN HCL 0.4 MG PO CAPS
0.4000 mg | ORAL_CAPSULE | Freq: Every day | ORAL | Status: DC
  Administered 2020-12-24 – 2020-12-25 (×2): 0.4 mg via ORAL
  Filled 2020-12-24 (×2): qty 1

## 2020-12-24 NOTE — Progress Notes (Addendum)
[facility] Progress Note  Patient: Jesse [name]    NGE:[id number]  DOA: 12/12/2020     Date of Service: the patient was seen and examined on 12/24/2020  Brief hospital course: Past medical history of HTN, HLD, RBBB, CAD SP CABG, AAA SP repair, GI bleed, prostate cancer SP XRT, bladder tumor SP resection, hernia repair, SBO repair.  Patient presents with complaints of abdominal pain and nausea and found to have small bowel obstruction.  General surgery was consulted. Conservative measures initiated with NG tube insertion. Currently plan is continue close observation for tolerance of oral diet.  Assessment and Plan: 1.  Small bowel obstruction Secondary to adhesion. Passing gas. No abdominal pain. Tolerating soft diet. NG tube removed by surgery on 6/1. Had difficult placement for NG tube in the past due to hiatal hernia. General surgery currently signed off. Appreciate their assistance. Monitor overnight for tolerance of oral diet.  2.  Asymptomatic bacteriuria Currently no symptoms. UA had some pyuria. Monitor for now.  3.  Peripheral vascular disease Initially discussed with vascular surgery due to history of chronic appearing bilateral iliac artery aneurysm no intervention recommended.  Patient remains asymptomatic.  4.  CAD SP CABG HLD HTN Resuming home regimen other than carvedilol given soft pressures.  5.  Hypokalemia Currently being replaced.  6.  Constipation Currently being treated.  Body mass index is 23.73 kg/m.    Interventions:        Diet: Soft diet DVT Prophylaxis:   heparin injection 5,000 Units Start: 12/12/20 0815    Advance goals of care discussion: DNR  Family Communication: no family was present at bedside, at the time of interview.   Disposition:  Status is: Inpatient  Remains inpatient appropriate because:IV treatments appropriate due to intensity of illness or inability to take PO  Dispo: The patient is from: Home               Anticipated d/c is to: Home              Patient currently is not medically stable to d/c.   Difficult to place patient No  Subjective: Passing gas.  No nausea no vomiting.  Tolerating oral diet so far.  Physical Exam: General: Appear in mild distress, no Rash; Oral Mucosa Clear, moist. no Abnormal Neck Mass Or lumps, Conjunctiva normal  Cardiovascular: S1 and S2 Present, no Murmur, Respiratory: good respiratory effort, Bilateral Air entry present and CTA, no Crackles, no wheezes Abdomen: Bowel Sound present, Soft and no tenderness Extremities: no Pedal edema Neurology: alert and oriented to time, place, and person affect appropriate. no new focal deficit Gait not checked due to patient safety concerns  Vitals:   12/23/20 1221 12/23/20 2150 12/24/20 0608 12/24/20 1419  BP: 110/67 97/61 108/70 (!) 98/59  Pulse: 68 65 62 72  Resp: 16 18 20 16   Temp: 98 F (36.7 C) 98 F (36.7 C) 98.1 F (36.7 C) 97.6 F (36.4 C)  TempSrc: Oral   Oral  SpO2: 95% 94% 91% 97%  Weight:      Height:        Intake/Output Summary (Last 24 hours) at 12/24/2020 1559 Last data filed at 12/24/2020 1300 Gross per 24 hour  Intake 240 ml  Output --  Net 240 ml   Filed Weights   12/12/20 0323  Weight: 79.4 kg    Data Reviewed: I have personally reviewed and interpreted daily labs, tele strips, imaging. I reviewed all nursing notes, pharmacy notes, vitals, pertinent  old records I have discussed plan of care as described above with RN and patient/family.  CBC: Recent Labs  Lab 12/24/20 0437  WBC 7.1  HGB 11.8*  HCT 36.2*  MCV 92.1  PLT 417   Basic Metabolic Panel: Recent Labs  Lab 12/18/20 0517 12/19/20 0424 12/21/20 0519 12/24/20 0437  NA 137 141 139 135  K 3.9 4.2 3.7 3.4*  CL 103 102 105 104  CO2 23 22 27 22   GLUCOSE 65* 85 114* 113*  BUN 30* 40* 37* 40*  CREATININE 0.73 0.82 0.76 0.63  CALCIUM 9.2 9.4 9.4 9.0  MG  --   --   --  2.0    Studies: No results found.  Scheduled  Meds: . chlorhexidine  15 mL Mouth Rinse BID  . feeding supplement  1 Container Oral TID BM  . heparin  5,000 Units Subcutaneous Q8H  . lidocaine  1 patch Transdermal Q24H  . lip balm  1 application Topical BID  . polyethylene glycol  17 g Oral Daily  . sodium chloride flush  3 mL Intravenous Q12H   Continuous Infusions: . chlorproMAZINE (THORAZINE) IV Stopped (12/17/20 1006)  . promethazine (PHENERGAN) injection (IM or IVPB)     PRN Meds: acetaminophen, acetaminophen, alum & mag hydroxide-simeth, chlorproMAZINE (THORAZINE) IV, diazepam, HYDROmorphone (DILAUDID) injection, ketorolac, magic mouthwash, menthol-cetylpyridinium, methocarbamol, metoprolol tartrate, [DISCONTINUED] ondansetron **OR** ondansetron (ZOFRAN) IV, phenol, polyethylene glycol, promethazine (PHENERGAN) injection (IM or IVPB), traMADol  Time spent: 35 minutes  Author: Berle Mull, MD [facility] 12/24/2020 3:59 PM  To reach On-call, see care teams to locate the attending and reach out via [email or web address]. Between 7PM-7AM, please contact night-coverage If you still have difficulty reaching the attending provider, please page the [facility] (Director on Call) for [facility] on amion for assistance.

## 2020-12-24 NOTE — Plan of Care (Signed)
  Problem: Education: Goal: Knowledge of General Education information will improve Description: Including pain rating scale, medication(s)/side effects and non-pharmacologic comfort measures Outcome: Progressing   Problem: Clinical Measurements: Goal: Will remain free from infection Outcome: Progressing Goal: Respiratory complications will improve Outcome: Progressing   Problem: Nutrition: Goal: Adequate nutrition will be maintained Outcome: Progressing   

## 2020-12-24 NOTE — NC FL2 (Signed)
[place] LEVEL OF CARE SCREENING TOOL     IDENTIFICATION  Patient Name: Jesse [name] Birthdate: 1927-11-29 Sex: male Admission Date (Current Location): 12/12/2020  [facility] and [place] Number:  Herbalist and Address:  [facility],  [place], [place]      Provider Number: [id number]  Attending Physician Name and Address:  Lavina Hamman, MD  Relative Name and Phone Number:  [name] son [phone]    Current Level of Care: Hospital Recommended Level of Care: [place] Prior Approval Number:    Date Approved/Denied:   PASRR Number:    Discharge Plan: Other (Comment) (ALF-[facility])    Current Diagnoses: Patient Active Problem List   Diagnosis Date Noted  . Palliative care by specialist   . SBO (small bowel obstruction) ([place]) 12/12/2020  . Abducens (sixth) nerve palsy, left 02/20/2020  . Binocular vision disorder with diplopia 02/20/2020  . Gross hematuria 08/15/2019  . History of prostate cancer 08/15/2019  . Anxiety 06/21/2019  . Anemia due to chronic illness 06/20/2019  . Pneumonia due to COVID-19 virus 06/20/2019  . Displaced spiral fracture of shaft of right femur, initial encounter for open fracture type I or II ([place]) 06/20/2019  . COVID-19 virus infection 06/19/2019  . Goals of care, counseling/discussion   . Hip fracture ([place]) 04/15/2019  . Fall   . Coronary artery disease involving native coronary artery of native heart without angina pectoris 11/20/2018  . Thoracic aortic aneurysm without rupture ([place]) 11/20/2018  . Chronic systolic heart failure ([place]) 11/20/2018  . Duodenal ulcer with hemorrhage 05/05/2018  . Hemorrhagic shock ([place]) 05/05/2018  . AKI (acute kidney injury) ([place]) 05/05/2018  . Orthostatic hypotension 05/04/2018  . Symptomatic anemia 05/04/2018  . Melena 05/04/2018  . GI bleed 05/04/2018  . Patella fracture 06/09/2015  . Aftercare following surgery of the  circulatory system, [place] 01/08/2013  . Pain in limb- Left popliteal 12/25/2012  . Aneurysm artery, femoral ([place]) 05/22/2012  . 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 Aneurysm of abdominal vessel ([place]) 02/21/2012  . Iliac artery aneurysm, bilateral ([place]) 08/09/2011  . Aneurysm of artery of lower extremity ([place]) 08/09/2011  . HLD (hyperlipidemia) 01/16/2009  . Essential hypertension 01/16/2009  . CAD, ARTERY BYPASS GRAFT 01/16/2009  . PERIPHERAL VASCULAR DISEASE 01/16/2009  . ABDOMINAL AORTIC ANEURYSM REPAIR, HX OF 01/16/2009  . MIXED HYPERLIPIDEMIA 12/28/2008    Orientation RESPIRATION BLADDER Height & Weight     Self,Time,Situation,Place  Normal Continent Weight: 79.4 kg Height:  6' (182.9 cm)  BEHAVIORAL SYMPTOMS/MOOD NEUROLOGICAL BOWEL NUTRITION STATUS      Continent Diet (Reg)  AMBULATORY STATUS COMMUNICATION OF NEEDS Skin   Independent Verbally Normal                       Personal Care Assistance Level of Assistance  Bathing,Feeding,Dressing Bathing Assistance: Independent Feeding assistance: Independent Dressing Assistance: Independent     Functional Limitations Info  Sight,Hearing,Speech Sight Info: Impaired (eyeglasses) Hearing Info: Adequate Speech Info: Adequate    SPECIAL CARE FACTORS FREQUENCY  PT (By licensed PT)     PT Frequency: 1-2x week              Contractures Contractures Info: Not present    Additional Factors Info  Code Status,Allergies Code Status Info:  (DNR) Allergies Info:  (itching,rash)           Current Medications (12/24/2020):  This is the current hospital  active medication list Current Facility-Administered Medications  Medication Dose Route Frequency Provider Last Rate Last Admin  . acetaminophen (TYLENOL) suppository 650 mg  650 mg Rectal Q4H PRN Darliss Cheney, MD   650 mg at 12/16/20 1459  . acetaminophen (TYLENOL) tablet 650 mg  650 mg Oral Q6H PRN Meuth, Brooke A, PA-C      . alum & mag hydroxide-simeth  (MAALOX/MYLANTA) 200-200-20 MG/5ML suspension 30 mL  30 mL Oral Q6H PRN [name], MD      . chlorhexidine (PERIDEX) 0.12 % solution 15 mL  15 mL Mouth Rinse BID Nicole Kindred A, DO   15 mL at 12/24/20 0908  . chlorproMAZINE (THORAZINE) 12.5 mg in sodium chloride 0.9 % 25 mL IVPB  12.5 mg Intravenous U2P PRN Pershing Proud, NP   Stopped at 12/17/20 1006  . diazepam (VALIUM) injection 5 mg  5 mg Intravenous Q8H PRN Nicole Kindred A, DO   5 mg at 12/23/20 0019  . feeding supplement (BOOST / RESOURCE BREEZE) liquid 1 Container  1 Container Oral TID BM Meuth, Brooke A, PA-C   1 Container at 12/24/20 0911  . heparin injection 5,000 Units  5,000 Units Subcutaneous Q8H [name], MD   5,000 Units at 12/24/20 0602  . HYDROmorphone (DILAUDID) injection 0.5 mg  0.5 mg Intravenous Q4H PRN Meuth, Brooke A, PA-C      . ketorolac (TORADOL) 15 MG/ML injection 15 mg  15 mg Intravenous Q6H PRN Mansy, Jan A, MD   15 mg at 12/24/20 1048  . lidocaine (LIDODERM) 5 % 1 patch  1 patch Transdermal N36R Pershing Proud, NP   1 patch at 12/23/20 2102  . lip balm (CARMEX) ointment 1 application  1 application Topical BID [name], MD   1 application at 44/31/54 2102  . magic mouthwash  15 mL Oral QID PRN [name], MD      . menthol-cetylpyridinium (CEPACOL) lozenge 3 mg  1 lozenge Oral PRN [name], MD      . methocarbamol (ROBAXIN) tablet 500 mg  500 mg Oral Q6H PRN Meuth, Brooke A, PA-C      . metoprolol tartrate (LOPRESSOR) injection 5 mg  5 mg Intravenous Q6H PRN [name], MD      . ondansetron [facility]) injection 4 mg  4 mg Intravenous Q6H PRN Nicole Kindred A, DO      . phenol (CHLORASEPTIC) mouth spray 2 spray  2 spray Mouth/Throat PRN [name], MD      . polyethylene glycol (MIRALAX / GLYCOLAX) packet 17 g  17 g Oral Daily PRN Meuth, Brooke A, PA-C   17 g at 12/24/20 0908  . polyethylene glycol (MIRALAX / GLYCOLAX) packet 17 g  17 g Oral Daily Lavina Hamman, MD   17 g at  12/24/20 0910  . promethazine (PHENERGAN) 12.5 mg in sodium chloride 0.9 % 50 mL IVPB  12.5 mg Intravenous M0Q PRN Vinie Sill C, NP      . sodium chloride flush (NS) 0.9 % injection 3 mL  3 mL Intravenous Q12H [name], MD   3 mL at 12/24/20 0910  . traMADol (ULTRAM) tablet 50 mg  50 mg Oral Q6H PRN Meuth, Brooke A, PA-C   50 mg at 12/23/20 2318     Discharge Medications: Please see discharge summary for a list of discharge medications.  Relevant Imaging Results:  Relevant Lab Results:   Additional Information ssn# [id number]  Abdou Stocks,  Juliann Pulse, RN

## 2020-12-24 NOTE — Progress Notes (Signed)
Physical Therapy Treatment Patient Details Name: Jesse [name] MRN: [id number] DOB: 11/24/1927 Today's Date: 12/24/2020    History of Present Illness Patient admitted with SBO. Multiple attempts failed for NG tube placement and subsequently it was placed by IR on 12/15/2020.  This is a 87 year old male with past medical history of hypertension, hyperlipidemia, RBBB, CAD s/p CABG, AAA s/p repair and s/p bilateral femoral aneurysm repair in '95, thoracic aneurysm, GI bleed, prostate cancer s/p XRT, bladder tumor s/p resection, distant repair of bowel obstruction, hernia repair with mesh who presented to the ED from home with lower abdominal pain, nausea and dry heaves    PT Comments    Pt tolerates 24ft x2 with RW, seated rest break at halfway point due to R sciatica complaints. Pt noted with knee flexion in stance phase, decreased RLE stance time due to pain, no LOB. Pt tolerates BLE strengthening exercises, cues for motor control. Continue to progress acute PT as able.   Follow Up Recommendations  Home health PT     Equipment Recommendations  None recommended by PT    Recommendations for Other Services       Precautions / Restrictions Precautions Precautions: Fall Precaution Comments: reports history of falls Restrictions Weight Bearing Restrictions: No    Mobility  Bed Mobility Overal bed mobility: Needs Assistance Bed Mobility: Supine to Sit  Supine to sit: Supervision  General bed mobility comments: supv for safety, slow to mobilize to EOB    Transfers Overall transfer level: Needs assistance Equipment used: Rolling walker (2 wheeled) Transfers: Sit to/from Stand Sit to Stand: Min assist  General transfer comment: min A from low surfaces, BUE assisting to power up  Ambulation/Gait Ambulation/Gait assistance: Min guard Gait Distance (Feet): 80 Feet (x2) Assistive device: Rolling walker (2 wheeled) Gait Pattern/deviations: Step-through pattern;Decreased stride  length;Antalgic Gait velocity: decreased   General Gait Details: pt maintains bil knee flexion in stance, decreased RLE stance time due to pain complaints, no LOB, R sciatica pain requiring seated rest break halfway through   [employer] Mobility    Modified Rankin (Stroke Patients Only)       Balance Overall balance assessment: Needs assistance Sitting-balance support: Feet supported Sitting balance-Leahy Scale: Good Sitting balance - Comments: seated EOB   Standing balance support: During functional activity;Bilateral upper extremity supported Standing balance-Leahy Scale: Poor Standing balance comment: reliant on UE support         Cognition Arousal/Alertness: Awake/alert Behavior During Therapy: WFL for tasks assessed/performed Overall Cognitive Status: Within Functional Limits for tasks assessed       Exercises General Exercises - Lower Extremity Long Arc Quad: Seated;AROM;Strengthening;Both;10 reps Hip Flexion/Marching: Seated;AROM;Strengthening;Both;10 reps    General Comments        Pertinent Vitals/Pain Pain Assessment: Faces Faces Pain Scale: Hurts whole lot Pain Location: R sciatica Pain Descriptors / Indicators: Grimacing;Discomfort;Sharp Pain Intervention(s): Limited activity within patient's tolerance;Monitored during session;Repositioned    Home Living                      Prior Function            PT Goals (current goals can now be found in the care plan section) Acute Rehab PT Goals Patient Stated Goal: get back to independence PT Goal Formulation: With patient Time For Goal Achievement: 12/30/20 Potential to Achieve Goals: Good Progress towards PT goals: Progressing toward goals    Frequency  Min 3X/week      PT Plan Current plan remains appropriate    Co-evaluation              AM-PAC PT "6 Clicks" Mobility   Outcome Measure  Help needed turning from your back to your side while  in a flat bed without using bedrails?: A Little Help needed moving from lying on your back to sitting on the side of a flat bed without using bedrails?: A Little Help needed moving to and from a bed to a chair (including a wheelchair)?: A Little Help needed standing up from a chair using your arms (e.g., wheelchair or bedside chair)?: A Little Help needed to walk in hospital room?: A Little Help needed climbing 3-5 steps with a railing? : A Little 6 Click Score: 18    End of Session Equipment Utilized During Treatment: Gait belt Activity Tolerance: Patient tolerated treatment well Patient left: in chair;with call bell/phone within reach;with chair alarm set Nurse Communication: Mobility status PT Visit Diagnosis: Muscle weakness (generalized) (M62.81);Difficulty in walking, not elsewhere classified (R26.2)     Time: 6195-0932 PT Time Calculation (min) (ACUTE ONLY): 25 min  Charges:  $Gait Training: 8-22 mins $Therapeutic Exercise: 8-22 mins                      Tori Ayyub Krall PT, DPT 12/24/20, 2:55 PM

## 2020-12-24 NOTE — Progress Notes (Signed)
[facility] Progress Note     Subjective: CC-  Up in chair. Doing well. Denies abdominal pain, nausea, vomiting. Tolerating diet. BM yesterday.  Objective: Vital signs in last 24 hours: Temp:  [98 F (36.7 C)-98.1 F (36.7 C)] 98.1 F (36.7 C) (06/02 1751) Pulse Rate:  [62-68] 62 (06/02 0608) Resp:  [16-20] 20 (06/02 0608) BP: (97-110)/(61-70) 108/70 (06/02 0608) SpO2:  [91 %-95 %] 91 % (06/02 0608) Last BM Date: 12/13/20  Intake/Output from previous day: No intake/output data recorded. Intake/Output this shift: No intake/output data recorded.  PE: Gen:  Alert, NAD, pleasant Pulm: rate and effort normal on room air Abd: Soft, protuberant, nontender, +BS, soft/reducible ventral hernia Skin: no rashes noted, warm and dry  Lab Results:  Recent Labs    12/24/20 0437  WBC 7.1  HGB 11.8*  HCT 36.2*  PLT 279   BMET Recent Labs    12/24/20 0437  NA 135  K 3.4*  CL 104  CO2 22  GLUCOSE 113*  BUN 40*  CREATININE 0.63  CALCIUM 9.0   PT/INR No results for input(s): LABPROT, INR in the last 72 hours. CMP     Component Value Date/Time   NA 135 12/24/2020 0437   NA 141 03/02/2017 0753   K 3.4 (L) 12/24/2020 0437   CL 104 12/24/2020 0437   CO2 22 12/24/2020 0437   GLUCOSE 113 (H) 12/24/2020 0437   GLUCOSE 100 (H) 05/05/2006 0839   BUN 40 (H) 12/24/2020 0437   BUN 26 03/02/2017 0753   CREATININE 0.63 12/24/2020 0437   CREATININE 0.99 03/10/2014 1445   CALCIUM 9.0 12/24/2020 0437   PROT 7.0 12/12/2020 0338   PROT 5.6 (L) 03/02/2017 0753   ALBUMIN 4.1 12/12/2020 0338   ALBUMIN 3.7 03/02/2017 0753   AST 15 12/12/2020 0338   ALT 14 12/12/2020 0338   ALKPHOS 38 12/12/2020 0338   BILITOT 1.1 12/12/2020 0338   BILITOT 0.6 03/02/2017 0753   GFRNONAA >60 12/24/2020 0437   GFRAA >60 08/20/2019 0438   Lipase     Component Value Date/Time   LIPASE 29 12/12/2020 0338       Studies/Results: No results  found.  Anti-infectives: Anti-infectives (From admission, onward)   Start     Dose/Rate Route Frequency Ordered Stop   12/12/20 0715  cefTRIAXone (ROCEPHIN) 1 g in sodium chloride 0.9 % 100 mL IVPB        1 g 200 mL/hr over 30 Minutes Intravenous  Once 12/12/20 0706 12/12/20 0841       Assessment/Plan CAD s/p CABG Bilateral internal iliac artery aneurysms  Prior AAA repair Ventral hernia - reducible HTN HLD RBBB Prostate cancer s/p XRT Code status DNR/DNI  Recurrent SBO - Tolerating diet and having bowel function. [place] for discharge from surgical standpoint. We will sign off, please call with questions or concerns.   ID - rocephin x1 5/21 FEN - reg diet, Boost Foley - none VTE - sq heparin   LOS: 12 days    Wellington Hampshire, [facility] Surgery 12/24/2020, 9:45 AM Please see Amion for pager number during day hours 7:00am-4:30pm

## 2020-12-24 NOTE — Progress Notes (Signed)
Manufacturing engineer [facility])  Hospital Liaison: RN note         This patient has been referred to our palliative care services in the community at ALF.   [facility] will continue to follow for any discharge planning needs and to coordinate continuation of palliative care in the outpatient setting.    If you have questions or need assistance, please call [phone] or contact the hospital Liaison listed on AMION.      Thank you for this referral.         Farrel Gordon, RN, [place] Hospital Liaison   [phone]

## 2020-12-24 NOTE — Progress Notes (Signed)
Palliative Medicine Progress Note  HPI: 87 year old male with past medical history of hypertension, hyperlipidemia,RBBB,CAD s/p CABG, AAA s/p repair and s/p bilateral femoral aneurysm repair in '95, thoracic aneurysm, GI bleed,prostate cancer s/p XRT, bladder tumor s/p resection,distant repair of bowel obstruction, hernia repair with mesh who presented to the ED from home with lower abdominal pain and nausea and wasfound to have early or partial SBO with transition point along the ventral abdominal wall likely due to adhesions in the setting of prior abdominal surgery.   Subjective: Patient is alert and denies pain or distress. He is in good spirits and graciously voices his appreciation for all the care he has received. He understands we are now awaiting to hear back from [facility]F regarding his discharge back to the facility.   Outpatient palliative care services were explained and offered. He agrees to proceed with referral - as long as this is covered by health insurance. He shares his concern for his finances including medical bills. Therapeutic listening and emotional support was provided as Jesse [name] shares his thoughts and expectations for end of life. He states "I don't know if I have weeks or months left, and I don't know if my walking will get much better." Provided encouragement and counseled on the availability of palliative care for support of his needs as he navigates this phase of life and his chronic illnesses.   Questions and concerns addressed. PMT will continue to support holistically.    Physical Exam: Vital signs reviewed. He is in no acute distress, alert and oriented x3. Skin is warm and dry. Abdomen is soft and non-tender. Normal respiratory effort. Regular heart rate.   Assessment:  SBO, resolved  Plan: -Consult TOC for assistance with outpatient palliative care referral, assistance appreciated  -Psychosocial and emotional support provided   Total time: 15  minutes Greater than 50% of this time was spent in counseling and coordinating care related to the above assessment and plan.  Dorthy Cooler, PA-C Palliative Medicine Team Team phone # [phone]  Thank you for allowing the Palliative Medicine Team to assist in the care of this patient. Please utilize secure chat with additional questions, if there is no response within 30 minutes please call the above phone number.  Palliative Medicine Team providers are available by phone from 7am to 7pm daily and can be reached through the team cell phone.  Should this patient require assistance outside of these hours, please call the patient's attending physician.

## 2020-12-24 NOTE — Plan of Care (Signed)

## 2020-12-25 LAB — RESP PANEL BY RT-PCR (FLU A&B, COVID) ARPGX2
Influenza A by PCR: NEGATIVE
Influenza B by PCR: NEGATIVE
SARS Coronavirus 2 by RT PCR: NEGATIVE

## 2020-12-25 MED ORDER — SIMETHICONE 80 MG PO CHEW
80.0000 mg | CHEWABLE_TABLET | Freq: Four times a day (QID) | ORAL | Status: DC
  Administered 2020-12-25 (×2): 80 mg via ORAL
  Filled 2020-12-25 (×2): qty 1

## 2020-12-25 MED ORDER — DOCUSATE SODIUM 100 MG PO CAPS: 100.0000 mg | ORAL_CAPSULE | Freq: Two times a day (BID) | ORAL | 2 refills | Status: AC

## 2020-12-25 MED ORDER — DULCOLAX 5 MG PO TBEC: 5.0000 mg | DELAYED_RELEASE_TABLET | Freq: Every day | ORAL | 1 refills | Status: AC | PRN

## 2020-12-25 MED ORDER — POLYETHYLENE GLYCOL 3350 17 G PO PACK: 17.0000 g | PACK | Freq: Every day | ORAL | 0 refills | Status: AC

## 2020-12-25 MED ORDER — SIMETHICONE 80 MG PO CHEW: 80.0000 mg | CHEWABLE_TABLET | Freq: Four times a day (QID) | ORAL | 0 refills | Status: DC | PRN

## 2020-12-25 MED ORDER — CARVEDILOL 3.125 MG PO TABS: ORAL_TABLET | ORAL | 0 refills | Status: AC

## 2020-12-25 NOTE — TOC Transition Note (Signed)
Transition of Care [facility]) - CM/SW Discharge Note   Patient Details  Name: Jesse [name] MRN: [id number] Date of Birth: Nov 26, 1927  Transition of [facility]) CM/SW Contact:  Dessa Phi, RN Phone Number: 12/25/2020, 12:01 PM   Clinical Narrative:d/c back to [facility] [place]-rep [name] can accept back-faxed w/confirmation-signed fl2,d/c summary,HHPT orders. [name] son to transport home on own. No further CM needs.       Final next level of care: Assisted Living Barriers to Discharge: No Barriers Identified   Patient Goals and CMS Choice Patient states their goals for this hospitalization and ongoing recovery are:: return back to [place] [facility] [place] [facility] CMS Medicare.gov Compare Post Acute Care list provided to:: Patient Represenative (must comment) [name] son [phone]) Choice offered to / list presented to : Adult Children  Discharge Placement                       Discharge Plan and Services   Discharge Planning Services: CM Consult Post Acute Care Choice: Home Health                    HH Arranged: PT [place] Agency: Other - See comment ([facility] [place] has own in house HHPT-[place]) Date HH Agency Contacted: 12/25/20 Time [place]: 1200 Representative spoke with at [place]: [place] ([place]) Interventions     Readmission Risk Interventions No flowsheet data found.

## 2020-12-25 NOTE — Plan of Care (Signed)
  Problem: Education: Goal: Knowledge of General Education information will improve Description: Including pain rating scale, medication(s)/side effects and non-pharmacologic comfort measures 12/25/2020 1606 by Lennie Hummer, RN Outcome: Adequate for Discharge 12/25/2020 1248 by Lennie Hummer, RN Outcome: Progressing   Problem: Health Behavior/Discharge Planning: Goal: Ability to manage health-related needs will improve Outcome: Adequate for Discharge   Problem: Clinical Measurements: Goal: Ability to maintain clinical measurements within normal limits will improve 12/25/2020 1606 by Lennie Hummer, RN Outcome: Adequate for Discharge 12/25/2020 1248 by Lennie Hummer, RN Outcome: Progressing Goal: Will remain free from infection 12/25/2020 1606 by Lennie Hummer, RN Outcome: Adequate for Discharge 12/25/2020 1248 by Lennie Hummer, RN Outcome: Adequate for Discharge Goal: Diagnostic test results will improve Outcome: Adequate for Discharge Goal: Respiratory complications will improve 12/25/2020 1606 by Lennie Hummer, RN Outcome: Adequate for Discharge 12/25/2020 1248 by Lennie Hummer, RN Outcome: Adequate for Discharge Goal: Cardiovascular complication will be avoided Outcome: Adequate for Discharge   Problem: Activity: Goal: Risk for activity intolerance will decrease 12/25/2020 1606 by Lennie Hummer, RN Outcome: Adequate for Discharge 12/25/2020 1248 by Lennie Hummer, RN Outcome: Progressing   Problem: Nutrition: Goal: Adequate nutrition will be maintained Outcome: Adequate for Discharge   Problem: Coping: Goal: Level of anxiety will decrease Outcome: Adequate for Discharge   Problem: Elimination: Goal: Will not experience complications related to bowel motility 12/25/2020 1606 by Lennie Hummer, RN Outcome: Adequate for Discharge 12/25/2020 1248 by Lennie Hummer, RN Outcome: Progressing Goal: Will not experience complications related to urinary retention Outcome:  Adequate for Discharge   Problem: Pain Managment: Goal: General experience of comfort will improve 12/25/2020 1606 by Lennie Hummer, RN Outcome: Adequate for Discharge 12/25/2020 1248 by Lennie Hummer, RN Outcome: Progressing   Problem: Safety: Goal: Ability to remain free from injury will improve 12/25/2020 1606 by Lennie Hummer, RN Outcome: Adequate for Discharge 12/25/2020 1248 by Lennie Hummer, RN Outcome: Progressing   Problem: Skin Integrity: Goal: Risk for impaired skin integrity will decrease Outcome: Adequate for Discharge

## 2020-12-25 NOTE — TOC Transition Note (Addendum)
Transition of Care [facility]) - CM/SW Discharge Note   Patient Details  Name: Jesse [name] MRN: [id number] Date of Birth: 12-Jun-1928  Transition of Care [facility]) CM/SW Contact:  Dessa Phi, RN Phone Number: 12/25/2020, 12:04 PM   Clinical Narrative:d/c back to [facility] [place] [facility] rep [name] able to accept back-faxed w/confirmation [phone], signed fl2,HHPT orders,d/c summary. [name] son to transport home on own.[place] otpt PCS. No further CM needs.    1:17p-rapid covid needed-MD notified.   Final next level of care: Assisted Living Barriers to Discharge: No Barriers Identified   Patient Goals and CMS Choice Patient states their goals for this hospitalization and ongoing recovery are:: return back to [facility] [place] [facility] CMS Medicare.gov Compare Post Acute Care list provided to:: Patient Represenative (must comment) [name] son [phone]) Choice offered to / list presented to : Adult Children  Discharge Placement                       Discharge Plan and Services   Discharge Planning Services: CM Consult Post Acute Care Choice: Home Health                    HH Arranged: PT [place] Agency: Other - See comment ([facility] [place] has own in house HHPT-[place]) Date HH Agency Contacted: 12/25/20 Time [place]: 1200 Representative spoke with at [place]: [place] ([place]) Interventions     Readmission Risk Interventions No flowsheet data found.

## 2020-12-25 NOTE — Plan of Care (Signed)

## 2020-12-25 NOTE — Discharge Summary (Signed)
[facility] Discharge Summary   Patient: Jesse [name] GQQ:[id number]  PCP: [name], MD  Date of admission: 12/12/2020   Date of discharge:  12/25/2020     Discharge Diagnoses:  Principal Problem:   SBO (small bowel obstruction) ([place]) Active Problems:   HLD (hyperlipidemia)   Essential hypertension   CAD, ARTERY BYPASS GRAFT   Iliac artery aneurysm, bilateral ([facility])   Aneurysm of abdominal vessel ([place])   Goals of care, counseling/discussion   Palliative care by specialist   Admitted From: ILF Disposition:  ALF/ILF   Recommendations for Outpatient Follow-up:  1. PCP: follow up in 1 week 2. Establish care with [facility] for Palliative care 3. Follow up LABS/TEST:  none   Follow-up Information    [name], MD. Schedule an appointment as soon as possible for a visit in 1 week(s).   Specialty: Family Medicine Contact information: [place] [address] [phone]              Discharge Instructions    Diet - low sodium heart healthy   Complete by: As directed    Increase activity slowly   Complete by: As directed       Diet recommendation: Cardiac diet  Activity: The patient is advised to gradually reintroduce usual activities, as tolerated  Discharge Condition: stable  Code Status: DNR   History of present illness: As per the H and P dictated on admission, "This is a 87 year old male with past medical history of hypertension, hyperlipidemia, RBBB, CAD s/p CABG, AAA s/p repair and s/p bilateral femoral aneurysm repair in '95, thoracic aneurysm, GI bleed, prostate cancer s/p XRT, bladder tumor s/p resection, distant repair of bowel obstruction, hernia repair with mesh who presented to the ED from home with lower abdominal pain, nausea and dry heaves since 8 PM last night.  Currently admits to bloating, abdominal distention and intermittent abdominal pain from spasms and belching but no vomiting.  Last BM was yesterday but small.  He  denies fever, chills, dysuria, hematuria or changes in urination.  No other symptoms."  Hospital Course:  Summary of his active problems in the hospital is as following.   1.  Small bowel obstruction Secondary to adhesion. Passing gas. No abdominal pain. Tolerating soft diet. NG tube removed by surgery on 6/1. Had difficult placement for NG tube in the past due to hiatal hernia. General surgery currently signed off. Appreciate their assistance.  2.  Asymptomatic bacteriuria Currently no symptoms. UA had some pyuria. No Antibiotics  3.  Peripheral vascular disease Massive bilateral internal iliac artery aneurysms chronic  Initially discussed with vascular surgery due to history of chronic appearing bilateral iliac artery aneurysm no intervention recommended.  Patient remains asymptomatic.  4.  CAD SP CABG HLD HTN Continue home regimen  Coreg dose changed due to soft BP  5.  Hypokalemia replaced.  6.  Constipation Currently being treated.  Patient was ambulatory without any assistance. On the day of the discharge the patient's vitals were stable, and no other new acute medical condition were reported. The patient was felt safe to be discharge at ALF/ILF with Home health.  Consultants: General surgery  Procedures: NG insertion  DISCHARGE MEDICATION: Allergies as of 12/25/2020      Reactions   Quinolones Other (See Comments)   unknown   Adhesive [tape] Itching, Rash      Medication List    TAKE these medications   acetaminophen 500 MG tablet Commonly known as: TYLENOL Take 500  mg by mouth every 6 (six) hours as needed for mild pain or headache.   alendronate 70 MG tablet Commonly known as: FOSAMAX Take 70 mg by mouth every Saturday. Saturday   aspirin 81 MG tablet Take 1 tablet (81 mg total) by mouth daily.   carboxymethylcellulose 0.5 % Soln Commonly known as: REFRESH PLUS Apply 2 drops to eye at bedtime.   carvedilol 3.125 MG tablet Commonly known  as: COREG TAKE 1 TABLET BY MOUTH 2 TIMES DAILY WITH A MEAL What changed: medication strength   docusate sodium 100 MG capsule Commonly known as: Colace Take 1 capsule (100 mg total) by mouth 2 (two) times daily. What changed: how much to take   Dulcolax 5 MG EC tablet Generic drug: bisacodyl Take 1 tablet (5 mg total) by mouth daily as needed for moderate constipation.   fenofibrate 160 MG tablet Take 1 tablet (160 mg total) by mouth daily.   finasteride 5 MG tablet Commonly known as: PROSCAR Take 5 mg by mouth daily.   pantoprazole 40 MG tablet Commonly known as: PROTONIX Take 40 mg by mouth daily.   polyethylene glycol 17 g packet Commonly known as: MIRALAX / GLYCOLAX Take 17 g by mouth daily. Start taking on: December 26, 2020   sertraline 50 MG tablet Commonly known as: ZOLOFT Take 50 mg by mouth daily.   simethicone 80 MG chewable tablet Commonly known as: MYLICON Chew 1 tablet (80 mg total) by mouth 4 (four) times daily as needed for flatulence.   simvastatin 40 MG tablet Commonly known as: ZOCOR Take 1 tablet (40 mg total) by mouth daily at 6 PM.   sodium chloride 0.65 % Soln nasal spray Commonly known as: OCEAN Place 2 sprays into both nostrils 2 (two) times daily.   spironolactone 25 MG tablet Commonly known as: ALDACTONE Take 12.5 mg by mouth daily.   tamsulosin 0.4 MG Caps capsule Commonly known as: FLOMAX Take 0.4 mg by mouth daily.       Discharge Exam: Filed Weights   12/12/20 0323  Weight: 79.4 kg   Vitals:   12/24/20 2135 12/25/20 0449  BP: 129/77 113/70  Pulse: 72 64  Resp: 19 19  Temp: 97.7 F (36.5 C) 97.8 F (36.6 C)  SpO2: 100% 99%   General: Appear in mild distress, no Rash; Oral Mucosa Clear, moist. no Abnormal Neck Mass Or lumps, Conjunctiva normal  Cardiovascular: S1 and S2 Present, no Murmur, Respiratory: good respiratory effort, Bilateral Air entry present and CTA, no Crackles, no wheezes Abdomen: Bowel Sound present,  Soft and no tenderness Extremities: no Pedal edema Neurology: alert and oriented to time, place, and person affect appropriate. no new focal deficit Gait not checked due to patient safety concerns  The results of significant diagnostics from this hospitalization (including imaging, microbiology, ancillary and laboratory) are listed below for reference.    Significant Diagnostic Studies: CT ABDOMEN PELVIS WO CONTRAST  Result Date: 12/12/2020 CLINICAL DATA:  87 year old male with abdominal distension, increased left side abdominal pain and nausea since 2000 hours. EXAM: CT ABDOMEN AND PELVIS WITHOUT CONTRAST TECHNIQUE: Multidetector CT imaging of the abdomen and pelvis was performed following the standard protocol without IV contrast. COMPARISON:  CT Abdomen and Pelvis 08/15/2019 and earlier. FINDINGS: Lower chest: Mild cardiomegaly. Evidence of centrilobular emphysema. Lung base scarring and atelectasis is stable from last year. Hepatobiliary: Chronic cholelithiasis. No CT evidence of acute cholecystitis. Negative noncontrast liver. Pancreas: Atrophied, negative. Spleen: Negative. Adrenals/Urinary Tract: Normal adrenal glands. Stable nonobstructed kidneys.  Occasional renal cysts. Renal vascular calcifications. Diminutive urinary bladder mildly displaced anteriorly from internal iliac artery aneurysms detailed below. Stomach/Bowel: Chronic mass effect on the sigmoid colon from massive internal iliac artery aneurysms (see below), series 2, image 69 has increased since 2019. But the upstream sigmoid colon is nondilated. Redundant sigmoid with diverticulosis incidentally noted. Mild retained stool in the rectum. Mild retained stool through the more proximal colon with normal appendix on series 2, image 48. No large bowel inflammation. Negative terminal ileum. Fluid-filled small bowel loops in the abdomen are at the upper limits of normal to mildly dilated, and there is a relatively abrupt transition along the  ventral abdominal wall seen on series 2, image 48 and coronal image 34. No free air. No free fluid. Incidental duodenal diverticulum with no active inflammation. Stomach and duodenum not significantly dilated. Vascular/Lymphatic: Chronic Calcified aortic atherosclerosis. And repair of abdominal aortic aneurysm appears stable since 2019. Massive bilateral internal iliac artery aneurysms chronic but enlarged since 2019 (series 2, image 61 and coronal image 119 up to 82 x 95 mm now on the right side, versus 72 x 80 mm in 2019). Vascular patency is not evaluated in the absence of IV contrast. No lymphadenopathy. Reproductive: Negative. Other: No pelvic free fluid. Musculoskeletal: Osteopenia. Stable visualized osseous structures. Previous proximal femur ORIF. IMPRESSION: 1. Early or partial Small Bowel Obstruction suspected with a transition point along the ventral abdominal wall, likely due to adhesions in the setting of prior abdominal surgery. No free air or free fluid. 2. Massive bilateral internal iliac artery aneurysms are chronic but enlarged since 2019, now up to 82 x 95 mm on the right (versus 72 x 80 mm in 2019). Subsequent increased mass effect on the sigmoid colon as it traverses the mid pelvis, but no large bowel obstruction at this time. 3. Superimposed prior abdominal aortic aneurysm repair appears stable. Aortic Atherosclerosis (ICD10-I70.0). 4. Chronic cholelithiasis. 5. Emphysema (ICD10-J43.9).  Mild cardiomegaly. Electronically Signed   By: Genevie Ann M.D.   On: 12/12/2020 06:59   IR [facility] Dys Tube [employer] W/FL W/Rad  Result Date: 12/15/2020 CLINICAL DATA:  87 year old with small bowel obstruction and needs a nasogastric tube. Nasogastric tube could not be placed on the floor. EXAM: PLACEMENT OF NASOGASTRIC TUBE WITH FLUOROSCOPY ANESTHESIA/SEDATION: None MEDICATIONS: Viscous lidocaine CONTRAST:  None PROCEDURE: Viscous lidocaine was placed in the right nostril. 16 French nasogastric tube was advanced easily  through the right nostril. The tube was preferentially going into the airway based on fluoroscopy. Using a lateral projection, the tube was successfully advanced into the proximal esophagus. Tube was advanced into the stomach. The tube was secured to the patient's nose. COMPLICATIONS: None immediate FINDINGS: Nasogastric tube was preferentially going into the airway. Nasogastric tube was successfully navigated into the esophagus using fluoroscopy. Nasogastric tube tip was placed in the stomach at the end of the procedure. Fluoroscopic images were taken and saved for this procedure. IMPRESSION: Successful placement of a nasogastric tube with fluoroscopy. Electronically Signed   By: Markus Daft M.D.   On: 12/15/2020 14:53   DG Abd Portable 1V-Small Bowel Obstruction Protocol-24 hr delay  Result Date: 12/19/2020 CLINICAL DATA:  24 hour delayed image for a small bowel obstruction. EXAM: PORTABLE ABDOMEN - 1 VIEW COMPARISON:  Dec 18, 2020 FINDINGS: Oral contrast opacifies parts of the colon and distal small bowel. Enteric catheter overlies expected location of gastric body. Stable postsurgical changes in the abdomen. IMPRESSION: Oral contrast opacifies parts of the colon and distal small  bowel, advanced from the prior radiograph dated Dec 18, 2020 at 9:07 p.m. Electronically Signed   By: Fidela Salisbury M.D.   On: 12/19/2020 14:16   DG Abd Portable 1V  Result Date: 12/18/2020 CLINICAL DATA:  Small-bowel obstruction 8 hour delay EXAM: PORTABLE ABDOMEN - 1 VIEW COMPARISON:  12/18/2020, CT 12/12/2020 FINDINGS: Contrast is present within nondilated small bowel as well as the right colon. Esophageal tube tip overlies the distal stomach. Multiple surgical clips. IMPRESSION: Contrast has reached the right colon. Contrast is present within multiple nondilated loops of small bowel. Electronically Signed   By: Donavan Foil M.D.   On: 12/18/2020 21:34   DG Abd Portable 1V-Small Bowel Obstruction Protocol-initial, 8 hr  delay  Result Date: 12/18/2020 CLINICAL DATA:  Small-bowel obstruction, diffuse abdominal pain EXAM: PORTABLE ABDOMEN - 1 VIEW COMPARISON:  Portable exam 0842 hours compared to 01/01/2016 FINDINGS: Tip of nasogastric tube projects over gastric antrum. Nonspecific bowel gas pattern. Single air-filled nonspecific loop of small bowel in LEFT mid abdomen. No bowel wall thickening. Stool present in rectum. Bones demineralized with degenerative changes of the lumbar spine with scoliosis. Orthopedic hardware at the proximal femora bilaterally. IMPRESSION: Single prominent loop of small bowel in the LEFT mid abdomen without definite evidence of obstruction. Electronically Signed   By: Lavonia Dana M.D.   On: 12/18/2020 10:53   DG Abd Portable 2V  Result Date: 12/21/2020 CLINICAL DATA:  Check gastric catheter placement EXAM: PORTABLE ABDOMEN - 2 VIEW COMPARISON:  Film from 12/19/2020 FINDINGS: Gastric catheter is noted coiled within the stomach. Contrast material is noted throughout the colon. IMPRESSION: Gastric catheter within the stomach. Electronically Signed   By: Inez Catalina M.D.   On: 12/21/2020 15:04    Microbiology: No results found for this or any previous visit (from the past 240 hour(s)).   Labs: CBC: Recent Labs  Lab 12/24/20 0437  WBC 7.1  HGB 11.8*  HCT 36.2*  MCV 92.1  PLT 116   Basic Metabolic Panel: Recent Labs  Lab 12/19/20 0424 12/21/20 0519 12/24/20 0437  NA 141 139 135  K 4.2 3.7 3.4*  CL 102 105 104  CO2 22 27 22   GLUCOSE 85 114* 113*  BUN 40* 37* 40*  CREATININE 0.82 0.76 0.63  CALCIUM 9.4 9.4 9.0  MG  --   --  2.0   Liver Function Tests: No results for input(s): AST, ALT, ALKPHOS, BILITOT, PROT, ALBUMIN in the last 168 hours. CBG: No results for input(s): GLUCAP in the last 168 hours.  Time spent: 35 minutes  Signed:  Berle Mull  [facility]  12/25/2020 11:30 AM

## 2020-12-25 NOTE — Care Management Important Message (Signed)
Important Message  Patient Details IM Letter placed in Patient's room. Name: Jesse [name] MRN: [id number] Date of Birth: 05-10-1928   Medicare Important Message Given:  Yes     Kerin Salen 12/25/2020, 11:05 AM

## 2020-12-25 NOTE — Plan of Care (Signed)
  Problem: Education: Goal: Knowledge of General Education information will improve Description Including pain rating scale, medication(s)/side effects and non-pharmacologic comfort measures Outcome: Progressing   Problem: Clinical Measurements: Goal: Ability to maintain clinical measurements within normal limits will improve Outcome: Progressing   Problem: Activity: Goal: Risk for activity intolerance will decrease Outcome: Progressing   Problem: Elimination: Goal: Will not experience complications related to bowel motility Outcome: Progressing   Problem: Pain Managment: Goal: General experience of comfort will improve Outcome: Progressing   Problem: Safety: Goal: Ability to remain free from injury will improve Outcome: Progressing   

## 2020-12-28 DIAGNOSIS — R278 Other lack of coordination: Secondary | ICD-10-CM | POA: Diagnosis not present

## 2020-12-28 DIAGNOSIS — S728X2S Other fracture of left femur, sequela: Secondary | ICD-10-CM | POA: Diagnosis not present

## 2020-12-28 DIAGNOSIS — S728X1S Other fracture of right femur, sequela: Secondary | ICD-10-CM | POA: Diagnosis not present

## 2020-12-28 DIAGNOSIS — R296 Repeated falls: Secondary | ICD-10-CM | POA: Diagnosis not present

## 2020-12-31 DIAGNOSIS — I5031 Acute diastolic (congestive) heart failure: Secondary | ICD-10-CM | POA: Diagnosis not present

## 2020-12-31 DIAGNOSIS — R600 Localized edema: Secondary | ICD-10-CM | POA: Diagnosis not present

## 2020-12-31 DIAGNOSIS — I10 Essential (primary) hypertension: Secondary | ICD-10-CM | POA: Diagnosis not present

## 2020-12-31 DIAGNOSIS — E876 Hypokalemia: Secondary | ICD-10-CM | POA: Diagnosis not present

## 2021-01-01 DIAGNOSIS — R109 Unspecified abdominal pain: Secondary | ICD-10-CM | POA: Diagnosis not present

## 2021-01-05 DIAGNOSIS — S728X1S Other fracture of right femur, sequela: Secondary | ICD-10-CM | POA: Diagnosis not present

## 2021-01-05 DIAGNOSIS — I10 Essential (primary) hypertension: Secondary | ICD-10-CM | POA: Diagnosis not present

## 2021-01-05 DIAGNOSIS — R296 Repeated falls: Secondary | ICD-10-CM | POA: Diagnosis not present

## 2021-01-05 DIAGNOSIS — E569 Vitamin deficiency, unspecified: Secondary | ICD-10-CM | POA: Diagnosis not present

## 2021-01-05 DIAGNOSIS — S728X2S Other fracture of left femur, sequela: Secondary | ICD-10-CM | POA: Diagnosis not present

## 2021-01-06 DIAGNOSIS — R278 Other lack of coordination: Secondary | ICD-10-CM | POA: Diagnosis not present

## 2021-01-07 DIAGNOSIS — S728X1S Other fracture of right femur, sequela: Secondary | ICD-10-CM | POA: Diagnosis not present

## 2021-01-07 DIAGNOSIS — R296 Repeated falls: Secondary | ICD-10-CM | POA: Diagnosis not present

## 2021-01-07 DIAGNOSIS — S728X2S Other fracture of left femur, sequela: Secondary | ICD-10-CM | POA: Diagnosis not present

## 2021-01-08 DIAGNOSIS — R278 Other lack of coordination: Secondary | ICD-10-CM | POA: Diagnosis not present

## 2021-01-11 DIAGNOSIS — R278 Other lack of coordination: Secondary | ICD-10-CM | POA: Diagnosis not present

## 2021-01-12 DIAGNOSIS — M79674 Pain in right toe(s): Secondary | ICD-10-CM | POA: Diagnosis not present

## 2021-01-12 DIAGNOSIS — B351 Tinea unguium: Secondary | ICD-10-CM | POA: Diagnosis not present

## 2021-01-12 DIAGNOSIS — R278 Other lack of coordination: Secondary | ICD-10-CM | POA: Diagnosis not present

## 2021-01-12 DIAGNOSIS — M2041 Other hammer toe(s) (acquired), right foot: Secondary | ICD-10-CM | POA: Diagnosis not present

## 2021-01-12 DIAGNOSIS — I739 Peripheral vascular disease, unspecified: Secondary | ICD-10-CM | POA: Diagnosis not present

## 2021-01-14 ENCOUNTER — Non-Acute Institutional Stay: Payer: Medicare Other | Admitting: Nurse Practitioner

## 2021-01-14 ENCOUNTER — Other Ambulatory Visit: Payer: Self-pay

## 2021-01-14 DIAGNOSIS — R278 Other lack of coordination: Secondary | ICD-10-CM | POA: Diagnosis not present

## 2021-01-14 DIAGNOSIS — R531 Weakness: Secondary | ICD-10-CM

## 2021-01-14 DIAGNOSIS — Z515 Encounter for palliative care: Secondary | ICD-10-CM

## 2021-01-14 NOTE — Progress Notes (Signed)
Therapist, nutritional Palliative Care Consult Note Telephone: [phone]  Fax: [phone]    Date of encounter: 01/14/21 PATIENT NAME: Jesse [name] [address] [place] [address]   [phone] (home)  DOB: 1927-08-10 MRN: [id number]  PRIMARY CARE PROVIDER:    Sigmund Hazel, MD,  [address] [place] [address] [phone]  REFERRING PROVIDER:   Sigmund Hazel, MD [address] [place],  [place] [address] [phone]  RESPONSIBLE PARTY:    Contact Information     Name Relation Home Work [place] Son [phone]  [phone]   Santiago Bumpers Daughter [phone]  [phone]     I met face to face with patient in facility.  Palliative Care was asked to follow this patient by consultation request of  Sigmund Hazel, MD to address advance care planning and complex medical decision making. This is the initial visit.                                   ASSESSMENT AND PLAN / RECOMMENDATIONS:   Advance Care Planning/Goals of Care: Goals include to maximize quality of life and symptom management. Our advance care planning conversation included a discussion about:    The value and importance of advance care planning  Experiences with loved ones who have been seriously ill or have died  Exploration of personal, cultural or spiritual beliefs that might influence medical decisions  Exploration of goals of care in the event of a sudden injury or illness  Review and updating or creation of an  advance directive document . CODE STATUS: DNR Goal of care: Patient's goal of care is comfort while preserving function. Directives: Signed DNR present on file in the facility, copy on [facility] Epic EMR. Patient reiterated desire to not be resuscitated in the event of cardiac or respiratory arrest, saying he is at peace with his decision. Validation provided.  Symptom Management/Plan: Generalized weakness: Patient currently receives  physical therapy four times a week in the facility. Continue current plan of care. Discussed fall precautions, encouraged consistent use of assistive device during ambulation. Provided general support and encouragement. Questions and concerns were addressed. Patient was encouraged to call with questions and/or concerns. My business card was provided.  Follow up Palliative Care Visit: Palliative care will continue to follow for complex medical decision making, advance care planning, and clarification of goals. Return as needed.  PPS: 60%  HOSPICE ELIGIBILITY/DIAGNOSIS: TBD  Chief Complaint: Generalized weakness  History obtained from review of Epic EMR, facility chart, discussion with primary team, and interview with Mr. Milner.  HISTORY OF PRESENT ILLNESS:  Jesse [name] is a 87 y.o. year old male with multiple medical problems including  hypertension, hyperlipidemia, RBBB, CAD s/p CABG, AAA s/p repair and s/p bilateral femoral aneurysm repair in '95, thoracic aneurysm, GI bleed, prostate cancer s/p XRT, bladder tumor s/p resection, distant repair of bowel obstruction, hernia repair with mesh. Patient with complaints of generalized weakness in the context of recent hospitalization 12/12/2020 to 12/25/2020 for small bowel obstruction. Patient receiving physical therapy report progressive improvement in physical function. He report also going to the facility gym for exercise sessions to increase his strength. Patient is independent with his ADLs, ambulates with a front wheel walker, no report of recent falls. He report good appetite, denied any uncontrolled pain. Denied fever, denied chills, denied nausea or vomiting.   I reviewed available labs,  medications, imaging, studies and related documents from the EMR.  Records reviewed and summarized above.   ROS General: NAD EYES: denies acute vision changes ENMT: denies dysphagia Cardiovascular: denies chest pain, denies DOE Pulmonary: denies cough,  denies increased SOB Abdomen: endorses good appetite, denies constipation, endorses continence of bowel GU: denies dysuria, endorses continence of urine MSK:  endorsed weakness,  no falls reported Skin: denies rashes or wounds Neurological: denies pain, denies insomnia Psych: Endorses positive mood Heme/lymph/immuno: denies bruises, abnormal bleeding  Physical Exam: Current and past weights: 174.6lbs Constitutional: NAD General: frail appearing, cooperative, sitting on a couch in his room in NAD EYES: anicteric sclera, no discharge  ENMT: intact hearing, oral mucous membranes moist CV: S1S2 normal, no LE edema Pulmonary: LCTA, no increased work of breathing, no cough, room air Abdomen: no ascites GU: deferred MSK: no sarcopenia, moves all extremities, ambulatory Skin: warm and dry, no rashes or wounds on visible skin Neuro: generalized weakness, no cognitive impairment Psych: non-anxious affect, A and O x 4 Hem/lymph/immuno: no widespread bruising  CURRENT PROBLEM LIST:  Patient Active Problem List   Diagnosis Date Noted   Palliative care by specialist    SBO (small bowel obstruction) ([place]) 12/12/2020   Abducens (sixth) nerve palsy, left 02/20/2020   Binocular vision disorder with diplopia 02/20/2020   Gross hematuria 08/15/2019   History of prostate cancer 08/15/2019   Anxiety 06/21/2019   Anemia due to chronic illness 06/20/2019   Pneumonia due to COVID-19 virus 06/20/2019   Displaced spiral fracture of shaft of right femur, initial encounter for open fracture type I or II ([place]) 06/20/2019   COVID-19 virus infection 06/19/2019   Goals of care, counseling/discussion    Hip fracture ([place]) 04/15/2019   Fall    Coronary artery disease involving native coronary artery of native heart without angina pectoris 11/20/2018   Thoracic aortic aneurysm without rupture ([place]) 50/35/4656   Chronic systolic heart failure ([place]) 11/20/2018   Duodenal ulcer with hemorrhage 05/05/2018    Hemorrhagic shock ([place]) 05/05/2018   AKI (acute kidney injury) ([place]) 05/05/2018   Orthostatic hypotension 05/04/2018   Symptomatic anemia 05/04/2018   Melena 05/04/2018   GI bleed 05/04/2018   Patella fracture 06/09/2015   Aftercare following surgery of the circulatory system, NEC 01/08/2013   Pain in limb- Left popliteal 12/25/2012   Aneurysm artery, femoral ([place]) 05/22/2012   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   Aneurysm of abdominal vessel ([place]) 02/21/2012   Iliac artery aneurysm, bilateral ([place]) 08/09/2011   Aneurysm of artery of lower extremity ([place]) 08/09/2011   HLD (hyperlipidemia) 01/16/2009   Essential hypertension 01/16/2009   CAD, ARTERY BYPASS GRAFT 01/16/2009   PERIPHERAL VASCULAR DISEASE 01/16/2009   ABDOMINAL AORTIC ANEURYSM REPAIR, HX OF 01/16/2009   MIXED HYPERLIPIDEMIA 12/28/2008   PAST MEDICAL HISTORY:  Active Ambulatory Problems    Diagnosis Date Noted   MIXED HYPERLIPIDEMIA 12/28/2008   HLD (hyperlipidemia) 01/16/2009   Essential hypertension 01/16/2009   CAD, ARTERY BYPASS GRAFT 01/16/2009   PERIPHERAL VASCULAR DISEASE 01/16/2009   ABDOMINAL AORTIC ANEURYSM REPAIR, HX OF 01/16/2009   Iliac artery aneurysm, bilateral ([place]) 08/09/2011   Aneurysm of artery of lower extremity ([place]) 08/09/2011   Aneurysm of abdominal vessel ([place]) 02/21/2012   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   Aneurysm artery, femoral ([place]) 05/22/2012   Pain in limb- Left popliteal 12/25/2012   Aftercare following surgery of the circulatory system, NEC 01/08/2013   Patella fracture 06/09/2015   Orthostatic hypotension 05/04/2018   Symptomatic anemia  05/04/2018   Melena 05/04/2018   GI bleed 05/04/2018   Duodenal ulcer with hemorrhage 05/05/2018   Hemorrhagic shock (H[facility]) 05/05/2018   AKI (acute kidney injury) (H[facility]) 05/05/2018   Coronary artery disease involving native coronary artery of native heart without angina pectoris 11/20/2018   Thoracic aortic aneurysm without  rupture ([facility]) 11/20/2018   Chronic systolic heart failure (HCC) 11/20/2018   Hip fracture (H[facility]) 04/15/2019   Fall    Goals of care, counseling/discussion    COVID-19 virus infection 06/19/2019   Anemia due to chronic illness 06/20/2019   Pneumonia due to COVID-19 virus 06/20/2019   Displaced spiral fracture of shaft of right femur, initial encounter for open fracture type I or II (HCC) 06/20/2019   Anxiety 06/21/2019   Gross hematuria 08/15/2019   History of prostate cancer 08/15/2019   Abducens (sixth) nerve palsy, left 02/20/2020   Binocular vision disorder with diplopia 02/20/2020   SBO (small bowel obstruction) (H[facility]) 12/12/2020   Palliative care by specialist    Resolved Ambulatory Problems    Diagnosis Date Noted   No Resolved Ambulatory Problems   Past Medical History:  Diagnosis Date   AAA (abdominal aortic aneurysm) (HCC)    Arthritis    Cancer (HCC) 2010   Coronary artery disease    Environmental allergies    GERD (gastroesophageal reflux disease)    HOH (hard of hearing)    Hyperlipidemia    Hypertension    Kidney stone 1968   Myocardial infarction (HCC) 1992   Pneumonia    RBBB    SOCIAL HX:  Social History   Tobacco Use   Smoking status: Former    Years: 20.00    Pack years: 0.00    Types: Cigarettes    Quit date: 07/25/1968    Years since quitting: 52.5   Smokeless tobacco: Never  Substance Use Topics   Alcohol use: No   FAMILY HX:  Family History  Problem Relation Age of Onset   Cancer Mother    Cancer Father    Varicose Veins Father    Cancer Sister    Diabetes Sister    Heart attack Sister      ALLERGIES:  Allergies  Allergen Reactions   Quinolones Other (See Comments)    unknown   Adhesive [Tape] Itching and Rash     PERTINENT MEDICATIONS:  Outpatient Encounter Medications as of 01/14/2021  Medication Sig   acetaminophen (TYLENOL) 500 MG tablet Take 500 mg by mouth every 6 (six) hours as needed for mild pain or headache.    alendronate (FOSAMAX) 70 MG tablet Take 70 mg by mouth every Saturday. Saturday   aspirin 81 MG tablet Take 1 tablet (81 mg total) by mouth daily.   bisacodyl (DULCOLAX) 5 MG EC tablet Take 1 tablet (5 mg total) by mouth daily as needed for moderate constipation.   carboxymethylcellulose (REFRESH PLUS) 0.5 % SOLN Apply 2 drops to eye at bedtime.   carvedilol (COREG) 3.125 MG tablet TAKE 1 TABLET BY MOUTH 2 TIMES DAILY WITH A MEAL   docusate sodium (COLACE) 100 MG capsule Take 1 capsule (100 mg total) by mouth 2 (two) times daily.   fenofibrate 160 MG tablet Take 1 tablet (160 mg total) by mouth daily.   finasteride (PROSCAR) 5 MG tablet Take 5 mg by mouth daily.   pantoprazole (PROTONIX) 40 MG tablet Take 40 mg by mouth daily.   polyethylene glycol (MIRALAX / GLYCOLAX) 17 g packet Take 17 g by mouth daily.  sertraline (ZOLOFT) 50 MG tablet Take 50 mg by mouth daily.   simethicone (MYLICON) 80 MG chewable tablet Chew 1 tablet (80 mg total) by mouth 4 (four) times daily as needed for flatulence.   simvastatin (ZOCOR) 40 MG tablet Take 1 tablet (40 mg total) by mouth daily at 6 PM.   sodium chloride (OCEAN) 0.65 % SOLN nasal spray Place 2 sprays into both nostrils 2 (two) times daily.   spironolactone (ALDACTONE) 25 MG tablet Take 12.5 mg by mouth daily.   tamsulosin (FLOMAX) 0.4 MG CAPS capsule Take 0.4 mg by mouth daily.   No facility-administered encounter medications on file as of 01/14/2021.   I spent 48 minutes providing this consultation. More than 50% of the time in this consultation was spent in counseling and care coordination.  Thank you for the opportunity to participate in the care of Mr. Frier.  The palliative care team will continue to follow. Please call our office at [phone] if we can be of additional assistance.   Jari Favre, DNP, AGPCNP-BC  COVID-19 PATIENT SCREENING TOOL Asked and negative response unless otherwise noted:   Have you had symptoms of covid,  tested positive or been in contact with someone with symptoms/positive test in the past 5-10 days?

## 2021-01-15 ENCOUNTER — Telehealth: Payer: Self-pay | Admitting: Vascular Surgery

## 2021-01-15 NOTE — Telephone Encounter (Signed)
I spoke with the patient's son regarding his recent CT scan.  He was to see me for follow-up of known large internal iliac artery aneurysms.  He recently was admitted with a partial small bowel obstruction which was treated nonoperatively.  He did have a noncontrast CT during that admission.  I have reviewed the actual films.  The radiologist interpretation is that the internal iliac aneurysm is slightly larger than it was a year ago.  To my measurements there is been absolutely no change.  I discussed this with Mr. Justice Deeds son.  Explained the same issues we have discussed in the past.  Really does not have any good endovascular treatment and would be a major intra-abdominal surgery for correction.  Recommend repeat CT scan abdomen pelvis and runoff in 1 year with contrast.  We will coordinate this with an office visit

## 2021-01-18 DIAGNOSIS — R278 Other lack of coordination: Secondary | ICD-10-CM | POA: Diagnosis not present

## 2021-01-19 DIAGNOSIS — R278 Other lack of coordination: Secondary | ICD-10-CM | POA: Diagnosis not present

## 2021-01-22 ENCOUNTER — Inpatient Hospital Stay (HOSPITAL_COMMUNITY)
Admission: EM | Admit: 2021-01-22 | Discharge: 2021-01-26 | DRG: 669 | Disposition: A | Discharge status: Home Health | Payer: Medicare Other | Attending: Internal Medicine | Admitting: Internal Medicine

## 2021-01-22 ENCOUNTER — Encounter (HOSPITAL_COMMUNITY): Payer: Self-pay | Admitting: *Deleted

## 2021-01-22 ENCOUNTER — Other Ambulatory Visit: Payer: Self-pay

## 2021-01-22 ENCOUNTER — Emergency Department (HOSPITAL_COMMUNITY): Payer: Medicare Other

## 2021-01-22 DIAGNOSIS — I110 Hypertensive heart disease with heart failure: Secondary | ICD-10-CM | POA: Diagnosis not present

## 2021-01-22 DIAGNOSIS — K8020 Calculus of gallbladder without cholecystitis without obstruction: Secondary | ICD-10-CM | POA: Diagnosis not present

## 2021-01-22 DIAGNOSIS — F419 Anxiety disorder, unspecified: Secondary | ICD-10-CM | POA: Diagnosis present

## 2021-01-22 DIAGNOSIS — R1084 Generalized abdominal pain: Secondary | ICD-10-CM | POA: Diagnosis not present

## 2021-01-22 DIAGNOSIS — I10 Essential (primary) hypertension: Secondary | ICD-10-CM | POA: Diagnosis present

## 2021-01-22 DIAGNOSIS — Z20822 Contact with and (suspected) exposure to covid-19: Secondary | ICD-10-CM | POA: Diagnosis present

## 2021-01-22 DIAGNOSIS — R319 Hematuria, unspecified: Secondary | ICD-10-CM | POA: Diagnosis present

## 2021-01-22 DIAGNOSIS — F32A Depression, unspecified: Secondary | ICD-10-CM | POA: Diagnosis present

## 2021-01-22 DIAGNOSIS — R310 Gross hematuria: Secondary | ICD-10-CM | POA: Diagnosis not present

## 2021-01-22 DIAGNOSIS — Z87440 Personal history of urinary (tract) infections: Secondary | ICD-10-CM

## 2021-01-22 DIAGNOSIS — Z79899 Other long term (current) drug therapy: Secondary | ICD-10-CM

## 2021-01-22 DIAGNOSIS — I5022 Chronic systolic (congestive) heart failure: Secondary | ICD-10-CM | POA: Diagnosis not present

## 2021-01-22 DIAGNOSIS — M6281 Muscle weakness (generalized): Secondary | ICD-10-CM | POA: Diagnosis present

## 2021-01-22 DIAGNOSIS — Z66 Do not resuscitate: Secondary | ICD-10-CM | POA: Diagnosis not present

## 2021-01-22 DIAGNOSIS — N3289 Other specified disorders of bladder: Secondary | ICD-10-CM | POA: Diagnosis present

## 2021-01-22 DIAGNOSIS — N3041 Irradiation cystitis with hematuria: Secondary | ICD-10-CM | POA: Diagnosis not present

## 2021-01-22 DIAGNOSIS — I4510 Unspecified right bundle-branch block: Secondary | ICD-10-CM | POA: Diagnosis not present

## 2021-01-22 DIAGNOSIS — Z8546 Personal history of malignant neoplasm of prostate: Secondary | ICD-10-CM

## 2021-01-22 DIAGNOSIS — K219 Gastro-esophageal reflux disease without esophagitis: Secondary | ICD-10-CM | POA: Diagnosis present

## 2021-01-22 DIAGNOSIS — Z87442 Personal history of urinary calculi: Secondary | ICD-10-CM | POA: Diagnosis not present

## 2021-01-22 DIAGNOSIS — Z7983 Long term (current) use of bisphosphonates: Secondary | ICD-10-CM

## 2021-01-22 DIAGNOSIS — Z91048 Other nonmedicinal substance allergy status: Secondary | ICD-10-CM

## 2021-01-22 DIAGNOSIS — Z885 Allergy status to narcotic agent status: Secondary | ICD-10-CM

## 2021-01-22 DIAGNOSIS — I252 Old myocardial infarction: Secondary | ICD-10-CM | POA: Diagnosis not present

## 2021-01-22 DIAGNOSIS — I2510 Atherosclerotic heart disease of native coronary artery without angina pectoris: Secondary | ICD-10-CM | POA: Diagnosis present

## 2021-01-22 DIAGNOSIS — Z923 Personal history of irradiation: Secondary | ICD-10-CM

## 2021-01-22 DIAGNOSIS — Z974 Presence of external hearing-aid: Secondary | ICD-10-CM | POA: Diagnosis not present

## 2021-01-22 DIAGNOSIS — Y633 Inadvertent exposure of patient to radiation during medical care: Secondary | ICD-10-CM | POA: Diagnosis present

## 2021-01-22 DIAGNOSIS — Z951 Presence of aortocoronary bypass graft: Secondary | ICD-10-CM

## 2021-01-22 DIAGNOSIS — I714 Abdominal aortic aneurysm, without rupture: Secondary | ICD-10-CM | POA: Diagnosis present

## 2021-01-22 DIAGNOSIS — Z7982 Long term (current) use of aspirin: Secondary | ICD-10-CM | POA: Diagnosis not present

## 2021-01-22 DIAGNOSIS — I723 Aneurysm of iliac artery: Secondary | ICD-10-CM | POA: Diagnosis not present

## 2021-01-22 DIAGNOSIS — N281 Cyst of kidney, acquired: Secondary | ICD-10-CM | POA: Diagnosis not present

## 2021-01-22 DIAGNOSIS — E782 Mixed hyperlipidemia: Secondary | ICD-10-CM | POA: Diagnosis not present

## 2021-01-22 DIAGNOSIS — R338 Other retention of urine: Secondary | ICD-10-CM | POA: Diagnosis not present

## 2021-01-22 DIAGNOSIS — Z8711 Personal history of peptic ulcer disease: Secondary | ICD-10-CM

## 2021-01-22 DIAGNOSIS — H9193 Unspecified hearing loss, bilateral: Secondary | ICD-10-CM | POA: Diagnosis not present

## 2021-01-22 DIAGNOSIS — Z8249 Family history of ischemic heart disease and other diseases of the circulatory system: Secondary | ICD-10-CM

## 2021-01-22 DIAGNOSIS — Z8616 Personal history of COVID-19: Secondary | ICD-10-CM | POA: Diagnosis not present

## 2021-01-22 DIAGNOSIS — Z8701 Personal history of pneumonia (recurrent): Secondary | ICD-10-CM

## 2021-01-22 DIAGNOSIS — N400 Enlarged prostate without lower urinary tract symptoms: Secondary | ICD-10-CM | POA: Diagnosis present

## 2021-01-22 DIAGNOSIS — N419 Inflammatory disease of prostate, unspecified: Secondary | ICD-10-CM | POA: Diagnosis present

## 2021-01-22 DIAGNOSIS — Z87891 Personal history of nicotine dependence: Secondary | ICD-10-CM

## 2021-01-22 DIAGNOSIS — R1030 Lower abdominal pain, unspecified: Secondary | ICD-10-CM | POA: Diagnosis not present

## 2021-01-22 LAB — COMPREHENSIVE METABOLIC PANEL
ALT: 28 U/L (ref 0–44)
AST: 28 U/L (ref 15–41)
Albumin: 4.2 g/dL (ref 3.5–5.0)
Alkaline Phosphatase: 57 U/L (ref 38–126)
Anion gap: 12 (ref 5–15)
BUN: 24 mg/dL — ABNORMAL HIGH (ref 8–23)
CO2: 21 mmol/L — ABNORMAL LOW (ref 22–32)
Calcium: 10.2 mg/dL (ref 8.9–10.3)
Chloride: 104 mmol/L (ref 98–111)
Creatinine, Ser: 0.94 mg/dL (ref 0.61–1.24)
GFR, Estimated: >60 mL/min (ref >60)
Glucose, Bld: 122 mg/dL — ABNORMAL HIGH (ref 70–99)
Potassium: 4.5 mmol/L (ref 3.5–5.1)
Sodium: 137 mmol/L (ref 135–145)
Total Bilirubin: 0.9 mg/dL (ref 0.3–1.2)
Total Protein: 7.3 g/dL (ref 6.5–8.1)

## 2021-01-22 LAB — CBC WITH DIFFERENTIAL/PLATELET
Abs Immature Granulocytes: 0.04 10*3/uL (ref 0.00–0.07)
Basophils Absolute: 0.1 10*3/uL (ref 0.0–0.1)
Basophils Relative: 1 %
Eosinophils Absolute: 0.2 10*3/uL (ref 0.0–0.5)
Eosinophils Relative: 2 %
HCT: 41.2 % (ref 39.0–52.0)
Hemoglobin: 13.3 g/dL (ref 13.0–17.0)
Immature Granulocytes: 0 %
Lymphocytes Relative: 11 %
Lymphs Abs: 1 10*3/uL (ref 0.7–4.0)
MCH: 29.6 pg (ref 26.0–34.0)
MCHC: 32.3 g/dL (ref 30.0–36.0)
MCV: 91.8 fL (ref 80.0–100.0)
Monocytes Absolute: 0.9 10*3/uL (ref 0.1–1.0)
Monocytes Relative: 10 %
Neutro Abs: 6.8 10*3/uL (ref 1.7–7.7)
Neutrophils Relative %: 76 %
Platelets: 326 10*3/uL (ref 150–400)
RBC: 4.49 MIL/uL (ref 4.22–5.81)
RDW: 14.2 % (ref 11.5–15.5)
WBC: 8.9 10*3/uL (ref 4.0–10.5)
nRBC: 0 % (ref 0.0–0.2)

## 2021-01-22 LAB — URINALYSIS, ROUTINE W REFLEX MICROSCOPIC

## 2021-01-22 LAB — URINALYSIS, MICROSCOPIC (REFLEX): RBC / HPF: >50 RBC/hpf (ref 0–5)

## 2021-01-22 LAB — RESP PANEL BY RT-PCR (FLU A&B, COVID) ARPGX2
Influenza A by PCR: NEGATIVE
Influenza B by PCR: NEGATIVE
SARS Coronavirus 2 by RT PCR: NEGATIVE

## 2021-01-22 MED ORDER — PANTOPRAZOLE SODIUM 40 MG PO TBEC
40.0000 mg | DELAYED_RELEASE_TABLET | Freq: Every day | ORAL | Status: DC
  Administered 2021-01-24 – 2021-01-26 (×3): 40 mg via ORAL
  Filled 2021-01-22 (×3): qty 1

## 2021-01-22 MED ORDER — TAMSULOSIN HCL 0.4 MG PO CAPS
0.4000 mg | ORAL_CAPSULE | Freq: Every day | ORAL | Status: DC
  Administered 2021-01-24 – 2021-01-26 (×3): 0.4 mg via ORAL
  Filled 2021-01-22 (×3): qty 1

## 2021-01-22 MED ORDER — FENTANYL CITRATE (PF) 100 MCG/2ML IJ SOLN: 50.0000 ug | Freq: Once | INTRAMUSCULAR | Status: AC

## 2021-01-22 MED ORDER — ACETAMINOPHEN 650 MG RE SUPP: 650.0000 mg | Freq: Four times a day (QID) | RECTAL | Status: DC | PRN

## 2021-01-22 MED ORDER — SODIUM CHLORIDE 0.9 % IR SOLN: 3000.0000 mL | Status: DC

## 2021-01-22 MED ORDER — FENTANYL CITRATE (PF) 100 MCG/2ML IJ SOLN
50.0000 ug | Freq: Once | INTRAMUSCULAR | Status: AC | PRN
  Administered 2021-01-22: 50 ug via INTRAVENOUS
  Filled 2021-01-22: qty 2

## 2021-01-22 MED ORDER — FENTANYL CITRATE (PF) 100 MCG/2ML IJ SOLN
INTRAMUSCULAR | Status: AC
  Administered 2021-01-22: 50 ug via INTRAVENOUS
  Filled 2021-01-22: qty 2

## 2021-01-22 MED ORDER — SODIUM CHLORIDE 0.9% FLUSH: 3.0000 mL | INTRAVENOUS | Status: DC | PRN

## 2021-01-22 MED ORDER — FINASTERIDE 5 MG PO TABS
5.0000 mg | ORAL_TABLET | Freq: Every day | ORAL | Status: DC
  Administered 2021-01-24 – 2021-01-26 (×3): 5 mg via ORAL
  Filled 2021-01-22 (×3): qty 1

## 2021-01-22 MED ORDER — SODIUM CHLORIDE 0.9 % IV SOLN: 250.0000 mL | INTRAVENOUS | Status: DC | PRN

## 2021-01-22 MED ORDER — POLYETHYLENE GLYCOL 3350 17 G PO PACK: 17.0000 g | PACK | Freq: Every day | ORAL | Status: DC | PRN

## 2021-01-22 MED ORDER — ONDANSETRON HCL 4 MG/2ML IJ SOLN: 4.0000 mg | Freq: Four times a day (QID) | INTRAMUSCULAR | Status: DC | PRN

## 2021-01-22 MED ORDER — SODIUM CHLORIDE 0.9 % IV SOLN
2.0000 g | Freq: Once | INTRAVENOUS | Status: AC
  Administered 2021-01-22: 2 g via INTRAVENOUS
  Filled 2021-01-22: qty 20

## 2021-01-22 MED ORDER — HYDROMORPHONE HCL 1 MG/ML IJ SOLN
0.5000 mg | Freq: Once | INTRAMUSCULAR | Status: AC
  Administered 2021-01-22: 0.5 mg via INTRAVENOUS
  Filled 2021-01-22: qty 1

## 2021-01-22 MED ORDER — SPIRONOLACTONE 12.5 MG HALF TABLET
12.5000 mg | ORAL_TABLET | Freq: Every day | ORAL | Status: DC
  Administered 2021-01-24 – 2021-01-26 (×3): 12.5 mg via ORAL
  Filled 2021-01-22 (×4): qty 1

## 2021-01-22 MED ORDER — SERTRALINE HCL 50 MG PO TABS
50.0000 mg | ORAL_TABLET | Freq: Every day | ORAL | Status: DC
  Administered 2021-01-24 – 2021-01-26 (×3): 50 mg via ORAL
  Filled 2021-01-22 (×3): qty 1

## 2021-01-22 MED ORDER — SODIUM CHLORIDE 0.9% FLUSH
3.0000 mL | Freq: Two times a day (BID) | INTRAVENOUS | Status: DC
  Administered 2021-01-23 – 2021-01-26 (×8): 3 mL via INTRAVENOUS

## 2021-01-22 MED ORDER — CARVEDILOL 3.125 MG PO TABS
3.1250 mg | ORAL_TABLET | Freq: Two times a day (BID) | ORAL | Status: DC
  Administered 2021-01-23 – 2021-01-26 (×4): 3.125 mg via ORAL
  Filled 2021-01-22 (×7): qty 1

## 2021-01-22 MED ORDER — BELLADONNA ALKALOIDS-OPIUM 16.2-30 MG RE SUPP
1.0000 | Freq: Once | RECTAL | Status: AC
  Administered 2021-01-22: 1 via RECTAL
  Filled 2021-01-22: qty 1

## 2021-01-22 MED ORDER — ONDANSETRON HCL 4 MG PO TABS: 4.0000 mg | ORAL_TABLET | Freq: Four times a day (QID) | ORAL | Status: DC | PRN

## 2021-01-22 MED ORDER — ACETAMINOPHEN 325 MG PO TABS
650.0000 mg | ORAL_TABLET | Freq: Four times a day (QID) | ORAL | Status: DC | PRN
  Administered 2021-01-22 – 2021-01-25 (×4): 650 mg via ORAL
  Filled 2021-01-22 (×4): qty 2

## 2021-01-22 MED ORDER — SIMVASTATIN 40 MG PO TABS
40.0000 mg | ORAL_TABLET | Freq: Every day | ORAL | Status: DC
  Administered 2021-01-23 – 2021-01-25 (×3): 40 mg via ORAL
  Filled 2021-01-22 (×3): qty 1

## 2021-01-22 MED ORDER — LIDOCAINE HCL URETHRAL/MUCOSAL 2 % EX GEL
1.0000 "application " | Freq: Once | CUTANEOUS | Status: AC
  Administered 2021-01-22: 1 via URETHRAL
  Filled 2021-01-22: qty 11

## 2021-01-22 NOTE — H&P (Signed)
History and Physical    OMARRI EICH KKX:[id number] DOB: 27-Sep-1927 DOA: 01/22/2021  PCP: [name], MD  Patient coming from: [facility]   Chief Complaint: Home  HPI: Jesse [name] is a 87 y.o. male with medical history significant of AAA, prostate cancer, coronary artery disease, GERD, hyperlipidemia, hypertension who presented to the hospital with severe abdominal pain and bloody urine.  He was in his usual state of health, had some chills overnight, then woke up this morning with abdominal pain.  He was also passing bright red blood in his urine.  He denies any other symptoms, no chest pain or shortness of breath, no nausea or vomiting.  ED Course: Foley catheter was placed and bladder was irrigated.  CT renal stone also showed extended bladder.  Urology came to see the patient in the emergency department, recommend irrigation, ceftriaxone and monitoring.  Review of Systems: As per HPI. Otherwise, all other review of systems reviewed and are negative.   Past Medical History:  Diagnosis Date   AAA (abdominal aortic aneurysm) ([place])    Arthritis    Cancer ([place]) 2010   prostate ( 40 Txs. of radiation treatments)   Coronary artery disease    Environmental allergies    GERD (gastroesophageal reflux disease)    History of prostate cancer 2010   HOH (hard of hearing)    wears bilateral hearing aids   Hyperlipidemia    Hypertension    Kidney stone 1968   Myocardial infarction ([place]) 1992   Pneumonia    hx of   RBBB     Past Surgical History:  Procedure Laterality Date   ABDOMINAL AORTIC ANEURYSM REPAIR  08-13-1993   Dr Cameron Sprang   CARDIAC CATHETERIZATION     COLONOSCOPY W/ POLYPECTOMY     CORONARY ARTERY BYPASS GRAFT  03-01-1991   Dr. Servando Snare   ESOPHAGOGASTRODUODENOSCOPY (EGD) WITH PROPOFOL N/A 05/04/2018   Procedure: ESOPHAGOGASTRODUODENOSCOPY (EGD) WITH PROPOFOL;  Surgeon: [name], MD;  Location: [facility];  Service: Gastroenterology;  Laterality: N/A;   EYE  SURGERY     Detached retina (Left eye); bilateral cataract removal   FALSE ANEURYSM REPAIR  05/07/2012   Procedure: REPAIR FALSE ANEURYSM;  Surgeon: Rosetta Posner, MD;  Location: [facility] OR;  Service: Vascular;  Laterality: Left;  Repair of Left Femoral Artery Aneurysm   HERNIA REPAIR     HOT HEMOSTASIS N/A 05/04/2018   Procedure: HOT HEMOSTASIS (ARGON PLASMA COAGULATION/BICAP);  Surgeon: [name], MD;  Location: [facility];  Service: Gastroenterology;  Laterality: N/A;   INTRAMEDULLARY (IM) NAIL INTERTROCHANTERIC Left 04/16/2019   Procedure: INTRAMEDULLARY (IM) NAIL INTERTROCHANTRIC;  Surgeon: Rod Can, MD;  Location: [facility];  Service: Orthopedics;  Laterality: Left;   INTRAMEDULLARY (IM) NAIL INTERTROCHANTERIC Right 06/17/2019   Procedure: RIGHT INTRAMEDULLARY (IM) NAIL INTERTROCHANTRIC;  Surgeon: Rod Can, MD;  Location: [facility];  Service: Orthopedics;  Laterality: Right;   IR NASO G TUBE PLC W/FL W/RAD  12/15/2020   ORIF PATELLA Right 06/09/2015   Procedure: OPEN REDUCTION INTERNAL (ORIF) FIXATION PATELLA;  Surgeon: Marybelle Killings, MD;  Location: [place];  Service: Orthopedics;  Laterality: Right;   repair of bowel obstruction  1999   Dr. Dalbert Batman   repair of ventral hernia  04-20-1994   P. Cameron Sprang MD   resection femoral artery  03/19/2012   Dr. [name] Early   [place] INJECTION  05/04/2018   Procedure: SUBMUCOSAL INJECTION;  Surgeon: [name], MD;  Location: [facility];  Service: Gastroenterology;;  TRANSURETHRAL RESECTION OF BLADDER TUMOR N/A 08/18/2019   Procedure: cysto fulgeration clot evacuation;  Surgeon: Cleon Gustin, MD;  Location: [facility];  Service: Urology;  Laterality: N/A;     reports that he quit smoking about 52 years ago. His smoking use included cigarettes. He has never used smokeless tobacco. He reports that he does not drink alcohol and does not use drugs.  Allergies  Allergen Reactions   Quinolones Other (See Comments)    unknown   Adhesive  [Tape] Itching and Rash    Family History  Problem Relation Age of Onset   Cancer Mother    Cancer Father    Varicose Veins Father    Cancer Sister    Diabetes Sister    Heart attack Sister      Prior to Admission medications   Medication Sig Start Date End Date Taking? Authorizing Provider  acetaminophen (TYLENOL) 500 MG tablet Take 500 mg by mouth every 6 (six) hours as needed for mild pain or headache.    [provider]  alendronate (FOSAMAX) 70 MG tablet Take 70 mg by mouth every Saturday. Saturday 08/14/19   [provider]  aspirin 81 MG tablet Take 1 tablet (81 mg total) by mouth daily. 08/26/19   Terrilee Croak, MD  bisacodyl (DULCOLAX) 5 MG EC tablet Take 1 tablet (5 mg total) by mouth daily as needed for moderate constipation. 12/25/20 12/25/21  Lavina Hamman, MD  carboxymethylcellulose (REFRESH PLUS) 0.5 % SOLN Apply 2 drops to eye at bedtime.    [provider]  carvedilol (COREG) 3.125 MG tablet TAKE 1 TABLET BY MOUTH 2 TIMES DAILY WITH A MEAL 12/25/20   Lavina Hamman, MD  docusate sodium (COLACE) 100 MG capsule Take 1 capsule (100 mg total) by mouth 2 (two) times daily. 12/25/20 12/25/21  Lavina Hamman, MD  fenofibrate 160 MG tablet Take 1 tablet (160 mg total) by mouth daily. 05/29/19   [name], MD  finasteride (PROSCAR) 5 MG tablet Take 5 mg by mouth daily. 02/10/20   [provider]  pantoprazole (PROTONIX) 40 MG tablet Take 40 mg by mouth daily.    [provider]  polyethylene glycol (MIRALAX / GLYCOLAX) 17 g packet Take 17 g by mouth daily. 12/26/20   Lavina Hamman, MD  sertraline (ZOLOFT) 50 MG tablet Take 50 mg by mouth daily. 12/04/20   [provider]  simethicone (MYLICON) 80 MG chewable tablet Chew 1 tablet (80 mg total) by mouth 4 (four) times daily as needed for flatulence. 12/25/20   Lavina Hamman, MD  simvastatin (ZOCOR) 40 MG tablet Take 1 tablet (40 mg total) by mouth daily at 6 PM. 05/29/19   [name], MD  sodium chloride (OCEAN) 0.65 % SOLN nasal spray Place 2 sprays into both nostrils 2 (two) times daily.    [provider]  spironolactone (ALDACTONE) 25 MG tablet Take 12.5 mg by mouth daily. 12/04/20   [provider]  tamsulosin (FLOMAX) 0.4 MG CAPS capsule Take 0.4 mg by mouth daily. 02/10/20   [provider]    Physical Exam: Vitals:   01/22/21 1227 01/22/21 1400 01/22/21 1404 01/22/21 1510  BP: (!) 144/93 (!) 134/93 (!) 134/93 (!) 137/102  Pulse: (!) 114 (!) 129 (!) 101 (!) 104  Resp: (!) 22 19 20  (!) 21  Temp:      TempSrc:      SpO2: 100% 95% 100% 100%  Weight:  Height:        Constitutional: NAD, calm, comfortable, hard of hearing Eyes: PERRL, lids and conjunctivae normal ENMT: Mucous membranes are moist. Normal dentition.  Respiratory: Clear to auscultation bilaterally, no wheezing, no crackles. Normal respiratory effort. No accessory muscle use. No conversational dyspnea  Cardiovascular: Regular rate and rhythm, no murmurs. No extremity edema.  Abdomen: Soft, nondistended, nontender to palpation.  Musculoskeletal: No joint deformity upper and lower extremities. No contractures. Normal muscle tone.  Skin: no rashes, lesions, ulcers on exposed skin  Neurologic: Alert and oriented, speech fluent, CN 2-12 grossly intact. No focal deficits.   Psychiatric: Normal judgment and insight. Normal mood and affect   Labs on Admission: I have personally reviewed following labs and imaging studies  CBC: Recent Labs  Lab 01/22/21 1152  WBC 8.9  NEUTROABS 6.8  HGB 13.3  HCT 41.2  MCV 91.8  PLT 427   Basic Metabolic Panel: Recent Labs  Lab 01/22/21 1152  NA 137  K 4.5  CL 104  CO2 21*  GLUCOSE 122*  BUN 24*  CREATININE 0.94  CALCIUM 10.2   GFR: Estimated Creatinine Clearance: 57.3 mL/min (by C-G formula based on SCr of 0.94 mg/dL). Liver Function Tests: Recent Labs  Lab 01/22/21 1152  AST 28  ALT 28  ALKPHOS 57   BILITOT 0.9  PROT 7.3  ALBUMIN 4.2   No results for input(s): LIPASE, AMYLASE in the last 168 hours. No results for input(s): AMMONIA in the last 168 hours. Coagulation Profile: No results for input(s): INR, PROTIME in the last 168 hours. Cardiac Enzymes: No results for input(s): CKTOTAL, CKMB, CKMBINDEX, TROPONINI in the last 168 hours. BNP (last 3 results) No results for input(s): PROBNP in the last 8760 hours. HbA1C: No results for input(s): HGBA1C in the last 72 hours. CBG: No results for input(s): GLUCAP in the last 168 hours. Lipid Profile: No results for input(s): CHOL, HDL, LDLCALC, TRIG, CHOLHDL, LDLDIRECT in the last 72 hours. Thyroid Function Tests: No results for input(s): TSH, T4TOTAL, FREET4, T3FREE, THYROIDAB in the last 72 hours. Anemia Panel: No results for input(s): VITAMINB12, FOLATE, FERRITIN, TIBC, IRON, RETICCTPCT in the last 72 hours. Urine analysis:    Component Value Date/Time   COLORURINE AMBER (A) 12/12/2020 0338   APPEARANCEUR HAZY (A) 12/12/2020 0338   LABSPEC 1.025 12/12/2020 0338   PHURINE 6.0 12/12/2020 0338   GLUCOSEU NEGATIVE 12/12/2020 0338   HGBUR LARGE (A) 12/12/2020 0338   BILIRUBINUR NEGATIVE 12/12/2020 0338   KETONESUR 5 (A) 12/12/2020 0338   PROTEINUR 100 (A) 12/12/2020 0338   UROBILINOGEN 2.0 (H) 05/28/2015 0029   NITRITE NEGATIVE 12/12/2020 0338   LEUKOCYTESUR MODERATE (A) 12/12/2020 0338   Sepsis Labs: !!!!!!!!!!!!!!!!!!!!!!!!!!!!!!!!!!!!!!!!!!!! @LABRCNTIP (procalcitonin:4,lacticidven:4) )No results found for this or any previous visit (from the past 240 hour(s)).   Radiological Exams on Admission: CT Renal Stone Study  Result Date: 01/22/2021 CLINICAL DATA:  Hematuria EXAM: CT ABDOMEN AND PELVIS WITHOUT CONTRAST TECHNIQUE: Multidetector CT imaging of the abdomen and pelvis was performed following the standard protocol without IV contrast. COMPARISON:  12/21/2020 FINDINGS: Lower chest: Mild reticulonodular scarring is noted in  the bases bilaterally. Hepatobiliary: Multiple gallstones are noted. No gallbladder wall thickening or pericholecystic fluid is noted. The liver is within normal limits. Pancreas: Unremarkable. No pancreatic ductal dilatation or surrounding inflammatory changes. Spleen: Normal in size without focal abnormality. Adrenals/Urinary Tract: Adrenal glands are within normal limits bilaterally. Cysts are seen in the kidneys bilaterally. Increased fullness of the collecting systems is noted bilaterally  likely related to compression on the ureter and bladder by internal iliac artery aneurysms. Bladder demonstrates diffuse increased attenuation within consistent with the known history of hematuria. Posterior defect in the hematocrit level is noted along the right posterior bladder wall best seen on image number 72 of series 5. This is of uncertain significance as no definitive mass was noted in this region on prior CT. Stomach/Bowel: The appendix is not well visualized. No inflammatory changes are seen. The colon and small bowel show no obstructive or inflammatory changes. Stomach is distended with fluid without obstructing lesion. Vascular/Lymphatic: Changes are noted consistent with prior abdominal aortic repair. The overall appearance is stable from the prior exam. Delineation of the lumen is difficult given the lack of IV contrast. Internal iliac artery aneurysms are noted bilaterally as well as common femoral artery aneurysms stable in appearance from the prior exam. Reproductive: Fiducial markers are noted in the prostate bed. Other: No abdominal wall hernia or abnormality. No abdominopelvic ascites. Musculoskeletal: Bilateral femur surgery is seen. Degenerative changes of lumbar spine are noted. IMPRESSION: Increased attenuating material in the bladder consistent with the known history of hematuria. A defect is noted in the posterior aspect of the blood/thrombus suspicious for a mass lesion although no thickening was  seen on recent CT examination in this region. Cholelithiasis without complicating factors. Bibasilar scarring similar to that seen on prior CT. Stable abdominal aortic aneurysm repair and bilateral iliac artery aneurysms similar to that seen on the recent exam. Electronically Signed   By: Inez Catalina M.D.   On: 01/22/2021 14:12     Assessment/Plan Principal Problem:   Gross hematuria Active Problems:   Mixed hyperlipidemia   Essential hypertension   Anxiety   History of prostate cancer   Gross hematuria -CT renal stone: Increased attenuating material in the bladder consistent with the known history of hematuria. A defect is noted in the posterior aspect of the blood/thrombus suspicious for a mass lesion although no thickening was seen on recent CT examination in this region. -Appreciate urology -Continue foley catheter, continue bladder irrigation -Ceftriaxone  -Hold aspirin for now  Hypertension -Continue Coreg, Aldactone  BPH -Continue Proscar, Flomax  GERD -Continue Protonix  Depression/anxiety -Continue Zoloft  Hyperlipidemia -Continue Zocor   DVT prophylaxis: SCD Code Status: DNR, confirmed with patient.  He stated " if I die, let me go" and that he did not wish to be kept alive artificially Family Communication: No family at bedside Disposition Plan: Pending further evaluation by urology team Consults called: Urology   Severity of Illness: The appropriate patient status for this patient is OBSERVATION. Observation status is judged to be reasonable and necessary in order to provide the required intensity of service to ensure the patient's safety. The patient's presenting symptoms, physical exam findings, and initial radiographic and laboratory data in the context of their medical condition is felt to place them at decreased risk for further clinical deterioration. Furthermore, it is anticipated that the patient will be medically stable for discharge from the hospital  within 2 midnights of admission.    Dessa Phi, DO [facility] 01/22/2021, 4:57 PM   Available via Epic secure chat 7am-7pm After these hours, please refer to coverage provider listed on [email or web address]

## 2021-01-22 NOTE — ED Provider Notes (Addendum)
[facility] DEPT Provider Note   CSN: [id number] Arrival date & time: 01/22/21  1001     History Chief Complaint  Patient presents with   Hematuria    Jesse [name] is a 87 y.o. male.  HPI     87 year old male comes in a chief complaint of hematuria.  Patient has history of AAA, prostate cancer, CAD.  He reports that he started having severe abdominal pain and bloody urine earlier today.  He denies any pain with urination.  Review of systems negative for any fevers, chills.  Patient is not on any blood thinner besides aspirin.  Past Medical History:  Diagnosis Date   AAA (abdominal aortic aneurysm) ([place])    Arthritis    Cancer ([place]) 2010   prostate ( 40 Txs. of radiation treatments)   Coronary artery disease    Environmental allergies    GERD (gastroesophageal reflux disease)    History of prostate cancer 2010   HOH (hard of hearing)    wears bilateral hearing aids   Hyperlipidemia    Hypertension    Kidney stone 1968   Myocardial infarction ([place]) 1992   Pneumonia    hx of   RBBB     Patient Active Problem List   Diagnosis Date Noted   Palliative care by specialist    SBO (small bowel obstruction) ([place]) 12/12/2020   Abducens (sixth) nerve palsy, left 02/20/2020   Binocular vision disorder with diplopia 02/20/2020   Gross hematuria 08/15/2019   History of prostate cancer 08/15/2019   Anxiety 06/21/2019   Anemia due to chronic illness 06/20/2019   Pneumonia due to COVID-19 virus 06/20/2019   Displaced spiral fracture of shaft of right femur, initial encounter for open fracture type I or II ([place]) 06/20/2019   COVID-19 virus infection 06/19/2019   Goals of care, counseling/discussion    Hip fracture ([place]) 04/15/2019   Fall    Coronary artery disease involving native coronary artery of native heart without angina pectoris 11/20/2018   Thoracic aortic aneurysm without rupture ([place]) 16/07/930   Chronic systolic heart failure ([place])  11/20/2018   Duodenal ulcer with hemorrhage 05/05/2018   Hemorrhagic shock ([place]) 05/05/2018   AKI (acute kidney injury) ([place]) 05/05/2018   Orthostatic hypotension 05/04/2018   Symptomatic anemia 05/04/2018   Melena 05/04/2018   GI bleed 05/04/2018   Patella fracture 06/09/2015   Aftercare following surgery of the circulatory system, NEC 01/08/2013   Pain in limb- Left popliteal 12/25/2012   Aneurysm artery, 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   Aneurysm of abdominal vessel ([place]) 02/21/2012   Iliac artery aneurysm, bilateral ([place]) 08/09/2011   Aneurysm of artery of lower extremity ([place]) 08/09/2011   HLD (hyperlipidemia) 01/16/2009   Essential hypertension 01/16/2009   CAD, ARTERY BYPASS GRAFT 01/16/2009   PERIPHERAL VASCULAR DISEASE 01/16/2009   ABDOMINAL AORTIC ANEURYSM REPAIR, HX OF 01/16/2009   MIXED HYPERLIPIDEMIA 12/28/2008    Past Surgical History:  Procedure Laterality Date   ABDOMINAL AORTIC ANEURYSM REPAIR  08-13-1993   Dr Cameron Sprang   CARDIAC CATHETERIZATION     COLONOSCOPY W/ POLYPECTOMY     CORONARY ARTERY BYPASS GRAFT  03-01-1991   Dr. Servando Snare   ESOPHAGOGASTRODUODENOSCOPY (EGD) WITH PROPOFOL N/A 05/04/2018   Procedure: ESOPHAGOGASTRODUODENOSCOPY (EGD) WITH PROPOFOL;  Surgeon: [name], MD;  Location: [facility];  Service: Gastroenterology;  Laterality: N/A;   EYE SURGERY     Detached retina (Left eye); bilateral cataract removal   FALSE  ANEURYSM REPAIR  05/07/2012   Procedure: REPAIR FALSE ANEURYSM;  Surgeon: Rosetta Posner, MD;  Location: [facility] OR;  Service: Vascular;  Laterality: Left;  Repair of Left Femoral Artery Aneurysm   HERNIA REPAIR     HOT HEMOSTASIS N/A 05/04/2018   Procedure: HOT HEMOSTASIS (ARGON PLASMA COAGULATION/BICAP);  Surgeon: [name], MD;  Location: [facility];  Service: Gastroenterology;  Laterality: N/A;   INTRAMEDULLARY (IM) NAIL INTERTROCHANTERIC Left 04/16/2019   Procedure: INTRAMEDULLARY (IM)  NAIL INTERTROCHANTRIC;  Surgeon: Rod Can, MD;  Location: [facility];  Service: Orthopedics;  Laterality: Left;   INTRAMEDULLARY (IM) NAIL INTERTROCHANTERIC Right 06/17/2019   Procedure: RIGHT INTRAMEDULLARY (IM) NAIL INTERTROCHANTRIC;  Surgeon: Rod Can, MD;  Location: [facility];  Service: Orthopedics;  Laterality: Right;   IR NASO G TUBE PLC W/FL W/RAD  12/15/2020   ORIF PATELLA Right 06/09/2015   Procedure: OPEN REDUCTION INTERNAL (ORIF) FIXATION PATELLA;  Surgeon: Marybelle Killings, MD;  Location: [facility];  Service: Orthopedics;  Laterality: Right;   repair of bowel obstruction  1999   Dr. Dalbert Batman   repair of ventral hernia  04-20-1994   P. Cameron Sprang MD   resection femoral artery  03/19/2012   Dr. [name] [place]   [place] INJECTION  05/04/2018   Procedure: SUBMUCOSAL INJECTION;  Surgeon: [name], MD;  Location: [facility];  Service: Gastroenterology;;   TRANSURETHRAL RESECTION OF BLADDER TUMOR N/A 08/18/2019   Procedure: cysto fulgeration clot evacuation;  Surgeon: Cleon Gustin, MD;  Location: [facility];  Service: Urology;  Laterality: N/A;       Family History  Problem Relation Age of Onset   Cancer Mother    Cancer Father    Varicose Veins Father    Cancer Sister    Diabetes Sister    Heart attack Sister     Social History   Tobacco Use   Smoking status: Former    Years: 20.00    Pack years: 0.00    Types: Cigarettes    Quit date: 07/25/1968    Years since quitting: 52.5   Smokeless tobacco: Never  Vaping Use   Vaping Use: Never used  Substance Use Topics   Alcohol use: No   Drug use: No    Home Medications Prior to Admission medications   Medication Sig Start Date End Date Taking? Authorizing Provider  acetaminophen (TYLENOL) 500 MG tablet Take 500 mg by mouth every 6 (six) hours as needed for mild pain or headache.    [provider]  alendronate (FOSAMAX) 70 MG tablet Take 70 mg by mouth every Saturday. Saturday 08/14/19   [provider]  aspirin 81 MG tablet Take 1 tablet (81 mg total) by mouth daily. 08/26/19   Terrilee Croak, MD  bisacodyl (DULCOLAX) 5 MG EC tablet Take 1 tablet (5 mg total) by mouth daily as needed for moderate constipation. 12/25/20 12/25/21  Lavina Hamman, MD  carboxymethylcellulose (REFRESH PLUS) 0.5 % SOLN Apply 2 drops to eye at bedtime.    [provider]  carvedilol (COREG) 3.125 MG tablet TAKE 1 TABLET BY MOUTH 2 TIMES DAILY WITH A MEAL 12/25/20   Lavina Hamman, MD  docusate sodium (COLACE) 100 MG capsule Take 1 capsule (100 mg total) by mouth 2 (two) times daily. 12/25/20 12/25/21  Lavina Hamman, MD  fenofibrate 160 MG tablet Take 1 tablet (160 mg total) by mouth daily. 05/29/19   [name], MD  finasteride (PROSCAR) 5 MG tablet Take 5 mg by mouth  daily. 02/10/20   [provider]  pantoprazole (PROTONIX) 40 MG tablet Take 40 mg by mouth daily.    [provider]  polyethylene glycol (MIRALAX / GLYCOLAX) 17 g packet Take 17 g by mouth daily. 12/26/20   Lavina Hamman, MD  sertraline (ZOLOFT) 50 MG tablet Take 50 mg by mouth daily. 12/04/20   [provider]  simethicone (MYLICON) 80 MG chewable tablet Chew 1 tablet (80 mg total) by mouth 4 (four) times daily as needed for flatulence. 12/25/20   Lavina Hamman, MD  simvastatin (ZOCOR) 40 MG tablet Take 1 tablet (40 mg total) by mouth daily at 6 PM. 05/29/19   [name], MD  sodium chloride (OCEAN) 0.65 % SOLN nasal spray Place 2 sprays into both nostrils 2 (two) times daily.    [provider]  spironolactone (ALDACTONE) 25 MG tablet Take 12.5 mg by mouth daily. 12/04/20   [provider]  tamsulosin (FLOMAX) 0.4 MG CAPS capsule Take 0.4 mg by mouth daily. 02/10/20   [provider]    Allergies    Quinolones and Adhesive [tape]  Review of Systems   Review of Systems  Constitutional:  Positive for activity change.  Respiratory:  Negative for shortness of breath.    Cardiovascular:  Negative for chest pain.  Gastrointestinal:  Positive for abdominal pain.  Genitourinary:  Positive for hematuria.  Hematological:  Does not bruise/bleed easily.  All other systems reviewed and are negative.  Physical Exam Updated Vital Signs BP (!) 137/102   Pulse (!) 104   Temp 97.6 F (36.4 C) (Oral)   Resp (!) 21   Ht 6' (1.829 m)   Wt 79.4 kg   SpO2 100%   BMI 23.73 kg/m   Physical Exam Vitals and nursing note reviewed.  Constitutional:      Appearance: He is well-developed.  HENT:     Head: Atraumatic.  Cardiovascular:     Rate and Rhythm: Normal rate.  Pulmonary:     Effort: Pulmonary effort is normal.  Abdominal:     Tenderness: There is abdominal tenderness. There is no guarding or rebound.  Musculoskeletal:     Cervical back: Neck supple.  Skin:    General: Skin is warm.  Neurological:     Mental Status: He is alert and oriented to person, place, and time.    ED Results / Procedures / Treatments   Labs (all labs ordered are listed, but only abnormal results are displayed) Labs Reviewed  COMPREHENSIVE METABOLIC PANEL - Abnormal; Notable for the following components:      Result Value   CO2 21 (*)    Glucose, Bld 122 (*)    BUN 24 (*)    All other components within normal limits  RESP PANEL BY RT-PCR (FLU A&B, COVID) ARPGX2  URINE CULTURE  SARS CORONAVIRUS 2 (TAT 6-24 HRS)  CBC WITH DIFFERENTIAL/PLATELET  URINALYSIS, ROUTINE W REFLEX MICROSCOPIC    EKG None  Radiology CT Renal Stone Study  Result Date: 01/22/2021 CLINICAL DATA:  Hematuria EXAM: CT ABDOMEN AND PELVIS WITHOUT CONTRAST TECHNIQUE: Multidetector CT imaging of the abdomen and pelvis was performed following the standard protocol without IV contrast. COMPARISON:  12/21/2020 FINDINGS: Lower chest: Mild reticulonodular scarring is noted in the bases bilaterally. Hepatobiliary: Multiple gallstones are noted. No gallbladder wall thickening or pericholecystic fluid is noted.  The liver is within normal limits. Pancreas: Unremarkable. No pancreatic ductal dilatation or surrounding inflammatory changes. Spleen: Normal in size without focal abnormality.  Adrenals/Urinary Tract: Adrenal glands are within normal limits bilaterally. Cysts are seen in the kidneys bilaterally. Increased fullness of the collecting systems is noted bilaterally likely related to compression on the ureter and bladder by internal iliac artery aneurysms. Bladder demonstrates diffuse increased attenuation within consistent with the known history of hematuria. Posterior defect in the hematocrit level is noted along the right posterior bladder wall best seen on image number 72 of series 5. This is of uncertain significance as no definitive mass was noted in this region on prior CT. Stomach/Bowel: The appendix is not well visualized. No inflammatory changes are seen. The colon and small bowel show no obstructive or inflammatory changes. Stomach is distended with fluid without obstructing lesion. Vascular/Lymphatic: Changes are noted consistent with prior abdominal aortic repair. The overall appearance is stable from the prior exam. Delineation of the lumen is difficult given the lack of IV contrast. Internal iliac artery aneurysms are noted bilaterally as well as common femoral artery aneurysms stable in appearance from the prior exam. Reproductive: Fiducial markers are noted in the prostate bed. Other: No abdominal wall hernia or abnormality. No abdominopelvic ascites. Musculoskeletal: Bilateral femur surgery is seen. Degenerative changes of lumbar spine are noted. IMPRESSION: Increased attenuating material in the bladder consistent with the known history of hematuria. A defect is noted in the posterior aspect of the blood/thrombus suspicious for a mass lesion although no thickening was seen on recent CT examination in this region. Cholelithiasis without complicating factors. Bibasilar scarring similar to that seen on  prior CT. Stable abdominal aortic aneurysm repair and bilateral iliac artery aneurysms similar to that seen on the recent exam. Electronically Signed   By: Inez Catalina M.D.   On: 01/22/2021 14:12    Procedures Procedures   Medications Ordered in ED Medications  cefTRIAXone (ROCEPHIN) 2 g in sodium chloride 0.9 % 100 mL IVPB (has no administration in time range)  fentaNYL (SUBLIMAZE) injection 50 mcg (50 mcg Intravenous Given 01/22/21 1157)  fentaNYL (SUBLIMAZE) injection 50 mcg (50 mcg Intravenous Given 01/22/21 1255)  HYDROmorphone (DILAUDID) injection 0.5 mg (0.5 mg Intravenous Given 01/22/21 1357)  belladonna-opium (B&O) suppository 16.2-30 mg (1 suppository Rectal Given 01/22/21 1501)  lidocaine (XYLOCAINE) 2 % jelly 1 application (1 application Urethral Given 01/22/21 1533)    ED Course  I have reviewed the triage vital signs and the nursing notes.  Pertinent labs & imaging results that were available during my care of the patient were reviewed by me and considered in my medical decision making (see chart for details).  Clinical Course as of 01/22/21 1651  Fri Jan 22, 2021  1508 We will order Foley catheter and irrigate the bladder.  Repeat bladder scan does reveal 340 cc in the bladder and patient CT renal stone also shows extended bladder.  Did discuss case with Dr. Gloriann Loan, who will see the patient. [AN]  1650 Urology has seen the patient.  They are irrigating him.  He continues to have gross hematuria.  Patient required manipulating of the Foley catheter, and urology is recommending that we give him 2 g of ceftriaxone and admit him to the hospital for monitoring his hemoglobin and hematuria.  They also want [place] to send UA with cultures. [AN]    Clinical Course User Index [AN] Varney Biles, MD   MDM Rules/Calculators/A&P                          87 year old comes in a chief complaint of  bloody urine and abdominal pain. He has history of prostate cancer.  Clinically, it appears that  he is having bladder spasms leading to significant discomfort.  The spasms are likely because of his hematuria.  Kidney stones also considered in the differential.  Less likely to be cystitis given the acute nature of the presentation and sudden onset of the symptoms.  Ultrasound was ordered.  Patient had 190 cc of urine per our staff.  Pain control initiated.  CT renal stone ordered.  Final Clinical Impression(s) / ED Diagnoses Final diagnoses:  Gross hematuria    Rx / DC Orders ED Discharge Orders     None        Varney Biles, MD 01/22/21 [place], Ashla Murph, MD 01/22/21 1651

## 2021-01-22 NOTE — Consult Note (Addendum)
Urology Consult Note   Requesting Attending Physician:  Varney Biles, MD Service Providing Consult: Urology  Consulting Attending: Link Snuffer, MD   Reason for Consult: Gross hematuria clot retention  HPI: Jesse [name] is seen in consultation for reasons noted above at the request of Varney Biles, MD for evaluation of gross hematuria and urinary retention.  This is a 87 y.o. male with history of Gleason 4+3 prostate cancer status post Lupron therapy and external beam radiation therapy followed by Dr. Milford Cage.  Has a history of gross hematuria for which he was hospitalized in the past for irrigation and for cystoscopic fulguration of vessels in the prostatic urethra.  Has known dystrophic calcifications in the prostatic urethra and bladder neck area.  He also has a past medical history of coronary artery disease on baby aspirin.  He presents due to increasing hematuria over the past couple of days and inability to void.  He is afebrile and hemodynamically stable.  He was in significant discomfort with suprapubic fullness and tenderness.  His hemoglobin is stable at 13.3.  Creatinine 0.94.  CT scan noncontrasted was performed revealing significant clot burden in the bladder.  He denies fevers at home.  He states that usually the gross hematuria resolved spontaneously.   Past Medical History: Past Medical History:  Diagnosis Date   AAA (abdominal aortic aneurysm) ([place])    Arthritis    Cancer ([place]) 2010   prostate ( 40 Txs. of radiation treatments)   Coronary artery disease    Environmental allergies    GERD (gastroesophageal reflux disease)    History of prostate cancer 2010   HOH (hard of hearing)    wears bilateral hearing aids   Hyperlipidemia    Hypertension    Kidney stone 1968   Myocardial infarction ([place]) 1992   Pneumonia    hx of   RBBB     Past Surgical History:  Past Surgical History:  Procedure Laterality Date   ABDOMINAL AORTIC ANEURYSM REPAIR  08-13-1993    Dr Cameron Sprang   CARDIAC CATHETERIZATION     COLONOSCOPY W/ POLYPECTOMY     CORONARY ARTERY BYPASS GRAFT  03-01-1991   Dr. Servando Snare   ESOPHAGOGASTRODUODENOSCOPY (EGD) WITH PROPOFOL N/A 05/04/2018   Procedure: ESOPHAGOGASTRODUODENOSCOPY (EGD) WITH PROPOFOL;  Surgeon: [name], MD;  Location: [facility];  Service: Gastroenterology;  Laterality: N/A;   EYE SURGERY     Detached retina (Left eye); bilateral cataract removal   FALSE ANEURYSM REPAIR  05/07/2012   Procedure: REPAIR FALSE ANEURYSM;  Surgeon: Rosetta Posner, MD;  Location: [facility] OR;  Service: Vascular;  Laterality: Left;  Repair of Left Femoral Artery Aneurysm   HERNIA REPAIR     HOT HEMOSTASIS N/A 05/04/2018   Procedure: HOT HEMOSTASIS (ARGON PLASMA COAGULATION/BICAP);  Surgeon: [name], MD;  Location: [facility];  Service: Gastroenterology;  Laterality: N/A;   INTRAMEDULLARY (IM) NAIL INTERTROCHANTERIC Left 04/16/2019   Procedure: INTRAMEDULLARY (IM) NAIL INTERTROCHANTRIC;  Surgeon: Rod Can, MD;  Location: [facility];  Service: Orthopedics;  Laterality: Left;   INTRAMEDULLARY (IM) NAIL INTERTROCHANTERIC Right 06/17/2019   Procedure: RIGHT INTRAMEDULLARY (IM) NAIL INTERTROCHANTRIC;  Surgeon: Rod Can, MD;  Location: [facility];  Service: Orthopedics;  Laterality: Right;   IR NASO G TUBE PLC W/FL W/RAD  12/15/2020   ORIF PATELLA Right 06/09/2015   Procedure: OPEN REDUCTION INTERNAL (ORIF) FIXATION PATELLA;  Surgeon: Marybelle Killings, MD;  Location: [place];  Service: Orthopedics;  Laterality: Right;   repair of bowel obstruction  1999   Dr. Dalbert Batman   repair of ventral hernia  04-20-1994   P. Cameron Sprang MD   resection femoral artery  03/19/2012   Dr. [name] Early   [place] INJECTION  05/04/2018   Procedure: SUBMUCOSAL INJECTION;  Surgeon: [name], MD;  Location: [facility];  Service: Gastroenterology;;   TRANSURETHRAL RESECTION OF BLADDER TUMOR N/A 08/18/2019   Procedure: cysto fulgeration clot evacuation;   Surgeon: Cleon Gustin, MD;  Location: [facility];  Service: Urology;  Laterality: N/A;    Medication: Current Facility-Administered Medications  Medication Dose Route Frequency Provider Last Rate Last Admin   cefTRIAXone (ROCEPHIN) 2 g in sodium chloride 0.9 % 100 mL IVPB  2 g Intravenous Once Varney Biles, MD       Current Outpatient Medications  Medication Sig Dispense Refill   acetaminophen (TYLENOL) 500 MG tablet Take 500 mg by mouth every 6 (six) hours as needed for mild pain or headache.     alendronate (FOSAMAX) 70 MG tablet Take 70 mg by mouth every Saturday. Saturday     aspirin 81 MG tablet Take 1 tablet (81 mg total) by mouth daily. 30 tablet    bisacodyl (DULCOLAX) 5 MG EC tablet Take 1 tablet (5 mg total) by mouth daily as needed for moderate constipation. 30 tablet 1   carboxymethylcellulose (REFRESH PLUS) 0.5 % SOLN Apply 2 drops to eye at bedtime.     carvedilol (COREG) 3.125 MG tablet TAKE 1 TABLET BY MOUTH 2 TIMES DAILY WITH A MEAL 60 tablet 0   docusate sodium (COLACE) 100 MG capsule Take 1 capsule (100 mg total) by mouth 2 (two) times daily. 60 capsule 2   fenofibrate 160 MG tablet Take 1 tablet (160 mg total) by mouth daily. 90 tablet 1   finasteride (PROSCAR) 5 MG tablet Take 5 mg by mouth daily.     pantoprazole (PROTONIX) 40 MG tablet Take 40 mg by mouth daily.     polyethylene glycol (MIRALAX / GLYCOLAX) 17 g packet Take 17 g by mouth daily. 14 each 0   sertraline (ZOLOFT) 50 MG tablet Take 50 mg by mouth daily.     simethicone (MYLICON) 80 MG chewable tablet Chew 1 tablet (80 mg total) by mouth 4 (four) times daily as needed for flatulence. 30 tablet 0   simvastatin (ZOCOR) 40 MG tablet Take 1 tablet (40 mg total) by mouth daily at 6 PM. 90 tablet 1   sodium chloride (OCEAN) 0.65 % SOLN nasal spray Place 2 sprays into both nostrils 2 (two) times daily.     spironolactone (ALDACTONE) 25 MG tablet Take 12.5 mg by mouth daily.     tamsulosin (FLOMAX) 0.4 MG CAPS  capsule Take 0.4 mg by mouth daily.      Allergies: Allergies  Allergen Reactions   Quinolones Other (See Comments)    unknown   Adhesive [Tape] Itching and Rash    Social History: Social History   Tobacco Use   Smoking status: Former    Years: 20.00    Pack years: 0.00    Types: Cigarettes    Quit date: 07/25/1968    Years since quitting: 52.5   Smokeless tobacco: Never  Vaping Use   Vaping Use: Never used  Substance Use Topics   Alcohol use: No   Drug use: No    Family History Family History  Problem Relation Age of Onset   Cancer Mother    Cancer Father    Varicose Veins Father  Cancer Sister    Diabetes Sister    Heart attack Sister     Review of Systems 10 systems were reviewed and are negative except as noted specifically in the HPI.  Objective   Vital signs in last 24 hours: BP (!) 137/102   Pulse (!) 104   Temp 97.6 F (36.4 C) (Oral)   Resp (!) 21   Ht 6' (1.829 m)   Wt 79.4 kg   SpO2 100%   BMI 23.73 kg/m   Physical Exam General: NAD, A&O, resting, appropriate HEENT: [place]/AT, EOMI, MMM Pulmonary: Normal work of breathing Cardiovascular: HDS, adequate peripheral perfusion Abdomen: Soft, NTTP, suprapubic tenderness initially prior to catheter placement. GU: 24 French coud tip hematuria three-way catheter in place, CBI on moderate drip with watermelon colored output  extremities: warm and well perfused Neuro: Appropriate, no focal neurological deficits  Most Recent Labs: Lab Results  Component Value Date   WBC 8.9 01/22/2021   HGB 13.3 01/22/2021   HCT 41.2 01/22/2021   PLT 326 01/22/2021    Lab Results  Component Value Date   NA 137 01/22/2021   K 4.5 01/22/2021   CL 104 01/22/2021   CO2 21 (L) 01/22/2021   BUN 24 (H) 01/22/2021   CREATININE 0.94 01/22/2021   CALCIUM 10.2 01/22/2021   MG 2.0 12/24/2020   PHOS 3.6 06/24/2019    Lab Results  Component Value Date   INR 1.4 (H) 04/16/2019   APTT 36 04/27/2012     Urine  Culture: @[id number] (laburin,org,[id number])@   IMAGING: CT Renal Stone Study  Result Date: 01/22/2021 CLINICAL DATA:  Hematuria EXAM: CT ABDOMEN AND PELVIS WITHOUT CONTRAST TECHNIQUE: Multidetector CT imaging of the abdomen and pelvis was performed following the standard protocol without IV contrast. COMPARISON:  12/21/2020 FINDINGS: Lower chest: Mild reticulonodular scarring is noted in the bases bilaterally. Hepatobiliary: Multiple gallstones are noted. No gallbladder wall thickening or pericholecystic fluid is noted. The liver is within normal limits. Pancreas: Unremarkable. No pancreatic ductal dilatation or surrounding inflammatory changes. Spleen: Normal in size without focal abnormality. Adrenals/Urinary Tract: Adrenal glands are within normal limits bilaterally. Cysts are seen in the kidneys bilaterally. Increased fullness of the collecting systems is noted bilaterally likely related to compression on the ureter and bladder by internal iliac artery aneurysms. Bladder demonstrates diffuse increased attenuation within consistent with the known history of hematuria. Posterior defect in the hematocrit level is noted along the right posterior bladder wall best seen on image number 72 of series 5. This is of uncertain significance as no definitive mass was noted in this region on prior CT. Stomach/Bowel: The appendix is not well visualized. No inflammatory changes are seen. The colon and small bowel show no obstructive or inflammatory changes. Stomach is distended with fluid without obstructing lesion. Vascular/Lymphatic: Changes are noted consistent with prior abdominal aortic repair. The overall appearance is stable from the prior exam. Delineation of the lumen is difficult given the lack of IV contrast. Internal iliac artery aneurysms are noted bilaterally as well as common femoral artery aneurysms stable in appearance from the prior exam. Reproductive: Fiducial markers are noted in the prostate bed.  Other: No abdominal wall hernia or abnormality. No abdominopelvic ascites. Musculoskeletal: Bilateral femur surgery is seen. Degenerative changes of lumbar spine are noted. IMPRESSION: Increased attenuating material in the bladder consistent with the known history of hematuria. A defect is noted in the posterior aspect of the blood/thrombus suspicious for a mass lesion although no thickening was seen on recent CT  examination in this region. Cholelithiasis without complicating factors. Bibasilar scarring similar to that seen on prior CT. Stable abdominal aortic aneurysm repair and bilateral iliac artery aneurysms similar to that seen on the recent exam. Electronically Signed   By: Inez Catalina M.D.   On: 01/22/2021 14:12    ------  Assessment:  87 y.o. 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  Presents with gross hematuria and urinary retention.  He is afebrile hemodynamically stable.  Hemoglobin stable at 13.  A 24 French hematuria coud tip three-way catheter was placed with 20 cc of sterile water in the balloon.  Manually irrigated extensively with about 4 L of sterile saline and achieved about 400 cc of clot return.  CBI was started and output is watermelon on moderate drip.  Recommendations: -Agree with admission for continuous bladder irrigation hemoglobin monitoring -Please make patient n.p.o. midnight in the event that he requires a procedure.  Urology will reevaluate the patient in the morning. - Serial hemoglobins per primary team - Recommend holding chemical DVT prophylaxis or blood thinning medications at this time in the setting of hematuria - Continue bladder irrigation.  If the catheter becomes obstructed, please stop with the bladder irrigation and nurse should manually irrigate to flush out clots.  Please notify there is  issue irrigating the catheter.  Otherwise recommend weaning the CBI to light pink output. -2 g of empiric ceftriaxone were given in the setting of extensive lower urinary tract manipulation.  Recommend following up urine culture and treating as appropriate.  Of note he does have a history of Enterococcus UTI in the past.  Thank you for this consult. Please contact the urology consult pager with any further questions/concerns.  Bishop Limbo, MD [facility]

## 2021-01-22 NOTE — ED Triage Notes (Signed)
Pt presents to ED via ems cc hematuria. Pt reports bladder spasms for the past 2 days. Pt states hematuria started this am. Pt reports hx of prostate cancer treated with radiation. A&ox4. Respirations equal, unlabored.

## 2021-01-22 NOTE — H&P (View-Only) (Signed)
Urology Consult Note   Requesting Attending Physician:  Varney Biles, MD Service Providing Consult: Urology  Consulting Attending: Link Snuffer, MD   Reason for Consult: Gross hematuria clot retention  HPI: Jesse [name] is seen in consultation for reasons noted above at the request of Varney Biles, MD for evaluation of gross hematuria and urinary retention.  This is a 87 y.o. male with history of Gleason 4+3 prostate cancer status post Lupron therapy and external beam radiation therapy followed by Dr. Milford Cage.  Has a history of gross hematuria for which he was hospitalized in the past for irrigation and for cystoscopic fulguration of vessels in the prostatic urethra.  Has known dystrophic calcifications in the prostatic urethra and bladder neck area.  He also has a past medical history of coronary artery disease on baby aspirin.  He presents due to increasing hematuria over the past couple of days and inability to void.  He is afebrile and hemodynamically stable.  He was in significant discomfort with suprapubic fullness and tenderness.  His hemoglobin is stable at 13.3.  Creatinine 0.94.  CT scan noncontrasted was performed revealing significant clot burden in the bladder.  He denies fevers at home.  He states that usually the gross hematuria resolved spontaneously.   Past Medical History: Past Medical History:  Diagnosis Date   AAA (abdominal aortic aneurysm) ([place])    Arthritis    Cancer ([place]) 2010   prostate ( 40 Txs. of radiation treatments)   Coronary artery disease    Environmental allergies    GERD (gastroesophageal reflux disease)    History of prostate cancer 2010   HOH (hard of hearing)    wears bilateral hearing aids   Hyperlipidemia    Hypertension    Kidney stone 1968   Myocardial infarction ([place]) 1992   Pneumonia    hx of   RBBB     Past Surgical History:  Past Surgical History:  Procedure Laterality Date   ABDOMINAL AORTIC ANEURYSM REPAIR  08-13-1993    Dr Cameron Sprang   CARDIAC CATHETERIZATION     COLONOSCOPY W/ POLYPECTOMY     CORONARY ARTERY BYPASS GRAFT  03-01-1991   Dr. Servando Snare   ESOPHAGOGASTRODUODENOSCOPY (EGD) WITH PROPOFOL N/A 05/04/2018   Procedure: ESOPHAGOGASTRODUODENOSCOPY (EGD) WITH PROPOFOL;  Surgeon: [name], MD;  Location: [facility];  Service: Gastroenterology;  Laterality: N/A;   EYE SURGERY     Detached retina (Left eye); bilateral cataract removal   FALSE ANEURYSM REPAIR  05/07/2012   Procedure: REPAIR FALSE ANEURYSM;  Surgeon: Rosetta Posner, MD;  Location: [facility] OR;  Service: Vascular;  Laterality: Left;  Repair of Left Femoral Artery Aneurysm   HERNIA REPAIR     HOT HEMOSTASIS N/A 05/04/2018   Procedure: HOT HEMOSTASIS (ARGON PLASMA COAGULATION/BICAP);  Surgeon: [name], MD;  Location: [facility];  Service: Gastroenterology;  Laterality: N/A;   INTRAMEDULLARY (IM) NAIL INTERTROCHANTERIC Left 04/16/2019   Procedure: INTRAMEDULLARY (IM) NAIL INTERTROCHANTRIC;  Surgeon: Rod Can, MD;  Location: [facility];  Service: Orthopedics;  Laterality: Left;   INTRAMEDULLARY (IM) NAIL INTERTROCHANTERIC Right 06/17/2019   Procedure: RIGHT INTRAMEDULLARY (IM) NAIL INTERTROCHANTRIC;  Surgeon: Rod Can, MD;  Location: [facility];  Service: Orthopedics;  Laterality: Right;   IR NASO G TUBE PLC W/FL W/RAD  12/15/2020   ORIF PATELLA Right 06/09/2015   Procedure: OPEN REDUCTION INTERNAL (ORIF) FIXATION PATELLA;  Surgeon: Marybelle Killings, MD;  Location: [facility];  Service: Orthopedics;  Laterality: Right;   repair of bowel obstruction  1999   Dr. Dalbert Batman   repair of ventral hernia  04-20-1994   P. Cameron Sprang MD   resection femoral artery  03/19/2012   Dr. [name] Early   [place] INJECTION  05/04/2018   Procedure: SUBMUCOSAL INJECTION;  Surgeon: [name], MD;  Location: [facility];  Service: Gastroenterology;;   TRANSURETHRAL RESECTION OF BLADDER TUMOR N/A 08/18/2019   Procedure: cysto fulgeration clot evacuation;   Surgeon: Cleon Gustin, MD;  Location: [facility];  Service: Urology;  Laterality: N/A;    Medication: Current Facility-Administered Medications  Medication Dose Route Frequency Provider Last Rate Last Admin   cefTRIAXone (ROCEPHIN) 2 g in sodium chloride 0.9 % 100 mL IVPB  2 g Intravenous Once Varney Biles, MD       Current Outpatient Medications  Medication Sig Dispense Refill   acetaminophen (TYLENOL) 500 MG tablet Take 500 mg by mouth every 6 (six) hours as needed for mild pain or headache.     alendronate (FOSAMAX) 70 MG tablet Take 70 mg by mouth every Saturday. Saturday     aspirin 81 MG tablet Take 1 tablet (81 mg total) by mouth daily. 30 tablet    bisacodyl (DULCOLAX) 5 MG EC tablet Take 1 tablet (5 mg total) by mouth daily as needed for moderate constipation. 30 tablet 1   carboxymethylcellulose (REFRESH PLUS) 0.5 % SOLN Apply 2 drops to eye at bedtime.     carvedilol (COREG) 3.125 MG tablet TAKE 1 TABLET BY MOUTH 2 TIMES DAILY WITH A MEAL 60 tablet 0   docusate sodium (COLACE) 100 MG capsule Take 1 capsule (100 mg total) by mouth 2 (two) times daily. 60 capsule 2   fenofibrate 160 MG tablet Take 1 tablet (160 mg total) by mouth daily. 90 tablet 1   finasteride (PROSCAR) 5 MG tablet Take 5 mg by mouth daily.     pantoprazole (PROTONIX) 40 MG tablet Take 40 mg by mouth daily.     polyethylene glycol (MIRALAX / GLYCOLAX) 17 g packet Take 17 g by mouth daily. 14 each 0   sertraline (ZOLOFT) 50 MG tablet Take 50 mg by mouth daily.     simethicone (MYLICON) 80 MG chewable tablet Chew 1 tablet (80 mg total) by mouth 4 (four) times daily as needed for flatulence. 30 tablet 0   simvastatin (ZOCOR) 40 MG tablet Take 1 tablet (40 mg total) by mouth daily at 6 PM. 90 tablet 1   sodium chloride (OCEAN) 0.65 % SOLN nasal spray Place 2 sprays into both nostrils 2 (two) times daily.     spironolactone (ALDACTONE) 25 MG tablet Take 12.5 mg by mouth daily.     tamsulosin (FLOMAX) 0.4 MG CAPS  capsule Take 0.4 mg by mouth daily.      Allergies: Allergies  Allergen Reactions   Quinolones Other (See Comments)    unknown   Adhesive [Tape] Itching and Rash    Social History: Social History   Tobacco Use   Smoking status: Former    Years: 20.00    Pack years: 0.00    Types: Cigarettes    Quit date: 07/25/1968    Years since quitting: 52.5   Smokeless tobacco: Never  Vaping Use   Vaping Use: Never used  Substance Use Topics   Alcohol use: No   Drug use: No    Family History Family History  Problem Relation Age of Onset   Cancer Mother    Cancer Father    Varicose Veins Father  Cancer Sister    Diabetes Sister    Heart attack Sister     Review of Systems 10 systems were reviewed and are negative except as noted specifically in the HPI.  Objective   Vital signs in last 24 hours: BP (!) 137/102   Pulse (!) 104   Temp 97.6 F (36.4 C) (Oral)   Resp (!) 21   Ht 6' (1.829 m)   Wt 79.4 kg   SpO2 100%   BMI 23.73 kg/m   Physical Exam General: NAD, A&O, resting, appropriate HEENT: [place]/AT, EOMI, MMM Pulmonary: Normal work of breathing Cardiovascular: HDS, adequate peripheral perfusion Abdomen: Soft, NTTP, suprapubic tenderness initially prior to catheter placement. GU: 24 French coud tip hematuria three-way catheter in place, CBI on moderate drip with watermelon colored output  extremities: warm and well perfused Neuro: Appropriate, no focal neurological deficits  Most Recent Labs: Lab Results  Component Value Date   WBC 8.9 01/22/2021   HGB 13.3 01/22/2021   HCT 41.2 01/22/2021   PLT 326 01/22/2021    Lab Results  Component Value Date   NA 137 01/22/2021   K 4.5 01/22/2021   CL 104 01/22/2021   CO2 21 (L) 01/22/2021   BUN 24 (H) 01/22/2021   CREATININE 0.94 01/22/2021   CALCIUM 10.2 01/22/2021   MG 2.0 12/24/2020   PHOS 3.6 06/24/2019    Lab Results  Component Value Date   INR 1.4 (H) 04/16/2019   APTT 36 04/27/2012     Urine  Culture: @[id number] (laburin,org,[id number])@   IMAGING: CT Renal Stone Study  Result Date: 01/22/2021 CLINICAL DATA:  Hematuria EXAM: CT ABDOMEN AND PELVIS WITHOUT CONTRAST TECHNIQUE: Multidetector CT imaging of the abdomen and pelvis was performed following the standard protocol without IV contrast. COMPARISON:  12/21/2020 FINDINGS: Lower chest: Mild reticulonodular scarring is noted in the bases bilaterally. Hepatobiliary: Multiple gallstones are noted. No gallbladder wall thickening or pericholecystic fluid is noted. The liver is within normal limits. Pancreas: Unremarkable. No pancreatic ductal dilatation or surrounding inflammatory changes. Spleen: Normal in size without focal abnormality. Adrenals/Urinary Tract: Adrenal glands are within normal limits bilaterally. Cysts are seen in the kidneys bilaterally. Increased fullness of the collecting systems is noted bilaterally likely related to compression on the ureter and bladder by internal iliac artery aneurysms. Bladder demonstrates diffuse increased attenuation within consistent with the known history of hematuria. Posterior defect in the hematocrit level is noted along the right posterior bladder wall best seen on image number 72 of series 5. This is of uncertain significance as no definitive mass was noted in this region on prior CT. Stomach/Bowel: The appendix is not well visualized. No inflammatory changes are seen. The colon and small bowel show no obstructive or inflammatory changes. Stomach is distended with fluid without obstructing lesion. Vascular/Lymphatic: Changes are noted consistent with prior abdominal aortic repair. The overall appearance is stable from the prior exam. Delineation of the lumen is difficult given the lack of IV contrast. Internal iliac artery aneurysms are noted bilaterally as well as common femoral artery aneurysms stable in appearance from the prior exam. Reproductive: Fiducial markers are noted in the prostate bed.  Other: No abdominal wall hernia or abnormality. No abdominopelvic ascites. Musculoskeletal: Bilateral femur surgery is seen. Degenerative changes of lumbar spine are noted. IMPRESSION: Increased attenuating material in the bladder consistent with the known history of hematuria. A defect is noted in the posterior aspect of the blood/thrombus suspicious for a mass lesion although no thickening was seen on recent CT  examination in this region. Cholelithiasis without complicating factors. Bibasilar scarring similar to that seen on prior CT. Stable abdominal aortic aneurysm repair and bilateral iliac artery aneurysms similar to that seen on the recent exam. Electronically Signed   By: Inez Catalina M.D.   On: 01/22/2021 14:12    ------  Assessment:  87 y.o. 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  Presents with gross hematuria and urinary retention.  He is afebrile hemodynamically stable.  Hemoglobin stable at 13.  A 24 French hematuria coud tip three-way catheter was placed with 20 cc of sterile water in the balloon.  Manually irrigated extensively with about 4 L of sterile saline and achieved about 400 cc of clot return.  CBI was started and output is watermelon on moderate drip.  Recommendations: -Agree with admission for continuous bladder irrigation hemoglobin monitoring -Please make patient n.p.o. midnight in the event that he requires a procedure.  Urology will reevaluate the patient in the morning. - Serial hemoglobins per primary team - Recommend holding chemical DVT prophylaxis or blood thinning medications at this time in the setting of hematuria - Continue bladder irrigation.  If the catheter becomes obstructed, please stop with the bladder irrigation and nurse should manually irrigate to flush out clots.  Please notify there is  issue irrigating the catheter.  Otherwise recommend weaning the CBI to light pink output. -2 g of empiric ceftriaxone were given in the setting of extensive lower urinary tract manipulation.  Recommend following up urine culture and treating as appropriate.  Of note he does have a history of Enterococcus UTI in the past.  Thank you for this consult. Please contact the urology consult pager with any further questions/concerns.  Bishop Limbo, MD [facility]

## 2021-01-23 ENCOUNTER — Encounter (HOSPITAL_COMMUNITY)
Admission: EM | Disposition: A | Discharge status: Home Health | Payer: Self-pay | Source: Home / Self Care | Attending: Internal Medicine

## 2021-01-23 ENCOUNTER — Observation Stay (HOSPITAL_COMMUNITY): Payer: Medicare Other | Admitting: Certified Registered"

## 2021-01-23 ENCOUNTER — Encounter (HOSPITAL_COMMUNITY): Payer: Self-pay | Admitting: Internal Medicine

## 2021-01-23 DIAGNOSIS — Z7982 Long term (current) use of aspirin: Secondary | ICD-10-CM | POA: Diagnosis not present

## 2021-01-23 DIAGNOSIS — I714 Abdominal aortic aneurysm, without rupture: Secondary | ICD-10-CM | POA: Diagnosis not present

## 2021-01-23 DIAGNOSIS — K219 Gastro-esophageal reflux disease without esophagitis: Secondary | ICD-10-CM | POA: Diagnosis not present

## 2021-01-23 DIAGNOSIS — H9193 Unspecified hearing loss, bilateral: Secondary | ICD-10-CM | POA: Diagnosis not present

## 2021-01-23 DIAGNOSIS — I4510 Unspecified right bundle-branch block: Secondary | ICD-10-CM | POA: Diagnosis not present

## 2021-01-23 DIAGNOSIS — I252 Old myocardial infarction: Secondary | ICD-10-CM | POA: Diagnosis not present

## 2021-01-23 DIAGNOSIS — R310 Gross hematuria: Secondary | ICD-10-CM | POA: Diagnosis present

## 2021-01-23 DIAGNOSIS — Y633 Inadvertent exposure of patient to radiation during medical care: Secondary | ICD-10-CM | POA: Diagnosis not present

## 2021-01-23 DIAGNOSIS — F419 Anxiety disorder, unspecified: Secondary | ICD-10-CM | POA: Diagnosis not present

## 2021-01-23 DIAGNOSIS — N3041 Irradiation cystitis with hematuria: Secondary | ICD-10-CM | POA: Diagnosis not present

## 2021-01-23 DIAGNOSIS — Z66 Do not resuscitate: Secondary | ICD-10-CM | POA: Diagnosis not present

## 2021-01-23 DIAGNOSIS — Z79899 Other long term (current) drug therapy: Secondary | ICD-10-CM | POA: Diagnosis not present

## 2021-01-23 DIAGNOSIS — I110 Hypertensive heart disease with heart failure: Secondary | ICD-10-CM | POA: Diagnosis not present

## 2021-01-23 DIAGNOSIS — E782 Mixed hyperlipidemia: Secondary | ICD-10-CM | POA: Diagnosis not present

## 2021-01-23 DIAGNOSIS — I5022 Chronic systolic (congestive) heart failure: Secondary | ICD-10-CM | POA: Diagnosis not present

## 2021-01-23 DIAGNOSIS — Z8546 Personal history of malignant neoplasm of prostate: Secondary | ICD-10-CM | POA: Diagnosis not present

## 2021-01-23 DIAGNOSIS — N419 Inflammatory disease of prostate, unspecified: Secondary | ICD-10-CM | POA: Diagnosis not present

## 2021-01-23 DIAGNOSIS — N400 Enlarged prostate without lower urinary tract symptoms: Secondary | ICD-10-CM | POA: Diagnosis not present

## 2021-01-23 DIAGNOSIS — Z8616 Personal history of COVID-19: Secondary | ICD-10-CM | POA: Diagnosis not present

## 2021-01-23 DIAGNOSIS — I2510 Atherosclerotic heart disease of native coronary artery without angina pectoris: Secondary | ICD-10-CM | POA: Diagnosis not present

## 2021-01-23 DIAGNOSIS — N3289 Other specified disorders of bladder: Secondary | ICD-10-CM | POA: Diagnosis not present

## 2021-01-23 DIAGNOSIS — Z974 Presence of external hearing-aid: Secondary | ICD-10-CM | POA: Diagnosis not present

## 2021-01-23 DIAGNOSIS — Z20822 Contact with and (suspected) exposure to covid-19: Secondary | ICD-10-CM | POA: Diagnosis not present

## 2021-01-23 DIAGNOSIS — Z87442 Personal history of urinary calculi: Secondary | ICD-10-CM | POA: Diagnosis not present

## 2021-01-23 DIAGNOSIS — Z7983 Long term (current) use of bisphosphonates: Secondary | ICD-10-CM | POA: Diagnosis not present

## 2021-01-23 DIAGNOSIS — F32A Depression, unspecified: Secondary | ICD-10-CM | POA: Diagnosis not present

## 2021-01-23 HISTORY — PX: CYSTOSCOPY WITH FULGERATION: SHX6638

## 2021-01-23 LAB — BASIC METABOLIC PANEL
Anion gap: 9 (ref 5–15)
BUN: 34 mg/dL — ABNORMAL HIGH (ref 8–23)
CO2: 23 mmol/L (ref 22–32)
Calcium: 9.2 mg/dL (ref 8.9–10.3)
Chloride: 102 mmol/L (ref 98–111)
Creatinine, Ser: 0.96 mg/dL (ref 0.61–1.24)
GFR, Estimated: >60 mL/min (ref >60)
Glucose, Bld: 110 mg/dL — ABNORMAL HIGH (ref 70–99)
Potassium: 3.8 mmol/L (ref 3.5–5.1)
Sodium: 134 mmol/L — ABNORMAL LOW (ref 135–145)

## 2021-01-23 LAB — CBC
HCT: 33 % — ABNORMAL LOW (ref 39.0–52.0)
Hemoglobin: 10.6 g/dL — ABNORMAL LOW (ref 13.0–17.0)
MCH: 29.6 pg (ref 26.0–34.0)
MCHC: 32.1 g/dL (ref 30.0–36.0)
MCV: 92.2 fL (ref 80.0–100.0)
Platelets: 247 10*3/uL (ref 150–400)
RBC: 3.58 MIL/uL — ABNORMAL LOW (ref 4.22–5.81)
RDW: 14.3 % (ref 11.5–15.5)
WBC: 12 10*3/uL — ABNORMAL HIGH (ref 4.0–10.5)
nRBC: 0 % (ref 0.0–0.2)

## 2021-01-23 LAB — SURGICAL PCR SCREEN
MRSA, PCR: NEGATIVE
Staphylococcus aureus: NEGATIVE

## 2021-01-23 SURGERY — CYSTOSCOPY, WITH BLADDER FULGURATION
Anesthesia: General | Site: Bladder

## 2021-01-23 MED ORDER — EPHEDRINE SULFATE-NACL 50-0.9 MG/10ML-% IV SOSY
PREFILLED_SYRINGE | INTRAVENOUS | Status: DC | PRN
  Administered 2021-01-23 (×2): 10 mg via INTRAVENOUS

## 2021-01-23 MED ORDER — ACETAMINOPHEN 325 MG PO TABS: 325.0000 mg | ORAL_TABLET | ORAL | Status: DC | PRN

## 2021-01-23 MED ORDER — LIDOCAINE 2% (20 MG/ML) 5 ML SYRINGE
INTRAMUSCULAR | Status: DC | PRN
  Administered 2021-01-23: 80 mg via INTRAVENOUS

## 2021-01-23 MED ORDER — SODIUM CHLORIDE 0.9 % IV SOLN: INTRAVENOUS | Status: DC | PRN

## 2021-01-23 MED ORDER — LIDOCAINE 2% (20 MG/ML) 5 ML SYRINGE
INTRAMUSCULAR | Status: AC
  Filled 2021-01-23: qty 5

## 2021-01-23 MED ORDER — EPHEDRINE 5 MG/ML INJ
INTRAVENOUS | Status: AC
  Filled 2021-01-23: qty 10

## 2021-01-23 MED ORDER — ONDANSETRON HCL 4 MG/2ML IJ SOLN
INTRAMUSCULAR | Status: AC
  Filled 2021-01-23: qty 2

## 2021-01-23 MED ORDER — ACETAMINOPHEN 160 MG/5ML PO SOLN: 325.0000 mg | ORAL | Status: DC | PRN

## 2021-01-23 MED ORDER — ACETAMINOPHEN 10 MG/ML IV SOLN
1000.0000 mg | Freq: Once | INTRAVENOUS | Status: DC | PRN
  Administered 2021-01-23: 1000 mg via INTRAVENOUS

## 2021-01-23 MED ORDER — AMISULPRIDE (ANTIEMETIC) 5 MG/2ML IV SOLN: 10.0000 mg | Freq: Once | INTRAVENOUS | Status: DC | PRN

## 2021-01-23 MED ORDER — SODIUM CHLORIDE 0.9 % IR SOLN
Status: DC | PRN
  Administered 2021-01-23 (×2): 6000 mL via INTRAVESICAL

## 2021-01-23 MED ORDER — PROPOFOL 10 MG/ML IV BOLUS
INTRAVENOUS | Status: AC
  Filled 2021-01-23: qty 20

## 2021-01-23 MED ORDER — MELATONIN 3 MG PO TABS
3.0000 mg | ORAL_TABLET | Freq: Every day | ORAL | Status: DC
  Administered 2021-01-23 – 2021-01-25 (×4): 3 mg via ORAL
  Filled 2021-01-23 (×4): qty 1

## 2021-01-23 MED ORDER — FENTANYL CITRATE (PF) 100 MCG/2ML IJ SOLN: 25.0000 ug | INTRAMUSCULAR | Status: DC | PRN

## 2021-01-23 MED ORDER — POLYVINYL ALCOHOL 1.4 % OP SOLN
1.0000 [drp] | OPHTHALMIC | Status: DC | PRN
  Administered 2021-01-23 – 2021-01-26 (×2): 1 [drp] via OPHTHALMIC
  Filled 2021-01-23: qty 15

## 2021-01-23 MED ORDER — DEXAMETHASONE SODIUM PHOSPHATE 10 MG/ML IJ SOLN
INTRAMUSCULAR | Status: DC | PRN
  Administered 2021-01-23: 10 mg via INTRAVENOUS

## 2021-01-23 MED ORDER — ACETAMINOPHEN 10 MG/ML IV SOLN
INTRAVENOUS | Status: AC
  Filled 2021-01-23: qty 100

## 2021-01-23 MED ORDER — DEXAMETHASONE SODIUM PHOSPHATE 10 MG/ML IJ SOLN
INTRAMUSCULAR | Status: AC
  Filled 2021-01-23: qty 1

## 2021-01-23 MED ORDER — CHLORHEXIDINE GLUCONATE CLOTH 2 % EX PADS
6.0000 | MEDICATED_PAD | Freq: Every day | CUTANEOUS | Status: DC
  Administered 2021-01-23 – 2021-01-25 (×3): 6 via TOPICAL

## 2021-01-23 MED ORDER — FENTANYL CITRATE (PF) 100 MCG/2ML IJ SOLN
INTRAMUSCULAR | Status: AC
  Filled 2021-01-23: qty 2

## 2021-01-23 MED ORDER — SODIUM CHLORIDE 0.9 % IR SOLN
3000.0000 mL | Status: DC
  Administered 2021-01-23: 3000 mL

## 2021-01-23 MED ORDER — PHENAZOPYRIDINE HCL 200 MG PO TABS
200.0000 mg | ORAL_TABLET | Freq: Three times a day (TID) | ORAL | Status: AC
  Administered 2021-01-23: 200 mg via ORAL
  Filled 2021-01-23 (×3): qty 1

## 2021-01-23 MED ORDER — HYOSCYAMINE SULFATE 0.125 MG/5ML PO ELIX
0.2500 mg | ORAL_SOLUTION | Freq: Once | ORAL | Status: AC
  Administered 2021-01-23: 0.25 mg via ORAL
  Filled 2021-01-23: qty 10

## 2021-01-23 MED ORDER — FENTANYL CITRATE (PF) 100 MCG/2ML IJ SOLN
INTRAMUSCULAR | Status: DC | PRN
  Administered 2021-01-23 (×4): 25 ug via INTRAVENOUS

## 2021-01-23 MED ORDER — FENTANYL CITRATE (PF) 100 MCG/2ML IJ SOLN
INTRAMUSCULAR | Status: AC
  Administered 2021-01-23: 50 ug via INTRAVENOUS
  Filled 2021-01-23: qty 2

## 2021-01-23 MED ORDER — ONDANSETRON HCL 4 MG/2ML IJ SOLN
INTRAMUSCULAR | Status: DC | PRN
  Administered 2021-01-23: 4 mg via INTRAVENOUS

## 2021-01-23 MED ORDER — PROPOFOL 10 MG/ML IV BOLUS
INTRAVENOUS | Status: DC | PRN
  Administered 2021-01-23: 30 mg via INTRAVENOUS
  Administered 2021-01-23: 120 mg via INTRAVENOUS

## 2021-01-23 SURGICAL SUPPLY — 19 items
BAG URINE DRAIN 2000ML AR STRL (UROLOGICAL SUPPLIES) IMPLANT
BAG URO CATCHER STRL LF (MISCELLANEOUS) ×2 IMPLANT
CATH FOLEY 2WAY SLVR  5CC 18FR (CATHETERS)
CATH FOLEY 2WAY SLVR 5CC 18FR (CATHETERS) IMPLANT
CATH FOLEY 3WAY 30CC 24FR (CATHETERS) ×2
CATH URTH STD 24FR FL 3W 2 (CATHETERS) ×1 IMPLANT
DRAPE FOOT SWITCH (DRAPES) ×2 IMPLANT
ELECT REM PT RETURN 15FT ADLT (MISCELLANEOUS) ×2 IMPLANT
GLOVE SURG ENC MOIS LTX SZ7.5 (GLOVE) ×2 IMPLANT
GOWN STRL REUS W/TWL XL LVL3 (GOWN DISPOSABLE) ×2 IMPLANT
KIT TURNOVER KIT A (KITS) ×2 IMPLANT
LOOP CUT BIPOLAR 24F LRG (ELECTROSURGICAL) ×2 IMPLANT
MANIFOLD NEPTUNE II (INSTRUMENTS) ×2 IMPLANT
PACK CYSTO (CUSTOM PROCEDURE TRAY) ×2 IMPLANT
PLUG CATH AND CAP STER (CATHETERS) IMPLANT
SYR TOOMEY IRRIG 70ML (MISCELLANEOUS)
SYRINGE TOOMEY IRRIG 70ML (MISCELLANEOUS) IMPLANT
TUBING CONNECTING 10 (TUBING) ×2 IMPLANT
TUBING UROLOGY SET (TUBING) ×2 IMPLANT

## 2021-01-23 NOTE — Transfer of Care (Signed)
Immediate Anesthesia Transfer of Care Note  Patient: Jesse [name]  Procedure(s) Performed: CYSTOSCOPY WITH FULGERATION (Bladder)  Patient Location: PACU  Anesthesia Type:General  Level of Consciousness: awake and alert   Airway & Oxygen Therapy: Patient Spontanous Breathing and Patient connected to face mask oxygen  Post-op Assessment: Report given to RN and Post -op Vital signs reviewed and stable  Post vital signs: Reviewed and stable  Last Vitals:  Vitals Value Taken Time  BP 142/83 01/23/21 1145  Temp    Pulse 73 01/23/21 1148  Resp 15 01/23/21 1148  SpO2 100 % 01/23/21 1148  Vitals shown include unvalidated device data.  Last Pain:  Vitals:   01/23/21 0839  TempSrc:   PainSc: 0-No pain      Patients Stated Pain Goal: 0 (27/78/24 2353)  Complications: No notable events documented.

## 2021-01-23 NOTE — Op Note (Addendum)
Operative Note  Preoperative diagnosis:  1.  Gross hematuria  Postoperative diagnosis: 1.  Gross hematuria secondary to radiation cystitis/ prostatitis  Procedure(s): 1.  Cystoscopy with clot evacuation and fulguration  Surgeon: Link Snuffer, MD  Assistants: None  Anesthesia: General  Complications: None immediate  EBL: Minimal  Specimens: 1.  None  Drains/Catheters: 1.  24 French three-way catheter  Intraoperative findings: 1.  Normal anterior urethra 2.  Evidence of prior resection.  Open prostate.  There was friable and active bleeding at the bladder neck and prostate area.  Bladder mucosa had some trabeculation and some erythema.  There was especially some erythema at the trigone.  Bilateral ureteral orifices were identified and well away from the area of fulguration.  No lesions in the bladder.  No stones.  No areas other than the trigone that needed fulguration.  Indication: 87 year old male with a history of prostate cancer status post external radiation in the past.  He has a history of radiation cystitis that required prior cystoscopy with fulguration of the prostatic urethra.  He presented again with hematuria with clots.  Had recurrent problems with clotting overnight and persistent bleeding.  Therefore, he presents for the above operation.  Description of procedure:  The patient was identified and consent was obtained.  The patient was taken to the operating room and placed in the supine position.  The patient was placed under general anesthesia.  Perioperative antibiotics were administered.  The patient was placed in dorsal lithotomy.  Patient was prepped and draped in a standard sterile fashion and a timeout was performed.  A 26 French resectoscope with visual obturator in place was advanced into the urethra and into the bladder.  All clot was evacuated.  I exchanged for the bipolar working element.  I inspected the entire bladder mucosa and there were no mucosal  masses.  There was some hypervascularity of the trigone in the middle.  Bilateral ureteral orifices were normal.  I fulgurated the hypervascularity area staying well away from the ureteral orifices.  There were no areas other than that in the bladder to fulgurate.  I then inspected the bladder neck and prostatic urethra.  There was active bleeding from this area and this was fulgurated.  There was no active bleeding after fulguration.  I drained the bladder and then reinspected the bladder mucosa once more and there was no active bleeding.  I drained the bladder and withdrew the scope.  66 French three-way catheter was placed and CBI initiated.  Patient tolerated the procedure well and was stable postoperative.  Plan: Continue to monitor hemoglobin and hopefully we will be able to wean him off CBI in the next day.  Voiding trial as soon as we can to prevent catheter irritation of his radiation cystitis.

## 2021-01-23 NOTE — Anesthesia Preprocedure Evaluation (Addendum)
Anesthesia Evaluation  Patient identified by MRN, date of birth, ID band Patient awake    Reviewed: Allergy & Precautions, NPO status , Patient's Chart, lab work & pertinent test results  Airway Mallampati: I  TM Distance: >3 FB Neck ROM: Full    Dental  (+) Teeth Intact, Dental Advisory Given   Pulmonary former smoker,    breath sounds clear to auscultation       Cardiovascular hypertension, Pt. on home beta blockers + CAD, + Past MI, + CABG and + Peripheral Vascular Disease  + dysrhythmias  Rhythm:Regular Rate:Normal     Neuro/Psych Anxiety    GI/Hepatic Neg liver ROS, PUD, GERD  Medicated,  Endo/Other  negative endocrine ROS  Renal/GU      Musculoskeletal  (+) Arthritis ,   Abdominal Normal abdominal exam  (+)   Peds  Hematology negative hematology ROS (+)   Anesthesia Other Findings   Reproductive/Obstetrics                            Anesthesia Physical Anesthesia Plan  ASA: 3 and emergent  Anesthesia Plan: General   Post-op Pain Management:    Induction: Intravenous  PONV Risk Score and Plan: 3 and Ondansetron and Treatment may vary due to age or medical condition  Airway Management Planned: Oral ETT and LMA  Additional Equipment: None  Intra-op Plan:   Post-operative Plan: Extubation in OR  Informed Consent: I have reviewed the patients History and Physical, chart, labs and discussed the procedure including the risks, benefits and alternatives for the proposed anesthesia with the patient or authorized representative who has indicated his/her understanding and acceptance.   Patient has DNR.  Discussed DNR with patient and Suspend DNR.   Dental advisory given  Plan Discussed with: CRNA  Anesthesia Plan Comments: (Echo:  - Left ventricle: The cavity size was normal. Wall thickness was  normal. Systolic function was mildly to moderately reduced. The  estimated  ejection fraction was in the range of 40% to 45%. Wall  motion was normal; there were no regional wall motion  abnormalities. There was an increased relative contribution of  atrial contraction to ventricular filling. Doppler parameters are  consistent with abnormal left ventricular relaxation (grade 1  diastolic dysfunction). Doppler parameters are consistent with  elevated ventricular end-diastolic filling pressure.  - Ventricular septum: Septal motion showed paradox.  - Mitral valve: Calcified annulus.  - Left atrium: The atrium was moderately dilated. )      Anesthesia Quick Evaluation

## 2021-01-23 NOTE — Plan of Care (Signed)

## 2021-01-23 NOTE — Anesthesia Procedure Notes (Signed)
Procedure Name: LMA Insertion Date/Time: 01/23/2021 10:55 AM Performed by: Shenea Giacobbe D, CRNA Pre-anesthesia Checklist: Patient identified, Emergency Drugs available, Suction available and Patient being monitored Patient Re-evaluated:Patient Re-evaluated prior to induction Oxygen Delivery Method: Circle system utilized Preoxygenation: Pre-oxygenation with 100% oxygen Induction Type: IV induction Ventilation: Mask ventilation without difficulty LMA: LMA inserted LMA Size: 5.0 Tube type: Oral Number of attempts: 1 Placement Confirmation: positive ETCO2 and breath sounds checked- equal and bilateral Tube secured with: Tape Dental Injury: Teeth and Oropharynx as per pre-operative assessment

## 2021-01-23 NOTE — Interval H&P Note (Signed)
History and Physical Interval Note:  01/23/2021 10:36 AM  Jesse [name]  has presented today for surgery, with the diagnosis of GROSS HEMATURIA.  The various methods of treatment have been discussed with the patient and family. After consideration of risks, benefits and other options for treatment, the patient has consented to  Procedure(s): [place] (N/A) as a surgical intervention.  The patient's history has been reviewed, patient examined, no change in status, stable for surgery.  I have reviewed the patient's chart and labs.  Questions were answered to the patient's satisfaction.     Marton Redwood, III

## 2021-01-23 NOTE — Anesthesia Postprocedure Evaluation (Signed)
Anesthesia Post Note  Patient: [name]  Procedure(s) Performed: CYSTOSCOPY WITH FULGERATION (Bladder)     Patient location during evaluation: PACU Anesthesia Type: General Level of consciousness: awake and alert Pain management: pain level controlled Vital Signs Assessment: post-procedure vital signs reviewed and stable Respiratory status: spontaneous breathing, nonlabored ventilation, respiratory function stable and patient connected to nasal cannula oxygen Cardiovascular status: blood pressure returned to baseline and stable Postop Assessment: no apparent nausea or vomiting Anesthetic complications: no   No notable events documented.  Last Vitals:  Vitals:   01/23/21 1249 01/23/21 1308  BP: 131/78 126/78  Pulse: 66 66  Resp: 18 16  Temp: 36.6 C (!) 36.4 C  SpO2: 100% 100%    Last Pain:  Vitals:   01/23/21 1308  TempSrc: Oral  PainSc:                  Effie Berkshire

## 2021-01-23 NOTE — Progress Notes (Signed)
PROGRESS NOTE    Jesse [name]  HAL:[id number] DOB: 1928-04-02 DOA: 01/22/2021 PCP: [name], MD     Brief Narrative:  Jesse [name] is a 87 y.o. male with medical history significant of AAA, prostate cancer, coronary artery disease, GERD, hyperlipidemia, hypertension who presented to the hospital with severe abdominal pain and bloody urine.  He was in his usual state of health, had some chills overnight, then woke up this morning with abdominal pain.  He was also passing bright red blood in his urine.  He denies any other symptoms, no chest pain or shortness of breath, no nausea or vomiting.   ED Course: Foley catheter was placed and bladder was irrigated.  CT renal stone also showed extended bladder.  Urology came to see the patient in the emergency department, recommend irrigation, ceftriaxone and monitoring.  New events last 24 hours / Subjective: Had some blood clots passed this morning.  Urology planning to take patient for cystoscopy with fulgeration this morning.  Assessment & Plan:   Principal Problem:   Gross hematuria Active Problems:   Mixed hyperlipidemia   Essential hypertension   Anxiety   History of prostate cancer   Hematuria   Gross hematuria -CT renal stone: Increased attenuating material in the bladder consistent with the known history of hematuria. A defect is noted in the posterior aspect of the blood/thrombus suspicious for a mass lesion although no thickening was seen on recent CT examination in this region. -Appreciate urology -Ceftriaxone  -Hold aspirin for now -Cystoscopy today   Hypertension -Continue Coreg, Aldactone  BPH -Continue Proscar, Flomax  GERD -Continue Protonix  Depression/anxiety -Continue Zoloft  Hyperlipidemia -Continue Zocor   DVT prophylaxis:  SCDs Start: 01/22/21 2333  Code Status:     Code Status Orders  (From admission, onward)           Start     Ordered   01/22/21 2333  Do not attempt resuscitation  (DNR)  Continuous       Question Answer Comment  In the event of cardiac or respiratory ARREST Do not call a "code blue"   In the event of cardiac or respiratory ARREST Do not perform Intubation, CPR, defibrillation or ACLS   In the event of cardiac or respiratory ARREST Use medication by any route, position, wound care, and other measures to relive pain and suffering. May use oxygen, suction and manual treatment of airway obstruction as needed for comfort.      01/22/21 2332           Code Status History     Date Active Date Inactive Code Status Order ID Comments User Context   12/13/2020 1307 12/25/2020 2128 DNR [id number]  Pershing Proud, NP Inpatient   12/12/2020 0804 12/13/2020 1307 Full Code [id number]  [name], MD ED   08/15/2019 1553 08/21/2019 1908 DNR [id number]  Karmen Bongo, MD ED   06/15/2019 2308 06/24/2019 1607 DNR [id number]  Shela Leff, MD ED   06/15/2019 2308 06/15/2019 2308 Full Code [id number]  Shela Leff, MD ED   04/15/2019 1809 04/20/2019 1700 DNR [id number]  Georgette Shell, MD ED   04/15/2019 1741 04/15/2019 1809 Full Code [id number]  Georgette Shell, MD ED   05/04/2018 1443 05/08/2018 1621 DNR [id number]  Elodia Florence., MD Inpatient   06/09/2015 1037 06/11/2015 1711 Full Code [id number]  Lanae Crumbly, PA-C Inpatient   05/07/2012 1400 05/09/2012 1556 Full Code [id number]  Wofford, Donnamae Jude, RN Inpatient  03/19/2012 1157 03/21/2012 1901 Full Code [id number]  Johnsie Cancel, RN Inpatient      Advance Directive Documentation    Flowsheet Row Most Recent Value  Type of Advance Directive Healthcare Power of [place], Living will, Out of facility DNR (pink MOST or yellow form)  Pre-existing out of facility DNR order (yellow form or pink MOST form) Pink MOST/Yellow Form most recent copy in chart - Physician notified to receive inpatient order  "MOST" Form in Place? --      Family Communication: No family at bedside Disposition Plan:   Status is: Inpatient  Remains inpatient appropriate because:Inpatient level of care appropriate due to severity of illness  Dispo: The patient is from: Home              Anticipated d/c is to: Home              Patient currently is not medically stable to d/c.   Difficult to place patient No      Consultants:  Urology  Antimicrobials:  Anti-infectives (From admission, onward)    Start     Dose/Rate Route Frequency Ordered Stop   01/22/21 1700  cefTRIAXone (ROCEPHIN) 2 g in sodium chloride 0.9 % 100 mL IVPB        2 g 200 mL/hr over 30 Minutes Intravenous  Once 01/22/21 1645 01/22/21 1915        Objective: Vitals:   01/23/21 1145 01/23/21 1157 01/23/21 1200 01/23/21 1215  BP: (!) 142/83  (!) 144/81 (!) 148/86  Pulse: 74 72 71 69  Resp: 16 16 18 15   Temp: (!) 97 F (36.1 C)     TempSrc:      SpO2: 100% 100% 100% 100%  Weight:      Height:        Intake/Output Summary (Last 24 hours) at 01/23/2021 1228 Last data filed at 01/23/2021 1146 Gross per 24 hour  Intake 2720 ml  Output 3600 ml  Net -880 ml   Filed Weights   01/22/21 1013  Weight: 79.4 kg    Examination:  General exam: Appears calm and comfortable  Respiratory system: Clear to auscultation. Respiratory effort normal. No respiratory distress. No conversational dyspnea.  Cardiovascular system: S1 & S2 heard, RRR. No murmurs. No pedal edema. Gastrointestinal system: Abdomen is nondistended, soft and nontender. Normal bowel sounds heard. Central nervous system: Alert and oriented. No focal neurological deficits. Speech clear.  Extremities: Symmetric in appearance  Skin: No rashes, lesions or ulcers on exposed skin  Psychiatry: Judgement and insight appear normal. Mood & affect appropriate.   Data Reviewed: I have personally reviewed following labs and imaging studies  CBC: Recent Labs  Lab 01/22/21 1152 01/23/21 0531  WBC 8.9 12.0*  NEUTROABS 6.8  --   HGB 13.3 10.6*  HCT 41.2 33.0*  MCV 91.8  92.2  PLT 326 702   Basic Metabolic Panel: Recent Labs  Lab 01/22/21 1152 01/23/21 0531  NA 137 134*  K 4.5 3.8  CL 104 102  CO2 21* 23  GLUCOSE 122* 110*  BUN 24* 34*  CREATININE 0.94 0.96  CALCIUM 10.2 9.2   GFR: Estimated Creatinine Clearance: 56.1 mL/min (by C-G formula based on SCr of 0.96 mg/dL). Liver Function Tests: Recent Labs  Lab 01/22/21 1152  AST 28  ALT 28  ALKPHOS 57  BILITOT 0.9  PROT 7.3  ALBUMIN 4.2   No results for input(s): LIPASE, AMYLASE in the last 168 hours. No results for input(s): AMMONIA in  the last 168 hours. Coagulation Profile: No results for input(s): INR, PROTIME in the last 168 hours. Cardiac Enzymes: No results for input(s): CKTOTAL, CKMB, CKMBINDEX, TROPONINI in the last 168 hours. BNP (last 3 results) No results for input(s): PROBNP in the last 8760 hours. HbA1C: No results for input(s): HGBA1C in the last 72 hours. CBG: No results for input(s): GLUCAP in the last 168 hours. Lipid Profile: No results for input(s): CHOL, HDL, LDLCALC, TRIG, CHOLHDL, LDLDIRECT in the last 72 hours. Thyroid Function Tests: No results for input(s): TSH, T4TOTAL, FREET4, T3FREE, THYROIDAB in the last 72 hours. Anemia Panel: No results for input(s): VITAMINB12, FOLATE, FERRITIN, TIBC, IRON, RETICCTPCT in the last 72 hours. Sepsis Labs: No results for input(s): PROCALCITON, LATICACIDVEN in the last 168 hours.  Recent Results (from the past 240 hour(s))  Resp Panel by RT-PCR (Flu A&B, Covid) Nasopharyngeal Swab     Status: None   Collection Time: 01/22/21  5:08 PM   Specimen: Nasopharyngeal Swab; Nasopharyngeal(NP) swabs in vial transport medium  Result Value Ref Range Status   SARS Coronavirus 2 by RT PCR NEGATIVE NEGATIVE Final    Comment: (NOTE) SARS-CoV-2 target nucleic acids are NOT DETECTED.  The SARS-CoV-2 RNA is generally detectable in upper respiratory specimens during the acute phase of infection. The lowest concentration of  SARS-CoV-2 viral copies this assay can detect is 138 copies/mL. A negative result does not preclude SARS-Cov-2 infection and should not be used as the sole basis for treatment or other patient management decisions. A negative result may occur with  improper specimen collection/handling, submission of specimen other than nasopharyngeal swab, presence of viral mutation(s) within the areas targeted by this assay, and inadequate number of viral copies(<138 copies/mL). A negative result must be combined with clinical observations, patient history, and epidemiological information. The expected result is Negative.  Fact Sheet for Patients:  EntrepreneurPulse.com.au  Fact Sheet for Healthcare Providers:  IncredibleEmployment.be  This test is no t yet approved or cleared by the [place] FDA and  has been authorized for detection and/or diagnosis of SARS-CoV-2 by FDA under an Emergency Use Authorization (EUA). This EUA will remain  in effect (meaning this test can be used) for the duration of the COVID-19 declaration under Section 564(b)(1) of the Act, 21 U.S.C.section 360bbb-3(b)(1), unless the authorization is terminated  or revoked sooner.       Influenza A by PCR NEGATIVE NEGATIVE Final   Influenza B by PCR NEGATIVE NEGATIVE Final    Comment: (NOTE) The Xpert Xpress SARS-CoV-2/FLU/RSV plus assay is intended as an aid in the diagnosis of influenza from Nasopharyngeal swab specimens and should not be used as a sole basis for treatment. Nasal washings and aspirates are unacceptable for Xpert Xpress SARS-CoV-2/FLU/RSV testing.  Fact Sheet for Patients: EntrepreneurPulse.com.au  Fact Sheet for Healthcare Providers: IncredibleEmployment.be  This test is not yet approved or cleared by the [place] FDA and has been authorized for detection and/or diagnosis of SARS-CoV-2 by FDA under an Emergency Use  Authorization (EUA). This EUA will remain in effect (meaning this test can be used) for the duration of the COVID-19 declaration under Section 564(b)(1) of the Act, 21 U.S.C. section 360bbb-3(b)(1), unless the authorization is terminated or revoked.  Performed at [facility], [place] [address], [place], [place] [address]   Surgical pcr screen     Status: None   Collection Time: 01/23/21  9:47 AM   Specimen: Nasal Mucosa; Nasal Swab  Result Value Ref Range Status  MRSA, PCR NEGATIVE NEGATIVE Final   Staphylococcus aureus NEGATIVE NEGATIVE Final    Comment: (NOTE) The Xpert SA Assay (FDA approved for NASAL specimens in patients 81 years of age and older), is one component of a comprehensive surveillance program. It is not intended to diagnose infection nor to guide or monitor treatment. Performed at [facility], [place] [address] [place], [place] [address]       Radiology Studies: CT Renal Stone Study  Result Date: 01/22/2021 CLINICAL DATA:  Hematuria EXAM: CT ABDOMEN AND PELVIS WITHOUT CONTRAST TECHNIQUE: Multidetector CT imaging of the abdomen and pelvis was performed following the standard protocol without IV contrast. COMPARISON:  12/21/2020 FINDINGS: Lower chest: Mild reticulonodular scarring is noted in the bases bilaterally. Hepatobiliary: Multiple gallstones are noted. No gallbladder wall thickening or pericholecystic fluid is noted. The liver is within normal limits. Pancreas: Unremarkable. No pancreatic ductal dilatation or surrounding inflammatory changes. Spleen: Normal in size without focal abnormality. Adrenals/Urinary Tract: Adrenal glands are within normal limits bilaterally. Cysts are seen in the kidneys bilaterally. Increased fullness of the collecting systems is noted bilaterally likely related to compression on the ureter and bladder by internal iliac artery aneurysms. Bladder demonstrates diffuse increased attenuation within  consistent with the known history of hematuria. Posterior defect in the hematocrit level is noted along the right posterior bladder wall best seen on image number 72 of series 5. This is of uncertain significance as no definitive mass was noted in this region on prior CT. Stomach/Bowel: The appendix is not well visualized. No inflammatory changes are seen. The colon and small bowel show no obstructive or inflammatory changes. Stomach is distended with fluid without obstructing lesion. Vascular/Lymphatic: Changes are noted consistent with prior abdominal aortic repair. The overall appearance is stable from the prior exam. Delineation of the lumen is difficult given the lack of IV contrast. Internal iliac artery aneurysms are noted bilaterally as well as common femoral artery aneurysms stable in appearance from the prior exam. Reproductive: Fiducial markers are noted in the prostate bed. Other: No abdominal wall hernia or abnormality. No abdominopelvic ascites. Musculoskeletal: Bilateral femur surgery is seen. Degenerative changes of lumbar spine are noted. IMPRESSION: Increased attenuating material in the bladder consistent with the known history of hematuria. A defect is noted in the posterior aspect of the blood/thrombus suspicious for a mass lesion although no thickening was seen on recent CT examination in this region. Cholelithiasis without complicating factors. Bibasilar scarring similar to that seen on prior CT. Stable abdominal aortic aneurysm repair and bilateral iliac artery aneurysms similar to that seen on the recent exam. Electronically Signed   By: Inez Catalina M.D.   On: 01/22/2021 14:12      Scheduled Meds:  [MAR Hold] carvedilol  3.125 mg Oral BID WC   [MAR Hold] Chlorhexidine Gluconate Cloth  6 each Topical Daily   [MAR Hold] finasteride  5 mg Oral Daily   [MAR Hold] melatonin  3 mg Oral QHS   [MAR Hold] pantoprazole  40 mg Oral Daily   [MAR Hold] phenazopyridine  200 mg Oral TID WC    [MAR Hold] sertraline  50 mg Oral Daily   [MAR Hold] simvastatin  40 mg Oral q1800   [MAR Hold] sodium chloride flush  3 mL Intravenous Q12H   [MAR Hold] spironolactone  12.5 mg Oral Daily   [MAR Hold] tamsulosin  0.4 mg Oral Daily   Continuous Infusions:  [MAR Hold] sodium chloride     acetaminophen  acetaminophen 1,000 mg (01/23/21 1154)   sodium chloride irrigation       LOS: 0 days      Time spent: 25 minutes   Dessa Phi, DO [facility] 01/23/2021, 12:28 PM   Available via Epic secure chat 7am-7pm After these hours, please refer to coverage provider listed on [email or web address]

## 2021-01-24 ENCOUNTER — Encounter (HOSPITAL_COMMUNITY): Payer: Self-pay | Admitting: Internal Medicine

## 2021-01-24 DIAGNOSIS — R310 Gross hematuria: Secondary | ICD-10-CM | POA: Diagnosis not present

## 2021-01-24 DIAGNOSIS — N3041 Irradiation cystitis with hematuria: Secondary | ICD-10-CM | POA: Diagnosis not present

## 2021-01-24 LAB — CBC
HCT: 32 % — ABNORMAL LOW (ref 39.0–52.0)
Hemoglobin: 10.2 g/dL — ABNORMAL LOW (ref 13.0–17.0)
MCH: 29.8 pg (ref 26.0–34.0)
MCHC: 31.9 g/dL (ref 30.0–36.0)
MCV: 93.6 fL (ref 80.0–100.0)
Platelets: 252 10*3/uL (ref 150–400)
RBC: 3.42 MIL/uL — ABNORMAL LOW (ref 4.22–5.81)
RDW: 14.2 % (ref 11.5–15.5)
WBC: 13.3 10*3/uL — ABNORMAL HIGH (ref 4.0–10.5)
nRBC: 0 % (ref 0.0–0.2)

## 2021-01-24 LAB — BASIC METABOLIC PANEL
Anion gap: 6 (ref 5–15)
BUN: 33 mg/dL — ABNORMAL HIGH (ref 8–23)
CO2: 26 mmol/L (ref 22–32)
Calcium: 9.4 mg/dL (ref 8.9–10.3)
Chloride: 103 mmol/L (ref 98–111)
Creatinine, Ser: 0.77 mg/dL (ref 0.61–1.24)
GFR, Estimated: >60 mL/min (ref >60)
Glucose, Bld: 147 mg/dL — ABNORMAL HIGH (ref 70–99)
Potassium: 4.2 mmol/L (ref 3.5–5.1)
Sodium: 135 mmol/L (ref 135–145)

## 2021-01-24 LAB — URINE CULTURE: Culture: NO GROWTH

## 2021-01-24 MED ORDER — NITROFURANTOIN MONOHYD MACRO 100 MG PO CAPS: 100.0000 mg | ORAL_CAPSULE | Freq: Every day | ORAL | 0 refills | Status: AC

## 2021-01-24 NOTE — Progress Notes (Addendum)
PROGRESS NOTE    Jesse [name]  CBJ:[id number] DOB: [birth year] DOA: 01/22/2021 PCP: [name], MD     Brief Narrative:  Jesse [name] is a 87 y.o. male with medical history significant of AAA, prostate cancer, coronary artery disease, GERD, hyperlipidemia, hypertension who presented to the hospital with severe abdominal pain and bloody urine.  He was in his usual state of health, had some chills overnight, then woke up this morning with abdominal pain.  He was also passing bright red blood in his urine.  He denies any other symptoms, no chest pain or shortness of breath, no nausea or vomiting.   ED Course: Foley catheter was placed and bladder was irrigated.  CT renal stone also showed extended bladder.  Urology came to see the patient in the emergency department, recommend irrigation, ceftriaxone and monitoring.  Patient underwent cystoscopy with clot evacuation and fulguration 7/2.  New events last 24 hours / Subjective: Patient without any new complaints today  Assessment & Plan:   Principal Problem:   Gross hematuria Active Problems:   Mixed hyperlipidemia   Essential hypertension   Anxiety   History of prostate cancer   Hematuria   Gross hematuria -CT renal stone: Increased attenuating material in the bladder consistent with the known history of hematuria. A defect is noted in the posterior aspect of the blood/thrombus suspicious for a mass lesion although no thickening was seen on recent CT examination in this region. -Status post cystoscopy with clot evacuation and fulguration 7/2 -Urine culture negative. S/p rocephin  -Urology following   Hypertension -Continue Coreg, Aldactone  BPH -Continue Proscar, Flomax  GERD -Continue Protonix  Depression/anxiety -Continue Zoloft  Hyperlipidemia -Continue Zocor   DVT prophylaxis:  SCDs Start: 01/22/21 2333  Code Status:     Code Status Orders  (From admission, onward)           Start     Ordered   01/22/21  2333  Do not attempt resuscitation (DNR)  Continuous       Question Answer Comment  In the event of cardiac or respiratory ARREST Do not call a "code blue"   In the event of cardiac or respiratory ARREST Do not perform Intubation, CPR, defibrillation or ACLS   In the event of cardiac or respiratory ARREST Use medication by any route, position, wound care, and other measures to relive pain and suffering. May use oxygen, suction and manual treatment of airway obstruction as needed for comfort.      01/22/21 2332           Code Status History     Date Active Date Inactive Code Status Order ID Comments User Context   12/13/2020 1307 12/25/2020 2128 DNR [id number]  Pershing Proud, NP Inpatient   12/12/2020 0804 12/13/2020 1307 Full Code [id number]  [name], MD ED   08/15/2019 1553 08/21/2019 1908 DNR [id number]  Karmen Bongo, MD ED   06/15/2019 2308 06/24/2019 1607 DNR [id number]  Shela Leff, MD ED   06/15/2019 2308 06/15/2019 2308 Full Code [id number]  Shela Leff, MD ED   04/15/2019 1809 04/20/2019 1700 DNR [id number]  Georgette Shell, MD ED   04/15/2019 1741 04/15/2019 1809 Full Code [id number]  Georgette Shell, MD ED   05/04/2018 1443 05/08/2018 1621 DNR [id number]  Elodia Florence., MD Inpatient   06/09/2015 1037 06/11/2015 1711 Full Code [id number]  Lanae Crumbly, PA-C Inpatient   05/07/2012 1400 05/09/2012 1556 Full Code [id number]  Wofford, Donnamae Jude,  RN Inpatient   03/19/2012 1157 03/21/2012 1901 Full Code [id number]  Johnsie Cancel, RN Inpatient      Advance Directive Documentation    Flowsheet Row Most Recent Value  Type of Advance Directive Healthcare Power of [place], Living will, Out of facility DNR (pink MOST or yellow form)  Pre-existing out of facility DNR order (yellow form or pink MOST form) Pink MOST/Yellow Form most recent copy in chart - Physician notified to receive inpatient order  "MOST" Form in Place? --      Family Communication: No  family at bedside Disposition Plan:  Status is: Inpatient  Remains inpatient appropriate because:Inpatient level of care appropriate due to severity of illness  Dispo: The patient is from: Home              Anticipated d/c is to: Home              Patient currently is not medically stable to d/c.   Difficult to place patient No      Consultants:  Urology  Antimicrobials:  Anti-infectives (From admission, onward)    Start     Dose/Rate Route Frequency Ordered Stop   01/22/21 1700  cefTRIAXone (ROCEPHIN) 2 g in sodium chloride 0.9 % 100 mL IVPB        2 g 200 mL/hr over 30 Minutes Intravenous  Once 01/22/21 1645 01/22/21 1915        Objective: Vitals:   01/23/21 2007 01/24/21 0014 01/24/21 0432 01/24/21 1013  BP: 117/76 104/72 131/81 (!) 103/58  Pulse: 72 72 63 67  Resp: (!) 25 20 20    Temp: 98.1 F (36.7 C) (!) 97.4 F (36.3 C) 98.1 F (36.7 C)   TempSrc: Oral Oral    SpO2: 99% 100% 99%   Weight:   91.1 kg   Height:        Intake/Output Summary (Last 24 hours) at 01/24/2021 1026 Last data filed at 01/24/2021 0437 Gross per 24 hour  Intake 5460 ml  Output 2250 ml  Net 3210 ml    Filed Weights   01/22/21 1013 01/24/21 0432  Weight: 79.4 kg 91.1 kg   Examination: General exam: Appears calm and comfortable  Respiratory system: Clear to auscultation. Respiratory effort normal. Cardiovascular system: S1 & S2 heard, RRR. No pedal edema. Gastrointestinal system: Abdomen is nondistended, soft and nontender. Normal bowel sounds heard. Central nervous system: Alert and oriented. Non focal exam. Speech clear  Extremities: Symmetric in appearance bilaterally  Skin: No rashes, lesions or ulcers on exposed skin  Psychiatry: Judgement and insight appear stable. Mood & affect appropriate.     Data Reviewed: I have personally reviewed following labs and imaging studies  CBC: Recent Labs  Lab 01/22/21 1152 01/23/21 0531 01/24/21 0443  WBC 8.9 12.0* 13.3*   NEUTROABS 6.8  --   --   HGB 13.3 10.6* 10.2*  HCT 41.2 33.0* 32.0*  MCV 91.8 92.2 93.6  PLT 326 247 778    Basic Metabolic Panel: Recent Labs  Lab 01/22/21 1152 01/23/21 0531 01/24/21 0443  NA 137 134* 135  K 4.5 3.8 4.2  CL 104 102 103  CO2 21* 23 26  GLUCOSE 122* 110* 147*  BUN 24* 34* 33*  CREATININE 0.94 0.96 0.77  CALCIUM 10.2 9.2 9.4    GFR: Estimated Creatinine Clearance: 67.4 mL/min (by C-G formula based on SCr of 0.77 mg/dL). Liver Function Tests: Recent Labs  Lab 01/22/21 1152  AST 28  ALT 28  ALKPHOS 57  BILITOT 0.9  PROT 7.3  ALBUMIN 4.2    No results for input(s): LIPASE, AMYLASE in the last 168 hours. No results for input(s): AMMONIA in the last 168 hours. Coagulation Profile: No results for input(s): INR, PROTIME in the last 168 hours. Cardiac Enzymes: No results for input(s): CKTOTAL, CKMB, CKMBINDEX, TROPONINI in the last 168 hours. BNP (last 3 results) No results for input(s): PROBNP in the last 8760 hours. HbA1C: No results for input(s): HGBA1C in the last 72 hours. CBG: No results for input(s): GLUCAP in the last 168 hours. Lipid Profile: No results for input(s): CHOL, HDL, LDLCALC, TRIG, CHOLHDL, LDLDIRECT in the last 72 hours. Thyroid Function Tests: No results for input(s): TSH, T4TOTAL, FREET4, T3FREE, THYROIDAB in the last 72 hours. Anemia Panel: No results for input(s): VITAMINB12, FOLATE, FERRITIN, TIBC, IRON, RETICCTPCT in the last 72 hours. Sepsis Labs: No results for input(s): PROCALCITON, LATICACIDVEN in the last 168 hours.  Recent Results (from the past 240 hour(s))  Urine Culture     Status: None   Collection Time: 01/22/21  4:46 PM   Specimen: Urine, Clean Catch  Result Value Ref Range Status   Specimen Description   Final    URINE, CLEAN CATCH Performed at [facility], [place] [address], [place], [place] [address]    Special Requests   Final    NONE Performed at [facility],  [place] [address], [place], [place] [address]    Culture   Final    NO GROWTH Performed at [facility], [place] [address], [place], [place] [address]    Report Status 01/24/2021 FINAL  Final  Resp Panel by RT-PCR (Flu A&B, Covid) Nasopharyngeal Swab     Status: None   Collection Time: 01/22/21  5:08 PM   Specimen: Nasopharyngeal Swab; Nasopharyngeal(NP) swabs in vial transport medium  Result Value Ref Range Status   SARS Coronavirus 2 by RT PCR NEGATIVE NEGATIVE Final    Comment: (NOTE) SARS-CoV-2 target nucleic acids are NOT DETECTED.  The SARS-CoV-2 RNA is generally detectable in upper respiratory specimens during the acute phase of infection. The lowest concentration of SARS-CoV-2 viral copies this assay can detect is 138 copies/mL. A negative result does not preclude SARS-Cov-2 infection and should not be used as the sole basis for treatment or other patient management decisions. A negative result may occur with  improper specimen collection/handling, submission of specimen other than nasopharyngeal swab, presence of viral mutation(s) within the areas targeted by this assay, and inadequate number of viral copies(<138 copies/mL). A negative result must be combined with clinical observations, patient history, and epidemiological information. The expected result is Negative.  Fact Sheet for Patients:  EntrepreneurPulse.com.au  Fact Sheet for Healthcare Providers:  IncredibleEmployment.be  This test is no t yet approved or cleared by the [place] FDA and  has been authorized for detection and/or diagnosis of SARS-CoV-2 by FDA under an Emergency Use Authorization (EUA). This EUA will remain  in effect (meaning this test can be used) for the duration of the COVID-19 declaration under Section 564(b)(1) of the Act, 21 U.S.C.section 360bbb-3(b)(1), unless the authorization is terminated  or revoked sooner.       Influenza A by PCR  NEGATIVE NEGATIVE Final   Influenza B by PCR NEGATIVE NEGATIVE Final    Comment: (NOTE) The Xpert Xpress SARS-CoV-2/FLU/RSV plus assay is intended as an aid in the diagnosis of influenza from Nasopharyngeal swab specimens and should not be used as a sole basis  for treatment. Nasal washings and aspirates are unacceptable for Xpert Xpress SARS-CoV-2/FLU/RSV testing.  Fact Sheet for Patients: EntrepreneurPulse.com.au  Fact Sheet for Healthcare Providers: IncredibleEmployment.be  This test is not yet approved or cleared by the [place] FDA and has been authorized for detection and/or diagnosis of SARS-CoV-2 by FDA under an Emergency Use Authorization (EUA). This EUA will remain in effect (meaning this test can be used) for the duration of the COVID-19 declaration under Section 564(b)(1) of the Act, 21 U.S.C. section 360bbb-3(b)(1), unless the authorization is terminated or revoked.  Performed at [facility], [place] [address] [place], [place] [address]   Surgical pcr screen     Status: None   Collection Time: 01/23/21  9:47 AM   Specimen: Nasal Mucosa; Nasal Swab  Result Value Ref Range Status   MRSA, PCR NEGATIVE NEGATIVE Final   Staphylococcus aureus NEGATIVE NEGATIVE Final    Comment: (NOTE) The Xpert SA Assay (FDA approved for NASAL specimens in patients 44 years of age and older), is one component of a comprehensive surveillance program. It is not intended to diagnose infection nor to guide or monitor treatment. Performed at [facility], [place] [address], [place], [place] [address]        Radiology Studies: CT Renal Stone Study  Result Date: 01/22/2021 CLINICAL DATA:  Hematuria EXAM: CT ABDOMEN AND PELVIS WITHOUT CONTRAST TECHNIQUE: Multidetector CT imaging of the abdomen and pelvis was performed following the standard protocol without IV contrast. COMPARISON:  12/21/2020 FINDINGS: Lower chest:  Mild reticulonodular scarring is noted in the bases bilaterally. Hepatobiliary: Multiple gallstones are noted. No gallbladder wall thickening or pericholecystic fluid is noted. The liver is within normal limits. Pancreas: Unremarkable. No pancreatic ductal dilatation or surrounding inflammatory changes. Spleen: Normal in size without focal abnormality. Adrenals/Urinary Tract: Adrenal glands are within normal limits bilaterally. Cysts are seen in the kidneys bilaterally. Increased fullness of the collecting systems is noted bilaterally likely related to compression on the ureter and bladder by internal iliac artery aneurysms. Bladder demonstrates diffuse increased attenuation within consistent with the known history of hematuria. Posterior defect in the hematocrit level is noted along the right posterior bladder wall best seen on image number 72 of series 5. This is of uncertain significance as no definitive mass was noted in this region on prior CT. Stomach/Bowel: The appendix is not well visualized. No inflammatory changes are seen. The colon and small bowel show no obstructive or inflammatory changes. Stomach is distended with fluid without obstructing lesion. Vascular/Lymphatic: Changes are noted consistent with prior abdominal aortic repair. The overall appearance is stable from the prior exam. Delineation of the lumen is difficult given the lack of IV contrast. Internal iliac artery aneurysms are noted bilaterally as well as common femoral artery aneurysms stable in appearance from the prior exam. Reproductive: Fiducial markers are noted in the prostate bed. Other: No abdominal wall hernia or abnormality. No abdominopelvic ascites. Musculoskeletal: Bilateral femur surgery is seen. Degenerative changes of lumbar spine are noted. IMPRESSION: Increased attenuating material in the bladder consistent with the known history of hematuria. A defect is noted in the posterior aspect of the blood/thrombus suspicious for a  mass lesion although no thickening was seen on recent CT examination in this region. Cholelithiasis without complicating factors. Bibasilar scarring similar to that seen on prior CT. Stable abdominal aortic aneurysm repair and bilateral iliac artery aneurysms similar to that seen on the recent exam. Electronically Signed   By: Linus Mako.D.  On: 01/22/2021 14:12      Scheduled Meds:  carvedilol  3.125 mg Oral BID WC   Chlorhexidine Gluconate Cloth  6 each Topical Daily   finasteride  5 mg Oral Daily   melatonin  3 mg Oral QHS   pantoprazole  40 mg Oral Daily   sertraline  50 mg Oral Daily   simvastatin  40 mg Oral q1800   sodium chloride flush  3 mL Intravenous Q12H   spironolactone  12.5 mg Oral Daily   tamsulosin  0.4 mg Oral Daily   Continuous Infusions:  sodium chloride     sodium chloride irrigation       LOS: 1 day      Time spent: 20 minutes   Dessa Phi, DO [facility] 01/24/2021, 10:26 AM   Available via Epic secure chat 7am-7pm After these hours, please refer to coverage provider listed on [email or web address]

## 2021-01-24 NOTE — Discharge Summary (Signed)
Physician Discharge Summary  Jesse [name] DJM:[id number] DOB: September 02, 1927 DOA: 01/22/2021  PCP: [name], MD  Admit date: 01/22/2021 Discharge date: 01/24/2021  Admitted From: Home Disposition:  Home  Recommendations for Outpatient Follow-up:  Follow up with Urology in 5 days   Discharge Condition: Stable CODE STATUS: DNR  Diet recommendation:  Diet Orders (From admission, onward)     Start     Ordered   01/23/21 1307  Diet regular Room service appropriate? Yes; Fluid consistency: Thin  Diet effective now       Question Answer Comment  Room service appropriate? Yes   Fluid consistency: Thin      01/23/21 1306           Brief/Interim Summary: Jesse [name] is a 86 y.o. male with medical history significant of AAA, prostate cancer, coronary artery disease, GERD, hyperlipidemia, hypertension who presented to the hospital with severe abdominal pain and bloody urine.  He was in his usual state of health, had some chills overnight, then woke up this morning with abdominal pain.  He was also passing bright red blood in his urine.  He denies any other symptoms, no chest pain or shortness of breath, no nausea or vomiting.   ED Course: Foley catheter was placed and bladder was irrigated.  CT renal stone also showed extended bladder.  Urology came to see the patient in the emergency department, recommend irrigation, ceftriaxone and monitoring.  Patient underwent cystoscopy with clot evacuation and fulguration 7/2.  Discharge Diagnoses:  Principal Problem:   Gross hematuria Active Problems:   Mixed hyperlipidemia   Essential hypertension   Anxiety   History of prostate cancer   Hematuria   Gross hematuria -CT renal stone: Increased attenuating material in the bladder consistent with the known history of hematuria. A defect is noted in the posterior aspect of the blood/thrombus suspicious for a mass lesion although no thickening was seen on recent CT examination in this region. -Status  post cystoscopy with clot evacuation and fulguration 7/2 -Urine culture negative. S/p rocephin  -Urology following, follow up in 5 days for voiding trial    Hypertension -Continue Coreg, Aldactone  BPH -Continue Proscar, Flomax  GERD -Continue Protonix  Depression/anxiety -Continue Zoloft  Hyperlipidemia -Continue Zocor  Discharge Instructions  Discharge Instructions     Call MD for:  difficulty breathing, headache or visual disturbances   Complete by: As directed    Call MD for:  extreme fatigue   Complete by: As directed    Call MD for:  persistant dizziness or light-headedness   Complete by: As directed    Call MD for:  persistant nausea and vomiting   Complete by: As directed    Call MD for:  severe uncontrolled pain   Complete by: As directed    Call MD for:  temperature >100.4   Complete by: As directed    Discharge instructions   Complete by: As directed    You were cared for by a hospitalist during your hospital stay. If you have any questions about your discharge medications or the care you received while you were in the hospital after you are discharged, you can call the unit and ask to speak with the hospitalist on call if the hospitalist that took care of you is not available. Once you are discharged, your primary care physician will handle any further medical issues. Please note that NO REFILLS for any discharge medications will be authorized once you are discharged, as it is imperative  that you return to your primary care physician (or establish a relationship with a primary care physician if you do not have one) for your aftercare needs so that they can reassess your need for medications and monitor your lab values.   Increase activity slowly   Complete by: As directed    No wound care   Complete by: As directed       Allergies as of 01/24/2021       Reactions   Quinolones Other (See Comments)   unknown   Adhesive [tape] Itching, Rash         Medication List     TAKE these medications    acetaminophen 500 MG tablet Commonly known as: TYLENOL Take 500 mg by mouth every 6 (six) hours as needed for mild pain or headache.   alendronate 70 MG tablet Commonly known as: FOSAMAX Take 70 mg by mouth every Saturday. Saturday   aspirin 81 MG tablet Take 1 tablet (81 mg total) by mouth daily.   carboxymethylcellulose 0.5 % Soln Commonly known as: REFRESH PLUS Apply 2 drops to eye at bedtime.   carvedilol 3.125 MG tablet Commonly known as: COREG TAKE 1 TABLET BY MOUTH 2 TIMES DAILY WITH A MEAL   docusate sodium 100 MG capsule Commonly known as: Colace Take 1 capsule (100 mg total) by mouth 2 (two) times daily.   Dulcolax 5 MG EC tablet Generic drug: bisacodyl Take 1 tablet (5 mg total) by mouth daily as needed for moderate constipation.   fenofibrate 160 MG tablet Take 1 tablet (160 mg total) by mouth daily.   finasteride 5 MG tablet Commonly known as: PROSCAR Take 5 mg by mouth daily.   melatonin 3 MG Tabs tablet Take 3 mg by mouth at bedtime as needed (sleep).   nitrofurantoin (macrocrystal-monohydrate) 100 MG capsule Commonly known as: Macrobid Take 1 capsule (100 mg total) by mouth at bedtime for 5 days.   OPTIVE 0.5-0.9 % ophthalmic solution Generic drug: carboxymethylcellul-glycerin Place 2 drops into both eyes at bedtime.   pantoprazole 40 MG tablet Commonly known as: PROTONIX Take 40 mg by mouth daily.   polyethylene glycol 17 g packet Commonly known as: MIRALAX / GLYCOLAX Take 17 g by mouth daily.   sertraline 50 MG tablet Commonly known as: ZOLOFT Take 50 mg by mouth daily.   simvastatin 40 MG tablet Commonly known as: ZOCOR Take 1 tablet (40 mg total) by mouth daily at 6 PM.   sodium chloride 0.65 % Soln nasal spray Commonly known as: OCEAN Place 2 sprays into both nostrils 2 (two) times daily.   spironolactone 25 MG tablet Commonly known as: ALDACTONE Take 12.5 mg by mouth daily.    tamsulosin 0.4 MG Caps capsule Commonly known as: FLOMAX Take 0.4 mg by mouth daily.        Follow-up Information     Remi Haggard, MD. Schedule an appointment as soon as possible for a visit in 5 day(s).   Specialty: Urology Contact information: [address] [place] [address] [phone]                Allergies  Allergen Reactions   Quinolones Other (See Comments)    unknown   Adhesive [Tape] Itching and Rash    Consultations: Urology    Procedures/Studies: CT Renal Stone Study  Result Date: 01/22/2021 CLINICAL DATA:  Hematuria EXAM: CT ABDOMEN AND PELVIS WITHOUT CONTRAST TECHNIQUE: Multidetector CT imaging of the abdomen and pelvis was performed  following the standard protocol without IV contrast. COMPARISON:  12/21/2020 FINDINGS: Lower chest: Mild reticulonodular scarring is noted in the bases bilaterally. Hepatobiliary: Multiple gallstones are noted. No gallbladder wall thickening or pericholecystic fluid is noted. The liver is within normal limits. Pancreas: Unremarkable. No pancreatic ductal dilatation or surrounding inflammatory changes. Spleen: Normal in size without focal abnormality. Adrenals/Urinary Tract: Adrenal glands are within normal limits bilaterally. Cysts are seen in the kidneys bilaterally. Increased fullness of the collecting systems is noted bilaterally likely related to compression on the ureter and bladder by internal iliac artery aneurysms. Bladder demonstrates diffuse increased attenuation within consistent with the known history of hematuria. Posterior defect in the hematocrit level is noted along the right posterior bladder wall best seen on image number 72 of series 5. This is of uncertain significance as no definitive mass was noted in this region on prior CT. Stomach/Bowel: The appendix is not well visualized. No inflammatory changes are seen. The colon and small bowel show no obstructive or inflammatory changes. Stomach is  distended with fluid without obstructing lesion. Vascular/Lymphatic: Changes are noted consistent with prior abdominal aortic repair. The overall appearance is stable from the prior exam. Delineation of the lumen is difficult given the lack of IV contrast. Internal iliac artery aneurysms are noted bilaterally as well as common femoral artery aneurysms stable in appearance from the prior exam. Reproductive: Fiducial markers are noted in the prostate bed. Other: No abdominal wall hernia or abnormality. No abdominopelvic ascites. Musculoskeletal: Bilateral femur surgery is seen. Degenerative changes of lumbar spine are noted. IMPRESSION: Increased attenuating material in the bladder consistent with the known history of hematuria. A defect is noted in the posterior aspect of the blood/thrombus suspicious for a mass lesion although no thickening was seen on recent CT examination in this region. Cholelithiasis without complicating factors. Bibasilar scarring similar to that seen on prior CT. Stable abdominal aortic aneurysm repair and bilateral iliac artery aneurysms similar to that seen on the recent exam. Electronically Signed   By: Inez Catalina M.D.   On: 01/22/2021 14:12       Discharge Exam: Vitals:   01/24/21 0432 01/24/21 1013  BP: 131/81 (!) 103/58  Pulse: 63 67  Resp: 20   Temp: 98.1 F (36.7 C)   SpO2: 99%    General exam: Appears calm and comfortable Respiratory system: Clear to auscultation. Respiratory effort normal. Cardiovascular system: S1 & S2 heard, RRR. No pedal edema. Gastrointestinal system: Abdomen is nondistended, soft and nontender. Normal bowel sounds heard. Central nervous system: Alert and oriented. Non focal exam. Speech clear Extremities: Symmetric in appearance bilaterally Skin: No rashes, lesions or ulcers on exposed skin Psychiatry: Judgement and insight appear stable. Mood & affect appropriate.   The results of significant diagnostics from this hospitalization  (including imaging, microbiology, ancillary and laboratory) are listed below for reference.     Microbiology: Recent Results (from the past 240 hour(s))  Urine Culture     Status: None   Collection Time: 01/22/21  4:46 PM   Specimen: Urine, Clean Catch  Result Value Ref Range Status   Specimen Description   Final    URINE, CLEAN CATCH Performed at [facility], [place] [address], [place], [place] [address]    Special Requests   Final    NONE Performed at [facility], [place] [address], [place],  [address]    Culture   Final    NO GROWTH Performed at [facility], [place]  [address], [place], [place] [address]    Report Status 01/24/2021 FINAL  Final  Resp Panel by RT-PCR (Flu A&B, Covid) Nasopharyngeal Swab     Status: None   Collection Time: 01/22/21  5:08 PM   Specimen: Nasopharyngeal Swab; Nasopharyngeal(NP) swabs in vial transport medium  Result Value Ref Range Status   SARS Coronavirus 2 by RT PCR NEGATIVE NEGATIVE Final    Comment: (NOTE) SARS-CoV-2 target nucleic acids are NOT DETECTED.  The SARS-CoV-2 RNA is generally detectable in upper respiratory specimens during the acute phase of infection. The lowest concentration of SARS-CoV-2 viral copies this assay can detect is 138 copies/mL. A negative result does not preclude SARS-Cov-2 infection and should not be used as the sole basis for treatment or other patient management decisions. A negative result may occur with  improper specimen collection/handling, submission of specimen other than nasopharyngeal swab, presence of viral mutation(s) within the areas targeted by this assay, and inadequate number of viral copies(<138 copies/mL). A negative result must be combined with clinical observations, patient history, and epidemiological information. The expected result is Negative.  Fact Sheet for Patients:  EntrepreneurPulse.com.au  Fact Sheet for Healthcare  Providers:  IncredibleEmployment.be  This test is no t yet approved or cleared by the [place] FDA and  has been authorized for detection and/or diagnosis of SARS-CoV-2 by FDA under an Emergency Use Authorization (EUA). This EUA will remain  in effect (meaning this test can be used) for the duration of the COVID-19 declaration under Section 564(b)(1) of the Act, 21 U.S.C.section 360bbb-3(b)(1), unless the authorization is terminated  or revoked sooner.       Influenza A by PCR NEGATIVE NEGATIVE Final   Influenza B by PCR NEGATIVE NEGATIVE Final    Comment: (NOTE) The Xpert Xpress SARS-CoV-2/FLU/RSV plus assay is intended as an aid in the diagnosis of influenza from Nasopharyngeal swab specimens and should not be used as a sole basis for treatment. Nasal washings and aspirates are unacceptable for Xpert Xpress SARS-CoV-2/FLU/RSV testing.  Fact Sheet for Patients: EntrepreneurPulse.com.au  Fact Sheet for Healthcare Providers: IncredibleEmployment.be  This test is not yet approved or cleared by the [place] FDA and has been authorized for detection and/or diagnosis of SARS-CoV-2 by FDA under an Emergency Use Authorization (EUA). This EUA will remain in effect (meaning this test can be used) for the duration of the COVID-19 declaration under Section 564(b)(1) of the Act, 21 U.S.C. section 360bbb-3(b)(1), unless the authorization is terminated or revoked.  Performed at [facility], [place] [address], [place], [place] [address]   Surgical pcr screen     Status: None   Collection Time: 01/23/21  9:47 AM   Specimen: Nasal Mucosa; Nasal Swab  Result Value Ref Range Status   MRSA, PCR NEGATIVE NEGATIVE Final   Staphylococcus aureus NEGATIVE NEGATIVE Final    Comment: (NOTE) The Xpert SA Assay (FDA approved for NASAL specimens in patients 29 years of age and older), is one component of a  comprehensive surveillance program. It is not intended to diagnose infection nor to guide or monitor treatment. Performed at [facility], [place] [address], [place],  [address]      Labs: BNP (last 3 results) No results for input(s): BNP in the last 8760 hours. Basic Metabolic Panel: Recent Labs  Lab 01/22/21 1152 01/23/21 0531 01/24/21 0443  NA 137 134* 135  K 4.5 3.8 4.2  CL 104 102 103  CO2 21* 23 26  GLUCOSE 122* 110* 147*  BUN 24* 34* 33*  CREATININE 0.94 0.96 0.77  CALCIUM 10.2 9.2 9.4   Liver Function Tests: Recent Labs  Lab 01/22/21 1152  AST 28  ALT 28  ALKPHOS 57  BILITOT 0.9  PROT 7.3  ALBUMIN 4.2   No results for input(s): LIPASE, AMYLASE in the last 168 hours. No results for input(s): AMMONIA in the last 168 hours. CBC: Recent Labs  Lab 01/22/21 1152 01/23/21 0531 01/24/21 0443  WBC 8.9 12.0* 13.3*  NEUTROABS 6.8  --   --   HGB 13.3 10.6* 10.2*  HCT 41.2 33.0* 32.0*  MCV 91.8 92.2 93.6  PLT 326 247 252   Cardiac Enzymes: No results for input(s): CKTOTAL, CKMB, CKMBINDEX, TROPONINI in the last 168 hours. BNP: Invalid input(s): POCBNP CBG: No results for input(s): GLUCAP in the last 168 hours. D-Dimer No results for input(s): DDIMER in the last 72 hours. Hgb A1c No results for input(s): HGBA1C in the last 72 hours. Lipid Profile No results for input(s): CHOL, HDL, LDLCALC, TRIG, CHOLHDL, LDLDIRECT in the last 72 hours. Thyroid function studies No results for input(s): TSH, T4TOTAL, T3FREE, THYROIDAB in the last 72 hours.  Invalid input(s): FREET3 Anemia work up No results for input(s): VITAMINB12, FOLATE, FERRITIN, TIBC, IRON, RETICCTPCT in the last 72 hours. Urinalysis    Component Value Date/Time   COLORURINE RED (A) 01/22/2021 1227   APPEARANCEUR TURBID (A) 01/22/2021 1227   LABSPEC  01/22/2021 1227    TEST NOT REPORTED DUE TO COLOR INTERFERENCE OF URINE PIGMENT   PHURINE  01/22/2021 1227    TEST NOT  REPORTED DUE TO COLOR INTERFERENCE OF URINE PIGMENT   GLUCOSEU (A) 01/22/2021 1227    TEST NOT REPORTED DUE TO COLOR INTERFERENCE OF URINE PIGMENT   HGBUR (A) 01/22/2021 1227    TEST NOT REPORTED DUE TO COLOR INTERFERENCE OF URINE PIGMENT   BILIRUBINUR (A) 01/22/2021 1227    TEST NOT REPORTED DUE TO COLOR INTERFERENCE OF URINE PIGMENT   KETONESUR (A) 01/22/2021 1227    TEST NOT REPORTED DUE TO COLOR INTERFERENCE OF URINE PIGMENT   PROTEINUR (A) 01/22/2021 1227    TEST NOT REPORTED DUE TO COLOR INTERFERENCE OF URINE PIGMENT   UROBILINOGEN 2.0 (H) 05/28/2015 0029   NITRITE (A) 01/22/2021 1227    TEST NOT REPORTED DUE TO COLOR INTERFERENCE OF URINE PIGMENT   LEUKOCYTESUR (A) 01/22/2021 1227    TEST NOT REPORTED DUE TO COLOR INTERFERENCE OF URINE PIGMENT   Sepsis Labs Invalid input(s): PROCALCITONIN,  WBC,  LACTICIDVEN Microbiology Recent Results (from the past 240 hour(s))  Urine Culture     Status: None   Collection Time: 01/22/21  4:46 PM   Specimen: Urine, Clean Catch  Result Value Ref Range Status   Specimen Description   Final    URINE, CLEAN CATCH Performed at [facility], [place] [address] [place], [place] [address]    Special Requests   Final    NONE Performed at [facility], [place] [address], [place], [place] [address]    Culture   Final    NO GROWTH Performed at [facility], [place] [address], [place],  [address]    Report Status 01/24/2021 FINAL  Final  Resp Panel by RT-PCR (Flu A&B, Covid) Nasopharyngeal Swab     Status: None   Collection Time: 01/22/21  5:08 PM   Specimen: Nasopharyngeal Swab; Nasopharyngeal(NP) swabs in vial transport medium  Result Value Ref Range Status   SARS Coronavirus 2  by RT PCR NEGATIVE NEGATIVE Final    Comment: (NOTE) SARS-CoV-2 target nucleic acids are NOT DETECTED.  The SARS-CoV-2 RNA is generally detectable in upper respiratory specimens during the acute phase of infection. The  lowest concentration of SARS-CoV-2 viral copies this assay can detect is 138 copies/mL. A negative result does not preclude SARS-Cov-2 infection and should not be used as the sole basis for treatment or other patient management decisions. A negative result may occur with  improper specimen collection/handling, submission of specimen other than nasopharyngeal swab, presence of viral mutation(s) within the areas targeted by this assay, and inadequate number of viral copies(<138 copies/mL). A negative result must be combined with clinical observations, patient history, and epidemiological information. The expected result is Negative.  Fact Sheet for Patients:  EntrepreneurPulse.com.au  Fact Sheet for Healthcare Providers:  IncredibleEmployment.be  This test is no t yet approved or cleared by the [place] FDA and  has been authorized for detection and/or diagnosis of SARS-CoV-2 by FDA under an Emergency Use Authorization (EUA). This EUA will remain  in effect (meaning this test can be used) for the duration of the COVID-19 declaration under Section 564(b)(1) of the Act, 21 U.S.C.section 360bbb-3(b)(1), unless the authorization is terminated  or revoked sooner.       Influenza A by PCR NEGATIVE NEGATIVE Final   Influenza B by PCR NEGATIVE NEGATIVE Final    Comment: (NOTE) The Xpert Xpress SARS-CoV-2/FLU/RSV plus assay is intended as an aid in the diagnosis of influenza from Nasopharyngeal swab specimens and should not be used as a sole basis for treatment. Nasal washings and aspirates are unacceptable for Xpert Xpress SARS-CoV-2/FLU/RSV testing.  Fact Sheet for Patients: EntrepreneurPulse.com.au  Fact Sheet for Healthcare Providers: IncredibleEmployment.be  This test is not yet approved or cleared by the [place] FDA and has been authorized for detection and/or diagnosis of SARS-CoV-2 by FDA under  an Emergency Use Authorization (EUA). This EUA will remain in effect (meaning this test can be used) for the duration of the COVID-19 declaration under Section 564(b)(1) of the Act, 21 U.S.C. section 360bbb-3(b)(1), unless the authorization is terminated or revoked.  Performed at [facility], [place] [address] [place], [place] [address]   Surgical pcr screen     Status: None   Collection Time: 01/23/21  9:47 AM   Specimen: Nasal Mucosa; Nasal Swab  Result Value Ref Range Status   MRSA, PCR NEGATIVE NEGATIVE Final   Staphylococcus aureus NEGATIVE NEGATIVE Final    Comment: (NOTE) The Xpert SA Assay (FDA approved for NASAL specimens in patients 67 years of age and older), is one component of a comprehensive surveillance program. It is not intended to diagnose infection nor to guide or monitor treatment. Performed at [facility], [place] [address], [place], [place] [address]      Patient was seen and examined on the day of discharge and was found to be in stable condition. Time coordinating discharge: 25 minutes including assessment and coordination of care, as well as examination of the patient.   SIGNED:  Dessa Phi, DO [facility] 01/24/2021, 1:03 PM

## 2021-01-24 NOTE — Progress Notes (Signed)
RN to room to assess patients ability to walk/care for catheter/ take care of himself at ALF. He was only able to stand at bedside for a moment before becoming shaky and needing to sit back down. He is unable to care for his catheter on his own at this time. MD notified, see new orders.

## 2021-01-24 NOTE — Progress Notes (Addendum)
1 Day Post-Op Subjective: Patient reports he is tolerating Foley but had some bladder spasms.  Objective: Vital signs in last 24 hours: Temp:  [97.4 F (36.3 C)-98.1 F (36.7 C)] 98.1 F (36.7 C) (07/03 0432) Pulse Rate:  [63-75] 67 (07/03 1013) Resp:  [13-25] 20 (07/03 0432) BP: (103-148)/(58-86) 103/58 (07/03 1013) SpO2:  [99 %-100 %] 99 % (07/03 0432) Weight:  [91.1 kg] 91.1 kg (07/03 0432)  Intake/Output from previous day: 07/02 0701 - 07/03 0700 In: 6260 [P.O.:960; I.V.:600] Out: 3250 [Urine:3250] Intake/Output this shift: No intake/output data recorded.  Physical Exam:  Looks well in bed, no acute distress Abdomen soft and nontender GU-Foley in place, urine light tea colored on a CBI drip every few seconds.  CBI increased and urine immediately clears.  CBI DC'd and urine clear.  Lab Results: Recent Labs    01/22/21 1152 01/23/21 0531 01/24/21 0443  HGB 13.3 10.6* 10.2*  HCT 41.2 33.0* 32.0*   BMET Recent Labs    01/23/21 0531 01/24/21 0443  NA 134* 135  K 3.8 4.2  CL 102 103  CO2 23 26  GLUCOSE 110* 147*  BUN 34* 33*  CREATININE 0.96 0.77  CALCIUM 9.2 9.4   No results for input(s): LABPT, INR in the last 72 hours. No results for input(s): LABURIN in the last 72 hours. Results for orders placed or performed during the hospital encounter of 01/22/21  Urine Culture     Status: None   Collection Time: 01/22/21  4:46 PM   Specimen: Urine, Clean Catch  Result Value Ref Range Status   Specimen Description   Final    URINE, CLEAN CATCH Performed at [facility], [place] [address] [place], [place] [address]    Special Requests   Final    NONE Performed at [facility], [place] [address], [place], [place] [address]    Culture   Final    NO GROWTH Performed at [facility], [place] [address], [place],  [address]    Report Status 01/24/2021 FINAL  Final  Resp Panel by RT-PCR (Flu A&B, Covid) Nasopharyngeal  Swab     Status: None   Collection Time: 01/22/21  5:08 PM   Specimen: Nasopharyngeal Swab; Nasopharyngeal(NP) swabs in vial transport medium  Result Value Ref Range Status   SARS Coronavirus 2 by RT PCR NEGATIVE NEGATIVE Final    Comment: (NOTE) SARS-CoV-2 target nucleic acids are NOT DETECTED.  The SARS-CoV-2 RNA is generally detectable in upper respiratory specimens during the acute phase of infection. The lowest concentration of SARS-CoV-2 viral copies this assay can detect is 138 copies/mL. A negative result does not preclude SARS-Cov-2 infection and should not be used as the sole basis for treatment or other patient management decisions. A negative result may occur with  improper specimen collection/handling, submission of specimen other than nasopharyngeal swab, presence of viral mutation(s) within the areas targeted by this assay, and inadequate number of viral copies(<138 copies/mL). A negative result must be combined with clinical observations, patient history, and epidemiological information. The expected result is Negative.  Fact Sheet for Patients:  EntrepreneurPulse.com.au  Fact Sheet for Healthcare Providers:  IncredibleEmployment.be  This test is no t yet approved or cleared by the [place] FDA and  has been authorized for detection and/or diagnosis of SARS-CoV-2 by FDA under an Emergency Use Authorization (EUA). This EUA will remain  in effect (meaning this test can be used) for the duration of the COVID-19 declaration under  Section 564(b)(1) of the Act, 21 U.S.C.section 360bbb-3(b)(1), unless the authorization is terminated  or revoked sooner.       Influenza A by PCR NEGATIVE NEGATIVE Final   Influenza B by PCR NEGATIVE NEGATIVE Final    Comment: (NOTE) The Xpert Xpress SARS-CoV-2/FLU/RSV plus assay is intended as an aid in the diagnosis of influenza from Nasopharyngeal swab specimens and should not be used as a sole  basis for treatment. Nasal washings and aspirates are unacceptable for Xpert Xpress SARS-CoV-2/FLU/RSV testing.  Fact Sheet for Patients: EntrepreneurPulse.com.au  Fact Sheet for Healthcare Providers: IncredibleEmployment.be  This test is not yet approved or cleared by the [place] FDA and has been authorized for detection and/or diagnosis of SARS-CoV-2 by FDA under an Emergency Use Authorization (EUA). This EUA will remain in effect (meaning this test can be used) for the duration of the COVID-19 declaration under Section 564(b)(1) of the Act, 21 U.S.C. section 360bbb-3(b)(1), unless the authorization is terminated or revoked.  Performed at [facility], [place] [address] [place], [place] [address]   Surgical pcr screen     Status: None   Collection Time: 01/23/21  9:47 AM   Specimen: Nasal Mucosa; Nasal Swab  Result Value Ref Range Status   MRSA, PCR NEGATIVE NEGATIVE Final   Staphylococcus aureus NEGATIVE NEGATIVE Final    Comment: (NOTE) The Xpert SA Assay (FDA approved for NASAL specimens in patients 3 years of age and older), is one component of a comprehensive surveillance program. It is not intended to diagnose infection nor to guide or monitor treatment. Performed at [facility], [place] [address] [place] [address], [place], [place] [address]     Studies/Results: CT Renal Stone Study  Result Date: 01/22/2021 CLINICAL DATA:  Hematuria EXAM: CT ABDOMEN AND PELVIS WITHOUT CONTRAST TECHNIQUE: Multidetector CT imaging of the abdomen and pelvis was performed following the standard protocol without IV contrast. COMPARISON:  12/21/2020 FINDINGS: Lower chest: Mild reticulonodular scarring is noted in the bases bilaterally. Hepatobiliary: Multiple gallstones are noted. No gallbladder wall thickening or pericholecystic fluid is noted. The liver is within normal limits. Pancreas: Unremarkable. No pancreatic ductal dilatation  or surrounding inflammatory changes. Spleen: Normal in size without focal abnormality. Adrenals/Urinary Tract: Adrenal glands are within normal limits bilaterally. Cysts are seen in the kidneys bilaterally. Increased fullness of the collecting systems is noted bilaterally likely related to compression on the ureter and bladder by internal iliac artery aneurysms. Bladder demonstrates diffuse increased attenuation within consistent with the known history of hematuria. Posterior defect in the hematocrit level is noted along the right posterior bladder wall best seen on image number 72 of series 5. This is of uncertain significance as no definitive mass was noted in this region on prior CT. Stomach/Bowel: The appendix is not well visualized. No inflammatory changes are seen. The colon and small bowel show no obstructive or inflammatory changes. Stomach is distended with fluid without obstructing lesion. Vascular/Lymphatic: Changes are noted consistent with prior abdominal aortic repair. The overall appearance is stable from the prior exam. Delineation of the lumen is difficult given the lack of IV contrast. Internal iliac artery aneurysms are noted bilaterally as well as common femoral artery aneurysms stable in appearance from the prior exam. Reproductive: Fiducial markers are noted in the prostate bed. Other: No abdominal wall hernia or abnormality. No abdominopelvic ascites. Musculoskeletal: Bilateral femur surgery is seen. Degenerative changes of lumbar spine are noted. IMPRESSION: Increased attenuating material in the bladder consistent with the known history of hematuria.  A defect is noted in the posterior aspect of the blood/thrombus suspicious for a mass lesion although no thickening was seen on recent CT examination in this region. Cholelithiasis without complicating factors. Bibasilar scarring similar to that seen on prior CT. Stable abdominal aortic aneurysm repair and bilateral iliac artery aneurysms similar  to that seen on the recent exam. Electronically Signed   By: Inez Catalina M.D.   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  He is stable for discharge from urologic point of view.  DC with Foley catheter to gravity drainage.  Keep on a nightly cephalexin or nitrofurantoin.  I sent message to office to follow-up in about 5 days in the morning for voiding trial.  Addendum 1525: Spoke to nurse about another pt and she mentioned Mr Galik will be here overnight and might need placement. Dr. Gloriann Loan reported an open prostate at the time of the procedure and void trial when able. Nurse reports urine has remained clear. Since Mr Maring is having bladder spasm and discharge with foley might complicate his care, I will give him a void trial in AM with a very early foley removal.    LOS: 1 day   Festus Aloe 01/24/2021, 11:46 AM

## 2021-01-25 DIAGNOSIS — R310 Gross hematuria: Secondary | ICD-10-CM | POA: Diagnosis not present

## 2021-01-25 DIAGNOSIS — N3041 Irradiation cystitis with hematuria: Secondary | ICD-10-CM | POA: Diagnosis not present

## 2021-01-25 LAB — CBC
HCT: 30.4 % — ABNORMAL LOW (ref 39.0–52.0)
Hemoglobin: 9.6 g/dL — ABNORMAL LOW (ref 13.0–17.0)
MCH: 29.4 pg (ref 26.0–34.0)
MCHC: 31.6 g/dL (ref 30.0–36.0)
MCV: 93.3 fL (ref 80.0–100.0)
Platelets: 246 10*3/uL (ref 150–400)
RBC: 3.26 MIL/uL — ABNORMAL LOW (ref 4.22–5.81)
RDW: 14.2 % (ref 11.5–15.5)
WBC: 10.8 10*3/uL — ABNORMAL HIGH (ref 4.0–10.5)
nRBC: 0 % (ref 0.0–0.2)

## 2021-01-25 LAB — BASIC METABOLIC PANEL
Anion gap: 5 (ref 5–15)
BUN: 31 mg/dL — ABNORMAL HIGH (ref 8–23)
CO2: 27 mmol/L (ref 22–32)
Calcium: 8.9 mg/dL (ref 8.9–10.3)
Chloride: 103 mmol/L (ref 98–111)
Creatinine, Ser: 0.72 mg/dL (ref 0.61–1.24)
GFR, Estimated: >60 mL/min (ref >60)
Glucose, Bld: 96 mg/dL (ref 70–99)
Potassium: 3.8 mmol/L (ref 3.5–5.1)
Sodium: 135 mmol/L (ref 135–145)

## 2021-01-25 MED ORDER — PHENAZOPYRIDINE HCL 100 MG PO TABS
100.0000 mg | ORAL_TABLET | Freq: Three times a day (TID) | ORAL | Status: DC
  Administered 2021-01-25 – 2021-01-26 (×3): 100 mg via ORAL
  Filled 2021-01-25 (×4): qty 1

## 2021-01-25 NOTE — Progress Notes (Signed)
Nutrition Brief Note  Patient identified on the Malnutrition Screening Tool (MST) Report; MST score of 2.0  Wt Readings from Last 15 Encounters:  01/24/21 91.1 kg  12/12/20 79.4 kg  04/09/20 80.3 kg  03/19/20 80.3 kg  02/20/20 79.8 kg  11/19/19 78 kg  08/17/19 78.1 kg  06/24/19 79.7 kg  05/30/19 80.4 kg  04/16/19 80.3 kg  01/10/19 80.7 kg  11/21/18 81.6 kg  09/25/18 86 kg  05/21/18 84.3 kg  05/08/18 84.3 kg    Body mass index is 27.24 kg/m. Patient meets criteria for overweight status based on current BMI.   Current diet order is Regular. He ate 100% of dinner on 7/2 (350 kcal and 15 grams protein). No other meal completions documented since that time.   Patient was admitted d/t gross hematuria. Discharge Summary was entered yesterday. Urology note from this AM states patient is stable for d/c from Urology standpoint.   TOC is following and note from today states patient is from [facility] but that he cannot d/c back there today d/t holiday staffing at the facility.   Labs and medications reviewed.   No nutrition interventions warranted at this time. If nutrition issues arise, please consult RD.      Jarome Matin, MS, RD, LDN, CNSC Inpatient Clinical Dietitian RD pager # available in [place]  After hours/weekend pager # available in [facility]

## 2021-01-25 NOTE — TOC Initial Note (Signed)
Transition of Care [facility]) - Initial/Assessment Note    Patient Details  Name: Jesse [name] MRN: [id number] Date of Birth: 26-Oct-1927  Transition of Care [facility]) CM/SW Contact:    Ross Ludwig, LCSW Phone Number: 01/25/2021, 12:15 PM  Clinical Narrative:                 Patient is a 87 year old male from [facility].  Patient has been living at ALF for several years.  Patient is currently receiving therapy at ALF.  Per patient's son, CSW was asked to contact ALF director Noralee Space [phone] to find out if patient can return.  CSW spoke to [place] and she said there is no one available to receive him today, but he can return tomorrow.  CSW asked director if a new covid test needs to be completed, and she said no.  Patient can return tomorrow and she will need an updated FL2 and discharge summary faxed to [phone].  CSW to continue to follow patient's progress throughout discharge planning.  Expected Discharge Plan: Assisted Living Barriers to Discharge: Other (must enter comment) (Patient from [facility], they can not accept today because no one is available to receive him.)   Patient Goals and CMS Choice Patient states their goals for this hospitalization and ongoing recovery are:: To return back to [facility] [place] [place]. CMS Medicare.gov Compare Post Acute Care list provided to:: Patient Represenative (must comment) Choice offered to / list presented to : Adult Children  Expected Discharge Plan and Services Expected Discharge Plan: Assisted Living In-house Referral: Clinical Social Work   Post Acute Care Choice: [place] arrangements for the past 2 months: [place] Expected Discharge Date: 01/24/21                         HH Arranged: OT, PT          Prior Living Arrangements/Services Living arrangements for the past 2 months: [place] Lives with:: Facility Resident Patient language and need for  interpreter reviewed:: Yes Do you feel safe going back to the place where you live?: Yes      Need for Family Participation in Patient Care: Yes (Comment) Care giver support system in place?: Yes (comment) Current home services: Home PT, Home OT Criminal Activity/Legal Involvement Pertinent to Current Situation/Hospitalization: No - Comment as needed  Activities of Daily Living Home Assistive Devices/Equipment: Eyeglasses (per computer notes, pt states he is in too much pain to answer questions currently) ADL Screening (condition at time of admission) Patient's cognitive ability adequate to safely complete daily activities?: Yes Is the patient deaf or have difficulty hearing?: Yes Does the patient have difficulty seeing, even when wearing glasses/contacts?: No Does the patient have difficulty concentrating, remembering, or making decisions?: No Patient able to express need for assistance with ADLs?: Yes Does the patient have difficulty dressing or bathing?: No Independently performs ADLs?: Yes (appropriate for developmental age) (per computer notes) Does the patient have difficulty walking or climbing stairs?: Yes Weakness of Legs: Both Weakness of Arms/Hands: Both  Permission Sought/Granted Permission sought to share information with : Facility Sport and exercise psychologist, Family Supports Permission granted to share information with : Yes, Release of Information Signed  Share Information with NAME: Larrie Kass [phone]  [phone]  Loni Dolly Daughter [phone]  [phone]  Permission granted to share info w AGENCY: [facility]        Emotional Assessment Appearance:: Appears  stated age   Affect (typically observed): Accepting, Appropriate, Calm Orientation: : Oriented to Self, Oriented to Place, Oriented to  Time, Oriented to Situation Alcohol / Substance Use: Not Applicable Psych Involvement: No (comment)  Admission diagnosis:  Gross hematuria  [R31.0] Hematuria [R31.9] Patient Active Problem List   Diagnosis Date Noted   Hematuria 01/23/2021   Palliative care by specialist    SBO (small bowel obstruction) ([place]) 12/12/2020   Abducens (sixth) nerve palsy, left 02/20/2020   Binocular vision disorder with diplopia 02/20/2020   Gross hematuria 08/15/2019   History of prostate cancer 08/15/2019   Anxiety 06/21/2019   Anemia due to chronic illness 06/20/2019   Pneumonia due to COVID-19 virus 06/20/2019   Displaced spiral fracture of shaft of right femur, initial encounter for open fracture type I or II ([place]) 06/20/2019   COVID-19 virus infection 06/19/2019   Goals of care, counseling/discussion    Hip fracture ([place]) 04/15/2019   Fall    Coronary artery disease involving native coronary artery of native heart without angina pectoris 11/20/2018   Thoracic aortic aneurysm without rupture ([place]) 25/36/6440   Chronic systolic heart failure ([place]) 11/20/2018   Duodenal ulcer with hemorrhage 05/05/2018   Hemorrhagic shock ([place]) 05/05/2018   AKI (acute kidney injury) ([place]) 05/05/2018   Orthostatic hypotension 05/04/2018   Symptomatic anemia 05/04/2018   Melena 05/04/2018   GI bleed 05/04/2018   Patella fracture 06/09/2015   Aftercare following surgery of the circulatory system, NEC 01/08/2013   Pain in limb- Left popliteal 12/25/2012   Aneurysm artery, femoral ([place]) 05/22/2012   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   Aneurysm of abdominal vessel ([place]) 02/21/2012   Iliac artery aneurysm, bilateral ([place]) 08/09/2011   Aneurysm of artery of lower extremity ([place]) 08/09/2011   HLD (hyperlipidemia) 01/16/2009   Essential hypertension 01/16/2009   CAD, ARTERY BYPASS GRAFT 01/16/2009   PERIPHERAL VASCULAR DISEASE 01/16/2009   ABDOMINAL AORTIC ANEURYSM REPAIR, HX OF 01/16/2009   Mixed hyperlipidemia 12/28/2008   PCP:  [name], MD Pharmacy:   [facility] ORDER) [place], [place] [address] Phone: [phone] Fax: [phone]  [place], [place] [address] Phone: [phone] Fax: [phone]  [facility] [place], [place] [address] Phone: [phone] Fax: [phone]     Social Determinants of Health (SDOH) Interventions    Readmission Risk Interventions No flowsheet data found.

## 2021-01-25 NOTE — Evaluation (Signed)
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  This is a 87 year old male with past medical history of hypertension, hyperlipidemia, RBBB, CAD s/p CABG, AAA s/p repair and s/p bilateral femoral aneurysm repair in '95, thoracic aneurysm, GI bleed, prostate cancer s/p XRT, bladder tumor s/p resection, distant repair of bowel obstruction, hernia repair with mesh and recent admission for SBO.  Clinical Impression  Pt admitted with above diagnosis.  Pt currently with functional limitations due to the deficits listed below (see PT Problem List). Pt will benefit from skilled PT to increase their independence and safety with mobility to allow discharge to the venue listed below.  Pt assisted with ambulating in hallway and using bathroom.  Pt requiring some assist and does not feel he can return to ALF at current function level.  Pt agreeable to d/c to SNF at this time.       Follow Up Recommendations SNF    Equipment Recommendations  None recommended by PT    Recommendations for Other Services       Precautions / Restrictions Precautions Precautions: Fall      Mobility  Bed Mobility Overal bed mobility: Needs Assistance Bed Mobility: Supine to Sit     Supine to sit: Min guard;HOB elevated     General bed mobility comments: increased time and effort    Transfers Overall transfer level: Needs assistance Equipment used: Rolling walker (2 wheeled) Transfers: Sit to/from Stand Sit to Stand: Min assist         General transfer comment: light assist to rise and steady initially, required use of grab bar for toilet transfer  Ambulation/Gait Ambulation/Gait assistance: Min assist;Min guard Gait Distance (Feet): 120  Feet Assistive device: Rolling walker (2 wheeled) Gait Pattern/deviations: Step-through pattern;Decreased stride length     General Gait Details: initial assist for stability however improved to min/guard  Stairs            Wheelchair Mobility    Modified Rankin (Stroke Patients Only)       Balance Overall balance assessment: Needs assistance         Standing balance support: No upper extremity supported Standing balance-Leahy Scale: Fair Standing balance comment: static fair                             Pertinent Vitals/Pain Pain Assessment: 0-10 Pain Score: 4  Pain Location: bladder spasms Pain Descriptors / Indicators: Aching;Sore Pain Intervention(s): Repositioned;Monitored during session    Home Living                 Home Equipment: Walker - 2 wheels;Cane - single point      Prior Function Level of Independence: Independent with assistive device(s)         Comments: Independent with ADLs. has supervision for bathing. Ambulates with cane or walker.     Hand Dominance        Extremity/Trunk Assessment        Lower Extremity Assessment Lower Extremity Assessment: Generalized weakness       Communication   Communication: HOH  Cognition Arousal/Alertness: Awake/alert Behavior During Therapy: WFL for tasks assessed/performed Overall Cognitive Status: Within Functional Limits for tasks assessed  General Comments      Exercises     Assessment/Plan    PT Assessment Patient needs continued PT services  PT Problem List Decreased strength;Decreased mobility;Decreased activity tolerance       PT Treatment Interventions DME instruction;Gait training;Therapeutic exercise;Balance training;Functional mobility training;Therapeutic activities;Patient/family education    PT Goals (Current goals can be found in the Care Plan section)  Acute Rehab PT Goals PT Goal  Formulation: With patient Time For Goal Achievement: 02/08/21 Potential to Achieve Goals: Good    Frequency Min 2X/week   Barriers to discharge        Co-evaluation               AM-PAC PT "6 Clicks" Mobility  Outcome Measure Help needed turning from your back to your side while in a flat bed without using bedrails?: A Little Help needed moving from lying on your back to sitting on the side of a flat bed without using bedrails?: A Little Help needed moving to and from a bed to a chair (including a wheelchair)?: A Little Help needed standing up from a chair using your arms (e.g., wheelchair or bedside chair)?: A Little Help needed to walk in hospital room?: A Little Help needed climbing 3-5 steps with a railing? : A Lot 6 Click Score: 17    End of Session Equipment Utilized During Treatment: Gait belt Activity Tolerance: Patient tolerated treatment well Patient left: in chair;with call bell/phone within reach;with chair alarm set Nurse Communication: Mobility status PT Visit Diagnosis: Other abnormalities of gait and mobility (R26.89)    Time: 2174-7159 PT Time Calculation (min) (ACUTE ONLY): 21 min   Charges:   PT Evaluation $PT Eval Low Complexity: 1 Low     Kati PT, DPT Acute Rehabilitation Services Pager: [phone] Office: [phone]   York Ram E 01/25/2021, 12:28 PM

## 2021-01-25 NOTE — Progress Notes (Signed)
  PROGRESS NOTE  Patient was supposed to discharge back to ALF yesterday, but patient did not feel that he could physically take care of of his foley catheter at home, also concern for safety.  Discharge was canceled.  PT consulted, recommended SNF today.  TOC discussed with ALF director, they could accept him tomorrow and continue therapy at ALF.  Patient was evaluated by Dr. Junious Silk, Foley catheter was discontinued this morning and patient had successful voiding trial.  Plan to discharge back to ALF tomorrow.  Dessa Phi, DO [facility] 01/25/2021, 12:46 PM  Available via Epic secure chat 7am-7pm After these hours, please refer to coverage provider listed on [email or web address]

## 2021-01-25 NOTE — Progress Notes (Signed)
   Foley d/c'd this AM and pt already voided in toilet with clear urine per pt and nurse. Voiding without difficulty.    Looks well in bed - eating eggs and sausage and watching TV Belly soft, NT  A/P --  Radiation cystitis with clot retention-status post cystoscopy clot evac fulguration 01/23/2021.  He is stable for discharge from urologic point of view.  I sent a message for follow-up to his primary urologist Dr. Milford Cage.

## 2021-01-26 DIAGNOSIS — R531 Weakness: Secondary | ICD-10-CM | POA: Diagnosis not present

## 2021-01-26 DIAGNOSIS — R310 Gross hematuria: Secondary | ICD-10-CM | POA: Diagnosis not present

## 2021-01-26 DIAGNOSIS — Z743 Need for continuous supervision: Secondary | ICD-10-CM | POA: Diagnosis not present

## 2021-01-26 NOTE — Discharge Summary (Signed)
Physician Discharge Summary  Jesse [name] KZL:[id number] DOB: Jul 27, 1927 DOA: 01/22/2021  PCP: [name], MD  Admit date: 01/22/2021 Discharge date: 01/26/2021  Admitted From: ALF Disposition:  ALF   Recommendations for Outpatient Follow-up:  Follow up with Urology in 5 days   Discharge Condition: Stable CODE STATUS: DNR  Diet recommendation:  Diet Orders (From admission, onward)     Start     Ordered   01/23/21 1307  Diet regular Room service appropriate? Yes; Fluid consistency: Thin  Diet effective now       Question Answer Comment  Room service appropriate? Yes   Fluid consistency: Thin      01/23/21 1306           Brief/Interim Summary: Jesse [name] is a 87 y.o. male with medical history significant of AAA, prostate cancer, coronary artery disease, GERD, hyperlipidemia, hypertension who presented to the hospital with severe abdominal pain and bloody urine.  He was in his usual state of health, had some chills overnight, then woke up this morning with abdominal pain.  He was also passing bright red blood in his urine.  He denies any other symptoms, no chest pain or shortness of breath, no nausea or vomiting.   ED Course: Foley catheter was placed and bladder was irrigated.  CT renal stone also showed extended bladder.  Urology came to see the patient in the emergency department, recommend irrigation, ceftriaxone and monitoring.  Patient underwent cystoscopy with clot evacuation and fulguration 7/2. Foley was removed prior to discharge.   Discharge Diagnoses:  Principal Problem:   Gross hematuria Active Problems:   Mixed hyperlipidemia   Essential hypertension   Anxiety   History of prostate cancer   Hematuria   Gross hematuria -CT renal stone: Increased attenuating material in the bladder consistent with the known history of hematuria. A defect is noted in the posterior aspect of the blood/thrombus suspicious for a mass lesion although no thickening was seen on recent  CT examination in this region. -Status post cystoscopy with clot evacuation and fulguration 7/2 -Urine culture negative. S/p rocephin  -Urology following, follow up outpatient    Hypertension -Continue Coreg, Aldactone  BPH -Continue Proscar, Flomax  GERD -Continue Protonix  Depression/anxiety -Continue Zoloft  Hyperlipidemia -Continue Zocor  Discharge Instructions  Discharge Instructions     Call MD for:  difficulty breathing, headache or visual disturbances   Complete by: As directed    Call MD for:  extreme fatigue   Complete by: As directed    Call MD for:  persistant dizziness or light-headedness   Complete by: As directed    Call MD for:  persistant nausea and vomiting   Complete by: As directed    Call MD for:  severe uncontrolled pain   Complete by: As directed    Call MD for:  temperature >100.4   Complete by: As directed    Discharge instructions   Complete by: As directed    You were cared for by a hospitalist during your hospital stay. If you have any questions about your discharge medications or the care you received while you were in the hospital after you are discharged, you can call the unit and ask to speak with the hospitalist on call if the hospitalist that took care of you is not available. Once you are discharged, your primary care physician will handle any further medical issues. Please note that NO REFILLS for any discharge medications will be authorized once you are discharged, as  it is imperative that you return to your primary care physician (or establish a relationship with a primary care physician if you do not have one) for your aftercare needs so that they can reassess your need for medications and monitor your lab values.   Increase activity slowly   Complete by: As directed    No wound care   Complete by: As directed       Allergies as of 01/26/2021       Reactions   Quinolones Other (See Comments)   unknown   Adhesive [tape] Itching,  Rash        Medication List     TAKE these medications    acetaminophen 500 MG tablet Commonly known as: TYLENOL Take 500 mg by mouth every 6 (six) hours as needed for mild pain or headache.   alendronate 70 MG tablet Commonly known as: FOSAMAX Take 70 mg by mouth every Saturday. Saturday   aspirin 81 MG tablet Take 1 tablet (81 mg total) by mouth daily.   carboxymethylcellulose 0.5 % Soln Commonly known as: REFRESH PLUS Apply 2 drops to eye at bedtime.   carvedilol 3.125 MG tablet Commonly known as: COREG TAKE 1 TABLET BY MOUTH 2 TIMES DAILY WITH A MEAL   docusate sodium 100 MG capsule Commonly known as: Colace Take 1 capsule (100 mg total) by mouth 2 (two) times daily.   Dulcolax 5 MG EC tablet Generic drug: bisacodyl Take 1 tablet (5 mg total) by mouth daily as needed for moderate constipation.   fenofibrate 160 MG tablet Take 1 tablet (160 mg total) by mouth daily.   finasteride 5 MG tablet Commonly known as: PROSCAR Take 5 mg by mouth daily.   melatonin 3 MG Tabs tablet Take 3 mg by mouth at bedtime as needed (sleep).   nitrofurantoin (macrocrystal-monohydrate) 100 MG capsule Commonly known as: Macrobid Take 1 capsule (100 mg total) by mouth at bedtime for 5 days.   OPTIVE 0.5-0.9 % ophthalmic solution Generic drug: carboxymethylcellul-glycerin Place 2 drops into both eyes at bedtime.   pantoprazole 40 MG tablet Commonly known as: PROTONIX Take 40 mg by mouth daily.   polyethylene glycol 17 g packet Commonly known as: MIRALAX / GLYCOLAX Take 17 g by mouth daily.   sertraline 50 MG tablet Commonly known as: ZOLOFT Take 50 mg by mouth daily.   simvastatin 40 MG tablet Commonly known as: ZOCOR Take 1 tablet (40 mg total) by mouth daily at 6 PM.   sodium chloride 0.65 % Soln nasal spray Commonly known as: OCEAN Place 2 sprays into both nostrils 2 (two) times daily.   spironolactone 25 MG tablet Commonly known as: ALDACTONE Take 12.5 mg by  mouth daily.   tamsulosin 0.4 MG Caps capsule Commonly known as: FLOMAX Take 0.4 mg by mouth daily.        Follow-up Information     Remi Haggard, MD. Schedule an appointment as soon as possible for a visit in 5 day(s).   Specialty: Urology Contact information: [address] [place] [address] [phone]                Allergies  Allergen Reactions   Quinolones Other (See Comments)    unknown   Adhesive [Tape] Itching and Rash    Consultations: Urology    Procedures/Studies: CT Renal Stone Study  Result Date: 01/22/2021 CLINICAL DATA:  Hematuria EXAM: CT ABDOMEN AND PELVIS WITHOUT CONTRAST TECHNIQUE: Multidetector CT imaging of the abdomen and  pelvis was performed following the standard protocol without IV contrast. COMPARISON:  12/21/2020 FINDINGS: Lower chest: Mild reticulonodular scarring is noted in the bases bilaterally. Hepatobiliary: Multiple gallstones are noted. No gallbladder wall thickening or pericholecystic fluid is noted. The liver is within normal limits. Pancreas: Unremarkable. No pancreatic ductal dilatation or surrounding inflammatory changes. Spleen: Normal in size without focal abnormality. Adrenals/Urinary Tract: Adrenal glands are within normal limits bilaterally. Cysts are seen in the kidneys bilaterally. Increased fullness of the collecting systems is noted bilaterally likely related to compression on the ureter and bladder by internal iliac artery aneurysms. Bladder demonstrates diffuse increased attenuation within consistent with the known history of hematuria. Posterior defect in the hematocrit level is noted along the right posterior bladder wall best seen on image number 72 of series 5. This is of uncertain significance as no definitive mass was noted in this region on prior CT. Stomach/Bowel: The appendix is not well visualized. No inflammatory changes are seen. The colon and small bowel show no obstructive or inflammatory changes.  Stomach is distended with fluid without obstructing lesion. Vascular/Lymphatic: Changes are noted consistent with prior abdominal aortic repair. The overall appearance is stable from the prior exam. Delineation of the lumen is difficult given the lack of IV contrast. Internal iliac artery aneurysms are noted bilaterally as well as common femoral artery aneurysms stable in appearance from the prior exam. Reproductive: Fiducial markers are noted in the prostate bed. Other: No abdominal wall hernia or abnormality. No abdominopelvic ascites. Musculoskeletal: Bilateral femur surgery is seen. Degenerative changes of lumbar spine are noted. IMPRESSION: Increased attenuating material in the bladder consistent with the known history of hematuria. A defect is noted in the posterior aspect of the blood/thrombus suspicious for a mass lesion although no thickening was seen on recent CT examination in this region. Cholelithiasis without complicating factors. Bibasilar scarring similar to that seen on prior CT. Stable abdominal aortic aneurysm repair and bilateral iliac artery aneurysms similar to that seen on the recent exam. Electronically Signed   By: Inez Catalina M.D.   On: 01/22/2021 14:12       Discharge Exam: Vitals:   01/25/21 2103 01/26/21 0406  BP: 110/73 (!) 143/85  Pulse: 71 63  Resp: 17 20  Temp: 98.4 F (36.9 C) 97.9 F (36.6 C)  SpO2: 97% 95%   General exam: Appears calm and comfortable Respiratory system: Clear to auscultation. Respiratory effort normal. Cardiovascular system: S1 & S2 heard, RRR. No pedal edema. Gastrointestinal system: Abdomen is nondistended, soft and nontender. Normal bowel sounds heard. Central nervous system: Alert and oriented. Non focal exam. Speech clear Extremities: Symmetric in appearance bilaterally Skin: No rashes, lesions or ulcers on exposed skin Psychiatry: Judgement and insight appear stable. Mood & affect appropriate.   The results of significant  diagnostics from this hospitalization (including imaging, microbiology, ancillary and laboratory) are listed below for reference.     Microbiology: Recent Results (from the past 240 hour(s))  Urine Culture     Status: None   Collection Time: 01/22/21  4:46 PM   Specimen: Urine, Clean Catch  Result Value Ref Range Status   Specimen Description   Final    URINE, CLEAN CATCH Performed at [facility], [place] [address] [place], [place] [address]    Special Requests   Final    NONE Performed at [facility], [place] [address], [place],  [address]    Culture   Final    NO GROWTH Performed at  [facility], [place] [address], [place], [place] [address]    Report Status 01/24/2021 FINAL  Final  Resp Panel by RT-PCR (Flu A&B, Covid) Nasopharyngeal Swab     Status: None   Collection Time: 01/22/21  5:08 PM   Specimen: Nasopharyngeal Swab; Nasopharyngeal(NP) swabs in vial transport medium  Result Value Ref Range Status   SARS Coronavirus 2 by RT PCR NEGATIVE NEGATIVE Final    Comment: (NOTE) SARS-CoV-2 target nucleic acids are NOT DETECTED.  The SARS-CoV-2 RNA is generally detectable in upper respiratory specimens during the acute phase of infection. The lowest concentration of SARS-CoV-2 viral copies this assay can detect is 138 copies/mL. A negative result does not preclude SARS-Cov-2 infection and should not be used as the sole basis for treatment or other patient management decisions. A negative result may occur with  improper specimen collection/handling, submission of specimen other than nasopharyngeal swab, presence of viral mutation(s) within the areas targeted by this assay, and inadequate number of viral copies(<138 copies/mL). A negative result must be combined with clinical observations, patient history, and epidemiological information. The expected result is Negative.  Fact Sheet for Patients:   EntrepreneurPulse.com.au  Fact Sheet for Healthcare Providers:  IncredibleEmployment.be  This test is no t yet approved or cleared by the [place] FDA and  has been authorized for detection and/or diagnosis of SARS-CoV-2 by FDA under an Emergency Use Authorization (EUA). This EUA will remain  in effect (meaning this test can be used) for the duration of the COVID-19 declaration under Section 564(b)(1) of the Act, 21 U.S.C.section 360bbb-3(b)(1), unless the authorization is terminated  or revoked sooner.       Influenza A by PCR NEGATIVE NEGATIVE Final   Influenza B by PCR NEGATIVE NEGATIVE Final    Comment: (NOTE) The Xpert Xpress SARS-CoV-2/FLU/RSV plus assay is intended as an aid in the diagnosis of influenza from Nasopharyngeal swab specimens and should not be used as a sole basis for treatment. Nasal washings and aspirates are unacceptable for Xpert Xpress SARS-CoV-2/FLU/RSV testing.  Fact Sheet for Patients: EntrepreneurPulse.com.au  Fact Sheet for Healthcare Providers: IncredibleEmployment.be  This test is not yet approved or cleared by the [place] FDA and has been authorized for detection and/or diagnosis of SARS-CoV-2 by FDA under an Emergency Use Authorization (EUA). This EUA will remain in effect (meaning this test can be used) for the duration of the COVID-19 declaration under Section 564(b)(1) of the Act, 21 U.S.C. section 360bbb-3(b)(1), unless the authorization is terminated or revoked.  Performed at [facility], [place] [address] [place], [place] [address]   Surgical pcr screen     Status: None   Collection Time: 01/23/21  9:47 AM   Specimen: Nasal Mucosa; Nasal Swab  Result Value Ref Range Status   MRSA, PCR NEGATIVE NEGATIVE Final   Staphylococcus aureus NEGATIVE NEGATIVE Final    Comment: (NOTE) The Xpert SA Assay (FDA approved for NASAL specimens in  patients 81 years of age and older), is one component of a comprehensive surveillance program. It is not intended to diagnose infection nor to guide or monitor treatment. Performed at [facility], [place] [address], [place], [place] [address]      Labs: BNP (last 3 results) No results for input(s): BNP in the last 8760 hours. Basic Metabolic Panel: Recent Labs  Lab 01/22/21 1152 01/23/21 0531 01/24/21 0443 01/25/21 0502  NA 137 134* 135 135  K 4.5 3.8 4.2 3.8  CL 104 102 103  103  CO2 21* 23 26 27   GLUCOSE 122* 110* 147* 96  BUN 24* 34* 33* 31*  CREATININE 0.94 0.96 0.77 0.72  CALCIUM 10.2 9.2 9.4 8.9    Liver Function Tests: Recent Labs  Lab 01/22/21 1152  AST 28  ALT 28  ALKPHOS 57  BILITOT 0.9  PROT 7.3  ALBUMIN 4.2    No results for input(s): LIPASE, AMYLASE in the last 168 hours. No results for input(s): AMMONIA in the last 168 hours. CBC: Recent Labs  Lab 01/22/21 1152 01/23/21 0531 01/24/21 0443 01/25/21 0502  WBC 8.9 12.0* 13.3* 10.8*  NEUTROABS 6.8  --   --   --   HGB 13.3 10.6* 10.2* 9.6*  HCT 41.2 33.0* 32.0* 30.4*  MCV 91.8 92.2 93.6 93.3  PLT 326 247 252 246    Cardiac Enzymes: No results for input(s): CKTOTAL, CKMB, CKMBINDEX, TROPONINI in the last 168 hours. BNP: Invalid input(s): POCBNP CBG: No results for input(s): GLUCAP in the last 168 hours. D-Dimer No results for input(s): DDIMER in the last 72 hours. Hgb A1c No results for input(s): HGBA1C in the last 72 hours. Lipid Profile No results for input(s): CHOL, HDL, LDLCALC, TRIG, CHOLHDL, LDLDIRECT in the last 72 hours. Thyroid function studies No results for input(s): TSH, T4TOTAL, T3FREE, THYROIDAB in the last 72 hours.  Invalid input(s): FREET3 Anemia work up No results for input(s): VITAMINB12, FOLATE, FERRITIN, TIBC, IRON, RETICCTPCT in the last 72 hours. Urinalysis    Component Value Date/Time   COLORURINE RED (A) 01/22/2021 1227   APPEARANCEUR  TURBID (A) 01/22/2021 1227   LABSPEC  01/22/2021 1227    TEST NOT REPORTED DUE TO COLOR INTERFERENCE OF URINE PIGMENT   PHURINE  01/22/2021 1227    TEST NOT REPORTED DUE TO COLOR INTERFERENCE OF URINE PIGMENT   GLUCOSEU (A) 01/22/2021 1227    TEST NOT REPORTED DUE TO COLOR INTERFERENCE OF URINE PIGMENT   HGBUR (A) 01/22/2021 1227    TEST NOT REPORTED DUE TO COLOR INTERFERENCE OF URINE PIGMENT   BILIRUBINUR (A) 01/22/2021 1227    TEST NOT REPORTED DUE TO COLOR INTERFERENCE OF URINE PIGMENT   KETONESUR (A) 01/22/2021 1227    TEST NOT REPORTED DUE TO COLOR INTERFERENCE OF URINE PIGMENT   PROTEINUR (A) 01/22/2021 1227    TEST NOT REPORTED DUE TO COLOR INTERFERENCE OF URINE PIGMENT   UROBILINOGEN 2.0 (H) 05/28/2015 0029   NITRITE (A) 01/22/2021 1227    TEST NOT REPORTED DUE TO COLOR INTERFERENCE OF URINE PIGMENT   LEUKOCYTESUR (A) 01/22/2021 1227    TEST NOT REPORTED DUE TO COLOR INTERFERENCE OF URINE PIGMENT   Sepsis Labs Invalid input(s): PROCALCITONIN,  WBC,  LACTICIDVEN Microbiology Recent Results (from the past 240 hour(s))  Urine Culture     Status: None   Collection Time: 01/22/21  4:46 PM   Specimen: Urine, Clean Catch  Result Value Ref Range Status   Specimen Description   Final    URINE, CLEAN CATCH Performed at [facility], [place] [address] [place], [place] [address]    Special Requests   Final    NONE Performed at [facility], [place] [address] [place], [place] [address]    Culture   Final    NO GROWTH Performed at [facility], [place] [address] [place], [place] [address]    Report Status 01/24/2021 FINAL  Final  Resp Panel by RT-PCR (Flu A&B, Covid) Nasopharyngeal Swab     Status: None  Collection Time: 01/22/21  5:08 PM   Specimen: Nasopharyngeal Swab; Nasopharyngeal(NP) swabs in vial transport medium  Result Value Ref Range Status   SARS Coronavirus 2 by RT PCR NEGATIVE NEGATIVE Final    Comment: (NOTE) SARS-CoV-2  target nucleic acids are NOT DETECTED.  The SARS-CoV-2 RNA is generally detectable in upper respiratory specimens during the acute phase of infection. The lowest concentration of SARS-CoV-2 viral copies this assay can detect is 138 copies/mL. A negative result does not preclude SARS-Cov-2 infection and should not be used as the sole basis for treatment or other patient management decisions. A negative result may occur with  improper specimen collection/handling, submission of specimen other than nasopharyngeal swab, presence of viral mutation(s) within the areas targeted by this assay, and inadequate number of viral copies(<138 copies/mL). A negative result must be combined with clinical observations, patient history, and epidemiological information. The expected result is Negative.  Fact Sheet for Patients:  EntrepreneurPulse.com.au  Fact Sheet for Healthcare Providers:  IncredibleEmployment.be  This test is no t yet approved or cleared by the [place] FDA and  has been authorized for detection and/or diagnosis of SARS-CoV-2 by FDA under an Emergency Use Authorization (EUA). This EUA will remain  in effect (meaning this test can be used) for the duration of the COVID-19 declaration under Section 564(b)(1) of the Act, 21 U.S.C.section 360bbb-3(b)(1), unless the authorization is terminated  or revoked sooner.       Influenza A by PCR NEGATIVE NEGATIVE Final   Influenza B by PCR NEGATIVE NEGATIVE Final    Comment: (NOTE) The Xpert Xpress SARS-CoV-2/FLU/RSV plus assay is intended as an aid in the diagnosis of influenza from Nasopharyngeal swab specimens and should not be used as a sole basis for treatment. Nasal washings and aspirates are unacceptable for Xpert Xpress SARS-CoV-2/FLU/RSV testing.  Fact Sheet for Patients: EntrepreneurPulse.com.au  Fact Sheet for Healthcare  Providers: IncredibleEmployment.be  This test is not yet approved or cleared by the [place] FDA and has been authorized for detection and/or diagnosis of SARS-CoV-2 by FDA under an Emergency Use Authorization (EUA). This EUA will remain in effect (meaning this test can be used) for the duration of the COVID-19 declaration under Section 564(b)(1) of the Act, 21 U.S.C. section 360bbb-3(b)(1), unless the authorization is terminated or revoked.  Performed at [facility], [place] [address], [place], [place] [address]   Surgical pcr screen     Status: None   Collection Time: 01/23/21  9:47 AM   Specimen: Nasal Mucosa; Nasal Swab  Result Value Ref Range Status   MRSA, PCR NEGATIVE NEGATIVE Final   Staphylococcus aureus NEGATIVE NEGATIVE Final    Comment: (NOTE) The Xpert SA Assay (FDA approved for NASAL specimens in patients 54 years of age and older), is one component of a comprehensive surveillance program. It is not intended to diagnose infection nor to guide or monitor treatment. Performed at [facility], [place] [address], [place], [place] [address]      Patient was seen and examined on the day of discharge and was found to be in stable condition. Time coordinating discharge: 25 minutes including assessment and coordination of care, as well as examination of the patient.   SIGNED:  Dessa Phi, DO [facility] 01/26/2021, 10:03 AM

## 2021-01-26 NOTE — TOC Transition Note (Addendum)
Transition of Care [facility]) - CM/SW Discharge Note   Patient Details  Name: Jesse [name] MRN: [id number] Date of Birth: 10/30/1927  Transition of Care [facility]) CM/SW Contact:  Ross Ludwig, LCSW Phone Number: 01/26/2021, 12:46 PM   Clinical Narrative:     CSW spoke to [name] at [facility].  Per [name], patient can return today as soon as she reviews the FL2 and DC summary.  CSW faxed DC summary, FL2, and HH orders to ALF at [phone].  CSW asked if patient's son would transport him or if EMS needs to be used for transport.  [name] said to check with patient's son, CSW attempted to call patient's son had to leave a message on voice mail, awaiting for a call back.  [name] will call CSW back once she reviews paperwork for patient.  1:15pm  CSW spoke to patient's son [name] via phone, he stated patient does need EMS transport back to ALF.  CSW contacted [place] at ALF and she is aware that patient will be arriving via EMS.  Per [name], she does not need a report called in from the nurse.   Final next level of care: Assisted Living ([facility]) Barriers to Discharge: Barriers Resolved   Patient Goals and CMS Choice Patient states their goals for this hospitalization and ongoing recovery are:: To return back to ALF. CMS Medicare.gov Compare Post Acute Care list provided to:: Patient Represenative (must comment) Choice offered to / list presented to : Adult Children, Patient  Discharge Placement                       Discharge Plan and Services In-house Referral: Clinical Social Work   Post Acute Care Choice: Home Health                    HH Arranged: OT, PT          Social Determinants of Health (SDOH) Interventions     Readmission Risk Interventions No flowsheet data found.

## 2021-01-26 NOTE — Progress Notes (Signed)
[facility] room [address] [facility]) Hospital Liaison note:  This patient is currently enrolled in [facility] outpatient-based Palliative Care. Will continue to follow for disposition.  Please call with any outpatient palliative questions or concerns.  Thank you, Lorelee Market, LPN [facility] Liaison [phone]

## 2021-01-26 NOTE — NC FL2 (Addendum)
[place] LEVEL OF CARE SCREENING TOOL     IDENTIFICATION  Patient Name: Jesse [name] Birthdate: 1928/02/10 Sex: male Admission Date (Current Location): 01/22/2021  [facility] and [place] Number:  Herbalist and Address:  [name],  [place], [place]      Provider Number: [id number]  Attending Physician Name and Address:  Dessa Phi, DO  Relative Name and Phone Number:  Larrie Kass [phone]  [phone]  Loni Dolly Daughter [phone]  [phone]    Current Level of Care: Hospital Recommended Level of Care: [place] Prior Approval Number:    Date Approved/Denied:   PASRR Number:    Discharge Plan: Domiciliary (Rest home) ([facility])    Current Diagnoses: Patient Active Problem List   Diagnosis Date Noted   Hematuria 01/23/2021   Palliative care by specialist    SBO (small bowel obstruction) ([place]) 12/12/2020   Abducens (sixth) nerve palsy, left 02/20/2020   Binocular vision disorder with diplopia 02/20/2020   Gross hematuria 08/15/2019   History of prostate cancer 08/15/2019   Anxiety 06/21/2019   Anemia due to chronic illness 06/20/2019   Pneumonia due to COVID-19 virus 06/20/2019   Displaced spiral fracture of shaft of right femur, initial encounter for open fracture type I or II ([place]) 06/20/2019   COVID-19 virus infection 06/19/2019   Goals of care, counseling/discussion    Hip fracture ([place]) 04/15/2019   Fall    Coronary artery disease involving native coronary artery of native heart without angina pectoris 11/20/2018   Thoracic aortic aneurysm without rupture ([place]) 44/96/7591   Chronic systolic heart failure ([place]) 11/20/2018   Duodenal ulcer with hemorrhage 05/05/2018   Hemorrhagic shock ([place]) 05/05/2018   AKI (acute kidney injury) ([place]) 05/05/2018   Orthostatic hypotension 05/04/2018   Symptomatic anemia 05/04/2018   Melena 05/04/2018   GI bleed  05/04/2018   Patella fracture 06/09/2015   Aftercare following surgery of the circulatory system, NEC 01/08/2013   Pain in limb- Left popliteal 12/25/2012   Aneurysm artery, 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[place]) 05/22/2012   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   Aneurysm of abdominal vessel (HCC) 02/21/2012   Iliac artery aneurysm, bilateral (HCC) 08/09/2011   Aneurysm of artery of lower extremity ([place]) 08/09/2011   HLD (hyperlipidemia) 01/16/2009   Essential hypertension 01/16/2009   CAD, ARTERY BYPASS GRAFT 01/16/2009   PERIPHERAL VASCULAR DISEASE 01/16/2009   ABDOMINAL AORTIC ANEURYSM REPAIR, HX OF 01/16/2009   Mixed hyperlipidemia 12/28/2008    Orientation RESPIRATION BLADDER Height & Weight     Self, Time, Situation, Place  Normal Continent Weight: 200 lb 13.4 oz (91.1 kg) Height:  6' (182.9 cm)  BEHAVIORAL SYMPTOMS/MOOD NEUROLOGICAL BOWEL NUTRITION STATUS      Continent Diet (Regular diet)  AMBULATORY STATUS COMMUNICATION OF NEEDS Skin   Supervision Verbally Surgical wounds                       Personal Care Assistance Level of Assistance  Bathing, Dressing, Feeding Bathing Assistance: Limited assistance Feeding assistance: Independent Dressing Assistance: Limited assistance     Functional Limitations Info  Sight, Speech, Hearing Sight Info: Adequate Hearing Info: Adequate Speech Info: Adequate    SPECIAL CARE FACTORS FREQUENCY  PT (By licensed PT), OT (By licensed OT)     PT Frequency: Minimum 2x a week OT Frequency: Minimum 2x a week            Contractures Contractures Info: Not present  Additional Factors Info  Code Status, Allergies, Psychotropic Code Status Info: DNR Allergies Info: Quinolones   Adhesive (Tape) Psychotropic Info: sertraline (ZOLOFT) tablet 50 mg         Current Medications (01/26/2021):  This is the current hospital active medication list Current Facility-Administered Medications  Medication Dose Route Frequency Provider Last Rate Last  Admin   0.9 %  sodium chloride infusion  250 mL Intravenous PRN Dessa Phi, DO       acetaminophen (TYLENOL) tablet 650 mg  650 mg Oral Q6H PRN Dessa Phi, DO   650 mg at 01/25/21 9983   Or   acetaminophen (TYLENOL) suppository 650 mg  650 mg Rectal Q6H PRN Dessa Phi, DO       carvedilol (COREG) tablet 3.125 mg  3.125 mg Oral BID WC Dessa Phi, DO   3.125 mg at 01/26/21 0748   finasteride (PROSCAR) tablet 5 mg  5 mg Oral Daily Dessa Phi, DO   5 mg at 01/26/21 0935   melatonin tablet 3 mg  3 mg Oral QHS Blount, Scarlette Shorts T, NP   3 mg at 01/25/21 2136   ondansetron (ZOFRAN) tablet 4 mg  4 mg Oral Q6H PRN Dessa Phi, DO       Or   ondansetron [facility]) injection 4 mg  4 mg Intravenous Q6H PRN Dessa Phi, DO       pantoprazole (PROTONIX) EC tablet 40 mg  40 mg Oral Daily Dessa Phi, DO   40 mg at 01/26/21 0935   phenazopyridine (PYRIDIUM) tablet 100 mg  100 mg Oral TID WC Dessa Phi, DO   100 mg at 01/26/21 0748   polyethylene glycol (MIRALAX / GLYCOLAX) packet 17 g  17 g Oral Daily PRN Dessa Phi, DO       polyvinyl alcohol (LIQUIFILM TEARS) 1.4 % ophthalmic solution 1 drop  1 drop Both Eyes PRN Dessa Phi, DO   1 drop at 01/26/21 0207   sertraline (ZOLOFT) tablet 50 mg  50 mg Oral Daily Dessa Phi, DO   50 mg at 01/26/21 0935   simvastatin (ZOCOR) tablet 40 mg  40 mg Oral q1800 Dessa Phi, DO   40 mg at 01/25/21 1800   sodium chloride flush (NS) 0.9 % injection 3 mL  3 mL Intravenous Q12H Dessa Phi, DO   3 mL at 01/26/21 3825   sodium chloride flush (NS) 0.9 % injection 3 mL  3 mL Intravenous PRN Dessa Phi, DO       spironolactone (ALDACTONE) tablet 12.5 mg  12.5 mg Oral Daily Dessa Phi, DO   12.5 mg at 01/26/21 0935   tamsulosin (FLOMAX) capsule 0.4 mg  0.4 mg Oral Daily Dessa Phi, DO   0.4 mg at 01/26/21 0539     Discharge Medications: TAKE these medications     acetaminophen 500 MG tablet Commonly known as:  TYLENOL Take 500 mg by mouth every 6 (six) hours as needed for mild pain or headache.    alendronate 70 MG tablet Commonly known as: FOSAMAX Take 70 mg by mouth every Saturday. Saturday    aspirin 81 MG tablet Take 1 tablet (81 mg total) by mouth daily.    carboxymethylcellulose 0.5 % Soln Commonly known as: REFRESH PLUS Apply 2 drops to eye at bedtime.    carvedilol 3.125 MG tablet Commonly known as: COREG TAKE 1 TABLET BY MOUTH 2 TIMES DAILY WITH A MEAL    docusate sodium 100 MG capsule Commonly known as: Colace Take 1 capsule (100  mg total) by mouth 2 (two) times daily.    Dulcolax 5 MG EC tablet Generic drug: bisacodyl Take 1 tablet (5 mg total) by mouth daily as needed for moderate constipation.    fenofibrate 160 MG tablet Take 1 tablet (160 mg total) by mouth daily.    finasteride 5 MG tablet Commonly known as: PROSCAR Take 5 mg by mouth daily.    melatonin 3 MG Tabs tablet Take 3 mg by mouth at bedtime as needed (sleep).    nitrofurantoin (macrocrystal-monohydrate) 100 MG capsule Commonly known as: Macrobid Take 1 capsule (100 mg total) by mouth at bedtime for 5 days.    OPTIVE 0.5-0.9 % ophthalmic solution Generic drug: carboxymethylcellul-glycerin Place 2 drops into both eyes at bedtime.    pantoprazole 40 MG tablet Commonly known as: PROTONIX Take 40 mg by mouth daily.    polyethylene glycol 17 g packet Commonly known as: MIRALAX / GLYCOLAX Take 17 g by mouth daily.    sertraline 50 MG tablet Commonly known as: ZOLOFT Take 50 mg by mouth daily.    simvastatin 40 MG tablet Commonly known as: ZOCOR Take 1 tablet (40 mg total) by mouth daily at 6 PM.    sodium chloride 0.65 % Soln nasal spray Commonly known as: OCEAN Place 2 sprays into both nostrils 2 (two) times daily.    spironolactone 25 MG tablet Commonly known as: ALDACTONE Take 12.5 mg by mouth daily.    tamsulosin 0.4 MG Caps capsule Commonly known as: FLOMAX Take 0.4 mg by mouth  daily.    Relevant Imaging Results:  Relevant Lab Results:   Additional Information SSN [id number]  Ross Ludwig, LCSW

## 2021-01-26 NOTE — NC FL2 (Deleted)
[place] LEVEL OF CARE SCREENING TOOL     IDENTIFICATION  Patient Name: Jesse [name] Birthdate: 1927-12-07 Sex: male Admission Date (Current Location): 01/22/2021  [facility] and [place] Number:  Herbalist and Address:  [facility],  [place], [place]      Provider Number: [id number]  Attending Physician Name and Address:  Dessa Phi, DO  Relative Name and Phone Number:  Larrie Kass [phone]  [phone]  Loni Dolly Daughter [phone]  [phone]    Current Level of Care: Hospital Recommended Level of Care: [place] Prior Approval Number:    Date Approved/Denied:   PASRR Number:    Discharge Plan: Domiciliary (Rest home) ([facility])    Current Diagnoses: Patient Active Problem List   Diagnosis Date Noted   Hematuria 01/23/2021   Palliative care by specialist    SBO (small bowel obstruction) ([place]) 12/12/2020   Abducens (sixth) nerve palsy, left 02/20/2020   Binocular vision disorder with diplopia 02/20/2020   Gross hematuria 08/15/2019   History of prostate cancer 08/15/2019   Anxiety 06/21/2019   Anemia due to chronic illness 06/20/2019   Pneumonia due to COVID-19 virus 06/20/2019   Displaced spiral fracture of shaft of right femur, initial encounter for open fracture type I or II ([place]) 06/20/2019   COVID-19 virus infection 06/19/2019   Goals of care, counseling/discussion    Hip fracture ([place]) 04/15/2019   Fall    Coronary artery disease involving native coronary artery of native heart without angina pectoris 11/20/2018   Thoracic aortic aneurysm without rupture ([place]) 16/96/7893   Chronic systolic heart failure ([place]) 11/20/2018   Duodenal ulcer with hemorrhage 05/05/2018   Hemorrhagic shock ([place]) 05/05/2018   AKI (acute kidney injury) ([place]) 05/05/2018   Orthostatic hypotension 05/04/2018   Symptomatic anemia 05/04/2018   Melena 05/04/2018   GI bleed  05/04/2018   Patella fracture 06/09/2015   Aftercare following surgery of the circulatory system, NEC 01/08/2013   Pain in limb- Left popliteal 12/25/2012   Aneurysm artery, 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[place]) 05/22/2012   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   Aneurysm of abdominal vessel (HCC) 02/21/2012   Iliac artery aneurysm, bilateral (HCC) 08/09/2011   Aneurysm of artery of lower extremity ([place]) 08/09/2011   HLD (hyperlipidemia) 01/16/2009   Essential hypertension 01/16/2009   CAD, ARTERY BYPASS GRAFT 01/16/2009   PERIPHERAL VASCULAR DISEASE 01/16/2009   ABDOMINAL AORTIC ANEURYSM REPAIR, HX OF 01/16/2009   Mixed hyperlipidemia 12/28/2008    Orientation RESPIRATION BLADDER Height & Weight     Self, Time, Situation, Place  Normal Continent Weight: 200 lb 13.4 oz (91.1 kg) Height:  6' (182.9 cm)  BEHAVIORAL SYMPTOMS/MOOD NEUROLOGICAL BOWEL NUTRITION STATUS      Continent Diet (Regular diet)  AMBULATORY STATUS COMMUNICATION OF NEEDS Skin   Supervision Verbally Surgical wounds                       Personal Care Assistance Level of Assistance  Bathing, Dressing, Feeding Bathing Assistance: Limited assistance Feeding assistance: Independent Dressing Assistance: Limited assistance     Functional Limitations Info  Sight, Speech, Hearing Sight Info: Adequate Hearing Info: Adequate Speech Info: Adequate    SPECIAL CARE FACTORS FREQUENCY  PT (By licensed PT), OT (By licensed OT)     PT Frequency: Minimum 2x a week OT Frequency: Minimum 2x a week            Contractures Contractures Info: Not present  Additional Factors Info  Code Status, Allergies, Psychotropic Code Status Info: DNR Allergies Info: Quinolones   Adhesive (Tape) Psychotropic Info: sertraline (ZOLOFT) tablet 50 mg         Current Medications (01/26/2021):  This is the current hospital active medication list Current Facility-Administered Medications  Medication Dose Route Frequency Provider Last Rate Last  Admin   0.9 %  sodium chloride infusion  250 mL Intravenous PRN Dessa Phi, DO       acetaminophen (TYLENOL) tablet 650 mg  650 mg Oral Q6H PRN Dessa Phi, DO   650 mg at 01/25/21 8119   Or   acetaminophen (TYLENOL) suppository 650 mg  650 mg Rectal Q6H PRN Dessa Phi, DO       carvedilol (COREG) tablet 3.125 mg  3.125 mg Oral BID WC Dessa Phi, DO   3.125 mg at 01/26/21 0748   finasteride (PROSCAR) tablet 5 mg  5 mg Oral Daily Dessa Phi, DO   5 mg at 01/26/21 0935   melatonin tablet 3 mg  3 mg Oral QHS Blount, Scarlette Shorts T, NP   3 mg at 01/25/21 2136   ondansetron (ZOFRAN) tablet 4 mg  4 mg Oral Q6H PRN Dessa Phi, DO       Or   ondansetron [facility]) injection 4 mg  4 mg Intravenous Q6H PRN Dessa Phi, DO       pantoprazole (PROTONIX) EC tablet 40 mg  40 mg Oral Daily Dessa Phi, DO   40 mg at 01/26/21 0935   phenazopyridine (PYRIDIUM) tablet 100 mg  100 mg Oral TID WC Dessa Phi, DO   100 mg at 01/26/21 0748   polyethylene glycol (MIRALAX / GLYCOLAX) packet 17 g  17 g Oral Daily PRN Dessa Phi, DO       polyvinyl alcohol (LIQUIFILM TEARS) 1.4 % ophthalmic solution 1 drop  1 drop Both Eyes PRN Dessa Phi, DO   1 drop at 01/26/21 0207   sertraline (ZOLOFT) tablet 50 mg  50 mg Oral Daily Dessa Phi, DO   50 mg at 01/26/21 0935   simvastatin (ZOCOR) tablet 40 mg  40 mg Oral q1800 Dessa Phi, DO   40 mg at 01/25/21 1800   sodium chloride flush (NS) 0.9 % injection 3 mL  3 mL Intravenous Q12H Dessa Phi, DO   3 mL at 01/26/21 1478   sodium chloride flush (NS) 0.9 % injection 3 mL  3 mL Intravenous PRN Dessa Phi, DO       spironolactone (ALDACTONE) tablet 12.5 mg  12.5 mg Oral Daily Dessa Phi, DO   12.5 mg at 01/26/21 0935   tamsulosin (FLOMAX) capsule 0.4 mg  0.4 mg Oral Daily Dessa Phi, DO   0.4 mg at 01/26/21 2956     Discharge Medications: TAKE these medications     acetaminophen 500 MG tablet Commonly known as:  TYLENOL Take 500 mg by mouth every 6 (six) hours as needed for mild pain or headache.    alendronate 70 MG tablet Commonly known as: FOSAMAX Take 70 mg by mouth every Saturday. Saturday    aspirin 81 MG tablet Take 1 tablet (81 mg total) by mouth daily.    carboxymethylcellulose 0.5 % Soln Commonly known as: REFRESH PLUS Apply 2 drops to eye at bedtime.    carvedilol 3.125 MG tablet Commonly known as: COREG TAKE 1 TABLET BY MOUTH 2 TIMES DAILY WITH A MEAL    docusate sodium 100 MG capsule Commonly known as: Colace Take 1 capsule (100  mg total) by mouth 2 (two) times daily.    Dulcolax 5 MG EC tablet Generic drug: bisacodyl Take 1 tablet (5 mg total) by mouth daily as needed for moderate constipation.    fenofibrate 160 MG tablet Take 1 tablet (160 mg total) by mouth daily.    finasteride 5 MG tablet Commonly known as: PROSCAR Take 5 mg by mouth daily.    melatonin 3 MG Tabs tablet Take 3 mg by mouth at bedtime as needed (sleep).    nitrofurantoin (macrocrystal-monohydrate) 100 MG capsule Commonly known as: Macrobid Take 1 capsule (100 mg total) by mouth at bedtime for 5 days.    OPTIVE 0.5-0.9 % ophthalmic solution Generic drug: carboxymethylcellul-glycerin Place 2 drops into both eyes at bedtime.    pantoprazole 40 MG tablet Commonly known as: PROTONIX Take 40 mg by mouth daily.    polyethylene glycol 17 g packet Commonly known as: MIRALAX / GLYCOLAX Take 17 g by mouth daily.    sertraline 50 MG tablet Commonly known as: ZOLOFT Take 50 mg by mouth daily.    simvastatin 40 MG tablet Commonly known as: ZOCOR Take 1 tablet (40 mg total) by mouth daily at 6 PM.    sodium chloride 0.65 % Soln nasal spray Commonly known as: OCEAN Place 2 sprays into both nostrils 2 (two) times daily.    spironolactone 25 MG tablet Commonly known as: ALDACTONE Take 12.5 mg by mouth daily.    tamsulosin 0.4 MG Caps capsule Commonly known as: FLOMAX Take 0.4 mg by mouth  daily.    Relevant Imaging Results:  Relevant Lab Results:   Additional Information SSN [id number]  Ross Ludwig, LCSW

## 2021-01-27 DIAGNOSIS — R278 Other lack of coordination: Secondary | ICD-10-CM | POA: Diagnosis not present

## 2021-01-28 DIAGNOSIS — R278 Other lack of coordination: Secondary | ICD-10-CM | POA: Diagnosis not present

## 2021-01-29 DIAGNOSIS — R278 Other lack of coordination: Secondary | ICD-10-CM | POA: Diagnosis not present

## 2021-01-29 DIAGNOSIS — F418 Other specified anxiety disorders: Secondary | ICD-10-CM | POA: Diagnosis not present

## 2021-01-29 DIAGNOSIS — R635 Abnormal weight gain: Secondary | ICD-10-CM | POA: Diagnosis not present

## 2021-01-29 DIAGNOSIS — D638 Anemia in other chronic diseases classified elsewhere: Secondary | ICD-10-CM | POA: Diagnosis not present

## 2021-01-29 DIAGNOSIS — R310 Gross hematuria: Secondary | ICD-10-CM | POA: Diagnosis not present

## 2021-02-01 DIAGNOSIS — R278 Other lack of coordination: Secondary | ICD-10-CM | POA: Diagnosis not present

## 2021-02-02 DIAGNOSIS — R278 Other lack of coordination: Secondary | ICD-10-CM | POA: Diagnosis not present

## 2021-02-03 ENCOUNTER — Ambulatory Visit: Payer: Medicare Other | Admitting: Vascular Surgery

## 2021-02-03 DIAGNOSIS — R278 Other lack of coordination: Secondary | ICD-10-CM | POA: Diagnosis not present

## 2021-02-05 DIAGNOSIS — R278 Other lack of coordination: Secondary | ICD-10-CM | POA: Diagnosis not present

## 2021-02-08 DIAGNOSIS — R278 Other lack of coordination: Secondary | ICD-10-CM | POA: Diagnosis not present

## 2021-02-09 DIAGNOSIS — R278 Other lack of coordination: Secondary | ICD-10-CM | POA: Diagnosis not present

## 2021-02-10 DIAGNOSIS — R278 Other lack of coordination: Secondary | ICD-10-CM | POA: Diagnosis not present

## 2021-02-11 DIAGNOSIS — R310 Gross hematuria: Secondary | ICD-10-CM | POA: Diagnosis not present

## 2021-02-11 DIAGNOSIS — N3000 Acute cystitis without hematuria: Secondary | ICD-10-CM | POA: Diagnosis not present

## 2021-02-15 ENCOUNTER — Other Ambulatory Visit: Payer: Self-pay

## 2021-02-15 ENCOUNTER — Inpatient Hospital Stay (HOSPITAL_COMMUNITY)
Admission: EM | Admit: 2021-02-15 | Discharge: 2021-02-22 | DRG: 699 | Disposition: A | Discharge status: Nursing Home (Includes ICF) | Payer: Medicare Other | Source: Skilled Nursing Facility | Attending: Internal Medicine | Admitting: Internal Medicine

## 2021-02-15 ENCOUNTER — Encounter (HOSPITAL_COMMUNITY): Payer: Self-pay | Admitting: Emergency Medicine

## 2021-02-15 DIAGNOSIS — M1990 Unspecified osteoarthritis, unspecified site: Secondary | ICD-10-CM | POA: Diagnosis present

## 2021-02-15 DIAGNOSIS — Z87891 Personal history of nicotine dependence: Secondary | ICD-10-CM

## 2021-02-15 DIAGNOSIS — K219 Gastro-esophageal reflux disease without esophagitis: Secondary | ICD-10-CM | POA: Diagnosis not present

## 2021-02-15 DIAGNOSIS — H9190 Unspecified hearing loss, unspecified ear: Secondary | ICD-10-CM | POA: Diagnosis present

## 2021-02-15 DIAGNOSIS — Z888 Allergy status to other drugs, medicaments and biological substances status: Secondary | ICD-10-CM

## 2021-02-15 DIAGNOSIS — Y842 Radiological procedure and radiotherapy as the cause of abnormal reaction of the patient, or of later complication, without mention of misadventure at the time of the procedure: Secondary | ICD-10-CM | POA: Diagnosis present

## 2021-02-15 DIAGNOSIS — I10 Essential (primary) hypertension: Secondary | ICD-10-CM | POA: Diagnosis not present

## 2021-02-15 DIAGNOSIS — E782 Mixed hyperlipidemia: Secondary | ICD-10-CM | POA: Diagnosis not present

## 2021-02-15 DIAGNOSIS — R5381 Other malaise: Secondary | ICD-10-CM | POA: Diagnosis not present

## 2021-02-15 DIAGNOSIS — I723 Aneurysm of iliac artery: Secondary | ICD-10-CM | POA: Diagnosis not present

## 2021-02-15 DIAGNOSIS — K8020 Calculus of gallbladder without cholecystitis without obstruction: Secondary | ICD-10-CM | POA: Diagnosis present

## 2021-02-15 DIAGNOSIS — N3041 Irradiation cystitis with hematuria: Secondary | ICD-10-CM | POA: Diagnosis not present

## 2021-02-15 DIAGNOSIS — B9689 Other specified bacterial agents as the cause of diseases classified elsewhere: Secondary | ICD-10-CM | POA: Diagnosis present

## 2021-02-15 DIAGNOSIS — Z7401 Bed confinement status: Secondary | ICD-10-CM | POA: Diagnosis not present

## 2021-02-15 DIAGNOSIS — J302 Other seasonal allergic rhinitis: Secondary | ICD-10-CM | POA: Diagnosis present

## 2021-02-15 DIAGNOSIS — Z79899 Other long term (current) drug therapy: Secondary | ICD-10-CM

## 2021-02-15 DIAGNOSIS — N3040 Irradiation cystitis without hematuria: Secondary | ICD-10-CM | POA: Diagnosis present

## 2021-02-15 DIAGNOSIS — Z974 Presence of external hearing-aid: Secondary | ICD-10-CM

## 2021-02-15 DIAGNOSIS — N419 Inflammatory disease of prostate, unspecified: Secondary | ICD-10-CM | POA: Diagnosis present

## 2021-02-15 DIAGNOSIS — I2510 Atherosclerotic heart disease of native coronary artery without angina pectoris: Secondary | ICD-10-CM | POA: Diagnosis present

## 2021-02-15 DIAGNOSIS — N3289 Other specified disorders of bladder: Secondary | ICD-10-CM | POA: Diagnosis present

## 2021-02-15 DIAGNOSIS — I5022 Chronic systolic (congestive) heart failure: Secondary | ICD-10-CM | POA: Diagnosis not present

## 2021-02-15 DIAGNOSIS — Z8616 Personal history of COVID-19: Secondary | ICD-10-CM | POA: Diagnosis not present

## 2021-02-15 DIAGNOSIS — Z923 Personal history of irradiation: Secondary | ICD-10-CM

## 2021-02-15 DIAGNOSIS — K529 Noninfective gastroenteritis and colitis, unspecified: Secondary | ICD-10-CM | POA: Diagnosis present

## 2021-02-15 DIAGNOSIS — Z66 Do not resuscitate: Secondary | ICD-10-CM | POA: Diagnosis present

## 2021-02-15 DIAGNOSIS — G4700 Insomnia, unspecified: Secondary | ICD-10-CM | POA: Diagnosis present

## 2021-02-15 DIAGNOSIS — D62 Acute posthemorrhagic anemia: Secondary | ICD-10-CM | POA: Diagnosis present

## 2021-02-15 DIAGNOSIS — I739 Peripheral vascular disease, unspecified: Secondary | ICD-10-CM | POA: Diagnosis present

## 2021-02-15 DIAGNOSIS — Z1619 Resistance to other specified beta lactam antibiotics: Secondary | ICD-10-CM | POA: Diagnosis present

## 2021-02-15 DIAGNOSIS — Z20822 Contact with and (suspected) exposure to covid-19: Secondary | ICD-10-CM | POA: Diagnosis not present

## 2021-02-15 DIAGNOSIS — R310 Gross hematuria: Secondary | ICD-10-CM

## 2021-02-15 DIAGNOSIS — Z8546 Personal history of malignant neoplasm of prostate: Secondary | ICD-10-CM

## 2021-02-15 DIAGNOSIS — Z7982 Long term (current) use of aspirin: Secondary | ICD-10-CM

## 2021-02-15 DIAGNOSIS — R319 Hematuria, unspecified: Secondary | ICD-10-CM | POA: Diagnosis not present

## 2021-02-15 DIAGNOSIS — R0689 Other abnormalities of breathing: Secondary | ICD-10-CM | POA: Diagnosis not present

## 2021-02-15 DIAGNOSIS — Z951 Presence of aortocoronary bypass graft: Secondary | ICD-10-CM

## 2021-02-15 DIAGNOSIS — Z1611 Resistance to penicillins: Secondary | ICD-10-CM | POA: Diagnosis not present

## 2021-02-15 DIAGNOSIS — I110 Hypertensive heart disease with heart failure: Secondary | ICD-10-CM | POA: Diagnosis present

## 2021-02-15 DIAGNOSIS — Z8249 Family history of ischemic heart disease and other diseases of the circulatory system: Secondary | ICD-10-CM

## 2021-02-15 DIAGNOSIS — F419 Anxiety disorder, unspecified: Secondary | ICD-10-CM | POA: Diagnosis present

## 2021-02-15 DIAGNOSIS — Z7983 Long term (current) use of bisphosphonates: Secondary | ICD-10-CM

## 2021-02-15 DIAGNOSIS — I252 Old myocardial infarction: Secondary | ICD-10-CM

## 2021-02-15 DIAGNOSIS — Z91048 Other nonmedicinal substance allergy status: Secondary | ICD-10-CM

## 2021-02-15 LAB — CBC WITH DIFFERENTIAL/PLATELET
Abs Immature Granulocytes: 0.03 10*3/uL (ref 0.00–0.07)
Basophils Absolute: 0.1 10*3/uL (ref 0.0–0.1)
Basophils Relative: 1 %
Eosinophils Absolute: 0.3 10*3/uL (ref 0.0–0.5)
Eosinophils Relative: 4 %
HCT: 32.8 % — ABNORMAL LOW (ref 39.0–52.0)
Hemoglobin: 10.6 g/dL — ABNORMAL LOW (ref 13.0–17.0)
Immature Granulocytes: 0 %
Lymphocytes Relative: 10 %
Lymphs Abs: 0.7 10*3/uL (ref 0.7–4.0)
MCH: 29.7 pg (ref 26.0–34.0)
MCHC: 32.3 g/dL (ref 30.0–36.0)
MCV: 91.9 fL (ref 80.0–100.0)
Monocytes Absolute: 0.5 10*3/uL (ref 0.1–1.0)
Monocytes Relative: 7 %
Neutro Abs: 5.7 10*3/uL (ref 1.7–7.7)
Neutrophils Relative %: 78 %
Platelets: 284 10*3/uL (ref 150–400)
RBC: 3.57 MIL/uL — ABNORMAL LOW (ref 4.22–5.81)
RDW: 14.3 % (ref 11.5–15.5)
WBC: 7.4 10*3/uL (ref 4.0–10.5)
nRBC: 0 % (ref 0.0–0.2)

## 2021-02-15 LAB — URINALYSIS, MICROSCOPIC (REFLEX)
RBC / HPF: >50 RBC/hpf (ref 0–5)
Squamous Epithelial / HPF: NONE SEEN (ref 0–5)

## 2021-02-15 LAB — BASIC METABOLIC PANEL
Anion gap: 8 (ref 5–15)
BUN: 26 mg/dL — ABNORMAL HIGH (ref 8–23)
CO2: 22 mmol/L (ref 22–32)
Calcium: 9 mg/dL (ref 8.9–10.3)
Chloride: 109 mmol/L (ref 98–111)
Creatinine, Ser: 0.94 mg/dL (ref 0.61–1.24)
GFR, Estimated: >60 mL/min (ref >60)
Glucose, Bld: 142 mg/dL — ABNORMAL HIGH (ref 70–99)
Potassium: 3.8 mmol/L (ref 3.5–5.1)
Sodium: 139 mmol/L (ref 135–145)

## 2021-02-15 LAB — URINALYSIS, ROUTINE W REFLEX MICROSCOPIC

## 2021-02-15 MED ORDER — LIDOCAINE HCL URETHRAL/MUCOSAL 2 % EX GEL
1.0000 "application " | Freq: Once | CUTANEOUS | Status: AC
  Administered 2021-02-15: 1 via URETHRAL
  Filled 2021-02-15: qty 11

## 2021-02-15 MED ORDER — ZOLPIDEM TARTRATE 5 MG PO TABS
5.0000 mg | ORAL_TABLET | Freq: Every evening | ORAL | Status: DC | PRN
  Administered 2021-02-20 – 2021-02-22 (×3): 5 mg via ORAL
  Filled 2021-02-15 (×3): qty 1

## 2021-02-15 MED ORDER — ACETAMINOPHEN 325 MG PO TABS
650.0000 mg | ORAL_TABLET | Freq: Once | ORAL | Status: AC
  Administered 2021-02-16: 650 mg via ORAL
  Filled 2021-02-15: qty 2

## 2021-02-15 MED ORDER — MELATONIN 5 MG PO TABS
5.0000 mg | ORAL_TABLET | Freq: Every day | ORAL | Status: DC
  Administered 2021-02-16 – 2021-02-22 (×8): 5 mg via ORAL
  Filled 2021-02-15 (×7): qty 1

## 2021-02-15 NOTE — ED Provider Notes (Signed)
[facility] DEPT Provider Note   CSN: [id number] Arrival date & time: 02/15/21  1341     History Chief Complaint  Patient presents with   Hematuria    Jesse [name] is a 87 y.o. male.  HPI     87 y.o. male with medical history significant of AAA, prostate cancer status postradiation therapy, coronary artery disease, GERD, hyperlipidemia, hypertension comes in a chief complaint of bloody urine.  Patient had recent admission for gross hematuria.  Urology service cauterized the patient.  He is now about 10 days postop.  Reports that he has had a little bit of bleeding ever since the procedure, but more recently he has had slightly more blood in the urine.  He is also having small clots come out.  He does not think he is retaining any urine.  Review of system is negative for any fevers, chills.  Past Medical History:  Diagnosis Date   AAA (abdominal aortic aneurysm) ([place])    Arthritis    Cancer ([place]) 2010   prostate ( 40 Txs. of radiation treatments)   Coronary artery disease    Environmental allergies    GERD (gastroesophageal reflux disease)    History of prostate cancer 2010   HOH (hard of hearing)    wears bilateral hearing aids   Hyperlipidemia    Hypertension    Kidney stone 1968   Myocardial infarction ([place]) 1992   Pneumonia    hx of   RBBB     Patient Active Problem List   Diagnosis Date Noted   GERD without esophagitis 02/15/2021   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   Gross hematuria 01/23/2021   Palliative care by specialist    SBO (small bowel obstruction) ([place]) 12/12/2020   Abducens (sixth) nerve palsy, left 02/20/2020   Binocular vision disorder with diplopia 02/20/2020   History of prostate cancer 08/15/2019   Anxiety 06/21/2019   Anemia due to chronic illness 06/20/2019   Pneumonia due to COVID-19 virus 06/20/2019   Displaced spiral fracture of shaft of right femur, initial encounter for open fracture type I or II ([place])  06/20/2019   COVID-19 virus infection 06/19/2019   Goals of care, counseling/discussion    Hip fracture ([place]) 04/15/2019   Fall    Coronary artery disease involving native coronary artery of native heart without angina pectoris 11/20/2018   Thoracic aortic aneurysm without rupture ([place]) [id number]   Chronic systolic heart failure ([place]) 11/20/2018   Duodenal ulcer with hemorrhage 05/05/2018   Hemorrhagic shock ([place]) 05/05/2018   AKI (acute kidney injury) ([place]) 05/05/2018   Orthostatic hypotension 05/04/2018   Anemia associated with acute blood loss 05/04/2018   Melena 05/04/2018   GI bleed 05/04/2018   Patella fracture 06/09/2015   Aftercare following surgery of the circulatory system, NEC 01/08/2013   Pain in limb- Left popliteal 12/25/2012   Aneurysm artery, 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[place]) 05/22/2012   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   Aneurysm of abdominal vessel ([place]) 02/21/2012   Iliac artery aneurysm, bilateral ([place]) 08/09/2011   Aneurysm of artery of lower extremity ([place]) 08/09/2011   HLD (hyperlipidemia) 01/16/2009   Essential hypertension 01/16/2009   CAD, ARTERY BYPASS GRAFT 01/16/2009   PERIPHERAL VASCULAR DISEASE 01/16/2009   ABDOMINAL AORTIC ANEURYSM REPAIR, HX OF 01/16/2009   Mixed hyperlipidemia 12/28/2008    Past Surgical History:  Procedure Laterality Date   ABDOMINAL AORTIC ANEURYSM REPAIR  08-13-1993   Dr Cameron Sprang   CARDIAC CATHETERIZATION     COLONOSCOPY W/ POLYPECTOMY  CORONARY ARTERY BYPASS GRAFT  03-01-1991   Dr. Servando Snare   CYSTOSCOPY WITH FULGERATION N/A 01/23/2021   Procedure: CYSTOSCOPY WITH FULGERATION;  Surgeon: Lucas Mallow, MD;  Location: [facility];  Service: Urology;  Laterality: N/A;   ESOPHAGOGASTRODUODENOSCOPY (EGD) WITH PROPOFOL N/A 05/04/2018   Procedure: ESOPHAGOGASTRODUODENOSCOPY (EGD) WITH PROPOFOL;  Surgeon: [name], MD;  Location: [facility];  Service: Gastroenterology;  Laterality: N/A;   EYE SURGERY     Detached retina (Left  eye); bilateral cataract removal   FALSE ANEURYSM REPAIR  05/07/2012   Procedure: REPAIR FALSE ANEURYSM;  Surgeon: Rosetta Posner, MD;  [facility];  Service: Vascular;  Laterality: Left;  Repair of Left Femoral Artery Aneurysm   HERNIA REPAIR     HOT HEMOSTASIS N/A 05/04/2018   Procedure: HOT HEMOSTASIS (ARGON PLASMA COAGULATION/BICAP);  Surgeon: [name], MD;  Location: [facility];  Service: Gastroenterology;  Laterality: N/A;   INTRAMEDULLARY (IM) NAIL INTERTROCHANTERIC Left 04/16/2019   Procedure: INTRAMEDULLARY (IM) NAIL INTERTROCHANTRIC;  Surgeon: Rod Can, MD;  Location: [facility];  Service: Orthopedics;  Laterality: Left;   INTRAMEDULLARY (IM) NAIL INTERTROCHANTERIC Right 06/17/2019   Procedure: RIGHT INTRAMEDULLARY (IM) NAIL INTERTROCHANTRIC;  Surgeon: Rod Can, MD;  Location: [facility];  Service: Orthopedics;  Laterality: Right;   IR NASO G TUBE PLC W/FL W/RAD  12/15/2020   ORIF PATELLA Right 06/09/2015   Procedure: OPEN REDUCTION INTERNAL (ORIF) FIXATION PATELLA;  Surgeon: Marybelle Killings, MD;  Location: [facility];  Service: Orthopedics;  Laterality: Right;   repair of bowel obstruction  1999   Dr. Dalbert Batman   repair of ventral hernia  04-20-1994   P. Cameron Sprang MD   resection femoral artery  03/19/2012   Dr. [name] [place]   [place] INJECTION  05/04/2018   Procedure: SUBMUCOSAL INJECTION;  Surgeon: [name], MD;  Location: [facility];  Service: Gastroenterology;;   TRANSURETHRAL RESECTION OF BLADDER TUMOR N/A 08/18/2019   Procedure: cysto fulgeration clot evacuation;  Surgeon: Cleon Gustin, MD;  Location: [facility];  Service: Urology;  Laterality: N/A;       Family History  Problem Relation Age of Onset   Cancer Mother    Cancer Father    Varicose Veins Father    Cancer Sister    Diabetes Sister    Heart attack Sister     Social History   Tobacco Use   Smoking status: Former    Years: 20.00    Types: Cigarettes    Quit date: 07/25/1968    Years since  quitting: 52.6   Smokeless tobacco: Never  Vaping Use   Vaping Use: Never used  Substance Use Topics   Alcohol use: No   Drug use: No    Home Medications Prior to Admission medications   Medication Sig Start Date End Date Taking? Authorizing Provider  acetaminophen (TYLENOL) 500 MG tablet Take 500 mg by mouth every 6 (six) hours as needed for mild pain or headache.   Yes [provider]  alendronate (FOSAMAX) 70 MG tablet Take 70 mg by mouth every Saturday. 08/14/19  Yes [provider]  bisacodyl (DULCOLAX) 5 MG EC tablet Take 1 tablet (5 mg total) by mouth daily as needed for moderate constipation. 12/25/20 12/25/21 Yes Lavina Hamman, MD  carboxymethylcellulose (REFRESH PLUS) 0.5 % SOLN Place 2 drops into both eyes at bedtime.   Yes [provider]  carvedilol (COREG) 3.125 MG tablet TAKE 1 TABLET BY MOUTH 2 TIMES DAILY WITH A MEAL Patient taking  differently: Take 3.125 mg by mouth 2 (two) times daily with a meal. 12/25/20  Yes Lavina Hamman, MD  cephALEXin (KEFLEX) 500 MG capsule Take 500 mg by mouth 3 (three) times daily.   Yes [provider]  docusate sodium (COLACE) 100 MG capsule Take 1 capsule (100 mg total) by mouth 2 (two) times daily. 12/25/20 12/25/21 Yes Lavina Hamman, MD  fenofibrate 160 MG tablet Take 1 tablet (160 mg total) by mouth daily. 05/29/19  Yes [name], MD  finasteride (PROSCAR) 5 MG tablet Take 5 mg by mouth daily. 02/10/20  Yes [provider]  Lactobacillus (ACIDOPHILUS PROBIOTIC PO) Take 1 capsule by mouth 2 (two) times daily.   Yes [provider]  melatonin 3 MG TABS tablet Take 3 mg by mouth at bedtime as needed (sleep).   Yes [provider]  pantoprazole (PROTONIX) 40 MG tablet Take 40 mg by mouth daily.   Yes [provider]  polyethylene glycol (MIRALAX / GLYCOLAX) 17 g packet Take 17 g by mouth daily. 12/26/20  Yes Lavina Hamman, MD  sertraline (ZOLOFT) 50 MG tablet Take 50 mg by  mouth daily. 12/04/20  Yes [provider]  simvastatin (ZOCOR) 40 MG tablet Take 1 tablet (40 mg total) by mouth daily at 6 PM. 05/29/19  Yes [name], MD  sodium chloride (OCEAN) 0.65 % SOLN nasal spray Place 2 sprays into both nostrils 2 (two) times daily.   Yes [provider]  spironolactone (ALDACTONE) 25 MG tablet Take 12.5 mg by mouth daily. 12/04/20  Yes [provider]  tamsulosin (FLOMAX) 0.4 MG CAPS capsule Take 0.4 mg by mouth at bedtime. 02/10/20  Yes [provider]  aspirin 81 MG tablet Take 1 tablet (81 mg total) by mouth daily. Patient not taking: No sig reported 08/26/19   Terrilee Croak, MD    Allergies    Quinolones and Adhesive [tape]  Review of Systems   Review of Systems  Constitutional:  Positive for activity change. Negative for chills and fever.  Gastrointestinal:  Positive for abdominal pain. Negative for diarrhea, nausea and vomiting.  Genitourinary:  Positive for hematuria.  Hematological:  Does not bruise/bleed easily.  All other systems reviewed and are negative.  Physical Exam Updated Vital Signs BP 116/74 (BP Location: Left Arm)   Pulse 71   Temp 98.9 F (37.2 C) (Oral)   Resp 16   Ht 6' (1.829 m)   Wt 78.6 kg   SpO2 98%   BMI 23.50 kg/m   Physical Exam Vitals and nursing note reviewed.  Constitutional:      Appearance: He is well-developed.  HENT:     Head: Atraumatic.  Cardiovascular:     Rate and Rhythm: Normal rate.  Pulmonary:     Effort: Pulmonary effort is normal.  Abdominal:     General: There is distension.     Tenderness: There is no abdominal tenderness.  Musculoskeletal:     Cervical back: Neck supple.  Skin:    General: Skin is warm.  Neurological:     Mental Status: He is alert and oriented to person, place, and time.    ED Results / Procedures / Treatments   Labs (all labs ordered are listed, but only abnormal results are displayed) Labs Reviewed  CBC WITH  DIFFERENTIAL/PLATELET - Abnormal; Notable for the following components:      Result Value   RBC 3.57 (*)    Hemoglobin 10.6 (*)    HCT 32.8 (*)  All other components within normal limits  BASIC METABOLIC PANEL - Abnormal; Notable for the following components:   Glucose, Bld 142 (*)    BUN 26 (*)    All other components within normal limits  URINALYSIS, ROUTINE W REFLEX MICROSCOPIC - Abnormal; Notable for the following components:   Color, Urine RED (*)    APPearance TURBID (*)    Glucose, UA   (*)    Value: TEST NOT REPORTED DUE TO COLOR INTERFERENCE OF URINE PIGMENT   Hgb urine dipstick   (*)    Value: TEST NOT REPORTED DUE TO COLOR INTERFERENCE OF URINE PIGMENT   Bilirubin Urine   (*)    Value: TEST NOT REPORTED DUE TO COLOR INTERFERENCE OF URINE PIGMENT   Ketones, ur   (*)    Value: TEST NOT REPORTED DUE TO COLOR INTERFERENCE OF URINE PIGMENT   Protein, ur   (*)    Value: TEST NOT REPORTED DUE TO COLOR INTERFERENCE OF URINE PIGMENT   Nitrite   (*)    Value: TEST NOT REPORTED DUE TO COLOR INTERFERENCE OF URINE PIGMENT   Leukocytes,Ua   (*)    Value: TEST NOT REPORTED DUE TO COLOR INTERFERENCE OF URINE PIGMENT   All other components within normal limits  URINALYSIS, MICROSCOPIC (REFLEX) - Abnormal; Notable for the following components:   Bacteria, UA RARE (*)    All other components within normal limits  BASIC METABOLIC PANEL - Abnormal; Notable for the following components:   Glucose, Bld 107 (*)    All other components within normal limits  CBC - Abnormal; Notable for the following components:   WBC 10.8 (*)    RBC 3.49 (*)    Hemoglobin 10.2 (*)    HCT 33.1 (*)    All other components within normal limits  RENAL FUNCTION PANEL - Abnormal; Notable for the following components:   Glucose, Bld 119 (*)    Phosphorus 2.3 (*)    Albumin 3.4 (*)    All other components within normal limits  CBC - Abnormal; Notable for the following components:   RBC 3.42 (*)     Hemoglobin 10.2 (*)    HCT 33.0 (*)    All other components within normal limits  IRON AND TIBC - Abnormal; Notable for the following components:   Iron 15 (*)    Saturation Ratios 5 (*)    All other components within normal limits  RETICULOCYTES - Abnormal; Notable for the following components:   RBC. 3.38 (*)    All other components within normal limits  SARS CORONAVIRUS 2 (TAT 6-24 HRS)  CULTURE, BLOOD (ROUTINE X 2)  CULTURE, BLOOD (ROUTINE X 2)  URINE CULTURE  PROTIME-INR  APTT  MAGNESIUM  VITAMIN B12  FOLATE  FERRITIN  CBC    EKG None  Radiology No results found.  Procedures Procedures   Medications Ordered in ED Medications  melatonin tablet 5 mg (5 mg Oral Given 02/16/21 2110)  zolpidem (AMBIEN) tablet 5 mg (has no administration in time range)  carvedilol (COREG) tablet 3.125 mg (3.125 mg Oral Given 02/17/21 1730)  fenofibrate tablet 160 mg (160 mg Oral Given 02/17/21 0913)  simvastatin (ZOCOR) tablet 40 mg (40 mg Oral Given 02/17/21 1730)  spironolactone (ALDACTONE) tablet 12.5 mg (12.5 mg Oral Given 02/17/21 0912)  sertraline (ZOLOFT) tablet 50 mg (50 mg Oral Given 02/17/21 0909)  docusate sodium (COLACE) capsule 100 mg (100 mg Oral Patient Refused/Not Given 02/17/21 0909)  lactobacillus (FLORANEX/LACTINEX) granules (1 g  Oral Patient Refused/Not Given 02/17/21 0910)  pantoprazole (PROTONIX) EC tablet 40 mg (40 mg Oral Given 02/17/21 0909)  polyethylene glycol (MIRALAX / GLYCOLAX) packet 17 g (17 g Oral Patient Refused/Not Given 02/17/21 0907)  finasteride (PROSCAR) tablet 5 mg (5 mg Oral Given 02/17/21 0910)  tamsulosin (FLOMAX) capsule 0.4 mg (0.4 mg Oral Given 02/16/21 2110)  sodium chloride (OCEAN) 0.65 % nasal spray 2 spray (2 sprays Each Nare Given 02/17/21 0913)  polyvinyl alcohol (LIQUIFILM TEARS) 1.4 % ophthalmic solution 2 drop (2 drops Both Eyes Given 02/16/21 2111)  acetaminophen (TYLENOL) tablet 650 mg (650 mg Oral Given 02/16/21 1611)    Or  acetaminophen  (TYLENOL) suppository 650 mg ( Rectal See Alternative 02/16/21 1611)  HYDROcodone-acetaminophen (NORCO/VICODIN) 5-325 MG per tablet 1-2 tablet (1 tablet Oral Given 02/17/21 1413)  morphine 2 MG/ML injection 2 mg (has no administration in time range)  senna-docusate (Senokot-S) tablet 1 tablet (has no administration in time range)  ondansetron (ZOFRAN) tablet 4 mg (has no administration in time range)    Or  ondansetron (ZOFRAN) injection 4 mg (has no administration in time range)  dextrose 5 %-0.45 % sodium chloride infusion (0 mLs Intravenous Stopped 02/16/21 1522)  bisacodyl (DULCOLAX) suppository 10 mg (has no administration in time range)  Chlorhexidine Gluconate Cloth 2 % PADS 6 each (6 each Topical Given 02/17/21 1026)  cefTRIAXone (ROCEPHIN) 1 g in sodium chloride 0.9 % 100 mL IVPB (1 g Intravenous New Bag/Given 02/17/21 1737)  ferric gluconate (FERRLECIT) 250 mg in sodium chloride 0.9 % 250 mL IVPB (has no administration in time range)  lidocaine (XYLOCAINE) 2 % jelly 1 application (1 application Urethral Given 02/15/21 1709)  acetaminophen (TYLENOL) tablet 650 mg (650 mg Oral Given 02/16/21 0345)    ED Course  I have reviewed the triage vital signs and the nursing notes.  Pertinent labs & imaging results that were available during my care of the patient were reviewed by me and considered in my medical decision making (see chart for details).  Clinical Course as of 02/17/21 2113  Mon Feb 15, 2021  1634 Pt has gross hematuria on reassessment. Spoke w/ Dr. Claudia Desanctis -urology - she will see the patient and likely insert foley catheter.  [AN]    Clinical Course User Index [AN] Varney Biles, MD   MDM Rules/Calculators/A&P                            87 year old comes in a chief complaint of bloody urine.  He does not appear that he is having urinary retention at this time.  Bladder scan ordered.  He is comfortable, just reports bloody urine that has persisted since his cauterization  couple weeks back.  Bleeding has worsened more recently.  Patient is on antibiotics for prostate infection.  We will get bladder scan, basic labs and monitor the patient in the ER. Given that he has prostate infection that is being treated right now, probably best not to put in a new Foley catheter unless absolutely needed.  Final Clinical Impression(s) / ED Diagnoses Final diagnoses:  Gross hematuria    Rx / DC Orders ED Discharge Orders     None        Varney Biles, MD 02/17/21 2113

## 2021-02-15 NOTE — Consult Note (Signed)
I have been asked to see the patient by Dr. Varney Biles, for evaluation and management of gross hematuria.  History of present illness: 87 yo man with history of Gleason 4+3 prostate cancer status post Lupron therapy and external beam radiation therapy followed by Dr. Milford Cage.  Has a history of gross hematuria for which he was hospitalized in the past for irrigation and for cystoscopic fulguration of vessels in the prostatic urethra.  Has known dystrophic calcifications in the prostatic urethra and bladder neck area.  He also has a past medical history of coronary artery disease on baby aspirin.  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  He comes in today with several days of gross hematuria passing clots.   Review of systems: A 12 point comprehensive review of systems was obtained and is negative unless otherwise stated in the history of present illness.  Patient Active Problem List   Diagnosis Date Noted   Hematuria 01/23/2021   Palliative care by specialist    SBO (small bowel obstruction) ([place]) 12/12/2020   Abducens (sixth) nerve palsy, left 02/20/2020   Binocular vision disorder with diplopia 02/20/2020   Gross hematuria 08/15/2019   History of prostate cancer 08/15/2019   Anxiety 06/21/2019   Anemia due to chronic illness 06/20/2019   Pneumonia due to COVID-19 virus 06/20/2019   Displaced spiral fracture of shaft of right femur, initial encounter for open fracture type I or II ([place]) 06/20/2019   COVID-19 virus infection 06/19/2019   Goals of care, counseling/discussion    Hip fracture ([place]) 04/15/2019   Fall    Coronary artery disease involving native coronary artery of native heart without angina pectoris 11/20/2018   Thoracic aortic aneurysm without rupture ([place]) [id number]   Chronic systolic heart failure ([place]) 11/20/2018   Duodenal ulcer with hemorrhage  05/05/2018   Hemorrhagic shock ([place]) 05/05/2018   AKI (acute kidney injury) ([place]) 05/05/2018   Orthostatic hypotension 05/04/2018   Symptomatic anemia 05/04/2018   Melena 05/04/2018   GI bleed 05/04/2018   Patella fracture 06/09/2015   Aftercare following surgery of the circulatory system, NEC 01/08/2013   Pain in limb- Left popliteal 12/25/2012   Aneurysm artery, femoral ([place]) 05/22/2012   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   Current Outpatient Medications on File Prior to Encounter  Medication Sig Dispense Refill   acetaminophen (TYLENOL) 500 MG tablet Take 500 mg by mouth every 6 (six) hours as needed for mild pain or headache.     alendronate (FOSAMAX) 70 MG tablet Take 70 mg by mouth every Saturday. Saturday     aspirin 81 MG tablet Take 1 tablet (81 mg total) by mouth daily. 30 tablet    bisacodyl (DULCOLAX) 5 MG EC tablet Take 1 tablet (5 mg total) by mouth daily as needed for moderate constipation. 30 tablet 1   carboxymethylcellul-glycerin (OPTIVE) 0.5-0.9 % ophthalmic solution Place 2 drops into both eyes at bedtime.     carboxymethylcellulose (REFRESH PLUS) 0.5 % SOLN  Apply 2 drops to eye at bedtime.     carvedilol (COREG) 3.125 MG tablet TAKE 1 TABLET BY MOUTH 2 TIMES DAILY WITH A MEAL 60 tablet 0   docusate sodium (COLACE) 100 MG capsule Take 1 capsule (100 mg total) by mouth 2 (two) times daily. 60 capsule 2   fenofibrate 160 MG tablet Take 1 tablet (160 mg total) by mouth daily. 90 tablet 1    finasteride (PROSCAR) 5 MG tablet Take 5 mg by mouth daily.     melatonin 3 MG TABS tablet Take 3 mg by mouth at bedtime as needed (sleep).     pantoprazole (PROTONIX) 40 MG tablet Take 40 mg by mouth daily.     polyethylene glycol (MIRALAX / GLYCOLAX) 17 g packet Take 17 g by mouth daily. 14 each 0   sertraline (ZOLOFT) 50 MG tablet Take 50 mg by mouth daily.     simvastatin (ZOCOR) 40 MG tablet Take 1 tablet (40 mg total) by mouth daily at 6 PM. 90 tablet 1   sodium chloride (OCEAN) 0.65 % SOLN nasal spray Place 2 sprays into both nostrils 2 (two) times daily.     spironolactone (ALDACTONE) 25 MG tablet Take 12.5 mg by mouth daily.     tamsulosin (FLOMAX) 0.4 MG CAPS capsule Take 0.4 mg by mouth daily.      Past Medical History:  Diagnosis Date   AAA (abdominal aortic aneurysm) ([place])    Arthritis    Cancer ([place]) 2010   prostate ( 40 Txs. of radiation treatments)   Coronary artery disease    Environmental allergies    GERD (gastroesophageal reflux disease)    History of prostate cancer 2010   HOH (hard of hearing)    wears bilateral hearing aids   Hyperlipidemia    Hypertension    Kidney stone 1968   Myocardial infarction ([place]) 1992   Pneumonia    hx of   RBBB     Past Surgical History:  Procedure Laterality Date   ABDOMINAL AORTIC ANEURYSM REPAIR  08-13-1993   Dr Cameron Sprang   CARDIAC CATHETERIZATION     COLONOSCOPY W/ POLYPECTOMY     CORONARY ARTERY BYPASS GRAFT  03-01-1991   Dr. Servando Snare   CYSTOSCOPY WITH FULGERATION N/A 01/23/2021   Procedure: Consuela Mimes WITH FULGERATION;  Surgeon: Lucas Mallow, MD;  Location: [facility];  Service: Urology;  Laterality: N/A;   ESOPHAGOGASTRODUODENOSCOPY (EGD) WITH PROPOFOL N/A 05/04/2018   Procedure: ESOPHAGOGASTRODUODENOSCOPY (EGD) WITH PROPOFOL;  Surgeon: [name], MD;  Location: [facility];  Service: Gastroenterology;  Laterality: N/A;   EYE SURGERY     Detached retina (Left eye); bilateral cataract removal   FALSE  ANEURYSM REPAIR  05/07/2012   Procedure: REPAIR FALSE ANEURYSM;  Surgeon: Rosetta Posner, MD;  Location: [facility];  Service: Vascular;  Laterality: Left;  Repair of Left Femoral Artery Aneurysm   HERNIA REPAIR     HOT HEMOSTASIS N/A 05/04/2018   Procedure: HOT HEMOSTASIS (ARGON PLASMA COAGULATION/BICAP);  Surgeon: [name], MD;  Location: [facility];  Service: Gastroenterology;  Laterality: N/A;   INTRAMEDULLARY (IM) NAIL INTERTROCHANTERIC Left 04/16/2019   Procedure: INTRAMEDULLARY (IM) NAIL INTERTROCHANTRIC;  Surgeon: Rod Can, MD;  Location: [facility];  Service: Orthopedics;  Laterality: Left;   INTRAMEDULLARY (IM) NAIL INTERTROCHANTERIC Right 06/17/2019   Procedure: RIGHT INTRAMEDULLARY (IM) NAIL INTERTROCHANTRIC;  Surgeon: Rod Can, MD;  Location: [facility];  Service: Orthopedics;  Laterality: Right;   IR NASO G TUBE PLC W/FL W/RAD  12/15/2020   ORIF PATELLA Right 06/09/2015   Procedure: OPEN REDUCTION INTERNAL (ORIF) FIXATION PATELLA;  Surgeon: Marybelle Killings, MD;  Location: [facility];  Service: Orthopedics;  Laterality: Right;   repair of bowel obstruction  1999   Dr. Dalbert Batman   repair of ventral hernia  04-20-1994   P. Cameron Sprang MD   resection femoral artery  03/19/2012   Dr. [name] Early   [place] INJECTION  05/04/2018   Procedure: SUBMUCOSAL INJECTION;  Surgeon: [name], MD;  Location: [facility];  Service: Gastroenterology;;   TRANSURETHRAL RESECTION OF BLADDER TUMOR N/A 08/18/2019   Procedure: cysto fulgeration clot evacuation;  Surgeon: Cleon Gustin, MD;  [facility];  Service: Urology;  Laterality: N/A;    Social History   Tobacco Use   Smoking status: Former    Years: 20.00    Types: Cigarettes    Quit date: 07/25/1968    Years since quitting: 52.5   Smokeless tobacco: Never  Vaping Use   Vaping Use: Never used  Substance Use Topics   Alcohol use: No   Drug use: No    Family History  Problem Relation Age of Onset   Cancer Mother    Cancer  Father    Varicose Veins Father    Cancer Sister    Diabetes Sister    Heart attack Sister     PE: Vitals:   02/15/21 1413 02/15/21 1540  BP: 118/83 110/71  Pulse: 74 73  Resp: 20 18  Temp: 98 F (36.7 C)   TempSrc: Oral   SpO2: 100% 96%   Patient appears to be in no acute distress  patient is alert and oriented x3 Atraumatic normocephalic head No cervical or supraclavicular lymphadenopathy appreciated No increased work of breathing Regular sinus rhythm/rate Abdomen is soft, nontender, nondistended Lower extremities are symmetric without appreciable edema Grossly neurologically intact No identifiable skin lesions  Recent Labs    02/15/21 1350  WBC 7.4  HGB 10.6*  HCT 32.8*   Recent Labs    02/15/21 1350  NA 139  K 3.8  CL 109  CO2 22  GLUCOSE 142*  BUN 26*  CREATININE 0.94  CALCIUM 9.0   No results for input(s): LABPT, INR in the last 72 hours. No results for input(s): LABURIN in the last 72 hours. Results for orders placed or performed during the hospital encounter of 01/22/21  Urine Culture     Status: None   Collection Time: 01/22/21  4:46 PM   Specimen: Urine, Clean Catch  Result Value Ref Range Status   Specimen Description   Final    URINE, CLEAN CATCH Performed at [facility], [place] [address] [place], [place] [address]    Special Requests   Final    NONE Performed at [facility], [place] [address], [place], [place] [address]    Culture   Final    NO GROWTH Performed at [facility], [place] [address] [place], [place] [address]    Report Status 01/24/2021 FINAL  Final  Resp Panel by RT-PCR (Flu A&B, Covid) Nasopharyngeal Swab     Status: None   Collection Time: 01/22/21  5:08 PM   Specimen: Nasopharyngeal Swab; Nasopharyngeal(NP) swabs in vial transport medium  Result Value Ref Range Status   SARS Coronavirus 2 by RT PCR NEGATIVE NEGATIVE Final    Comment: (NOTE) SARS-CoV-2 target nucleic acids  are NOT DETECTED.  The SARS-CoV-2 RNA is generally detectable in upper respiratory specimens  during the acute phase of infection. The lowest concentration of SARS-CoV-2 viral copies this assay can detect is 138 copies/mL. A negative result does not preclude SARS-Cov-2 infection and should not be used as the sole basis for treatment or other patient management decisions. A negative result may occur with  improper specimen collection/handling, submission of specimen other than nasopharyngeal swab, presence of viral mutation(s) within the areas targeted by this assay, and inadequate number of viral copies(<138 copies/mL). A negative result must be combined with clinical observations, patient history, and epidemiological information. The expected result is Negative.  Fact Sheet for Patients:  EntrepreneurPulse.com.au  Fact Sheet for Healthcare Providers:  IncredibleEmployment.be  This test is no t yet approved or cleared by the [place] FDA and  has been authorized for detection and/or diagnosis of SARS-CoV-2 by FDA under an Emergency Use Authorization (EUA). This EUA will remain  in effect (meaning this test can be used) for the duration of the COVID-19 declaration under Section 564(b)(1) of the Act, 21 U.S.C.section 360bbb-3(b)(1), unless the authorization is terminated  or revoked sooner.       Influenza A by PCR NEGATIVE NEGATIVE Final   Influenza B by PCR NEGATIVE NEGATIVE Final    Comment: (NOTE) The Xpert Xpress SARS-CoV-2/FLU/RSV plus assay is intended as an aid in the diagnosis of influenza from Nasopharyngeal swab specimens and should not be used as a sole basis for treatment. Nasal washings and aspirates are unacceptable for Xpert Xpress SARS-CoV-2/FLU/RSV testing.  Fact Sheet for Patients: EntrepreneurPulse.com.au  Fact Sheet for Healthcare Providers: IncredibleEmployment.be  This test is  not yet approved or cleared by the [place] FDA and has been authorized for detection and/or diagnosis of SARS-CoV-2 by FDA under an Emergency Use Authorization (EUA). This EUA will remain in effect (meaning this test can be used) for the duration of the COVID-19 declaration under Section 564(b)(1) of the Act, 21 U.S.C. section 360bbb-3(b)(1), unless the authorization is terminated or revoked.  Performed at [facility], [place] [address], [place], [place] [address]   Surgical pcr screen     Status: None   Collection Time: 01/23/21  9:47 AM   Specimen: Nasal Mucosa; Nasal Swab  Result Value Ref Range Status   MRSA, PCR NEGATIVE NEGATIVE Final   Staphylococcus aureus NEGATIVE NEGATIVE Final    Comment: (NOTE) The Xpert SA Assay (FDA approved for NASAL specimens in patients 85 years of age and older), is one component of a comprehensive surveillance program. It is not intended to diagnose infection nor to guide or monitor treatment. Performed at [facility], [place] [address] [place], [place] [address]     Imaging: CT Abd/Pelvis  IMPRESSION: Increased attenuating material in the bladder consistent with the known history of hematuria. A defect is noted in the posterior aspect of the blood/thrombus suspicious for a mass lesion although no thickening was seen on recent CT examination in this region.   Cholelithiasis without complicating factors.   Bibasilar scarring similar to that seen on prior CT.   Stable abdominal aortic aneurysm repair and bilateral iliac artery aneurysms similar to that seen on the recent exam.     Electronically Signed   By: Inez Catalina M.D.   On: 01/22/2021 14:12  Imp/Recommendations: 87 year old man with a history of radiation cystitis and friable prostatic bed in setting of prostate cancer status post EBRT.  Patient is having an exacerbation of radiation cystitis however appears more mild than earlier this  month.  A 24  [place] hematuria coud tip three-way Foley catheter was placed and inflated with 25 cc of sterile water in the balloon.  It was manually irrigated with 1 L sterile water and small clot burden removed.  It then drained light to moderate pink urine to gravity drainage.  -Continuous bladder irrigation is not needed at this point in time with urine pink-tinged but mostly clear and minimal clot burden -Recommend observation overnight -We will alert Dr. Milford Cage of patient's admission -Patient may benefit from outpatient hyperbaric oxygen to help with radiation cystitis -Please make n.p.o. at midnight    Thank you for involving me in this patient's care. Blakelyn Dinges D Kensie Susman

## 2021-02-15 NOTE — ED Notes (Signed)
Bladder scan shown 28m x2

## 2021-02-15 NOTE — ED Triage Notes (Signed)
BIBA Per EMS: Pt coming from [facility] with complaints of hematuria. Pt had prostate infection recently and was d/c with antibiotics. Pt began to notice more blood in urine. 18G began en routel; 500 bolus of NS given  96.3 temp  26 RR 126/80  90 HR  97% RA  150 CBG

## 2021-02-15 NOTE — H&P (Signed)
History and Physical    Jesse [name] [id number] DOB: 1928/07/12 DOA: 02/15/2021  PCP: System, Provider Not In  Patient coming from: [facility] assisted [facility] facility  UROLOGIST: Dr. Harold Barban [address] [place]. [address] [place] [address] [phone]  I have personally briefly reviewed patient's old medical records in [facility].  Chief Complaint: blood in urine  HPI: Jesse [name] is a 87 y.o. male with medical history significant for coronary artery disease (CABG), chronic systolic CHF (EF [id number] by January 2019 echocardiogram), prostate cancer 2010 with radiation treatments, hypertension, GERD, hyperlipidemia, anemia, who presents to the emergency department on 02/15/2021 with blood in urine. Of note, patient was just recently admitted 01/22/2021 through 01/26/2021 for abdominal pain with gross hematuria with clots and finding of renal stone; he was treated with Foley catheter (removed prior to discharge), bladder irrigation, urology consult, IV ceftriaxone for suspected urinary tract infection (culture eventually returned as negative), and cystoscopy with clot evacuation and fulguration on 01/23/2021; he was discharged on nitrofurantoin 100 mg nightly x5 days.  Regarding his current admission symptoms: Onset of current hematuria: it had cleared during his previous admission but several days after discharge, approximately 01/30/21, the patient noticed intermittent small amounts of blood in urine; the blood amount gradually increased and then 3 days prior to the current admission the blood in urine became severe with bright red blood and clots; duration is constantly present but variable in severity. He has suprapubic pain that began more than a month ago and is generally constant; it is up to 5/10 at times and characterized as a dull pain; it is very upsetting to him because he gets no relief unless a Foley is in place, then the pain decreases. Pain does not radiate. It is alleviated somewhat by  the Foley and exacerbated by nothing. Associated symptoms: Mild intermittent dysuria. No fever or chills. No vomiting or bloody stool. Has had loose stool/diarrhea for more than a month. Has chronic left leg swelling that is unchanged since a fracture long ago. Patient reports insomnia. Has intermittent bladder spasms.    ED Course: Urinalysis was positive for blood. Dr. Claudia Desanctis placed a 24 French coud tip three-way Foley catheter and irrigated with 1 L saline.    Review of Systems: As per HPI otherwise all other systems reviewed and are unremarable.  RESPIRATORY: No cough, wheezing, or shortness of breath.  CARDIOVASCULAR: No chest pain or palpitations.   Past Medical History:  Diagnosis Date   AAA (abdominal aortic aneurysm) ([place])    Arthritis    Cancer ([place]) 2010   prostate ( 40 Txs. of radiation treatments)   Coronary artery disease    Environmental allergies    GERD (gastroesophageal reflux disease)    History of prostate cancer 2010   HOH (hard of hearing)    wears bilateral hearing aids   Hyperlipidemia    Hypertension    Kidney stone 1968   Myocardial infarction ([place]) 1992   Pneumonia    hx of   RBBB     Past Surgical History:  Procedure Laterality Date   ABDOMINAL AORTIC ANEURYSM REPAIR  08-13-1993   Dr Cameron Sprang   CARDIAC CATHETERIZATION     COLONOSCOPY W/ POLYPECTOMY     CORONARY ARTERY BYPASS GRAFT  03-01-1991   Dr. Servando Snare   CYSTOSCOPY WITH FULGERATION N/A 01/23/2021   Procedure: Consuela Mimes WITH FULGERATION;  Surgeon: Lucas Mallow, MD;  Location: [facility];  Service: Urology;  Laterality: N/A;  ESOPHAGOGASTRODUODENOSCOPY (EGD) WITH PROPOFOL N/A 05/04/2018   Procedure: ESOPHAGOGASTRODUODENOSCOPY (EGD) WITH PROPOFOL;  Surgeon: [name], MD;  Location: [facility];  Service: Gastroenterology;  Laterality: N/A;   EYE SURGERY     Detached retina (Left eye); bilateral cataract removal   FALSE ANEURYSM REPAIR  05/07/2012   Procedure: REPAIR FALSE ANEURYSM;   Surgeon: Rosetta Posner, MD;  Location: [facility];  Service: Vascular;  Laterality: Left;  Repair of Left Femoral Artery Aneurysm   HERNIA REPAIR     HOT HEMOSTASIS N/A 05/04/2018   Procedure: HOT HEMOSTASIS (ARGON PLASMA COAGULATION/BICAP);  Surgeon: [name], MD;  Location: [facility];  Service: Gastroenterology;  Laterality: N/A;   INTRAMEDULLARY (IM) NAIL INTERTROCHANTERIC Left 04/16/2019   Procedure: INTRAMEDULLARY (IM) NAIL INTERTROCHANTRIC;  Surgeon: Rod Can, MD;  Location: [facility];  Service: Orthopedics;  Laterality: Left;   INTRAMEDULLARY (IM) NAIL INTERTROCHANTERIC Right 06/17/2019   Procedure: RIGHT INTRAMEDULLARY (IM) NAIL INTERTROCHANTRIC;  Surgeon: Rod Can, MD;  Location: [facility];  Service: Orthopedics;  Laterality: Right;   IR NASO G TUBE PLC W/FL W/RAD  12/15/2020   ORIF PATELLA Right 06/09/2015   Procedure: OPEN REDUCTION INTERNAL (ORIF) FIXATION PATELLA;  Surgeon: Marybelle Killings, MD;  Location: [facility];  Service: Orthopedics;  Laterality: Right;   repair of bowel obstruction  1999   Dr. Dalbert Batman   repair of ventral hernia  04-20-1994   P. Cameron Sprang MD   resection femoral artery  03/19/2012   Dr. [name] Early   [place] INJECTION  05/04/2018   Procedure: SUBMUCOSAL INJECTION;  Surgeon: [name], MD;  Location: [facility];  Service: Gastroenterology;;   TRANSURETHRAL RESECTION OF BLADDER TUMOR N/A 08/18/2019   Procedure: cysto fulgeration clot evacuation;  Surgeon: Cleon Gustin, MD;  Location: [facility];  Service: Urology;  Laterality: N/A;    Social History  reports that he quit smoking about 52 years ago. His smoking use included cigarettes. He has never used smokeless tobacco. He reports that he does not drink alcohol and does not use drugs.  Allergies  Allergen Reactions   Quinolones Other (See Comments)    unknown   Adhesive [Tape] Itching and Rash    Family History  Problem Relation Age of Onset   Cancer Mother    Cancer Father    Varicose  Veins Father    Cancer Sister    Diabetes Sister    Heart attack Sister      Home Medications  Prior to Admission medications   Medication Sig Start Date End Date Taking? Authorizing Provider  acetaminophen (TYLENOL) 500 MG tablet Take 500 mg by mouth every 6 (six) hours as needed for mild pain or headache.    [provider]  alendronate (FOSAMAX) 70 MG tablet Take 70 mg by mouth every Saturday. Saturday 08/14/19   [provider]  aspirin 81 MG tablet Take 1 tablet (81 mg total) by mouth daily. 08/26/19   Terrilee Croak, MD  bisacodyl (DULCOLAX) 5 MG EC tablet Take 1 tablet (5 mg total) by mouth daily as needed for moderate constipation. 12/25/20 12/25/21  Lavina Hamman, MD  carboxymethylcellul-glycerin (OPTIVE) 0.5-0.9 % ophthalmic solution Place 2 drops into both eyes at bedtime.    [provider]  carboxymethylcellulose (REFRESH PLUS) 0.5 % SOLN Apply 2 drops to eye at bedtime.    [provider]  carvedilol (COREG) 3.125 MG tablet TAKE 1 TABLET BY MOUTH 2 TIMES DAILY WITH A MEAL 12/25/20   Lavina Hamman, MD  docusate sodium (COLACE) 100 MG capsule Take 1 capsule (100 mg total) by mouth 2 (two) times daily. 12/25/20 12/25/21  Lavina Hamman, MD  fenofibrate 160 MG tablet Take 1 tablet (160 mg total) by mouth daily. 05/29/19   [name], MD  finasteride (PROSCAR) 5 MG tablet Take 5 mg by mouth daily. 02/10/20   [provider]  melatonin 3 MG TABS tablet Take 3 mg by mouth at bedtime as needed (sleep).    [provider]  pantoprazole (PROTONIX) 40 MG tablet Take 40 mg by mouth daily.    [provider]  polyethylene glycol (MIRALAX / GLYCOLAX) 17 g packet Take 17 g by mouth daily. 12/26/20   Lavina Hamman, MD  sertraline (ZOLOFT) 50 MG tablet Take 50 mg by mouth daily. 12/04/20   [provider]  simvastatin (ZOCOR) 40 MG tablet Take 1 tablet (40 mg total) by mouth daily at 6 PM. 05/29/19   [name], MD  sodium  chloride (OCEAN) 0.65 % SOLN nasal spray Place 2 sprays into both nostrils 2 (two) times daily.    [provider]  spironolactone (ALDACTONE) 25 MG tablet Take 12.5 mg by mouth daily. 12/04/20   [provider]  tamsulosin (FLOMAX) 0.4 MG CAPS capsule Take 0.4 mg by mouth daily. 02/10/20   [provider]    Physical Exam: Vitals:   02/15/21 1600 02/15/21 1700 02/15/21 1703 02/15/21 1800  BP: (!) 131/94 134/85 134/85 (!) 149/89  Pulse: 79 75 79 68  Resp: '17  18 17  '$ Temp:      TempSrc:      SpO2: 99% 98% 100% 99%    Constitutional: NAD, calm, comfortable, ill-appearing. Vitals:   02/15/21 1600 02/15/21 1700 02/15/21 1703 02/15/21 1800  BP: (!) 131/94 134/85 134/85 (!) 149/89  Pulse: 79 75 79 68  Resp: '17  18 17  '$ Temp:      TempSrc:      SpO2: 99% 98% 100% 99%   Eyes: Pupils equal and round, lids and conjunctivae without icterus or erythema. ENMT: Mucous membranes are dry. Posterior pharynx clear of any exudate or lesions. Nares patent without discharge or bleeding.  Normocephalic, atraumatic.  Normal dentition.  Neck: normal, supple, no masses, trachea midline.  Thyroid nontender, no masses appreciated, no thyromegaly. Respiratory: clear to auscultation bilaterally. Chest wall movements are symmetric. No wheezing, no crackles.  No rhonchi.  Normal respiratory effort. No accessory muscle use.  Cardiovascular: Regular rate and rhythm, no murmurs / rubs / gallops. Pulses: DP pulses 2+ bilaterally. No carotid bruits.  Capillary refill less than 3 seconds. Edema: Trace bilaterally. GI: soft, non-distended, normal active bowel sounds. No hepatosplenomegaly. No rigidity, rebound, or guarding. Non-tender. No masses palpated. No CVA tenderness bilaterally. Musculoskeletal: no clubbing / cyanosis. No joint deformity upper and lower extremities. Good ROM, no contractures. Normal muscle tone.  No tenderness or deformity in the back bilaterally. Integument: no rashes,  lesions, ulcers. No induration. Clean, dry, intact. Pale. Neurologic: CN 2-12 grossly intact. Sensation grossly intact to light touch. DTR 2+ bilaterally.  Babinski: Toes downgoing bilaterally.  Strength 5/5 in all 4.  Intact rapid alternating movements bilaterally.  No pronator drift. Psychiatric: Normal judgment and insight. Alert and oriented x 3. Normal mood.  Normal and appropriate affect. Lymphatic: No cervical lymphadenopathy. No supraclavicular lymphadenopathy.   Labs on Admission: I have personally reviewed the following labs and imaging studies.  CBC: Recent Labs  Lab 02/15/21 1350  WBC 7.4  NEUTROABS  5.7  HGB 10.6*  HCT 32.8*  MCV 91.9  PLT [id number]    Basic Metabolic Panel: Recent Labs  Lab 02/15/21 1350  NA 139  K 3.8  CL 109  CO2 22  GLUCOSE 142*  BUN 26*  CREATININE 0.94  CALCIUM 9.0    GFR: CrCl cannot be calculated (Unknown ideal weight.).  Liver Function Tests: No results for input(s): AST, ALT, ALKPHOS, BILITOT, PROT, ALBUMIN in the last 168 hours.  Urine analysis:    Component Value Date/Time   COLORURINE RED (A) 02/15/2021 1341   APPEARANCEUR TURBID (A) 02/15/2021 1341   LABSPEC  02/15/2021 1341    TEST NOT REPORTED DUE TO COLOR INTERFERENCE OF URINE PIGMENT   PHURINE  02/15/2021 1341    TEST NOT REPORTED DUE TO COLOR INTERFERENCE OF URINE PIGMENT   GLUCOSEU (A) 02/15/2021 1341    TEST NOT REPORTED DUE TO COLOR INTERFERENCE OF URINE PIGMENT   HGBUR (A) 02/15/2021 1341    TEST NOT REPORTED DUE TO COLOR INTERFERENCE OF URINE PIGMENT   BILIRUBINUR (A) 02/15/2021 1341    TEST NOT REPORTED DUE TO COLOR INTERFERENCE OF URINE PIGMENT   KETONESUR (A) 02/15/2021 1341    TEST NOT REPORTED DUE TO COLOR INTERFERENCE OF URINE PIGMENT   PROTEINUR (A) 02/15/2021 1341    TEST NOT REPORTED DUE TO COLOR INTERFERENCE OF URINE PIGMENT   UROBILINOGEN 2.0 (H) 05/28/2015 0029   NITRITE (A) 02/15/2021 1341    TEST NOT REPORTED DUE TO COLOR INTERFERENCE OF URINE  PIGMENT   LEUKOCYTESUR (A) 02/15/2021 1341    TEST NOT REPORTED DUE TO COLOR INTERFERENCE OF URINE PIGMENT    Radiological Exams on Admission:  Reviewed personally CT renal stone study from 01/22/2021. EXAM: CT ABDOMEN AND PELVIS WITHOUT CONTRAST   TECHNIQUE: Multidetector CT imaging of the abdomen and pelvis was performed following the standard protocol without IV contrast.   COMPARISON:  12/21/2020   FINDINGS: Lower chest: Mild reticulonodular scarring is noted in the bases bilaterally.   Hepatobiliary: Multiple gallstones are noted. No gallbladder wall thickening or pericholecystic fluid is noted. The liver is within normal limits.   Pancreas: Unremarkable. No pancreatic ductal dilatation or surrounding inflammatory changes.   Spleen: Normal in size without focal abnormality.   Adrenals/Urinary Tract: Adrenal glands are within normal limits bilaterally. Cysts are seen in the kidneys bilaterally. Increased fullness of the collecting systems is noted bilaterally likely related to compression on the ureter and bladder by internal iliac artery aneurysms. Bladder demonstrates diffuse increased attenuation within consistent with the known history of hematuria. Posterior defect in the hematocrit level is noted along the right posterior bladder wall best seen on image number 72 of series 5. This is of uncertain significance as no definitive mass was noted in this region on prior CT.   Stomach/Bowel: The appendix is not well visualized. No inflammatory changes are seen. The colon and small bowel show no obstructive or inflammatory changes. Stomach is distended with fluid without obstructing lesion.   Vascular/Lymphatic: Changes are noted consistent with prior abdominal aortic repair. The overall appearance is stable from the prior exam. Delineation of the lumen is difficult given the lack of IV contrast. Internal iliac artery aneurysms are noted bilaterally as well as common  femoral artery aneurysms stable in appearance from the prior exam.   Reproductive: Fiducial markers are noted in the prostate bed.   Other: No abdominal wall hernia or abnormality. No abdominopelvic ascites.   Musculoskeletal: Bilateral femur surgery is seen. Degenerative  changes of lumbar spine are noted.   IMPRESSION: Increased attenuating material in the bladder consistent with the known history of hematuria. A defect is noted in the posterior aspect of the blood/thrombus suspicious for a mass lesion although no thickening was seen on recent CT examination in this region.   Cholelithiasis without complicating factors.   Bibasilar scarring similar to that seen on prior CT.   Stable abdominal aortic aneurysm repair and bilateral iliac artery aneurysms similar to that seen on the recent exam.     EKG: Independently reviewed from 12/12/2020.  54 bpm.  Sinus rhythm.  Echocardiogram, 08/04/2017: Study Conclusions  EF 40-45% - Left ventricle: The cavity size was normal. Wall thickness was    normal. Systolic function was mildly to moderately reduced. The    estimated ejection fraction was in the range of 40% to 45%. Wall    motion was normal; there were no regional wall motion    abnormalities. There was an increased relative contribution of    atrial contraction to ventricular filling. Doppler parameters are    consistent with abnormal left ventricular relaxation (grade 1    diastolic dysfunction). Doppler parameters are consistent with    elevated ventricular end-diastolic filling pressure.  - Ventricular septum: Septal motion showed paradox.  - Mitral valve: Calcified annulus.  - Left atrium: The atrium was moderately dilated.    Assessment/Plan Principal Problem:   Gross hematuria Active Problems:   Anemia associated with acute blood loss   Radiation cystitis   Coronary artery disease involving native coronary artery of native heart without angina pectoris   Chronic  systolic heart failure (HCC)   History of prostate cancer   Mixed hyperlipidemia   Essential hypertension   GERD without esophagitis    Insomnia    Diarrhea   Principal Problem:    Gross hematuria Recurrence of gross hematuria; was hospitalized earlier in July for same. Suspected to be due to friable tissue with history of radiation cystitis and prostate cancer. Plan: Appreciate urology consult by Dr. Claudia Desanctis in the emergency department.  Dr. Claudia Desanctis placed a 24 French coud tip three-way Foley catheter and irrigated it with 1 L saline.  Per Dr. Claudia Desanctis; no need for continuous bladder irrigation at this time.  Continue Foley catheter.  Monitor urine output.  N.p.o. after midnight in case procedure is planned.   Dr. Claudia Desanctis also noted that patient may benefit from outpatient hyperbaric oxygen treatment for his radiation cystitis; will defer to daytime team to consider this referral. Patient's usual urologist, Dr. Milford Cage, will consult in the a.m. on 02/16/2021.   Active Problems:    Anemia associated with acute blood loss Plan:   Monitor H/H.  If Hemoglobin is less than 7, then: Transfuse PRBCs.     Radiation cystitis Recurrent issue for patient noted by urologist in the emergency department. Likely causing the recurrent hematuria. Plan: Management per urologist.    Coronary artery disease (CAD), involving native coronary artery of native heart without angina pectoris Plan: Continue home medications including carvedilol and simvastatin except hold aspirin due to acute bleeding.    Chronic systolic congestive heart failure (CHF) No evidence of acute CHF.  Patient has apparently had episodes of hypotension in the past, which limits the medications he can take for CHF.  Echocardiogram 2019 showed EF 40-45%. Plan: Continue home beta-blocker, carvedilol.  No ACE/ARB due to history of hypotension.  Monitor I/O.  Avoid excessive IV fluids.    Mixed hyperlipidemia Plan: Continue simvastatin and  fenofibrate.    Essential hypertension Plan: Continue carvedilol with hold parameters if blood pressure is too low.    History of prostate cancer With history of radiation treatment.  Has known radiation cystitis. Plan: Urology consult.    GERD without esophagitis Plan: Continue pantoprazole.    Insomnia Plan: Scheduled melatonin. PRN Ambien.    Diarrhea Has been present for more than a month. Will give FloraStor or other probiotic.    DVT prophylaxis: SCDs.  Code Status:   DNR. Discussed code status options at length with the patient who elects to be DNR. Fully explained code status. Answered all questions.    Disposition Plan:   Patient is from:  [facility] assisted living facility  Anticipated DC to:  [facility] assisted [facility] facility  Anticipated DC date:  02/16/2021  Anticipated DC barriers: 02/16/2021  Consults called:  ED physician called Urologist, Dr. Claudia Desanctis.  Will need to call Urologist Dr. Milford Cage in the AM 02/16/21.  Admission status:  Observation   Severity of Illness: The appropriate patient status for this patient is OBSERVATION. Observation status is judged to be reasonable and necessary in order to provide the required intensity of service to ensure the patient's safety. The patient's presenting symptoms, physical exam findings, and initial radiographic and laboratory data in the context of their medical condition is felt to place them at decreased risk for further clinical deterioration. Furthermore, it is anticipated that the patient will be medically stable for discharge from the hospital within 2 midnights of admission. The following factors support the patient status of observation.   " The patient's presenting symptoms include gross hematuria. " The physical exam findings include coud Foley placed now draining pink-tinged urine after irrigation. " The initial radiographic and laboratory data are hemoglobin 10.6.    Tacey Ruiz MD [facility]    How to contact the [facility] Attending or Consulting provider [place] or covering provider during after hours [place], for this patient?   Check the care team in [facility] and look for a) attending/consulting [facility] provider listed and b) the [facility] team listed Log into [email or web address] and use [facility] universal password to access. If you do not have the password, please contact the hospital operator. Locate the [facility] provider you are looking for under [facility] and page to a number that you can be directly reached. If you still have difficulty reaching the provider, please page the [facility] (Director on Call) for the Hospitalists listed on amion for assistance.  02/15/2021, 6:18 PM

## 2021-02-15 NOTE — ED Provider Notes (Signed)
Patient care assumed at 1700.  Pt with hx/o prostate cancer s/p radiation here with hematuria.  Urology to evaluate in ED and irrigate bladder.    Foley catheter placed by urology. Plan to admit to medicine service for observation of hematuria.   Quintella Reichert, MD 02/15/21 2225

## 2021-02-16 LAB — BASIC METABOLIC PANEL
Anion gap: 8 (ref 5–15)
BUN: 21 mg/dL (ref 8–23)
CO2: 23 mmol/L (ref 22–32)
Calcium: 9.3 mg/dL (ref 8.9–10.3)
Chloride: 104 mmol/L (ref 98–111)
Creatinine, Ser: 0.73 mg/dL (ref 0.61–1.24)
GFR, Estimated: >60 mL/min (ref >60)
Glucose, Bld: 107 mg/dL — ABNORMAL HIGH (ref 70–99)
Potassium: 3.8 mmol/L (ref 3.5–5.1)
Sodium: 135 mmol/L (ref 135–145)

## 2021-02-16 LAB — CBC
HCT: 33.1 % — ABNORMAL LOW (ref 39.0–52.0)
Hemoglobin: 10.2 g/dL — ABNORMAL LOW (ref 13.0–17.0)
MCH: 29.2 pg (ref 26.0–34.0)
MCHC: 30.8 g/dL (ref 30.0–36.0)
MCV: 94.8 fL (ref 80.0–100.0)
Platelets: 249 10*3/uL (ref 150–400)
RBC: 3.49 MIL/uL — ABNORMAL LOW (ref 4.22–5.81)
RDW: 14.4 % (ref 11.5–15.5)
WBC: 10.8 10*3/uL — ABNORMAL HIGH (ref 4.0–10.5)
nRBC: 0 % (ref 0.0–0.2)

## 2021-02-16 LAB — APTT: aPTT: 30 seconds (ref 24–36)

## 2021-02-16 LAB — PROTIME-INR
INR: 1.2 (ref 0.8–1.2)
Prothrombin Time: 14.9 seconds (ref 11.4–15.2)

## 2021-02-16 LAB — SARS CORONAVIRUS 2 (TAT 6-24 HRS): SARS Coronavirus 2: NEGATIVE

## 2021-02-16 MED ORDER — FENOFIBRATE 160 MG PO TABS
160.0000 mg | ORAL_TABLET | Freq: Every day | ORAL | Status: DC
  Administered 2021-02-16 – 2021-02-22 (×7): 160 mg via ORAL
  Filled 2021-02-16 (×8): qty 1

## 2021-02-16 MED ORDER — DOCUSATE SODIUM 100 MG PO CAPS
100.0000 mg | ORAL_CAPSULE | Freq: Two times a day (BID) | ORAL | Status: DC
  Administered 2021-02-16 – 2021-02-22 (×6): 100 mg via ORAL
  Filled 2021-02-16 (×14): qty 1

## 2021-02-16 MED ORDER — HYDROCODONE-ACETAMINOPHEN 5-325 MG PO TABS
1.0000 | ORAL_TABLET | ORAL | Status: DC | PRN
  Administered 2021-02-16 – 2021-02-17 (×3): 1 via ORAL
  Administered 2021-02-18: 2 via ORAL
  Administered 2021-02-18: 1 via ORAL
  Administered 2021-02-19 – 2021-02-20 (×5): 2 via ORAL
  Filled 2021-02-16: qty 1
  Filled 2021-02-16: qty 2
  Filled 2021-02-16: qty 1
  Filled 2021-02-16: qty 2
  Filled 2021-02-16: qty 1
  Filled 2021-02-16: qty 2
  Filled 2021-02-16: qty 1
  Filled 2021-02-16 (×3): qty 2

## 2021-02-16 MED ORDER — SPIRONOLACTONE 12.5 MG HALF TABLET
12.5000 mg | ORAL_TABLET | Freq: Every day | ORAL | Status: DC
  Administered 2021-02-16 – 2021-02-22 (×7): 12.5 mg via ORAL
  Filled 2021-02-16 (×7): qty 1

## 2021-02-16 MED ORDER — ACETAMINOPHEN 650 MG RE SUPP: 650.0000 mg | Freq: Four times a day (QID) | RECTAL | Status: DC | PRN

## 2021-02-16 MED ORDER — SIMVASTATIN 40 MG PO TABS
40.0000 mg | ORAL_TABLET | Freq: Every day | ORAL | Status: DC
  Administered 2021-02-16 – 2021-02-22 (×7): 40 mg via ORAL
  Filled 2021-02-16 (×7): qty 1

## 2021-02-16 MED ORDER — ACETAMINOPHEN 325 MG PO TABS
650.0000 mg | ORAL_TABLET | Freq: Four times a day (QID) | ORAL | Status: DC | PRN
  Administered 2021-02-16 – 2021-02-20 (×2): 650 mg via ORAL
  Filled 2021-02-16 (×2): qty 2

## 2021-02-16 MED ORDER — SENNOSIDES-DOCUSATE SODIUM 8.6-50 MG PO TABS: 1.0000 | ORAL_TABLET | Freq: Every evening | ORAL | Status: DC | PRN

## 2021-02-16 MED ORDER — FLORANEX PO PACK
PACK | Freq: Two times a day (BID) | ORAL | Status: DC
  Administered 2021-02-16 – 2021-02-22 (×10): 1 g via ORAL
  Filled 2021-02-16 (×16): qty 1

## 2021-02-16 MED ORDER — BISACODYL 10 MG RE SUPP: 10.0000 mg | Freq: Every day | RECTAL | Status: DC | PRN

## 2021-02-16 MED ORDER — ONDANSETRON HCL 4 MG PO TABS: 4.0000 mg | ORAL_TABLET | Freq: Four times a day (QID) | ORAL | Status: DC | PRN

## 2021-02-16 MED ORDER — CEPHALEXIN 500 MG PO CAPS
500.0000 mg | ORAL_CAPSULE | Freq: Three times a day (TID) | ORAL | Status: DC
  Administered 2021-02-16 (×3): 500 mg via ORAL
  Filled 2021-02-16 (×3): qty 1

## 2021-02-16 MED ORDER — CARVEDILOL 3.125 MG PO TABS
3.1250 mg | ORAL_TABLET | Freq: Two times a day (BID) | ORAL | Status: DC
  Administered 2021-02-16 – 2021-02-22 (×14): 3.125 mg via ORAL
  Filled 2021-02-16 (×14): qty 1

## 2021-02-16 MED ORDER — ONDANSETRON HCL 4 MG/2ML IJ SOLN: 4.0000 mg | Freq: Four times a day (QID) | INTRAMUSCULAR | Status: DC | PRN

## 2021-02-16 MED ORDER — POLYETHYLENE GLYCOL 3350 17 G PO PACK
17.0000 g | PACK | Freq: Every day | ORAL | Status: DC
  Administered 2021-02-16 – 2021-02-19 (×3): 17 g via ORAL
  Filled 2021-02-16 (×6): qty 1

## 2021-02-16 MED ORDER — CHLORHEXIDINE GLUCONATE CLOTH 2 % EX PADS
6.0000 | MEDICATED_PAD | Freq: Every day | CUTANEOUS | Status: DC
  Administered 2021-02-16 – 2021-02-21 (×6): 6 via TOPICAL

## 2021-02-16 MED ORDER — TAMSULOSIN HCL 0.4 MG PO CAPS
0.4000 mg | ORAL_CAPSULE | Freq: Every day | ORAL | Status: DC
  Administered 2021-02-16 – 2021-02-22 (×8): 0.4 mg via ORAL
  Filled 2021-02-16 (×8): qty 1

## 2021-02-16 MED ORDER — SERTRALINE HCL 50 MG PO TABS
50.0000 mg | ORAL_TABLET | Freq: Every day | ORAL | Status: DC
  Administered 2021-02-16 – 2021-02-22 (×7): 50 mg via ORAL
  Filled 2021-02-16 (×7): qty 1

## 2021-02-16 MED ORDER — PANTOPRAZOLE SODIUM 40 MG PO TBEC
40.0000 mg | DELAYED_RELEASE_TABLET | Freq: Every day | ORAL | Status: DC
  Administered 2021-02-16 – 2021-02-22 (×7): 40 mg via ORAL
  Filled 2021-02-16 (×7): qty 1

## 2021-02-16 MED ORDER — SALINE SPRAY 0.65 % NA SOLN
2.0000 | Freq: Two times a day (BID) | NASAL | Status: DC
  Administered 2021-02-16 – 2021-02-22 (×13): 2 via NASAL
  Filled 2021-02-16: qty 44

## 2021-02-16 MED ORDER — DEXTROSE-NACL 5-0.45 % IV SOLN: INTRAVENOUS | Status: AC

## 2021-02-16 MED ORDER — SODIUM CHLORIDE 0.9 % IV SOLN
1.0000 g | INTRAVENOUS | Status: DC
  Administered 2021-02-16 – 2021-02-18 (×3): 1 g via INTRAVENOUS
  Filled 2021-02-16: qty 10
  Filled 2021-02-16: qty 1
  Filled 2021-02-16: qty 10
  Filled 2021-02-16: qty 1

## 2021-02-16 MED ORDER — MORPHINE SULFATE (PF) 2 MG/ML IV SOLN
2.0000 mg | INTRAVENOUS | Status: DC | PRN
Start: 2021-02-16 — End: 2021-02-23

## 2021-02-16 MED ORDER — POLYVINYL ALCOHOL 1.4 % OP SOLN
2.0000 [drp] | Freq: Every day | OPHTHALMIC | Status: DC
  Administered 2021-02-16 – 2021-02-21 (×6): 2 [drp] via OPHTHALMIC
  Filled 2021-02-16: qty 15

## 2021-02-16 MED ORDER — FINASTERIDE 5 MG PO TABS
5.0000 mg | ORAL_TABLET | Freq: Every day | ORAL | Status: DC
  Administered 2021-02-16 – 2021-02-22 (×7): 5 mg via ORAL
  Filled 2021-02-16 (×8): qty 1

## 2021-02-16 NOTE — Progress Notes (Signed)
  Subjective: Patient feels better today Foley catheter remains in place is draining basically amber-colored urine with some sediment no evidence of active bleeding or clots.  Patient is not on CBI.  Objective: Vital signs in last 24 hours: Temp:  [98 F (36.7 C)-98.5 F (36.9 C)] 98.5 F (36.9 C) (07/26 0659) Pulse Rate:  [68-93] 70 (07/26 0659) Resp:  [16-20] 18 (07/26 0659) BP: (98-149)/(67-94) 121/71 (07/26 0659) SpO2:  [94 %-100 %] 98 % (07/26 0659) Weight:  [77.1 kg] 77.1 kg (07/26 0659)  Intake/Output from previous day: 07/25 0701 - 07/26 0700 In: -  Out: 1100 [Urine:1100] Intake/Output this shift: No intake/output data recorded.  Physical Exam:  General: Alert and oriented  Abdomen: Soft, ND  Lab Results: Recent Labs    02/15/21 1350 02/16/21 0358  HGB 10.6* 10.2*  HCT 32.8* 33.1*   BMET Recent Labs    02/15/21 1350 02/16/21 0358  NA 139 135  K 3.8 3.8  CL 109 104  CO2 22 23  GLUCOSE 142* 107*  BUN 26* 21  CREATININE 0.94 0.73  CALCIUM 9.0 9.3     Studies/Results: No results found.  Assessment/Plan: Gross hematuria, likely secondary to recurrent radiation cystitis.  Improved with Foley catheter placement, stable blood count and not requiring CBI Plan/recommendation: Patient likely would benefit from hyperbaric oxygen therapy, will try and set up consult with hyperbaric oxygen.  We will let patient have regular diet and monitor urine today    LOS: 0 days   Jesse [name] 02/16/2021, 10:41 AM

## 2021-02-16 NOTE — ED Notes (Signed)
ED TO INPATIENT HANDOFF REPORT  Name/Age/Gender Jesse [name] 87 y.o. male  Code Status    Code Status Orders  (From admission, onward)         Start     Ordered   02/16/21 0311  Do not attempt resuscitation (DNR)  Continuous       Question Answer Comment  In the event of cardiac or respiratory ARREST Do not call a "code blue"   In the event of cardiac or respiratory ARREST Do not perform Intubation, CPR, defibrillation or ACLS   In the event of cardiac or respiratory ARREST Use medication by any route, position, wound care, and other measures to relive pain and suffering. May use oxygen, suction and manual treatment of airway obstruction as needed for comfort.   Comments Wants everything done including ICU care, pressors, other interventions except does not want chest compressions, CPR, intubation, or defibrillation.      02/16/21 0310        Code Status History    Date Active Date Inactive Code Status Order ID Comments User Context   01/22/2021 2332 01/26/2021 2058 DNR [id number]  Dessa Phi, DO ED   12/13/2020 1307 12/25/2020 2128 DNR [id number]  Pershing Proud, NP Inpatient   12/12/2020 0804 12/13/2020 1307 Full Code [id number]  [name], MD ED   08/15/2019 1553 08/21/2019 1908 DNR [id number]  Karmen Bongo, MD ED   06/15/2019 2308 06/24/2019 1607 DNR [id number]  Shela Leff, MD ED   06/15/2019 2308 06/15/2019 2308 Full Code [id number]  Shela Leff, MD ED   04/15/2019 1809 04/20/2019 1700 DNR [id number]  Georgette Shell, MD ED   04/15/2019 1741 04/15/2019 1809 Full Code [id number]  Georgette Shell, MD ED   05/04/2018 1443 05/08/2018 1621 DNR [id number]  Elodia Florence., MD Inpatient   06/09/2015 1037 06/11/2015 1711 Full Code [id number]  Lanae Crumbly, PA-C Inpatient   05/07/2012 1400 05/09/2012 1556 Full Code [id number]  Jolyne Loa, RN Inpatient   03/19/2012 1157 03/21/2012 1901 Full Code [id number]  Johnsie Cancel, RN Inpatient    Advance  Directive Documentation   Flowsheet Row Most Recent Value  Type of Advance Directive Healthcare Power of Attorney  Pre-existing out of facility DNR order (yellow form or pink MOST form) Pink Most/Yellow Form available - Physician notified to receive inpatient order  "MOST" Form in Place? --      Home/SNF/Other Skilled nursing facility  Chief Complaint Gross hematuria [R31.0]  Level of Care/Admitting Diagnosis ED Disposition    ED Disposition  Admit   Condition  --   [place]: [facility] [[id number]] Level of Care: Med-Surg [16] May place patient in observation at [facility] or [place] if equivalent level of care is available:: Yes Covid Evaluation: covid negative Diagnosis: Gross hem aturia [599.71.ICD-9-CM] Admitting Physician: Tacey Ruiz [id number] Attending Physician: Tacey Ruiz [id number]         Medical History Past Medical History:  Diagnosis Date  . AAA (abdominal aortic aneurysm) ([place])   . Arthritis   . Cancer ([place]) 2010   prostate ( 40 Txs. of radiation treatments)  . Coronary artery disease   . Environmental allergies   . GERD (gastroesophageal reflux disease)   . History of prostate cancer 2010  . HOH (hard of hearing)    wears bilateral hearing aids  . Hyperlipidemia   . Hypertension   . Kidney stone 1968  . Myocardial infarction ([place]) 1992  .  Pneumonia    hx of  . RBBB     Allergies Allergies  Allergen Reactions  . Quinolones Other (See Comments)    Unknown reaction  . Adhesive [Tape] Itching and Rash    IV Location/Drains/Wounds Patient Lines/Drains/Airways Status    Active Line/Drains/Airways    Name Placement date Placement time Site Days   Peripheral IV 02/15/21 18 G Anterior;Left;Proximal Forearm 02/15/21  1408  Forearm  1   Incision (Closed) 01/23/21 Penis Other (Comment) 01/23/21  1140  -- 24          Labs/Imaging Results for orders placed or performed during the hospital encounter  of 02/15/21 (from the past 48 hour(s))  Urinalysis, Routine w reflex microscopic     Status: Abnormal   Collection Time: 02/15/21  1:41 PM  Result Value Ref Range   Color, Urine RED (A) YELLOW   APPearance TURBID (A) CLEAR   Specific Gravity, Urine  1.005 - 1.030    TEST NOT REPORTED DUE TO COLOR INTERFERENCE OF URINE PIGMENT   pH  5.0 - 8.0    TEST NOT REPORTED DUE TO COLOR INTERFERENCE OF URINE PIGMENT   Glucose, UA (A) NEGATIVE mg/dL    TEST NOT REPORTED DUE TO COLOR INTERFERENCE OF URINE PIGMENT   Hgb urine dipstick (A) NEGATIVE    TEST NOT REPORTED DUE TO COLOR INTERFERENCE OF URINE PIGMENT   Bilirubin Urine (A) NEGATIVE    TEST NOT REPORTED DUE TO COLOR INTERFERENCE OF URINE PIGMENT   Ketones, ur (A) NEGATIVE mg/dL    TEST NOT REPORTED DUE TO COLOR INTERFERENCE OF URINE PIGMENT   Protein, ur (A) NEGATIVE mg/dL    TEST NOT REPORTED DUE TO COLOR INTERFERENCE OF URINE PIGMENT   Nitrite (A) NEGATIVE    TEST NOT REPORTED DUE TO COLOR INTERFERENCE OF URINE PIGMENT   Leukocytes,Ua (A) NEGATIVE    TEST NOT REPORTED DUE TO COLOR INTERFERENCE OF URINE PIGMENT    Comment: Performed at [facility], [place] [address] [place], [place] [address]  Urinalysis, Microscopic (reflex)     Status: Abnormal   Collection Time: 02/15/21  1:41 PM  Result Value Ref Range   RBC / HPF >50 0 - 5 RBC/hpf   WBC, UA 6-10 0 - 5 WBC/hpf   Bacteria, UA RARE (A) NONE SEEN   Squamous Epithelial / LPF NONE SEEN 0 - 5   Urine-Other MICROSCOPIC EXAM PERFORMED ON UNCONCENTRATED URINE     Comment: Performed at [facility], [place] [address], [place], [place] [address]  CBC with Differential     Status: Abnormal   Collection Time: 02/15/21  1:50 PM  Result Value Ref Range   WBC 7.4 4.0 - 10.5 K/uL   RBC 3.57 (L) 4.22 - 5.81 MIL/uL   Hemoglobin 10.6 (L) 13.0 - 17.0 g/dL   HCT 32.8 (L) 39.0 - 52.0 %   MCV 91.9 80.0 - 100.0 fL   MCH 29.7 26.0 - 34.0 pg   MCHC 32.3 30.0 - 36.0  g/dL   RDW 14.3 11.5 - 15.5 %   Platelets 284 150 - 400 K/uL   nRBC 0.0 0.0 - 0.2 %   Neutrophils Relative % 78 %   Neutro Abs 5.7 1.7 - 7.7 K/uL   Lymphocytes Relative 10 %   Lymphs Abs 0.7 0.7 - 4.0 K/uL   Monocytes Relative 7 %   Monocytes Absolute 0.5 0.1 - 1.0 K/uL   Eosinophils Relative 4 %   Eosinophils Absolute  0.3 0.0 - 0.5 K/uL   Basophils Relative 1 %   Basophils Absolute 0.1 0.0 - 0.1 K/uL   Immature Granulocytes 0 %   Abs Immature Granulocytes 0.03 0.00 - 0.07 K/uL    Comment: Performed at [facility], [place] [address], [place], [place] [id number]  Basic metabolic panel     Status: Abnormal   Collection Time: 02/15/21  1:50 PM  Result Value Ref Range   Sodium 139 135 - 145 mmol/L   Potassium 3.8 3.5 - 5.1 mmol/L   Chloride 109 98 - 111 mmol/L   CO2 22 22 - 32 mmol/L   Glucose, Bld 142 (H) 70 - 99 mg/dL    Comment: Glucose reference range applies only to samples taken after fasting for at least 8 hours.   BUN 26 (H) 8 - 23 mg/dL   Creatinine, Ser 0.94 0.61 - 1.24 mg/dL   Calcium 9.0 8.9 - 10.3 mg/dL   GFR, Estimated >60 >60 mL/min    Comment: (NOTE) Calculated using the CKD-EPI Creatinine Equation (2021)    Anion gap 8 5 - 15    Comment: Performed at [facility], [place] [address] [place], [place] [id number]  Basic metabolic panel     Status: Abnormal   Collection Time: 02/16/21  3:58 AM  Result Value Ref Range   Sodium 135 135 - 145 mmol/L   Potassium 3.8 3.5 - 5.1 mmol/L   Chloride 104 98 - 111 mmol/L   CO2 23 22 - 32 mmol/L   Glucose, Bld 107 (H) 70 - 99 mg/dL    Comment: Glucose reference range applies only to samples taken after fasting for at least 8 hours.   BUN 21 8 - 23 mg/dL   Creatinine, Ser 0.73 0.61 - 1.24 mg/dL   Calcium 9.3 8.9 - 10.3 mg/dL   GFR, Estimated >60 >60 mL/min    Comment: (NOTE) Calculated using the CKD-EPI Creatinine Equation (2021)    Anion gap 8 5 - 15    Comment: Performed at [facility], [place] [address] [place], [place] [address]  CBC     Status: Abnormal   Collection Time: 02/16/21  3:58 AM  Result Value Ref Range   WBC 10.8 (H) 4.0 - 10.5 K/uL   RBC 3.49 (L) 4.22 - 5.81 MIL/uL   Hemoglobin 10.2 (L) 13.0 - 17.0 g/dL   HCT 33.1 (L) 39.0 - 52.0 %   MCV 94.8 80.0 - 100.0 fL   MCH 29.2 26.0 - 34.0 pg   MCHC 30.8 30.0 - 36.0 g/dL   RDW 14.4 11.5 - 15.5 %   Platelets 249 150 - 400 K/uL   nRBC 0.0 0.0 - 0.2 %    Comment: Performed at [facility], [place] [address], [place], [place] [address]  Protime-INR     Status: None   Collection Time: 02/16/21  3:58 AM  Result Value Ref Range   Prothrombin Time 14.9 11.4 - 15.2 seconds   INR 1.2 0.8 - 1.2    Comment: (NOTE) INR goal varies based on device and disease states. Performed at [facility], [place] [address] [place], [place] [address]   APTT     Status: None   Collection Time: 02/16/21  3:58 AM  Result Value Ref Range   aPTT 30 24 - 36 seconds    Comment: Performed at [facility], [place] [address], [place], [place] [address]   No  results found.  Pending Labs [employer] (From admission, onward)    Start     Ordered   02/15/21 1742  SARS CORONAVIRUS 2 (TAT 6-24 HRS) Nasopharyngeal Nasopharyngeal Swab  (Tier 3 - Symptomatic/asymptomatic)  Once,   STAT       Question Answer Comment  Is this test for diagnosis or screening Screening   Symptomatic for COVID-19 as defined by CDC No   Hospitalized for COVID-19 No   Admitted to ICU for COVID-19 No   Previously tested for COVID-19 Yes   Resident in a congregate (group) care setting Yes   Employed in healthcare setting No   Has patient completed COVID vaccination(s) (2 doses of Pfizer/Moderna 1 dose of [employer]) Unknown      02/15/21 1741          Vitals/Pain Today's Vitals   02/15/21 2100 02/16/21 0000 02/16/21 0200 02/16/21 0354  BP: 119/76 98/68 117/73 102/67  Pulse:  77 93 89 81  Resp: '16 17 16 16  '$ Temp:      TempSrc:      SpO2: 98% 100% 99% 100%  PainSc:        Isolation Precautions No active isolations  Medications Medications  melatonin tablet 5 mg (5 mg Oral Given 02/16/21 0345)  zolpidem (AMBIEN) tablet 5 mg (has no administration in time range)  cephALEXin (KEFLEX) capsule 500 mg (500 mg Oral Given 02/16/21 0346)  carvedilol (COREG) tablet 3.125 mg (has no administration in time range)  fenofibrate tablet 160 mg (has no administration in time range)  simvastatin (ZOCOR) tablet 40 mg (has no administration in time range)  spironolactone (ALDACTONE) tablet 12.5 mg (has no administration in time range)  sertraline (ZOLOFT) tablet 50 mg (has no administration in time range)  docusate sodium (COLACE) capsule 100 mg (100 mg Oral Not Given 02/16/21 0346)  lactobacillus (FLORANEX/LACTINEX) granules (has no administration in time range)  pantoprazole (PROTONIX) EC tablet 40 mg (has no administration in time range)  polyethylene glycol (MIRALAX / GLYCOLAX) packet 17 g (has no administration in time range)  finasteride (PROSCAR) tablet 5 mg (has no administration in time range)  tamsulosin (FLOMAX) capsule 0.4 mg (0.4 mg Oral Given 02/16/21 0346)  sodium chloride (OCEAN) 0.65 % nasal spray 2 spray (has no administration in time range)  polyvinyl alcohol (LIQUIFILM TEARS) 1.4 % ophthalmic solution 2 drop (has no administration in time range)  acetaminophen (TYLENOL) tablet 650 mg (has no administration in time range)    Or  acetaminophen (TYLENOL) suppository 650 mg (has no administration in time range)  HYDROcodone-acetaminophen (NORCO/VICODIN) 5-325 MG per tablet 1-2 tablet (has no administration in time range)  morphine 2 MG/ML injection 2 mg (has no administration in time range)  senna-docusate (Senokot-S) tablet 1 tablet (has no administration in time range)  ondansetron (ZOFRAN) tablet 4 mg (has no administration in time range)    Or  ondansetron  (ZOFRAN) injection 4 mg (has no administration in time range)  dextrose 5 %-0.45 % sodium chloride infusion ( Intravenous New Bag/Given 02/16/21 0351)  bisacodyl (DULCOLAX) suppository 10 mg (has no administration in time range)  lidocaine (XYLOCAINE) 2 % jelly 1 application (1 application Urethral Given 02/15/21 1709)  acetaminophen (TYLENOL) tablet 650 mg (650 mg Oral Given 02/16/21 0345)    Mobility walks with device

## 2021-02-16 NOTE — Progress Notes (Signed)
   02/16/21 1607  Assess: MEWS Score  Temp (!) 102.3 F (39.1 C)  BP 121/67  Pulse Rate 79  Resp 16  SpO2 96 %  Assess: MEWS Score  MEWS Temp 2  MEWS Systolic 0  MEWS Pulse 0  MEWS RR 0  MEWS LOC 0  MEWS Score 2  MEWS Score Color Yellow  Assess: if the MEWS score is Yellow or Red  Were vital signs taken at a resting state? Yes  Focused Assessment Change from prior assessment (see assessment flowsheet)  Does the patient meet 2 or more of the SIRS criteria? No  MEWS guidelines implemented *See Row Information* Yes  Treat  MEWS Interventions Administered scheduled meds/treatments;Administered prn meds/treatments  Pain Scale 0-10  Pain Score 2  Pain Type Chronic pain  Take Vital Signs  Increase Vital Sign Frequency  Yellow: Q 2hr X 2 then Q 4hr X 2, if remains yellow, continue Q 4hrs  Escalate  MEWS: Escalate Yellow: discuss with charge nurse/RN and consider discussing with provider and RRT  Notify: Charge Nurse/RN  Name of Charge Nurse/RN Optometrist  Date Charge Nurse/RN Notified 02/16/21  Time Charge Nurse/RN Notified 1615  Notify: Provider  Provider Name/Title Cyndia Skeeters  Date Provider Notified 02/16/21  Time Provider Notified 1614  Notification Type Page  Notification Reason Change in status  Provider response No new orders  Document  Patient Outcome Other (Comment) (monitoring)  Progress note created (see row info) Yes  Assess: SIRS CRITERIA  SIRS Temperature  1  SIRS Pulse 0  SIRS Respirations  0  SIRS WBC 0  SIRS Score Sum  1  Gave tylenol, notified charge nurse and MD, increased frequency of vitals.

## 2021-02-16 NOTE — Evaluation (Addendum)
Physical Therapy Evaluation Patient Details Name: Jesse [name] MRN: [id number] DOB: 05/03/1928 Today's Date: 02/16/2021   History of Present Illness  Jesse [name] is a 87 y.o. male who presents with blood in urine. ED Course: Urinalysis was positive for blood. Dr. Claudia Desanctis placed a 24 French coud tip three-way Foley catheter and irrigated with 1 L saline. Of note, pt was recently admitted 01/22/2021 - 01/26/2021 for abdominal pain with gross hematuria with clots and finding of renal stone; he was treated with Foley catheter (removed prior to discharge), bladder irrigation, urology consult, and cystoscopy with clot evacuation and fulguration on 01/23/2021. PMH: HTN, hyperlipidemia, RBBB, CAD s/p CABG, AAA s/p repair and s/p bilateral femoral aneurysm repair in '95, thoracic aneurysm, GI bleed, prostate cancer s/p XRT, bladder tumor s/p resection, distant repair of bowel obstruction, hernia repair with mesh and recent admission for SBO.   Clinical Impression  Pt admitted with above diagnosis. Pt reports using SPC or RW at baseline for ambulation around ALF and in community, requests supv for showering from staff. Pt currently requiring min A to power to stand and min guard for limited steps over to recliner using RW. Ambulation limited due to lunch tray arrival. Pt denies dizziness or lightheadedness, but reports bladder spasms and scrotal pain in sitting that improves with repositioning. Recommending pt return to ALF with HHPT and 24 hr assist, if unable to provide that assist pt may need short term SNF prior to return to ALF. Pt currently with functional limitations due to the deficits listed below (see PT Problem List). Pt will benefit from skilled PT to increase their independence and safety with mobility to allow discharge to the venue listed below.       Follow Up Recommendations Home health PT;Supervision/Assistance - 24 hour;Other (comment) (back to ALF)    Equipment Recommendations  None recommended by  PT    Recommendations for Other Services       Precautions / Restrictions Precautions Precautions: Fall Precaution Comments: fear of falling Restrictions Weight Bearing Restrictions: No      Mobility  Bed Mobility Overal bed mobility: Needs Assistance Bed Mobility: Supine to Sit  Supine to sit: Min guard;HOB elevated  General bed mobility comments: slow, labored movement    Transfers Overall transfer level: Needs assistance Equipment used: Rolling walker (2 wheeled) Transfers: Sit to/from Stand Sit to Stand: Min assist  General transfer comment: min A to power to stand with BUE assisting  Ambulation/Gait Ambulation/Gait assistance: Min guard Gait Distance (Feet): 6 Feet Assistive device: Rolling walker (2 wheeled) Gait Pattern/deviations: Step-to pattern;Decreased stride length;Trunk flexed Gait velocity: decreased   General Gait Details: trunk slightly flexed, takes slow, short steps over to recliner, verbalizes fear of falling, limited distance due to lunch tray arrival  Stairs            Wheelchair Mobility    Modified Rankin (Stroke Patients Only)       Balance Overall balance assessment: Needs assistance Sitting-balance support: Feet supported Sitting balance-Leahy Scale: Good Sitting balance - Comments: seated EOB   Standing balance support: During functional activity;Bilateral upper extremity supported Standing balance-Leahy Scale: Poor Standing balance comment: reliant on UE support       Pertinent Vitals/Pain Pain Assessment: Faces Faces Pain Scale: Hurts little more Pain Location: scrotum and bladder spasms Pain Descriptors / Indicators: Sore;Grimacing Pain Intervention(s): Limited activity within patient's tolerance;Monitored during session;Repositioned    Home Living Family/patient expects to be discharged to:: Assisted living ([facility])  Home  Equipment: Gilford Rile - 2 wheels;Cane - single point      Prior Function Level  of Independence: Independent with assistive device(s);Needs assistance   Gait / Transfers Assistance Needed: Pt reports using RW or SPC in the home and out in community  ADL's / Homemaking Assistance Needed: Pt requests supv with showering; independent with dressing and toileting.  Comments: Pt reports fear of falling, very cautious     Hand Dominance   Dominant Hand: Right    Extremity/Trunk Assessment   Upper Extremity Assessment Upper Extremity Assessment: Overall WFL for tasks assessed    Lower Extremity Assessment Lower Extremity Assessment: Generalized weakness (AROM WNL, strength grossly 4-/5, denies numbness/tingling throughout)    Cervical / Trunk Assessment Cervical / Trunk Assessment: Normal  Communication   Communication: HOH  Cognition Arousal/Alertness: Awake/alert Behavior During Therapy: WFL for tasks assessed/performed Overall Cognitive Status: Within Functional Limits for tasks assessed    General Comments      Exercises     Assessment/Plan    PT Assessment Patient needs continued PT services  PT Problem List Decreased strength;Decreased activity tolerance;Decreased balance;Decreased mobility;Pain       PT Treatment Interventions DME instruction;Gait training;Functional mobility training;Therapeutic activities;Therapeutic exercise;Balance training;Patient/family education    PT Goals (Current goals can be found in the Care Plan section)  Acute Rehab PT Goals Patient Stated Goal: return to [facility] PT Goal Formulation: With patient Time For Goal Achievement: 03/02/21 Potential to Achieve Goals: Good    Frequency Min 3X/week   Barriers to discharge        Co-evaluation               AM-PAC PT "6 Clicks" Mobility  Outcome Measure Help needed turning from your back to your side while in a flat bed without using bedrails?: A Little Help needed moving from lying on your back to sitting on the side of a flat bed without using bedrails?: A  Little Help needed moving to and from a bed to a chair (including a wheelchair)?: A Little Help needed standing up from a chair using your arms (e.g., wheelchair or bedside chair)?: A Little Help needed to walk in hospital room?: A Little Help needed climbing 3-5 steps with a railing? : A Lot 6 Click Score: 17    End of Session Equipment Utilized During Treatment: Gait belt Activity Tolerance: Patient tolerated treatment well Patient left: in chair;with call bell/phone within reach Nurse Communication: Mobility status PT Visit Diagnosis: Other abnormalities of gait and mobility (R26.89)    Time: [id number] PT Time Calculation (min) (ACUTE ONLY): 15 min   Charges:   PT Evaluation $PT Eval Low Complexity: 1 Low           Jesse [name] PT, DPT 02/16/21, 2:53 PM

## 2021-02-16 NOTE — Progress Notes (Addendum)
PROGRESS NOTE  Jesse [name] [id number] DOB: 1928/02/19   PCP: System, Provider Not In  Patient is from: [facility].  Uses walker at baseline.  DOA: 02/15/2021 LOS: 0  Chief complaints:  Chief Complaint  Patient presents with   Hematuria     Brief Narrative / Interim history: 87 year old M with PMH of CAD/remote CABG, systolic CHF, prostate cancer s/p radiation tx in 2010, HTN, HLD, anemia, recent hospitalization from 7/1-7/5 for abdominal pain with gross hematuria and a renal stone when he was treated with cystoscopy, clot evacuation, fulguration and empiric antibiotics, returning with suprapubic pain and gross hematuria that has gotten worse over the last 2 weeks.  Hemodynamically stable.  H&H at baseline.  Urology consulted and placed 24 French coud tip three-way Foley catheter and irrigated with 1 L saline.  Urine seems to be clearing.  Urology recommending hyperbaric oxygen therapy for possible radiation cystitis and monitoring urine prior to discharge.  Not on CBI.  Subjective: Seen and examined earlier this morning.  No major events overnight of this morning.  No complaints other than some suprapubic discomfort/pain.  Pain is not severe.  He denies nausea or vomiting.  Denies fever or chills.  Objective: Vitals:   02/16/21 0200 02/16/21 0354 02/16/21 0659 02/16/21 1111  BP: 117/73 102/67 121/71 96/65  Pulse: 89 81 70 73  Resp: '16 16 18 16  '$ Temp:   98.5 F (36.9 C) (!) 100.4 F (38 C)  TempSrc:   Oral Oral  SpO2: 99% 100% 98% 98%  Weight:   77.1 kg   Height:   6' (1.829 m)     Intake/Output Summary (Last 24 hours) at 02/16/2021 1319 Last data filed at 02/16/2021 0402 Gross per 24 hour  Intake --  Output 1100 ml  Net -1100 ml   Filed Weights   02/16/21 0659  Weight: 77.1 kg    Examination:  GENERAL: No apparent distress.  Nontoxic. HEENT: MMM.  Vision and hearing grossly intact.  NECK: Supple.  No apparent JVD.  RESP: On RA.  No IWOB.   Fair aeration bilaterally. CVS:  RRR. Heart sounds normal.  ABD/GI/GU: BS+. Abd soft.  Mild suprapubic tenderness.  Pink urine in Foley bag.  See picture below MSK/EXT:  Moves extremities. No apparent deformity. No edema.  SKIN: no apparent skin lesion or wound NEURO: Awake, alert and oriented appropriately.  No apparent focal neuro deficit. PSYCH: Calm. Normal affect.      Procedures:  Foley catheter placement and irrigation  Microbiology summarized: COVID-19 and influenza PCR nonreactive.  Assessment & Plan: Gross hematuria-recurrent issue.  Concern about bleeding from radiation cystitis.  Patient also on low-dose aspirin 4 days prior to admission.  Treated with cystoscopy, clot evacuation and fulguration on 7/2 prior hospitalization.  Urine seems to be clearing up.  H&H stable. -Urology following -Continue indwelling Foley and monitor urine -Urology suggesting hyperbaric oxygen therapy for possible radiation cystitis -Continue home Keflex -Check urine culture-spiked mild fever to 100.4.  Addendum:  Fever: Patient in mild fever to 100.4 earlier today.  Now spiked fever to 102.3.  Hemodynamically stable.  No report of diarrhea per patient's RN.  Seems to develop leukocytosis as well.  Suspect urinary source of infection -Follow urine culture -Check blood culture -Change Keflex to IV ceftriaxone  Acute blood loss anemia?  H&H seems to be stable. Recent Labs    12/15/20 0440 12/16/20 0445 12/17/20 0444 12/24/20 0437 01/22/21 1152 01/23/21 0531 01/24/21 0443 01/25/21 0502 02/15/21 1350  02/16/21 0358  HGB 12.5* 11.2* 11.2* 11.8* 13.3 10.6* 10.2* 9.6* 10.6* 10.2*  -Check anemia panel in the morning -Monitor H&H  History of CAD/CABG-no anginal symptoms. Chronic systolic CHF: TTE in [id number] with LVEF of 40 to 45%.  No cardiopulmonary symptoms. Euvolemic. Essential hypertension: Normotensive. -Continue home Coreg and a statin. -Hold aspirin and Aldactone. -Monitor fluid  status    History of prostate cancer s/p radiation treatment in 2013? -Continue Flomax -Urology following.   GERD without esophagitis -Continue pantoprazole.   Insomnia: -Continue home Ambien and melatonin.     Chronic diarrhea: Has mild fever this morning but no leukocytosis.  Unclear if this is GI or urinary source. -Check C. difficile if further diarrhea while in-house  Debility/ambulatory dysfunction-uses walker at baseline. -PT/OT eval Body mass index is 23.05 kg/m.         DVT prophylaxis:  SCDs Start: 02/16/21 [id number] Place and maintain sequential compression device Start: 02/15/21 1815  Code Status: DNR/DNI Family Communication: Patient and/or RN. Available if any question.  Level of care: Med-Surg Status is: Observation  The patient will require care spanning > 2 midnights and should be moved to inpatient because: Unsafe d/c plan and Inpatient level of care appropriate due to severity of illness  Dispo: The patient is from: ALF              Anticipated d/c is to: ALF              Patient currently is not medically stable to d/c.   Difficult to place patient No       Consultants:  Urology   Sch Meds:  Scheduled Meds:  carvedilol  3.125 mg Oral BID WC   cephALEXin  500 mg Oral TID   Chlorhexidine Gluconate Cloth  6 each Topical Daily   docusate sodium  100 mg Oral BID   fenofibrate  160 mg Oral Daily   finasteride  5 mg Oral Daily   lactobacillus   Oral BID   melatonin  5 mg Oral QHS   pantoprazole  40 mg Oral Daily   polyethylene glycol  17 g Oral Daily   polyvinyl alcohol  2 drop Both Eyes QHS   sertraline  50 mg Oral Daily   simvastatin  40 mg Oral q1800   sodium chloride  2 spray Each Nare BID   spironolactone  12.5 mg Oral Daily   tamsulosin  0.4 mg Oral QHS   Continuous Infusions: PRN Meds:.acetaminophen **OR** acetaminophen, bisacodyl, HYDROcodone-acetaminophen, morphine injection, ondansetron **OR** ondansetron (ZOFRAN) IV,  senna-docusate, zolpidem  Antimicrobials: Anti-infectives (From admission, onward)    Start     Dose/Rate Route Frequency Ordered Stop   02/16/21 0315  cephALEXin (KEFLEX) capsule 500 mg        500 mg Oral 3 times daily 02/16/21 0310          I have personally reviewed the following labs and images: CBC: Recent Labs  Lab 02/15/21 1350 02/16/21 0358  WBC 7.4 10.8*  NEUTROABS 5.7  --   HGB 10.6* 10.2*  HCT 32.8* 33.1*  MCV 91.9 94.8  PLT 284 249   BMP &GFR Recent Labs  Lab 02/15/21 1350 02/16/21 0358  NA 139 135  K 3.8 3.8  CL 109 104  CO2 22 23  GLUCOSE 142* 107*  BUN 26* 21  CREATININE 0.94 0.73  CALCIUM 9.0 9.3   Estimated Creatinine Clearance: 66.9 mL/min (by C-G formula based on SCr of 0.73 mg/dL). Liver & Pancreas: No  results for input(s): AST, ALT, ALKPHOS, BILITOT, PROT, ALBUMIN in the last 168 hours. No results for input(s): LIPASE, AMYLASE in the last 168 hours. No results for input(s): AMMONIA in the last 168 hours. Diabetic: No results for input(s): HGBA1C in the last 72 hours. No results for input(s): GLUCAP in the last 168 hours. Cardiac Enzymes: No results for input(s): CKTOTAL, CKMB, CKMBINDEX, TROPONINI in the last 168 hours. No results for input(s): PROBNP in the last 8760 hours. Coagulation Profile: Recent Labs  Lab 02/16/21 0358  INR 1.2   Thyroid Function Tests: No results for input(s): TSH, T4TOTAL, FREET4, T3FREE, THYROIDAB in the last 72 hours. Lipid Profile: No results for input(s): CHOL, HDL, LDLCALC, TRIG, CHOLHDL, LDLDIRECT in the last 72 hours. Anemia Panel: No results for input(s): VITAMINB12, FOLATE, FERRITIN, TIBC, IRON, RETICCTPCT in the last 72 hours. Urine analysis:    Component Value Date/Time   COLORURINE RED (A) 02/15/2021 1341   APPEARANCEUR TURBID (A) 02/15/2021 1341   LABSPEC  02/15/2021 1341    TEST NOT REPORTED DUE TO COLOR INTERFERENCE OF URINE PIGMENT   PHURINE  02/15/2021 1341    TEST NOT REPORTED DUE  TO COLOR INTERFERENCE OF URINE PIGMENT   GLUCOSEU (A) 02/15/2021 1341    TEST NOT REPORTED DUE TO COLOR INTERFERENCE OF URINE PIGMENT   HGBUR (A) 02/15/2021 1341    TEST NOT REPORTED DUE TO COLOR INTERFERENCE OF URINE PIGMENT   BILIRUBINUR (A) 02/15/2021 1341    TEST NOT REPORTED DUE TO COLOR INTERFERENCE OF URINE PIGMENT   KETONESUR (A) 02/15/2021 1341    TEST NOT REPORTED DUE TO COLOR INTERFERENCE OF URINE PIGMENT   PROTEINUR (A) 02/15/2021 1341    TEST NOT REPORTED DUE TO COLOR INTERFERENCE OF URINE PIGMENT   UROBILINOGEN 2.0 (H) 05/28/2015 0029   NITRITE (A) 02/15/2021 1341    TEST NOT REPORTED DUE TO COLOR INTERFERENCE OF URINE PIGMENT   LEUKOCYTESUR (A) 02/15/2021 1341    TEST NOT REPORTED DUE TO COLOR INTERFERENCE OF URINE PIGMENT   Sepsis Labs: Invalid input(s): PROCALCITONIN, [place]  Microbiology: Recent Results (from the past 240 hour(s))  SARS CORONAVIRUS 2 (TAT 6-24 HRS) Nasopharyngeal Nasopharyngeal Swab     Status: None   Collection Time: 02/15/21  6:05 PM   Specimen: Nasopharyngeal Swab  Result Value Ref Range Status   SARS Coronavirus 2 NEGATIVE NEGATIVE Final    Comment: (NOTE) SARS-CoV-2 target nucleic acids are NOT DETECTED.  The SARS-CoV-2 RNA is generally detectable in upper and lower respiratory specimens during the acute phase of infection. Negative results do not preclude SARS-CoV-2 infection, do not rule out co-infections with other pathogens, and should not be used as the sole basis for treatment or other patient management decisions. Negative results must be combined with clinical observations, patient history, and epidemiological information. The expected result is Negative.  Fact Sheet for Patients: SugarRoll.be  Fact Sheet for Healthcare Providers: https://www.woods-mathews.com/  This test is not yet approved or cleared by the [place] FDA and  has been authorized for detection and/or  diagnosis of SARS-CoV-2 by FDA under an Emergency Use Authorization (EUA). This EUA will remain  in effect (meaning this test can be used) for the duration of the COVID-19 declaration under Se ction 564(b)(1) of the Act, 21 U.S.C. section 360bbb-3(b)(1), unless the authorization is terminated or revoked sooner.  Performed at [facility], [place] [address], [place], [place] [address]     Radiology Studies: No results found.    Tera Pellicane T.  [place]  If 7PM-7AM, please contact night-coverage [email or web address] 02/16/2021, 1:19 PM

## 2021-02-17 DIAGNOSIS — I10 Essential (primary) hypertension: Secondary | ICD-10-CM

## 2021-02-17 DIAGNOSIS — I5022 Chronic systolic (congestive) heart failure: Secondary | ICD-10-CM | POA: Diagnosis not present

## 2021-02-17 DIAGNOSIS — R310 Gross hematuria: Secondary | ICD-10-CM | POA: Diagnosis not present

## 2021-02-17 LAB — FERRITIN: Ferritin: 236 ng/mL (ref 24–336)

## 2021-02-17 LAB — RENAL FUNCTION PANEL
Albumin: 3.4 g/dL — ABNORMAL LOW (ref 3.5–5.0)
Anion gap: 8 (ref 5–15)
BUN: 22 mg/dL (ref 8–23)
CO2: 24 mmol/L (ref 22–32)
Calcium: 9.2 mg/dL (ref 8.9–10.3)
Chloride: 103 mmol/L (ref 98–111)
Creatinine, Ser: 0.87 mg/dL (ref 0.61–1.24)
GFR, Estimated: >60 mL/min (ref >60)
Glucose, Bld: 119 mg/dL — ABNORMAL HIGH (ref 70–99)
Phosphorus: 2.3 mg/dL — ABNORMAL LOW (ref 2.5–4.6)
Potassium: 4 mmol/L (ref 3.5–5.1)
Sodium: 135 mmol/L (ref 135–145)

## 2021-02-17 LAB — CBC
HCT: 33 % — ABNORMAL LOW (ref 39.0–52.0)
Hemoglobin: 10.2 g/dL — ABNORMAL LOW (ref 13.0–17.0)
MCH: 29.8 pg (ref 26.0–34.0)
MCHC: 30.9 g/dL (ref 30.0–36.0)
MCV: 96.5 fL (ref 80.0–100.0)
Platelets: 201 10*3/uL (ref 150–400)
RBC: 3.42 MIL/uL — ABNORMAL LOW (ref 4.22–5.81)
RDW: 14.3 % (ref 11.5–15.5)
WBC: 6.5 10*3/uL (ref 4.0–10.5)
nRBC: 0 % (ref 0.0–0.2)

## 2021-02-17 LAB — IRON AND TIBC
Iron: 15 ug/dL — ABNORMAL LOW (ref 45–182)
Saturation Ratios: 5 % — ABNORMAL LOW (ref 17.9–39.5)
TIBC: 304 ug/dL (ref 250–450)
UIBC: 289 ug/dL

## 2021-02-17 LAB — RETICULOCYTES
Immature Retic Fract: 10.3 % (ref 2.3–15.9)
RBC.: 3.38 MIL/uL — ABNORMAL LOW (ref 4.22–5.81)
Retic Count, Absolute: 65.9 10*3/uL (ref 19.0–186.0)
Retic Ct Pct: 2 % (ref 0.4–3.1)

## 2021-02-17 LAB — MAGNESIUM: Magnesium: 2 mg/dL (ref 1.7–2.4)

## 2021-02-17 LAB — FOLATE: Folate: 8.7 ng/mL (ref >5.9)

## 2021-02-17 LAB — VITAMIN B12: Vitamin B-12: 277 pg/mL (ref 180–914)

## 2021-02-17 MED ORDER — SODIUM CHLORIDE 0.9 % IV SOLN
250.0000 mg | INTRAVENOUS | Status: AC
  Administered 2021-02-17 – 2021-02-18 (×2): 250 mg via INTRAVENOUS
  Filled 2021-02-17 (×2): qty 20

## 2021-02-17 NOTE — Progress Notes (Signed)
  Subjective: Patient feeling much better.  Had fever yesterday but seems to have resolved overnight.  His urine remains clear today in the tubing and Foley bag.  Objective: Vital signs in last 24 hours: Temp:  [98 F (36.7 C)-102.3 F (39.1 C)] 98.2 F (36.8 C) (07/27 0420) Pulse Rate:  [69-83] 71 (07/27 0420) Resp:  [16-18] 18 (07/27 0420) BP: (95-121)/(60-86) 120/80 (07/27 0420) SpO2:  [95 %-98 %] 97 % (07/27 0420) Weight:  [78.6 kg] 78.6 kg (07/27 0442)  Intake/Output from previous day: 07/26 0701 - 07/27 0700 In: 240 [P.O.:240] Out: 1005 [Urine:1005] Intake/Output this shift: Total I/O In: 240 [P.O.:240] Out: -   Physical Exam:  General: Alert and oriented   Lab Results: Recent Labs    02/15/21 1350 02/16/21 0358 02/17/21 0455  HGB 10.6* 10.2* 10.2*  HCT 32.8* 33.1* 33.0*   BMET Recent Labs    02/16/21 0358 02/17/21 0455  NA 135 135  K 3.8 4.0  CL 104 103  CO2 23 24  GLUCOSE 107* 119*  BUN 21 22  CREATININE 0.73 0.87  CALCIUM 9.3 9.2     Studies/Results: No results found.  Assessment/Plan: Gross hematuria likely secondary to radiation cystitis now resolved with Foley catheter. I will recommend leaving the catheter until 02/22/21 and then see back in office for foley removal/voiding trial.  I have spoken with the wound center that manages hyperbaric oxygen therapy and they are going to see the patient as outpatient consult, I am facilitating that referral as soon as possible.  I think he could be discharged home with Foley catheter if otherwise medically stable.    LOS: 0 days   Remi Haggard 02/17/2021, 11:13 AM

## 2021-02-17 NOTE — Progress Notes (Signed)
Pt noted to have dark red, bloody urine in foley catheter tubing. Pt endorses no new onset of abdominal pain, and urine flowing well at this time with no clots present. MD Milford Cage made aware. Orders given to monitor for thickening and the presence of clots, and if present to page on call. Will continue to monitor patient closely.

## 2021-02-17 NOTE — NC FL2 (Signed)
[place] LEVEL OF CARE SCREENING TOOL     IDENTIFICATION  Patient Name: Jesse [name] Birthdate: 09-12-1927 Sex: male Admission Date (Current Location): 02/15/2021  [facility] and [place] Number:  Herbalist and Address:  [facility],  [place], [place]      Provider Number: [id number]  Attending Physician Name and Address:  Donne Hazel, MD  Relative Name and Phone Number:  Larrie Kass [phone]  [phone]  Loni Dolly Daughter [phone]  [phone]    Current Level of Care: Hospital Recommended Level of Care: [place] Prior Approval Number:    Date Approved/Denied:   PASRR Number:    Discharge Plan: Other (Comment) (Return-[facility])    Current Diagnoses: Patient Active Problem List   Diagnosis Date Noted   GERD without esophagitis 02/15/2021   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   Gross hematuria 01/23/2021   Palliative care by specialist    SBO (small bowel obstruction) ([place]) 12/12/2020   Abducens (sixth) nerve palsy, left 02/20/2020   Binocular vision disorder with diplopia 02/20/2020   History of prostate cancer 08/15/2019   Anxiety 06/21/2019   Anemia due to chronic illness 06/20/2019   Pneumonia due to COVID-19 virus 06/20/2019   Displaced spiral fracture of shaft of right femur, initial encounter for open fracture type I or II ([place]) 06/20/2019   COVID-19 virus infection 06/19/2019   Goals of care, counseling/discussion    Hip fracture ([place]) 04/15/2019   Fall    Coronary artery disease involving native coronary artery of native heart without angina pectoris 11/20/2018   Thoracic aortic aneurysm without rupture ([place]) [id number]   Chronic systolic heart failure ([place]) 11/20/2018   Duodenal ulcer with hemorrhage 05/05/2018   Hemorrhagic shock ([place]) 05/05/2018   AKI (acute kidney injury) ([place]) 05/05/2018   Orthostatic hypotension 05/04/2018   Anemia associated with  acute blood loss 05/04/2018   Melena 05/04/2018   GI bleed 05/04/2018   Patella fracture 06/09/2015   Aftercare following surgery of the circulatory system, NEC 01/08/2013   Pain in limb- Left popliteal 12/25/2012   Aneurysm artery, 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/2012   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   Aneurysm of abdominal vessel (HCC) 02/21/2012   Iliac artery aneurysm, bilateral (HCC) 08/09/2011   Aneurysm of artery of lower extremity ([place]) 08/09/2011   HLD (hyperlipidemia) 01/16/2009   Essential hypertension 01/16/2009   CAD, ARTERY BYPASS GRAFT 01/16/2009   PERIPHERAL VASCULAR DISEASE 01/16/2009   ABDOMINAL AORTIC ANEURYSM REPAIR, HX OF 01/16/2009   Mixed hyperlipidemia 12/28/2008    Orientation RESPIRATION BLADDER Height & Weight     Self, Time, Situation, Place  Normal Continent Weight: 78.6 kg Height:  6' (182.9 cm)  BEHAVIORAL SYMPTOMS/MOOD NEUROLOGICAL BOWEL NUTRITION STATUS      Continent Diet (Regular)  AMBULATORY STATUS COMMUNICATION OF NEEDS Skin   Supervision Verbally Normal                       Personal Care Assistance Level of Assistance  Bathing, Feeding, Dressing Bathing Assistance: Limited assistance Feeding assistance: Limited assistance Dressing Assistance: Limited assistance     Functional Limitations Info  Sight, Hearing, Speech Sight Info: Adequate Hearing Info: Adequate Speech Info: Adequate    SPECIAL CARE FACTORS FREQUENCY  OT (By licensed OT), PT (By licensed PT)     PT Frequency: 3x week OT Frequency: 3x week            Contractures Contractures Info: Not present  Additional Factors Info  Code Status, Allergies, Psychotropic Code Status Info:  (DNR) Allergies Info:  (Quinolones, Adhesive (Tape)) Psychotropic Info:  (Ambien '5mg'$  po hs prn)         Current Medications (02/17/2021):  This is the current hospital active medication list Current Facility-Administered Medications  Medication Dose Route Frequency Provider  Last Rate Last Admin   acetaminophen (TYLENOL) tablet 650 mg  650 mg Oral Q6H PRN Tacey Ruiz, MD   650 mg at 02/16/21 1611   Or   acetaminophen (TYLENOL) suppository 650 mg  650 mg Rectal Q6H PRN Tacey Ruiz, MD       bisacodyl (DULCOLAX) suppository 10 mg  10 mg Rectal Daily PRN Tacey Ruiz, MD       carvedilol (COREG) tablet 3.125 mg  3.125 mg Oral BID WC Tacey Ruiz, MD   3.125 mg at 02/17/21 0909   cefTRIAXone (ROCEPHIN) 1 g in sodium chloride 0.9 % 100 mL IVPB  1 g Intravenous Q24H Wendee Beavers T, MD 200 mL/hr at 02/16/21 1708 1 g at 02/16/21 1708   Chlorhexidine Gluconate Cloth 2 % PADS 6 each  6 each Topical Daily Wendee Beavers T, MD   6 each at 02/16/21 1003   docusate sodium (COLACE) capsule 100 mg  100 mg Oral BID Tacey Ruiz, MD   100 mg at 02/16/21 2110   fenofibrate tablet 160 mg  160 mg Oral Daily Tacey Ruiz, MD   160 mg at 02/17/21 0913   finasteride (PROSCAR) tablet 5 mg  5 mg Oral Daily Tacey Ruiz, MD   5 mg at 02/17/21 0910   HYDROcodone-acetaminophen (NORCO/VICODIN) 5-325 MG per tablet 1-2 tablet  1-2 tablet Oral Q4H PRN Tacey Ruiz, MD   1 tablet at 02/16/21 2118   lactobacillus (FLORANEX/LACTINEX) granules   Oral BID Tacey Ruiz, MD   1 g at 02/16/21 2110   melatonin tablet 5 mg  5 mg Oral QHS Tacey Ruiz, MD   5 mg at 02/16/21 2110   morphine 2 MG/ML injection 2 mg  2 mg Intravenous Q2H PRN Tacey Ruiz, MD       ondansetron (ZOFRAN) tablet 4 mg  4 mg Oral Q6H PRN Tacey Ruiz, MD       Or   ondansetron (ZOFRAN) injection 4 mg  4 mg Intravenous Q6H PRN Tacey Ruiz, MD       pantoprazole (PROTONIX) EC tablet 40 mg  40 mg Oral Daily Tacey Ruiz, MD   40 mg at 02/17/21 0909   polyethylene glycol (MIRALAX / GLYCOLAX) packet 17 g  17 g Oral Daily Tacey Ruiz, MD   17 g at 02/16/21 1002   polyvinyl alcohol (LIQUIFILM TEARS) 1.4 % ophthalmic solution 2 drop  2 drop Both Eyes QHS Tacey Ruiz, MD   2 drop at 02/16/21 2111    senna-docusate (Senokot-S) tablet 1 tablet  1 tablet Oral QHS PRN Tacey Ruiz, MD       sertraline (ZOLOFT) tablet 50 mg  50 mg Oral Daily Tacey Ruiz, MD   50 mg at 02/17/21 0909   simvastatin (ZOCOR) tablet 40 mg  40 mg Oral q1800 Tacey Ruiz, MD   40 mg at 02/16/21 1714   sodium chloride (OCEAN) 0.65 % nasal spray 2 spray  2 spray Each Nare BID Tacey Ruiz, MD   2 spray at 02/17/21 0913   spironolactone (ALDACTONE) tablet 12.5 mg  12.5 mg Oral Daily Tacey Ruiz, MD   12.5 mg at 02/17/21 0912   tamsulosin (FLOMAX) capsule  0.4 mg  0.4 mg Oral QHS Tacey Ruiz, MD   0.4 mg at 02/16/21 2110   zolpidem (AMBIEN) tablet 5 mg  5 mg Oral QHS PRN Tacey Ruiz, MD         Discharge Medications: Please see discharge summary for a list of discharge medications.  Relevant Imaging Results:  Relevant Lab Results:   Additional Information  ([id number])  Cedric Mcclaine, Juliann Pulse, RN

## 2021-02-17 NOTE — Progress Notes (Signed)
PROGRESS NOTE    Jesse [name]  [id number] DOB: Nov 15, 1927 DOA: 02/15/2021 PCP: [name], MD    Brief Narrative:  87 year old M with PMH of CAD/remote CABG, systolic CHF, prostate cancer s/p radiation tx in 2010, HTN, HLD, anemia, recent hospitalization from 7/1-7/5 for abdominal pain with gross hematuria and a renal stone when he was treated with cystoscopy, clot evacuation, fulguration and empiric antibiotics, returning with suprapubic pain and gross hematuria that has gotten worse over the last 2 weeks.  Hemodynamically stable.  H&H at baseline.  Urology consulted and placed 24 French coud tip three-way Foley catheter and irrigated with 1 L saline.  Urine seems to be clearing.  Urology recommending hyperbaric oxygen therapy for possible radiation cystitis and monitoring urine prior to discharge.  Not on CBI.  Assessment & Plan:   Principal Problem:   Gross hematuria Active Problems:   Mixed hyperlipidemia   Essential hypertension   Anemia associated with acute blood loss   Coronary artery disease involving native coronary artery of native heart without angina pectoris   Chronic systolic heart failure ([place])   History of prostate cancer   GERD without esophagitis   Radiation cystitis  Fever secondary to possible UTI:  -Recently noted to have fever to 102.3 -Remains hemodynamically stable -Urine culture pending -Continued on rocephin -Now afebrile   Acute blood loss anemia -H&H seems to be stable. -Check anemia panel in the morning -Iron level 15 -Will prescribe IV iron   History of CAD/CABG-no anginal symptoms. Chronic systolic CHF: TTE in [id number] with LVEF of 40 to 45%.  No cardiopulmonary symptoms. Euvolemic on exam Essential hypertension: Normotensive. -Continue home Coreg and a statin. -Holding aspirin and Aldactone. -Cont to monitor volume status    History of prostate cancer s/p radiation treatment in 2013? -Continue Flomax -Urology following with  recommendations for close follow up and to keep cath in until 8/1   GERD without esophagitis -Continue pantoprazole.   Insomnia: -Continue home Ambien and melatonin.     Chronic diarrhea:  -No further diarrhea documented   Debility/ambulatory dysfunction-uses walker at baseline. -PT/OT eval Body mass index is 23.05 kg/m.  DVT prophylaxis: SCD's Code Status: DNR Family Communication: Pt in room, family not at bedside  Status is: Observation  The patient remains OBS appropriate and will d/c before 2 midnights.  Dispo: The patient is from: ALF              Anticipated d/c is to: ALF              Patient currently is not medically stable to d/c.   Difficult to place patient No       Consultants:  Urology  Procedures:    Antimicrobials: Anti-infectives (From admission, onward)    Start     Dose/Rate Route Frequency Ordered Stop   02/16/21 1700  cefTRIAXone (ROCEPHIN) 1 g in sodium chloride 0.9 % 100 mL IVPB        1 g 200 mL/hr over 30 Minutes Intravenous Every 24 hours 02/16/21 1630 02/21/21 1659   02/16/21 0315  cephALEXin (KEFLEX) capsule 500 mg  Status:  Discontinued        500 mg Oral 3 times daily 02/16/21 0310 02/16/21 1630       Subjective: Reports feeling better today  Objective: Vitals:   02/17/21 0004 02/17/21 0420 02/17/21 0442 02/17/21 1238  BP: 111/60 120/80  116/74  Pulse: 70 71  71  Resp: '17 18  16  '$ Temp: 98.1 F (  36.7 C) 98.2 F (36.8 C)  98.9 F (37.2 C)  TempSrc:  Oral  Oral  SpO2: 95% 97%  98%  Weight:   78.6 kg   Height:        Intake/Output Summary (Last 24 hours) at 02/17/2021 1739 Last data filed at 02/17/2021 0950 Gross per 24 hour  Intake 360 ml  Output 380 ml  Net -20 ml   Filed Weights   02/16/21 0659 02/17/21 0442  Weight: 77.1 kg 78.6 kg    Examination: General exam: Awake, laying in bed, in nad Respiratory system: Normal respiratory effort, no wheezing Cardiovascular system: regular rate, s1,  s2 Gastrointestinal system: Soft, nondistended, positive BS Central nervous system: CN2-12 grossly intact, strength intact Extremities: Perfused, no clubbing Skin: Normal skin turgor, no notable skin lesions seen Psychiatry: Mood normal // no visual hallucinations   Data Reviewed: I have personally reviewed following labs and imaging studies  CBC: Recent Labs  Lab 02/15/21 1350 02/16/21 0358 02/17/21 0455  WBC 7.4 10.8* 6.5  NEUTROABS 5.7  --   --   HGB 10.6* 10.2* 10.2*  HCT 32.8* 33.1* 33.0*  MCV 91.9 94.8 96.5  PLT 284 249 [id number]   Basic Metabolic Panel: Recent Labs  Lab 02/15/21 1350 02/16/21 0358 02/17/21 0455  NA 139 135 135  K 3.8 3.8 4.0  CL 109 104 103  CO2 '22 23 24  '$ GLUCOSE 142* 107* 119*  BUN 26* 21 22  CREATININE 0.94 0.73 0.87  CALCIUM 9.0 9.3 9.2  MG  --   --  2.0  PHOS  --   --  2.3*   GFR: Estimated Creatinine Clearance: 61.9 mL/min (by C-G formula based on SCr of 0.87 mg/dL). Liver Function Tests: Recent Labs  Lab 02/17/21 0455  ALBUMIN 3.4*   No results for input(s): LIPASE, AMYLASE in the last 168 hours. No results for input(s): AMMONIA in the last 168 hours. Coagulation Profile: Recent Labs  Lab 02/16/21 0358  INR 1.2   Cardiac Enzymes: No results for input(s): CKTOTAL, CKMB, CKMBINDEX, TROPONINI in the last 168 hours. BNP (last 3 results) No results for input(s): PROBNP in the last 8760 hours. HbA1C: No results for input(s): HGBA1C in the last 72 hours. CBG: No results for input(s): GLUCAP in the last 168 hours. Lipid Profile: No results for input(s): CHOL, HDL, LDLCALC, TRIG, CHOLHDL, LDLDIRECT in the last 72 hours. Thyroid Function Tests: No results for input(s): TSH, T4TOTAL, FREET4, T3FREE, THYROIDAB in the last 72 hours. Anemia Panel: Recent Labs    02/17/21 0455  VITAMINB12 277  FOLATE 8.7  FERRITIN 236  TIBC 304  IRON 15*  RETICCTPCT 2.0   Sepsis Labs: No results for input(s): PROCALCITON, LATICACIDVEN in the last  168 hours.  Recent Results (from the past 240 hour(s))  SARS CORONAVIRUS 2 (TAT 6-24 HRS) Nasopharyngeal Nasopharyngeal Swab     Status: None   Collection Time: 02/15/21  6:05 PM   Specimen: Nasopharyngeal Swab  Result Value Ref Range Status   SARS Coronavirus 2 NEGATIVE NEGATIVE Final    Comment: (NOTE) SARS-CoV-2 target nucleic acids are NOT DETECTED.  The SARS-CoV-2 RNA is generally detectable in upper and lower respiratory specimens during the acute phase of infection. Negative results do not preclude SARS-CoV-2 infection, do not rule out co-infections with other pathogens, and should not be used as the sole basis for treatment or other patient management decisions. Negative results must be combined with clinical observations, patient history, and epidemiological information. The expected result is  Negative.  Fact Sheet for Patients: SugarRoll.be  Fact Sheet for Healthcare Providers: https://www.woods-mathews.com/  This test is not yet approved or cleared by the [place] FDA and  has been authorized for detection and/or diagnosis of SARS-CoV-2 by FDA under an Emergency Use Authorization (EUA). This EUA will remain  in effect (meaning this test can be used) for the duration of the COVID-19 declaration under Se ction 564(b)(1) of the Act, 21 U.S.C. section 360bbb-3(b)(1), unless the authorization is terminated or revoked sooner.  Performed at [facility], [place] [address], [place], [place] [address]   Culture, blood (routine x 2)     Status: None (Preliminary result)   Collection Time: 02/16/21  5:12 PM   Specimen: BLOOD  Result Value Ref Range Status   Specimen Description   Final    BLOOD LEFT HAND Performed at [facility] [address], [place], [place] [address]    Special Requests   Final    BOTTLES DRAWN AEROBIC AND ANAEROBIC Blood Culture adequate volume Performed at [facility] [address], [place], [place] [address]    Culture   Final    NO GROWTH < 24 HOURS Performed at [facility] [address], [place], [place] [address]    Report Status PENDING  Incomplete  Culture, blood (routine x 2)     Status: None (Preliminary result)   Collection Time: 02/16/21  5:12 PM   Specimen: BLOOD  Result Value Ref Range Status   Specimen Description   Final    BLOOD RIGHT ANTECUBITAL Performed at [facility] [address], [place], [place] [address]    Special Requests   Final    BOTTLES DRAWN AEROBIC AND ANAEROBIC Blood Culture adequate volume Performed at [facility] [address] [place], [place] [address]    Culture   Final    NO GROWTH < 24 HOURS Performed at [facility] [address], [place], [place] [address]    Report Status PENDING  Incomplete     Radiology Studies: No results found.  Scheduled Meds:  carvedilol  3.125 mg Oral BID WC   Chlorhexidine Gluconate Cloth  6 each Topical Daily   docusate sodium  100 mg Oral BID   fenofibrate  160 mg Oral Daily   finasteride  5 mg Oral Daily   lactobacillus   Oral BID   melatonin  5 mg Oral QHS   pantoprazole  40 mg Oral Daily   polyethylene glycol  17 g Oral Daily   polyvinyl alcohol  2 drop Both Eyes QHS   sertraline  50 mg Oral Daily   simvastatin  40 mg Oral q1800   sodium chloride  2 spray Each Nare BID   spironolactone  12.5 mg Oral Daily   tamsulosin  0.4 mg Oral QHS   Continuous Infusions:  cefTRIAXone (ROCEPHIN)  IV 1 g (02/17/21 1737)     LOS: 0 days   Marylu Lund, MD [facility] Pager On Amion  If 7PM-7AM, please contact night-coverage 02/17/2021, 5:39 PM

## 2021-02-18 DIAGNOSIS — I10 Essential (primary) hypertension: Secondary | ICD-10-CM | POA: Diagnosis not present

## 2021-02-18 DIAGNOSIS — R310 Gross hematuria: Secondary | ICD-10-CM | POA: Diagnosis not present

## 2021-02-18 DIAGNOSIS — I5022 Chronic systolic (congestive) heart failure: Secondary | ICD-10-CM | POA: Diagnosis not present

## 2021-02-18 LAB — CBC
HCT: 31.3 % — ABNORMAL LOW (ref 39.0–52.0)
Hemoglobin: 9.7 g/dL — ABNORMAL LOW (ref 13.0–17.0)
MCH: 29.5 pg (ref 26.0–34.0)
MCHC: 31 g/dL (ref 30.0–36.0)
MCV: 95.1 fL (ref 80.0–100.0)
Platelets: 185 10*3/uL (ref 150–400)
RBC: 3.29 MIL/uL — ABNORMAL LOW (ref 4.22–5.81)
RDW: 14.1 % (ref 11.5–15.5)
WBC: 5.5 10*3/uL (ref 4.0–10.5)
nRBC: 0 % (ref 0.0–0.2)

## 2021-02-18 NOTE — NC FL2 (Signed)
[place] LEVEL OF CARE SCREENING TOOL     IDENTIFICATION  Patient Name: Jesse [name] Birthdate: 08/21/1927 Sex: male Admission Date (Current Location): 02/15/2021  [facility] and [place] Number:  Herbalist and Address:  [facility],  [place], [place]      Provider Number: [id number]  Attending Physician Name and Address:  Donne Hazel, MD  Relative Name and Phone Number:  Larrie Kass [phone]  [phone]  Loni Dolly Daughter [phone]  [phone]    Current Level of Care: Hospital Recommended Level of Care: [place] Prior Approval Number:    Date Approved/Denied:   PASRR Number:    Discharge Plan:  [facility] @ [facility])    Current Diagnoses: Patient Active Problem List   Diagnosis Date Noted   GERD without esophagitis 02/15/2021   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   Gross hematuria 01/23/2021   Palliative care by specialist    SBO (small bowel obstruction) ([place]) 12/12/2020   Abducens (sixth) nerve palsy, left 02/20/2020   Binocular vision disorder with diplopia 02/20/2020   History of prostate cancer 08/15/2019   Anxiety 06/21/2019   Anemia due to chronic illness 06/20/2019   Pneumonia due to COVID-19 virus 06/20/2019   Displaced spiral fracture of shaft of right femur, initial encounter for open fracture type I or II ([place]) 06/20/2019   COVID-19 virus infection 06/19/2019   Goals of care, counseling/discussion    Hip fracture ([place]) 04/15/2019   Fall    Coronary artery disease involving native coronary artery of native heart without angina pectoris 11/20/2018   Thoracic aortic aneurysm without rupture ([place]) [id number]   Chronic systolic heart failure ([place]) 11/20/2018   Duodenal ulcer with hemorrhage 05/05/2018   Hemorrhagic shock (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   Aneurysm of abdominal vessel (HCC) 02/21/2012   Iliac artery aneurysm, bilateral (HCC) 08/09/2011   Aneurysm of artery of lower extremity ([place]) 08/09/2011   HLD (hyperlipidemia) 01/16/2009   Essential hypertension 01/16/2009   CAD, ARTERY BYPASS GRAFT 01/16/2009   PERIPHERAL VASCULAR DISEASE 01/16/2009   ABDOMINAL AORTIC ANEURYSM REPAIR, HX OF 01/16/2009   Mixed hyperlipidemia 12/28/2008    Orientation RESPIRATION BLADDER Height & Weight     Self, Time, Situation, Place  Normal Indwelling catheter Weight: 82.4 kg Height:  6' (182.9 cm)  BEHAVIORAL SYMPTOMS/MOOD NEUROLOGICAL BOWEL NUTRITION STATUS      Continent Diet (Regular)  AMBULATORY STATUS COMMUNICATION OF NEEDS Skin   Supervision Verbally Normal                       Personal Care Assistance Level of Assistance  Bathing, Feeding, Dressing Bathing Assistance: Limited assistance Feeding assistance: Limited assistance Dressing Assistance: Limited assistance     Functional Limitations Info  Sight, Hearing, Speech Sight Info: Adequate Hearing Info: Adequate Speech Info: Adequate    SPECIAL CARE FACTORS FREQUENCY  OT (By licensed OT), PT (By licensed PT)     PT Frequency: 3x week OT Frequency: 3x week            Contractures Contractures Info: Not present  Additional Factors Info  Code Status, Allergies, Psychotropic Code Status Info:  (DNR) Allergies Info:  (Quinolones, Adhesive (Tape)) Psychotropic Info:  (Ambien '5mg'$  po hs prn)         Current Medications (02/18/2021):  This is the current hospital active medication list Current Facility-Administered Medications  Medication Dose Route Frequency Provider Last  Rate Last Admin   acetaminophen (TYLENOL) tablet 650 mg  650 mg Oral Q6H PRN Tacey Ruiz, MD   650 mg at 02/16/21 1611   Or   acetaminophen (TYLENOL) suppository 650 mg  650 mg Rectal Q6H PRN Tacey Ruiz, MD       bisacodyl (DULCOLAX) suppository 10 mg  10 mg Rectal Daily PRN Tacey Ruiz, MD       carvedilol (COREG) tablet 3.125 mg  3.125 mg Oral BID WC Tacey Ruiz, MD   3.125 mg at 02/18/21 0733   cefTRIAXone (ROCEPHIN) 1 g in sodium chloride 0.9 % 100 mL IVPB  1 g Intravenous Q24H Wendee Beavers T, MD 200 mL/hr at 02/17/21 1737 1 g at 02/17/21 1737   Chlorhexidine Gluconate Cloth 2 % PADS 6 each  6 each Topical Daily Mercy Riding, MD   6 each at 02/18/21 0940   docusate sodium (COLACE) capsule 100 mg  100 mg Oral BID Tacey Ruiz, MD   100 mg at 02/18/21 [id number]   fenofibrate tablet 160 mg  160 mg Oral Daily Tacey Ruiz, MD   160 mg at 02/18/21 [id number]   ferric gluconate (FERRLECIT) 250 mg in sodium chloride 0.9 % 250 mL IVPB  250 mg Intravenous Q24H Donne Hazel, MD 270 mL/hr at 02/17/21 2322 250 mg at 02/17/21 2322   finasteride (PROSCAR) tablet 5 mg  5 mg Oral Daily Tacey Ruiz, MD   5 mg at 02/18/21 [id number]   HYDROcodone-acetaminophen (NORCO/VICODIN) 5-325 MG per tablet 1-2 tablet  1-2 tablet Oral Q4H PRN Tacey Ruiz, MD   1 tablet at 02/17/21 2133   lactobacillus (FLORANEX/LACTINEX) granules   Oral BID Tacey Ruiz, MD   1 g at 02/18/21 [id number]   melatonin tablet 5 mg  5 mg Oral QHS Tacey Ruiz, MD   5 mg at 02/17/21 2134   morphine 2 MG/ML injection 2 mg  2 mg Intravenous Q2H PRN Tacey Ruiz, MD       ondansetron (ZOFRAN) tablet 4 mg  4 mg Oral Q6H PRN Tacey Ruiz, MD       Or   ondansetron (ZOFRAN) injection 4 mg  4 mg Intravenous Q6H PRN Tacey Ruiz, MD       pantoprazole (PROTONIX) EC tablet 40 mg  40 mg Oral Daily Tacey Ruiz, MD   40 mg at 02/18/21 0937   polyethylene glycol (MIRALAX / GLYCOLAX) packet 17 g  17 g Oral Daily Tacey Ruiz,  MD   17 g at 02/18/21 [id number]   polyvinyl alcohol (LIQUIFILM TEARS) 1.4 % ophthalmic solution 2 drop  2 drop Both Eyes QHS Tacey Ruiz, MD   2 drop at 02/17/21 2134   senna-docusate (Senokot-S) tablet 1 tablet  1 tablet Oral QHS PRN Tacey Ruiz, MD       sertraline (ZOLOFT) tablet 50 mg  50 mg Oral Daily Tacey Ruiz, MD   50 mg at 02/18/21 0937   simvastatin (ZOCOR) tablet 40 mg  40 mg Oral q1800 Tacey Ruiz, MD   40 mg at 02/17/21 1730   sodium chloride (OCEAN) 0.65 % nasal spray 2 spray  2 spray Each Nare BID Tacey Ruiz, MD  2 spray at 02/18/21 0944   spironolactone (ALDACTONE) tablet 12.5 mg  12.5 mg Oral Daily Tacey Ruiz, MD   12.5 mg at 02/18/21 [id number]   tamsulosin (FLOMAX) capsule 0.4 mg  0.4 mg Oral QHS Tacey Ruiz, MD   0.4 mg at 02/17/21 2133   zolpidem (AMBIEN) tablet 5 mg  5 mg Oral QHS PRN Tacey Ruiz, MD         Discharge Medications: Please see discharge summary for a list of discharge medications.  Relevant Imaging Results:  Relevant Lab Results:   Additional Information  ([id number])  Chastidy Ranker, Juliann Pulse, RN

## 2021-02-18 NOTE — TOC Transition Note (Addendum)
Transition of Care [facility]) - CM/SW Discharge Note   Patient Details  Name: SCHYLAR FRIDAY MRN: [id number] Date of Birth: June 17, 1928  Transition of Care [facility]) CM/SW Contact:  Dessa Phi, RN Phone Number: 02/18/2021, 12:04 PM   Clinical Narrative:  d/c back to [facility]-ALF rep [place] aware, & can accept back. D/c with f/c. Last covid 7/25 neg-they will accept w/covid results on 7/25. Awaiting d/c summary,then rm#,then PTAR for transport. 12:45p-No d/c today.MD notified of new covid order needed.continue to monitor.    Final next level of care: [place] Barriers to Discharge: No Barriers Identified   Patient Goals and CMS Choice Patient states their goals for this hospitalization and ongoing recovery are:: Return back to ALF CMS Medicare.gov Compare Post Acute Care list provided to:: Patient    Discharge Placement                       Discharge Plan and Services   Discharge Planning Services: CM Consult Post Acute Care Choice: Home Health                    HH Arranged: PT, OT [place] Agency:  ([place] has own HHPT) Date [place]: 02/18/21 Time [place]: 1203 Representative spoke with at [place]: [place] @ [facility]  Social Determinants of Health ([place]) Interventions     Readmission Risk Interventions No flowsheet data found.

## 2021-02-18 NOTE — Plan of Care (Signed)
  Problem: Education: Goal: Knowledge of General Education information will improve Description: Including pain rating scale, medication(s)/side effects and non-pharmacologic comfort measures Outcome: Progressing   Problem: Activity: Goal: Risk for activity intolerance will decrease Outcome: Progressing   Problem: Nutrition: Goal: Adequate nutrition will be maintained Outcome: Progressing   

## 2021-02-18 NOTE — Progress Notes (Signed)
Physical Therapy Treatment Patient Details Name: Jesse [name] MRN: [id number] DOB: 03/09/1928 Today's Date: 02/18/2021    History of Present Illness Jesse [name] is a 87 y.o. male who presents with blood in urine. ED Course: Urinalysis was positive for blood. Dr. Claudia Desanctis placed a 24 French coud tip three-way Foley catheter and irrigated with 1 L saline. Of note, pt was recently admitted 01/22/2021 - 01/26/2021 for abdominal pain with gross hematuria with clots and finding of renal stone; he was treated with Foley catheter (removed prior to discharge), bladder irrigation, urology consult, and cystoscopy with clot evacuation and fulguration on 01/23/2021. PMH: HTN, hyperlipidemia, RBBB, CAD s/p CABG, AAA s/p repair and s/p bilateral femoral aneurysm repair in '95, thoracic aneurysm, GI bleed, prostate cancer s/p XRT, bladder tumor s/p resection, distant repair of bowel obstruction, hernia repair with mesh and recent admission for SBO.    PT Comments    Pt pleasant and eager to mobilize.  Pt ambulated 280 feet with RW in hallway and then repositioned in recliner for lunch end of session.    Follow Up Recommendations  Home health PT;Supervision for mobility/OOB     Equipment Recommendations  None recommended by PT    Recommendations for Other Services       Precautions / Restrictions Precautions Precautions: Fall    Mobility  Bed Mobility Overal bed mobility: Needs Assistance Bed Mobility: Supine to Sit     Supine to sit: Min guard;HOB elevated          Transfers Overall transfer level: Needs assistance Equipment used: Rolling walker (2 wheeled) Transfers: Sit to/from Stand Sit to Stand: Min guard;From elevated surface         General transfer comment: min/guard for safety  Ambulation/Gait Ambulation/Gait assistance: Min guard Gait Distance (Feet): 280 Feet Assistive device: Rolling walker (2 wheeled) Gait Pattern/deviations: Decreased stride length;Step-through pattern      General Gait Details: verbal cues for RW Positioning, pt denies symptoms, distance per pt preference   Stairs             Wheelchair Mobility    Modified Rankin (Stroke Patients Only)       Balance                                            Cognition Arousal/Alertness: Awake/alert Behavior During Therapy: WFL for tasks assessed/performed Overall Cognitive Status: Within Functional Limits for tasks assessed                                        Exercises      General Comments        Pertinent Vitals/Pain Pain Assessment: Faces Faces Pain Scale: Hurts little more Pain Location: bladder Pain Descriptors / Indicators: Sore;Grimacing Pain Intervention(s): Repositioned;Monitored during session    Home Living                      Prior Function            PT Goals (current goals can now be found in the care plan section) Progress towards PT goals: Progressing toward goals    Frequency    Min 3X/week      PT Plan Current plan remains appropriate    Co-evaluation  AM-PAC PT "6 Clicks" Mobility   Outcome Measure  Help needed turning from your back to your side while in a flat bed without using bedrails?: A Little Help needed moving from lying on your back to sitting on the side of a flat bed without using bedrails?: A Little Help needed moving to and from a bed to a chair (including a wheelchair)?: A Little Help needed standing up from a chair using your arms (e.g., wheelchair or bedside chair)?: A Little Help needed to walk in hospital room?: A Little Help needed climbing 3-5 steps with a railing? : A Lot 6 Click Score: 17    End of Session Equipment Utilized During Treatment: Gait belt Activity Tolerance: Patient tolerated treatment well Patient left: in chair;with call bell/phone within reach;with chair alarm set   PT Visit Diagnosis: Other abnormalities of gait and mobility  (R26.89)     Time: [id number] PT Time Calculation (min) (ACUTE ONLY): 18 min  Charges:  $Gait Training: 8-22 mins                    Arlyce Dice, DPT Acute Rehabilitation Services Pager: [phone] Office: [phone]    York Ram E 02/18/2021, 2:37 PM

## 2021-02-18 NOTE — Progress Notes (Signed)
  Subjective: Patient continues to feel well.  Urine was crystal clear yesterday in the morning but suddenly became bloody in the afternoon but did not require CBI.  Urine this morning looks dark almost cola colored with a few small clots in the tubing.  CBI was initiated by the nursing staff and initially got a large amount of clot with manual irrigation is now on CBI with some light pink-colored urine without clots.  Hemoglobin stable at 10  Objective: Vital signs in last 24 hours: Temp:  [97.7 F (36.5 C)-98.1 F (36.7 C)] 97.7 F (36.5 C) (07/28 1406) Pulse Rate:  [59-64] 64 (07/28 1406) Resp:  [16-20] 16 (07/28 1406) BP: (99-113)/(64-72) 99/64 (07/28 1406) SpO2:  [96 %-99 %] 96 % (07/28 1406) Weight:  [82.4 kg] 82.4 kg (07/28 0500)  Intake/Output from previous day: 07/27 0701 - 07/28 0700 In: 270 [P.O.:240] Out: 320 [Urine:320] Intake/Output this shift: Total I/O In: 290 [P.O.:290] Out: 3900 [Urine:3900]  Physical Exam:  General: Alert and oriented Lab Results: Recent Labs    02/16/21 0358 02/17/21 0455 02/18/21 0506  HGB 10.2* 10.2* 9.7*  HCT 33.1* 33.0* 31.3*   BMET Recent Labs    02/16/21 0358 02/17/21 0455  NA 135 135  K 3.8 4.0  CL 104 103  CO2 23 24  GLUCOSE 107* 119*  BUN 21 22  CREATININE 0.73 0.87  CALCIUM 9.3 9.2     Studies/Results: No results found.  Assessment/Plan: Recurrent hematuria, again likely prostatic in origin versus hemorrhagic cystitis Plan/recommendation: Continue CBI today and tonight reassess in a.m.  If continues to have significant clots will need cystoscopy and repeat fulguration.    LOS: 0 days   Remi Haggard 02/18/2021, 4:01 PM

## 2021-02-18 NOTE — Progress Notes (Signed)
PROGRESS NOTE    Jesse [name]  [id number] DOB: 10/26/1927 DOA: 02/15/2021 PCP: [name], MD    Brief Narrative:  87 year old M with PMH of CAD/remote CABG, systolic CHF, prostate cancer s/p radiation tx in 2010, HTN, HLD, anemia, recent hospitalization from 7/1-7/5 for abdominal pain with gross hematuria and a renal stone when he was treated with cystoscopy, clot evacuation, fulguration and empiric antibiotics, returning with suprapubic pain and gross hematuria that has gotten worse over the last 2 weeks.  Hemodynamically stable.  H&H at baseline.  Urology consulted and placed 24 French coud tip three-way Foley catheter and irrigated with 1 L saline.  Urine seems to be clearing.  Urology recommending hyperbaric oxygen therapy for possible radiation cystitis and monitoring urine prior to discharge.  Not on CBI.  Assessment & Plan:   Principal Problem:   Gross hematuria Active Problems:   Mixed hyperlipidemia   Essential hypertension   Anemia associated with acute blood loss   Coronary artery disease involving native coronary artery of native heart without angina pectoris   Chronic systolic heart failure ([place])   History of prostate cancer   GERD without esophagitis   Radiation cystitis  Fever secondary to possible UTI:  -Recently noted to have fever to 102.3 -Remains hemodynamically stable -Urine culture thus far pos for gm neg organisms -Continued on rocephin -Currently afebrile   Acute blood loss anemia -H&H currently stable. -Iron level 15, given course of IV iron -Suspect secondary to hemturia below   History of CAD/CABG-no anginal symptoms. Chronic systolic CHF: TTE in [id number] with LVEF of 40 to 45%.  No cardiopulmonary symptoms. Euvolemic on exam Essential hypertension: Normotensive. -Continue home Coreg and a statin. -Currently holding aspirin and Aldactone. -Cont to monitor volume status    History of prostate cancer s/p radiation treatment in 2013 with  hematuria -Continue Flomax -Urology following with recommendations for close follow up and to keep cath in until 8/1 -Recently developed hematuria with clots, now undergoing CBI per Urology   GERD without esophagitis -Continue pantoprazole.   Insomnia: -Continue home Ambien and melatonin.     Chronic diarrhea:  -No further diarrhea documented   Debility/ambulatory dysfunction-uses walker at baseline. -PT/OT following Body mass index is 23.05 kg/m.  DVT prophylaxis: SCD's Code Status: DNR Family Communication: Pt in room, family not at bedside  Status is: Observation  The patient remains OBS appropriate and will d/c before 2 midnights.  Dispo: The patient is from: ALF              Anticipated d/c is to: ALF              Patient currently is not medically stable to d/c.   Difficult to place patient No  Consultants:  Urology  Procedures:    Antimicrobials: Anti-infectives (From admission, onward)    Start     Dose/Rate Route Frequency Ordered Stop   02/16/21 1700  cefTRIAXone (ROCEPHIN) 1 g in sodium chloride 0.9 % 100 mL IVPB        1 g 200 mL/hr over 30 Minutes Intravenous Every 24 hours 02/16/21 1630 02/21/21 1659   02/16/21 0315  cephALEXin (KEFLEX) capsule 500 mg  Status:  Discontinued        500 mg Oral 3 times daily 02/16/21 0310 02/16/21 1630       Subjective: Without complaints today  Objective: Vitals:   02/18/21 0437 02/18/21 0500 02/18/21 1406 02/18/21 1606  BP: 105/72  99/64 108/68  Pulse: (!) 59  64 62  Resp: 20  16   Temp: 98.1 F (36.7 C)  97.7 F (36.5 C)   TempSrc: Oral  Oral   SpO2: 96%  96%   Weight:  82.4 kg    Height:        Intake/Output Summary (Last 24 hours) at 02/18/2021 1608 Last data filed at 02/18/2021 1500 Gross per 24 hour  Intake 320 ml  Output 4220 ml  Net -3900 ml    Filed Weights   02/16/21 0659 02/17/21 0442 02/18/21 0500  Weight: 77.1 kg 78.6 kg 82.4 kg    Examination: General exam: Conversant, in no  acute distress Respiratory system: normal chest rise, clear, no audible wheezing Cardiovascular system: regular rhythm, s1-s2 Gastrointestinal system: Nondistended, nontender, pos BS Central nervous system: No seizures, no tremors Extremities: No cyanosis, no joint deformities Skin: No rashes, no pallor Psychiatry: Affect normal // no auditory hallucinations   Data Reviewed: I have personally reviewed following labs and imaging studies  CBC: Recent Labs  Lab 02/15/21 1350 02/16/21 0358 02/17/21 0455 02/18/21 0506  WBC 7.4 10.8* 6.5 5.5  NEUTROABS 5.7  --   --   --   HGB 10.6* 10.2* 10.2* 9.7*  HCT 32.8* 33.1* 33.0* 31.3*  MCV 91.9 94.8 96.5 95.1  PLT 284 249 201 [id number]    Basic Metabolic Panel: Recent Labs  Lab 02/15/21 1350 02/16/21 0358 02/17/21 0455  NA 139 135 135  K 3.8 3.8 4.0  CL 109 104 103  CO2 '22 23 24  '$ GLUCOSE 142* 107* 119*  BUN 26* 21 22  CREATININE 0.94 0.73 0.87  CALCIUM 9.0 9.3 9.2  MG  --   --  2.0  PHOS  --   --  2.3*    GFR: Estimated Creatinine Clearance: 61.9 mL/min (by C-G formula based on SCr of 0.87 mg/dL). Liver Function Tests: Recent Labs  Lab 02/17/21 0455  ALBUMIN 3.4*    No results for input(s): LIPASE, AMYLASE in the last 168 hours. No results for input(s): AMMONIA in the last 168 hours. Coagulation Profile: Recent Labs  Lab 02/16/21 0358  INR 1.2    Cardiac Enzymes: No results for input(s): CKTOTAL, CKMB, CKMBINDEX, TROPONINI in the last 168 hours. BNP (last 3 results) No results for input(s): PROBNP in the last 8760 hours. HbA1C: No results for input(s): HGBA1C in the last 72 hours. CBG: No results for input(s): GLUCAP in the last 168 hours. Lipid Profile: No results for input(s): CHOL, HDL, LDLCALC, TRIG, CHOLHDL, LDLDIRECT in the last 72 hours. Thyroid Function Tests: No results for input(s): TSH, T4TOTAL, FREET4, T3FREE, THYROIDAB in the last 72 hours. Anemia Panel: Recent Labs    02/17/21 0455  VITAMINB12  277  FOLATE 8.7  FERRITIN 236  TIBC 304  IRON 15*  RETICCTPCT 2.0    Sepsis Labs: No results for input(s): PROCALCITON, LATICACIDVEN in the last 168 hours.  Recent Results (from the past 240 hour(s))  SARS CORONAVIRUS 2 (TAT 6-24 HRS) Nasopharyngeal Nasopharyngeal Swab     Status: None   Collection Time: 02/15/21  6:05 PM   Specimen: Nasopharyngeal Swab  Result Value Ref Range Status   SARS Coronavirus 2 NEGATIVE NEGATIVE Final    Comment: (NOTE) SARS-CoV-2 target nucleic acids are NOT DETECTED.  The SARS-CoV-2 RNA is generally detectable in upper and lower respiratory specimens during the acute phase of infection. Negative results do not preclude SARS-CoV-2 infection, do not rule out co-infections with other pathogens, and should not be used as the  sole basis for treatment or other patient management decisions. Negative results must be combined with clinical observations, patient history, and epidemiological information. The expected result is Negative.  Fact Sheet for Patients: SugarRoll.be  Fact Sheet for Healthcare Providers: https://www.woods-mathews.com/  This test is not yet approved or cleared by the [place] FDA and  has been authorized for detection and/or diagnosis of SARS-CoV-2 by FDA under an Emergency Use Authorization (EUA). This EUA will remain  in effect (meaning this test can be used) for the duration of the COVID-19 declaration under Se ction 564(b)(1) of the Act, 21 U.S.C. section 360bbb-3(b)(1), unless the authorization is terminated or revoked sooner.  Performed at [facility], [place] [address] [place], [place] [address]   Urine Culture     Status: Abnormal (Preliminary result)   Collection Time: 02/16/21  2:58 PM   Specimen: Urine, Catheterized  Result Value Ref Range Status   Specimen Description   Final    URINE, CATHETERIZED Performed at [facility] [address] [place], [place] [address]    Special Requests   Final    NONE Performed at [facility], [place] [address], [place], [place] [address]    Culture (A)  Final    >=100,000 COLONIES/mL GRAM NEGATIVE RODS SUSCEPTIBILITIES TO FOLLOW CULTURE REINCUBATED FOR BETTER GROWTH Performed at [facility], [place] [address] [place], [place] [address]    Report Status PENDING  Incomplete  Culture, blood (routine x 2)     Status: None (Preliminary result)   Collection Time: 02/16/21  5:12 PM   Specimen: BLOOD  Result Value Ref Range Status   Specimen Description   Final    BLOOD LEFT HAND Performed at [facility] [address] [place], [place] [address]    Special Requests   Final    BOTTLES DRAWN AEROBIC AND ANAEROBIC Blood Culture adequate volume Performed at [facility] [address] [place], [place] [address]    Culture   Final    NO GROWTH 2 DAYS Performed at [facility] [address] [place], [place] [address]    Report Status PENDING  Incomplete  Culture, blood (routine x 2)     Status: None (Preliminary result)   Collection Time: 02/16/21  5:12 PM   Specimen: BLOOD  Result Value Ref Range Status   Specimen Description   Final    BLOOD RIGHT ANTECUBITAL Performed at [facility] [address] [place], [place] [address]    Special Requests   Final    BOTTLES DRAWN AEROBIC AND ANAEROBIC Blood Culture adequate volume Performed at [facility] [address] [place], [place] [address]    Culture   Final    NO GROWTH 2 DAYS Performed at [facility] [address] [place],  [address]    Report Status PENDING  Incomplete      Radiology Studies: No results found.  Scheduled Meds:  carvedilol  3.125 mg Oral BID WC   Chlorhexidine Gluconate Cloth  6 each Topical Daily   docusate sodium  100 mg Oral BID   fenofibrate  160 mg Oral Daily    finasteride  5 mg Oral Daily   lactobacillus   Oral BID   melatonin  5 mg Oral QHS   pantoprazole  40 mg Oral Daily   polyethylene glycol  17 g Oral Daily   polyvinyl alcohol  2 drop  Both Eyes QHS   sertraline  50 mg Oral Daily   simvastatin  40 mg Oral q1800   sodium chloride  2 spray Each Nare BID   spironolactone  12.5 mg Oral Daily   tamsulosin  0.4 mg Oral QHS   Continuous Infusions:  cefTRIAXone (ROCEPHIN)  IV 1 g (02/17/21 1737)   ferric gluconate (FERRLECIT) IVPB 250 mg (02/17/21 2322)     LOS: 0 days   Marylu Lund, MD [facility] Pager On Amion  If 7PM-7AM, please contact night-coverage 02/18/2021, 4:08 PM

## 2021-02-19 DIAGNOSIS — G4700 Insomnia, unspecified: Secondary | ICD-10-CM | POA: Diagnosis present

## 2021-02-19 DIAGNOSIS — Z8616 Personal history of COVID-19: Secondary | ICD-10-CM | POA: Diagnosis not present

## 2021-02-19 DIAGNOSIS — N419 Inflammatory disease of prostate, unspecified: Secondary | ICD-10-CM | POA: Diagnosis present

## 2021-02-19 DIAGNOSIS — H9190 Unspecified hearing loss, unspecified ear: Secondary | ICD-10-CM | POA: Diagnosis present

## 2021-02-19 DIAGNOSIS — N3289 Other specified disorders of bladder: Secondary | ICD-10-CM | POA: Diagnosis present

## 2021-02-19 DIAGNOSIS — I2510 Atherosclerotic heart disease of native coronary artery without angina pectoris: Secondary | ICD-10-CM | POA: Diagnosis present

## 2021-02-19 DIAGNOSIS — Z66 Do not resuscitate: Secondary | ICD-10-CM | POA: Diagnosis present

## 2021-02-19 DIAGNOSIS — I723 Aneurysm of iliac artery: Secondary | ICD-10-CM | POA: Diagnosis present

## 2021-02-19 DIAGNOSIS — K219 Gastro-esophageal reflux disease without esophagitis: Secondary | ICD-10-CM | POA: Diagnosis present

## 2021-02-19 DIAGNOSIS — I739 Peripheral vascular disease, unspecified: Secondary | ICD-10-CM | POA: Diagnosis present

## 2021-02-19 DIAGNOSIS — B9689 Other specified bacterial agents as the cause of diseases classified elsewhere: Secondary | ICD-10-CM | POA: Diagnosis present

## 2021-02-19 DIAGNOSIS — D62 Acute posthemorrhagic anemia: Secondary | ICD-10-CM

## 2021-02-19 DIAGNOSIS — Y842 Radiological procedure and radiotherapy as the cause of abnormal reaction of the patient, or of later complication, without mention of misadventure at the time of the procedure: Secondary | ICD-10-CM | POA: Diagnosis present

## 2021-02-19 DIAGNOSIS — N3041 Irradiation cystitis with hematuria: Secondary | ICD-10-CM | POA: Diagnosis present

## 2021-02-19 DIAGNOSIS — Z1619 Resistance to other specified beta lactam antibiotics: Secondary | ICD-10-CM | POA: Diagnosis present

## 2021-02-19 DIAGNOSIS — M1990 Unspecified osteoarthritis, unspecified site: Secondary | ICD-10-CM | POA: Diagnosis present

## 2021-02-19 DIAGNOSIS — R319 Hematuria, unspecified: Secondary | ICD-10-CM | POA: Diagnosis present

## 2021-02-19 DIAGNOSIS — Z20822 Contact with and (suspected) exposure to covid-19: Secondary | ICD-10-CM | POA: Diagnosis present

## 2021-02-19 DIAGNOSIS — J302 Other seasonal allergic rhinitis: Secondary | ICD-10-CM | POA: Diagnosis present

## 2021-02-19 DIAGNOSIS — K529 Noninfective gastroenteritis and colitis, unspecified: Secondary | ICD-10-CM | POA: Diagnosis present

## 2021-02-19 DIAGNOSIS — R310 Gross hematuria: Secondary | ICD-10-CM | POA: Diagnosis present

## 2021-02-19 DIAGNOSIS — R5381 Other malaise: Secondary | ICD-10-CM | POA: Diagnosis present

## 2021-02-19 DIAGNOSIS — Z1611 Resistance to penicillins: Secondary | ICD-10-CM | POA: Diagnosis present

## 2021-02-19 DIAGNOSIS — I110 Hypertensive heart disease with heart failure: Secondary | ICD-10-CM | POA: Diagnosis present

## 2021-02-19 DIAGNOSIS — I5022 Chronic systolic (congestive) heart failure: Secondary | ICD-10-CM | POA: Diagnosis present

## 2021-02-19 DIAGNOSIS — I10 Essential (primary) hypertension: Secondary | ICD-10-CM | POA: Diagnosis not present

## 2021-02-19 DIAGNOSIS — E782 Mixed hyperlipidemia: Secondary | ICD-10-CM | POA: Diagnosis present

## 2021-02-19 DIAGNOSIS — K8020 Calculus of gallbladder without cholecystitis without obstruction: Secondary | ICD-10-CM | POA: Diagnosis present

## 2021-02-19 LAB — COMPREHENSIVE METABOLIC PANEL WITH GFR
ALT: 27 U/L (ref 0–44)
AST: 26 U/L (ref 15–41)
Albumin: 3 g/dL — ABNORMAL LOW (ref 3.5–5.0)
Alkaline Phosphatase: 56 U/L (ref 38–126)
Anion gap: 4 — ABNORMAL LOW (ref 5–15)
BUN: 19 mg/dL (ref 8–23)
CO2: 26 mmol/L (ref 22–32)
Calcium: 8.6 mg/dL — ABNORMAL LOW (ref 8.9–10.3)
Chloride: 103 mmol/L (ref 98–111)
Creatinine, Ser: 0.75 mg/dL (ref 0.61–1.24)
GFR, Estimated: >60 mL/min
Glucose, Bld: 98 mg/dL (ref 70–99)
Potassium: 4.1 mmol/L (ref 3.5–5.1)
Sodium: 133 mmol/L — ABNORMAL LOW (ref 135–145)
Total Bilirubin: 0.5 mg/dL (ref 0.3–1.2)
Total Protein: 5.3 g/dL — ABNORMAL LOW (ref 6.5–8.1)

## 2021-02-19 LAB — CBC
HCT: 31.6 % — ABNORMAL LOW (ref 39.0–52.0)
Hemoglobin: 9.9 g/dL — ABNORMAL LOW (ref 13.0–17.0)
MCH: 29.7 pg (ref 26.0–34.0)
MCHC: 31.3 g/dL (ref 30.0–36.0)
MCV: 94.9 fL (ref 80.0–100.0)
Platelets: 224 K/uL (ref 150–400)
RBC: 3.33 MIL/uL — ABNORMAL LOW (ref 4.22–5.81)
RDW: 14.1 % (ref 11.5–15.5)
WBC: 5.9 K/uL (ref 4.0–10.5)
nRBC: 0 % (ref 0.0–0.2)

## 2021-02-19 LAB — URINE CULTURE: Culture: >=100000 — AB

## 2021-02-19 MED ORDER — SULFAMETHOXAZOLE-TRIMETHOPRIM 800-160 MG PO TABS
1.0000 | ORAL_TABLET | Freq: Two times a day (BID) | ORAL | Status: AC
  Administered 2021-02-19 – 2021-02-21 (×5): 1 via ORAL
  Filled 2021-02-19 (×6): qty 1

## 2021-02-19 NOTE — Progress Notes (Signed)
PROGRESS NOTE    Jesse [name]  [id number] DOB: 08-Jul-1928 DOA: 02/15/2021 PCP: [name], MD    Brief Narrative:  87 year old M with PMH of CAD/remote CABG, systolic CHF, prostate cancer s/p radiation tx in 2010, HTN, HLD, anemia, recent hospitalization from 7/1-7/5 for abdominal pain with gross hematuria and a renal stone when he was treated with cystoscopy, clot evacuation, fulguration and empiric antibiotics, returning with suprapubic pain and gross hematuria that has gotten worse over the last 2 weeks.  Hemodynamically stable.  H&H at baseline.  Urology consulted and placed 24 French coud tip three-way Foley catheter and irrigated with 1 L saline.  Urine seems to be clearing.  Urology recommending hyperbaric oxygen therapy for possible radiation cystitis and monitoring urine prior to discharge.  Not on CBI.  Assessment & Plan:   Principal Problem:   Gross hematuria Active Problems:   Mixed hyperlipidemia   Essential hypertension   Anemia associated with acute blood loss   Coronary artery disease involving native coronary artery of native heart without angina pectoris   Chronic systolic heart failure ([place])   History of prostate cancer   GERD without esophagitis   Radiation cystitis   Hematuria  Fever secondary to possible UTI:  -Recently noted to have fever to 102.3 -Remains hemodynamically stable -Urine culture thus far pos for gm neg organisms -Continued on rocephin -Presently afebrile   Acute blood loss anemia -H&H currently stable. -Iron level 15, given course of IV iron -Suspect secondary to hematuria below   History of CAD/CABG-no anginal symptoms. Chronic systolic CHF: TTE in [id number] with LVEF of 40 to 45%.  No cardiopulmonary symptoms. Euvolemic on exam Essential hypertension: Normotensive. -Continue home Coreg and a statin. -Currently holding aspirin and Aldactone. -Cont to monitor volume status    History of prostate cancer s/p radiation treatment in 2013  with hematuria -Continue Flomax -Urology following with recommendations for close follow up and to keep cath in until 8/1 -Still with bloody looking urine this AM. Per Urology, hopeful d/c CBI tomorrow and consider voiding trial if urine remains clear   GERD without esophagitis -Continue pantoprazole.   Insomnia: -Continue home Ambien and melatonin.     Chronic diarrhea:  -No further diarrhea documented   Debility/ambulatory dysfunction-uses walker at baseline. -PT/OT following Body mass index is 23.05 kg/m.  DVT prophylaxis: SCD's Code Status: DNR Family Communication: Pt in room, family not at bedside  Status is: Observation  The patient remains OBS appropriate and will d/c before 2 midnights.  Dispo: The patient is from: ALF              Anticipated d/c is to: ALF              Patient currently is not medically stable to d/c.   Difficult to place patient No  Consultants:  Urology  Procedures:    Antimicrobials: Anti-infectives (From admission, onward)    Start     Dose/Rate Route Frequency Ordered Stop   02/19/21 1445  sulfamethoxazole-trimethoprim (BACTRIM DS) 800-160 MG per tablet 1 tablet        1 tablet Oral Every 12 hours 02/19/21 1355 02/22/21 0959   02/16/21 1700  cefTRIAXone (ROCEPHIN) 1 g in sodium chloride 0.9 % 100 mL IVPB  Status:  Discontinued        1 g 200 mL/hr over 30 Minutes Intravenous Every 24 hours 02/16/21 1630 02/19/21 1355   02/16/21 0315  cephALEXin (KEFLEX) capsule 500 mg  Status:  Discontinued  500 mg Oral 3 times daily 02/16/21 0310 02/16/21 1630       Subjective: No complaints today  Objective: Vitals:   02/18/21 2023 02/19/21 0341 02/19/21 0500 02/19/21 1418  BP: 107/67 120/76  112/66  Pulse: 60 (!) 57  66  Resp: '20 20  20  '$ Temp: 98 F (36.7 C) 97.8 F (36.6 C)  97.9 F (36.6 C)  TempSrc: Oral Oral  Oral  SpO2: 98% 95%  100%  Weight:   78.5 kg   Height:        Intake/Output Summary (Last 24 hours) at  02/19/2021 1648 Last data filed at 02/19/2021 1008 Gross per 24 hour  Intake 7828 ml  Output 9975 ml  Net -2147 ml    Filed Weights   02/17/21 0442 02/18/21 0500 02/19/21 0500  Weight: 78.6 kg 82.4 kg 78.5 kg    Examination: General exam: Awake, laying in bed, in nad Respiratory system: Normal respiratory effort, no wheezing Cardiovascular system: regular rate, s1, s2 Gastrointestinal system: Soft, nondistended, positive BS Central nervous system: CN2-12 grossly intact, strength intact Extremities: Perfused, no clubbing Skin: Normal skin turgor, no notable skin lesions seen Psychiatry: Mood normal // no visual hallucinations   Data Reviewed: I have personally reviewed following labs and imaging studies  CBC: Recent Labs  Lab 02/15/21 1350 02/16/21 0358 02/17/21 0455 02/18/21 0506 02/19/21 0513  WBC 7.4 10.8* 6.5 5.5 5.9  NEUTROABS 5.7  --   --   --   --   HGB 10.6* 10.2* 10.2* 9.7* 9.9*  HCT 32.8* 33.1* 33.0* 31.3* 31.6*  MCV 91.9 94.8 96.5 95.1 94.9  PLT 284 249 201 185 [id number]    Basic Metabolic Panel: Recent Labs  Lab 02/15/21 1350 02/16/21 0358 02/17/21 0455 02/19/21 0513  NA 139 135 135 133*  K 3.8 3.8 4.0 4.1  CL 109 104 103 103  CO2 '22 23 24 26  '$ GLUCOSE 142* 107* 119* 98  BUN 26* '21 22 19  '$ CREATININE 0.94 0.73 0.87 0.75  CALCIUM 9.0 9.3 9.2 8.6*  MG  --   --  2.0  --   PHOS  --   --  2.3*  --     GFR: Estimated Creatinine Clearance: 67.4 mL/min (by C-G formula based on SCr of 0.75 mg/dL). Liver Function Tests: Recent Labs  Lab 02/17/21 0455 02/19/21 0513  AST  --  26  ALT  --  27  ALKPHOS  --  56  BILITOT  --  0.5  PROT  --  5.3*  ALBUMIN 3.4* 3.0*    No results for input(s): LIPASE, AMYLASE in the last 168 hours. No results for input(s): AMMONIA in the last 168 hours. Coagulation Profile: Recent Labs  Lab 02/16/21 0358  INR 1.2    Cardiac Enzymes: No results for input(s): CKTOTAL, CKMB, CKMBINDEX, TROPONINI in the last 168  hours. BNP (last 3 results) No results for input(s): PROBNP in the last 8760 hours. HbA1C: No results for input(s): HGBA1C in the last 72 hours. CBG: No results for input(s): GLUCAP in the last 168 hours. Lipid Profile: No results for input(s): CHOL, HDL, LDLCALC, TRIG, CHOLHDL, LDLDIRECT in the last 72 hours. Thyroid Function Tests: No results for input(s): TSH, T4TOTAL, FREET4, T3FREE, THYROIDAB in the last 72 hours. Anemia Panel: Recent Labs    02/17/21 0455  VITAMINB12 277  FOLATE 8.7  FERRITIN 236  TIBC 304  IRON 15*  RETICCTPCT 2.0    Sepsis Labs: No results for input(s): PROCALCITON, LATICACIDVEN  in the last 168 hours.  Recent Results (from the past 240 hour(s))  SARS CORONAVIRUS 2 (TAT 6-24 HRS) Nasopharyngeal Nasopharyngeal Swab     Status: None   Collection Time: 02/15/21  6:05 PM   Specimen: Nasopharyngeal Swab  Result Value Ref Range Status   SARS Coronavirus 2 NEGATIVE NEGATIVE Final    Comment: (NOTE) SARS-CoV-2 target nucleic acids are NOT DETECTED.  The SARS-CoV-2 RNA is generally detectable in upper and lower respiratory specimens during the acute phase of infection. Negative results do not preclude SARS-CoV-2 infection, do not rule out co-infections with other pathogens, and should not be used as the sole basis for treatment or other patient management decisions. Negative results must be combined with clinical observations, patient history, and epidemiological information. The expected result is Negative.  Fact Sheet for Patients: SugarRoll.be  Fact Sheet for Healthcare Providers: https://www.woods-mathews.com/  This test is not yet approved or cleared by the [place] FDA and  has been authorized for detection and/or diagnosis of SARS-CoV-2 by FDA under an Emergency Use Authorization (EUA). This EUA will remain  in effect (meaning this test can be used) for the duration of the COVID-19 declaration  under Se ction 564(b)(1) of the Act, 21 U.S.C. section 360bbb-3(b)(1), unless the authorization is terminated or revoked sooner.  Performed at [facility], [place] [address] [place], [place] [address]   Urine Culture     Status: Abnormal   Collection Time: 02/16/21  2:58 PM   Specimen: Urine, Catheterized  Result Value Ref Range Status   Specimen Description   Final    URINE, CATHETERIZED Performed at [facility] [address] [place], [place] [address]    Special Requests   Final    NONE Performed at [facility], [place] [address], [place], [place] [address]    Culture >=100,000 COLONIES/mL ENTEROBACTER CLOACAE (A)  Final   Report Status 02/19/2021 FINAL  Final   Organism ID, Bacteria ENTEROBACTER CLOACAE (A)  Final      Susceptibility   Enterobacter cloacae - MIC*    CEFAZOLIN >=64 RESISTANT Resistant     CEFEPIME <=0.12 SENSITIVE Sensitive     CIPROFLOXACIN <=0.25 SENSITIVE Sensitive     GENTAMICIN <=1 SENSITIVE Sensitive     IMIPENEM 0.5 SENSITIVE Sensitive     NITROFURANTOIN 64 INTERMEDIATE Intermediate     TRIMETH/SULFA <=20 SENSITIVE Sensitive     PIP/TAZO >=128 RESISTANT Resistant     * >=100,000 COLONIES/mL ENTEROBACTER CLOACAE  Culture, blood (routine x 2)     Status: None (Preliminary result)   Collection Time: 02/16/21  5:12 PM   Specimen: BLOOD  Result Value Ref Range Status   Specimen Description   Final    BLOOD LEFT HAND Performed at [facility] [address] [place], [place] [address]    Special Requests   Final    BOTTLES DRAWN AEROBIC AND ANAEROBIC Blood Culture adequate volume Performed at [facility] [address] [place], [place] [address]    Culture   Final    NO GROWTH 2 DAYS Performed at [facility] [address], [place], [place] [address]    Report Status PENDING  Incomplete  Culture, blood (routine x 2)     Status: None (Preliminary result)    Collection Time: 02/16/21  5:12 PM   Specimen: BLOOD  Result Value Ref Range Status   Specimen Description   Final    BLOOD RIGHT ANTECUBITAL  Performed at [facility], [place] [address] [place], [place] [address]    Special Requests   Final    BOTTLES DRAWN AEROBIC AND ANAEROBIC Blood Culture adequate volume Performed at [facility] [address], [place], [place] [address]    Culture   Final    NO GROWTH 2 DAYS Performed at [facility] [address], [place], [place] [address]    Report Status PENDING  Incomplete      Radiology Studies: No results found.  Scheduled Meds:  carvedilol  3.125 mg Oral BID WC   Chlorhexidine Gluconate Cloth  6 each Topical Daily   docusate sodium  100 mg Oral BID   fenofibrate  160 mg Oral Daily   finasteride  5 mg Oral Daily   lactobacillus   Oral BID   melatonin  5 mg Oral QHS   pantoprazole  40 mg Oral Daily   polyethylene glycol  17 g Oral Daily   polyvinyl alcohol  2 drop Both Eyes QHS   sertraline  50 mg Oral Daily   simvastatin  40 mg Oral q1800   sodium chloride  2 spray Each Nare BID   spironolactone  12.5 mg Oral Daily   sulfamethoxazole-trimethoprim  1 tablet Oral Q12H   tamsulosin  0.4 mg Oral QHS   Continuous Infusions:     LOS: 0 days   Marylu Lund, MD [facility] Pager On Amion  If 7PM-7AM, please contact night-coverage 02/19/2021, 4:48 PM

## 2021-02-19 NOTE — Progress Notes (Signed)
Since clamping off CBI by urology this AM, urine has remained red with small clots

## 2021-02-19 NOTE — Progress Notes (Signed)
  Subjective: Patient feeling well this AM.  Urine clear on moderate rate CBI.  I clamped the CBI off and urine has no pink color at all.  No clots.  Objective: Vital signs in last 24 hours: Temp:  [97.7 F (36.5 C)-98 F (36.7 C)] 97.8 F (36.6 C) (07/29 0341) Pulse Rate:  [57-64] 57 (07/29 0341) Resp:  [16-20] 20 (07/29 0341) BP: (99-120)/(64-76) 120/76 (07/29 0341) SpO2:  [95 %-98 %] 95 % (07/29 0341) Weight:  [78.5 kg] 78.5 kg (07/29 0500)  Intake/Output from previous day: 07/28 0701 - 07/29 0700 In: 8118 [P.O.:1018; IV Piggyback:300] Out: [id number] [Urine:12875] Intake/Output this shift: Total I/O In: -  Out: 2000 [Urine:2000]  Physical Exam:  General: Alert and oriented  Lab Results: Recent Labs    02/17/21 0455 02/18/21 0506 02/19/21 0513  HGB 10.2* 9.7* 9.9*  HCT 33.0* 31.3* 31.6*   BMET Recent Labs    02/17/21 0455 02/19/21 0513  NA 135 133*  K 4.0 4.1  CL 103 103  CO2 24 26  GLUCOSE 119* 98  BUN 22 19  CREATININE 0.87 0.75  CALCIUM 9.2 8.6*     Studies/Results: No results found.  Assessment/Plan: Gross hematuria likely secondary to radiation cystitis urine now clear with CBI clamped, Hopeful DC CBI tomorrow consider voiding trial if urine remains clear.  Patient to be scheduled for outpatient hyperbaric oxygen    LOS: 0 days   Remi Haggard 02/19/2021, 1:10 PM

## 2021-02-19 NOTE — NC FL2 (Signed)
[place] LEVEL OF CARE SCREENING TOOL     IDENTIFICATION  Patient Name: Jesse [name] Birthdate: [birth year] Sex: male Admission Date (Current Location): 02/15/2021  [facility] and [place] Number:  Herbalist and Address:  [facility],  [place], [place]      Provider Number: [id number]  Attending Physician Name and Address:  Donne Hazel, MD  Relative Name and Phone Number:  Larrie Kass [phone]  [phone]  Loni Dolly Daughter [phone]  [phone]    Current Level of Care: Hospital Recommended Level of Care:  [facility] @ [facility]) Prior Approval Number:    Date Approved/Denied:   PASRR Number:    Discharge Plan:  [facility] @ [facility])    Current Diagnoses: Patient Active Problem List   Diagnosis Date Noted   GERD without esophagitis 02/15/2021   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   Gross hematuria 01/23/2021   Palliative care by specialist    SBO (small bowel obstruction) ([place]) 12/12/2020   Abducens (sixth) nerve palsy, left 02/20/2020   Binocular vision disorder with diplopia 02/20/2020   History of prostate cancer 08/15/2019   Anxiety 06/21/2019   Anemia due to chronic illness 06/20/2019   Pneumonia due to COVID-19 virus 06/20/2019   Displaced spiral fracture of shaft of right femur, initial encounter for open fracture type I or II ([place]) 06/20/2019   COVID-19 virus infection 06/19/2019   Goals of care, counseling/discussion    Hip fracture ([place]) 04/15/2019   Fall    Coronary artery disease involving native coronary artery of native heart without angina pectoris 11/20/2018   Thoracic aortic aneurysm without rupture ([place]) [id number]   Chronic systolic heart failure ([place]) 11/20/2018   Duodenal ulcer with hemorrhage 05/05/2018   Hemorrhagic shock ([place]) 05/05/2018   AKI (acute kidney injury) ([place]) 05/05/2018   Orthostatic hypotension 05/04/2018   Anemia associated with acute blood loss  05/04/2018   Melena 05/04/2018   GI bleed 05/04/2018   Patella fracture 06/09/2015   Aftercare following surgery of the circulatory system, NEC 01/08/2013   Pain in limb- Left popliteal 12/25/2012   Aneurysm artery, femoral ([place]) 05/22/2012   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   Aneurysm of abdominal vessel (HCC) 02/21/2012   Iliac artery aneurysm, bilateral (HCC) 08/09/2011   Aneurysm of artery of lower extremity ([place]) 08/09/2011   HLD (hyperlipidemia) 01/16/2009   Essential hypertension 01/16/2009   CAD, ARTERY BYPASS GRAFT 01/16/2009   PERIPHERAL VASCULAR DISEASE 01/16/2009   ABDOMINAL AORTIC ANEURYSM REPAIR, HX OF 01/16/2009   Mixed hyperlipidemia 12/28/2008    Orientation RESPIRATION BLADDER Height & Weight     Self, Time, Situation, Place  Normal Indwelling catheter Weight: 78.5 kg Height:  6' (182.9 cm)  BEHAVIORAL SYMPTOMS/MOOD NEUROLOGICAL BOWEL NUTRITION STATUS      Continent Diet (Regular)  AMBULATORY STATUS COMMUNICATION OF NEEDS Skin   Supervision Verbally Normal                       Personal Care Assistance Level of Assistance  Bathing, Feeding, Dressing Bathing Assistance: Limited assistance Feeding assistance: Limited assistance Dressing Assistance: Limited assistance     Functional Limitations Info  Sight, Hearing, Speech Sight Info: Adequate Hearing Info: Adequate Speech Info: Adequate    SPECIAL CARE FACTORS FREQUENCY  OT (By licensed OT), PT (By licensed PT)     PT Frequency: 3x week OT Frequency: 3x week            Contractures Contractures Info: Not  present    Additional Factors Info  Code Status, Allergies, Psychotropic Code Status Info:  (DNR) Allergies Info:  (Quinolones, Adhesive (Tape)) Psychotropic Info:  (Ambien '5mg'$  po hs prn)         Current Medications (02/19/2021):  This is the current hospital active medication list Current Facility-Administered Medications  Medication Dose Route Frequency Provider Last  Rate Last Admin   acetaminophen (TYLENOL) tablet 650 mg  650 mg Oral Q6H PRN Tacey Ruiz, MD   650 mg at 02/16/21 1611   Or   acetaminophen (TYLENOL) suppository 650 mg  650 mg Rectal Q6H PRN Tacey Ruiz, MD       bisacodyl (DULCOLAX) suppository 10 mg  10 mg Rectal Daily PRN Tacey Ruiz, MD       carvedilol (COREG) tablet 3.125 mg  3.125 mg Oral BID WC Tacey Ruiz, MD   3.125 mg at 02/19/21 0958   cefTRIAXone (ROCEPHIN) 1 g in sodium chloride 0.9 % 100 mL IVPB  1 g Intravenous Q24H Wendee Beavers T, MD 200 mL/hr at 02/18/21 1612 1 g at 02/18/21 1612   Chlorhexidine Gluconate Cloth 2 % PADS 6 each  6 each Topical Daily Mercy Riding, MD   6 each at 02/19/21 0959   docusate sodium (COLACE) capsule 100 mg  100 mg Oral BID Tacey Ruiz, MD   100 mg at 02/19/21 [id number]   fenofibrate tablet 160 mg  160 mg Oral Daily Tacey Ruiz, MD   160 mg at 02/19/21 0958   finasteride (PROSCAR) tablet 5 mg  5 mg Oral Daily Tacey Ruiz, MD   5 mg at 02/19/21 0957   HYDROcodone-acetaminophen (NORCO/VICODIN) 5-325 MG per tablet 1-2 tablet  1-2 tablet Oral Q4H PRN Tacey Ruiz, MD   2 tablet at 02/19/21 0455   lactobacillus (FLORANEX/LACTINEX) granules   Oral BID Tacey Ruiz, MD   1 g at 02/19/21 0957   melatonin tablet 5 mg  5 mg Oral QHS Tacey Ruiz, MD   5 mg at 02/18/21 2206   morphine 2 MG/ML injection 2 mg  2 mg Intravenous Q2H PRN Tacey Ruiz, MD       ondansetron (ZOFRAN) tablet 4 mg  4 mg Oral Q6H PRN Tacey Ruiz, MD       Or   ondansetron (ZOFRAN) injection 4 mg  4 mg Intravenous Q6H PRN Tacey Ruiz, MD       pantoprazole (PROTONIX) EC tablet 40 mg  40 mg Oral Daily Tacey Ruiz, MD   40 mg at 02/19/21 0958   polyethylene glycol (MIRALAX / GLYCOLAX) packet 17 g  17 g Oral Daily Tacey Ruiz, MD   17 g at 02/19/21 0957   polyvinyl alcohol (LIQUIFILM TEARS) 1.4 % ophthalmic solution 2 drop  2 drop Both Eyes QHS Tacey Ruiz, MD   2 drop at 02/18/21 2209    senna-docusate (Senokot-S) tablet 1 tablet  1 tablet Oral QHS PRN Tacey Ruiz, MD       sertraline (ZOLOFT) tablet 50 mg  50 mg Oral Daily Tacey Ruiz, MD   50 mg at 02/19/21 0958   simvastatin (ZOCOR) tablet 40 mg  40 mg Oral q1800 Tacey Ruiz, MD   40 mg at 02/18/21 1607   sodium chloride (OCEAN) 0.65 % nasal spray 2 spray  2 spray Each Nare BID Tacey Ruiz, MD   2 spray at 02/19/21 0959   spironolactone (ALDACTONE) tablet 12.5 mg  12.5 mg Oral Daily Tacey Ruiz, MD   12.5 mg at 02/19/21 [phone]  tamsulosin (FLOMAX) capsule 0.4 mg  0.4 mg Oral QHS Tacey Ruiz, MD   0.4 mg at 02/18/21 2207   zolpidem (AMBIEN) tablet 5 mg  5 mg Oral QHS PRN Tacey Ruiz, MD         Discharge Medications: Please see discharge summary for a list of discharge medications.  Relevant Imaging Results:  Relevant Lab Results:   Additional Information  ([id number])  Fey Coghill, Juliann Pulse, RN

## 2021-02-19 NOTE — Progress Notes (Signed)
Mobility Specialist - Progress Note     02/19/21 1313  Mobility  Activity Ambulated in hall  Level of Assistance Minimal assist, patient does 75% or more  Assistive Device Front wheel walker  Distance Ambulated (ft) 280 ft  Mobility Ambulated with assistance in hallway  Mobility Response Tolerated well  Mobility performed by Mobility specialist  $Mobility charge 1 Mobility    Pt required no assistance to sit EOB, but did require Min A to stand up. Pt ambulated 280 ft in hallway using RW. Pt did not c/o of pain, dizziness, or SOB during session. Pt returned to bed after ambulation with bed alarm on and awaiting NT.   [place] Specialist Acute Rehabilitation Services Phone: [phone] 02/19/21, 1:17 PM

## 2021-02-19 NOTE — Plan of Care (Signed)
  Problem: Activity: Goal: Risk for activity intolerance will decrease Outcome: Progressing   Problem: Nutrition: Goal: Adequate nutrition will be maintained Outcome: Progressing   Problem: Pain Managment: Goal: General experience of comfort will improve Outcome: Progressing   Problem: Safety: Goal: Ability to remain free from injury will improve Outcome: Progressing   

## 2021-02-20 DIAGNOSIS — I10 Essential (primary) hypertension: Secondary | ICD-10-CM | POA: Diagnosis not present

## 2021-02-20 DIAGNOSIS — I2510 Atherosclerotic heart disease of native coronary artery without angina pectoris: Secondary | ICD-10-CM

## 2021-02-20 DIAGNOSIS — R310 Gross hematuria: Secondary | ICD-10-CM | POA: Diagnosis not present

## 2021-02-20 LAB — CBC
HCT: 31.7 % — ABNORMAL LOW (ref 39.0–52.0)
Hemoglobin: 10 g/dL — ABNORMAL LOW (ref 13.0–17.0)
MCH: 29.6 pg (ref 26.0–34.0)
MCHC: 31.5 g/dL (ref 30.0–36.0)
MCV: 93.8 fL (ref 80.0–100.0)
Platelets: 256 10*3/uL (ref 150–400)
RBC: 3.38 MIL/uL — ABNORMAL LOW (ref 4.22–5.81)
RDW: 14 % (ref 11.5–15.5)
WBC: 6.2 10*3/uL (ref 4.0–10.5)
nRBC: 0 % (ref 0.0–0.2)

## 2021-02-20 NOTE — Progress Notes (Signed)
Patient ID: Jesse [name], male   DOB: Dec 19, 1927, 87 y.o.   MRN: [id number]  The urine is clear off of CBI.  He continues to have bladder spasms.     BP 120/82 (BP Location: Right Arm)   Pulse 64   Temp 97.6 F (36.4 C) (Oral)   Resp 12   Ht 6' (1.829 m)   Wt 79.7 kg   SpO2 96%   BMI 23.83 kg/m   Imp: Radiation cystitis with hematuria which has resolved.  Plan:  D/C foley for voiding trial today.

## 2021-02-20 NOTE — Progress Notes (Signed)
PROGRESS NOTE    Jesse [name]  [id number] DOB: [birth year] DOA: 02/15/2021 PCP: [name], MD    Brief Narrative:  87 year old M with PMH of CAD/remote CABG, systolic CHF, prostate cancer s/p radiation tx in 2010, HTN, HLD, anemia, recent hospitalization from 7/1-7/5 for abdominal pain with gross hematuria and a renal stone when he was treated with cystoscopy, clot evacuation, fulguration and empiric antibiotics, returning with suprapubic pain and gross hematuria that has gotten worse over the last 2 weeks.  Hemodynamically stable.  H&H at baseline.  Urology consulted and placed 24 French coud tip three-way Foley catheter and irrigated with 1 L saline.  Urine seems to be clearing.  Urology recommending hyperbaric oxygen therapy for possible radiation cystitis and monitoring urine prior to discharge.  Not on CBI.  Assessment & Plan:   Principal Problem:   Gross hematuria Active Problems:   Mixed hyperlipidemia   Essential hypertension   Anemia associated with acute blood loss   Coronary artery disease involving native coronary artery of native heart without angina pectoris   Chronic systolic heart failure ([place])   History of prostate cancer   GERD without esophagitis   Radiation cystitis   Hematuria  Fever secondary to possible UTI:  -Recently noted to have fever to 102.3 -Remains hemodynamically stable -Urine culture thus far pos for gm neg organisms -s/p course of rocephin -Presently afebrile   Acute blood loss anemia -H&H currently stable. -Iron level 15, s/p course of IV iron -Suspect secondary to hematuria below   History of CAD/CABG-no anginal symptoms. Chronic systolic CHF: TTE in [id number] with LVEF of 40 to 45%.  No cardiopulmonary symptoms. Euvolemic on exam Essential hypertension: Normotensive. -Continue home Coreg and a statin. -Currently holding aspirin and Aldactone. -Cont to monitor volume status    History of prostate cancer s/p radiation treatment in 2013  with hematuria -Continue Flomax -Urology following with recommendations for close follow up and to keep cath in until 8/1 -Dark urine noted this AM. Urology following   GERD without esophagitis -Continue pantoprazole.   Insomnia: -Continue home Ambien and melatonin.     Chronic diarrhea:  -No further diarrhea documented   Debility/ambulatory dysfunction-uses walker at baseline. -PT/OT following Body mass index is 23.05 kg/m.  DVT prophylaxis: SCD's Code Status: DNR Family Communication: Pt in room, family not at bedside  Status is: Observation  The patient remains OBS appropriate and will d/c before 2 midnights.  Dispo: The patient is from: ALF              Anticipated d/c is to: ALF              Patient currently is not medically stable to d/c.   Difficult to place patient No  Consultants:  Urology  Procedures:    Antimicrobials: Anti-infectives (From admission, onward)    Start     Dose/Rate Route Frequency Ordered Stop   02/19/21 1445  sulfamethoxazole-trimethoprim (BACTRIM DS) 800-160 MG per tablet 1 tablet        1 tablet Oral Every 12 hours 02/19/21 1355 02/22/21 0559   02/16/21 1700  cefTRIAXone (ROCEPHIN) 1 g in sodium chloride 0.9 % 100 mL IVPB  Status:  Discontinued        1 g 200 mL/hr over 30 Minutes Intravenous Every 24 hours 02/16/21 1630 02/19/21 1355   02/16/21 0315  cephALEXin (KEFLEX) capsule 500 mg  Status:  Discontinued        500 mg Oral 3 times daily 02/16/21 0310  02/16/21 1630       Subjective: Without complaints today  Objective: Vitals:   02/20/21 0426 02/20/21 0446 02/20/21 0949 02/20/21 1340  BP: 120/82  100/68 106/71  Pulse: 64  75 64  Resp: 12   16  Temp: 97.6 F (36.4 C)   98.2 F (36.8 C)  TempSrc: Oral   Oral  SpO2: 96%   98%  Weight:  79.7 kg    Height:        Intake/Output Summary (Last 24 hours) at 02/20/2021 1718 Last data filed at 02/20/2021 [id number] Gross per 24 hour  Intake --  Output 1400 ml  Net -1400 ml     Filed Weights   02/18/21 0500 02/19/21 0500 02/20/21 0446  Weight: 82.4 kg 78.5 kg 79.7 kg    Examination: General exam: Conversant, in no acute distress Respiratory system: normal chest rise, clear, no audible wheezing Cardiovascular system: regular rhythm, s1-s2 Gastrointestinal system: Nondistended, nontender, pos BS Central nervous system: No seizures, no tremors Extremities: No cyanosis, no joint deformities Skin: No rashes, no pallor Psychiatry: Affect normal // no auditory hallucinations   Data Reviewed: I have personally reviewed following labs and imaging studies  CBC: Recent Labs  Lab 02/15/21 1350 02/16/21 0358 02/17/21 0455 02/18/21 0506 02/19/21 0513 02/20/21 0542  WBC 7.4 10.8* 6.5 5.5 5.9 6.2  NEUTROABS 5.7  --   --   --   --   --   HGB 10.6* 10.2* 10.2* 9.7* 9.9* 10.0*  HCT 32.8* 33.1* 33.0* 31.3* 31.6* 31.7*  MCV 91.9 94.8 96.5 95.1 94.9 93.8  PLT 284 249 201 185 224 [id number]    Basic Metabolic Panel: Recent Labs  Lab 02/15/21 1350 02/16/21 0358 02/17/21 0455 02/19/21 0513  NA 139 135 135 133*  K 3.8 3.8 4.0 4.1  CL 109 104 103 103  CO2 '22 23 24 26  '$ GLUCOSE 142* 107* 119* 98  BUN 26* '21 22 19  '$ CREATININE 0.94 0.73 0.87 0.75  CALCIUM 9.0 9.3 9.2 8.6*  MG  --   --  2.0  --   PHOS  --   --  2.3*  --     GFR: Estimated Creatinine Clearance: 67.4 mL/min (by C-G formula based on SCr of 0.75 mg/dL). Liver Function Tests: Recent Labs  Lab 02/17/21 0455 02/19/21 0513  AST  --  26  ALT  --  27  ALKPHOS  --  56  BILITOT  --  0.5  PROT  --  5.3*  ALBUMIN 3.4* 3.0*    No results for input(s): LIPASE, AMYLASE in the last 168 hours. No results for input(s): AMMONIA in the last 168 hours. Coagulation Profile: Recent Labs  Lab 02/16/21 0358  INR 1.2    Cardiac Enzymes: No results for input(s): CKTOTAL, CKMB, CKMBINDEX, TROPONINI in the last 168 hours. BNP (last 3 results) No results for input(s): PROBNP in the last 8760  hours. HbA1C: No results for input(s): HGBA1C in the last 72 hours. CBG: No results for input(s): GLUCAP in the last 168 hours. Lipid Profile: No results for input(s): CHOL, HDL, LDLCALC, TRIG, CHOLHDL, LDLDIRECT in the last 72 hours. Thyroid Function Tests: No results for input(s): TSH, T4TOTAL, FREET4, T3FREE, THYROIDAB in the last 72 hours. Anemia Panel: No results for input(s): VITAMINB12, FOLATE, FERRITIN, TIBC, IRON, RETICCTPCT in the last 72 hours.  Sepsis Labs: No results for input(s): PROCALCITON, LATICACIDVEN in the last 168 hours.  Recent Results (from the past 240 hour(s))  SARS CORONAVIRUS 2 (TAT  6-24 HRS) Nasopharyngeal Nasopharyngeal Swab     Status: None   Collection Time: 02/15/21  6:05 PM   Specimen: Nasopharyngeal Swab  Result Value Ref Range Status   SARS Coronavirus 2 NEGATIVE NEGATIVE Final    Comment: (NOTE) SARS-CoV-2 target nucleic acids are NOT DETECTED.  The SARS-CoV-2 RNA is generally detectable in upper and lower respiratory specimens during the acute phase of infection. Negative results do not preclude SARS-CoV-2 infection, do not rule out co-infections with other pathogens, and should not be used as the sole basis for treatment or other patient management decisions. Negative results must be combined with clinical observations, patient history, and epidemiological information. The expected result is Negative.  Fact Sheet for Patients: SugarRoll.be  Fact Sheet for Healthcare Providers: https://www.woods-[place]ws.com/  This test is not yet approved or cleared by the [place] FDA and  has been authorized for detection and/or diagnosis of SARS-CoV-2 by FDA under an Emergency Use Authorization (EUA). This EUA will remain  in effect (meaning this test can be used) for the duration of the COVID-19 declaration under Se ction 564(b)(1) of the Act, 21 U.S.C. section 360bbb-3(b)(1), unless the authorization is  terminated or revoked sooner.  Performed at [facility], [place] [address] [place], [place] [address]   Urine Culture     Status: Abnormal   Collection Time: 02/16/21  2:58 PM   Specimen: Urine, Catheterized  Result Value Ref Range Status   Specimen Description   Final    URINE, CATHETERIZED Performed at [place] [address] [place], [place] [address]    Special Requests   Final    NONE Performed at [facility], [place] [address] [place], [place] [address]    Culture >=100,000 COLONIES/mL ENTEROBACTER CLOACAE (A)  Final   Report Status 02/19/2021 FINAL  Final   Organism ID, Bacteria ENTEROBACTER CLOACAE (A)  Final      Susceptibility   Enterobacter cloacae - MIC*    CEFAZOLIN >=64 RESISTANT Resistant     CEFEPIME <=0.12 SENSITIVE Sensitive     CIPROFLOXACIN <=0.25 SENSITIVE Sensitive     GENTAMICIN <=1 SENSITIVE Sensitive     IMIPENEM 0.5 SENSITIVE Sensitive     NITROFURANTOIN 64 INTERMEDIATE Intermediate     TRIMETH/SULFA <=20 SENSITIVE Sensitive     PIP/TAZO >=128 RESISTANT Resistant     * >=100,000 COLONIES/mL ENTEROBACTER CLOACAE  Culture, blood (routine x 2)     Status: None (Preliminary result)   Collection Time: 02/16/21  5:12 PM   Specimen: BLOOD  Result Value Ref Range Status   Specimen Description   Final    BLOOD LEFT HAND Performed at [place] [address], [place], [place] [address]    Special Requests   Final    BOTTLES DRAWN AEROBIC AND ANAEROBIC Blood Culture adequate volume Performed at [facility] [address], [place], [place] [address]    Culture   Final    NO GROWTH 2 DAYS Performed at [facility] [address], [place], [place] [address]    Report Status PENDING  Incomplete  Culture, blood (routine x 2)     Status: None (Preliminary result)   Collection Time: 02/16/21  5:12 PM   Specimen: BLOOD  Result Value Ref Range Status   Specimen  Description   Final    BLOOD RIGHT ANTECUBITAL Performed at [facility] [address] [place], [place] [address]    Special Requests  Final    BOTTLES DRAWN AEROBIC AND ANAEROBIC Blood Culture adequate volume Performed at [facility] [address], [place], [place] [address]    Culture   Final    NO GROWTH 2 DAYS Performed at [facility] [address], [place], [place] [address]    Report Status PENDING  Incomplete      Radiology Studies: No results found.  Scheduled Meds:  carvedilol  3.125 mg Oral BID WC   Chlorhexidine Gluconate Cloth  6 each Topical Daily   docusate sodium  100 mg Oral BID   fenofibrate  160 mg Oral Daily   finasteride  5 mg Oral Daily   lactobacillus   Oral BID   melatonin  5 mg Oral QHS   pantoprazole  40 mg Oral Daily   polyethylene glycol  17 g Oral Daily   polyvinyl alcohol  2 drop Both Eyes QHS   sertraline  50 mg Oral Daily   simvastatin  40 mg Oral q1800   sodium chloride  2 spray Each Nare BID   spironolactone  12.5 mg Oral Daily   sulfamethoxazole-trimethoprim  1 tablet Oral Q12H   tamsulosin  0.4 mg Oral QHS   Continuous Infusions:     LOS: 1 day   Marylu Lund, MD [facility] Pager On Amion  If 7PM-7AM, please contact night-coverage 02/20/2021, 5:18 PM

## 2021-02-20 NOTE — Progress Notes (Signed)
Initial Nutrition Assessment  DOCUMENTATION CODES:   Not applicable  INTERVENTION:   Ensure Enlive po BID, each supplement provides 350 kcal and 20 grams of protein   MVI po daily   NUTRITION DIAGNOSIS:   Increased nutrient needs related to cancer and cancer related treatments as evidenced by estimated needs.  GOAL:   Patient will meet greater than or equal to 90% of their needs  MONITOR:   PO intake, Supplement acceptance, Labs, Weight trends, Skin, I & O's  REASON FOR ASSESSMENT:   Malnutrition Screening Tool    ASSESSMENT:   87 y.o. male with medical history significant for coronary artery disease (CABG), chronic systolic CHF (EF [id number] by January 2019 echocardiogram), prostate cancer 2010 with radiation treatments, hypertension, GERD, hyperlipidemia, anemia, AAA, MI, SBO (medically managed) and depression who is admitted with radiation cystitis and UTI  RD working remotely.  Unable to reach pt by phone. Pt with good appetite and oral intake in hospital. RD will add supplements and MVI to help pt meet his estimated needs. Per chart, pt appears weight stable at baseline.   Medications reviewed and include: colace, lactobacillus, melatonin, protonix, miralax, aldactone, bactrim  Labs reviewed: Na 133(L), K 4.1 wnl Hgb 10.0(L), Hct 31.7(L)  NUTRITION - FOCUSED PHYSICAL EXAM: Unable to perform at this time   Diet Order:   Diet Order             Diet regular Room service appropriate? Yes; Fluid consistency: Thin  Diet effective now                  EDUCATION NEEDS:   No education needs have been identified at this time  Skin:  Skin Assessment: Reviewed RN Assessment (ecchymosis, incision penis)  Last BM:  7/27- type 5  Height:   Ht Readings from Last 1 Encounters:  02/16/21 6' (1.829 m)    Weight:   Wt Readings from Last 1 Encounters:  02/20/21 79.7 kg    Ideal Body Weight:  80.9 kg  BMI:  Body mass index is 23.83 kg/m.  Estimated  Nutritional Needs:   Kcal:  2000-2300kcal/day  Protein:  100-115g/day  Fluid:  2.0-2.3L/day  Koleen Distance MS, RD, LDN Please refer to [facility] for RD and/or RD on-call/weekend/after hours pager

## 2021-02-21 DIAGNOSIS — R310 Gross hematuria: Secondary | ICD-10-CM | POA: Diagnosis not present

## 2021-02-21 DIAGNOSIS — I10 Essential (primary) hypertension: Secondary | ICD-10-CM | POA: Diagnosis not present

## 2021-02-21 DIAGNOSIS — I5022 Chronic systolic (congestive) heart failure: Secondary | ICD-10-CM | POA: Diagnosis not present

## 2021-02-21 LAB — COMPREHENSIVE METABOLIC PANEL
ALT: 21 U/L (ref 0–44)
AST: 18 U/L (ref 15–41)
Albumin: 3.2 g/dL — ABNORMAL LOW (ref 3.5–5.0)
Alkaline Phosphatase: 57 U/L (ref 38–126)
Anion gap: 9 (ref 5–15)
BUN: 19 mg/dL (ref 8–23)
CO2: 23 mmol/L (ref 22–32)
Calcium: 9.4 mg/dL (ref 8.9–10.3)
Chloride: 102 mmol/L (ref 98–111)
Creatinine, Ser: 1.13 mg/dL (ref 0.61–1.24)
GFR, Estimated: >60 mL/min (ref >60)
Glucose, Bld: 102 mg/dL — ABNORMAL HIGH (ref 70–99)
Potassium: 4.3 mmol/L (ref 3.5–5.1)
Sodium: 134 mmol/L — ABNORMAL LOW (ref 135–145)
Total Bilirubin: 0.5 mg/dL (ref 0.3–1.2)
Total Protein: 5.5 g/dL — ABNORMAL LOW (ref 6.5–8.1)

## 2021-02-21 LAB — CBC
HCT: 32.8 % — ABNORMAL LOW (ref 39.0–52.0)
Hemoglobin: 10.3 g/dL — ABNORMAL LOW (ref 13.0–17.0)
MCH: 29.5 pg (ref 26.0–34.0)
MCHC: 31.4 g/dL (ref 30.0–36.0)
MCV: 94 fL (ref 80.0–100.0)
Platelets: 286 10*3/uL (ref 150–400)
RBC: 3.49 MIL/uL — ABNORMAL LOW (ref 4.22–5.81)
RDW: 14.3 % (ref 11.5–15.5)
WBC: 5.4 10*3/uL (ref 4.0–10.5)
nRBC: 0 % (ref 0.0–0.2)

## 2021-02-21 LAB — CULTURE, BLOOD (ROUTINE X 2)
Culture: NO GROWTH
Culture: NO GROWTH
Special Requests: ADEQUATE
Special Requests: ADEQUATE

## 2021-02-21 NOTE — Progress Notes (Signed)
PROGRESS NOTE    Jesse [name]  [id number] DOB: 10-Feb-1928 DOA: 02/15/2021 PCP: [name], MD    Brief Narrative:  87 year old M with PMH of CAD/remote CABG, systolic CHF, prostate cancer s/p radiation tx in 2010, HTN, HLD, anemia, recent hospitalization from 7/1-7/5 for abdominal pain with gross hematuria and a renal stone when he was treated with cystoscopy, clot evacuation, fulguration and empiric antibiotics, returning with suprapubic pain and gross hematuria that has gotten worse over the last 2 weeks.  Hemodynamically stable.  H&H at baseline.  Urology consulted and placed 24 French coud tip three-way Foley catheter and irrigated with 1 L saline.  Urine seems to be clearing.  Urology recommending hyperbaric oxygen therapy for possible radiation cystitis and monitoring urine prior to discharge.  Not on CBI.  Assessment & Plan:   Principal Problem:   Gross hematuria Active Problems:   Mixed hyperlipidemia   Essential hypertension   Anemia associated with acute blood loss   Coronary artery disease involving native coronary artery of native heart without angina pectoris   Chronic systolic heart failure ([place])   History of prostate cancer   GERD without esophagitis   Radiation cystitis   Hematuria  Fever secondary to possible UTI:  -Recently noted to have fever to 102.3 -Remains hemodynamically stable -Urine culture thus far pos for gm neg organisms -s/p course of rocephin -Remains afebrile   Acute blood loss anemia -H&H currently stable. -Iron level 15, s/p course of IV iron -Suspect secondary to hematuria below -[place] thus far   History of CAD/CABG-no anginal symptoms. Chronic systolic CHF: TTE in [id number] with LVEF of 40 to 45%.  No cardiopulmonary symptoms. Euvolemic on exam Essential hypertension: Normotensive. -Continue home Coreg and a statin. -Currently holding aspirin and Aldactone. -Cont to monitor volume status    History of prostate cancer s/p radiation  treatment in 2013 with hematuria -Continue Flomax -Urology following -Cath removed. Per Urology, now ok to d/c from Urologic standpoint   GERD without esophagitis -Continue pantoprazole.   Insomnia: -Continue home Ambien and melatonin.     Chronic diarrhea:  -No further diarrhea documented   Debility/ambulatory dysfunction-uses walker at baseline. -PT/OT following Body mass index is 23.05 kg/m.  DVT prophylaxis: SCD's Code Status: DNR Family Communication: Pt in room, family not at bedside  Status is: Observation  The patient remains OBS appropriate and will d/c before 2 midnights.  Dispo: The patient is from: ALF              Anticipated d/c is to: ALF              Patient currently is not medically stable to d/c.   Difficult to place patient No  Consultants:  Urology  Procedures:    Antimicrobials: Anti-infectives (From admission, onward)    Start     Dose/Rate Route Frequency Ordered Stop   02/19/21 1445  sulfamethoxazole-trimethoprim (BACTRIM DS) 800-160 MG per tablet 1 tablet        1 tablet Oral Every 12 hours 02/19/21 1355 02/22/21 0559   02/16/21 1700  cefTRIAXone (ROCEPHIN) 1 g in sodium chloride 0.9 % 100 mL IVPB  Status:  Discontinued        1 g 200 mL/hr over 30 Minutes Intravenous Every 24 hours 02/16/21 1630 02/19/21 1355   02/16/21 0315  cephALEXin (KEFLEX) capsule 500 mg  Status:  Discontinued        500 mg Oral 3 times daily 02/16/21 0310 02/16/21 1630  Subjective: Eager to be discharging soon  Objective: Vitals:   02/21/21 0452 02/21/21 0500 02/21/21 1008 02/21/21 1256  BP: 117/77  110/66 115/72  Pulse: 63  66 60  Resp: 14   18  Temp: 97.8 F (36.6 C)   98 F (36.7 C)  TempSrc: Oral   Oral  SpO2: 96%   99%  Weight:  77.1 kg    Height:        Intake/Output Summary (Last 24 hours) at 02/21/2021 1603 Last data filed at 02/21/2021 1300 Gross per 24 hour  Intake 480 ml  Output 800 ml  Net -320 ml    Filed Weights    02/19/21 0500 02/20/21 0446 02/21/21 0500  Weight: 78.5 kg 79.7 kg 77.1 kg    Examination: General exam: Awake, laying in bed, in nad Respiratory system: Normal respiratory effort, no wheezing Cardiovascular system: regular rate, s1, s2 Gastrointestinal system: Soft, nondistended, positive BS Central nervous system: CN2-12 grossly intact, strength intact Extremities: Perfused, no clubbing Skin: Normal skin turgor, no notable skin lesions seen Psychiatry: Mood normal // no visual hallucinations   Data Reviewed: I have personally reviewed following labs and imaging studies  CBC: Recent Labs  Lab 02/15/21 1350 02/16/21 0358 02/17/21 0455 02/18/21 0506 02/19/21 0513 02/20/21 0542 02/21/21 0537  WBC 7.4   < > 6.5 5.5 5.9 6.2 5.4  NEUTROABS 5.7  --   --   --   --   --   --   HGB 10.6*   < > 10.2* 9.7* 9.9* 10.0* 10.3*  HCT 32.8*   < > 33.0* 31.3* 31.6* 31.7* 32.8*  MCV 91.9   < > 96.5 95.1 94.9 93.8 94.0  PLT 284   < > 201 185 224 256 286   < > = values in this interval not displayed.    Basic Metabolic Panel: Recent Labs  Lab 02/15/21 1350 02/16/21 0358 02/17/21 0455 02/19/21 0513 02/21/21 0537  NA 139 135 135 133* 134*  K 3.8 3.8 4.0 4.1 4.3  CL 109 104 103 103 102  CO2 '22 23 24 26 23  '$ GLUCOSE 142* 107* 119* 98 102*  BUN 26* '21 22 19 19  '$ CREATININE 0.94 0.73 0.87 0.75 1.13  CALCIUM 9.0 9.3 9.2 8.6* 9.4  MG  --   --  2.0  --   --   PHOS  --   --  2.3*  --   --     GFR: Estimated Creatinine Clearance: 47.4 mL/min (by C-G formula based on SCr of 1.13 mg/dL). Liver Function Tests: Recent Labs  Lab 02/17/21 0455 02/19/21 0513 02/21/21 0537  AST  --  26 18  ALT  --  27 21  ALKPHOS  --  56 57  BILITOT  --  0.5 0.5  PROT  --  5.3* 5.5*  ALBUMIN 3.4* 3.0* 3.2*    No results for input(s): LIPASE, AMYLASE in the last 168 hours. No results for input(s): AMMONIA in the last 168 hours. Coagulation Profile: Recent Labs  Lab 02/16/21 0358  INR 1.2     Cardiac Enzymes: No results for input(s): CKTOTAL, CKMB, CKMBINDEX, TROPONINI in the last 168 hours. BNP (last 3 results) No results for input(s): PROBNP in the last 8760 hours. HbA1C: No results for input(s): HGBA1C in the last 72 hours. CBG: No results for input(s): GLUCAP in the last 168 hours. Lipid Profile: No results for input(s): CHOL, HDL, LDLCALC, TRIG, CHOLHDL, LDLDIRECT in the last 72 hours. Thyroid Function Tests:  No results for input(s): TSH, T4TOTAL, FREET4, T3FREE, THYROIDAB in the last 72 hours. Anemia Panel: No results for input(s): VITAMINB12, FOLATE, FERRITIN, TIBC, IRON, RETICCTPCT in the last 72 hours.  Sepsis Labs: No results for input(s): PROCALCITON, LATICACIDVEN in the last 168 hours.  Recent Results (from the past 240 hour(s))  SARS CORONAVIRUS 2 (TAT 6-24 HRS) Nasopharyngeal Nasopharyngeal Swab     Status: None   Collection Time: 02/15/21  6:05 PM   Specimen: Nasopharyngeal Swab  Result Value Ref Range Status   SARS Coronavirus 2 NEGATIVE NEGATIVE Final    Comment: (NOTE) SARS-CoV-2 target nucleic acids are NOT DETECTED.  The SARS-CoV-2 RNA is generally detectable in upper and lower respiratory specimens during the acute phase of infection. Negative results do not preclude SARS-CoV-2 infection, do not rule out co-infections with other pathogens, and should not be used as the sole basis for treatment or other patient management decisions. Negative results must be combined with clinical observations, patient history, and epidemiological information. The expected result is Negative.  Fact Sheet for Patients: SugarRoll.be  Fact Sheet for Healthcare Providers: https://www.woods-mathews.com/  This test is not yet approved or cleared by the [place] FDA and  has been authorized for detection and/or diagnosis of SARS-CoV-2 by FDA under an Emergency Use Authorization (EUA). This EUA will remain  in  effect (meaning this test can be used) for the duration of the COVID-19 declaration under Se ction 564(b)(1) of the Act, 21 U.S.C. section 360bbb-3(b)(1), unless the authorization is terminated or revoked sooner.  Performed at [facility], [place] [address], [place], [place] [address]   Urine Culture     Status: Abnormal   Collection Time: 02/16/21  2:58 PM   Specimen: Urine, Catheterized  Result Value Ref Range Status   Specimen Description   Final    URINE, CATHETERIZED Performed at [facility] [address] [place], [place] [address]    Special Requests   Final    NONE Performed at [facility], [place] [address], [place], [place] [address]    Culture >=100,000 COLONIES/mL ENTEROBACTER CLOACAE (A)  Final   Report Status 02/19/2021 FINAL  Final   Organism ID, Bacteria ENTEROBACTER CLOACAE (A)  Final      Susceptibility   Enterobacter cloacae - MIC*    CEFAZOLIN >=64 RESISTANT Resistant     CEFEPIME <=0.12 SENSITIVE Sensitive     CIPROFLOXACIN <=0.25 SENSITIVE Sensitive     GENTAMICIN <=1 SENSITIVE Sensitive     IMIPENEM 0.5 SENSITIVE Sensitive     NITROFURANTOIN 64 INTERMEDIATE Intermediate     TRIMETH/SULFA <=20 SENSITIVE Sensitive     PIP/TAZO >=128 RESISTANT Resistant     * >=100,000 COLONIES/mL ENTEROBACTER CLOACAE  Culture, blood (routine x 2)     Status: None   Collection Time: 02/16/21  5:12 PM   Specimen: BLOOD  Result Value Ref Range Status   Specimen Description   Final    BLOOD LEFT HAND Performed at [facility] [address] [place], [place] [address]    Special Requests   Final    BOTTLES DRAWN AEROBIC AND ANAEROBIC Blood Culture adequate volume Performed at [facility] [address], [place], [place] [address]    Culture   Final    NO GROWTH 5 DAYS Performed at [facility], [place] [address], [place],  [address]    Report Status 02/21/2021 FINAL  Final   Culture, blood (routine x 2)  Status: None   Collection Time: 02/16/21  5:12 PM   Specimen: BLOOD  Result Value Ref Range Status   Specimen Description   Final    BLOOD RIGHT ANTECUBITAL Performed at [facility] [address], [place], [place] [address]    Special Requests   Final    BOTTLES DRAWN AEROBIC AND ANAEROBIC Blood Culture adequate volume Performed at [facility] [address], [place], [place] [address]    Culture   Final    NO GROWTH 5 DAYS Performed at [facility], [place] [address], [place], [place] [address]    Report Status 02/21/2021 FINAL  Final      Radiology Studies: No results found.  Scheduled Meds:  carvedilol  3.125 mg Oral BID WC   docusate sodium  100 mg Oral BID   fenofibrate  160 mg Oral Daily   finasteride  5 mg Oral Daily   lactobacillus   Oral BID   melatonin  5 mg Oral QHS   pantoprazole  40 mg Oral Daily   polyethylene glycol  17 g Oral Daily   polyvinyl alcohol  2 drop Both Eyes QHS   sertraline  50 mg Oral Daily   simvastatin  40 mg Oral q1800   sodium chloride  2 spray Each Nare BID   spironolactone  12.5 mg Oral Daily   sulfamethoxazole-trimethoprim  1 tablet Oral Q12H   tamsulosin  0.4 mg Oral QHS   Continuous Infusions:     LOS: 2 days   Marylu Lund, MD [facility] Pager On Amion  If 7PM-7AM, please contact night-coverage 02/21/2021, 4:03 PM

## 2021-02-21 NOTE — Progress Notes (Signed)
Patient ID: Jesse [name], male   DOB: 05/08/1928, 87 y.o.   MRN: [id number]  The foley is out and his urine is clear.  He is voiding without complaints.      BP 117/77 (BP Location: Left Arm)   Pulse 63   Temp 97.8 F (36.6 C) (Oral)   Resp 14   Ht 6' (1.829 m)   Wt 77.1 kg   SpO2 96%   BMI 23.05 kg/m   Imp: Radiation cystitis with hematuria which has resolved.  He is voiding well.  Should be ok for d/c from a urologic standpoint.   Plan is for him to be set up for hyperbaric Oxygen therapy.   Plan:  D/C foley for voiding trial today. Patient ID: Jesse [name], male   DOB: 12-Jan-1928, 87 y.o.   MRN: [id number]

## 2021-02-21 NOTE — Progress Notes (Signed)
Assumed care of pt from [place], [place] at [phone]. Agree with previous assessment. Pt resting in bed.

## 2021-02-22 DIAGNOSIS — I5022 Chronic systolic (congestive) heart failure: Secondary | ICD-10-CM | POA: Diagnosis not present

## 2021-02-22 DIAGNOSIS — R310 Gross hematuria: Secondary | ICD-10-CM | POA: Diagnosis not present

## 2021-02-22 DIAGNOSIS — I10 Essential (primary) hypertension: Secondary | ICD-10-CM | POA: Diagnosis not present

## 2021-02-22 LAB — RESP PANEL BY RT-PCR (FLU A&B, COVID) ARPGX2
Influenza A by PCR: NEGATIVE
Influenza B by PCR: NEGATIVE
SARS Coronavirus 2 by RT PCR: NEGATIVE

## 2021-02-22 NOTE — Progress Notes (Signed)
Physical Therapy Treatment Patient Details Name: Jesse [name] MRN: [id number] DOB: October 25, 1927 Today's Date: 02/22/2021    History of Present Illness Add Jesse [name] is a 87 y.o. male who presents with blood in urine. ED Course: Urinalysis was positive for blood. Dr. Claudia Desanctis placed a 24 French coud tip three-way Foley catheter and irrigated with 1 L saline. Of note, pt was recently admitted 01/22/2021 - 01/26/2021 for abdominal pain with gross hematuria with clots and finding of renal stone; he was treated with Foley catheter (removed prior to discharge), bladder irrigation, urology consult, and cystoscopy with clot evacuation and fulguration on 01/23/2021. PMH: HTN, hyperlipidemia, RBBB, CAD s/p CABG, AAA s/p repair and s/p bilateral femoral aneurysm repair in '95, thoracic aneurysm, GI bleed, prostate cancer s/p XRT, bladder tumor s/p resection, distant repair of bowel obstruction, hernia repair with mesh and recent admission for SBO.    PT Comments    Pt tolerates ambulation, reports bladder/prostate discomfort occasionally during ambulation. Pt able to clear past obstacles, complete turns and straight line gait without LOB, denies dizziness and denies SOB. Returned to Psychologist, occupational for lunch. RN notified of incorrect lunch order and clot substance in condom cath tubing.   Follow Up Recommendations  Home health PT;Supervision for mobility/OOB     Equipment Recommendations  None recommended by PT    Recommendations for Other Services       Precautions / Restrictions Precautions Precautions: Fall Restrictions Weight Bearing Restrictions: No    Mobility  Bed Mobility  General bed mobility comments: in recliner upon arrival    Transfers Overall transfer level: Needs assistance Equipment used: Rolling walker (2 wheeled) Transfers: Sit to/from Stand Sit to Stand: Min guard    General transfer comment: BUE assisting to power to stand from recliner, increased effort, good steadiness upon  rising  Ambulation/Gait Ambulation/Gait assistance: Supervision Gait Distance (Feet): 275 Feet Assistive device: Rolling walker (2 wheeled) Gait Pattern/deviations: Step-through pattern;Decreased stride length Gait velocity: decreased   General Gait Details: step through pattern with RW completing turns, direction changes, clearing past obstacles, good steadiness and no LOB   Stairs             Wheelchair Mobility    Modified Rankin (Stroke Patients Only)       Balance Overall balance assessment: Needs assistance  Standing balance support: During functional activity;Bilateral upper extremity supported Standing balance-Leahy Scale: Poor Standing balance comment: reliant on UE support     Cognition Arousal/Alertness: Awake/alert Behavior During Therapy: WFL for tasks assessed/performed Overall Cognitive Status: Within Functional Limits for tasks assessed         Exercises      General Comments        Pertinent Vitals/Pain Pain Assessment: Faces Faces Pain Scale: Hurts little more Pain Location: bladder Pain Descriptors / Indicators: Sore;Grimacing Pain Intervention(s): Limited activity within patient's tolerance;Monitored during session    Home Living                      Prior Function            PT Goals (current goals can now be found in the care plan section) Acute Rehab PT Goals Patient Stated Goal: return to [facility] PT Goal Formulation: With patient Time For Goal Achievement: 03/02/21 Potential to Achieve Goals: Good Progress towards PT goals: Progressing toward goals    Frequency    Min 3X/week      PT Plan Current plan remains appropriate    Co-evaluation  AM-PAC PT "6 Clicks" Mobility   Outcome Measure  Help needed turning from your back to your side while in a flat bed without using bedrails?: A Little Help needed moving from lying on your back to sitting on the side of a flat bed without using  bedrails?: A Little Help needed moving to and from a bed to a chair (including a wheelchair)?: A Little Help needed standing up from a chair using your arms (e.g., wheelchair or bedside chair)?: A Little Help needed to walk in hospital room?: A Little Help needed climbing 3-5 steps with a railing? : A Little 6 Click Score: 18    End of Session Equipment Utilized During Treatment: Gait belt Activity Tolerance: Patient tolerated treatment well Patient left: in chair;with call bell/phone within reach Nurse Communication: Mobility status;Other (comment) (clot? in condom catheter tubing) PT Visit Diagnosis: Other abnormalities of gait and mobility (R26.89)     Time: [id number] PT Time Calculation (min) (ACUTE ONLY): 15 min  Charges:  $Gait Training: 8-22 mins                      Tori Francine Hannan PT, DPT 02/22/21, 2:27 PM

## 2021-02-22 NOTE — TOC Progression Note (Addendum)
Transition of Care [facility]) - Progression Note    Patient Details  Name: Jesse [name] MRN: [id number] Date of Birth: 07-28-1927  [facility] of [facility] CM/SW Contact  Berniece Abid, Juliann Pulse, RN Phone Number: 02/22/2021, 1:22 PM  Clinical Narrative: Faxed w/confirmation HHPT/OT roders to [facility] [place] for [place] to do HHPT/OT. Awaiting d/c summary if d/c today.   2p-d/c summary received-rep [name] accepted return-going to rm#[id number],nsg call report tel#[phone]. PTAR called. No further CM needs.   Expected Discharge Plan: Assisted Living Barriers to Discharge: No Barriers Identified  Expected Discharge Plan and Services Expected Discharge Plan: Assisted Living   Discharge Planning Services: CM Consult Post Acute Care Choice: [place] arrangements for the past 2 months: Assisted Living Facility                           HH Arranged: PT, OT [place] Agency:  ([place] has own HHPT) Date [place]: 02/18/21 Time [place]: 1203 Representative spoke with at [place]: Erma @ [facility]   Social Determinants of Health ([place]) Interventions    Readmission Risk Interventions No flowsheet data found.

## 2021-02-22 NOTE — Discharge Summary (Signed)
Physician Discharge Summary  Jesse [name] [id number] DOB: 11-Apr-1928 DOA: 02/15/2021  PCP: [name], MD  Admit date: 02/15/2021 Discharge date: 02/22/2021  Admitted From: ALF Disposition:  ALF  Recommendations for Outpatient Follow-up:  Follow up with PCP in 1-2 weeks F/u with Urology as scheduled  Discharge Condition:Stable CODE STATUS:DNR Diet recommendation: Regular   Brief/Interim Summary: 87 year old M with PMH of CAD/remote CABG, systolic CHF, prostate cancer s/p radiation tx in 2010, HTN, HLD, anemia, recent hospitalization from 7/1-7/5 for abdominal pain with gross hematuria and a renal stone when he was treated with cystoscopy, clot evacuation, fulguration and empiric antibiotics, returning with suprapubic pain and gross hematuria that has gotten worse over the last 2 weeks.  Hemodynamically stable.  H&H at baseline.  Urology consulted and placed 24 French coud tip three-way Foley catheter and irrigated with 1 L saline.  Urine seems to be clearing.  Urology recommending hyperbaric oxygen therapy for possible radiation cystitis  Discharge Diagnoses:  Principal Problem:   Gross hematuria Active Problems:   Mixed hyperlipidemia   Essential hypertension   Anemia associated with acute blood loss   Coronary artery disease involving native coronary artery of native heart without angina pectoris   Chronic systolic heart failure (HCC)   History of prostate cancer   GERD without esophagitis   Radiation cystitis   Hematuria  Fever secondary to possible UTI:  -During this course, was noted to have fever to 102.3 -Remained hemodynamically stable -Urine culture was pos for enterobacter resistant to cefazolin, zosyn, intermediate resistance to nitrofurantoin -Improved with course of rocephin and later bactrim -Remained afebrile afterwards   Acute blood loss anemia -H&H currently stable. -Iron level 15, s/p course of IV iron -Suspect secondary to hematuria below   History  of CAD/CABG-no anginal symptoms. Chronic systolic CHF: TTE in [id number] with LVEF of 40 to 45%.  No cardiopulmonary symptoms. Euvolemic on exam Essential hypertension: Normotensive. -Continue home Coreg and a statin. -later resumed aldactone -Held aspirin -Cont to monitor volume status    History of prostate cancer s/p radiation treatment in 2013 with hematuria -Continue Flomax -Urology following -Pt noted to have hematuria requiring CBI, since discontinued -Cath removed. Per Urology, now ok to d/c from Urologic standpoint   GERD without esophagitis -Continue pantoprazole.   Insomnia: -Continue home Ambien and melatonin.     Chronic diarrhea:  -No further diarrhea documented   Debility/ambulatory dysfunction-uses walker at baseline. -PT/OT following Body mass index is 23.05 kg/m.    Discharge Instructions   Allergies as of 02/22/2021       Reactions   Quinolones Other (See Comments)   Unknown reaction   Adhesive [tape] Itching, Rash        Medication List     STOP taking these medications    aspirin 81 MG tablet   cephALEXin 500 MG capsule Commonly known as: KEFLEX       TAKE these medications    acetaminophen 500 MG tablet Commonly known as: TYLENOL Take 500 mg by mouth every 6 (six) hours as needed for mild pain or headache.   ACIDOPHILUS PROBIOTIC PO Take 1 capsule by mouth 2 (two) times daily.   alendronate 70 MG tablet Commonly known as: FOSAMAX Take 70 mg by mouth every Saturday.   carboxymethylcellulose 0.5 % Soln Commonly known as: REFRESH PLUS Place 2 drops into both eyes at bedtime.   carvedilol 3.125 MG tablet Commonly known as: COREG TAKE 1 TABLET BY MOUTH 2 TIMES DAILY WITH A MEAL What changed:  how much to take how to take this when to take this additional instructions   docusate sodium 100 MG capsule Commonly known as: Colace Take 1 capsule (100 mg total) by mouth 2 (two) times daily.   Dulcolax 5 MG EC tablet Generic drug:  bisacodyl Take 1 tablet (5 mg total) by mouth daily as needed for moderate constipation.   fenofibrate 160 MG tablet Take 1 tablet (160 mg total) by mouth daily.   finasteride 5 MG tablet Commonly known as: PROSCAR Take 5 mg by mouth daily.   melatonin 3 MG Tabs tablet Take 3 mg by mouth at bedtime as needed (sleep).   pantoprazole 40 MG tablet Commonly known as: PROTONIX Take 40 mg by mouth daily.   polyethylene glycol 17 g packet Commonly known as: MIRALAX / GLYCOLAX Take 17 g by mouth daily.   sertraline 50 MG tablet Commonly known as: ZOLOFT Take 50 mg by mouth daily.   simvastatin 40 MG tablet Commonly known as: ZOCOR Take 1 tablet (40 mg total) by mouth daily at 6 PM.   sodium chloride 0.65 % Soln nasal spray Commonly known as: OCEAN Place 2 sprays into both nostrils 2 (two) times daily.   spironolactone 25 MG tablet Commonly known as: ALDACTONE Take 12.5 mg by mouth daily.   tamsulosin 0.4 MG Caps capsule Commonly known as: FLOMAX Take 0.4 mg by mouth at bedtime.        Follow-up Information     Festus Aloe, MD Follow up.   Specialty: Urology Why: Please call the office to schedule a follow up visit with Dr. Junious Silk or one of our nurse practitioners for 2-3 weeks from discharge. Contact information: [place] [address] [phone]         [name], MD Follow up in 2 week(s).   Specialty: Family Medicine Why: Hospital follow up Contact information: [place] [address] [phone]         [name], MD .   Specialty: Cardiology Contact information: [id number] [place] [address] [phone]                Allergies  Allergen Reactions   Quinolones Other (See Comments)    Unknown reaction   Adhesive [Tape] Itching and Rash    Consultations: Urology  Procedures/Studies: No results found.  Subjective: Eager to be discharged  today  Discharge Exam: Vitals:   02/22/21 0329 02/22/21 1224  BP: 105/66 103/69  Pulse: 62 69  Resp: 16 18  Temp: 98.2 F (36.8 C) 98.6 F (37 C)  SpO2: 93% 99%   Vitals:   02/21/21 2017 02/22/21 0329 02/22/21 0500 02/22/21 1224  BP: 117/79 105/66  103/69  Pulse: 72 62  69  Resp: '16 16  18  '$ Temp: 98.6 F (37 C) 98.2 F (36.8 C)  98.6 F (37 C)  TempSrc: Oral Oral  Oral  SpO2:  93%  99%  Weight:   76.1 kg   Height:        General: Pt is alert, awake, not in acute distress Cardiovascular: RRR, S1/S2 + Respiratory: CTA bilaterally, no wheezing, no rhonchi Abdominal: Soft, NT, ND, bowel sounds + Extremities: no edema, no cyanosis   The results of significant diagnostics from this hospitalization (including imaging, microbiology, ancillary and laboratory) are listed below for reference.     Microbiology: Recent Results (from the past 240 hour(s))  SARS CORONAVIRUS 2 (TAT 6-24 HRS) Nasopharyngeal Nasopharyngeal Swab  Status: None   Collection Time: 02/15/21  6:05 PM   Specimen: Nasopharyngeal Swab  Result Value Ref Range Status   SARS Coronavirus 2 NEGATIVE NEGATIVE Final    Comment: (NOTE) SARS-CoV-2 target nucleic acids are NOT DETECTED.  The SARS-CoV-2 RNA is generally detectable in upper and lower respiratory specimens during the acute phase of infection. Negative results do not preclude SARS-CoV-2 infection, do not rule out co-infections with other pathogens, and should not be used as the sole basis for treatment or other patient management decisions. Negative results must be combined with clinical observations, patient history, and epidemiological information. The expected result is Negative.  Fact Sheet for Patients: SugarRoll.be  Fact Sheet for Healthcare Providers: https://www.woods-mathews.com/  This test is not yet approved or cleared by the [place] FDA and  has been authorized for detection and/or  diagnosis of SARS-CoV-2 by FDA under an Emergency Use Authorization (EUA). This EUA will remain  in effect (meaning this test can be used) for the duration of the COVID-19 declaration under Se ction 564(b)(1) of the Act, 21 U.S.C. section 360bbb-3(b)(1), unless the authorization is terminated or revoked sooner.  Performed at [facility], [place] [address], [place], [place] [address]   Urine Culture     Status: Abnormal   Collection Time: 02/16/21  2:58 PM   Specimen: Urine, Catheterized  Result Value Ref Range Status   Specimen Description   Final    URINE, CATHETERIZED Performed at [facility] [address], [place], [place] [address]    Special Requests   Final    NONE Performed at [facility], [place] [address], [place], [place] [address]    Culture >=100,000 COLONIES/mL ENTEROBACTER CLOACAE (A)  Final   Report Status 02/19/2021 FINAL  Final   Organism ID, Bacteria ENTEROBACTER CLOACAE (A)  Final      Susceptibility   Enterobacter cloacae - MIC*    CEFAZOLIN >=64 RESISTANT Resistant     CEFEPIME <=0.12 SENSITIVE Sensitive     CIPROFLOXACIN <=0.25 SENSITIVE Sensitive     GENTAMICIN <=1 SENSITIVE Sensitive     IMIPENEM 0.5 SENSITIVE Sensitive     NITROFURANTOIN 64 INTERMEDIATE Intermediate     TRIMETH/SULFA <=20 SENSITIVE Sensitive     PIP/TAZO >=128 RESISTANT Resistant     * >=100,000 COLONIES/mL ENTEROBACTER CLOACAE  Culture, blood (routine x 2)     Status: None   Collection Time: 02/16/21  5:12 PM   Specimen: BLOOD  Result Value Ref Range Status   Specimen Description   Final    BLOOD LEFT HAND Performed at [facility] [address], [place], [place] [address]    Special Requests   Final    BOTTLES DRAWN AEROBIC AND ANAEROBIC Blood Culture adequate volume Performed at [facility] [address] [place], [place] [address]    Culture   Final    NO GROWTH 5 DAYS Performed at [facility], [place] [address] [place], [place] [address]    Report Status 02/21/2021 FINAL  Final  Culture, blood (routine x 2)     Status: None   Collection Time: 02/16/21  5:12 PM   Specimen: BLOOD  Result Value Ref Range Status   Specimen Description   Final    BLOOD RIGHT ANTECUBITAL Performed at [facility] [address], [place],  [address]    Special Requests   Final    BOTTLES DRAWN AEROBIC AND ANAEROBIC Blood  Culture adequate volume Performed at [facility] [address] [place], [place] [address]    Culture   Final    NO GROWTH 5 DAYS Performed at [facility], [place] [address] [place], [place] [address]    Report Status 02/21/2021 FINAL  Final  Resp Panel by RT-PCR (Flu A&B, Covid) Nasopharyngeal Swab     Status: None   Collection Time: 02/22/21  8:36 AM   Specimen: Nasopharyngeal Swab; Nasopharyngeal(NP) swabs in vial transport medium  Result Value Ref Range Status   SARS Coronavirus 2 by RT PCR NEGATIVE NEGATIVE Final    Comment: (NOTE) SARS-CoV-2 target nucleic acids are NOT DETECTED.  The SARS-CoV-2 RNA is generally detectable in upper respiratory specimens during the acute phase of infection. The lowest concentration of SARS-CoV-2 viral copies this assay can detect is 138 copies/mL. A negative result does not preclude SARS-Cov-2 infection and should not be used as the sole basis for treatment or other patient management decisions. A negative result may occur with  improper specimen collection/handling, submission of specimen other than nasopharyngeal swab, presence of viral mutation(s) within the areas targeted by this assay, and inadequate number of viral copies(<138 copies/mL). A negative result must be combined with clinical observations, patient history, and epidemiological information. The expected result is Negative.  Fact Sheet for Patients:  EntrepreneurPulse.com.au  Fact  Sheet for Healthcare Providers:  IncredibleEmployment.be  This test is no t yet approved or cleared by the [place] FDA and  has been authorized for detection and/or diagnosis of SARS-CoV-2 by FDA under an Emergency Use Authorization (EUA). This EUA will remain  in effect (meaning this test can be used) for the duration of the COVID-19 declaration under Section 564(b)(1) of the Act, 21 U.S.C.section 360bbb-3(b)(1), unless the authorization is terminated  or revoked sooner.       Influenza A by PCR NEGATIVE NEGATIVE Final   Influenza B by PCR NEGATIVE NEGATIVE Final    Comment: (NOTE) The Xpert Xpress SARS-CoV-2/FLU/RSV plus assay is intended as an aid in the diagnosis of influenza from Nasopharyngeal swab specimens and should not be used as a sole basis for treatment. Nasal washings and aspirates are unacceptable for Xpert Xpress SARS-CoV-2/FLU/RSV testing.  Fact Sheet for Patients: EntrepreneurPulse.com.au  Fact Sheet for Healthcare Providers: IncredibleEmployment.be  This test is not yet approved or cleared by the [place] FDA and has been authorized for detection and/or diagnosis of SARS-CoV-2 by FDA under an Emergency Use Authorization (EUA). This EUA will remain in effect (meaning this test can be used) for the duration of the COVID-19 declaration under Section 564(b)(1) of the Act, 21 U.S.C. section 360bbb-3(b)(1), unless the authorization is terminated or revoked.  Performed at [facility], [place] [address], [place],  [address]      Labs: BNP (last 3 results) No results for input(s): BNP in the last 8760 hours. Basic Metabolic Panel: Recent Labs  Lab 02/15/21 1350 02/16/21 0358 02/17/21 0455 02/19/21 0513 02/21/21 0537  NA 139 135 135 133* 134*  K 3.8 3.8 4.0 4.1 4.3  CL 109 104 103 103 102  CO2 '22 23 24 26 23  '$ GLUCOSE 142* 107* 119* 98 102*  BUN 26* '21 22 19 19   '$ CREATININE 0.94 0.73 0.87 0.75 1.13  CALCIUM 9.0 9.3 9.2 8.6* 9.4  MG  --   --  2.0  --   --   PHOS  --   --  2.3*  --   --  Liver Function Tests: Recent Labs  Lab 02/17/21 0455 02/19/21 0513 02/21/21 0537  AST  --  26 18  ALT  --  27 21  ALKPHOS  --  56 57  BILITOT  --  0.5 0.5  PROT  --  5.3* 5.5*  ALBUMIN 3.4* 3.0* 3.2*   No results for input(s): LIPASE, AMYLASE in the last 168 hours. No results for input(s): AMMONIA in the last 168 hours. CBC: Recent Labs  Lab 02/15/21 1350 02/16/21 0358 02/17/21 0455 02/18/21 0506 02/19/21 0513 02/20/21 0542 02/21/21 0537  WBC 7.4   < > 6.5 5.5 5.9 6.2 5.4  NEUTROABS 5.7  --   --   --   --   --   --   HGB 10.6*   < > 10.2* 9.7* 9.9* 10.0* 10.3*  HCT 32.8*   < > 33.0* 31.3* 31.6* 31.7* 32.8*  MCV 91.9   < > 96.5 95.1 94.9 93.8 94.0  PLT 284   < > 201 185 224 256 286   < > = values in this interval not displayed.   Cardiac Enzymes: No results for input(s): CKTOTAL, CKMB, CKMBINDEX, TROPONINI in the last 168 hours. BNP: Invalid input(s): POCBNP CBG: No results for input(s): GLUCAP in the last 168 hours. D-Dimer No results for input(s): DDIMER in the last 72 hours. Hgb A1c No results for input(s): HGBA1C in the last 72 hours. Lipid Profile No results for input(s): CHOL, HDL, LDLCALC, TRIG, CHOLHDL, LDLDIRECT in the last 72 hours. Thyroid function studies No results for input(s): TSH, T4TOTAL, T3FREE, THYROIDAB in the last 72 hours.  Invalid input(s): FREET3 Anemia work up No results for input(s): VITAMINB12, FOLATE, FERRITIN, TIBC, IRON, RETICCTPCT in the last 72 hours. Urinalysis    Component Value Date/Time   COLORURINE RED (A) 02/15/2021 1341   APPEARANCEUR TURBID (A) 02/15/2021 1341   LABSPEC  02/15/2021 1341    TEST NOT REPORTED DUE TO COLOR INTERFERENCE OF URINE PIGMENT   PHURINE  02/15/2021 1341    TEST NOT REPORTED DUE TO COLOR INTERFERENCE OF URINE PIGMENT   GLUCOSEU (A) 02/15/2021 1341    TEST NOT  REPORTED DUE TO COLOR INTERFERENCE OF URINE PIGMENT   HGBUR (A) 02/15/2021 1341    TEST NOT REPORTED DUE TO COLOR INTERFERENCE OF URINE PIGMENT   BILIRUBINUR (A) 02/15/2021 1341    TEST NOT REPORTED DUE TO COLOR INTERFERENCE OF URINE PIGMENT   KETONESUR (A) 02/15/2021 1341    TEST NOT REPORTED DUE TO COLOR INTERFERENCE OF URINE PIGMENT   PROTEINUR (A) 02/15/2021 1341    TEST NOT REPORTED DUE TO COLOR INTERFERENCE OF URINE PIGMENT   UROBILINOGEN 2.0 (H) 05/28/2015 0029   NITRITE (A) 02/15/2021 1341    TEST NOT REPORTED DUE TO COLOR INTERFERENCE OF URINE PIGMENT   LEUKOCYTESUR (A) 02/15/2021 1341    TEST NOT REPORTED DUE TO COLOR INTERFERENCE OF URINE PIGMENT   Sepsis Labs Invalid input(s): PROCALCITONIN,  WBC,  LACTICIDVEN Microbiology Recent Results (from the past 240 hour(s))  SARS CORONAVIRUS 2 (TAT 6-24 HRS) Nasopharyngeal Nasopharyngeal Swab     Status: None   Collection Time: 02/15/21  6:05 PM   Specimen: Nasopharyngeal Swab  Result Value Ref Range Status   SARS Coronavirus 2 NEGATIVE NEGATIVE Final    Comment: (NOTE) SARS-CoV-2 target nucleic acids are NOT DETECTED.  The SARS-CoV-2 RNA is generally detectable in upper and lower respiratory specimens during the acute phase of infection. Negative results do not preclude SARS-CoV-2 infection, do not rule  out co-infections with other pathogens, and should not be used as the sole basis for treatment or other patient management decisions. Negative results must be combined with clinical observations, patient history, and epidemiological information. The expected result is Negative.  Fact Sheet for Patients: SugarRoll.be  Fact Sheet for Healthcare Providers: https://www.woods-mathews.com/  This test is not yet approved or cleared by the [place] FDA and  has been authorized for detection and/or diagnosis of SARS-CoV-2 by FDA under an Emergency Use Authorization (EUA). This EUA  will remain  in effect (meaning this test can be used) for the duration of the COVID-19 declaration under Se ction 564(b)(1) of the Act, 21 U.S.C. section 360bbb-3(b)(1), unless the authorization is terminated or revoked sooner.  Performed at [facility], [place] [address], [place], [place] [address]   Urine Culture     Status: Abnormal   Collection Time: 02/16/21  2:58 PM   Specimen: Urine, Catheterized  Result Value Ref Range Status   Specimen Description   Final    URINE, CATHETERIZED Performed at [facility] [address], [place], [place] [address]    Special Requests   Final    NONE Performed at [facility], [place] [address] [place], [place] [address]    Culture >=100,000 COLONIES/mL ENTEROBACTER CLOACAE (A)  Final   Report Status 02/19/2021 FINAL  Final   Organism ID, Bacteria ENTEROBACTER CLOACAE (A)  Final      Susceptibility   Enterobacter cloacae - MIC*    CEFAZOLIN >=64 RESISTANT Resistant     CEFEPIME <=0.12 SENSITIVE Sensitive     CIPROFLOXACIN <=0.25 SENSITIVE Sensitive     GENTAMICIN <=1 SENSITIVE Sensitive     IMIPENEM 0.5 SENSITIVE Sensitive     NITROFURANTOIN 64 INTERMEDIATE Intermediate     TRIMETH/SULFA <=20 SENSITIVE Sensitive     PIP/TAZO >=128 RESISTANT Resistant     * >=100,000 COLONIES/mL ENTEROBACTER CLOACAE  Culture, blood (routine x 2)     Status: None   Collection Time: 02/16/21  5:12 PM   Specimen: BLOOD  Result Value Ref Range Status   Specimen Description   Final    BLOOD LEFT HAND Performed at [facility] [address] [place], [place] [address]    Special Requests   Final    BOTTLES DRAWN AEROBIC AND ANAEROBIC Blood Culture adequate volume Performed at [facility] [address] [place], [place] [address]    Culture   Final    NO GROWTH 5 DAYS Performed at [facility], [place] [address], [place], [place] [address]    Report Status  02/21/2021 FINAL  Final  Culture, blood (routine x 2)     Status: None   Collection Time: 02/16/21  5:12 PM   Specimen: BLOOD  Result Value Ref Range Status   Specimen Description   Final    BLOOD RIGHT ANTECUBITAL Performed at [facility] [address], [place], [place] [address]    Special Requests   Final    BOTTLES DRAWN AEROBIC AND ANAEROBIC Blood Culture adequate volume Performed at [facility] [address], [place], [place] [address]    Culture   Final    NO GROWTH 5 DAYS Performed at [facility], [place] [address], [place],  [address]    Report Status 02/21/2021 FINAL  Final  Resp Panel by RT-PCR (Flu A&B, Covid) Nasopharyngeal Swab     Status: None  Collection Time: 02/22/21  8:36 AM   Specimen: Nasopharyngeal Swab; Nasopharyngeal(NP) swabs in vial transport medium  Result Value Ref Range Status   SARS Coronavirus 2 by RT PCR NEGATIVE NEGATIVE Final    Comment: (NOTE) SARS-CoV-2 target nucleic acids are NOT DETECTED.  The SARS-CoV-2 RNA is generally detectable in upper respiratory specimens during the acute phase of infection. The lowest concentration of SARS-CoV-2 viral copies this assay can detect is 138 copies/mL. A negative result does not preclude SARS-Cov-2 infection and should not be used as the sole basis for treatment or other patient management decisions. A negative result may occur with  improper specimen collection/handling, submission of specimen other than nasopharyngeal swab, presence of viral mutation(s) within the areas targeted by this assay, and inadequate number of viral copies(<138 copies/mL). A negative result must be combined with clinical observations, patient history, and epidemiological information. The expected result is Negative.  Fact Sheet for Patients:  EntrepreneurPulse.com.au  Fact Sheet for Healthcare Providers:   IncredibleEmployment.be  This test is no t yet approved or cleared by the [place] FDA and  has been authorized for detection and/or diagnosis of SARS-CoV-2 by FDA under an Emergency Use Authorization (EUA). This EUA will remain  in effect (meaning this test can be used) for the duration of the COVID-19 declaration under Section 564(b)(1) of the Act, 21 U.S.C.section 360bbb-3(b)(1), unless the authorization is terminated  or revoked sooner.       Influenza A by PCR NEGATIVE NEGATIVE Final   Influenza B by PCR NEGATIVE NEGATIVE Final    Comment: (NOTE) The Xpert Xpress SARS-CoV-2/FLU/RSV plus assay is intended as an aid in the diagnosis of influenza from Nasopharyngeal swab specimens and should not be used as a sole basis for treatment. Nasal washings and aspirates are unacceptable for Xpert Xpress SARS-CoV-2/FLU/RSV testing.  Fact Sheet for Patients: EntrepreneurPulse.com.au  Fact Sheet for Healthcare Providers: IncredibleEmployment.be  This test is not yet approved or cleared by the [place] FDA and has been authorized for detection and/or diagnosis of SARS-CoV-2 by FDA under an Emergency Use Authorization (EUA). This EUA will remain in effect (meaning this test can be used) for the duration of the COVID-19 declaration under Section 564(b)(1) of the Act, 21 U.S.C. section 360bbb-3(b)(1), unless the authorization is terminated or revoked.  Performed at [facility], [place] [address], [place], [place] [address]    Time spent: 30 min  SIGNED:   Marylu Lund, MD  [facility] 02/22/2021, 1:40 PM  If 7PM-7AM, please contact night-coverage

## 2021-02-22 NOTE — Care Management Important Message (Signed)
Important Message  Patient Details IM Letter placed in Patient's room. Name: Jesse [name] MRN: [id number] Date of Birth: 13-Oct-1927   Medicare Important Message Given:  Yes     Kerin Salen 02/22/2021, 2:04 PM

## 2021-02-22 NOTE — Progress Notes (Signed)
Pt to be discharged back to [facility] this afternoon. Kathrynn Humble RN accepting report for this facility.

## 2021-02-25 DIAGNOSIS — Z8546 Personal history of malignant neoplasm of prostate: Secondary | ICD-10-CM | POA: Diagnosis not present

## 2021-02-25 DIAGNOSIS — I5030 Unspecified diastolic (congestive) heart failure: Secondary | ICD-10-CM | POA: Diagnosis not present

## 2021-02-25 DIAGNOSIS — N3040 Irradiation cystitis without hematuria: Secondary | ICD-10-CM | POA: Diagnosis not present

## 2021-02-25 DIAGNOSIS — N390 Urinary tract infection, site not specified: Secondary | ICD-10-CM | POA: Diagnosis not present

## 2021-02-26 DIAGNOSIS — R278 Other lack of coordination: Secondary | ICD-10-CM | POA: Diagnosis not present

## 2021-03-01 DIAGNOSIS — R278 Other lack of coordination: Secondary | ICD-10-CM | POA: Diagnosis not present

## 2021-03-01 DIAGNOSIS — R310 Gross hematuria: Secondary | ICD-10-CM | POA: Diagnosis not present

## 2021-03-01 DIAGNOSIS — N3041 Irradiation cystitis with hematuria: Secondary | ICD-10-CM | POA: Diagnosis not present

## 2021-03-01 DIAGNOSIS — C61 Malignant neoplasm of prostate: Secondary | ICD-10-CM | POA: Diagnosis not present

## 2021-03-02 ENCOUNTER — Non-Acute Institutional Stay: Payer: Medicare Other | Admitting: *Deleted

## 2021-03-02 ENCOUNTER — Other Ambulatory Visit: Payer: Self-pay

## 2021-03-02 ENCOUNTER — Non-Acute Institutional Stay: Payer: Medicare Other

## 2021-03-02 VITALS — BP 100/61 | HR 68 | Temp 97.7°F | Resp 18

## 2021-03-02 DIAGNOSIS — Z515 Encounter for palliative care: Secondary | ICD-10-CM

## 2021-03-02 NOTE — Progress Notes (Signed)
COMMUNITY PALLIATIVE CARE SW NOTE  PATIENT NAME: Jesse [name] DOB: 1927/12/08 MRN: [id number]  PRIMARY CARE PROVIDER: [name], MD  RESPONSIBLE PARTY:  Acct ID - Guarantor Home Phone Work Phone Relationship Acct Type  [id number] - NOAM, ARUTYUNYAN [phone]  Self P/F     [facility], [place], [place], [place] [address]     PLAN OF CARE and INTERVENTIONS:             GOALS OF CARE/ ADVANCE CARE PLANNING:  Goal is for patient to remain at the facility. Patient is a DNR.  SOCIAL/EMOTIONAL/SPIRITUAL ASSESSMENT/ INTERVENTIONS: SW and RN- M. Nadara Mustard completed an initial visit with patient at the facility. Patient was provided education regarding palliative care services, role in his care and visit frequency. Patient provided verbal consent to services. He provided a status update on himself. He advised that he was recently hospitalized. He is currently taking an ABT to treat a UTI. He reports that he is slowly getting back to baseline since returning to the facility. He report that he is independent for ADL's. He utilizes a walker to ambulate. He sits on a cushion on his couch for additional comfort. Patient report that his appetite is good and enjoys the meals served at the facility. Patient denied pain. He has no issues with swallowing or taking his medications. He also reports that he sleeps better as he is still trying to get back to his normal sleep pattern since returning from the hospital. Patient provided background on himself, noting that he was born and raised in [place]. He is widowed and have two children who are supportive and live within an hour of the facility. He worked in [employer] before retiring. He is a DNR and he has advanced directives on file.  Patient is a member of [employer]. Along with going out with family members, he still tries to attend his fraternal and [employer]  SW provided supportive presence, active listening, education,  assessment of needs and comfort of patient and consultation with facility staff to ensure patient's needs and comfort are maintained. Palliative care support will continue.   PATIENT/CAREGIVER EDUCATION/ COPING:  Patient appear to be alert and oriented x 4. He appears to be coping well. He expressed that he has a supportive network of family and friends.  PERSONAL EMERGENCY PLAN:  Per facility protocol. COMMUNITY RESOURCES COORDINATION/ HEALTH CARE NAVIGATION:  None. FINANCIAL/LEGAL CONCERNS/INTERVENTIONS:  None.     SOCIAL HX:  Social History   Tobacco Use   Smoking status: Former    Years: 20.00    Types: Cigarettes    Quit date: 07/25/1968    Years since quitting: 52.6   Smokeless tobacco: Never  Substance Use Topics   Alcohol use: No    CODE STATUS: DNR ADVANCED DIRECTIVES: Yes MOST FORM COMPLETE:  No HOSPICE EDUCATION PROVIDED: No  PPS: Patient is alert and oriented x 4. He is independent for ADL's. He ambulates with a walker. He reports that his appetite is good. Patient is currently being treated for a UTI.   Duration of visit and documentation: 30 minutes.   [address] [place], [place]

## 2021-03-02 NOTE — Progress Notes (Signed)
[facility] COMMUNITY PALLIATIVE CARE RN NOTE  PATIENT NAME: VIYAAN CHAMPINE DOB: November 11, 1927 MRN: [id number]  PRIMARY CARE PROVIDER: [name], MD  RESPONSIBLE PARTY:  Acct ID - Guarantor Home Phone Work Phone Relationship Acct Type  [id number] - LUCIUS, WISE [phone]  Self P/F     [facility], [address] [place], [place] [address]   Covid-19 Pre-screening Negative  PLAN OF CARE and INTERVENTION:  ADVANCE CARE PLANNING/GOALS OF CARE: Goal is for patient to remain at current facility, [facility]. He has a DNR. PATIENT/CAREGIVER EDUCATION: Symptom management, safe mobility, s/s of infection DISEASE STATUS: Joint palliative care follow-up visit completed with LCSW, M. Lonon. Met with patient in his apartment. He is alert and oriented x 4 and able to engage in appropriate conversation. Mood is pleasant. He denies any pain or discomfort. Respirations are even, regular and unlabored. He is ambulatory using a walker. No recent falls. He was recently hospitalized from 02/15/21 - 02/22/21 for gross hematuria. His aspirin has been discontinued. He was febrile due to a UTI and treated with IV antibiotics. He says he is currently on oral antibiotics. He is starting to feel better overall. His strengthening continues to improve. Prior to this, he had a hospitalization from 01/22/21-01/26/21 also for gross hematuria. He was found to have a renal stone. He received a cystoscopy with clot evacuation and fulguration on 01/23/21. He is very pleased with [facility] and feels they are managing his issues very well. His appetite is good. No issues with dysphagia. No complaints this visit.   HISTORY OF PRESENT ILLNESS:  This is a 87 yo male with a history of CAD/remote CABG, systolic CHF, prostate cancer s/p radiation tx in 2010, HTN, HLD and anemia. Palliative care team continues to follow patient for additional support, goals of care and complex decision making.   CODE STATUS: DNR   ADVANCED DIRECTIVES: Y MOST FORM: no PPS: 50%   PHYSICAL EXAM:   VITALS: Today's Vitals   03/02/21 0946  BP: 100/61  Pulse: 68  Resp: 18  Temp: 97.7 F (36.5 C)  TempSrc: Temporal  SpO2: 97%  PainSc: 0-No pain    LUNGS: clear to auscultation  CARDIAC: Cor RRR EXTREMITIES: No edema SKIN:  Exposed skin is dry and intact   NEURO:  Alert and oriented x 4, pleasant mood, generalized weakness, ambulatory w/walker   (Duration of visit and documentation 30 minutes)   Daryl Eastern, RN BSN

## 2021-03-04 ENCOUNTER — Other Ambulatory Visit: Payer: Self-pay | Admitting: Surgery

## 2021-03-04 DIAGNOSIS — I712 Thoracic aortic aneurysm, without rupture, unspecified: Secondary | ICD-10-CM

## 2021-03-04 DIAGNOSIS — E559 Vitamin D deficiency, unspecified: Secondary | ICD-10-CM | POA: Diagnosis not present

## 2021-03-04 DIAGNOSIS — E569 Vitamin deficiency, unspecified: Secondary | ICD-10-CM | POA: Diagnosis not present

## 2021-03-04 DIAGNOSIS — D511 Vitamin B12 deficiency anemia due to selective vitamin B12 malabsorption with proteinuria: Secondary | ICD-10-CM | POA: Diagnosis not present

## 2021-03-04 DIAGNOSIS — I10 Essential (primary) hypertension: Secondary | ICD-10-CM | POA: Diagnosis not present

## 2021-03-08 DIAGNOSIS — R278 Other lack of coordination: Secondary | ICD-10-CM | POA: Diagnosis not present

## 2021-03-15 DIAGNOSIS — R278 Other lack of coordination: Secondary | ICD-10-CM | POA: Diagnosis not present

## 2021-03-19 DIAGNOSIS — R278 Other lack of coordination: Secondary | ICD-10-CM | POA: Diagnosis not present

## 2021-03-22 DIAGNOSIS — R278 Other lack of coordination: Secondary | ICD-10-CM | POA: Diagnosis not present

## 2021-03-24 DIAGNOSIS — R278 Other lack of coordination: Secondary | ICD-10-CM | POA: Diagnosis not present

## 2021-03-25 ENCOUNTER — Encounter (HOSPITAL_BASED_OUTPATIENT_CLINIC_OR_DEPARTMENT_OTHER): Payer: Medicare Other | Attending: Internal Medicine | Admitting: Internal Medicine

## 2021-03-25 ENCOUNTER — Other Ambulatory Visit: Payer: Self-pay

## 2021-03-25 DIAGNOSIS — Z951 Presence of aortocoronary bypass graft: Secondary | ICD-10-CM | POA: Insufficient documentation

## 2021-03-25 DIAGNOSIS — N3041 Irradiation cystitis with hematuria: Secondary | ICD-10-CM | POA: Diagnosis not present

## 2021-03-25 DIAGNOSIS — Z923 Personal history of irradiation: Secondary | ICD-10-CM | POA: Insufficient documentation

## 2021-03-25 DIAGNOSIS — Z8546 Personal history of malignant neoplasm of prostate: Secondary | ICD-10-CM | POA: Diagnosis not present

## 2021-03-25 DIAGNOSIS — L598 Other specified disorders of the skin and subcutaneous tissue related to radiation: Secondary | ICD-10-CM | POA: Diagnosis not present

## 2021-03-25 NOTE — Progress Notes (Signed)
Jesse [name], Jesse [name] ([id number]) Visit Report for 03/25/2021 Chief Complaint Document Details Patient Name: Date of Service: [place], [name] 03/25/2021 2:45 PM Medical Record Number: [id number] Patient Account Number: [id number] Date of Birth/Sex: Treating RN: 07-05-1928 (87 y.o. 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 03/25/2021 2:45 PM Medical Record Number: [id number] Patient Account Number: [id number] Date of Birth/Sex: Treating RN: 07/17/1928 (87 y.o. Jesse [name]: Jesse [name]: Referring [name]: Treating [name]/Extender: Jesse [name] in Treatment: 0 History of Present Illness HPI Description: ADMISSION 03/25/2021 This is a 87 year old man with a history of prostate cancer in 2000 treated with radiation in 2010. We do not have a lot of details on this. The patient has been followed [name] Jesse [name] and since his retirement more recently [name] Jesse [name] at [facility] urology. Patient has a fairly long history of intermittent gross hematuria being admitted to hospital in 09/04/2019 for hematuria and clot retention. In January 2021 he was taken to the operating room and underwent fulguration of bleeding within the prostatic urethra. The patient tells me that since then he intermittently has had episodes of gross hematuria which eventually gets better but he always has some visible blood in his urine. He  has urinary frequency and incontinence. He was admitted to hospital from 01/22/2021 through 02/05/2021 with gross hematuria and and abdominal pain. A CT scan showed bladder distention he had a cystoscopy. He had clot extraction. There was hypervascularity of the trigone. There was active bleeding from the bladder neck and prostatic urethra. 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 The bleeding stopped with continuous bladder irrigation and his Foley catheter was removed. Since then he has continued to have gross hematuria but more recently simply red urine. He had some discomfort after they remove the Foley catheter but he says this is getting better. He has been referred for consideration of hyperbaric oxygen therapy The patient has a history of coronary artery disease status post CABG. His last echocardiogram was in 2019 at which time the patient's ejection fraction was in the range of 40 to 45%. He does not have a description of chest pain or exertional shortness of breath. He seems to be fairly active. He also has a history of a thoracic aortic aneurysm, left 6th nerve palsy history of surgery for a small bowel obstruction on 12/12/2020, hernia repair, AAA repair and bilateral hip fractures. He has gastroesophageal reflux disease hyperlipidemia. The patient lives at [facility] which I gather is a combination of an assisted living with an independent living he is in the assisted living level Electronic Signature(s) Signed: 03/25/2021 5:31:22 PM [name]: Jesse [name]: Jesse [name] on 03/25/2021 16:41:54 -------------------------------------------------------------------------------- Physical Exam Details Patient Name: Date of Service: Jesse Grammes, [name] 03/25/2021 2:45 PM Medical Record Number: [id number] Patient Account Number: [id number] Date of Birth/Sex: Treating RN: 09/18/1927 (87 y.o. Jesse [name]: Jesse [name]: Referring  [name]: Treating [name]/Extender: Jesse [name] in Treatment: 0 Constitutional Sitting or standing Blood Pressure is within target  range for patient.. Pulse regular and within target range for patient.Marland Kitchen Respirations regular, non-labored and within target range.. Temperature is normal and within the target range for the patient.Marland Kitchen Appears in no distress. Respiratory work of breathing is normal. Bilateral breath sounds are clear and equal in all lobes with no wheezes, rales or rhonchi.. Cardiovascular Heart rhythm and rate regular, without murmur or gallop.. Genitourinary (GU) Bladder is not distended. Electronic Signature(s) Signed: 03/25/2021 5:31:22 PM [name]: Jesse [name]: Jesse [name] on 03/25/2021 16:42:21 -------------------------------------------------------------------------------- Physician Orders Details Patient Name: Date of Service: Jesse Grammes, [name] 03/25/2021 2:45 PM Medical Record Number: [id number] Patient Account Number: [id number] Date of Birth/Sex: Treating RN: 1928-01-09 (87 y.o. Jesse [name] Primary Care [name]: Jesse [name] Other [name]: Referring [name]: Treating [name]/Extender: Jesse [name] in Treatment: 0 Verbal / Phone Orders: No Diagnosis Coding Follow-up Appointments ppointment in: - will call to schedule hyperbaric oxygen therapy Return A Hyperbaric Oxygen Therapy Indication: - radiation cystitis If appropriate for treatment, begin HBOT per protocol: 2.0 ATA for 90 Minutes without A Breaks ir Total Number of Treatments: - 40 One treatments per day (delivered Monday through Friday unless otherwise specified in Special Instructions below): A frin (Oxymetazoline HCL) 0.05% nasal spray - 1 spray in both nostrils daily as needed prior to HBO treatment for difficulty clearing ears Electronic Signature(s) Signed: 03/25/2021 5:16:40 PM [name]: Jesse [name] Signed: 03/25/2021  5:31:22 PM [name]: Jesse [name]: Jesse [name] on 03/25/2021 15:53:00 -------------------------------------------------------------------------------- Problem List Details Patient Name: Date of Service: Jesse Grammes, [name] 03/25/2021 2:45 PM Medical Record Number: [id number] Patient Account Number: [id number] Date of Birth/Sex: Treating RN: 04-Sep-1927 (87 y.o. Jesse [name]: Jesse [name]: Referring [name]: Treating [name]/Extender: Jesse [name] in Treatment: 0 Active Problems ICD-10 Encounter Code Description Active Date MDM Diagnosis N30.41 Irradiation cystitis with hematuria 03/25/2021 No Yes Z85.46 Personal history of malignant neoplasm of prostate 03/25/2021 No Yes Inactive Problems Resolved Problems Electronic Signature(s) Signed: 03/25/2021 5:31:22 PM [name]: Jesse [name]: Jesse [name] on 03/25/2021 16:31:47 -------------------------------------------------------------------------------- Progress Note Details Patient Name: Date of Service: Jesse Grammes, [name] 03/25/2021 2:45 PM Medical Record Number: [id number] Patient Account Number: [id number] Date of Birth/Sex: Treating RN: [birth year] (87 y.o. 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 We do not have a lot of details on this. The patient has been followed [name] Jesse [name] and since his retirement more recently [name] Jesse [name] at [facility] urology. Patient has a fairly long history of intermittent gross hematuria  being admitted to hospital in 09/04/2019 for hematuria and clot retention. In January 2021 he was taken to the operating room and underwent fulguration of bleeding within the prostatic urethra. The patient tells me that since then he intermittently has had episodes of gross hematuria which eventually gets better but he always has some visible blood in his urine. He has urinary frequency and incontinence. He was admitted to hospital from 01/22/2021 through 02/05/2021 with gross hematuria and and abdominal pain. A CT scan showed bladder distention he  had a cystoscopy. He had clot extraction. There was hypervascularity of the trigone. There was active bleeding from the bladder neck and prostatic urethra. 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 The bleeding stopped with continuous bladder irrigation and his Foley catheter was removed. Since then he has continued to have gross hematuria but more recently simply red urine. He had some discomfort after they remove the Foley catheter but he says this is getting better. He has been referred for consideration of hyperbaric oxygen therapy The patient has a history of coronary artery disease status post CABG. His last echocardiogram was in 2019 at which time the patient's ejection fraction was in the range of 40 to 45%. He does not have a description of chest pain or exertional shortness of breath. He seems to be fairly active. He also has a history of a thoracic aortic aneurysm, left 6th nerve palsy history of surgery for a small bowel obstruction on 12/12/2020, hernia repair, AAA repair and bilateral hip fractures. He has gastroesophageal reflux disease hyperlipidemia. The patient lives at [facility] which I gather is a combination of an assisted living with an independent living he is in the assisted living level Patient History Information obtained from Patient, Chart. Allergies Quinolones (Reaction: contraindication), adhesive (Reaction: itching,  rash) Family History Cancer - Mother,Father, No family history of Diabetes, Heart Disease, Hereditary Spherocytosis, Hypertension, Kidney Disease, Lung Disease, Seizures, Stroke, Thyroid Problems, Tuberculosis. Social History Former smoker - quit >52 years ago, Marital Status - Widowed, Alcohol Use - Never, Drug Use - No History, Caffeine Use - Never. Medical History Eyes Patient has history of Cataracts - bil removed Ear/Nose/Mouth/Throat Denies history of Chronic sinus problems/congestion, Middle ear problems Cardiovascular Patient has history of Arrhythmia - RBBB, Coronary Artery Disease, Hypertension, Myocardial Infarction Endocrine Denies history of Type I Diabetes, Type II Diabetes Genitourinary Denies history of End Stage Renal Disease Musculoskeletal Patient has history of Osteoarthritis Denies history of Gout, Rheumatoid Arthritis, Osteomyelitis Oncologic Patient has history of Received Radiation - 40 treatment Denies history of Received Chemotherapy Psychiatric Patient has history of Confinement Anxiety - mild Denies history of Anorexia/bulimia Hospitalization/Surgery History - AAA repair. - CABG 6 vessel. - detached retinal repair. - false aneurysm reapir. - hernia repair. - hot hemostasis. - left intradedullary nail intertrochantric. - ORIF patella. - repair of bowel obstruction. - ventral hernia repair. - resection femoral artery. - TURP. Medical A Surgical History Notes nd Eyes detached retina right eye Ear/Nose/Mouth/Throat hard of hearing Cardiovascular AAA, hyperlipidemia Gastrointestinal GERD, abdominal hernia Genitourinary hematuria, kidney stone Oncologic prostate cancer Review of Systems (ROS) Constitutional Symptoms (General Health) Denies complaints or symptoms of Fatigue, Fever, Chills, Marked Weight Change. Eyes Complains or has symptoms of Glasses / Contacts. Denies complaints or symptoms of Dry Eyes, Vision  Changes. Ear/Nose/Mouth/Throat Denies complaints or symptoms of Chronic sinus problems or rhinitis. Respiratory Denies complaints or symptoms of Chronic or frequent coughs, Shortness of Breath. Cardiovascular Denies complaints or symptoms of Chest pain. Gastrointestinal Denies complaints or symptoms of Frequent diarrhea, Nausea, Vomiting. Endocrine Denies complaints or symptoms of Heat/cold intolerance. Genitourinary Denies complaints or symptoms of Frequent urination. Integumentary (Skin) Denies complaints or symptoms of Wounds. Musculoskeletal Complains or has symptoms of Muscle Weakness. Denies complaints or symptoms of Muscle Pain. Neurologic Denies complaints or symptoms of Numbness/parasthesias. Psychiatric Denies complaints or symptoms of Claustrophobia, Suicidal. Objective Constitutional Sitting or standing Blood Pressure is within target range for patient.. Pulse regular and within target range for patient.Marland Kitchen Respirations regular, non-labored and within target  range.. Temperature is normal and within the target range for the patient.Marland Kitchen Appears in no distress. Vitals Time Taken: 3:06 PM, Height: 72 in, Source: Stated, Weight: 178 lbs, Source: Stated, BMI: 24.1, Temperature: 98.4 F, Pulse: 69 bpm, Respiratory Rate: 18 breaths/min, Blood Pressure: 110/67 mmHg. Respiratory work of breathing is normal. Bilateral breath sounds are clear and equal in all lobes with no wheezes, rales or rhonchi.. Cardiovascular Heart rhythm and rate regular, without murmur or gallop.. Genitourinary (GU) Bladder is not distended. Assessment Active Problems ICD-10 Irradiation cystitis with hematuria Personal history of malignant neoplasm of prostate Plan Follow-up Appointments: Return Appointment in: - will call to schedule hyperbaric oxygen therapy Hyperbaric Oxygen Therapy: Indication: - radiation cystitis If appropriate for treatment, begin HBOT per protocol: 2.0 ATA for 90 Minutes  without Air Breaks T Number of Treatments: - 40 otal One treatments per day (delivered Monday through Friday unless otherwise specified in Special Instructions below): Afrin (Oxymetazoline HCL) 0.05% nasal spray - 1 spray in both nostrils daily as needed prior to HBO treatment for difficulty clearing ears 1. Patient with a history of of radiation cystitis who is status post recent cystoscopy with clot extraction. This predominantly involve the bladder trigone and prostatic urethra. The rest of the bladder had no masses and no visible abnormalities 2. He would be a candidate for hyperbaric oxygen. Related to his age and cardiac function 2 atm for 40 treatments. No air breaks. 3. The patient has a history of coronary artery disease status post CABG in the 1990s. He does not describe exertional chest pain or exertional shortness of breath. He is capable of ambulating for short to moderate distances without limitation. His exam does not suggest congestive heart failure. I think he would be capable of attempting hyperbaric oxygen we would need to monitor him carefully as mentioned I would only go to 2 atm. He would need a chest x-ray and a recent EKG. I do not see a recent cardiac note even with his small bowel obstruction in May of this year 4. Truthfully the major issue here may be transportation. He lives in a facility at their assisted living level [facility]] whether they will transport him 5 days a week here and back I am uncertain I spent 45 minutes in review of this patient's past medical history, face-to-face evaluation and preparation of this record Electronic Signature(s) Signed: 03/25/2021 5:31:22 PM [name]: Jesse [name]: Jesse [name] on 03/25/2021 16:50:10 -------------------------------------------------------------------------------- HxROS Details Patient Name: Date of Service: [name], [name] 03/25/2021 2:45 PM Medical Record Number: [id number] Patient Account  Number: [id number] Date of Birth/Sex: Treating RN: [birth year] (87 y.o. Jesse [name] Primary Care [name]: Other [name]: Jesse [name] Referring [name]: Treating [name]/Extender: Jesse [name] in Treatment: 0 Information Obtained From Patient Chart Constitutional Symptoms (General Health) Complaints and Symptoms: Negative for: Fatigue; Fever; Chills; Marked Weight Change Eyes Complaints and Symptoms: Positive for: Glasses / Contacts Negative for: Dry Eyes; Vision Changes Medical History: Positive for: Cataracts - bil removed Past Medical History Notes: detached retina right eye Ear/Nose/Mouth/Throat Complaints and Symptoms: Negative for: Chronic sinus problems or rhinitis Medical History: Negative for: Chronic sinus problems/congestion; Middle ear problems Past Medical History Notes: hard of hearing Respiratory Complaints and Symptoms: Negative for: Chronic or frequent coughs; Shortness of Breath Cardiovascular Complaints and Symptoms: Negative for: Chest pain Medical History: Positive for: Arrhythmia - RBBB; Coronary Artery Disease; Hypertension; Myocardial Infarction Past Medical History Notes: AAA, hyperlipidemia Gastrointestinal Complaints and  Symptoms: Negative for: Frequent diarrhea; Nausea; Vomiting Medical History: Past Medical History Notes: GERD, abdominal hernia Endocrine Complaints and Symptoms: Negative for: Heat/cold intolerance Medical History: Negative for: Type I Diabetes; Type II Diabetes Genitourinary Complaints and Symptoms: Negative for: Frequent urination Medical History: Negative for: End Stage Renal Disease Past Medical History Notes: hematuria, kidney stone Integumentary (Skin) Complaints and Symptoms: Negative for: Wounds Musculoskeletal Complaints and Symptoms: Positive for: Muscle Weakness Negative for: Muscle Pain Medical History: Positive for: Osteoarthritis Negative for: Gout;  Rheumatoid Arthritis; Osteomyelitis Neurologic Complaints and Symptoms: Negative for: Numbness/parasthesias Psychiatric Complaints and Symptoms: Negative for: Claustrophobia; Suicidal Medical History: Positive for: Confinement Anxiety - mild Negative for: Anorexia/bulimia Hematologic/Lymphatic Immunological Oncologic Medical History: Positive for: Received Radiation - 40 treatment Negative for: Received Chemotherapy Past Medical History Notes: prostate cancer HBO Extended History Items Eyes: Cataracts Immunizations Pneumococcal Vaccine: Received Pneumococcal Vaccination: Yes Received Pneumococcal Vaccination On or After 60th Birthday: Yes Implantable Devices No devices added Hospitalization / Surgery History Type of Hospitalization/Surgery AAA repair CABG 6 vessel detached retinal repair false aneurysm reapir hernia repair hot hemostasis left intradedullary nail intertrochantric ORIF patella repair of bowel obstruction ventral hernia repair resection femoral artery TURP Family and Social History Cancer: Yes - Mother,Father; Diabetes: No; Heart Disease: No; Hereditary Spherocytosis: No; Hypertension: No; Kidney Disease: No; Lung Disease: No; Seizures: No; Stroke: No; Thyroid Problems: No; Tuberculosis: No; Former smoker - quit >52 years ago; Marital Status - Widowed; Alcohol Use: Never; Drug Use: No History; Caffeine Use: Never; Financial Concerns: No; Food, Clothing or Shelter Needs: No; Support System Lacking: No; Transportation Concerns: No Electronic Signature(s) Signed: 03/25/2021 5:16:40 PM [name]: Jesse [name] Signed: 03/25/2021 5:31:22 PM [name]: Jesse [name]: Jesse [name] on 03/25/2021 15:24:54 -------------------------------------------------------------------------------- SuperBill Details Patient Name: Date of Service: Jesse Grammes, [name] 03/25/2021 Medical Record Number: [id number] Patient Account Number: [id number] Date of  Birth/Sex: Treating RN: [birth year] (87 y.o. Jesse [name] Primary Care [name]: Jesse [name] Other [name]: Referring [name]: Treating [name]/Extender: Jesse [name] in Treatment: 0 Diagnosis Coding ICD-10 Codes Code Description N30.41 Irradiation cystitis with hematuria Z85.46 Personal history of malignant neoplasm of prostate Facility Procedures CPT4 Code: [id number] Description: 22025 - WOUND CARE VISIT-LEV 3 EST PT Modifier: Quantity: 1 Physician Procedures : CPT4 Code Description Modifier [id number] - WC PHYS LEVEL 4 - NEW PT ICD-10 Diagnosis Description N30.41 Irradiation cystitis with hematuria Z85.46 Personal history of malignant neoplasm of prostate Quantity: 1 Electronic Signature(s) Signed: 03/25/2021 5:31:22 PM [name]: Jesse [name]: Jesse [name] on 03/25/2021 16:50:28

## 2021-03-25 NOTE — Progress Notes (Signed)
SCOT, ENSOR ([id number]) Visit Report for 03/25/2021 Abuse/Suicide Risk Screen Details Patient Name: Date of Service: Jesse [name] 03/25/2021 2:45 PM Medical Record Number: [id number] Patient Account Number: [id number] Date of Birth/Sex: Treating RN: [birth year] (87 y.o. Jesse [name] Primary Care Jesse [name]: Jesse [name] Other Clinician: Referring Jesse [name]: Treating Jesse [name]/Extender: Jesse [name] in Treatment: 0 Abuse/Suicide Risk Screen Items Answer ABUSE RISK SCREEN: Has anyone close to you tried to hurt or harm you recentlyo No Do you feel uncomfortable with anyone in your familyo No Has anyone forced you do things that you didnt want to doo No Electronic Signature(s) Signed: 03/25/2021 5:16:40 PM By: Baruch Gouty RN, BSN Entered By: Baruch Gouty on 03/25/2021 15:27:36 -------------------------------------------------------------------------------- Activities of Daily Living Details Patient Name: Date of Service: Jesse [name]. 03/25/2021 2:45 PM Medical Record Number: [id number] Patient Account Number: [id number] Date of Birth/Sex: Treating RN: Aug 19, 1927 (87 y.o. Jesse [name] Primary Care [name]: Jesse [name] Other Clinician: Referring Jesse [name]: Treating Jesse [name]/Extender: Jesse [name] in Treatment: 0 Activities of Daily Living Items Answer Activities of Daily Living (Please select one for each item) Drive Automobile Not Able T Medications ake Need Assistance Use T elephone Completely Able Care for Appearance Completely Able Use T oilet Completely Able Bath / Shower Completely Able Dress Self Completely Able Feed Self Completely Able Walk Need Assistance Get In / Out Bed Completely [place] Need Assistance Shop for Self Need Assistance Electronic Signature(s) Signed: 03/25/2021 5:16:40 PM By: Baruch Gouty RN, BSN Entered By:  Baruch Gouty on 03/25/2021 15:28:55 -------------------------------------------------------------------------------- Education Screening Details Patient Name: Date of Service: Jesse RPE, [name] 03/25/2021 2:45 PM Medical Record Number: [id number] Patient Account Number: [id number] Date of Birth/Sex: Treating RN: 09-03-1927 (87 y.o. Jesse [name] Primary Care [name]: Jesse [name] Other Clinician: Referring Jesse [name]: Treating Jesse [name]/Extender: Jesse [name] in Treatment: 0 Primary Learner Assessed: Patient Learning Preferences/Education Level/Primary Language Learning Preference: Explanation, Demonstration, Printed Material Highest Education Level: College or Above Preferred Language: English Cognitive Barrier Language Barrier: No Translator Needed: No Memory Deficit: No Emotional Barrier: No Cultural/Religious Beliefs Affecting Medical Care: No Physical Barrier Impaired Vision: Yes Glasses Impaired Hearing: Yes hard of hearing Decreased Hand dexterity: No Knowledge/Comprehension Knowledge Level: High Comprehension Level: High Ability to understand written instructions: High Ability to understand verbal instructions: High Motivation Anxiety Level: Calm Cooperation: Cooperative Education Importance: Acknowledges Need Interest in Health Problems: Asks Questions Perception: Coherent Willingness to Engage in Self-Management High Activities: Readiness to Engage in Self-Management High Activities: Electronic Signature(s) Signed: 03/25/2021 5:16:40 PM By: Baruch Gouty RN, BSN Entered By: Baruch Gouty on 03/25/2021 15:30:06 -------------------------------------------------------------------------------- Fall Risk Assessment Details Patient Name: Date of Service: Jesse RPE, [name] 03/25/2021 2:45 PM Medical Record Number: [id number] Patient Account Number: [id number] Date of Birth/Sex: Treating RN: [birth year] (87 y.o. Jesse [name] Primary Care Jesse [name]: Jesse [name] Other Clinician: Referring Jesse [name]: Treating Jesse [name]/Extender: Jesse [name] in Treatment: 0 Fall Risk Assessment Items Have you had 2 or more falls in the last 12 monthso 0 Yes Have you had any fall that resulted in injury in the last 12 monthso 0 Yes FALLS RISK SCREEN History of falling - immediate or within 3 months 25 Yes Secondary diagnosis (Do you have 2 or more medical diagnoseso) 0 No Ambulatory aid None/bed rest/wheelchair/nurse 0 No Crutches/cane/walker 15 Yes Furniture 0 No  Intravenous therapy Access/Saline/Heparin Lock 0 No Gait/Transferring Normal/ bed rest/ wheelchair 0 No Weak (short steps with or without shuffle, stooped but able to lift head while walking, may seek 10 Yes support from furniture) Impaired (short steps with shuffle, may have difficulty arising from chair, head down, impaired 0 No balance) Mental Status Oriented to own ability 0 Yes Electronic Signature(s) Signed: 03/25/2021 5:16:40 PM By: Baruch Gouty RN, BSN Entered By: Baruch Gouty on 03/25/2021 15:30:23 -------------------------------------------------------------------------------- Foot Assessment Details Patient Name: Date of Service: Jesse Grammes, [name] 03/25/2021 2:45 PM Medical Record Number: [id number] Patient Account Number: [id number] Date of Birth/Sex: Treating RN: 1927-11-24 (87 y.o. Jesse [name] Primary Care Jesse [name]: Jesse [name] Other Clinician: Referring Jesse [name]: Treating Jesse [name]/Extender: Jesse [name] in Treatment: 0 Foot Assessment Items Site Locations + = Sensation present, - = Sensation absent, C = Callus, U = Ulcer R = Redness, W = Warmth, M = Maceration, PU = Pre-ulcerative lesion F = Fissure, S = Swelling, D = Dryness Assessment Right: Left: Other Deformity: No No Prior Foot Ulcer: No No Prior Amputation: No No Charcot Joint: No No Ambulatory Status:  Ambulatory With Help Assistance Device: Cane Gait: Steady Electronic Signature(s) Signed: 03/25/2021 5:16:40 PM By: Baruch Gouty RN, BSN Entered By: Baruch Gouty on 03/25/2021 15:31:10 -------------------------------------------------------------------------------- Nutrition Risk Screening Details Patient Name: Date of Service: Jesse RPE, [name] 03/25/2021 2:45 PM Medical Record Number: [id number] Patient Account Number: [id number] Date of Birth/Sex: Treating RN: 11-10-1927 (87 y.o. Jesse [name] Primary Care Onur Mori: Jesse [name] Other Clinician: Referring Kaleiah Kutzer: Treating Jazzy Parmer/Extender: Jesse [name] in Treatment: 0 Height (in): 72 Weight (lbs): 178 Body Mass Index (BMI): 24.1 Nutrition Risk Screening Items Score Screening NUTRITION RISK SCREEN: I have an illness or condition that made me change the kind and/or amount of food I eat 0 No I eat fewer than two meals per day 0 No I eat few fruits and vegetables, or milk products 0 No I have three or more drinks of beer, liquor or wine almost every day 0 No I have tooth or mouth problems that make it hard for me to eat 0 No I don't always have enough money to buy the food I need 0 No I eat alone most of the time 0 No I take three or more different prescribed or over-the-counter drugs a day 1 Yes Without wanting to, I have lost or gained 10 pounds in the last six months 0 No I am not always physically able to shop, cook and/or feed myself 0 No Nutrition Protocols Good Risk Protocol 0 No interventions needed Moderate Risk Protocol High Risk Proctocol Risk Level: Good Risk Score: 1 Electronic Signature(s) Signed: 03/25/2021 5:16:40 PM By: Baruch Gouty RN, BSN Entered By: Baruch Gouty on 03/25/2021 15:30:55

## 2021-03-25 NOTE — Progress Notes (Addendum)
GIORDANO, URSINO ([id number]) Visit Report for 03/25/2021 Allergy List Details Patient Name: Date of Service: [place], [name] 03/25/2021 2:45 PM Medical Record Number: [id number] Patient Account Number: [id number] Date of Birth/Sex: Treating RN: 1927/11/09 (87 y.o. [name] Primary Care Nygel Prokop: [name] Other Clinician: Referring Chayanne Filippi: Treating Duy Lemming/Extender: [name] in Treatment: 0 Allergies Active Allergies Quinolones Reaction: contraindication adhesive Reaction: itching, rash Allergy Notes Electronic Signature(s) Signed: 03/25/2021 5:16:40 PM By: Baruch Gouty RN, BSN Entered By: Baruch Gouty on 03/25/2021 15:10:02 -------------------------------------------------------------------------------- Arrival Information Details Patient Name: Date of Service: [name], [name] 03/25/2021 2:45 PM Medical Record Number: [id number] Patient Account Number: [id number] Date of Birth/Sex: Treating RN: Dec 02, 1927 (87 y.o. [name] Primary Care Catrice Zuleta: [name] Other Clinician: Referring Lesly Pontarelli: Treating Tosca Pletz/Extender: [name] in Treatment: 0 Visit Information Patient Arrived: Lyndel Pleasure Time: 15:05 Accompanied By: self Transfer Assistance: None Patient Identification Verified: Yes Secondary Verification Process Completed: Yes Patient Requires Transmission-Based Precautions: No Patient Has Alerts: No Electronic Signature(s) Signed: 03/25/2021 5:16:40 PM By: Baruch Gouty RN, BSN Entered By: Baruch Gouty on 03/25/2021 15:05:43 -------------------------------------------------------------------------------- Clinic Level of Care Assessment Details Patient Name: Date of Service: [facility] RPE, [place] X W. 03/25/2021 2:45 PM Medical Record Number: [id number] Patient Account Number: [id number] Date of Birth/Sex: Treating RN: January 22, 1928 (87 y.o. [name] Primary Care Devorah Givhan: [name] Other Clinician: Referring Douglas Smolinsky: Treating Kayton Dunaj/Extender: [name] in Treatment: 0 Clinic Level of Care Assessment Items TOOL 2 Quantity Score '[]'$  - 0 Use when only an EandM is performed on the INITIAL visit ASSESSMENTS - Nursing Assessment / Reassessment X- 1 20 General Physical Exam (combine w/ comprehensive assessment (listed just below) when performed on new pt. evals) X- 1 25 Comprehensive Assessment (HX, ROS, Risk Assessments, Wounds Hx, etc.) ASSESSMENTS - Wound and Skin A ssessment / Reassessment '[]'$  - 0 Simple Wound Assessment / Reassessment - one wound '[]'$  - 0 Complex Wound Assessment / Reassessment - multiple wounds '[]'$  - 0 Dermatologic / Skin Assessment (not related to wound area) ASSESSMENTS - Ostomy and/or Continence Assessment and Care '[]'$  - 0 Incontinence Assessment and Management '[]'$  - 0 Ostomy Care Assessment and Management (repouching, etc.) PROCESS - Coordination of Care X - Simple Patient / Family Education for ongoing care 1 15 '[]'$  - 0 Complex (extensive) Patient / Family Education for ongoing care X- 1 10 Staff obtains Programmer, systems, Records, T Results / Process Orders est '[]'$  - 0 Staff telephones HHA, Nursing Homes / Clarify orders / etc '[]'$  - 0 Routine Transfer to another Facility (non-emergent condition) '[]'$  - 0 Routine Hospital Admission (non-emergent condition) X- 1 15 New Admissions / Biomedical engineer / Ordering NPWT Apligraf, etc. , '[]'$  - 0 Emergency Hospital Admission (emergent condition) X- 1 10 Simple Discharge Coordination '[]'$  - 0 Complex (extensive) Discharge Coordination PROCESS - Special Needs '[]'$  - 0 Pediatric / Minor Patient Management '[]'$  - 0 Isolation Patient Management '[]'$  - 0 Hearing / Language / Visual special needs '[]'$  - 0 Assessment of Community assistance (transportation, D/C planning, etc.) '[]'$  - 0 Additional assistance / Altered mentation '[]'$  - 0 Support Surface(s) Assessment (bed,  cushion, seat, etc.) INTERVENTIONS - Wound Cleansing / Measurement '[]'$  - 0 Wound Imaging (photographs - any number of wounds) '[]'$  - 0 Wound Tracing (instead of photographs) '[]'$  - 0 Simple Wound Measurement - one wound '[]'$  - 0 Complex Wound Measurement - multiple wounds '[]'$  -  0 Simple Wound Cleansing - one wound '[]'$  - 0 Complex Wound Cleansing - multiple wounds INTERVENTIONS - Wound Dressings '[]'$  - 0 Small Wound Dressing one or multiple wounds '[]'$  - 0 Medium Wound Dressing one or multiple wounds '[]'$  - 0 Large Wound Dressing one or multiple wounds '[]'$  - 0 Application of Medications - injection INTERVENTIONS - Miscellaneous '[]'$  - 0 External ear exam '[]'$  - 0 Specimen Collection (cultures, biopsies, blood, body fluids, etc.) '[]'$  - 0 Specimen(s) / Culture(s) sent or taken to Lab for analysis '[]'$  - 0 Patient Transfer (multiple staff / Harrel Lemon Lift / Similar devices) '[]'$  - 0 Simple Staple / Suture removal (25 or less) '[]'$  - 0 Complex Staple / Suture removal (26 or more) '[]'$  - 0 Hypo / Hyperglycemic Management (close monitor of Blood Glucose) '[]'$  - 0 Ankle / Brachial Index (ABI) - do not check if billed separately Has the patient been seen at the hospital within the last three years: Yes Total Score: 95 Level Of Care: New/Established - Level 3 Electronic Signature(s) Signed: 03/25/2021 5:16:40 PM By: Baruch Gouty RN, BSN Entered By: Baruch Gouty on 03/25/2021 16:41:56 -------------------------------------------------------------------------------- Lower Extremity Assessment Details Patient Name: Date of Service: [facility] RPE, [name] 03/25/2021 2:45 PM Medical Record Number: [id number] Patient Account Number: [id number] Date of Birth/Sex: Treating RN: 19-Aug-1927 (87 y.o. [name] Primary Care Elgin Carn: [name] Other Clinician: Referring Dejan Angert: Treating Anyla Israelson/Extender: [name] in Treatment: 0 Electronic Signature(s) Signed: 03/25/2021 5:16:40  PM By: Baruch Gouty RN, BSN Entered By: Baruch Gouty on 03/25/2021 15:31:17 -------------------------------------------------------------------------------- Multi Wound Chart Details Patient Name: Date of Service: [name], [name] 03/25/2021 2:45 PM Medical Record Number: [id number] Patient Account Number: [id number] Date of Birth/Sex: Treating RN: [birth year] (87 y.o. [name] Primary Care Jayin Derousse: [name] Other Clinician: Referring Marcel Gary: Treating Aerin Delany/Extender: [name] in Treatment: 0 Vital Signs Height(in): 72 Pulse(bpm): 69 Weight(lbs): 178 Blood Pressure(mmHg): 110/67 Body Mass Index(BMI): 24 Temperature(F): 98.4 Respiratory Rate(breaths/min): 18 Wound Assessments Treatment Notes Electronic Signature(s) Signed: 03/25/2021 5:31:22 PM By: Linton Ham MD Signed: 03/25/2021 5:36:38 PM By: Deon Pilling Entered By: Linton Ham on 03/25/2021 16:31:53 -------------------------------------------------------------------------------- Multi-Disciplinary Care Plan Details Patient Name: Date of Service: [facility], [place] Thermon Leyland. 03/25/2021 2:45 PM Medical Record Number: [id number] Patient Account Number: [id number] Date of Birth/Sex: Treating RN: [birth year] (87 y.o. [name] Primary Care Meriem Lemieux: [name] Other Clinician: Referring Jalexia Lalli: Treating Sahmya Arai/Extender: [name] in Treatment: 0 Multidisciplinary Care Plan reviewed with physician Active Inactive Electronic Signature(s) Signed: 04/22/2021 6:02:00 PM By: Baruch Gouty RN, BSN Previous Signature: 03/25/2021 5:16:40 PM Version By: Baruch Gouty RN, BSN Entered By: Baruch Gouty on 04/22/2021 10:34:44 -------------------------------------------------------------------------------- Pain Assessment Details Patient Name: Date of Service: [name], [name] 03/25/2021 2:45 PM Medical Record Number: [id number] Patient Account  Number: [id number] Date of Birth/Sex: Treating RN: 1928/04/16 (87 y.o. [name] Primary Care Trayce Caravello: [name] Other Clinician: Referring Kitai Purdom: Treating Kaye Mitro/Extender: [name] in Treatment: 0 Active Problems Location of Pain Severity and Description of Pain Patient Has Paino No Site Locations Rate the pain. Rate the pain. Current Pain Level: 1 Character of Pain Describe the Pain: Other: sore, irritating Pain Management and Medication Current Pain Management: Electronic Signature(s) Signed: 03/25/2021 5:16:40 PM By: Baruch Gouty RN, BSN Entered By: Baruch Gouty on 03/25/2021 15:32:51 -------------------------------------------------------------------------------- Patient/Caregiver Education Details Patient Name: Date of Service:  [name], [name] 9/1/2022andnbsp2:45 PM Medical Record Number: [id number] Patient Account Number: [id number] Date of Birth/Gender: Treating RN: 08/16/1927 (87 y.o. [name] Primary Care Physician: [name] Other Clinician: Referring Physician: Treating Physician/Extender: [name] in Treatment: 0 Education Assessment Education Provided To: Patient Education Topics Provided Hyperbaric Oxygenation: Handouts: Hyperbaric Oxygen Methods: Explain/Verbal Responses: Reinforcements needed, State content correctly Electronic Signature(s) Signed: 03/25/2021 5:16:40 PM By: Baruch Gouty RN, BSN Entered By: Baruch Gouty on 03/25/2021 15:55:55 -------------------------------------------------------------------------------- Vitals Details Patient Name: Date of Service: [name], [name] 03/25/2021 2:45 PM Medical Record Number: [id number] Patient Account Number: [id number] Date of Birth/Sex: Treating RN: 08-05-1927 (87 y.o. [name] Primary Care Josefa [place]: [name] Other Clinician: Referring Frederico Gerling: Treating Kieli Golladay/Extender: [name] in Treatment: 0 Vital Signs Time Taken: 15:06 Temperature (F): 98.4 Height (in): 72 Pulse (bpm): 69 Source: Stated Respiratory Rate (breaths/min): 18 Weight (lbs): 178 Blood Pressure (mmHg): 110/67 Source: Stated Reference Range: 80 - 120 mg / dl Body Mass Index (BMI): 24.1 Electronic Signature(s) Signed: 03/25/2021 5:16:40 PM By: Baruch Gouty RN, BSN Entered By: Baruch Gouty on 03/25/2021 15:08:08

## 2021-03-29 DIAGNOSIS — R278 Other lack of coordination: Secondary | ICD-10-CM | POA: Diagnosis not present

## 2021-04-05 ENCOUNTER — Other Ambulatory Visit: Payer: Medicare Other

## 2021-04-05 ENCOUNTER — Ambulatory Visit: Payer: Medicare Other

## 2021-04-08 ENCOUNTER — Encounter (HOSPITAL_BASED_OUTPATIENT_CLINIC_OR_DEPARTMENT_OTHER): Payer: Medicare Other | Admitting: Internal Medicine

## 2021-04-21 DIAGNOSIS — F5101 Primary insomnia: Secondary | ICD-10-CM | POA: Diagnosis not present

## 2021-04-21 DIAGNOSIS — F32A Depression, unspecified: Secondary | ICD-10-CM | POA: Diagnosis not present

## 2021-04-21 DIAGNOSIS — F413 Other mixed anxiety disorders: Secondary | ICD-10-CM | POA: Diagnosis not present

## 2021-04-22 DIAGNOSIS — D638 Anemia in other chronic diseases classified elsewhere: Secondary | ICD-10-CM | POA: Diagnosis not present

## 2021-04-22 DIAGNOSIS — I25810 Atherosclerosis of coronary artery bypass graft(s) without angina pectoris: Secondary | ICD-10-CM | POA: Diagnosis not present

## 2021-04-22 DIAGNOSIS — I10 Essential (primary) hypertension: Secondary | ICD-10-CM | POA: Diagnosis not present

## 2021-04-22 DIAGNOSIS — K219 Gastro-esophageal reflux disease without esophagitis: Secondary | ICD-10-CM | POA: Diagnosis not present

## 2021-04-27 ENCOUNTER — Other Ambulatory Visit: Payer: Self-pay

## 2021-04-27 ENCOUNTER — Ambulatory Visit: Payer: Medicare Other | Admitting: Physician Assistant

## 2021-04-27 ENCOUNTER — Ambulatory Visit
Admission: RE | Admit: 2021-04-27 | Discharge: 2021-04-27 | Disposition: A | Discharge status: Home / Self Care | Payer: Medicare Other | Source: Ambulatory Visit | Attending: Surgery | Admitting: Surgery

## 2021-04-27 VITALS — BP 115/69 | HR 67 | Resp 20 | Ht 72.0 in | Wt 169.0 lb

## 2021-04-27 DIAGNOSIS — B351 Tinea unguium: Secondary | ICD-10-CM | POA: Diagnosis not present

## 2021-04-27 DIAGNOSIS — I739 Peripheral vascular disease, unspecified: Secondary | ICD-10-CM | POA: Diagnosis not present

## 2021-04-27 DIAGNOSIS — I7121 Aneurysm of the ascending aorta, without rupture: Secondary | ICD-10-CM

## 2021-04-27 DIAGNOSIS — R911 Solitary pulmonary nodule: Secondary | ICD-10-CM | POA: Diagnosis not present

## 2021-04-27 DIAGNOSIS — I712 Thoracic aortic aneurysm, without rupture, unspecified: Secondary | ICD-10-CM

## 2021-04-27 DIAGNOSIS — M79674 Pain in right toe(s): Secondary | ICD-10-CM | POA: Diagnosis not present

## 2021-04-27 DIAGNOSIS — M2041 Other hammer toe(s) (acquired), right foot: Secondary | ICD-10-CM | POA: Diagnosis not present

## 2021-04-27 MED ORDER — IOPAMIDOL (ISOVUE-370) INJECTION 76%
75.0000 mL | Freq: Once | INTRAVENOUS | Status: AC | PRN
  Administered 2021-04-27: 75 mL via INTRAVENOUS

## 2021-04-27 NOTE — Patient Instructions (Addendum)
He was cautioned about strenuous lifting or the use of fluoroquinolone antibiotics.  Cipro  Levaquin   Patient notes that in several weeks he will be 87 years old, but he would like to come back in 1 year for follow-up.

## 2021-04-27 NOTE — Progress Notes (Signed)
[address]       [place] [address]             [phone]                    Nathaneil W Lama Donnelly Medical Record #[id number] Date of Birth: 09-06-1927  Referring: Early, [name], MD Primary Care: [name], MD Primary cardiology: [name], MD  Chief Complaint:    Chief Complaint  Patient presents with   Thoracic Aortic Aneurysm    1 year f/u with Chest CTA    History of Present Illness:     Jesse [name] 87 y.o. male is seen in the office for follow-up of of  thoracic aneurysm. He has been followed by Dr Early  for abdominal aneurysm disease known since 1992 when he had CABG by me.   He had open abdominal aneurysm repair and bilateral femoral  aneurysm repair  In 1995       In the fall 2019 the patient developed a 5 unit GI bleed, transiently he was off aspirin but currently on 81 mg a day     He returns today with a follow-up CTA of the chest.  He has not wanted to have major cardiac surgery redo but has expressed a desire to know if his aneurysmal disease is getting worse.  He notes that he has fallen several times recently breaking both of his legs, is now recovering in a extended care facility.  He had prostate cancer and had some residual bleeding from that. His biggest issue these days is incontinence and he is uncomfortable because of this. He sees a urologist and has follow-up in place.   He has not had any new pain or shortness of breath.    Current Activity/ Functional Status:  Patient is independent with mobility/ambulation, transfers, ADL's, IADL's.   Zubrod Score: At the time of surgery this patient's most appropriate activity status/level should be described as: []     0    Normal activity, no symptoms [x]     1    Restricted in physical strenuous activity but ambulatory, able to do out light work []     2    Ambulatory and capable of self care, unable to do work activities, up and about               >50 % of waking hours                               []     3    Only limited self care, in bed greater than 50% of waking hours []     4    Completely disabled, no self care, confined to bed or chair []     5    Moribund   Past Medical History:  Diagnosis Date   AAA (abdominal aortic aneurysm) ([place])    Arthritis    Cancer ([place]) 2010   prostate ( 40 Txs. of radiation treatments)   Coronary artery disease    Environmental allergies    GERD (gastroesophageal reflux disease)    History of prostate cancer 2010   HOH (hard of hearing)    wears bilateral hearing aids   Hyperlipidemia    Hypertension    Kidney stone 1968   Myocardial infarction ([place]) 1992   Pneumonia    hx of   RBBB  Past Surgical History:  Procedure Laterality Date   ABDOMINAL AORTIC ANEURYSM REPAIR  08-13-1993   Dr Cameron Sprang   CARDIAC CATHETERIZATION     COLONOSCOPY W/ POLYPECTOMY     CORONARY ARTERY BYPASS GRAFT  03-01-1991   Dr. Servando Snare   ESOPHAGOGASTRODUODENOSCOPY (EGD) WITH PROPOFOL N/A 05/04/2018   Procedure: ESOPHAGOGASTRODUODENOSCOPY (EGD) WITH PROPOFOL;  Surgeon: [name], MD;  Location: [facility];  Service: Gastroenterology;  Laterality: N/A;   EYE SURGERY     Detached retina (Left eye); bilateral cataract removal   FALSE ANEURYSM REPAIR  05/07/2012   Procedure: REPAIR FALSE ANEURYSM;  Surgeon: Rosetta Posner, MD;  Location: [facility] OR;  Service: Vascular;  Laterality: Left;  Repair of Left Femoral Artery Aneurysm   HERNIA REPAIR     HOT HEMOSTASIS N/A 05/04/2018   Procedure: HOT HEMOSTASIS (ARGON PLASMA COAGULATION/BICAP);  Surgeon: [name], MD;  Location: [facility];  Service: Gastroenterology;  Laterality: N/A;   INTRAMEDULLARY (IM) NAIL INTERTROCHANTERIC Left 04/16/2019   Procedure: INTRAMEDULLARY (IM) NAIL INTERTROCHANTRIC;  Surgeon: Rod Can, MD;  Location: [facility];  Service: Orthopedics;  Laterality: Left;   INTRAMEDULLARY (IM) NAIL INTERTROCHANTERIC Right 06/17/2019   Procedure: RIGHT INTRAMEDULLARY  (IM) NAIL INTERTROCHANTRIC;  Surgeon: Rod Can, MD;  Location: [facility];  Service: Orthopedics;  Laterality: Right;   ORIF PATELLA Right 06/09/2015   Procedure: OPEN REDUCTION INTERNAL (ORIF) FIXATION PATELLA;  Surgeon: Marybelle Killings, MD;  Location: [facility];  Service: Orthopedics;  Laterality: Right;   repair of bowel obstruction  1999   Dr. Dalbert Batman   repair of ventral hernia  04-20-1994   P. Cameron Sprang MD   resection femoral artery  03/19/2012   Dr. [name] Early   [place] INJECTION  05/04/2018   Procedure: SUBMUCOSAL INJECTION;  Surgeon: [name], MD;  Location: [facility];  Service: Gastroenterology;;   TRANSURETHRAL RESECTION OF BLADDER TUMOR N/A 08/18/2019   Procedure: cysto fulgeration clot evacuation;  Surgeon: Cleon Gustin, MD;  Location: [facility];  Service: Urology;  Laterality: N/A;    Family History  Problem Relation Age of Onset   Cancer Mother    Cancer Father    Varicose Veins Father    Cancer Sister    Diabetes Sister    Heart attack Sister     Social History   Socioeconomic History   Marital status: Widowed    Spouse name: Not on file   Number of children: Not on file   Years of education: Not on file   Highest education level: Not on file  Occupational History   Not on file  Tobacco Use   Smoking status: Former Smoker    Years: 20.00    Types: Cigarettes    Quit date: 07/25/1968    Years since quitting: 51.7   Smokeless tobacco: Never Used  Vaping Use   Vaping Use: Never used  Substance and Sexual Activity   Alcohol use: No   Drug use: No   Sexual activity: Not on file  Other Topics Concern   Not on file  Social History Narrative   Not on file   Social Determinants of Health    Social History   Tobacco Use  Smoking Status Former Smoker   Years: 20.00   Types: Cigarettes   Quit date: 07/25/1968   Years since quitting: 51.7  Smokeless Tobacco Never Used    Social History   Substance and Sexual Activity  Alcohol Use No      Allergies  Allergen Reactions  Quinolones Other (See Comments)    unknown   Adhesive [Tape] Itching and Rash    Current Outpatient Medications  Medication Sig Dispense Refill   acetaminophen (TYLENOL) 500 MG tablet Take 500 mg by mouth every 6 (six) hours as needed for mild pain or headache.     alendronate (FOSAMAX) 70 MG tablet Take 70 mg by mouth once a week. Saturday     aspirin 81 MG tablet Take 1 tablet (81 mg total) by mouth daily. 30 tablet    carboxymethylcellulose (REFRESH PLUS) 0.5 % SOLN Apply 2 drops to eye at bedtime.      carvedilol (COREG) 12.5 MG tablet TAKE 1 TABLET BY MOUTH 2 TIMES DAILY WITH A MEAL (Patient taking differently: Take 12.5 mg by mouth 2 (two) times daily with a meal. ) 180 tablet 1   Cyanocobalamin (B-12 PO) Take 1,000 mcg by mouth daily.      docusate sodium (COLACE) 100 MG capsule Take 2 capsules (200 mg total) by mouth 2 (two) times daily. 10 capsule 0   fenofibrate 160 MG tablet Take 1 tablet (160 mg total) by mouth daily. 90 tablet 1   ferrous sulfate 325 (65 FE) MG tablet Take 325 mg by mouth daily with breakfast.     finasteride (PROSCAR) 5 MG tablet Take 5 mg by mouth daily.     loratadine (CLARITIN) 10 MG tablet Take 10 mg by mouth daily.      Melatonin 3 MG TABS Take 3 mg by mouth at bedtime as needed (sleep).     pantoprazole (PROTONIX) 40 MG tablet Take 40 mg by mouth daily.     pyridostigmine (MESTINON) 60 MG tablet Take 0.5 tablets (30 mg total) by mouth daily after breakfast. 3 tablet 0   sertraline (ZOLOFT) 25 MG tablet Take 25 mg by mouth daily.     simvastatin (ZOCOR) 40 MG tablet Take 1 tablet (40 mg total) by mouth daily at 6 PM. 90 tablet 1   sodium chloride (OCEAN) 0.65 % SOLN nasal spray Place 2 sprays into both nostrils 2 (two) times daily.     tamsulosin (FLOMAX) 0.4 MG CAPS capsule Take 0.4 mg by mouth daily.     No current facility-administered medications for this visit.     Physical Exam: Vitals:   04/27/21 1329   BP: 115/69  Pulse: 67  Resp: 20  SpO2: 97%     PHYSICAL EXAMINATION:  General appearance: alert, cooperative and no distress Cardio: regular rate and rhythm, S1, S2 normal, no murmur, click, rub or gallop Pulm: CTA bilaterally and in all fields GI: soft, non-tender; bowel sounds normal; no masses,  no organomegaly Extremities: left sided edema (EVH leg), this has been present for about 5 years Neurologic: Grossly normal  Diagnostic Studies & Laboratory data:   CLINICAL DATA:  A 87 year old male presents for follow-up of a thoracic aortic aneurysm.   EXAM: CT ANGIOGRAPHY CHEST WITH CONTRAST   TECHNIQUE: Multidetector CT imaging of the chest was performed using the standard protocol during bolus administration of intravenous contrast. Multiplanar CT image reconstructions and MIPs were obtained to evaluate the vascular anatomy.   CONTRAST:  23mL ISOVUE-370 IOPAMIDOL (ISOVUE-370) INJECTION 76%   COMPARISON:  Comparison is made with April 08, 2020 and January 22, 2021 with respect to chest and abdomen.   FINDINGS: Cardiovascular: Calcified atheromatous plaque of the thoracic aorta. Ascending thoracic aortic dilation to 4.1 cm in the axial plane is similar to the previous study.   Descending thoracic aortic  dilation, proximally 4.8 x 4.5 cm within 1 mm of previous size as well. Atheromatous plaque throughout the thoracic aorta and branch vessels in the chest with tortuous origin of great vessels in the chest.   Heart size is stable with signs of coronary artery bypass grafting and without substantial pericardial effusion. Central pulmonary vasculature grossly unremarkable, not well assessed due to phase of contrast enhancement.   Mediastinum/Nodes: No hilar, thoracic inlet, axillary or mediastinal lymphadenopathy.   Lungs/Pleura: Small micro nodularity most areas with calcification at the RIGHT lung base, unchanged. The mild lingular scarring also stable.   Small  nodule in the RIGHT upper lobe 3-4 mm unchanged since the prior study of April 08, 2020. Pleural thickening in the inferior LEFT chest with pleural and parenchymal scarring in the LEFT lower lobe also stable. Airways are patent.   Upper Abdomen: No acute findings of the upper abdomen.   Post repair of abdominal aortic aneurysm. Aneurysm sac at 4.4 x 4.3 cm as compared to 4.6 x 4.0 cm.   Cholelithiasis. No acute findings related to pancreas or spleen. Adrenal glands are normal. Cyst in the RIGHT kidney stable from previous imaging   Thinning of abdominal wall musculature with similar appearance and rectus diastasis incompletely imaged.   Musculoskeletal: No acute bone finding. No destructive bone process. Spinal degenerative changes.   Review of the MIP images confirms the above findings.   IMPRESSION: Stable dilation of the ascending thoracic aortic dilation to 4.1 cm in the axial plane. Recommend annual imaging followup by CTA or MRA. This recommendation follows 2010 ACCF/AHA/AATS/ACR/ASA/SCA/SCAI/SIR/STS/SVM Guidelines for the Diagnosis and Management of Patients with Thoracic Aortic Disease. Circulation. 2010; 121: D220-U542. Aortic aneurysm NOS (ICD10-I71.9)   Descending thoracic with aneurysmal dilation, 4.8 x 4.5 cm within 1 mm of previous size as well.   Post repair of abdominal aortic aneurysm. Visualized neurysm sac at 4.4 x 4.3 cm as compared to 4.6 x 4.0 cm.   Cholelithiasis.   Aortic Atherosclerosis (ICD10-I70.0).     Electronically Signed   By: Zetta Bills M.D.   On: 04/27/2021 15:50 CT ANGIO CHEST AORTA W/CM & OR WO/CM  Result Date: 04/08/2020 CLINICAL DATA:  Thoracic aortic prominence. History of prostate carcinoma EXAM: CT ANGIOGRAPHY CHEST WITH CONTRAST TECHNIQUE: Multidetector CT imaging of the chest was performed using the standard protocol during bolus administration of intravenous contrast. Multiplanar CT image reconstructions and MIPs were  obtained to evaluate the vascular anatomy. CONTRAST:  47mL ISOVUE-370 IOPAMIDOL (ISOVUE-370) INJECTION 76% COMPARISON:  January 10, 2019 FINDINGS: Cardiovascular: The ascending thoracic diameter is stable, measuring 4.1 x 4.1 cm. There is stable dilatation of the proximal descending thoracic aorta with a measured diameter of 4.8 x 4.6 cm by remeasurement. There is atherosclerotic plaque in the periphery of the area of dilatation in the proximal descending aorta. There is no evident thoracic dissection. The thoracic aortic diameter at the sinuses of Valsalva measures 4.2 cm. Measured diameter of the aorta in the distal arch region measures 4.0 cm. There are scattered foci of calcification in the proximal visualized great vessels. There are foci of aortic calcification. There are multiple foci of coronary artery calcification. The patient is status post coronary artery bypass grafting. No pulmonary embolus is appreciable. There is no pericardial effusion or pericardial thickening. Mediastinum/Nodes: There is a 6 mm nodular opacity in the right lobe of the thyroid which per consensus guidelines does not warrant additional imaging surveillance. There is no appreciable thoracic adenopathy. No esophageal lesions are evident. Lungs/Pleura:  There is scarring in the extreme apices. There is bibasilar scarring with reticulonodular interstitial disease in the bases, also present previously. There is no edema or consolidation. No pleural effusions are evident. On axial slice 80 series 11, there is a stable 3 mm nodular opacity in the anterior segment of the right upper lobe. Upper Abdomen: There is cholelithiasis. There is incomplete visualization of a cyst arising from the lateral right kidney measuring 4.1 x 3.4 cm. There is upper abdominal aortic atherosclerosis. Musculoskeletal: No blastic or lytic bone lesions. Status post median sternotomy. No chest wall lesions evident. Review of the MIP images confirms the above findings.  IMPRESSION: 1. Thoracic aortic prominence, essentially stable. Dilatation of the aorta is most severe in the proximal descending thoracic aortic region with a maximum transverse diameter of 4.8 x 4.6 cm. Diameter in the mid to distal aortic arch region measures 4.0 cm. Recommend semi-annual imaging followup by CTA or MRA and referral to cardiothoracic surgery if not already obtained. This recommendation follows 2010 ACCF/AHA/AATS/ACR/ASA/SCA/SCAI/SIR/STS/SVM Guidelines for the Diagnosis and Management of Patients With Thoracic Aortic Disease. Circulation. 2010; 121: D638-V56. Aortic aneurysm NOS (ICD10-I71.9). No dissection. There is aortic atherosclerosis as well as foci of great vessel and native coronary artery calcification. Status post coronary artery bypass grafting. 2. Stable bibasilar reticulonodular interstitial disease. Stable 3 mm nodular opacity right upper lobe. No edema or airspace consolidation. 3.  No adenopathy. 4.  No evident pulmonary embolus. 5.  Cholelithiasis. Aortic Atherosclerosis (ICD10-I70.0). Electronically Signed   By: Lowella Grip III M.D.   On: 04/08/2020 14:48   Not mentioned in the report is evidence of dilatation of the vein graft to the right coronary artery appears stable from previous scans, the right vein graft appears to be patent  I have independently reviewed the above radiology studies  and reviewed the findings with the patient.   Ct Angio Chest Aorta W/cm &/or Wo/cm  Result Date: 10/27/2016 CLINICAL DATA:  Thoracic aortic aneurysm without rupture. EXAM: CT ANGIOGRAPHY CHEST WITH CONTRAST TECHNIQUE: Multidetector CT imaging of the chest was performed using the standard protocol during bolus administration of intravenous contrast. Multiplanar CT image reconstructions and MIPs were obtained to evaluate the vascular anatomy. CONTRAST:  75 mL of Isovue 370 intravenously. COMPARISON:  CT scan of May 18, 2016. FINDINGS: Cardiovascular: Stable 4.2 cm ascending  thoracic aortic aneurysm is noted. Stable 4.5 cm proximal descending thoracic aortic aneurysm is noted. Aortic root measures 3.9 cm. Atherosclerosis of thoracic aorta is noted. No dissection is noted. Great vessels are widely patent. Status post coronary artery bypass graft. 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 Mediastinum/Nodes: No enlarged mediastinal, hilar, or axillary lymph nodes. Thyroid gland, trachea, and esophagus demonstrate no significant findings. Lungs/Pleura: Lungs are clear. No pleural effusion or pneumothorax. Upper Abdomen: Cholelithiasis is noted. No other significant abnormality is noted in the visualized portion of upper abdomen. Musculoskeletal: No chest wall abnormality. No acute or significant osseous findings. Review of the MIP images confirms the above findings. IMPRESSION: Stable 4.2 cm ascending thoracic aortic aneurysm. Stable 4.5 cm proximal descending thoracic aortic aneurysm. Recommend annual imaging followup by CTA or MRA. This recommendation follows 2010 ACCF/AHA/AATS/ACR/ASA/SCA/SCAI/SIR/STS/SVM Guidelines for the Diagnosis and Management of Patients with Thoracic Aortic Disease. Circulation. 2010; 121: E332-R518. Lithiasis. Electronically Signed   By: Marijo Conception, M.D.   On: 10/27/2016 13:52      Recent Lab Findings: Lab Results  Component Value Date   WBC 5.8 08/20/2019   HGB 8.9 (  L) 08/20/2019   HCT 28.5 (L) 08/20/2019   PLT 216 08/20/2019   GLUCOSE 102 (H) 08/20/2019   CHOL 106 03/02/2017   TRIG 57 03/02/2017   HDL 32 (L) 03/02/2017   LDLCALC 63 03/02/2017   ALT 19 06/24/2019   AST 19 06/24/2019   NA 137 08/20/2019   K 3.6 08/20/2019   CL 107 08/20/2019   CREATININE 0.54 (L) 08/20/2019   BUN 18 08/20/2019   CO2 24 08/20/2019   INR 1.4 (H) 04/16/2019   HGBA1C 5.2 06/18/2019      Assessment / Plan:  #1 stable 4.1 cm ascending thoracic aneurysm #2 stable Descending thoracic with aneurysmal dilation, 4.8 x 4.5 cm  within 1 mm of previous size as well. #3 Post repair of abdominal aortic aneurysm. Visualized neurysm sac at 4.4 x 4.3 cm as compared to 4.6 x 4.0 cm. He follows [facility] and had an appointment with them soon.  #4 history of coronary artery disease currently the patient has no symptoms of angina status post coronary artery bypass grafting in 1992 #4 history of hypertension- well controlled today #5 history of hyperlipidemia currently on simvastatin    I reviewed with patient the CT scan of the chest done today and do not recommend any surgical intervention for his aneurysmal disease based on its current size.  He was cautioned about strenuous lifting or the use of fluoroquinolone antibiotics.  Patient notes that in next week he will be  87 years old, but he would like to come back in 1 year for follow-up. I recommend following up with [facility] for abdominal aneurysm and varicose veins. He also has appointments with his dentist and urologist.     Nicholes Rough, PA-C      [address] [place] [address] Office [phone]

## 2021-04-29 DIAGNOSIS — N3945 Continuous leakage: Secondary | ICD-10-CM | POA: Diagnosis not present

## 2021-04-29 DIAGNOSIS — N3041 Irradiation cystitis with hematuria: Secondary | ICD-10-CM | POA: Diagnosis not present

## 2021-04-29 DIAGNOSIS — R3914 Feeling of incomplete bladder emptying: Secondary | ICD-10-CM | POA: Diagnosis not present

## 2021-04-29 DIAGNOSIS — C61 Malignant neoplasm of prostate: Secondary | ICD-10-CM | POA: Diagnosis not present

## 2021-04-29 DIAGNOSIS — R4182 Altered mental status, unspecified: Secondary | ICD-10-CM | POA: Diagnosis not present

## 2021-05-04 DIAGNOSIS — D508 Other iron deficiency anemias: Secondary | ICD-10-CM | POA: Diagnosis not present

## 2021-05-04 DIAGNOSIS — D511 Vitamin B12 deficiency anemia due to selective vitamin B12 malabsorption with proteinuria: Secondary | ICD-10-CM | POA: Diagnosis not present

## 2021-05-04 DIAGNOSIS — E039 Hypothyroidism, unspecified: Secondary | ICD-10-CM | POA: Diagnosis not present

## 2021-05-04 DIAGNOSIS — D519 Vitamin B12 deficiency anemia, unspecified: Secondary | ICD-10-CM | POA: Diagnosis not present

## 2021-05-27 DIAGNOSIS — Z8546 Personal history of malignant neoplasm of prostate: Secondary | ICD-10-CM | POA: Diagnosis not present

## 2021-05-27 DIAGNOSIS — N3945 Continuous leakage: Secondary | ICD-10-CM | POA: Diagnosis not present

## 2021-05-27 DIAGNOSIS — R3914 Feeling of incomplete bladder emptying: Secondary | ICD-10-CM | POA: Diagnosis not present

## 2021-06-24 DIAGNOSIS — E559 Vitamin D deficiency, unspecified: Secondary | ICD-10-CM | POA: Diagnosis not present

## 2021-07-13 ENCOUNTER — Non-Acute Institutional Stay: Payer: Medicare Other

## 2021-07-13 ENCOUNTER — Non-Acute Institutional Stay: Payer: Medicare Other | Admitting: *Deleted

## 2021-07-13 ENCOUNTER — Other Ambulatory Visit: Payer: Self-pay

## 2021-07-13 DIAGNOSIS — Z515 Encounter for palliative care: Secondary | ICD-10-CM

## 2021-07-13 NOTE — Progress Notes (Signed)
[facility] COMMUNITY PALLIATIVE CARE RN NOTE  PATIENT NAME: Jesse [name] DOB: January 28, 1928 MRN: [id number]  PRIMARY CARE PROVIDER: [name], MD  RESPONSIBLE PARTY:  Acct ID - Guarantor Home Phone Work Phone Relationship Acct Type  [id number] - Jesse [name], Jesse [name] [phone]  Self P/F     [facility], [address], [place], [place] [address]   Covid-19 Pre-screening Negative  PLAN OF CARE and INTERVENTION:  ADVANCE CARE PLANNING/GOALS OF CARE; Goal is for patient to remain at current AL facility, [facility].  PATIENT/CAREGIVER EDUCATION: N/A DISEASE STATUS: Joint visit made with LCSW, M. Lonon. Met with patient at the nursing station. He is alert and oriented x 4, pleasant mood and engaging. He denies any pain or discomfort. Respirations are even, regular and unlabored. He is ambulatory using a cane. He is independent with ADLs. No recent falls. He says his appetite is good. No issues with swallowing. He speaks about being hospitalized 3 times this year. 12/12/20 for small bowel obstruction, 01/22/21 and 02/15/21 for gross hematuria. He has not had any hospitalizations since then and is very pleased with this. He denies having any issues with hematuria since then. He has no complaints today and feels his condition has been stable.   HISTORY OF PRESENT ILLNESS:   This is a 87 yo male with a history of CAD/remote CABG, systolic CHF, prostate cancer s/p radiation tx in 2010, HTN, HLD and anemia. Palliative care team continues to follow patient for additional support, goals of care and complex decision making.   CODE STATUS: DNR ADVANCED DIRECTIVES: Y MOST FORM: yes PPS: 50%   (Duration of visit and documentation 30 minutes)   Daryl Eastern, RN BSN

## 2021-07-14 NOTE — Progress Notes (Signed)
COMMUNITY PALLIATIVE CARE SW NOTE  PATIENT NAME: Jesse [name] DOB: [birth year] MRN: [id number]  PRIMARY CARE PROVIDER: [name], MD  RESPONSIBLE PARTY:  Acct ID - Guarantor Home Phone Work Phone Relationship Acct Type  [id number] - ROHEN, KIMES [phone]  Self P/F     [facility], [place], [place], [place] [address]     PLAN OF CARE and INTERVENTIONS:             GOALS OF CARE/ ADVANCE CARE PLANNING:  [place] lis for patient to remain in the facility. Patient is a DNR. SOCIAL/EMOTIONAL/SPIRITUAL ASSESSMENT/ INTERVENTIONS:  SW and RN-M. Nadara Mustard completed a joint visit made with LCSW, M. Taysen Bushart. The team visited with patient at the nursing station. He is alert and oriented x 4, pleasant, good spirits and engaging. He denies any pain or discomfort. He ambulates independently cane and report no recent falls. He remains independent with ADLs. He says his appetite is good and he has no issues with swallowing. He has not had any further hospitalizations. Patient remains open to ongoing palliative care support and visits. The team also consulted with the facility nurse-Kesha who verbalized no concerns regarding patient.  PATIENT/CAREGIVER EDUCATION/ COPING:  Patient is alert and oriented x3. He appears to be coping well.  PERSONAL EMERGENCY PLAN:  Per facility protocol. COMMUNITY RESOURCES COORDINATION/ HEALTH CARE NAVIGATION:  None. FINANCIAL/LEGAL CONCERNS/INTERVENTIONS:  None.     SOCIAL HX:  Social History   Tobacco Use   Smoking status: Former    Years: 20.00    Types: Cigarettes    Quit date: 07/25/1968    Years since quitting: 53.0   Smokeless tobacco: Never  Substance Use Topics   Alcohol use: No    CODE STATUS: DNR ADVANCED DIRECTIVES: Yes MOST FORM COMPLETE:  No HOSPICE EDUCATION PROVIDED: No  PPS: Patient is alert and oriented x 4. He is independent for ADL's. He ambulates with a walker. He reports that his appetite is good.    Duration of visit and  documentation: 45 minutes.    [address] [place], [place]

## 2021-07-15 DIAGNOSIS — E785 Hyperlipidemia, unspecified: Secondary | ICD-10-CM | POA: Diagnosis not present

## 2021-07-15 DIAGNOSIS — D638 Anemia in other chronic diseases classified elsewhere: Secondary | ICD-10-CM | POA: Diagnosis not present

## 2021-07-15 DIAGNOSIS — F32A Depression, unspecified: Secondary | ICD-10-CM | POA: Diagnosis not present

## 2021-07-15 DIAGNOSIS — E559 Vitamin D deficiency, unspecified: Secondary | ICD-10-CM | POA: Diagnosis not present

## 2021-07-28 DIAGNOSIS — F5101 Primary insomnia: Secondary | ICD-10-CM | POA: Diagnosis not present

## 2021-07-28 DIAGNOSIS — F32A Depression, unspecified: Secondary | ICD-10-CM | POA: Diagnosis not present

## 2021-07-28 DIAGNOSIS — F413 Other mixed anxiety disorders: Secondary | ICD-10-CM | POA: Diagnosis not present

## 2021-08-27 DIAGNOSIS — R310 Gross hematuria: Secondary | ICD-10-CM | POA: Diagnosis not present

## 2021-08-27 DIAGNOSIS — Z8546 Personal history of malignant neoplasm of prostate: Secondary | ICD-10-CM | POA: Diagnosis not present

## 2021-09-02 DIAGNOSIS — E559 Vitamin D deficiency, unspecified: Secondary | ICD-10-CM | POA: Diagnosis not present

## 2021-09-28 DIAGNOSIS — E559 Vitamin D deficiency, unspecified: Secondary | ICD-10-CM | POA: Diagnosis not present

## 2021-10-07 DIAGNOSIS — E559 Vitamin D deficiency, unspecified: Secondary | ICD-10-CM | POA: Diagnosis not present

## 2021-10-07 DIAGNOSIS — I5030 Unspecified diastolic (congestive) heart failure: Secondary | ICD-10-CM | POA: Diagnosis not present

## 2021-10-07 DIAGNOSIS — R32 Unspecified urinary incontinence: Secondary | ICD-10-CM | POA: Diagnosis not present

## 2021-10-07 DIAGNOSIS — K582 Mixed irritable bowel syndrome: Secondary | ICD-10-CM | POA: Diagnosis not present

## 2021-10-21 DIAGNOSIS — R319 Hematuria, unspecified: Secondary | ICD-10-CM | POA: Diagnosis not present

## 2021-10-21 DIAGNOSIS — L259 Unspecified contact dermatitis, unspecified cause: Secondary | ICD-10-CM | POA: Diagnosis not present

## 2021-10-21 DIAGNOSIS — I10 Essential (primary) hypertension: Secondary | ICD-10-CM | POA: Diagnosis not present

## 2021-11-23 DIAGNOSIS — E559 Vitamin D deficiency, unspecified: Secondary | ICD-10-CM | POA: Diagnosis not present

## 2021-12-22 ENCOUNTER — Encounter: Payer: Self-pay | Admitting: Vascular Surgery

## 2021-12-22 ENCOUNTER — Ambulatory Visit: Payer: Medicare Other | Admitting: Vascular Surgery

## 2021-12-22 VITALS — BP 101/67 | HR 66 | Temp 97.9°F | Resp 20 | Ht 72.0 in | Wt 186.0 lb

## 2021-12-22 DIAGNOSIS — I7143 Infrarenal abdominal aortic aneurysm, without rupture: Secondary | ICD-10-CM

## 2021-12-22 NOTE — Progress Notes (Signed)
Patient ID: Jesse [name], male   DOB: May 16, 1928, 88 y.o.   MRN: [id number]  Reason for Consult: Follow-up   Referred by [name], MD  Subjective:     HPI:  Jesse [name] is a 88 y.o. male history of abdominal aortic aneurysm as well as right common femoral aneurysm repair in the past.  He is followed now for bilateral hypogastric aneurysms.  He was supposed to be scheduled for CT scan prior to this visit but this was not performed.  No new back or abdominal pain.  He has recently moved to an inpatient living facility due to multiple falls.  Past Medical History:  Diagnosis Date   AAA (abdominal aortic aneurysm) ([place])    Arthritis    Cancer ([place]) 2010   prostate ( 40 Txs. of radiation treatments)   Coronary artery disease    Environmental allergies    GERD (gastroesophageal reflux disease)    History of prostate cancer 2010   HOH (hard of hearing)    wears bilateral hearing aids   Hyperlipidemia    Hypertension    Kidney stone 1968   Myocardial infarction ([place]) 1992   Pneumonia    hx of   RBBB    Family History  Problem Relation Age of Onset   Cancer Mother    Cancer Father    Varicose Veins Father    Cancer Sister    Diabetes Sister    Heart attack Sister    Past Surgical History:  Procedure Laterality Date   ABDOMINAL AORTIC ANEURYSM REPAIR  08-13-1993   Dr Cameron Sprang   CARDIAC CATHETERIZATION     COLONOSCOPY W/ POLYPECTOMY     CORONARY ARTERY BYPASS GRAFT  03-01-1991   Dr. Servando Snare   CYSTOSCOPY WITH FULGERATION N/A 01/23/2021   Procedure: Consuela Mimes WITH FULGERATION;  Surgeon: Lucas Mallow, MD;  [facility]: [facility];  Service: Urology;  Laterality: N/A;   ESOPHAGOGASTRODUODENOSCOPY (EGD) WITH PROPOFOL N/A 05/04/2018   Procedure: ESOPHAGOGASTRODUODENOSCOPY (EGD) WITH PROPOFOL;  Surgeon: [name], MD;  [facility];  Service: Gastroenterology;  Laterality: N/A;   EYE SURGERY     Detached retina (Left eye); bilateral cataract removal   FALSE  ANEURYSM REPAIR  05/07/2012   Procedure: REPAIR FALSE ANEURYSM;  Surgeon: Rosetta Posner, MD;  Location: [facility] OR;  Service: Vascular;  Laterality: Left;  Repair of Left Femoral Artery Aneurysm   HERNIA REPAIR     HOT HEMOSTASIS N/A 05/04/2018   Procedure: HOT HEMOSTASIS (ARGON PLASMA COAGULATION/BICAP);  Surgeon: [name], MD;  Location: [facility];  Service: Gastroenterology;  Laterality: N/A;   INTRAMEDULLARY (IM) NAIL INTERTROCHANTERIC Left 04/16/2019   Procedure: INTRAMEDULLARY (IM) NAIL INTERTROCHANTRIC;  Surgeon: Rod Can, MD;  [facility];  Service: Orthopedics;  Laterality: Left;   INTRAMEDULLARY (IM) NAIL INTERTROCHANTERIC Right 06/17/2019   Procedure: RIGHT INTRAMEDULLARY (IM) NAIL INTERTROCHANTRIC;  Surgeon: Rod Can, MD;  [facility];  Service: Orthopedics;  Laterality: Right;   IR NASO G TUBE PLC W/FL W/RAD  12/15/2020   ORIF PATELLA Right 06/09/2015   Procedure: OPEN REDUCTION INTERNAL (ORIF) FIXATION PATELLA;  Surgeon: Marybelle Killings, MD;  Location: [facility];  Service: Orthopedics;  Laterality: Right;   repair of bowel obstruction  1999   Dr. Dalbert Batman   repair of ventral hernia  04-20-1994   P. Cameron Sprang MD   resection femoral artery  03/19/2012   Dr. [name] Early   SUBMUCOSAL INJECTION  05/04/2018   Procedure: SUBMUCOSAL INJECTION;  Surgeon: [name], MD;  Location: [facility];  Service: Gastroenterology;;   TRANSURETHRAL RESECTION OF BLADDER TUMOR N/A 08/18/2019   Procedure: cysto fulgeration clot evacuation;  Surgeon: Cleon Gustin, MD;  [facility];  Service: Urology;  Laterality: N/A;    Short Social History:  Social History   Tobacco Use   Smoking status: Former    Years: 20.00    Types: Cigarettes    Quit date: 07/25/1968    Years since quitting: 53.4   Smokeless tobacco: Never  Substance Use Topics   Alcohol use: No    Allergies  Allergen Reactions   Quinolones Other (See Comments)    Unknown reaction   Adhesive [Tape]  Itching and Rash    Current Outpatient Medications  Medication Sig Dispense Refill   acetaminophen (TYLENOL) 500 MG tablet Take 500 mg by mouth every 6 (six) hours as needed for mild pain or headache.     alendronate (FOSAMAX) 70 MG tablet Take 70 mg by mouth every Saturday.     bisacodyl (DULCOLAX) 5 MG EC tablet Take 1 tablet (5 mg total) by mouth daily as needed for moderate constipation. 30 tablet 1   carboxymethylcellulose (REFRESH PLUS) 0.5 % SOLN Place 2 drops into both eyes at bedtime.     carvedilol (COREG) 3.125 MG tablet TAKE 1 TABLET BY MOUTH 2 TIMES DAILY WITH A MEAL 60 tablet 0   docusate sodium (COLACE) 100 MG capsule Take 1 capsule (100 mg total) by mouth 2 (two) times daily. 60 capsule 2   fenofibrate 160 MG tablet Take 1 tablet (160 mg total) by mouth daily. 90 tablet 1   finasteride (PROSCAR) 5 MG tablet Take 5 mg by mouth daily.     Lactobacillus (ACIDOPHILUS PROBIOTIC PO) Take 1 capsule by mouth 2 (two) times daily.     melatonin 3 MG TABS tablet Take 3 mg by mouth at bedtime as needed (sleep).     pantoprazole (PROTONIX) 40 MG tablet Take 40 mg by mouth daily.     polyethylene glycol (MIRALAX / GLYCOLAX) 17 g packet Take 17 g by mouth daily. 14 each 0   sertraline (ZOLOFT) 50 MG tablet Take 50 mg by mouth daily.     simvastatin (ZOCOR) 40 MG tablet Take 1 tablet (40 mg total) by mouth daily at 6 PM. 90 tablet 1   sodium chloride (OCEAN) 0.65 % SOLN nasal spray Place 2 sprays into both nostrils 2 (two) times daily.     spironolactone (ALDACTONE) 25 MG tablet Take 12.5 mg by mouth daily.     tamsulosin (FLOMAX) 0.4 MG CAPS capsule Take 0.4 mg by mouth at bedtime.     No current facility-administered medications for this visit.    Review of Systems  Constitutional:  Constitutional negative. HENT: HENT negative.  Eyes: Eyes negative.  Respiratory: Respiratory negative.  Cardiovascular: Cardiovascular negative.  GI: Gastrointestinal negative.  GU: Positive for  hematuria.  Musculoskeletal: Musculoskeletal negative.  Skin: Skin negative.  Neurological: Neurological negative. Hematologic: Hematologic/lymphatic negative.  Psychiatric: Psychiatric negative.       Objective:  Objective   Vitals:   12/22/21 1023  BP: 101/67  Pulse: 66  Resp: 20  Temp: 97.9 F (36.6 C)  SpO2: 95%  Weight: 186 lb (84.4 kg)  Height: 6' (1.829 m)   Body mass index is 25.23 kg/m.  Physical Exam HENT:     Head: Normocephalic.     Nose: Nose normal.  Eyes:     Pupils: Pupils are  equal, round, and reactive to light.  Cardiovascular:     Rate and Rhythm: Normal rate.     Pulses:          Femoral pulses are 2+ on the right side and 2+ on the left side.      Popliteal pulses are 3+ on the right side and 3+ on the left side.  Pulmonary:     Effort: Pulmonary effort is normal.  Abdominal:     General: Abdomen is flat.     Palpations: Abdomen is soft.  Musculoskeletal:        General: Normal range of motion.     Cervical back: Normal range of motion and neck supple.  Skin:    General: Skin is warm.     Capillary Refill: Capillary refill takes less than 2 seconds.  Neurological:     General: No focal deficit present.     Mental Status: He is alert.  Psychiatric:        Mood and Affect: Mood normal.    Data: No studies     Assessment/Plan:    89-year male history of abdominal aortic aneurysm repair and right common femoral artery aneurysm repair with known hypogastric artery aneurysms and very strong popliteal pulses to suggest popliteal aneurysms.  He did not have a CT prior to today's visit we will get a CT scan and have him back in 4 to 6 weeks unless he has issues prior.     Waynetta Sandy MD [facility]

## 2022-01-12 ENCOUNTER — Other Ambulatory Visit: Payer: Self-pay

## 2022-01-12 DIAGNOSIS — I7143 Infrarenal abdominal aortic aneurysm, without rupture: Secondary | ICD-10-CM

## 2022-01-28 ENCOUNTER — Ambulatory Visit
Admission: RE | Admit: 2022-01-28 | Discharge: 2022-01-28 | Disposition: A | Discharge status: Home / Self Care | Payer: Medicare Other | Source: Ambulatory Visit | Attending: Vascular Surgery | Admitting: Vascular Surgery

## 2022-01-28 DIAGNOSIS — I714 Abdominal aortic aneurysm, without rupture, unspecified: Secondary | ICD-10-CM | POA: Diagnosis not present

## 2022-01-28 DIAGNOSIS — I724 Aneurysm of artery of lower extremity: Secondary | ICD-10-CM | POA: Diagnosis not present

## 2022-01-28 DIAGNOSIS — K439 Ventral hernia without obstruction or gangrene: Secondary | ICD-10-CM | POA: Diagnosis not present

## 2022-01-28 DIAGNOSIS — I743 Embolism and thrombosis of arteries of the lower extremities: Secondary | ICD-10-CM | POA: Diagnosis not present

## 2022-01-28 DIAGNOSIS — I7143 Infrarenal abdominal aortic aneurysm, without rupture: Secondary | ICD-10-CM

## 2022-01-28 MED ORDER — IOPAMIDOL (ISOVUE-370) INJECTION 76%
100.0000 mL | Freq: Once | INTRAVENOUS | Status: AC | PRN
  Administered 2022-01-28: 100 mL via INTRAVENOUS

## 2022-02-02 ENCOUNTER — Ambulatory Visit: Payer: Medicare Other | Admitting: Vascular Surgery

## 2022-02-02 ENCOUNTER — Encounter: Payer: Self-pay | Admitting: Vascular Surgery

## 2022-02-02 VITALS — BP 106/67 | HR 64 | Temp 97.7°F | Resp 20 | Ht 72.0 in | Wt 182.0 lb

## 2022-02-02 DIAGNOSIS — I724 Aneurysm of artery of lower extremity: Secondary | ICD-10-CM

## 2022-02-02 DIAGNOSIS — I7143 Infrarenal abdominal aortic aneurysm, without rupture: Secondary | ICD-10-CM | POA: Diagnosis not present

## 2022-02-02 NOTE — Progress Notes (Signed)
Patient ID: Jesse [name], male   DOB: 05/07/1928, 88 y.o.   MRN: [id number]  Reason for Consult: Follow-up   Referred by [name], MD  Subjective:     HPI:  Jesse [name] is a 88 y.o. male here for follow-up of bilateral hypogastric artery aneurysms as well as bilateral popliteal artery aneurysms.  Previously had open heart surgery as well as aorto by iliac artery aneurysm repair of his aortic aneurysm and subsequent repair of an ventral hernia.  He does not have any new pelvic or lower extremity pain.  He continues to walk with the help of a walker.  He is hopeful for no future surgeries.  Past Medical History:  Diagnosis Date   AAA (abdominal aortic aneurysm) ([place])    Arthritis    Cancer ([place]) 2010   prostate ( 40 Txs. of radiation treatments)   Coronary artery disease    Environmental allergies    GERD (gastroesophageal reflux disease)    History of prostate cancer 2010   HOH (hard of hearing)    wears bilateral hearing aids   Hyperlipidemia    Hypertension    Kidney stone 1968   Myocardial infarction ([place]) 1992   Pneumonia    hx of   RBBB    Family History  Problem Relation Age of Onset   Cancer Mother    Cancer Father    Varicose Veins Father    Cancer Sister    Diabetes Sister    Heart attack Sister    Past Surgical History:  Procedure Laterality Date   ABDOMINAL AORTIC ANEURYSM REPAIR  08-13-1993   Dr Cameron Sprang   CARDIAC CATHETERIZATION     COLONOSCOPY W/ POLYPECTOMY     CORONARY ARTERY BYPASS GRAFT  03-01-1991   Dr. Servando Snare   CYSTOSCOPY WITH FULGERATION N/A 01/23/2021   Procedure: Consuela Mimes WITH FULGERATION;  Surgeon: Lucas Mallow, MD;  [facility];  Service: Urology;  Laterality: N/A;   ESOPHAGOGASTRODUODENOSCOPY (EGD) WITH PROPOFOL N/A 05/04/2018   Procedure: ESOPHAGOGASTRODUODENOSCOPY (EGD) WITH PROPOFOL;  Surgeon: [name], MD;  [facility];  Service: Gastroenterology;  Laterality: N/A;   EYE SURGERY     Detached  retina (Left eye); bilateral cataract removal   FALSE ANEURYSM REPAIR  05/07/2012   Procedure: REPAIR FALSE ANEURYSM;  Surgeon: Rosetta Posner, MD;  Location: [facility] OR;  Service: Vascular;  Laterality: Left;  Repair of Left Femoral Artery Aneurysm   HERNIA REPAIR     HOT HEMOSTASIS N/A 05/04/2018   Procedure: HOT HEMOSTASIS (ARGON PLASMA COAGULATION/BICAP);  Surgeon: [name], MD;  Location: [facility];  Service: Gastroenterology;  Laterality: N/A;   INTRAMEDULLARY (IM) NAIL INTERTROCHANTERIC Left 04/16/2019   Procedure: INTRAMEDULLARY (IM) NAIL INTERTROCHANTRIC;  Surgeon: Rod Can, MD;  [facility];  Service: Orthopedics;  Laterality: Left;   INTRAMEDULLARY (IM) NAIL INTERTROCHANTERIC Right 06/17/2019   Procedure: RIGHT INTRAMEDULLARY (IM) NAIL INTERTROCHANTRIC;  Surgeon: Rod Can, MD;  [facility];  Service: Orthopedics;  Laterality: Right;   IR NASO G TUBE PLC W/FL W/RAD  12/15/2020   ORIF PATELLA Right 06/09/2015   Procedure: OPEN REDUCTION INTERNAL (ORIF) FIXATION PATELLA;  Surgeon: Marybelle Killings, MD;  Location: [facility];  Service: Orthopedics;  Laterality: Right;   repair of bowel obstruction  1999   Dr. Dalbert Batman   repair of ventral hernia  04-20-1994   P. Cameron Sprang MD   resection femoral artery  03/19/2012   Dr. [name] Early   [facility]  05/04/2018   Procedure: SUBMUCOSAL INJECTION;  Surgeon: [name], MD;  Location: [facility];  Service: Gastroenterology;;   TRANSURETHRAL RESECTION OF BLADDER TUMOR N/A 08/18/2019   Procedure: cysto fulgeration clot evacuation;  Surgeon: Cleon Gustin, MD;  [facility]: [facility];  Service: Urology;  Laterality: N/A;    Short Social History:  Social History   Tobacco Use   Smoking status: Former    Years: 20.00    Types: Cigarettes    Quit date: 07/25/1968    Years since quitting: 53.5   Smokeless tobacco: Never  Substance Use Topics   Alcohol use: No    Allergies  Allergen Reactions   Quinolones Other  (See Comments)    Unknown reaction   Adhesive [Tape] Itching and Rash    Current Outpatient Medications  Medication Sig Dispense Refill   acetaminophen (TYLENOL) 500 MG tablet Take 500 mg by mouth every 6 (six) hours as needed for mild pain or headache.     alendronate (FOSAMAX) 70 MG tablet Take 70 mg by mouth every Saturday.     carboxymethylcellulose (REFRESH PLUS) 0.5 % SOLN Place 2 drops into both eyes at bedtime.     carvedilol (COREG) 3.125 MG tablet TAKE 1 TABLET BY MOUTH 2 TIMES DAILY WITH A MEAL 60 tablet 0   fenofibrate 160 MG tablet Take 1 tablet (160 mg total) by mouth daily. 90 tablet 1   finasteride (PROSCAR) 5 MG tablet Take 5 mg by mouth daily.     Lactobacillus (ACIDOPHILUS PROBIOTIC PO) Take 1 capsule by mouth 2 (two) times daily.     melatonin 3 MG TABS tablet Take 3 mg by mouth at bedtime as needed (sleep).     pantoprazole (PROTONIX) 40 MG tablet Take 40 mg by mouth daily.     polyethylene glycol (MIRALAX / GLYCOLAX) 17 g packet Take 17 g by mouth daily. 14 each 0   sertraline (ZOLOFT) 50 MG tablet Take 50 mg by mouth daily.     simvastatin (ZOCOR) 40 MG tablet Take 1 tablet (40 mg total) by mouth daily at 6 PM. 90 tablet 1   sodium chloride (OCEAN) 0.65 % SOLN nasal spray Place 2 sprays into both nostrils 2 (two) times daily.     spironolactone (ALDACTONE) 25 MG tablet Take 12.5 mg by mouth daily.     tamsulosin (FLOMAX) 0.4 MG CAPS capsule Take 0.4 mg by mouth at bedtime.     No current facility-administered medications for this visit.    Review of Systems  Constitutional:  Constitutional negative. HENT: HENT negative.  Eyes: Eyes negative.  Respiratory: Respiratory negative.  Cardiovascular: Cardiovascular negative.  GI: Gastrointestinal negative.  Musculoskeletal: Musculoskeletal negative.  Skin: Skin negative.  Neurological: Neurological negative. Hematologic: Hematologic/lymphatic negative.  Psychiatric: Psychiatric negative.        Objective:   Objective   Vitals:   02/02/22 1357  BP: 106/67  Pulse: 64  Resp: 20  Temp: 97.7 F (36.5 C)  SpO2: 97%  Weight: 182 lb (82.6 kg)  Height: 6' (1.829 m)   Body mass index is 24.68 kg/m.  Physical Exam HENT:     Head: Normocephalic.     Nose: Nose normal.  Eyes:     Pupils: Pupils are equal, round, and reactive to light.  Cardiovascular:     Rate and Rhythm: Normal rate.     Pulses:          Femoral pulses are 2+ on the right side and 2+ on the left side.  Popliteal pulses are 3+ on the right side and 3+ on the left side.  Pulmonary:     Effort: Pulmonary effort is normal.  Abdominal:     General: Abdomen is flat.     Palpations: Abdomen is soft. There is no mass.  Musculoskeletal:        General: Normal range of motion.     Right lower leg: No edema.     Left lower leg: No edema.  Skin:    General: Skin is warm and dry.     Capillary Refill: Capillary refill takes less than 2 seconds.  Neurological:     General: No focal deficit present.     Mental Status: He is alert.  Psychiatric:        Mood and Affect: Mood normal.     Data: CTA IMPRESSION: Bilateral popliteal artery aneurysm:   -right estimated 3.8 cm   -left has serial components, proximally estimated 3.1 cm and distally estimated 5.2 cm   Tibial arterial patency cannot be assessed given late arrival of the contrast bolus.   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 Perigraft seroma has developed over time, with diameter now estimated 5.3 cm.   Redemonstration of bilateral hypogastric artery aneurysm:   -right-sided estimated 6.3 cm. Hypogastric artery origin has been surgically ligated, and although there is no contrast filling the aneurysm on the current CTA, this is likely a CT timing issue, as there is evidence of back fill  secondary to patent pelvic arteries.   -left-sided estimated 6.1 cm   Redemonstration surgical changes of bilateral common femoral artery aneurysm repair.       Assessment/Plan:     88 year old male with persistent large hypogastric artery aneurysms and bilateral popliteal artery aneurysms which have may be grown some over time with a history of infrarenal abdominal aortic aneurysm repair with aortobiiliac graft.  Patient continues to not want any surgery that is not absolutely necessary.  We again discussed the signs and symptoms of rupture of his hypogastric arteries as well as rupture or more likely thrombosis of his popliteal arteries that would require emergent evaluation and he demonstrates good understanding.  Short of this he will follow-up in 1 year with repeat CT angio.     Waynetta Sandy MD [facility]

## 2022-02-16 DIAGNOSIS — F413 Other mixed anxiety disorders: Secondary | ICD-10-CM | POA: Diagnosis not present

## 2022-02-16 DIAGNOSIS — F32A Depression, unspecified: Secondary | ICD-10-CM | POA: Diagnosis not present

## 2022-02-16 DIAGNOSIS — F5101 Primary insomnia: Secondary | ICD-10-CM | POA: Diagnosis not present

## 2022-02-22 DIAGNOSIS — E612 Magnesium deficiency: Secondary | ICD-10-CM | POA: Diagnosis not present

## 2022-02-22 DIAGNOSIS — E559 Vitamin D deficiency, unspecified: Secondary | ICD-10-CM | POA: Diagnosis not present

## 2022-02-22 DIAGNOSIS — I10 Essential (primary) hypertension: Secondary | ICD-10-CM | POA: Diagnosis not present

## 2022-03-03 DIAGNOSIS — N3041 Irradiation cystitis with hematuria: Secondary | ICD-10-CM | POA: Diagnosis not present

## 2022-03-03 DIAGNOSIS — R310 Gross hematuria: Secondary | ICD-10-CM | POA: Diagnosis not present

## 2022-03-03 DIAGNOSIS — C61 Malignant neoplasm of prostate: Secondary | ICD-10-CM | POA: Diagnosis not present

## 2022-03-30 DIAGNOSIS — F5101 Primary insomnia: Secondary | ICD-10-CM | POA: Diagnosis not present

## 2022-03-30 DIAGNOSIS — F413 Other mixed anxiety disorders: Secondary | ICD-10-CM | POA: Diagnosis not present

## 2022-03-30 DIAGNOSIS — F32A Depression, unspecified: Secondary | ICD-10-CM | POA: Diagnosis not present

## 2022-04-01 ENCOUNTER — Other Ambulatory Visit: Payer: Self-pay | Admitting: Surgery

## 2022-04-01 DIAGNOSIS — I7121 Aneurysm of the ascending aorta, without rupture: Secondary | ICD-10-CM

## 2022-05-03 ENCOUNTER — Non-Acute Institutional Stay: Payer: Medicare Other | Admitting: Family Medicine

## 2022-05-03 NOTE — Progress Notes (Unsigned)
Therapist, nutritional Palliative Care Consult Note Telephone: [phone]  Fax: [phone]    Date of encounter: 05/03/22 2:30 PM PATIENT NAME: Jesse [name] At [facility] [address] [place] [address]   [phone] (home)  DOB: [birth year] MRN: [id number] PRIMARY CARE PROVIDER:    Sigmund Hazel, MD,  [address] [place] [address] [phone]  REFERRING PROVIDER:   Sigmund Hazel, MD [address] [place],  [place] [address] [phone]  RESPONSIBLE PARTY:    Contact Information     Name Relation Home Work [place] Son [phone]  [phone]   Santiago Bumpers Daughter [phone]  [phone]        I met face to face with patient in assisted living facility. Palliative Care was asked to follow this patient by consultation request of  Sigmund Hazel, MD to address advance care planning and complex medical decision making. 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 Patient/health care surrogate gave his/her permission to discuss. Our advance care planning conversation included a discussion about:    The value and importance of advance care planning  Experiences with loved ones who have been seriously ill or have died  Exploration of personal, cultural or spiritual beliefs that might influence medical decisions  Exploration of goals of care in the event of a sudden injury or illness  Identification of a healthcare agent  Review and updating or creation of an  advance directive document . Decision not to resuscitate or to de-escalate disease focused treatments due to poor prognosis. CODE STATUS:  DNR    Follow up Palliative Care Visit: Palliative care will continue to follow for complex medical decision making, advance care planning,  and clarification of goals. Return *** weeks or prn.    This visit was coded based on medical decision making (MDM).  PPS: ***0%  HOSPICE ELIGIBILITY/DIAGNOSIS: TBD  Chief Complaint: ***  HISTORY OF PRESENT ILLNESS:  Jesse [name] is a 88 y.o. year old male with *** .  Ambulates with RW in facility, uses cane for provider appts and sits in w[facility] to do am care. HOH. Reg house diet, lactose intolerant  History obtained from review of EMR, discussion with primary team, and interview with family, facility staff/caregiver and/or Jesse [name].   03/01/22 Vitamin D 64.4 02/22/22 CMP remarkable for elevated BUN 23.2 and low protein 5.3, alk phos 35, osmolality 276.3 CBC remarkable for low rbc 3.65, hgb 11.8, hct 35% Lipid profile WNL  I reviewed EMR for available labs, medications, imaging, studies and related documents.  There are no new records since last visit/Records reviewed and summarized above.   ROS General: NAD EYES: denies vision changes ENMT: denies dysphagia Cardiovascular: denies chest pain, denies DOE Pulmonary: denies cough, denies increased SOB Abdomen: endorses good appetite, denies constipation, endorses continence of bowel GU: denies dysuria, endorses continence of urine MSK:  denies increased weakness,  no falls reported Skin: denies rashes or wounds Neurological: denies pain, denies insomnia Psych: Endorses positive mood Heme/lymph/immuno: denies bruises, abnormal bleeding  Physical Exam: Current and past weights: 182 lbs 02/02/22 Constitutional: NAD General: frail appearing, thin/WNWD/obese  EYES: anicteric sclera, lids intact, no discharge  ENMT: intact hearing, oral mucous membranes moist, dentition intact CV: S1S2, RRR, no LE edema Pulmonary: LCTA, no increased work of breathing, no cough,  room air Abdomen: intake 100%, normo-active BS + 4 quadrants, soft and non tender, no ascites GU: deferred MSK: no sarcopenia, moves all extremities, ambulatory Skin: warm and  dry, no rashes or wounds on visible skin Neuro:  no generalized weakness,  no cognitive impairment Psych: non-anxious affect, A and O x 3 Hem/lymph/immuno: no widespread bruising   Thank you for the opportunity to participate in the care of Jesse [name].  The palliative care team will continue to follow. Please call our office at [phone] if we can be of additional assistance.   Marijo Conception, FNP -C  COVID-19 PATIENT SCREENING TOOL Asked and negative response unless otherwise noted:   Have you had symptoms of covid, tested positive or been in contact with someone with symptoms/positive test in the past 5-10 days?

## 2022-05-04 ENCOUNTER — Non-Acute Institutional Stay: Payer: Medicare Other | Admitting: Family Medicine

## 2022-05-04 ENCOUNTER — Ambulatory Visit
Admission: RE | Admit: 2022-05-04 | Discharge: 2022-05-04 | Disposition: A | Discharge status: Home / Self Care | Payer: Medicare Other | Source: Ambulatory Visit | Attending: Surgery | Admitting: Surgery

## 2022-05-04 ENCOUNTER — Encounter: Payer: Self-pay | Admitting: Family Medicine

## 2022-05-04 ENCOUNTER — Ambulatory Visit: Payer: Medicare Other | Admitting: Surgical

## 2022-05-04 VITALS — BP 119/77 | HR 60 | Resp 20 | Ht 72.0 in | Wt 182.0 lb

## 2022-05-04 VITALS — BP 106/62 | HR 61 | Resp 16

## 2022-05-04 DIAGNOSIS — I7121 Aneurysm of the ascending aorta, without rupture: Secondary | ICD-10-CM

## 2022-05-04 DIAGNOSIS — Z9189 Other specified personal risk factors, not elsewhere classified: Secondary | ICD-10-CM | POA: Insufficient documentation

## 2022-05-04 DIAGNOSIS — I5022 Chronic systolic (congestive) heart failure: Secondary | ICD-10-CM

## 2022-05-04 DIAGNOSIS — I724 Aneurysm of artery of lower extremity: Secondary | ICD-10-CM

## 2022-05-04 DIAGNOSIS — I513 Intracardiac thrombosis, not elsewhere classified: Secondary | ICD-10-CM | POA: Diagnosis not present

## 2022-05-04 DIAGNOSIS — I253 Aneurysm of heart: Secondary | ICD-10-CM | POA: Diagnosis not present

## 2022-05-04 DIAGNOSIS — J984 Other disorders of lung: Secondary | ICD-10-CM | POA: Diagnosis not present

## 2022-05-04 DIAGNOSIS — I712 Thoracic aortic aneurysm, without rupture, unspecified: Secondary | ICD-10-CM | POA: Diagnosis not present

## 2022-05-04 DIAGNOSIS — Z515 Encounter for palliative care: Secondary | ICD-10-CM | POA: Insufficient documentation

## 2022-05-04 MED ORDER — IOPAMIDOL (ISOVUE-370) INJECTION 76%
75.0000 mL | Freq: Once | INTRAVENOUS | Status: AC | PRN
  Administered 2022-05-04: 75 mL via INTRAVENOUS

## 2022-05-04 NOTE — Progress Notes (Signed)
Therapist, nutritional Palliative Care Consult Note Telephone: [phone]  Fax: [phone]    Date of encounter: 05/04/22 11:10 AM PATIENT NAME: Jesse [name] At [facility] [address] [place] [address]   [phone] (home)  DOB: 20-Mar-1928 MRN: [id number] PRIMARY CARE PROVIDER:    Sigmund Hazel, MD,  [address] [place] [address] [phone]  REFERRING PROVIDER:   Sigmund Hazel, MD [address] [place],  [place] [address] [phone]  RESPONSIBLE PARTY:    Contact Information     Name Relation Home Work [place] Son [phone]  [phone]   Santiago Bumpers Daughter [phone]  [phone]        I met face to face with patient in assisted living facility. Palliative Care was asked to follow this patient by consultation request of  Sigmund Hazel, MD to address advance care planning and complex medical decision making. This is a follow up visit   ASSESSMENT , SYMPTOM MANAGEMENT AND PLAN / RECOMMENDATIONS:  Chronic systolic CHF Stable at present, euvolemic. Recommend continuing daily weights. Medically managed with Coreg BID and spironolactone daily No significant evidence of kidney disease but pt with bleeding may have not been started on ACE-I/ARNI. Recommend continued follow up by Cardiologist at least annually   At high risk for excessive bleeding, aneurysms of ascending thoracic aorta/BLE and femoral artery Recommend maintenance of BP < 130/80. 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 If aneurysm dissects, pt likely to exsanguinate. May need to discuss MOST at next follow up. Aneurysms also complicated in some vessels by clot with pt's advanced age, gait disturbance would be even more high risk with anticoagulants. Recommend follow up by CVTS  at least every 6 months with CT imaging and prn if symptomatic.    Advance Care Planning/Goals of  Care: Goals include to maximize quality of life and symptom management.  Decision not to resuscitate or to de-escalate disease focused treatments due to poor prognosis. CODE STATUS:  DNR    Follow up Palliative Care Visit: Palliative care will continue to follow for complex medical decision making, advance care planning, and clarification of goals. Return 4 weeks or prn.    This visit was coded based on medical decision making (MDM).  PPS: 70%  HOSPICE ELIGIBILITY/DIAGNOSIS: TBD  Chief Complaint:  Palliative Care is following for chronic medical management in setting of chronic systolic heart failure with multiple vessel aneurysms.  HISTORY OF PRESENT ILLNESS:  Jesse [name] is a 88 y.o. 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  Denies pain, SOB at rest and endorses DOE.  No falls, nausea or vomiting. Cancer of prostate cured with radiation.  Had some radiation issues last year that caused some urge incontinence and bleeding which he underwent urological procedure to correct but had no indication of recurrent cancer on visualization.  Has bowel continence but has some issues with mixed stress and urge incontinence. A little constipation intermittently. Reports having 6 vessels bypassed during CABG with vessels removed from left leg and some chronic edema in that leg.  He is independent with bathing and dressing. Pt asking for handicapped placard. He states being seen at the [name]  yesterday to look at getting hearing aids. Pt has follow up cardiology appt today. Ambulates with RW in facility, uses cane for provider appts and sits in wc to do am care. HOH. Reg house diet, lactose intolerant.   Pt is an Animator from [place]  History obtained from review of EMR,  interview with facility staff and/or Mr. Crompton.   Last echo 05/25/18: EF 40-45%, dilated ascending aorta. Grade I diastolic dysfunction and elevated end diastolic pressure  07/30/03 Vitamin D 64.4 02/22/22 CMP remarkable for elevated BUN 23.2 and low protein 5.3, alk phos 35, osmolality 276.3 CBC remarkable for low rbc 3.65, hgb 11.8, hct 35% Lipid profile WNL  01/28/22 CT angio aorta and bifem w/wo contrast: FINDINGS: VASCULAR   Cardiac: Incidental imaging of coronary artery bypass graft aneurysm, partially thrombosed, of the right coronary graft. Diameter estimated 2.6 cm on the current CT. The diameter on the most remote CT of 2015, aneurysm measured 17 mm.   Aorta: Atherosclerotic changes of the distal thoracic aorta with no pedunculated plaque or ulcerated plaque.   Diameter at the hiatus measures 3.6 cm.   Diameter at the renal arteries measures 3.0 cm.   Redemonstration of open surgical repair of infrarenal abdominal aortic aneurysm with aorto bi-iliac graft. The right iliac limb appears to be and end to end femoral artery anastomosis.   The left iliac limb is either an end to end anastomosis of the CIA, an end to end anastomosis of the EIA with reimplantation of the left hypogastric artery, or a bifurcated iliac graft to the EIA and hypogastric artery.   Over time there has been development of a perigraft seroma of the excluded aneurysm sac with the greatest diameter measuring 5.3 cm just below the IMA origin. No inflammatory changes or periaortic fluid.   Celiac: Celiac artery patent with minimal atherosclerosis. Some mild ectasia of the common hepatic artery. No aneurysm of the splenic artery or the hepatic artery.   SMA: Patent, with no significant atherosclerotic changes.   Renals:   - Right: Single right renal artery. Ectasia of the right renal artery.   - Left: Single left renal artery.  Ectasias of the left renal artery.   IMA: IMA appears to have been surgically ligated.   Right lower extremity:   Iliac limb of the surgical graft is patent with no significant endo luminal thrombus/fibrosis. No stenosis. This appears to be in end to end anastomosis of the femoral artery.   Redemonstration of hypogastric artery aneurysm. Diameter measures 6.3 cm transversely on the current CT, measuring 6.9 cm transversely on the CT 01/22/2021. Although no flow is directly detected within the aneurysm sac, there does appear to be retrograde filling from pelvic arteries, which are opacified on the late series imaging.   Redemonstration of surgical changes in the right common femoral artery of prior aneurysm resection and graft. Unremarkable appearance of the anastomosis.   Profunda femoris and the thigh branches remain patent.   Circumferential calcification of the SFA with no high-grade stenosis or occlusion.   Popliteal artery aneurysm with circumferential thrombus. Estimated AP dimension on the axial images 3.8 cm.   Beyond the aneurysm, there is no contrast within the arterial system, secondary to late arrival. Calcifications of the tibial arteries.   Left lower extremity:   Iliac limb is patent with patency of the hypogastric artery origin and the external iliac artery/graft.   Surgical changes of the left inguinal region of prior common femoral artery aneurysm resection and grafting.  Redemonstration of hypogastric artery aneurysm, estimated 6.1 cm on the axial images. Prior dimension estimated 6.1 cm on the CT of 01/22/2021.   Profunda femoris and the thigh branches are patent.   Circumferential calcification of the SFA with no high-grade stenosis or occlusion.   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 The first segment measures approximately 3.1 cm with very little circumferential thrombus/plaque. The second segment is estimated  5.2 cm transversely. Flow channel appears patent with poor opacification secondary to late arrival of the contrast.   Tibial arteries demonstrate calcifications, though are not evaluated given the late arrival of the contrast.   Veins: Unremarkable appearance of the venous system.   Review of the MIP images confirms the above findings.   NON-VASCULAR   Lower chest: No acute finding of the lower chest   Hepatobiliary: Unremarkable appearance of the liver. Unremarkable gall bladder.   Pancreas: Unremarkable.   Spleen: Unremarkable.   Adrenals/Urinary Tract:   - Right adrenal gland: Unremarkable   - Left adrenal gland: Unremarkable.   - Right kidney: No hydronephrosis, nephrolithiasis, inflammation, or ureteral dilation. Simple cyst in the lateral cortex of the right kidney measuring 3.0 cm. Additional smaller lesions are too small to characterize   - Left Kidney: No hydronephrosis, nephrolithiasis, inflammation, or ureteral dilation. No focal lesion.   - Urinary Bladder: Decompressed   Stomach/Bowel:   - Stomach: Unremarkable.   - Small bowel: Unremarkable   - Appendix: Normal.   - Colon: Left-sided colonic diverticula.  No inflammatory changes.   Lymphatic: No adenopathy.   Mesenteric: Ventral wall hernia containing broad mesenteric fat and some loops of bowel. No evidence of entrapment. No obstruction.   Reproductive: Unremarkable appearance of the prostate   Other: No ascites or free air.   Musculoskeletal: Osteopenia. No displaced fracture. No bony canal narrowing. Surgical changes of bilateral hip repair   IMPRESSION: Bilateral popliteal artery aneurysm:   -right estimated 3.8 cm   -left has serial components, proximally estimated 3.1 cm and distally estimated 5.2 cm   Tibial arterial patency cannot be assessed given late arrival of the contrast bolus.   Incidental imaging of right coronary artery graft aneurysm, 2.6 cm   Redemonstration of  surgical changes of open repair infrarenal abdominal aortic aneurysm with aorto bi-iliac graft, as above. Perigraft seroma has developed over time, with diameter now estimated 5.3 cm.   Redemonstration of bilateral hypogastric artery aneurysm:   -right-sided estimated 6.3 cm. Hypogastric artery origin has been surgically ligated, and although there is no contrast filling the aneurysm on the current CTA, this is likely a CT timing issue, as there is evidence of back fill secondary to patent pelvic arteries.   -left-sided estimated 6.1 cm   Redemonstration surgical changes of bilateral common femoral artery aneurysm repair.   Aortic atherosclerosis and nonocclusive femoropopliteal disease. Aortic Atherosclerosis (ICD10-I70.0).   Additional ancillary findings as above.  I reviewed EMR for available labs, medications, imaging, studies and related documents.  There are no new records since last visit/Records reviewed and summarized above.   ROS General: NAD EYES: endorses vision changes ENMT: denies dysphagia Cardiovascular: denies chest pain, endorses DOE, LLE edema Pulmonary: denies cough, denies increased SOB Abdomen: endorses good appetite, occasional intermittent constipation, endorses continence of bowel GU: denies dysuria, endorses urge continence of urine MSK:  denies increased weakness,  no falls reported Skin: denies rashes or wounds Neurological: denies pain, denies insomnia Psych: Endorses positive mood Heme/lymph/immuno: denies bruises, abnormal bleeding  Physical  Exam: Current and past weights: 182 lbs 02/02/22 Constitutional: NAD General: WNWD ENMT: hard of hearing, oral mucous membranes moist, dentition intact CV: S1S2, RRR,  LLE trace ankle edema wearing compression sock Pulmonary: CTAB, no increased work of breathing, no cough, room air Abdomen: normo-active BS + 4 quadrants, soft and non tender MSK: no sarcopenia, moves all extremities, ambulatory with  slow/slightly unsteady gait using cane or walker Skin: warm and dry, no rashes or wounds on visible skin Neuro:  no generalized weakness,  no cognitive impairment Psych: non-anxious affect, A and O x 3 Hem/lymph/immuno: no widespread bruising   Thank you for the opportunity to participate in the care of Mr. Ware.  The palliative care team will continue to follow. Please call our office at [phone] if we can be of additional assistance.   Marijo Conception, FNP -C  COVID-19 PATIENT SCREENING TOOL Asked and negative response unless otherwise noted:   Have you had symptoms of covid, tested positive or been in contact with someone with symptoms/positive test in the past 5-10 days?  NO

## 2022-05-04 NOTE — Progress Notes (Addendum)
Subjective:     Patient ID: Jesse [name], male    DOB: [birth year], 88 y.o.   MRN: [id number]  Chief Complaint  Patient presents with   Thoracic Aortic Aneurysm    1 year f/u with CTA Chest    HPI Patient is in today for ongoing surveillance of his resting aneurysmal disease.  He describes overall that he feels well for age 5.  He exercises in a limited capacity with some mild shortness of breath.  He does not have any chest pain.  He does get some lower extremity edema.  This is chronic on the left side since his CABG with greater saphenous vein harvest.  He is seen for multiple peripheral vascular and abdominal aneurysmal findings with previous surgeries by vascular surgery.  He is a DNR and is seen by palliative care to maximize quality of life and symptom management.  He is not appear to be a candidate for any type of open aneurysmal surgical repair due to multiple comorbidities and age.  Review of symptoms: Some incontinence symptoms but otherwise denies   Past Medical History:  Diagnosis Date   AAA (abdominal aortic aneurysm) ([place])    Arthritis    Cancer ([place]) 2010   prostate ( 40 Txs. of radiation treatments)   Coronary artery disease    Environmental allergies    GERD (gastroesophageal reflux disease)    History of prostate cancer 2010   HOH (hard of hearing)    wears bilateral hearing aids   Hyperlipidemia    Hypertension    Kidney stone 1968   Myocardial infarction ([place]) 1992   Pneumonia    hx of   RBBB    Patient Active Problem List   Diagnosis Date Noted   Palliative care encounter 05/04/2022   Hematuria 02/19/2021   GERD without esophagitis 02/15/2021   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   Gross hematuria 01/23/2021   Palliative care by specialist    SBO (small bowel obstruction) ([place]) 12/12/2020   Abducens (sixth) nerve palsy, left 02/20/2020   Binocular vision disorder with diplopia 02/20/2020   History of prostate cancer 08/15/2019   Anxiety  06/21/2019   Anemia due to chronic illness 06/20/2019   Pneumonia due to COVID-19 virus 06/20/2019   Displaced spiral fracture of shaft of right femur, initial encounter for open fracture type I or II ([place]) 06/20/2019   COVID-19 virus infection 06/19/2019   Goals of care, counseling/discussion    Hip fracture ([place]) 04/15/2019   Fall    Coronary artery disease involving native coronary artery of native heart without angina pectoris 11/20/2018   Thoracic aortic aneurysm without rupture ([place]) 72/03/4708   Chronic systolic heart failure ([place]) 11/20/2018   Duodenal ulcer with hemorrhage 05/05/2018   Hemorrhagic shock ([place]) 05/05/2018   AKI (acute kidney injury) ([place]) 05/05/2018   Orthostatic hypotension 05/04/2018   Anemia associated with acute blood loss 05/04/2018   Melena 05/04/2018   GI bleed 05/04/2018   Patella fracture 06/09/2015   Aftercare following surgery of the circulatory system, NEC 01/08/2013   Pain in limb- Left popliteal 12/25/2012   Aneurysm artery, femoral ([place]) 05/22/2012   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/03/2012   Aneurysm of abdominal vessel ([place]) 02/21/2012   Iliac artery aneurysm, bilateral ([place]) 08/09/2011   Aneurysm of artery of lower extremity ([place]) 08/09/2011   HLD (hyperlipidemia) 01/16/2009   Essential hypertension 01/16/2009   CAD, ARTERY BYPASS GRAFT 01/16/2009   PERIPHERAL VASCULAR DISEASE 01/16/2009   ABDOMINAL AORTIC ANEURYSM  REPAIR, HX OF 01/16/2009   Mixed hyperlipidemia 12/28/2008     Past Surgical History:  Procedure Laterality Date   ABDOMINAL AORTIC ANEURYSM REPAIR  08-13-1993   Dr Cameron Sprang   CARDIAC CATHETERIZATION     COLONOSCOPY W/ POLYPECTOMY     CORONARY ARTERY BYPASS GRAFT  03-01-1991   Dr. Servando Snare   CYSTOSCOPY WITH FULGERATION N/A 01/23/2021   Procedure: CYSTOSCOPY WITH FULGERATION;  Surgeon: Lucas Mallow, MD;  Location: [facility];  Service: Urology;  Laterality: N/A;   ESOPHAGOGASTRODUODENOSCOPY (EGD) WITH PROPOFOL N/A  05/04/2018   Procedure: ESOPHAGOGASTRODUODENOSCOPY (EGD) WITH PROPOFOL;  Surgeon: [name], MD;  Location: [facility];  Service: Gastroenterology;  Laterality: N/A;   EYE SURGERY     Detached retina (Left eye); bilateral cataract removal   FALSE ANEURYSM REPAIR  05/07/2012   Procedure: REPAIR FALSE ANEURYSM;  Surgeon: Rosetta Posner, MD;  Location: [facility] OR;  Service: Vascular;  Laterality: Left;  Repair of Left Femoral Artery Aneurysm   HERNIA REPAIR     HOT HEMOSTASIS N/A 05/04/2018   Procedure: HOT HEMOSTASIS (ARGON PLASMA COAGULATION/BICAP);  Surgeon: [name], MD;  Location: [facility];  Service: Gastroenterology;  Laterality: N/A;   INTRAMEDULLARY (IM) NAIL INTERTROCHANTERIC Left 04/16/2019   Procedure: INTRAMEDULLARY (IM) NAIL INTERTROCHANTRIC;  Surgeon: Rod Can, MD;  Location: [facility];  Service: Orthopedics;  Laterality: Left;   INTRAMEDULLARY (IM) NAIL INTERTROCHANTERIC Right 06/17/2019   Procedure: RIGHT INTRAMEDULLARY (IM) NAIL INTERTROCHANTRIC;  Surgeon: Rod Can, MD;  Location: [facility];  Service: Orthopedics;  Laterality: Right;   IR NASO G TUBE PLC W/FL W/RAD  12/15/2020   ORIF PATELLA Right 06/09/2015   Procedure: OPEN REDUCTION INTERNAL (ORIF) FIXATION PATELLA;  Surgeon: Marybelle Killings, MD;  Location: [facility];  Service: Orthopedics;  Laterality: Right;   repair of bowel obstruction  1999   Dr. Dalbert Batman   repair of ventral hernia  04-20-1994   P. Cameron Sprang MD   resection femoral artery  03/19/2012   Dr. [name] Early   [place] INJECTION  05/04/2018   Procedure: SUBMUCOSAL INJECTION;  Surgeon: [name], MD;  Location: [facility];  Service: Gastroenterology;;   TRANSURETHRAL RESECTION OF BLADDER TUMOR N/A 08/18/2019   Procedure: cysto fulgeration clot evacuation;  Surgeon: Cleon Gustin, MD;  Location: [facility];  Service: Urology;  Laterality: N/A;     Current Outpatient Medications  Medication Instructions   acetaminophen (TYLENOL) 500 mg, Oral, Every  6 hours PRN   alendronate (FOSAMAX) 70 mg, Oral, Every Sat   carboxymethylcellulose (REFRESH PLUS) 0.5 % SOLN 2 drops, Both Eyes, Daily at bedtime   carvedilol (COREG) 3.125 MG tablet TAKE 1 TABLET BY MOUTH 2 TIMES DAILY WITH A MEAL   fenofibrate 160 mg, Oral, Daily   finasteride (PROSCAR) 5 mg, Oral, Daily   Lactobacillus (ACIDOPHILUS PROBIOTIC PO) 1 capsule, Oral, 2 times daily   melatonin 3 mg, Oral, At bedtime PRN   pantoprazole (PROTONIX) 40 mg, Oral, Daily   polyethylene glycol (MIRALAX / GLYCOLAX) 17 g, Oral, Daily   sertraline (ZOLOFT) 50 mg, Oral, Daily   simvastatin (ZOCOR) 40 mg, Oral, Daily-1800   sodium chloride (OCEAN) 0.65 % SOLN nasal spray 2 sprays, Each Nare, 2 times daily   spironolactone (ALDACTONE) 12.5 mg, Oral, Daily   tamsulosin (FLOMAX) 0.4 mg, Oral, Daily at bedtime          Objective:    BP 119/77   Pulse 60   Resp 20   Ht 6' (1.829 m)  Wt 182 lb (82.6 kg)   SpO2 97% Comment: RA  BMI 24.68 kg/m  BP Readings from Last 3 Encounters:  05/04/22 119/77  05/04/22 106/62  02/02/22 106/67   Wt Readings from Last 3 Encounters:  05/04/22 182 lb (82.6 kg)  02/02/22 182 lb (82.6 kg)  12/22/21 186 lb (84.4 kg)      Physical Exam Constitutional:      General: He is not in acute distress.    Appearance: He is normal weight.  Neck:     Vascular: No carotid bruit.  Cardiovascular:     Rate and Rhythm: Normal rate and regular rhythm.     Pulses: Normal pulses.  Pulmonary:     Effort: Pulmonary effort is normal.     Breath sounds: Normal breath sounds.  Abdominal:     General: Abdomen is flat.     Palpations: Abdomen is soft.  Musculoskeletal:     Cervical back: Normal range of motion and neck supple.  Skin:    General: Skin is warm and dry.     Coloration: Skin is not jaundiced or pale.  Neurological:     General: No focal deficit present.     Mental Status: He is alert and oriented to person, place, and time.  Psychiatric:        Mood and  Affect: Mood normal.        Behavior: Behavior normal.        Thought Content: Thought content normal.     CLINICAL DATA: Follow-up thoracic aortic aneurysm.  EXAM: CT ANGIOGRAPHY CHEST WITH CONTRAST  TECHNIQUE: Multidetector CT imaging of the chest was performed using the standard protocol during bolus administration of intravenous contrast. Multiplanar CT image reconstructions and MIPs were obtained to evaluate the vascular anatomy.  RADIATION DOSE REDUCTION: This exam was performed according to the departmental dose-optimization program which includes automated exposure control, adjustment of the mA and/or kV according to patient size and/or use of iterative reconstruction technique.  CONTRAST: 67m ISOVUE-370 IOPAMIDOL (ISOVUE-370) INJECTION 76%  COMPARISON: 04/27/2021  FINDINGS: Cardiovascular:  Aortic dimensions:  Aortic annulus: Measures 2.4 cm. Unchanged  Aortic sinuses of Valsalva: Measures 3.5 cm. Unchanged  Sinotubular junction: Measures 2.9 cm. Unchanged  Ascending aorta: Measures 4.1 cm. Unchanged  High ascending aorta: Measures 4.1 cm. Previously 4.0 cm  Anterior arch: Measures 3.2 cm. Unchanged  Mid transverse arch: Measures 4.2 cm. Previously 4.3 cm  Posterior arch: Measures 4.3 cm. Unchanged  Proximal descending: Measures 4.6 cm. Unchanged.  Distal descending: Measures 3.8 cm. Unchanged.  Aortic atherosclerotic calcifications.  Signs of previous median sternotomy and CABG procedure. Focal aneurysmal dilatation of the RCA graft with eccentric mural thrombus measuring 2.5 x 2.5 cm, image 121/5. Formally this measured the same.  Mediastinum/Nodes: No enlarged supraclavicular, axillary, mediastinal or hilar lymph nodes. Thyroid gland, trachea and esophagus are unremarkable.  Lungs/Pleura: No pleural effusion. Scarring is identified within the inferior lingula and anterolateral left lung base, similar to previous exam. No signs of pleural  effusion, interstitial edema or acute airspace consolidation. No pneumothorax. Stable right middle lobe nodule measuring 4 mm, image 81/7. This most likely represents a benign abnormality requiring no further follow-up.  Upper Abdomen: Multiple stones identified within the gallbladder. Aortic atherosclerotic calcifications. Partially visualized right kidney cyst measures 4.2 cm and is compatible with a benign Bosniak class 1 lesion. No follow-up imaging recommended.  Musculoskeletal: Bones appear diffusely osteopenic. No acute or significant osseous findings.  Review of the MIP images confirms the above  findings.  IMPRESSION: 1. Stable aneurysmal dilatation of the ascending thoracic aorta measuring up to 4.1 cm. Recommend annual imaging followup by CTA or MRA. This recommendation follows 2010 ACCF/AHA/AATS/ACR/ASA/SCA/SCAI/SIR/STS/SVM Guidelines for the Diagnosis and Management of Patients with Thoracic Aortic Disease. Circulation. 2010; 121: E952-W413. Aortic aneurysm NOS (ICD10-I71.9) 2. Stable aneurysmal dilatation of the proximal descending thoracic aorta measuring up to 4.6 cm. 3. Stable focal aneurysmal dilatation of the RCA graft with eccentric mural thrombus measuring 2.5 x 2.5 cm. 4. Gallstones. 5. Stable right middle lobe nodule measuring 4 mm. This most likely represents a benign abnormality requiring no further follow-up. 6. Aortic Atherosclerosis (ICD10-I70.0).   Electronically Signed By: Kerby Moors M.D. On: 05/04/2022 12:59      Assessment & Plan:   The patient's aneurysmal findings are stable in terms of both ascending and descending thoracic.  He would not be a candidate for any kind of ascending aneurysm repair.  I discussed medical management and in specific good control of blood pressure which is well controlled.  Discussed encouraging healthy lifestyle including exercise as tolerated and nutritious food.  Clinically, he is doing well considering age and  past history.  I do not think he requires any further ongoing ascending thoracic aneurysm surveillance at this time we will see him again on a as needed basis.  I did give the patient the choice as to whether he would like to continue ongoing surveillance scans and he declines at this time.     Problem List Items Addressed This Visit     Thoracic aortic aneurysm without rupture ([place]) - Primary    No orders of the defined types were placed in this encounter.   No follow-ups on file.  John Giovanni, PA-C

## 2022-05-04 NOTE — Patient Instructions (Signed)
Continue good control of blood pressure, continue exercising as you are, continue to eat nutritious food.

## 2022-05-25 DIAGNOSIS — F32A Depression, unspecified: Secondary | ICD-10-CM | POA: Diagnosis not present

## 2022-05-25 DIAGNOSIS — F413 Other mixed anxiety disorders: Secondary | ICD-10-CM | POA: Diagnosis not present

## 2022-05-25 DIAGNOSIS — F5101 Primary insomnia: Secondary | ICD-10-CM | POA: Diagnosis not present

## 2022-06-14 DIAGNOSIS — H35373 Puckering of macula, bilateral: Secondary | ICD-10-CM | POA: Diagnosis not present

## 2022-06-14 DIAGNOSIS — H01021 Squamous blepharitis right upper eyelid: Secondary | ICD-10-CM | POA: Diagnosis not present

## 2022-06-14 DIAGNOSIS — H16223 Keratoconjunctivitis sicca, not specified as Sjogren's, bilateral: Secondary | ICD-10-CM | POA: Diagnosis not present

## 2022-06-14 DIAGNOSIS — H5203 Hypermetropia, bilateral: Secondary | ICD-10-CM | POA: Diagnosis not present

## 2022-06-22 DIAGNOSIS — F32A Depression, unspecified: Secondary | ICD-10-CM | POA: Diagnosis not present

## 2022-06-22 DIAGNOSIS — F5101 Primary insomnia: Secondary | ICD-10-CM | POA: Diagnosis not present

## 2022-06-22 DIAGNOSIS — F413 Other mixed anxiety disorders: Secondary | ICD-10-CM | POA: Diagnosis not present

## 2022-06-30 DIAGNOSIS — I10 Essential (primary) hypertension: Secondary | ICD-10-CM | POA: Diagnosis not present

## 2022-06-30 DIAGNOSIS — E785 Hyperlipidemia, unspecified: Secondary | ICD-10-CM | POA: Diagnosis not present

## 2022-06-30 DIAGNOSIS — D638 Anemia in other chronic diseases classified elsewhere: Secondary | ICD-10-CM | POA: Diagnosis not present

## 2022-06-30 DIAGNOSIS — F0390 Unspecified dementia without behavioral disturbance: Secondary | ICD-10-CM | POA: Diagnosis not present

## 2022-07-07 DIAGNOSIS — D6489 Other specified anemias: Secondary | ICD-10-CM | POA: Diagnosis not present

## 2022-07-07 DIAGNOSIS — I10 Essential (primary) hypertension: Secondary | ICD-10-CM | POA: Diagnosis not present

## 2022-07-07 DIAGNOSIS — E785 Hyperlipidemia, unspecified: Secondary | ICD-10-CM | POA: Diagnosis not present

## 2022-07-07 DIAGNOSIS — F03A0 Unspecified dementia, mild, without behavioral disturbance, psychotic disturbance, mood disturbance, and anxiety: Secondary | ICD-10-CM | POA: Diagnosis not present

## 2022-08-09 DIAGNOSIS — R0981 Nasal congestion: Secondary | ICD-10-CM | POA: Diagnosis not present

## 2022-08-09 DIAGNOSIS — E785 Hyperlipidemia, unspecified: Secondary | ICD-10-CM | POA: Diagnosis not present

## 2022-08-09 DIAGNOSIS — F03A0 Unspecified dementia, mild, without behavioral disturbance, psychotic disturbance, mood disturbance, and anxiety: Secondary | ICD-10-CM | POA: Diagnosis not present

## 2022-08-09 DIAGNOSIS — I10 Essential (primary) hypertension: Secondary | ICD-10-CM | POA: Diagnosis not present

## 2022-08-10 ENCOUNTER — Non-Acute Institutional Stay: Payer: Medicare Other | Admitting: Family Medicine

## 2022-08-10 ENCOUNTER — Encounter: Payer: Self-pay | Admitting: Family Medicine

## 2022-08-10 VITALS — BP 122/76 | HR 70 | Temp 97.9°F | Resp 16

## 2022-08-10 DIAGNOSIS — I724 Aneurysm of artery of lower extremity: Secondary | ICD-10-CM

## 2022-08-10 DIAGNOSIS — I5022 Chronic systolic (congestive) heart failure: Secondary | ICD-10-CM

## 2022-08-10 DIAGNOSIS — I7121 Aneurysm of the ascending aorta, without rupture: Secondary | ICD-10-CM

## 2022-08-10 DIAGNOSIS — Z9181 History of falling: Secondary | ICD-10-CM

## 2022-08-10 NOTE — Progress Notes (Signed)
[facility] Palliative Care Consult Note Telephone: [phone]  Fax: [phone]    Date of encounter: 08/10/22 8:07 PM PATIENT NAME: Jesse [name]   [id number] (home)  DOB: 09-09-1927 MRN: [id number] PRIMARY CARE PROVIDER:    [name], MD,  [place] [address] [phone]  REFERRING PROVIDER:   Kathyrn [name], [place],  [place] [address] [phone]  RESPONSIBLE PARTY:    Contact Information     Name Relation Home Work [place] Son [phone]  [phone]   Jesse [name] [phone]  [phone]        I met face to face with patient in assisted living facility. Palliative Care was asked to follow this patient by consultation request of  Jesse Lass, MD to address advance care planning and complex medical decision making. This is a follow up visit    ASSESSMENT , SYMPTOM MANAGEMENT AND PLAN / RECOMMENDATIONS:  Chronic systolic CHF Stable at present, euvolemic. Continue current plan of care  At high risk for excessive bleeding, aneurysms of ascending thoracic aorta/BLE and femoral artery BP at goal No current bleeding Will address with pt and HC POA if he wants to complete MOST form at present time.  3.  Fall risk Agree that best balance is  served at present time with use of rolling walker    Advance Care Planning/Goals of Care: Goals include to maximize quality of life and symptom management.  Living will designates that Jesse [name] can authorize withholding or withdrawal of life sustaining procedures when and if he is terminally ill, persistently in a coma, suffer severe dementia or is in a persistent vegetative state.  Living will also expresses his wish not to have his life prolonged with life sustaining procedures in these situation.  Health care POA is Jesse [name] as wife Jesse [name] is  [name]. Decision not to resuscitate or to de-escalate disease focused treatments due to poor prognosis. CODE STATUS:  DNR    Follow up Palliative Care Visit: Palliative care will continue to follow for complex medical decision making, advance care planning, and clarification of goals. Return 4 weeks or prn.    This visit was coded based on medical decision making (MDM).  PPS: 70%  HOSPICE ELIGIBILITY/DIAGNOSIS: TBD  Chief Complaint:  Palliative Care is following for chronic medical management in setting of chronic systolic heart failure with multiple vessel aneurysms.  HISTORY OF PRESENT ILLNESS:  Jesse [name] is a 89 y.o. year old male with chronic systolic heart failure, CAD, thoracic  aortic aneurysm, AAA s/p repair, femoral artery aneurysm, bilateral iliac artery aneurysm, PVD, HTN, orthostatic hypotension, hx of prostate cancer  s/p radiation with radiation cystitis and SBO/GI bleed, GERD, hx of fall with knee and femoral fractures, and abducens nerve palsy on left.  Cancer of prostate cured with radiation without recurrence. Denies pain, SOB at rest DOE. He is currently going down to have dinner for the day but states he has had typically increased edema around left ankle which is chronic since his fracture.  No falls, nausea or vomiting but states his doctor said he needed to be ambulating with a walker to prevent falls. Has bowel continence but has some issues with mixed stress and urge incontinence.    Pt is an Animator from [place]  History obtained from review of EMR,  interview with  Jesse [name].   I reviewed EMR  for available labs, medications, imaging, studies and related documents.  There are no new records since last visit.   ROS General: NAD ENMT: denies dysphagia Cardiovascular: denies chest pain,  denies DOE, 1+ LLE ankle edema non-pitting, trace RLE ankle edema Pulmonary: denies cough, denies increased SOB Abdomen: endorses good appetite, endorses continence of  bowel GU: denies dysuria, endorses urge continence of urine MSK:  denies increased weakness,  no falls reported Skin: denies rashes or wounds Neurological: denies pain, denies insomnia Psych: Endorses positive mood Heme/lymph/immuno: denies bruises, abnormal bleeding  Physical Exam: Current and past weights: 182 lbs 02/02/22 (no recent weight) Constitutional: NAD General: WNWD ENMT: hard of hearing, oral mucous membranes moist, dentition intact CV: S1S2, RRR,  LLE trace ankle edema wearing compression socks Pulmonary: CTAB, no increased work of breathing, no cough, room air Abdomen: normo-active BS + 4 quadrants, soft and non tender MSK: no sarcopenia, moves all extremities, ambulatory with slow steady gait using walker Skin: warm and dry, no rashes or wounds on visible skin Neuro:  no generalized weakness,  no cognitive impairment Psych: non-anxious affect, A and O x 3 Hem/lymph/immuno: no widespread bruising  08/10/22 8:33 pm TCT pt's HC POA, Jesse [name] and left message that I was calling to introduce self and explain role as Palliative NP.  Provided phone number for call back.  Jesse [name]  Thank you for the opportunity to participate in the care of Jesse [name].  The palliative care team will continue to follow. Please call our office at [phone] if we can be of additional assistance.   Jesse [name]  COVID-19 PATIENT SCREENING TOOL Asked and negative response unless otherwise noted:   Have you had symptoms of covid, tested positive or been in contact with someone with symptoms/positive test in the past 5-10 days?  NO

## 2022-08-17 DIAGNOSIS — F413 Other mixed anxiety disorders: Secondary | ICD-10-CM | POA: Diagnosis not present

## 2022-08-17 DIAGNOSIS — F32A Depression, unspecified: Secondary | ICD-10-CM | POA: Diagnosis not present

## 2022-08-17 DIAGNOSIS — F5101 Primary insomnia: Secondary | ICD-10-CM | POA: Diagnosis not present

## 2022-08-30 DIAGNOSIS — G4700 Insomnia, unspecified: Secondary | ICD-10-CM | POA: Diagnosis not present

## 2022-08-30 DIAGNOSIS — Z9189 Other specified personal risk factors, not elsewhere classified: Secondary | ICD-10-CM | POA: Diagnosis not present

## 2022-08-30 DIAGNOSIS — F4310 Post-traumatic stress disorder, unspecified: Secondary | ICD-10-CM | POA: Diagnosis not present

## 2022-08-30 DIAGNOSIS — N3040 Irradiation cystitis without hematuria: Secondary | ICD-10-CM | POA: Diagnosis not present

## 2022-09-06 DIAGNOSIS — N3040 Irradiation cystitis without hematuria: Secondary | ICD-10-CM | POA: Diagnosis not present

## 2022-09-06 DIAGNOSIS — F4310 Post-traumatic stress disorder, unspecified: Secondary | ICD-10-CM | POA: Diagnosis not present

## 2022-09-06 DIAGNOSIS — G4700 Insomnia, unspecified: Secondary | ICD-10-CM | POA: Diagnosis not present

## 2022-09-06 DIAGNOSIS — Z8546 Personal history of malignant neoplasm of prostate: Secondary | ICD-10-CM | POA: Diagnosis not present

## 2022-09-08 DIAGNOSIS — D519 Vitamin B12 deficiency anemia, unspecified: Secondary | ICD-10-CM | POA: Diagnosis not present

## 2022-09-08 DIAGNOSIS — I10 Essential (primary) hypertension: Secondary | ICD-10-CM | POA: Diagnosis not present

## 2022-09-08 DIAGNOSIS — E559 Vitamin D deficiency, unspecified: Secondary | ICD-10-CM | POA: Diagnosis not present

## 2022-11-01 DIAGNOSIS — J302 Other seasonal allergic rhinitis: Secondary | ICD-10-CM | POA: Diagnosis not present

## 2022-11-01 DIAGNOSIS — E785 Hyperlipidemia, unspecified: Secondary | ICD-10-CM | POA: Diagnosis not present

## 2022-11-01 DIAGNOSIS — E559 Vitamin D deficiency, unspecified: Secondary | ICD-10-CM | POA: Diagnosis not present

## 2022-11-01 DIAGNOSIS — I110 Hypertensive heart disease with heart failure: Secondary | ICD-10-CM | POA: Diagnosis not present

## 2022-11-07 ENCOUNTER — Telehealth: Payer: Self-pay | Admitting: Cardiovascular Disease

## 2022-11-07 NOTE — Telephone Encounter (Signed)
  Pt said, he used to see Dr. Excell Seltzer. During pandemic he said, Dr. Excell Seltzer called him and ask how he's doing and since he is doing fine he was told he doesn't need and appt. He said, he still doing fine but he wants to ask Dr. Excell Seltzer if he still need/wants to see him. Last office visit 05/2019

## 2022-11-08 NOTE — Telephone Encounter (Signed)
Called and scheduled pt for visit with Excell Seltzer on 11/21/22. No further questions, no acute issues.

## 2022-11-15 DIAGNOSIS — E782 Mixed hyperlipidemia: Secondary | ICD-10-CM | POA: Diagnosis not present

## 2022-11-21 ENCOUNTER — Ambulatory Visit: Payer: Medicare Other | Attending: Cardiovascular Disease | Admitting: Cardiovascular Disease

## 2022-11-21 ENCOUNTER — Encounter: Payer: Self-pay | Admitting: Cardiovascular Disease

## 2022-11-21 VITALS — BP 120/80 | HR 63 | Ht 72.0 in | Wt 193.4 lb

## 2022-11-21 DIAGNOSIS — I10 Essential (primary) hypertension: Secondary | ICD-10-CM | POA: Diagnosis not present

## 2022-11-21 DIAGNOSIS — I5022 Chronic systolic (congestive) heart failure: Secondary | ICD-10-CM

## 2022-11-21 DIAGNOSIS — I2510 Atherosclerotic heart disease of native coronary artery without angina pectoris: Secondary | ICD-10-CM | POA: Diagnosis not present

## 2022-11-21 DIAGNOSIS — I723 Aneurysm of iliac artery: Secondary | ICD-10-CM | POA: Diagnosis not present

## 2022-11-21 NOTE — Progress Notes (Signed)
Cardiology Office Note:    Date:  11/21/2022   ID:  Jesse [name], DOB [birth year], MRN [id number]  PCP:  Sigmund Hazel, MD   [facility] Providers Cardiologist:  Tonny Bollman, MD     Referring MD: Sigmund Hazel, MD   Chief Complaint  Patient presents with   Coronary Artery Disease    History of Present Illness:    Jesse [name] is a 89 y.o. male with a hx of CAD s/p CABG in 1992, chronic systolic CHF, diffuse aneurysmal disease (thoracic aortic aneurysm, iliac aneurysms, and popliteal artery aneurysms) previously followed by Dr. Arbie Cookey and Dr. Tyrone Sage (now Dr Randie Heinz and Dr Laneta Simmers), left leg venous insufficiency (vein harvesting site). He is now living in a SNF, but is here alone today for follow-up evaluation.   In 2020 he had a mechanical fall and broke his left leg. 2 months later he had another mechanical fall and broke his right leg.  The patient is here alone today.  He came via transportation provided by the nursing facility.  He remains very sharp and is able to provide accurate history.  He denies chest pain or pressure.  He does have mild exertional dyspnea but overall is pretty satisfied with how he is doing.  No orthopnea or PND.  He notes chronic leg edema left greater than right.  He developed some problems related to prostate radiation treatment and has had regular urology follow-up in the interim.  Past Medical History:  Diagnosis Date   AAA (abdominal aortic aneurysm) (HCC)    Arthritis    Cancer (HCC) 2010   prostate ( 40 Txs. of radiation treatments)   Coronary artery disease    Environmental allergies    GERD (gastroesophageal reflux disease)    History of prostate cancer 2010   HOH (hard of hearing)    wears bilateral hearing aids   Hyperlipidemia    Hypertension    Kidney stone 1968   Myocardial infarction (HCC) 1992   Pneumonia    hx of   RBBB     Past Surgical History:  Procedure Laterality Date   ABDOMINAL AORTIC ANEURYSM REPAIR  08-13-1993    Dr Bud Face   CARDIAC CATHETERIZATION     COLONOSCOPY W/ POLYPECTOMY     CORONARY ARTERY BYPASS GRAFT  03-01-1991   Dr. Tyrone Sage   CYSTOSCOPY WITH FULGERATION N/A 01/23/2021   Procedure: Bluford Kaufmann WITH FULGERATION;  Surgeon: Crista Elliot, MD;  Location: [facility];  Service: Urology;  Laterality: N/A;   ESOPHAGOGASTRODUODENOSCOPY (EGD) WITH PROPOFOL N/A 05/04/2018   Procedure: ESOPHAGOGASTRODUODENOSCOPY (EGD) WITH PROPOFOL;  Surgeon: Kerin Salen, MD;  Location: [facility];  Service: Gastroenterology;  Laterality: N/A;   EYE SURGERY     Detached retina (Left eye); bilateral cataract removal   FALSE ANEURYSM REPAIR  05/07/2012   Procedure: REPAIR FALSE ANEURYSM;  Surgeon: Larina Earthly, MD;  Location: [facility];  Service: Vascular;  Laterality: Left;  Repair of Left Femoral Artery Aneurysm   HERNIA REPAIR     HOT HEMOSTASIS N/A 05/04/2018   Procedure: HOT HEMOSTASIS (ARGON PLASMA COAGULATION/BICAP);  Surgeon: Kerin Salen, MD;  Location: [facility];  Service: Gastroenterology;  Laterality: N/A;   INTRAMEDULLARY (IM) NAIL INTERTROCHANTERIC Left 04/16/2019   Procedure: INTRAMEDULLARY (IM) NAIL INTERTROCHANTRIC;  Surgeon: Samson Frederic, MD;  Location: [facility];  Service: Orthopedics;  Laterality: Left;   INTRAMEDULLARY (IM) NAIL INTERTROCHANTERIC Right 06/17/2019   Procedure: RIGHT INTRAMEDULLARY (IM) NAIL INTERTROCHANTRIC;  Surgeon: Samson Frederic, MD;  Location: [facility];  Service: Orthopedics;  Laterality: Right;   IR NASO G TUBE PLC W/FL W/RAD  12/15/2020   ORIF PATELLA Right 06/09/2015   Procedure: OPEN REDUCTION INTERNAL (ORIF) FIXATION PATELLA;  Surgeon: Eldred Manges, MD;  Location: [facility];  Service: Orthopedics;  Laterality: Right;   repair of bowel obstruction  1999   Dr. Derrell Lolling   repair of ventral hernia  04-20-1994   P. Bud Face MD   resection femoral artery  03/19/2012   Dr. Tawanna Cooler Early   SUBMUCOSAL INJECTION  05/04/2018   Procedure: SUBMUCOSAL INJECTION;  Surgeon: Kerin Salen, MD;  Location: [facility];  Service: Gastroenterology;;   TRANSURETHRAL RESECTION OF BLADDER TUMOR N/A 08/18/2019   Procedure: cysto fulgeration clot evacuation;  Surgeon: Malen Gauze, MD;  Location: [facility];  Service: Urology;  Laterality: N/A;    Current Medications: Current Meds  Medication Sig   acetaminophen (TYLENOL) 500 MG tablet Take 500 mg by mouth every 6 (six) hours as needed for mild pain or headache.   alendronate (FOSAMAX) 70 MG tablet Take 70 mg by mouth every Saturday.   carboxymethylcellulose (REFRESH PLUS) 0.5 % SOLN Place 2 drops into both eyes at bedtime.   carvedilol (COREG) 3.125 MG tablet TAKE 1 TABLET BY MOUTH 2 TIMES DAILY WITH A MEAL   fenofibrate 160 MG tablet Take 1 tablet (160 mg total) by mouth daily.   finasteride (PROSCAR) 5 MG tablet Take 5 mg by mouth daily.   Lactobacillus (ACIDOPHILUS PROBIOTIC PO) Take 1 capsule by mouth 2 (two) times daily.   melatonin 3 MG TABS tablet Take 3 mg by mouth at bedtime as needed (sleep).   pantoprazole (PROTONIX) 40 MG tablet Take 40 mg by mouth daily.   polyethylene glycol (MIRALAX / GLYCOLAX) 17 g packet Take 17 g by mouth daily.   sertraline (ZOLOFT) 50 MG tablet Take 50 mg by mouth daily.   simvastatin (ZOCOR) 40 MG tablet Take 1 tablet (40 mg total) by mouth daily at 6 PM.   sodium chloride (OCEAN) 0.65 % SOLN nasal spray Place 2 sprays into both nostrils 2 (two) times daily.   spironolactone (ALDACTONE) 25 MG tablet Take 12.5 mg by mouth daily.   tamsulosin (FLOMAX) 0.4 MG CAPS capsule Take 0.4 mg by mouth at bedtime.     Allergies:   Quinolones and Adhesive [tape]   Social History   Socioeconomic History   Marital status: Widowed    Spouse name: Not on file   Number of children: Not on file   Years of education: Not on file   Highest education level: Not on file  Occupational History   Not on file  Tobacco Use   Smoking status: Former    Years: 20    Types: Cigarettes    Quit date:  07/25/1968    Years since quitting: 54.3   Smokeless tobacco: Never  Vaping Use   Vaping Use: Never used  Substance and Sexual Activity   Alcohol use: No   Drug use: No   Sexual activity: Not on file  Other Topics Concern   Not on file  Social History Narrative   Not on file   Social Determinants of Health   Financial Resource Strain: Not on file  Food Insecurity: Not on file  Transportation Needs: Not on file  Physical Activity: Not on file  Stress: Not on file  Social Connections: Not on file     Family History: The patient's family history includes Cancer  in his father, mother, and sister; Diabetes in his sister; Heart attack in his sister; Varicose Veins in his father.  ROS:   Please see the history of present illness.    All other systems reviewed and are negative.  EKGs/Labs/Other Studies Reviewed:    The following studies were reviewed today: Cardiac Studies & Procedures       ECHOCARDIOGRAM  ECHOCARDIOGRAM COMPLETE 05/25/2018  Narrative *[facility]* [address] [place], [place] [address] [phone]  ------------------------------------------------------------------- Echocardiography  Patient:    Jadarian, Mckay MR #:       [id number] Study Date: 05/25/2018 Gender:     M Age:        84 Height:     182.9 cm Weight:     84.3 kg BSA:        2.08 m^2 Pt. Status: Room:  ATTENDING    Tonny Bollman, MD ORDERING     Tonny Bollman, MD REFERRING    Tonny Bollman, MD SONOGRAPHER  Dewitt Hoes, RDCS PERFORMING   [facility], Outpatient  cc:  ------------------------------------------------------------------- LV EF: 40% -   45%  ------------------------------------------------------------------- Indications:      CHF (I50.22).  ------------------------------------------------------------------- History:   PMH:   Coronary artery disease.  PMH:   Myocardial infarction.  Risk factors:  AAA s/p repair. RBBB.  Hypertension. Dyslipidemia.  ------------------------------------------------------------------- Study Conclusions  - Left ventricle: The cavity size was normal. Wall thickness was normal. Systolic function was mildly to moderately reduced. The estimated ejection fraction was in the range of 40% to 45%. Wall motion was normal; there were no regional wall motion abnormalities. There was an increased relative contribution of atrial contraction to ventricular filling. Doppler parameters are consistent with abnormal left ventricular relaxation (grade 1 diastolic dysfunction). Doppler parameters are consistent with elevated ventricular end-diastolic filling pressure. - Ventricular septum: Septal motion showed paradox. - Mitral valve: Calcified annulus. - Left atrium: The atrium was moderately dilated.  ------------------------------------------------------------------- Labs, prior tests, procedures, and surgery: Transthoracic echocardiography (01/29/2016).     EF was 40%.  Coronary artery bypass grafting.  ------------------------------------------------------------------- Study data:  Strain imaging. Comparison was made to the study of 01/29/2016.  Study status:  Routine.  Procedure:  The patient reported no pain pre or post test. Transthoracic echocardiography. Image quality was adequate.  Study completion:  There were no complications.          Echocardiography.  M-mode, complete 2D, 3D, spectral Doppler, and color Doppler.  Birthdate:  Patient birthdate: Nov 27, 1927.  Age:  Patient is 89 yr old.  Sex:  Gender: male.    BMI: 25.2 kg/m^2.  Blood pressure:     108/60  Patient status:  Outpatient.  Study date:  Study date: 05/25/2018. Study time: 01:17 PM.  Location:  [facility]  -------------------------------------------------------------------  ------------------------------------------------------------------- Left ventricle:  The cavity size was normal. Wall thickness  was normal. Systolic function was mildly to moderately reduced. The estimated ejection fraction was in the range of 40% to 45%. Wall motion was normal; there were no regional wall motion abnormalities. There was an increased relative contribution of atrial contraction to ventricular filling. The duration of atrial flow reversal in the pulmonary vein was increased, suggesting increased left ventricular end-diastolic pressure. Doppler parameters are consistent with abnormal left ventricular relaxation (grade 1 diastolic dysfunction). Doppler parameters are consistent with elevated ventricular end-diastolic filling pressure.  ------------------------------------------------------------------- Aortic valve:   Structurally normal valve. Trileaflet. Cusp separation was normal.  Doppler:  Transvalvular velocity was within the normal  range. There was no stenosis. There was no regurgitation.  ------------------------------------------------------------------- Aorta:  Aortic root: The aortic root was normal in size. Ascending aorta: The ascending aorta was mildly dilated.  ------------------------------------------------------------------- Mitral valve:   Calcified annulus. Leaflet separation was normal. Doppler:  Transvalvular velocity was within the normal range. There was no evidence for stenosis. There was trivial regurgitation. Valve area by pressure half-time: 2.04 cm^2. Indexed valve area by pressure half-time: 0.98 cm^2/m^2.    Peak gradient (D): 3 mm Hg.  ------------------------------------------------------------------- Left atrium:  The atrium was moderately dilated.  ------------------------------------------------------------------- Right ventricle:  The cavity size was normal. Systolic function was normal.  ------------------------------------------------------------------- Ventricular septum:   Septal motion showed  paradox.  ------------------------------------------------------------------- Pulmonic valve:    Structurally normal valve.   Cusp separation was normal.  Doppler:  Transvalvular velocity was within the normal range. There was no regurgitation.  ------------------------------------------------------------------- Tricuspid valve:   Structurally normal valve.   Leaflet separation was normal.  Doppler:  Transvalvular velocity was within the normal range. There was no regurgitation.  ------------------------------------------------------------------- Pulmonary artery:    Systolic pressure could not be accurately estimated.  ------------------------------------------------------------------- Right atrium:  The atrium was normal in size.  ------------------------------------------------------------------- Pericardium:  There was no pericardial effusion.  ------------------------------------------------------------------- Measurements  Left ventricle                         Value          Reference LV ID, ED, PLAX chordal        (H)     54    mm       43 - 52 LV ID, ES, PLAX chordal        (H)     41    mm       23 - 38 LV fx shortening, PLAX chordal (L)     24    %        >=29 LV PW thickness, ED                    10    mm       --------- IVS/LV PW ratio, ED                    1.3            <=1.3 Stroke volume, 2D                      122   ml       --------- Stroke volume/bsa, 2D                  59    ml/m^2   --------- LV e&', lateral                         7.8   cm/s     --------- LV E/e&', lateral                       10.51          --------- LV e&', medial                          4.95  cm/s     --------- LV E/e&', medial  16.57          --------- LV e&', average                         6.38  cm/s     --------- LV E/e&', average                       12.86          ---------  Ventricular septum                     Value          Reference IVS  thickness, ED                      13    mm       ---------  LVOT                                   Value          Reference LVOT ID, S                             25    mm       --------- LVOT area                              4.91  cm^2     --------- LVOT peak velocity, S                  106   cm/s     --------- LVOT mean velocity, S                  76.3  cm/s     --------- LVOT VTI, S                            24.9  cm       ---------  Aorta                                  Value          Reference Aortic root ID, ED                     39    mm       --------- Ascending aorta ID, A-P, S             43    mm       ---------  Left atrium                            Value          Reference LA ID, A-P, ES                         49    mm       --------- LA ID/bsa, A-P                 (H)     2.36  cm/m^2   <=  2.2 LA volume, S                           64    ml       --------- LA volume/bsa, S                       30.8  ml/m^2   --------- LA volume, ES, 1-p A4C                 51.2  ml       --------- LA volume/bsa, ES, 1-p A4C             24.7  ml/m^2   --------- LA volume, ES, 1-p A2C                 66.4  ml       --------- LA volume/bsa, ES, 1-p A2C             32    ml/m^2   ---------  Mitral valve                           Value          Reference Mitral E-wave peak velocity            82    cm/s     --------- Mitral A-wave peak velocity            111   cm/s     --------- Mitral deceleration time       (H)     370   ms       150 - 230 Mitral pressure half-time              108   ms       --------- Mitral peak gradient, D                3     mm Hg    --------- Mitral E/A ratio, peak                 0.7            --------- Mitral valve area, PHT, DP             2.04  cm^2     --------- Mitral valve area/bsa, PHT, DP         0.98  cm^2/m^2 ---------  Systemic veins                         Value          Reference Estimated CVP                          3     mm Hg     ---------  Right ventricle                        Value          Reference TAPSE                                  15.1  mm       --------- RV s&', lateral, S  9.38  cm/s     ---------  Legend: (L)  and  (H)  mark values outside specified reference range.  ------------------------------------------------------------------- Prepared and Electronically Authenticated by  Thurmon Fair, MD 2019-11-01T15:46:12              EKG:  EKG is ordered today.  The ekg ordered today demonstrates normal sinus rhythm 63 bpm, LVH with QRS widening, occasional PACs, age-indeterminate inferior infarct  Recent Labs: No results found for requested labs within last 365 days.  Recent Lipid Panel    Component Value Date/Time   CHOL 106 03/02/2017 0753   TRIG 57 03/02/2017 0753   TRIG 84 05/05/2006 0839   HDL 32 (L) 03/02/2017 0753   CHOLHDL 3.3 03/02/2017 0753   CHOLHDL 4 05/20/2014 0833   VLDL 12.8 05/20/2014 0833   LDLCALC 63 03/02/2017 0753     Risk Assessment/Calculations:                Physical Exam:    VS:  BP 120/80   Pulse 63   Ht 6' (1.829 m)   Wt 193 lb 6.4 oz (87.7 kg)   SpO2 98%   BMI 26.23 kg/m     Wt Readings from Last 3 Encounters:  11/21/22 193 lb 6.4 oz (87.7 kg)  05/04/22 182 lb (82.6 kg)  02/02/22 182 lb (82.6 kg)     GEN:  Well nourished, well developed in no acute distress HEENT: Normal NECK: No JVD; No carotid bruits LYMPHATICS: No lymphadenopathy CARDIAC: RRR, no murmurs, rubs, gallops RESPIRATORY:  Clear to auscultation without rales, wheezing or rhonchi  ABDOMEN: Soft, non-tender, non-distended MUSCULOSKELETAL: 2+ left lower leg edema, 1+ right lower leg edema; No deformity  SKIN: Warm and dry NEUROLOGIC:  Alert and oriented x 3 PSYCHIATRIC:  Normal affect   ASSESSMENT:    1. Chronic systolic heart failure (HCC)   2. Coronary artery disease involving native coronary artery of native heart without angina pectoris   3.  Essential hypertension   4. Iliac artery aneurysm, bilateral (HCC)    PLAN:    In order of problems listed above:  The patient has a history of cardiomyopathy with no recent echo assessment.  He has mild exertional dyspnea and lower extremity edema which is probably related to venous insufficiency.  He essentially describes New York Heart Association functional class II symptoms.  He will continue treatment on carvedilol and spironolactone.  I am going to update an echocardiogram.  Will also check a CBC and metabolic panel. Doing well after remote CABG.  Continue current medical management. Blood pressure is well-controlled on his current medical therapy. The patient has diffuse aneurysmal disease.  He is followed by cardiac and vascular surgery.  He would only want intervention in an emergent situation if absolutely indicated.        Medication Adjustments/Labs and Tests Ordered: Current medicines are reviewed at length with the patient today.  Concerns regarding medicines are outlined above.  Orders Placed This Encounter  Procedures   Lipid panel   Comprehensive metabolic panel   CBC   EKG 12-Lead   ECHOCARDIOGRAM COMPLETE   No orders of the defined types were placed in this encounter.   Patient Instructions  Medication Instructions:  Your physician recommends that you continue on your current medications as directed. Please refer to the Current Medication list given to you today.  *If you need a refill on your cardiac medications before your next appointment, please call your pharmacy*   Lab Work:  CBC, CMET, Lipids (same day as ECHO) If you have labs (blood work) drawn today and your tests are completely normal, you will receive your results only by: MyChart Message (if you have MyChart) OR A paper copy in the mail If you have any lab test that is abnormal or we need to change your treatment, we will call you to review the results.   Testing/Procedures: ECHO Your  physician has requested that you have an echocardiogram. Echocardiography is a painless test that uses sound waves to create images of your heart. It provides your doctor with information about the size and shape of your heart and how well your heart's chambers and valves are working. This procedure takes approximately one hour. There are no restrictions for this procedure. Please do NOT wear cologne, perfume, aftershave, or lotions (deodorant is allowed). Please arrive 15 minutes prior to your appointment time.  Follow-Up: At [facility], you and your health needs are our priority.  As part of our continuing mission to provide you with exceptional heart care, we have created designated Provider Care Teams.  These Care Teams include your primary Cardiologist (physician) and Advanced Practice Providers (APPs -  Physician Assistants and Nurse Practitioners) who all work together to provide you with the care you need, when you need it.  We recommend signing up for the patient portal called "MyChart".  Sign up information is provided on this After Visit Summary.  MyChart is used to connect with patients for Virtual Visits (Telemedicine).  Patients are able to view lab/test results, encounter notes, upcoming appointments, etc.  Non-urgent messages can be sent to your provider as well.   To learn more about what you can do with MyChart, go to [email or web address].    Your next appointment:   1 year(s)  Provider:   Tonny Bollman, MD        Signed, Tonny Bollman, MD  11/21/2022 5:35 PM    [facility]

## 2022-11-21 NOTE — Patient Instructions (Signed)
Medication Instructions:  Your physician recommends that you continue on your current medications as directed. Please refer to the Current Medication list given to you today.  *If you need a refill on your cardiac medications before your next appointment, please call your pharmacy*   Lab Work: CBC, CMET, Lipids (same day as ECHO) If you have labs (blood work) drawn today and your tests are completely normal, you will receive your results only by: MyChart Message (if you have MyChart) OR A paper copy in the mail If you have any lab test that is abnormal or we need to change your treatment, we will call you to review the results.   Testing/Procedures: ECHO Your physician has requested that you have an echocardiogram. Echocardiography is a painless test that uses sound waves to create images of your heart. It provides your doctor with information about the size and shape of your heart and how well your heart's chambers and valves are working. This procedure takes approximately one hour. There are no restrictions for this procedure. Please do NOT wear cologne, perfume, aftershave, or lotions (deodorant is allowed). Please arrive 15 minutes prior to your appointment time.  Follow-Up: At [facility], you and your health needs are our priority.  As part of our continuing mission to provide you with exceptional heart care, we have created designated Provider Care Teams.  These Care Teams include your primary Cardiologist (physician) and Advanced Practice Providers (APPs -  Physician Assistants and Nurse Practitioners) who all work together to provide you with the care you need, when you need it.  We recommend signing up for the patient portal called "MyChart".  Sign up information is provided on this After Visit Summary.  MyChart is used to connect with patients for Virtual Visits (Telemedicine).  Patients are able to view lab/test results, encounter notes, upcoming appointments, etc.   Non-urgent messages can be sent to your provider as well.   To learn more about what you can do with MyChart, go to [email or web address].    Your next appointment:   1 year(s)  Provider:   Tonny Bollman, MD

## 2022-11-28 ENCOUNTER — Encounter: Payer: Self-pay | Admitting: Family Medicine

## 2022-11-28 ENCOUNTER — Non-Acute Institutional Stay: Payer: Medicare Other | Admitting: Family Medicine

## 2022-11-28 VITALS — BP 120/80 | HR 74 | Temp 97.5°F | Resp 16

## 2022-11-28 DIAGNOSIS — I723 Aneurysm of iliac artery: Secondary | ICD-10-CM

## 2022-11-28 DIAGNOSIS — I714 Abdominal aortic aneurysm, without rupture, unspecified: Secondary | ICD-10-CM

## 2022-11-28 DIAGNOSIS — I5022 Chronic systolic (congestive) heart failure: Secondary | ICD-10-CM

## 2022-11-28 DIAGNOSIS — I724 Aneurysm of artery of lower extremity: Secondary | ICD-10-CM

## 2022-11-28 NOTE — Progress Notes (Signed)
Therapist, nutritional Palliative Care Consult Note Telephone: [phone]  Fax: [phone]   Date of encounter: 11/28/22 12:01 PM PATIENT NAME: Jesse [name] At [facility] [address] [place] [address]   [phone] (home)  DOB: 01-06-1928 MRN: [id number] PRIMARY CARE PROVIDER:    Sigmund Hazel, MD,  [address] [place] [address] [phone]  REFERRING PROVIDER:   Sigmund Hazel, MD [address] [place],  [place] [address] [phone]  Emergency Contact:    Contact Information     Name Relation Home Work Mobile   [place] Son [phone]  [phone]   Santiago Bumpers Daughter [phone]  [phone]       Health Care POA/Health Care Agent   I met face to face with patient in [facility] facility. Palliative Care was asked to follow this patient by consultation request of Sigmund Hazel, MD to address advance care planning and complex medical decision making. This is a follow up visit.  Goals of Care: The value and importance of advance care planning-has advance directive Living will designates that [facility] POA can authorize withholding or withdrawal of life sustaining procedures when and if he is terminally ill, persistently in a coma, suffer severe dementia or is in a persistent vegetative state. Living will also expresses his wish not to have his life prolonged with life sustaining procedures in these situation. Health care POA is Grahame Perezmartinez as wife Julieanne Cotton is deceased.  Review of an advance directive document-DNR.  CODE STATUS: DNR   ASSESSMENT AND / RECOMMENDATIONS:  PPS: 70%  Chronic systolic heart failure Euvolemic, stable. Continue current management as recommended by Cardiologist. Last lipids in system show good control and only HDL mildly low at 37, noted in 2018.  2.  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 Only intervention for catastrophic event, annual surveillance. No evidence of dissection/bleeding.   Follow up Palliative Care Visit:  Palliative Care continuing to follow up by monitoring for changes in appetite, weight, functional and cognitive status for chronic disease progression and management in agreement with patient's stated goals of care. Next visit in 6-8 weeks or prn.  This visit was coded based on medical decision making (MDM).  Chief Complaint  Palliative Care is continuing to follow pt for chronic medical management with chronic systolic heart failure and multi-vessel aneurysms.  HISTORY OF PRESENT ILLNESS: Jesse [name] is a 89 y.o. year old male with chronic systolic heart failure and multivessel aneurysms.  He was recently seen by Cardiologist Dr Tonny Bollman on 11/21/22. He is also followed by CVTS for diffuse aneurysmal disease by Dr's Randie Heinz and [facility] with only requested intervention for emergent situation.  Vein was harvested from his left leg for his CABG and he has venous insufficiency. EKG in the office showed NSR with rate 63 bpm, LVH with QRS widening, occasional PACs and age indeterminate inferior infarct. He assessed pt as NHYA Class II and recommended continuing Carvedilol and Spironolactone. BP controlled.  Cardiology requesting updated echo (scheduled 12/20/22 at 2 PM), CBC, lipid panel and CMP.    ACTIVITIES OF DAILY LIVING: CONTINENT OF BLADDER/BOWEL? 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   Gross hematuria 01/23/2021   Palliative care by specialist    SBO (small bowel obstruction) (HCC) 12/12/2020   Abducens (sixth) nerve palsy, left 02/20/2020   Binocular vision disorder with diplopia 02/20/2020   History of prostate cancer 08/15/2019   Anxiety 06/21/2019   Anemia due to chronic illness 06/20/2019   Pneumonia due to COVID-19 virus 06/20/2019   Displaced spiral fracture of shaft of right femur, initial encounter for open fracture type I or II (HCC) 06/20/2019   COVID-19 virus infection 06/19/2019   Goals of care, counseling/discussion    Hip fracture (HCC) 04/15/2019   Fall    Coronary artery disease involving native coronary artery of native heart without angina pectoris 11/20/2018   Thoracic aortic aneurysm without rupture (HCC) 11/20/2018   Chronic systolic heart failure (HCC) 11/20/2018   Duodenal ulcer with hemorrhage 05/05/2018   Hemorrhagic shock (HCC) 05/05/2018   AKI (acute kidney injury) (HCC) 05/05/2018   Orthostatic hypotension 05/04/2018   Anemia associated with acute blood loss 05/04/2018   Melena 05/04/2018   GI bleed 05/04/2018   Patella fracture 06/09/2015   Aftercare following surgery of the circulatory system, NEC 01/08/2013   Pain in limb- Left popliteal 12/25/2012   Aneurysm artery, femoral (HCC) 05/22/2012   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   Aneurysm of abdominal vessel (HCC) 02/21/2012   Iliac artery aneurysm, bilateral (HCC) 08/09/2011   Aneurysm of artery of lower extremity (HCC) 08/09/2011   HLD (hyperlipidemia) 01/16/2009   Essential hypertension 01/16/2009   CAD, ARTERY BYPASS GRAFT 01/16/2009   PERIPHERAL VASCULAR DISEASE 01/16/2009   ABDOMINAL AORTIC ANEURYSM REPAIR, HX OF 01/16/2009   Mixed hyperlipidemia 12/28/2008   PAST MEDICAL HISTORY:  Active Ambulatory Problems    Diagnosis Date Noted    Mixed hyperlipidemia 12/28/2008   HLD (hyperlipidemia) 01/16/2009   Essential hypertension 01/16/2009   CAD, ARTERY BYPASS GRAFT 01/16/2009   PERIPHERAL VASCULAR DISEASE 01/16/2009   ABDOMINAL AORTIC ANEURYSM REPAIR, HX OF 01/16/2009   Iliac artery aneurysm, bilateral (HCC) 08/09/2011   Aneurysm of artery of lower extremity (HCC) 08/09/2011   Aneurysm of abdominal vessel (HCC) 02/21/2012   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   Aneurysm artery, femoral (HCC) 05/22/2012   Pain in limb- Left popliteal 12/25/2012   Aftercare following surgery of the circulatory system, NEC 01/08/2013   Patella fracture 06/09/2015   Orthostatic hypotension 05/04/2018   Anemia associated with acute blood loss 05/04/2018   Melena 05/04/2018   GI bleed 05/04/2018   Duodenal ulcer with hemorrhage 05/05/2018   Hemorrhagic shock (HCC) 05/05/2018   AKI (acute kidney injury) (HCC) 05/05/2018   Coronary artery disease involving native coronary artery of native heart without angina pectoris 11/20/2018   Thoracic aortic aneurysm without rupture (HCC) 11/20/2018   Chronic systolic heart failure (HCC) 11/20/2018   Hip fracture (HCC) 04/15/2019   Fall    Goals of care, counseling/discussion    COVID-19 virus infection 06/19/2019   Anemia due to chronic illness 06/20/2019   Pneumonia due to COVID-19 virus 06/20/2019   Displaced spiral fracture of shaft of right femur, initial encounter for open fracture type I or II (HCC) 06/20/2019   Anxiety 06/21/2019   History of prostate cancer 08/15/2019   Abducens (sixth)  nerve palsy, left 02/20/2020   Binocular vision disorder with diplopia 02/20/2020   SBO (small bowel obstruction) (HCC) 12/12/2020   Palliative care by specialist    Gross hematuria 01/23/2021   GERD without esophagitis 02/15/2021   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   Hematuria 02/19/2021   Palliative care encounter 05/04/2022   At risk for excessive bleeding 05/04/2022   At risk for falling  08/10/2022   Resolved Ambulatory Problems    Diagnosis Date Noted   Hematuria 01/22/2021   Past Medical History:  Diagnosis Date   AAA (abdominal aortic aneurysm) (HCC)    Arthritis    Cancer (HCC) 2010   Coronary artery disease    Environmental allergies    GERD (gastroesophageal reflux disease)    HOH (hard of hearing)    Hyperlipidemia    Hypertension    Kidney stone 1968   Myocardial infarction (HCC) 1992   Pneumonia    RBBB    SOCIAL HX:  Social History   Tobacco Use   Smoking status: Former    Years: 20    Types: Cigarettes    Quit date: 07/25/1968    Years since quitting: 54.3   Smokeless tobacco: Never  Substance Use Topics   Alcohol use: No   FAMILY HX:  Family History  Problem Relation Age of Onset   Cancer Mother    Cancer Father    Varicose Veins Father    Cancer Sister    Diabetes Sister    Heart attack Sister        Preferred Pharmacy: ALLERGIES:  Allergies  Allergen Reactions   Quinolones Other (See Comments)    Unknown reaction   Adhesive [Tape] Itching and Rash     PERTINENT MEDICATIONS:  Outpatient Encounter Medications as of 11/28/2022  Medication Sig   acetaminophen (TYLENOL) 500 MG tablet Take 500 mg by mouth every 6 (six) hours as needed for mild pain or headache.   alendronate (FOSAMAX) 70 MG tablet Take 70 mg by mouth every Saturday.   carboxymethylcellulose (REFRESH PLUS) 0.5 % SOLN Place 2 drops into both eyes at bedtime.   carvedilol (COREG) 3.125 MG tablet TAKE 1 TABLET BY MOUTH 2 TIMES DAILY WITH A MEAL   fenofibrate 160 MG tablet Take 1 tablet (160 mg total) by mouth daily.   finasteride (PROSCAR) 5 MG tablet Take 5 mg by mouth daily.   Lactobacillus (ACIDOPHILUS PROBIOTIC PO) Take 1 capsule by mouth 2 (two) times daily.   melatonin 3 MG TABS tablet Take 3 mg by mouth at bedtime as needed (sleep).   pantoprazole (PROTONIX) 40 MG tablet Take 40 mg by mouth daily.   polyethylene glycol (MIRALAX / GLYCOLAX) 17 g packet Take 17  g by mouth daily.   sertraline (ZOLOFT) 50 MG tablet Take 50 mg by mouth daily.   simvastatin (ZOCOR) 40 MG tablet Take 1 tablet (40 mg total) by mouth daily at 6 PM.   sodium chloride (OCEAN) 0.65 % SOLN nasal spray Place 2 sprays into both nostrils 2 (two) times daily.   spironolactone (ALDACTONE) 25 MG tablet Take 12.5 mg by mouth daily.   tamsulosin (FLOMAX) 0.4 MG CAPS capsule Take 0.4 mg by mouth at bedtime.   No facility-administered encounter medications on file as of 11/28/2022.    History obtained from review of EMR, discussion with facility staff/caregiver and/or patient.      I reviewed available labs, medications, imaging, studies and related documents from the EMR.  There were no new records/imaging since last  visit. Records reviewed and summarized above.   Physical Exam: GENERAL: NAD LUNGS: CTAB, no increased work of breathing, room air CARDIAC:  S1S2, RRR with no MRG, Trace pedal edema left/none on right, No cyanosis ABD:  Normo-active BS x 4 quads, soft, non-tender EXTREMITIES: Normal ROM, no deformity, strength equal, No muscle atrophy/subcutaneous fat loss NEURO:  No weakness or cognitive impairment  PSYCH:  non-anxious affect, A & O x 3  Thank you for the opportunity to participate in the care of Jesse [name]. Please call our main office at [phone] if we can be of additional assistance.    Joycelyn Man FNP-C  [email or web address] .[facility]  Phone:  [phone]

## 2022-11-29 DIAGNOSIS — Z9189 Other specified personal risk factors, not elsewhere classified: Secondary | ICD-10-CM | POA: Diagnosis not present

## 2022-11-29 DIAGNOSIS — M810 Age-related osteoporosis without current pathological fracture: Secondary | ICD-10-CM | POA: Diagnosis not present

## 2022-11-29 DIAGNOSIS — R0981 Nasal congestion: Secondary | ICD-10-CM | POA: Diagnosis not present

## 2022-11-29 DIAGNOSIS — K219 Gastro-esophageal reflux disease without esophagitis: Secondary | ICD-10-CM | POA: Diagnosis not present

## 2022-12-07 DIAGNOSIS — F5101 Primary insomnia: Secondary | ICD-10-CM | POA: Diagnosis not present

## 2022-12-07 DIAGNOSIS — F413 Other mixed anxiety disorders: Secondary | ICD-10-CM | POA: Diagnosis not present

## 2022-12-07 DIAGNOSIS — F32A Depression, unspecified: Secondary | ICD-10-CM | POA: Diagnosis not present

## 2022-12-14 DIAGNOSIS — Z9189 Other specified personal risk factors, not elsewhere classified: Secondary | ICD-10-CM | POA: Diagnosis not present

## 2022-12-14 DIAGNOSIS — K219 Gastro-esophageal reflux disease without esophagitis: Secondary | ICD-10-CM | POA: Diagnosis not present

## 2022-12-14 DIAGNOSIS — R0981 Nasal congestion: Secondary | ICD-10-CM | POA: Diagnosis not present

## 2022-12-14 DIAGNOSIS — M810 Age-related osteoporosis without current pathological fracture: Secondary | ICD-10-CM | POA: Diagnosis not present

## 2022-12-15 DIAGNOSIS — I10 Essential (primary) hypertension: Secondary | ICD-10-CM | POA: Diagnosis not present

## 2022-12-16 DIAGNOSIS — C61 Malignant neoplasm of prostate: Secondary | ICD-10-CM | POA: Diagnosis not present

## 2022-12-16 DIAGNOSIS — N3041 Irradiation cystitis with hematuria: Secondary | ICD-10-CM | POA: Diagnosis not present

## 2022-12-16 DIAGNOSIS — R3914 Feeling of incomplete bladder emptying: Secondary | ICD-10-CM | POA: Diagnosis not present

## 2022-12-20 ENCOUNTER — Ambulatory Visit: Payer: Medicare Other

## 2022-12-20 ENCOUNTER — Ambulatory Visit (HOSPITAL_COMMUNITY): Payer: Medicare Other | Attending: Internal Medicine

## 2022-12-20 DIAGNOSIS — I723 Aneurysm of iliac artery: Secondary | ICD-10-CM | POA: Diagnosis not present

## 2022-12-20 DIAGNOSIS — I5022 Chronic systolic (congestive) heart failure: Secondary | ICD-10-CM | POA: Diagnosis not present

## 2022-12-20 DIAGNOSIS — I2510 Atherosclerotic heart disease of native coronary artery without angina pectoris: Secondary | ICD-10-CM | POA: Insufficient documentation

## 2022-12-20 DIAGNOSIS — I10 Essential (primary) hypertension: Secondary | ICD-10-CM | POA: Diagnosis not present

## 2022-12-20 LAB — ECHOCARDIOGRAM COMPLETE
Area-P 1/2: 2.71 cm2
S' Lateral: 3.5 cm

## 2022-12-21 LAB — COMPREHENSIVE METABOLIC PANEL
ALT: 15 IU/L (ref 0–44)
AST: 16 IU/L (ref 0–40)
Albumin/Globulin Ratio: 2.2 (ref 1.2–2.2)
Albumin: 4 g/dL (ref 3.6–4.6)
Alkaline Phosphatase: 40 IU/L — ABNORMAL LOW (ref 44–121)
BUN/Creatinine Ratio: 26 — ABNORMAL HIGH (ref 10–24)
BUN: 26 mg/dL (ref 10–36)
Bilirubin Total: 0.5 mg/dL (ref 0.0–1.2)
CO2: 22 mmol/L (ref 20–29)
Calcium: 9.7 mg/dL (ref 8.6–10.2)
Chloride: 103 mmol/L (ref 96–106)
Creatinine, Ser: 1.01 mg/dL (ref 0.76–1.27)
Globulin, Total: 1.8 g/dL (ref 1.5–4.5)
Glucose: 105 mg/dL — ABNORMAL HIGH (ref 70–99)
Potassium: 4.4 mmol/L (ref 3.5–5.2)
Sodium: 137 mmol/L (ref 134–144)
Total Protein: 5.8 g/dL — ABNORMAL LOW (ref 6.0–8.5)
eGFR: 70 mL/min/{1.73_m2} (ref >59)

## 2022-12-21 LAB — CBC
Hematocrit: 35.9 % — ABNORMAL LOW (ref 37.5–51.0)
Hemoglobin: 12 g/dL — ABNORMAL LOW (ref 13.0–17.7)
MCH: 29.6 pg (ref 26.6–33.0)
MCHC: 33.4 g/dL (ref 31.5–35.7)
MCV: 89 fL (ref 79–97)
Platelets: 238 10*3/uL (ref 150–450)
RBC: 4.05 x10E6/uL — ABNORMAL LOW (ref 4.14–5.80)
RDW: 12.9 % (ref 11.6–15.4)
WBC: 5.1 10*3/uL (ref 3.4–10.8)

## 2022-12-21 LAB — LIPID PANEL
Chol/HDL Ratio: 3.9 ratio (ref 0.0–5.0)
Cholesterol, Total: 126 mg/dL (ref 100–199)
HDL: 32 mg/dL — ABNORMAL LOW (ref >39)
LDL Chol Calc (NIH): 77 mg/dL (ref 0–99)
Triglycerides: 87 mg/dL (ref 0–149)
VLDL Cholesterol Cal: 17 mg/dL (ref 5–40)

## 2023-01-31 DIAGNOSIS — F4310 Post-traumatic stress disorder, unspecified: Secondary | ICD-10-CM | POA: Diagnosis not present

## 2023-01-31 DIAGNOSIS — I25810 Atherosclerosis of coronary artery bypass graft(s) without angina pectoris: Secondary | ICD-10-CM | POA: Diagnosis not present

## 2023-01-31 DIAGNOSIS — Z8719 Personal history of other diseases of the digestive system: Secondary | ICD-10-CM | POA: Diagnosis not present

## 2023-01-31 DIAGNOSIS — E785 Hyperlipidemia, unspecified: Secondary | ICD-10-CM | POA: Diagnosis not present

## 2023-02-28 DIAGNOSIS — I5030 Unspecified diastolic (congestive) heart failure: Secondary | ICD-10-CM | POA: Diagnosis not present

## 2023-02-28 DIAGNOSIS — L03116 Cellulitis of left lower limb: Secondary | ICD-10-CM | POA: Diagnosis not present

## 2023-02-28 DIAGNOSIS — Z9889 Other specified postprocedural states: Secondary | ICD-10-CM | POA: Diagnosis not present

## 2023-03-07 DIAGNOSIS — L309 Dermatitis, unspecified: Secondary | ICD-10-CM | POA: Diagnosis not present

## 2023-03-07 DIAGNOSIS — L03116 Cellulitis of left lower limb: Secondary | ICD-10-CM | POA: Diagnosis not present

## 2023-03-07 DIAGNOSIS — Z9889 Other specified postprocedural states: Secondary | ICD-10-CM | POA: Diagnosis not present

## 2023-03-07 DIAGNOSIS — R600 Localized edema: Secondary | ICD-10-CM | POA: Diagnosis not present

## 2023-03-17 DIAGNOSIS — I110 Hypertensive heart disease with heart failure: Secondary | ICD-10-CM | POA: Diagnosis not present

## 2023-03-17 DIAGNOSIS — L309 Dermatitis, unspecified: Secondary | ICD-10-CM | POA: Diagnosis not present

## 2023-03-17 DIAGNOSIS — R600 Localized edema: Secondary | ICD-10-CM | POA: Diagnosis not present

## 2023-03-17 DIAGNOSIS — I509 Heart failure, unspecified: Secondary | ICD-10-CM | POA: Diagnosis not present

## 2023-03-29 DIAGNOSIS — I509 Heart failure, unspecified: Secondary | ICD-10-CM | POA: Diagnosis not present

## 2023-03-29 DIAGNOSIS — R21 Rash and other nonspecific skin eruption: Secondary | ICD-10-CM | POA: Diagnosis not present

## 2023-03-29 DIAGNOSIS — L309 Dermatitis, unspecified: Secondary | ICD-10-CM | POA: Diagnosis not present

## 2023-03-29 DIAGNOSIS — Z9889 Other specified postprocedural states: Secondary | ICD-10-CM | POA: Diagnosis not present

## 2023-05-22 DIAGNOSIS — K080 Exfoliation of teeth due to systemic causes: Secondary | ICD-10-CM | POA: Diagnosis not present

## 2023-06-01 DIAGNOSIS — K080 Exfoliation of teeth due to systemic causes: Secondary | ICD-10-CM | POA: Diagnosis not present

## 2023-06-08 DIAGNOSIS — C61 Malignant neoplasm of prostate: Secondary | ICD-10-CM | POA: Diagnosis not present

## 2023-06-15 DIAGNOSIS — N3041 Irradiation cystitis with hematuria: Secondary | ICD-10-CM | POA: Diagnosis not present

## 2023-06-15 DIAGNOSIS — R350 Frequency of micturition: Secondary | ICD-10-CM | POA: Diagnosis not present

## 2023-06-15 DIAGNOSIS — Z8546 Personal history of malignant neoplasm of prostate: Secondary | ICD-10-CM | POA: Diagnosis not present

## 2023-06-15 DIAGNOSIS — N3946 Mixed incontinence: Secondary | ICD-10-CM | POA: Diagnosis not present

## 2023-06-29 DIAGNOSIS — K080 Exfoliation of teeth due to systemic causes: Secondary | ICD-10-CM | POA: Diagnosis not present

## 2023-07-11 DIAGNOSIS — H52223 Regular astigmatism, bilateral: Secondary | ICD-10-CM | POA: Diagnosis not present

## 2023-07-11 DIAGNOSIS — H01021 Squamous blepharitis right upper eyelid: Secondary | ICD-10-CM | POA: Diagnosis not present

## 2023-07-11 DIAGNOSIS — Z961 Presence of intraocular lens: Secondary | ICD-10-CM | POA: Diagnosis not present

## 2023-07-11 DIAGNOSIS — H16223 Keratoconjunctivitis sicca, not specified as Sjogren's, bilateral: Secondary | ICD-10-CM | POA: Diagnosis not present

## 2023-07-11 DIAGNOSIS — H01024 Squamous blepharitis left upper eyelid: Secondary | ICD-10-CM | POA: Diagnosis not present

## 2023-07-11 DIAGNOSIS — H5203 Hypermetropia, bilateral: Secondary | ICD-10-CM | POA: Diagnosis not present

## 2023-07-11 DIAGNOSIS — H524 Presbyopia: Secondary | ICD-10-CM | POA: Diagnosis not present

## 2023-09-05 DIAGNOSIS — I517 Cardiomegaly: Secondary | ICD-10-CM | POA: Diagnosis not present

## 2023-09-05 DIAGNOSIS — R0989 Other specified symptoms and signs involving the circulatory and respiratory systems: Secondary | ICD-10-CM | POA: Diagnosis not present

## 2023-09-05 DIAGNOSIS — I5030 Unspecified diastolic (congestive) heart failure: Secondary | ICD-10-CM | POA: Diagnosis not present

## 2023-09-05 DIAGNOSIS — R0602 Shortness of breath: Secondary | ICD-10-CM | POA: Diagnosis not present

## 2023-09-05 DIAGNOSIS — J984 Other disorders of lung: Secondary | ICD-10-CM | POA: Diagnosis not present

## 2023-09-05 DIAGNOSIS — I25810 Atherosclerosis of coronary artery bypass graft(s) without angina pectoris: Secondary | ICD-10-CM | POA: Diagnosis not present

## 2023-09-12 DIAGNOSIS — I5030 Unspecified diastolic (congestive) heart failure: Secondary | ICD-10-CM | POA: Diagnosis not present

## 2023-09-12 DIAGNOSIS — I110 Hypertensive heart disease with heart failure: Secondary | ICD-10-CM | POA: Diagnosis not present

## 2023-09-12 DIAGNOSIS — R0989 Other specified symptoms and signs involving the circulatory and respiratory systems: Secondary | ICD-10-CM | POA: Diagnosis not present

## 2023-09-12 DIAGNOSIS — J189 Pneumonia, unspecified organism: Secondary | ICD-10-CM | POA: Diagnosis not present

## 2023-09-28 DIAGNOSIS — J189 Pneumonia, unspecified organism: Secondary | ICD-10-CM | POA: Diagnosis not present

## 2023-09-28 DIAGNOSIS — I517 Cardiomegaly: Secondary | ICD-10-CM | POA: Diagnosis not present

## 2023-10-02 DIAGNOSIS — I517 Cardiomegaly: Secondary | ICD-10-CM | POA: Diagnosis not present

## 2023-10-02 DIAGNOSIS — J189 Pneumonia, unspecified organism: Secondary | ICD-10-CM | POA: Diagnosis not present

## 2023-11-16 ENCOUNTER — Ambulatory Visit: Payer: Medicare Other | Attending: Cardiovascular Disease | Admitting: Cardiovascular Disease

## 2023-11-16 ENCOUNTER — Encounter: Payer: Self-pay | Admitting: Cardiovascular Disease

## 2023-11-16 VITALS — BP 102/54 | HR 65 | Ht 72.0 in | Wt 193.8 lb

## 2023-11-16 DIAGNOSIS — I2510 Atherosclerotic heart disease of native coronary artery without angina pectoris: Secondary | ICD-10-CM

## 2023-11-16 DIAGNOSIS — I5022 Chronic systolic (congestive) heart failure: Secondary | ICD-10-CM | POA: Diagnosis not present

## 2023-11-16 DIAGNOSIS — I10 Essential (primary) hypertension: Secondary | ICD-10-CM | POA: Diagnosis not present

## 2023-11-16 DIAGNOSIS — I723 Aneurysm of iliac artery: Secondary | ICD-10-CM | POA: Diagnosis not present

## 2023-11-16 DIAGNOSIS — E782 Mixed hyperlipidemia: Secondary | ICD-10-CM

## 2023-11-16 NOTE — Assessment & Plan Note (Signed)
 He remains asymptomatic with respect to his coronary artery disease.

## 2023-11-16 NOTE — Assessment & Plan Note (Signed)
 Followed by vascular surgery with no intervention planned.

## 2023-11-16 NOTE — Assessment & Plan Note (Signed)
 Treated with simvastatin  last lipids with cholesterol 126, HDL 32, LDL 77.  Considering his very advanced age of 89 years old, we will keep him on his current medicines which he is tolerating well.

## 2023-11-16 NOTE — Assessment & Plan Note (Signed)
 Blood pressure is well-controlled.  Treated with carvedilol  and spironolactone .

## 2023-11-16 NOTE — Progress Notes (Signed)
 Cardiology Office Note:    Date:  11/16/2023   ID:  SHAFTER JUPIN, DOB Apr 28, 1928, MRN [id number]  PCP:  Perley Bradley, MD   [facility] Providers Cardiologist:  Arnoldo Lapping, MD     Referring MD: Perley Bradley, MD   Chief Complaint  Patient presents with   Coronary Artery Disease    History of Present Illness:    Jesse [name] is a 89 y.o. male with a hx of CAD s/p CABG in 1992, chronic systolic CHF, diffuse aneurysmal disease (thoracic aortic aneurysm, iliac aneurysms, and popliteal artery aneurysms) previously followed by Dr. Shirley Douglas and Dr. Nicanor Barge (now Dr Vikki Graves and Dr Sherene Dilling), left leg venous insufficiency (vein harvesting site). He lives in Assisted Living, but is here alone today for follow-up evaluation.   He reports that he is doing pretty well.  He still is ambulatory and functionally independent.  He broke both of his legs in 2020 within 2 months of 1 another, both related to mechanical falls.  He uses a walker and has not had any other major falls or injuries since that time.  He denies chest pain, chest pressure, orthopnea, or PND.  He does have mild exertional dyspnea and chronic lower extremity edema, both of which are unchanged over time.  He denies lightheadedness or syncope.  He reports no recent changes in his medications.   Current Medications: Current Meds  Medication Sig   acetaminophen  (TYLENOL ) 500 MG tablet Take 500 mg by mouth every 6 (six) hours as needed for mild pain or headache.   alendronate (FOSAMAX) 70 MG tablet Take 70 mg by mouth every Saturday.   carboxymethylcellulose (REFRESH PLUS) 0.5 % SOLN Place 2 drops into both eyes at bedtime.   carvedilol  (COREG ) 3.125 MG tablet TAKE 1 TABLET BY MOUTH 2 TIMES DAILY WITH A MEAL   fenofibrate  160 MG tablet Take 1 tablet (160 mg total) by mouth daily.   finasteride  (PROSCAR ) 5 MG tablet Take 5 mg by mouth daily.   Lactobacillus (ACIDOPHILUS PROBIOTIC PO) Take 1 capsule by mouth 2 (two) times daily.    melatonin 3 MG TABS tablet Take 3 mg by mouth at bedtime as needed (sleep).   pantoprazole  (PROTONIX ) 40 MG tablet Take 40 mg by mouth daily.   polyethylene glycol (MIRALAX  / GLYCOLAX ) 17 g packet Take 17 g by mouth daily.   sertraline  (ZOLOFT ) 50 MG tablet Take 50 mg by mouth daily.   simvastatin  (ZOCOR ) 40 MG tablet Take 1 tablet (40 mg total) by mouth daily at 6 PM.   sodium chloride  (OCEAN) 0.65 % SOLN nasal spray Place 2 sprays into both nostrils 2 (two) times daily.   spironolactone  (ALDACTONE ) 25 MG tablet Take 12.5 mg by mouth daily.   tamsulosin  (FLOMAX ) 0.4 MG CAPS capsule Take 0.4 mg by mouth at bedtime.     Allergies:   Quinolones and Adhesive [tape]   ROS:   Please see the history of present illness.    All other systems reviewed and are negative.  EKGs/Labs/Other Studies Reviewed:    The following studies were reviewed today: Cardiac Studies & Procedures   ______________________________________________________________________________________________     ECHOCARDIOGRAM  ECHOCARDIOGRAM COMPLETE 12/20/2022  Narrative ECHOCARDIOGRAM REPORT    Patient Name:   Jesse [name] [facility]  Date of Exam: 12/20/2022 Medical Rec #:  [id number]     Height:       72.0 in Accession #:    [id number]    Weight:  193.4 lb Date of Birth:  1927-09-14     BSA:          2.100 m Patient Age:    89 years      BP:           111/77 mmHg Patient Gender: M             HR:           56 bpm. Exam Location:  [address]  Procedure: 2D Echo, Cardiac Doppler and Color Doppler  Indications:     I50.22 CHF  History:         Patient has prior history of Echocardiogram examinations, most recent 05/25/2018. CAD and Previous Myocardial Infarction, Prior CABG, Arrythmias:RBBB; Risk Factors:Hypertension and HLD. AAA repair.  Sonographer:     Juventino Oppenheim RCS Referring Phys:  [phone] Kerrilynn Derenzo Diagnosing Phys: Dinah Franco MD  IMPRESSIONS   1. Left ventricular ejection fraction, by estimation,  is 40 to 45%. Left ventricular ejection fraction by PLAX is 42 %. The left ventricle has mildly decreased function. The left ventricle demonstrates regional wall motion abnormalities (see scoring diagram/findings for description). There is moderate asymmetric left ventricular hypertrophy of the basal-septal segment. Left ventricular diastolic parameters are consistent with Grade I diastolic dysfunction (impaired relaxation). There is incoordinate septal motion. There is severe akinesis of the left ventricular, basal-mid inferior wall. 2. Right ventricular systolic function is mildly reduced. The right ventricular size is normal. There is normal pulmonary artery systolic pressure. 3. Left atrial size was moderately dilated. 4. The mitral valve is abnormal. Trivial mitral valve regurgitation. 5. The aortic valve is tricuspid. Aortic valve regurgitation is not visualized. 6. Aortic dilatation noted. There is mild dilatation of the ascending aorta, measuring 41 mm.  Comparison(s): Changes from prior study are noted. 05/25/2018: LVEF 40-45%.  FINDINGS Left Ventricle: Left ventricular ejection fraction, by estimation, is 40 to 45%. Left ventricular ejection fraction by PLAX is 42 %. The left ventricle has mildly decreased function. The left ventricle demonstrates regional wall motion abnormalities. Severe akinesis of the left ventricular, basal-mid inferior wall. The left ventricular internal cavity size was normal in size. There is moderate asymmetric left ventricular hypertrophy of the basal-septal segment. Incoordinate septal motion. Left ventricular diastolic parameters are consistent with Grade I diastolic dysfunction (impaired relaxation). Indeterminate filling pressures.   LV Wall Scoring: The posterior wall is akinetic.  Right Ventricle: The right ventricular size is normal. No increase in right ventricular wall thickness. Right ventricular systolic function is mildly reduced. There is normal  pulmonary artery systolic pressure. The tricuspid regurgitant velocity is 2.12 m/s, and with an assumed right atrial pressure of 3 mmHg, the estimated right ventricular systolic pressure is 21.0 mmHg.  Left Atrium: Left atrial size was moderately dilated.  Right Atrium: Right atrial size was normal in size.  Pericardium: There is no evidence of pericardial effusion. Presence of epicardial fat layer.  Mitral Valve: The mitral valve is abnormal. There is mild thickening of the mitral valve leaflet(s). Trivial mitral valve regurgitation.  Tricuspid Valve: The tricuspid valve is grossly normal. Tricuspid valve regurgitation is trivial.  Aortic Valve: The aortic valve is tricuspid. Aortic valve regurgitation is not visualized.  Pulmonic Valve: The pulmonic valve was normal in structure. Pulmonic valve regurgitation is not visualized.  Aorta: Aortic dilatation noted. There is mild dilatation of the ascending aorta, measuring 41 mm.  IAS/Shunts: No atrial level shunt detected by color flow Doppler.   LEFT VENTRICLE PLAX 2D LV EF:  Left            Diastology ventricular     LV e' medial:    4.03 cm/s ejection        LV E/e' medial:  21.1 fraction by     LV e' lateral:   7.62 cm/s PLAX is 42      LV E/e' lateral: 11.1 %. LVIDd:         4.40 cm LVIDs:         3.50 cm LV PW:         1.10 cm LV IVS:        1.40 cm LVOT diam:     2.30 cm LV SV:         83 LV SV Index:   40 LVOT Area:     4.15 cm   RIGHT VENTRICLE RV Basal diam:  3.30 cm RV S prime:     8.49 cm/s TAPSE (M-mode): 1.5 cm RVSP:           21.0 mmHg  LEFT ATRIUM           Index        RIGHT ATRIUM           Index LA diam:      3.90 cm 1.86 cm/m   RA Pressure: 3.00 mmHg LA Vol (A2C): 95.7 ml 45.56 ml/m  RA Area:     17.30 cm LA Vol (A4C): 41.9 ml 19.92 ml/m  RA Volume:   39.50 ml  18.81 ml/m AORTIC VALVE LVOT Vmax:   81.80 cm/s LVOT Vmean:  56.400 cm/s LVOT VTI:    0.200 m  AORTA Ao Root diam: 3.70  cm Ao Asc diam:  4.10 cm  MITRAL VALVE                TRICUSPID VALVE MV Area (PHT):              TR Peak grad:   18.0 mmHg MV Decel Time:              TR Vmax:        212.00 cm/s MV E velocity: 84.90 cm/s   Estimated RAP:  3.00 mmHg MV A velocity: 101.00 cm/s  RVSP:           21.0 mmHg MV E/A ratio:  0.84 SHUNTS Systemic VTI:  0.20 m Systemic Diam: 2.30 cm  Dinah Franco MD Electronically signed by Dinah Franco MD Signature Date/Time: 12/20/2022/9:19:53 PM    Final (Updated)          ______________________________________________________________________________________________      EKG:   EKG Interpretation Date/Time:  Thursday November 16 2023 10:14:04 EDT Ventricular Rate:  65 PR Interval:  208 QRS Duration:  150 QT Interval:  432 QTC Calculation: 449 R Axis:   -66  Text Interpretation: Normal sinus rhythm Left axis deviation Right bundle branch block Minimal voltage criteria for LVH, may be normal variant ( R in aVL ) Inferior infarct , age undetermined Anterior infarct , age undetermined When compared with ECG of 12-Dec-2020 04:02, PREVIOUS ECG IS PRESENT No significant change was found Confirmed by Arnoldo Lapping [phone]) on 11/16/2023 10:31:01 AM    Recent Labs: 12/20/2022: ALT 15; BUN 26; Creatinine, Ser 1.01; Hemoglobin 12.0; Platelets 238; Potassium 4.4; Sodium 137  Recent Lipid Panel    Component Value Date/Time   CHOL 126 12/20/2022 1406   TRIG 87 12/20/2022 1406   TRIG 84 05/05/2006 0839   HDL 32 (L)  12/20/2022 1406   CHOLHDL 3.9 12/20/2022 1406   CHOLHDL 4 05/20/2014 0833   VLDL 12.8 05/20/2014 0833   LDLCALC 77 12/20/2022 1406     Risk Assessment/Calculations:                Physical Exam:    VS:  BP (!) 102/54   Pulse 65   Ht 6' (1.829 m)   Wt 193 lb 12.8 oz (87.9 kg)   SpO2 98%   BMI 26.28 kg/m     Wt Readings from Last 3 Encounters:  11/16/23 193 lb 12.8 oz (87.9 kg)  11/21/22 193 lb 6.4 oz (87.7 kg)  05/04/22 182 lb (82.6 kg)      GEN:  Well nourished, well developed pleasant elderly male in no acute distress HEENT: Normal NECK: No JVD; No carotid bruits LYMPHATICS: No lymphadenopathy CARDIAC: RRR, no murmurs, rubs, gallops RESPIRATORY:  Clear to auscultation without rales, wheezing or rhonchi  ABDOMEN: Soft, non-tender, non-distended MUSCULOSKELETAL: 1+ bilateral pretibial edema; No deformity  SKIN: Warm and dry NEUROLOGIC:  Alert and oriented x 3 PSYCHIATRIC:  Normal affect   Assessment & Plan Essential hypertension Blood pressure is well-controlled.  Treated with carvedilol  and spironolactone . Chronic heart failure with mildly reduced ejection fraction (HFmrEF, 41-49%) (HCC) An echocardiogram from May 2024 was reviewed with LVEF 40 to 45%, mild RV dysfunction with normal RV size and normal estimated PA pressure.  There is no significant valvular disease noted.  Overall these findings have been stable over time.  There is no change in his lower extremity edema on exam today. Coronary artery disease involving native coronary artery of native heart without angina pectoris He remains asymptomatic with respect to his coronary artery disease. Iliac artery aneurysm, bilateral (HCC) Followed by vascular surgery with no intervention planned. Mixed hyperlipidemia Treated with simvastatin  last lipids with cholesterol 126, HDL 32, LDL 77.  Considering his very advanced age of 89 years old, we will keep him on his current medicines which he is tolerating well.            Medication Adjustments/Labs and Tests Ordered: Current medicines are reviewed at length with the patient today.  Concerns regarding medicines are outlined above.  Orders Placed This Encounter  Procedures   EKG 12-Lead   No orders of the defined types were placed in this encounter.   There are no Patient Instructions on file for this visit.   Signed, Arnoldo Lapping, MD  11/16/2023 12:51 PM    [facility]

## 2023-12-07 DIAGNOSIS — Z8546 Personal history of malignant neoplasm of prostate: Secondary | ICD-10-CM | POA: Diagnosis not present

## 2023-12-14 DIAGNOSIS — Z8546 Personal history of malignant neoplasm of prostate: Secondary | ICD-10-CM | POA: Diagnosis not present

## 2023-12-14 DIAGNOSIS — N3041 Irradiation cystitis with hematuria: Secondary | ICD-10-CM | POA: Diagnosis not present

## 2023-12-14 DIAGNOSIS — R350 Frequency of micturition: Secondary | ICD-10-CM | POA: Diagnosis not present

## 2023-12-23 DIAGNOSIS — F32A Depression, unspecified: Secondary | ICD-10-CM | POA: Diagnosis not present

## 2023-12-23 DIAGNOSIS — F413 Other mixed anxiety disorders: Secondary | ICD-10-CM | POA: Diagnosis not present

## 2023-12-23 DIAGNOSIS — F5101 Primary insomnia: Secondary | ICD-10-CM | POA: Diagnosis not present

## 2023-12-28 DIAGNOSIS — E871 Hypo-osmolality and hyponatremia: Secondary | ICD-10-CM | POA: Diagnosis not present

## 2023-12-28 DIAGNOSIS — N390 Urinary tract infection, site not specified: Secondary | ICD-10-CM | POA: Diagnosis not present

## 2023-12-28 DIAGNOSIS — I509 Heart failure, unspecified: Secondary | ICD-10-CM | POA: Diagnosis not present

## 2024-01-02 ENCOUNTER — Other Ambulatory Visit: Payer: Self-pay

## 2024-01-02 ENCOUNTER — Emergency Department (HOSPITAL_COMMUNITY)

## 2024-01-02 ENCOUNTER — Encounter (HOSPITAL_COMMUNITY): Payer: Self-pay | Admitting: Emergency Medicine

## 2024-01-02 ENCOUNTER — Emergency Department (HOSPITAL_COMMUNITY)
Admission: EM | Admit: 2024-01-02 | Discharge: 2024-01-02 | Disposition: A | Discharge status: Home / Self Care | Attending: Emergency Medicine | Admitting: Emergency Medicine

## 2024-01-02 DIAGNOSIS — N390 Urinary tract infection, site not specified: Secondary | ICD-10-CM | POA: Diagnosis not present

## 2024-01-02 DIAGNOSIS — R140 Abdominal distension (gaseous): Secondary | ICD-10-CM | POA: Diagnosis not present

## 2024-01-02 DIAGNOSIS — R110 Nausea: Secondary | ICD-10-CM | POA: Insufficient documentation

## 2024-01-02 DIAGNOSIS — R2243 Localized swelling, mass and lump, lower limb, bilateral: Secondary | ICD-10-CM | POA: Diagnosis not present

## 2024-01-02 DIAGNOSIS — R1030 Lower abdominal pain, unspecified: Secondary | ICD-10-CM | POA: Diagnosis not present

## 2024-01-02 DIAGNOSIS — I714 Abdominal aortic aneurysm, without rupture, unspecified: Secondary | ICD-10-CM | POA: Diagnosis not present

## 2024-01-02 DIAGNOSIS — Z8719 Personal history of other diseases of the digestive system: Secondary | ICD-10-CM | POA: Diagnosis not present

## 2024-01-02 DIAGNOSIS — I723 Aneurysm of iliac artery: Secondary | ICD-10-CM | POA: Diagnosis not present

## 2024-01-02 DIAGNOSIS — I10 Essential (primary) hypertension: Secondary | ICD-10-CM | POA: Diagnosis not present

## 2024-01-02 DIAGNOSIS — Z79899 Other long term (current) drug therapy: Secondary | ICD-10-CM | POA: Insufficient documentation

## 2024-01-02 DIAGNOSIS — R112 Nausea with vomiting, unspecified: Secondary | ICD-10-CM | POA: Diagnosis not present

## 2024-01-02 DIAGNOSIS — K8020 Calculus of gallbladder without cholecystitis without obstruction: Secondary | ICD-10-CM | POA: Diagnosis not present

## 2024-01-02 DIAGNOSIS — I724 Aneurysm of artery of lower extremity: Secondary | ICD-10-CM | POA: Diagnosis not present

## 2024-01-02 DIAGNOSIS — R1915 Other abnormal bowel sounds: Secondary | ICD-10-CM | POA: Diagnosis not present

## 2024-01-02 LAB — URINALYSIS, ROUTINE W REFLEX MICROSCOPIC
Bilirubin Urine: NEGATIVE
Glucose, UA: NEGATIVE mg/dL
Hgb urine dipstick: NEGATIVE
Ketones, ur: NEGATIVE mg/dL
Nitrite: NEGATIVE
Protein, ur: NEGATIVE mg/dL
Specific Gravity, Urine: 1.006 (ref 1.005–1.030)
pH: 7 (ref 5.0–8.0)

## 2024-01-02 LAB — COMPREHENSIVE METABOLIC PANEL WITH GFR
ALT: 17 U/L (ref 0–44)
AST: 23 U/L (ref 15–41)
Albumin: 4.2 g/dL (ref 3.5–5.0)
Alkaline Phosphatase: 54 U/L (ref 38–126)
Anion gap: 10 (ref 5–15)
BUN: 19 mg/dL (ref 8–23)
CO2: 23 mmol/L (ref 22–32)
Calcium: 10.2 mg/dL (ref 8.9–10.3)
Chloride: 103 mmol/L (ref 98–111)
Creatinine, Ser: 0.96 mg/dL (ref 0.61–1.24)
GFR, Estimated: >60 mL/min (ref >60)
Glucose, Bld: 105 mg/dL — ABNORMAL HIGH (ref 70–99)
Potassium: 4.3 mmol/L (ref 3.5–5.1)
Sodium: 136 mmol/L (ref 135–145)
Total Bilirubin: 1.3 mg/dL — ABNORMAL HIGH (ref 0.0–1.2)
Total Protein: 7.4 g/dL (ref 6.5–8.1)

## 2024-01-02 LAB — CBC WITH DIFFERENTIAL/PLATELET
Abs Immature Granulocytes: 0.02 10*3/uL (ref 0.00–0.07)
Basophils Absolute: 0.1 10*3/uL (ref 0.0–0.1)
Basophils Relative: 1 %
Eosinophils Absolute: 0.2 10*3/uL (ref 0.0–0.5)
Eosinophils Relative: 2 %
HCT: 41.9 % (ref 39.0–52.0)
Hemoglobin: 13.3 g/dL (ref 13.0–17.0)
Immature Granulocytes: 0 %
Lymphocytes Relative: 9 %
Lymphs Abs: 0.7 10*3/uL (ref 0.7–4.0)
MCH: 29.2 pg (ref 26.0–34.0)
MCHC: 31.7 g/dL (ref 30.0–36.0)
MCV: 91.9 fL (ref 80.0–100.0)
Monocytes Absolute: 0.6 10*3/uL (ref 0.1–1.0)
Monocytes Relative: 8 %
Neutro Abs: 6 10*3/uL (ref 1.7–7.7)
Neutrophils Relative %: 80 %
Platelets: 264 10*3/uL (ref 150–400)
RBC: 4.56 MIL/uL (ref 4.22–5.81)
RDW: 13.7 % (ref 11.5–15.5)
WBC: 7.5 10*3/uL (ref 4.0–10.5)
nRBC: 0 % (ref 0.0–0.2)

## 2024-01-02 LAB — LIPASE, BLOOD: Lipase: 34 U/L (ref 11–51)

## 2024-01-02 MED ORDER — IOHEXOL 300 MG/ML  SOLN
100.0000 mL | Freq: Once | INTRAMUSCULAR | Status: AC | PRN
  Administered 2024-01-02: 100 mL via INTRAVENOUS

## 2024-01-02 NOTE — ED Triage Notes (Signed)
 Patient presents from [facility] with complaints of nausea and abdominal distension x 1 day. His facility administered 8mg  of Zofran  prior to EMS arrival.   HX bowel obstructions, AAA, 6 by passes and hernia  EMS vitals: 162/100 BP 80 HR 96% SPO2 on room air

## 2024-01-02 NOTE — Discharge Instructions (Addendum)
 1.  At this time, your CT scan does not show any evidence of a bowel obstruction. 2.  Try taking Pepcid twice daily for the next week.  Get over-the-counter MiraLAX  and take this daily until you are having a regular bowel movement.  Once you have regular soft bowel movements you may use as needed. 3. 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 Although this is not the likely explanation for your symptoms, you should follow up with your Vascular doctor for recheck and surveillance as soon as possible. 4.  Return to the emergency department if you develop vomiting, sudden pain, fever or other concerning changes.

## 2024-01-02 NOTE — ED Provider Notes (Signed)
 [place] EMERGENCY DEPARTMENT AT [facility] Provider Note   CSN: [id number] Arrival date & time: 01/02/24  1635     History  Chief Complaint  Patient presents with   Nausea    Jesse [name] is a 89 y.o. male.  HPI Patient reports he feels like he has a bowel obstruction.  Patient reports he has had a bowel obstruction before. This is less painful but the bloating seems similar. He reports that he feels like his abdomen is very distended.  He has had no vomiting.  He ate breakfast this morning.  He reports last bowel movement was 2 days ago.  He reports he felt like he was obstructed 2 days ago but after his bowel movement, things released and the bloating went down.  He reports now his abdomen is very distended again.  He reports is not very painful right now but it was earlier.     Home Medications Prior to Admission medications   Medication Sig Start Date End Date Taking? Authorizing Provider  acetaminophen  (TYLENOL ) 500 MG tablet Take 500 mg by mouth every 6 (six) hours as needed for mild pain or headache.    [provider]  alendronate (FOSAMAX) 70 MG tablet Take 70 mg by mouth every Saturday. 08/14/19   [provider]  carboxymethylcellulose (REFRESH PLUS) 0.5 % SOLN Place 2 drops into both eyes at bedtime.    [provider]  carvedilol  (COREG ) 3.125 MG tablet TAKE 1 TABLET BY MOUTH 2 TIMES DAILY WITH A MEAL 12/25/20   Kraig Peru, MD  fenofibrate  160 MG tablet Take 1 tablet (160 mg total) by mouth daily. 05/29/19   Arnoldo Lapping, MD  finasteride  (PROSCAR ) 5 MG tablet Take 5 mg by mouth daily. 02/10/20   [provider]  Lactobacillus (ACIDOPHILUS PROBIOTIC PO) Take 1 capsule by mouth 2 (two) times daily.    [provider]  melatonin 3 MG TABS tablet Take 3 mg by mouth at bedtime as needed (sleep).    [provider]  pantoprazole  (PROTONIX ) 40 MG tablet Take 40 mg by mouth daily.    [provider]   polyethylene glycol (MIRALAX  / GLYCOLAX ) 17 g packet Take 17 g by mouth daily. 12/26/20   Kraig Peru, MD  sertraline  (ZOLOFT ) 50 MG tablet Take 50 mg by mouth daily. 12/04/20   [provider]  simvastatin  (ZOCOR ) 40 MG tablet Take 1 tablet (40 mg total) by mouth daily at 6 PM. 05/29/19   Arnoldo Lapping, MD  sodium chloride  (OCEAN) 0.65 % SOLN nasal spray Place 2 sprays into both nostrils 2 (two) times daily.    [provider]  spironolactone  (ALDACTONE ) 25 MG tablet Take 12.5 mg by mouth daily. 12/04/20   [provider]  tamsulosin  (FLOMAX ) 0.4 MG CAPS capsule Take 0.4 mg by mouth at bedtime. 02/10/20   [provider]      Allergies    Quinolones and Adhesive [tape]    Review of Systems   Review of Systems  Physical Exam Updated Vital Signs BP 118/80   Pulse 68   Temp 98.3 F (36.8 C) (Oral)   Resp 16   SpO2 100%  Physical Exam Constitutional:      Comments: Alert nontoxic clinically well in appearance.  HENT:     Head: Normocephalic and atraumatic.     Mouth/Throat:     Pharynx: Oropharynx is clear.  Eyes:     Extraocular Movements: Extraocular movements intact.  Cardiovascular:     Rate and Rhythm: Normal rate and regular rhythm.  Pulmonary:     Effort: Pulmonary effort is normal.     Breath sounds: Normal breath sounds.  Abdominal:     Comments: Abdomen is distended.  Soft.  Patient endorses some diffuse lower abdominal discomfort palpation.  Bowel sounds present.  Musculoskeletal:     Comments: 2+ pitting edema bilateral lower extremities.  Skin:    General: Skin is warm and dry.  Neurological:     General: No focal deficit present.     Mental Status: He is oriented to person, place, and time.     Motor: No weakness.     Coordination: Coordination normal.  Psychiatric:        Mood and Affect: Mood normal.     ED Results / Procedures / Treatments   Labs (all labs ordered are listed, but only abnormal results are  displayed) Labs Reviewed  COMPREHENSIVE METABOLIC PANEL WITH GFR - Abnormal; Notable for the following components:      Result Value   Glucose, Bld 105 (*)    Total Bilirubin 1.3 (*)    All other components within normal limits  URINALYSIS, ROUTINE W REFLEX MICROSCOPIC - Abnormal; Notable for the following components:   Leukocytes,Ua LARGE (*)    Bacteria, UA RARE (*)    All other components within normal limits  LIPASE, BLOOD  CBC WITH DIFFERENTIAL/PLATELET    EKG None  Radiology CT ABDOMEN PELVIS W CONTRAST Result Date: 01/02/2024 CLINICAL DATA:  Nausea and abdominal distension. Bowel obstruction suspected. EXAM: CT ABDOMEN AND PELVIS WITH CONTRAST TECHNIQUE: Multidetector CT imaging of the abdomen and pelvis was performed using the standard protocol following bolus administration of intravenous contrast. RADIATION DOSE REDUCTION: This exam was performed according to the departmental dose-optimization program which includes automated exposure control, adjustment of the mA and/or kV according to patient size and/or use of iterative reconstruction technique. CONTRAST:  OMNIPAQUE  IOHEXOL  300 MG/ML  SOLN COMPARISON:  CT 01/22/2021 FINDINGS: Lower chest: Scarring lung bases. 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 No acute abnormality. Hepatobiliary: Hepatic steatosis. Cholelithiasis distended gallbladder. No gallbladder wall thickening or pericholecystic fluid. No biliary dilation. Pancreas: Unremarkable. Spleen: Unremarkable. Adrenals/Urinary Tract: Normal adrenal glands no urinary calculi or hydronephrosis. Nondistended bladder. Stomach/Bowel: Normal caliber large and small bowel without bowel wall thickening. Stomach and appendix are within normal limits. Vascular/Lymphatic: No lymphadenopathy. Aortic atherosclerotic calcification. Postoperative change of abdominal aortic aneurysm repair. The excluded aneurysm sac is increased in size measuring 6.8 x 5.7 cm  4.9 x 4.4 cm on 01/22/2021. 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 Reproductive: Fiducial markers in the prostate bed. Other: No free intraperitoneal fluid or air. Musculoskeletal: IM rod and screw fixation both femurs. Demineralization. IMPRESSION: 1. No acute abnormality in the abdomen or pelvis. No evidence of bowel obstruction. 2. Postoperative change of abdominal aortic aneurysm repair. The excluded aneurysm sac is increased in size measuring 6.8 x 5.7 cm, previously 4.9 x 4.4 cm on 01/22/2021. 3. 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 4. 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 5. 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 6. Cholelithiasis. 7. Hepatic steatosis. 8. Aortic Atherosclerosis (ICD10-I70.0). Electronically Signed   By: Rozell Cornet M.D.   On: 01/02/2024 20:32    Procedures Procedures    Medications Ordered in ED Medications  iohexol  (OMNIPAQUE ) 300 MG/ML solution 100 mL (100 mLs Intravenous Contrast Given 01/02/24 2005)    ED Course/ Medical Decision Making/ A&P                                 Medical Decision Making Amount and/or Complexity of Data Reviewed Labs: ordered. Radiology: ordered.  Risk Prescription drug management.   Patient presents as outlined.  He has abdominal distention and discomfort.  This has been waxing and waning over the past few days.  Patient does have prior history of bowel obstruction and aortic aneurysm repair.  He also reports large anterior wall  hernia.  Currently abdomen is soft and no peritoneal signs.  Patient currently has no pain.  With risk for other etiologies as listed, will proceed with CT abdomen pelvis.  At this time patient does not need pain medication and is alert without any respiratory symptoms or fever.  Lipase 34 comprehensive metabolic panel normal with GFR greater than 60 normal LFTs except total bili 1.3 CBC normal with normal differential urinalysis large leuk esterase 6-10 WBCs rare bacteria   CT Abdo pelvis reviewed by radiology and visually reviewed by myself with help.  Patient has prior aortofemoral bypass repairs with surrounding saccular aneurysm by radiology read there is slight enlargement but graft are stable in place.  No bowel obstruction.  No urinary distention.  No other acute findings.  At this time, with no sign of obstruction or other surgical intra-abdominal etiology, I suspect gastric upset and possibly mild constipation.  Patient does have known aneurysm repairs.  At this time he is pain-free and by description this sounds very unlikely to be due to slight enlargement of the aneurysmal sacs.  This would not account for the abdominal distention that the patient is perceiving which has been waxing and waning.  Patient does have gallstones and gallbladder distention.  He reports this has been very longstanding.  He has no reproducible right upper quadrant pain and pain when present was more of a diffuse floating quality and not localizing or sharp.  We discussed watching for symptoms of gallbladder disease but this appears to be longstanding stable and not likely immediately contributory.  At this time patient stable for discharge.  Return precautions and follow-up plan reviewed.        Final Clinical Impression(s) / ED Diagnoses Final diagnoses:  Abdominal distension    Rx / DC Orders ED Discharge Orders     None         Wynetta Heckle, MD 01/02/24 2316

## 2024-01-02 NOTE — ED Notes (Addendum)
 Urine sample sent to lab

## 2024-01-10 DIAGNOSIS — I5030 Unspecified diastolic (congestive) heart failure: Secondary | ICD-10-CM | POA: Diagnosis not present

## 2024-01-10 DIAGNOSIS — I714 Abdominal aortic aneurysm, without rupture, unspecified: Secondary | ICD-10-CM | POA: Diagnosis not present

## 2024-01-10 DIAGNOSIS — I110 Hypertensive heart disease with heart failure: Secondary | ICD-10-CM | POA: Diagnosis not present

## 2024-01-10 DIAGNOSIS — K439 Ventral hernia without obstruction or gangrene: Secondary | ICD-10-CM | POA: Diagnosis not present

## 2024-02-08 DIAGNOSIS — K080 Exfoliation of teeth due to systemic causes: Secondary | ICD-10-CM | POA: Diagnosis not present

## 2024-04-02 DIAGNOSIS — R21 Rash and other nonspecific skin eruption: Secondary | ICD-10-CM | POA: Diagnosis not present

## 2024-04-04 DIAGNOSIS — M6281 Muscle weakness (generalized): Secondary | ICD-10-CM | POA: Diagnosis not present

## 2024-04-09 DIAGNOSIS — M6281 Muscle weakness (generalized): Secondary | ICD-10-CM | POA: Diagnosis not present

## 2024-04-11 DIAGNOSIS — M6281 Muscle weakness (generalized): Secondary | ICD-10-CM | POA: Diagnosis not present

## 2024-05-07 ENCOUNTER — Emergency Department (HOSPITAL_COMMUNITY)
Admission: EM | Admit: 2024-05-07 | Discharge: 2024-05-07 | Disposition: A | Discharge status: Home / Self Care | Source: Skilled Nursing Facility | Attending: Emergency Medicine | Admitting: Emergency Medicine

## 2024-05-07 ENCOUNTER — Emergency Department (HOSPITAL_COMMUNITY)

## 2024-05-07 DIAGNOSIS — W19XXXA Unspecified fall, initial encounter: Secondary | ICD-10-CM

## 2024-05-07 DIAGNOSIS — Z8546 Personal history of malignant neoplasm of prostate: Secondary | ICD-10-CM | POA: Insufficient documentation

## 2024-05-07 DIAGNOSIS — S299XXA Unspecified injury of thorax, initial encounter: Secondary | ICD-10-CM | POA: Diagnosis present

## 2024-05-07 DIAGNOSIS — W1830XA Fall on same level, unspecified, initial encounter: Secondary | ICD-10-CM | POA: Insufficient documentation

## 2024-05-07 DIAGNOSIS — I77810 Thoracic aortic ectasia: Secondary | ICD-10-CM | POA: Insufficient documentation

## 2024-05-07 DIAGNOSIS — S2241XA Multiple fractures of ribs, right side, initial encounter for closed fracture: Secondary | ICD-10-CM | POA: Diagnosis not present

## 2024-05-07 DIAGNOSIS — Z79899 Other long term (current) drug therapy: Secondary | ICD-10-CM | POA: Insufficient documentation

## 2024-05-07 DIAGNOSIS — Z8616 Personal history of COVID-19: Secondary | ICD-10-CM | POA: Diagnosis not present

## 2024-05-07 DIAGNOSIS — I10 Essential (primary) hypertension: Secondary | ICD-10-CM | POA: Diagnosis not present

## 2024-05-07 DIAGNOSIS — S0990XA Unspecified injury of head, initial encounter: Secondary | ICD-10-CM | POA: Diagnosis not present

## 2024-05-07 DIAGNOSIS — I6789 Other cerebrovascular disease: Secondary | ICD-10-CM | POA: Insufficient documentation

## 2024-05-07 DIAGNOSIS — I723 Aneurysm of iliac artery: Secondary | ICD-10-CM | POA: Insufficient documentation

## 2024-05-07 DIAGNOSIS — Z87891 Personal history of nicotine dependence: Secondary | ICD-10-CM | POA: Insufficient documentation

## 2024-05-07 DIAGNOSIS — R270 Ataxia, unspecified: Secondary | ICD-10-CM | POA: Diagnosis not present

## 2024-05-07 DIAGNOSIS — I2510 Atherosclerotic heart disease of native coronary artery without angina pectoris: Secondary | ICD-10-CM | POA: Diagnosis not present

## 2024-05-07 DIAGNOSIS — I714 Abdominal aortic aneurysm, without rupture, unspecified: Secondary | ICD-10-CM | POA: Diagnosis not present

## 2024-05-07 LAB — BASIC METABOLIC PANEL WITH GFR
Anion gap: 10 (ref 5–15)
BUN: 26 mg/dL — ABNORMAL HIGH (ref 8–23)
CO2: 25 mmol/L (ref 22–32)
Calcium: 9.9 mg/dL (ref 8.9–10.3)
Chloride: 103 mmol/L (ref 98–111)
Creatinine, Ser: 0.83 mg/dL (ref 0.61–1.24)
GFR, Estimated: >60 mL/min (ref >60)
Glucose, Bld: 105 mg/dL — ABNORMAL HIGH (ref 70–99)
Potassium: 4.2 mmol/L (ref 3.5–5.1)
Sodium: 138 mmol/L (ref 135–145)

## 2024-05-07 LAB — CBC WITH DIFFERENTIAL/PLATELET
Abs Immature Granulocytes: 0.03 K/uL (ref 0.00–0.07)
Basophils Absolute: 0.1 K/uL (ref 0.0–0.1)
Basophils Relative: 1 %
Eosinophils Absolute: 0.1 K/uL (ref 0.0–0.5)
Eosinophils Relative: 1 %
HCT: 38.9 % — ABNORMAL LOW (ref 39.0–52.0)
Hemoglobin: 12 g/dL — ABNORMAL LOW (ref 13.0–17.0)
Immature Granulocytes: 0 %
Lymphocytes Relative: 5 %
Lymphs Abs: 0.5 K/uL — ABNORMAL LOW (ref 0.7–4.0)
MCH: 28.4 pg (ref 26.0–34.0)
MCHC: 30.8 g/dL (ref 30.0–36.0)
MCV: 92.2 fL (ref 80.0–100.0)
Monocytes Absolute: 0.7 K/uL (ref 0.1–1.0)
Monocytes Relative: 8 %
Neutro Abs: 7.3 K/uL (ref 1.7–7.7)
Neutrophils Relative %: 85 %
Platelets: 218 K/uL (ref 150–400)
RBC: 4.22 MIL/uL (ref 4.22–5.81)
RDW: 14 % (ref 11.5–15.5)
WBC: 8.6 K/uL (ref 4.0–10.5)
nRBC: 0 % (ref 0.0–0.2)

## 2024-05-07 MED ORDER — IOHEXOL 300 MG/ML  SOLN
100.0000 mL | Freq: Once | INTRAMUSCULAR | Status: AC | PRN
  Administered 2024-05-07: 100 mL via INTRAVENOUS

## 2024-05-07 MED ORDER — GABAPENTIN 100 MG PO CAPS: 100.0000 mg | ORAL_CAPSULE | Freq: Two times a day (BID) | ORAL | 0 refills | Status: AC

## 2024-05-07 MED ORDER — ACETAMINOPHEN 500 MG PO TABS
1000.0000 mg | ORAL_TABLET | Freq: Once | ORAL | Status: AC
  Administered 2024-05-07: 1000 mg via ORAL
  Filled 2024-05-07: qty 2

## 2024-05-07 NOTE — ED Triage Notes (Signed)
 BIB EMS for right sided rib pain due to fall last Thursday. 50 mcg of fentaly with EMS, pt denies being on blood thinners.  100/66 HR 70

## 2024-05-07 NOTE — ED Provider Notes (Addendum)
 [place] EMERGENCY DEPARTMENT AT [facility] Provider Note  CSN: [id number] Arrival date & time: 05/07/24 1114  Chief Complaint(s) Fall  HPI Jesse [name] is a 89 y.o. male here today after he had a fall on Thursday of this past week.  He states that he was throwing some trash into the large trash bin outside, did not realize it was on wheels and he lost his balance falling down striking the right side of his chest.  He has been having some pain on that right side since.  He is not on blood thinners.  Ambulates with a walker, has been walking more tenderly since.   Past Medical History Past Medical History:  Diagnosis Date   AAA (abdominal aortic aneurysm)    AKI (acute kidney injury) 05/05/2018   Arthritis    Cancer (HCC) 2010   prostate ( 40 Txs. of radiation treatments)   Coronary artery disease    COVID-19 virus infection 06/19/2019   Displaced spiral fracture of shaft of right femur, initial encounter for open fracture type I or II (HCC) 06/20/2019   Duodenal ulcer with hemorrhage 05/05/2018   Environmental allergies    GERD (gastroesophageal reflux disease)    GI bleed 05/04/2018   Hemorrhagic shock (HCC) 05/05/2018   Hip fracture (HCC) 04/15/2019   History of prostate cancer 2010   HOH (hard of hearing)    wears bilateral hearing aids   Hyperlipidemia    Hypertension    Kidney stone 1968   Melena 05/04/2018   Myocardial infarction (HCC) 1992   Patella fracture 06/09/2015   Pneumonia    hx of   Pneumonia due to COVID-19 virus 06/20/2019   RBBB    SBO (small bowel obstruction) (HCC) 12/12/2020   Patient Active Problem List   Diagnosis Date Noted   At risk for falling 08/10/2022   Palliative care encounter 05/04/2022   At risk for excessive bleeding 05/04/2022   Hematuria 02/19/2021   GERD without esophagitis 02/15/2021   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   Gross hematuria 01/23/2021   Palliative care by specialist    Abducens (sixth) nerve palsy,  left 02/20/2020   Binocular vision disorder with diplopia 02/20/2020   History of prostate cancer 08/15/2019   Anxiety 06/21/2019   Anemia due to chronic illness 06/20/2019   Goals of care, counseling/discussion    Fall    Coronary artery disease involving native coronary artery of native heart without angina pectoris 11/20/2018   Thoracic aortic aneurysm without rupture 11/20/2018   Chronic systolic heart failure (HCC) 11/20/2018   Orthostatic hypotension 05/04/2018   Anemia associated with acute blood loss 05/04/2018   Aftercare following surgery of the circulatory system, NEC 01/08/2013   Pain in limb- Left popliteal 12/25/2012   Aneurysm artery, femoral 05/22/2012   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 Authorizing Provider  acetaminophen  (TYLENOL ) 500 MG tablet Take 500 mg by mouth every 6 (six) hours as needed for mild pain or headache.    [provider]  alendronate (FOSAMAX) 70 MG tablet Take 70 mg by  mouth every Saturday. 08/14/19   [provider]  carboxymethylcellulose (REFRESH PLUS) 0.5 % SOLN Place 2 drops into both eyes at bedtime.    [provider]  carvedilol  (COREG ) 3.125 MG tablet TAKE 1 TABLET BY MOUTH 2 TIMES DAILY WITH A MEAL 12/25/20   Tobie Yetta HERO, MD  fenofibrate  160 MG tablet Take 1 tablet (160 mg total) by mouth daily. 05/29/19   Wonda Sharper, MD  finasteride  (PROSCAR ) 5 MG tablet Take 5 mg by mouth daily. 02/10/20   [provider]  Lactobacillus  (ACIDOPHILUS PROBIOTIC PO) Take 1 capsule by mouth 2 (two) times daily.    [provider]  melatonin 3 MG TABS tablet Take 3 mg by mouth at bedtime as needed (sleep).    [provider]  pantoprazole  (PROTONIX ) 40 MG tablet Take 40 mg by mouth daily.    [provider]  polyethylene glycol (MIRALAX  / GLYCOLAX ) 17 g packet Take 17 g by mouth daily. 12/26/20   Tobie Yetta HERO, MD  sertraline  (ZOLOFT ) 50 MG tablet Take 50 mg by mouth daily. 12/04/20   [provider]  simvastatin  (ZOCOR ) 40 MG tablet Take 1 tablet (40 mg total) by mouth daily at 6 PM. 05/29/19   Wonda Sharper, MD  sodium chloride  (OCEAN) 0.65 % SOLN nasal spray Place 2 sprays into both nostrils 2 (two) times daily.    [provider]  spironolactone  (ALDACTONE ) 25 MG tablet Take 12.5 mg by mouth daily. 12/04/20   [provider]  tamsulosin  (FLOMAX ) 0.4 MG CAPS capsule Take 0.4 mg by mouth at bedtime. 02/10/20   [provider]                                                                                                                                    Past Surgical History Past Surgical History:  Procedure Laterality Date   ABDOMINAL AORTIC ANEURYSM REPAIR  08-13-1993   Dr Cordella Hint   CARDIAC CATHETERIZATION     COLONOSCOPY W/ POLYPECTOMY     CORONARY ARTERY BYPASS GRAFT  03-01-1991   Dr. Army   CYSTOSCOPY WITH FULGERATION N/A 01/23/2021   Procedure: CYSTOSCOPY WITH FULGERATION;  Surgeon: Carolee Sherwood JONETTA DOUGLAS, MD;  Location: [facility];  Service: Urology;  Laterality: N/A;   ESOPHAGOGASTRODUODENOSCOPY (EGD) WITH PROPOFOL  N/A 05/04/2018   Procedure: ESOPHAGOGASTRODUODENOSCOPY (EGD) WITH PROPOFOL ;  Surgeon: Saintclair Jasper, MD;  Location: [facility];  Service: Gastroenterology;  Laterality: N/A;   EYE SURGERY     Detached retina (Left eye); bilateral cataract removal   FALSE ANEURYSM REPAIR  05/07/2012   Procedure: REPAIR FALSE ANEURYSM;  Surgeon: Krystal JULIANNA Doing, MD;   Location: [facility] OR;  Service: Vascular;  Laterality: Left;  Repair of Left Femoral Artery Aneurysm   HERNIA REPAIR     HOT HEMOSTASIS N/A 05/04/2018   Procedure: HOT HEMOSTASIS (ARGON PLASMA COAGULATION/BICAP);  Surgeon: Saintclair Jasper, MD;  Location: [facility];  Service: Gastroenterology;  Laterality: N/A;   INTRAMEDULLARY (IM) NAIL INTERTROCHANTERIC Left 04/16/2019   Procedure: INTRAMEDULLARY (IM) NAIL INTERTROCHANTRIC;  Surgeon: Fidel Rogue, MD;  Location: [facility];  Service: Orthopedics;  Laterality: Left;   INTRAMEDULLARY (IM) NAIL INTERTROCHANTERIC Right 06/17/2019   Procedure: RIGHT INTRAMEDULLARY (IM) NAIL INTERTROCHANTRIC;  Surgeon: Fidel Rogue, MD;  Location: [facility];  Service: Orthopedics;  Laterality: Right;   IR NASO G TUBE PLC W/FL W/RAD  12/15/2020   ORIF PATELLA Right 06/09/2015   Procedure: OPEN REDUCTION INTERNAL (ORIF) FIXATION PATELLA;  Surgeon: Oneil JAYSON Herald, MD;  Location: [facility];  Service: Orthopedics;  Laterality: Right;   repair of bowel obstruction  1999   Dr. Gail   repair of ventral hernia  04-20-1994   P. Cordella Hint MD   resection femoral artery  03/19/2012   Dr. Krystal Early   SUBMUCOSAL INJECTION  05/04/2018   Procedure: SUBMUCOSAL INJECTION;  Surgeon: Saintclair Jasper, MD;  Location: [facility];  Service: Gastroenterology;;   TRANSURETHRAL RESECTION OF BLADDER TUMOR N/A 08/18/2019   Procedure: cysto fulgeration clot evacuation;  Surgeon: Sherrilee Belvie CROME, MD;  Location: [facility];  Service: Urology;  Laterality: N/A;   Family History Family History  Problem Relation Age of Onset   Cancer Mother    Cancer Father    Varicose Veins Father    Cancer Sister    Diabetes Sister    Heart attack Sister     Social History Social History   Tobacco Use   Smoking status: Former    Current packs/day: 0.00    Types: Cigarettes    Start date: 07/25/1948    Quit date: 07/25/1968    Years since quitting: 55.8   Smokeless tobacco: Never  Vaping Use   Vaping status:  Never Used  Substance Use Topics   Alcohol  use: No   Drug use: No   Allergies Quinolones and Adhesive [tape]  Review of Systems Review of Systems  Physical Exam Vital Signs  I have reviewed the triage vital signs BP 114/77 (BP Location: Left Arm)   Pulse 65   Temp 98.1 F (36.7 C) (Oral)   Resp 12   SpO2 97%   Physical Exam Vitals and nursing note reviewed.  HENT:     Head: Normocephalic.     Mouth/Throat:     Mouth: Mucous membranes are moist.  Eyes:     Pupils: Pupils are equal, round, and reactive to light.  Cardiovascular:     Rate and Rhythm: Normal rate.  Pulmonary:     Effort: Pulmonary effort is normal.  Abdominal:     General: Abdomen is flat.     Palpations: Abdomen is soft.     Tenderness: There is no abdominal tenderness.     Comments: Diastases rectus  Musculoskeletal:        General: No swelling or deformity. Normal range of motion.     Comments: No tenderness to palpation in the bilateral shoulders, upper arms, elbows, forearms or wrists.  No tenderness to palpation in the chest.  Pelvis stable, nontender.  No tenderness, deformities noted on bilateral upper legs, knees, lower legs or ankles.  Patient able to lift both legs from the bed.  Neurological:     General: No focal deficit present.     Mental Status: He is alert.     ED Results and Treatments Labs (all labs ordered are listed, but only abnormal results are displayed) Labs Reviewed  BASIC METABOLIC PANEL WITH GFR - Abnormal; Notable  for the following components:      Result Value   Glucose, Bld 105 (*)    BUN 26 (*)    All other components within normal limits  CBC WITH DIFFERENTIAL/PLATELET - Abnormal; Notable for the following components:   Hemoglobin 12.0 (*)    HCT 38.9 (*)    Lymphs Abs 0.5 (*)    All other components within normal limits                                                                                                                          Radiology No  results found.  Pertinent labs & imaging results that were available during my care of the patient were reviewed by me and considered in my medical decision making (see MDM for details).  Medications Ordered in ED Medications  iohexol  (OMNIPAQUE ) 300 MG/ML solution 100 mL (100 mLs Intravenous Contrast Given 05/07/24 1249)                                                                                                                                     Procedures Procedures  (including critical care time)  Medical Decision Making / ED Course   This patient presents to the ED for concern of nonsyncopal fall from standing, this involves an extensive number of treatment options, and is a complaint that carries with it a high risk of complications and morbidity.  The differential diagnosis includes chest wall contusion, rib fracture, pulmonary contusion, intra-abdominal injury.  MDM: On exam, patient overall looks well.  Given his age, will obtain imaging of head, neck, chest abdomen and pelvis.  CBC and BMP ordered.  Patient with history of bypass surgery, no chest pain.  Will obtain an EKG.  Reassessment 2:50 PM-patient's hemoglobin at baseline, renal function normal.    Reassessment 3:15 PM-patient CT imaging has returned and shows mildly displaced rib fractures on the right. Known aortic aneurysms. Patient feeling a bit better, would like to go back to his assisted living facility.  Patient not having any shortness of breath, no difficulty with deep inspiration.  Will discharge with some Tylenol .   Additional history obtained: -Additional history obtained from EMS -External records from outside source obtained and reviewed including: Chart review including previous notes, labs, imaging, consultation notes   Lab Tests: -I ordered, reviewed, and interpreted labs.   The pertinent results include:   Labs  Reviewed  BASIC METABOLIC PANEL WITH GFR - Abnormal; Notable for the following  components:      Result Value   Glucose, Bld 105 (*)    BUN 26 (*)    All other components within normal limits  CBC WITH DIFFERENTIAL/PLATELET - Abnormal; Notable for the following components:   Hemoglobin 12.0 (*)    HCT 38.9 (*)    Lymphs Abs 0.5 (*)    All other components within normal limits      EKG intraventricular conduction delay, no acute ischemia.  EKG Interpretation Date/Time:  Tuesday May 07 2024 11:36:42 EDT Ventricular Rate:  63 PR Interval:  201 QRS Duration:  148 QT Interval:  468 QTC Calculation: 480 R Axis:   -62  Text Interpretation: Sinus rhythm IVCD, consider atypical RBBB Left ventricular hypertrophy Inferior infarct, old Anterolateral infarct, age indeterminate Confirmed by Mannie Pac [phone]) on 05/07/2024 2:05:45 PM         Medicines ordered and prescription drug management: Meds ordered this encounter  Medications   iohexol  (OMNIPAQUE ) 300 MG/ML solution 100 mL    -I have reviewed the patients home medicines and have made adjustments as needed    Cardiac Monitoring: The patient was maintained on a cardiac monitor.  I personally viewed and interpreted the cardiac monitored which showed an underlying rhythm of: Normal sinus rhythm  Social Determinants of Health:  Factors impacting patients care include: Lack of access to primary care   Reevaluation: After the interventions noted above, I reevaluated the patient and found that they have :improved  Co morbidities that complicate the patient evaluation  Past Medical History:  Diagnosis Date   AAA (abdominal aortic aneurysm)    AKI (acute kidney injury) 05/05/2018   Arthritis    Cancer (HCC) 2010   prostate ( 40 Txs. of radiation treatments)   Coronary artery disease    COVID-19 virus infection 06/19/2019   Displaced spiral fracture of shaft of right femur, initial encounter for open fracture type I or II (HCC) 06/20/2019   Duodenal ulcer with hemorrhage 05/05/2018    Environmental allergies    GERD (gastroesophageal reflux disease)    GI bleed 05/04/2018   Hemorrhagic shock (HCC) 05/05/2018   Hip fracture (HCC) 04/15/2019   History of prostate cancer 2010   HOH (hard of hearing)    wears bilateral hearing aids   Hyperlipidemia    Hypertension    Kidney stone 1968   Melena 05/04/2018   Myocardial infarction (HCC) 1992   Patella fracture 06/09/2015   Pneumonia    hx of   Pneumonia due to COVID-19 virus 06/20/2019   RBBB    SBO (small bowel obstruction) (HCC) 12/12/2020        Final Clinical Impression(s) / ED Diagnoses Final diagnoses:  Fall, initial encounter     @PCDICTATION @    Mannie Pac T, DO 05/07/24 1452    Mannie Pac T, DO 05/07/24 1520

## 2024-05-07 NOTE — Discharge Instructions (Addendum)
 While you were in the emergency room, you had CT scans done that did show you fractured your 7th, 8th and 10th rib on the right.  You already know about your aneurysm that you have had surgery on.  It does show a small increase in the size of the aneurysm but this is expected.  Please follow-up with your vascular surgeon at your next scheduled appointment.  You may take 1000 mg of Tylenol  every 8 hours for pain.  I have also sent you a prescription for a medicine called gabapentin that you can take 2 times per day if the Tylenol  is not helping enough.  You may also take 400 mg of ibuprofen every 6 hours.  Please call your primary care doctor tomorrow for follow-up appointment.  Return to the emergency room if you develop difficulty with your breathing, fever or cough.

## 2024-05-14 DIAGNOSIS — N390 Urinary tract infection, site not specified: Secondary | ICD-10-CM | POA: Diagnosis not present

## 2024-05-22 DIAGNOSIS — F5101 Primary insomnia: Secondary | ICD-10-CM | POA: Diagnosis not present

## 2024-05-22 DIAGNOSIS — F413 Other mixed anxiety disorders: Secondary | ICD-10-CM | POA: Diagnosis not present

## 2024-05-22 DIAGNOSIS — F338 Other recurrent depressive disorders: Secondary | ICD-10-CM | POA: Diagnosis not present

## 2024-06-11 DIAGNOSIS — L209 Atopic dermatitis, unspecified: Secondary | ICD-10-CM | POA: Diagnosis not present

## 2024-07-11 DIAGNOSIS — M6281 Muscle weakness (generalized): Secondary | ICD-10-CM | POA: Diagnosis not present
